# Patient Record
Sex: Female | Born: 1945 | Race: Black or African American | Hispanic: No | Marital: Married | Smoking: Former smoker
Health system: Southern US, Community
[De-identification: ages and names within clinical notes are randomized; demographics above are authoritative.]

## PROBLEM LIST (undated history)

## (undated) DIAGNOSIS — F209 Schizophrenia, unspecified: Secondary | ICD-10-CM

## (undated) DIAGNOSIS — C50919 Malignant neoplasm of unspecified site of unspecified female breast: Secondary | ICD-10-CM

## (undated) DIAGNOSIS — R499 Unspecified voice and resonance disorder: Secondary | ICD-10-CM

## (undated) DIAGNOSIS — F419 Anxiety disorder, unspecified: Secondary | ICD-10-CM

## (undated) DIAGNOSIS — I639 Cerebral infarction, unspecified: Secondary | ICD-10-CM

## (undated) DIAGNOSIS — F0390 Unspecified dementia without behavioral disturbance: Secondary | ICD-10-CM

## (undated) DIAGNOSIS — R251 Tremor, unspecified: Secondary | ICD-10-CM

## (undated) DIAGNOSIS — R51 Headache: Secondary | ICD-10-CM

## (undated) DIAGNOSIS — I2510 Atherosclerotic heart disease of native coronary artery without angina pectoris: Secondary | ICD-10-CM

## (undated) DIAGNOSIS — S2249XA Multiple fractures of ribs, unspecified side, initial encounter for closed fracture: Secondary | ICD-10-CM

## (undated) DIAGNOSIS — M2550 Pain in unspecified joint: Secondary | ICD-10-CM

## (undated) DIAGNOSIS — I10 Essential (primary) hypertension: Secondary | ICD-10-CM

## (undated) DIAGNOSIS — R1310 Dysphagia, unspecified: Secondary | ICD-10-CM

## (undated) DIAGNOSIS — J449 Chronic obstructive pulmonary disease, unspecified: Secondary | ICD-10-CM

## (undated) DIAGNOSIS — R0602 Shortness of breath: Secondary | ICD-10-CM

## (undated) DIAGNOSIS — F32A Depression, unspecified: Secondary | ICD-10-CM

## (undated) DIAGNOSIS — F1910 Other psychoactive substance abuse, uncomplicated: Secondary | ICD-10-CM

## (undated) DIAGNOSIS — G4733 Obstructive sleep apnea (adult) (pediatric): Secondary | ICD-10-CM

## (undated) DIAGNOSIS — S2239XA Fracture of one rib, unspecified side, initial encounter for closed fracture: Secondary | ICD-10-CM

## (undated) DIAGNOSIS — I509 Heart failure, unspecified: Secondary | ICD-10-CM

## (undated) DIAGNOSIS — R634 Abnormal weight loss: Secondary | ICD-10-CM

## (undated) DIAGNOSIS — R569 Unspecified convulsions: Secondary | ICD-10-CM

## (undated) DIAGNOSIS — I219 Acute myocardial infarction, unspecified: Secondary | ICD-10-CM

## (undated) DIAGNOSIS — F329 Major depressive disorder, single episode, unspecified: Secondary | ICD-10-CM

## (undated) HISTORY — DX: Tremor, unspecified: R25.1

## (undated) HISTORY — DX: Schizophrenia, unspecified: F20.9

## (undated) HISTORY — DX: Fracture of one rib, unspecified side, initial encounter for closed fracture: S22.39XA

## (undated) HISTORY — DX: Other psychoactive substance abuse, uncomplicated: F19.10

## (undated) HISTORY — DX: Malignant neoplasm of unspecified site of unspecified female breast: C50.919

## (undated) HISTORY — DX: Cerebral infarction, unspecified: I63.9

## (undated) HISTORY — DX: Atherosclerotic heart disease of native coronary artery without angina pectoris: I25.10

## (undated) HISTORY — DX: Abnormal weight loss: R63.4

## (undated) HISTORY — DX: Anxiety disorder, unspecified: F41.9

## (undated) HISTORY — PX: TOTAL ABDOMINAL HYSTERECTOMY: SHX209

## (undated) HISTORY — DX: Obstructive sleep apnea (adult) (pediatric): G47.33

## (undated) HISTORY — DX: Pain in unspecified joint: M25.50

## (undated) HISTORY — DX: Chronic obstructive pulmonary disease, unspecified: J44.9

## (undated) HISTORY — DX: Multiple fractures of ribs, unspecified side, initial encounter for closed fracture: S22.49XA

## (undated) HISTORY — DX: Dysphagia, unspecified: R13.10

## (undated) HISTORY — DX: Unspecified voice and resonance disorder: R49.9

## (undated) HISTORY — DX: Unspecified dementia, unspecified severity, without behavioral disturbance, psychotic disturbance, mood disturbance, and anxiety: F03.90

## (undated) HISTORY — DX: Acute myocardial infarction, unspecified: I21.9

## (undated) HISTORY — DX: Essential (primary) hypertension: I10

---

## 1997-08-28 ENCOUNTER — Emergency Department (HOSPITAL_COMMUNITY): Admission: EM | Admit: 1997-08-28 | Discharge: 1997-08-28 | Payer: Self-pay | Admitting: Emergency Medicine

## 1997-09-05 ENCOUNTER — Emergency Department (HOSPITAL_COMMUNITY): Admission: EM | Admit: 1997-09-05 | Discharge: 1997-09-05 | Payer: Self-pay | Admitting: Emergency Medicine

## 2008-10-05 ENCOUNTER — Emergency Department (HOSPITAL_COMMUNITY): Admission: EM | Admit: 2008-10-05 | Discharge: 2008-10-05 | Payer: Self-pay | Admitting: Emergency Medicine

## 2008-11-11 ENCOUNTER — Emergency Department (HOSPITAL_COMMUNITY): Admission: EM | Admit: 2008-11-11 | Discharge: 2008-11-11 | Payer: Self-pay | Admitting: Emergency Medicine

## 2008-11-12 ENCOUNTER — Inpatient Hospital Stay (HOSPITAL_COMMUNITY): Admission: EM | Admit: 2008-11-12 | Discharge: 2008-11-19 | Payer: Self-pay | Admitting: Emergency Medicine

## 2008-11-13 ENCOUNTER — Encounter (INDEPENDENT_AMBULATORY_CARE_PROVIDER_SITE_OTHER): Payer: Self-pay | Admitting: Internal Medicine

## 2008-11-18 ENCOUNTER — Encounter: Payer: Self-pay | Admitting: Cardiovascular Disease

## 2009-01-16 ENCOUNTER — Inpatient Hospital Stay (HOSPITAL_COMMUNITY): Admission: AD | Admit: 2009-01-16 | Discharge: 2009-01-21 | Payer: Self-pay | Admitting: Cardiology

## 2009-01-16 ENCOUNTER — Encounter: Payer: Self-pay | Admitting: Emergency Medicine

## 2009-01-21 ENCOUNTER — Ambulatory Visit: Payer: Self-pay | Admitting: Psychiatry

## 2009-01-21 ENCOUNTER — Inpatient Hospital Stay (HOSPITAL_COMMUNITY): Admission: AD | Admit: 2009-01-21 | Discharge: 2009-01-23 | Payer: Self-pay | Admitting: Psychiatry

## 2009-02-03 ENCOUNTER — Encounter (INDEPENDENT_AMBULATORY_CARE_PROVIDER_SITE_OTHER): Payer: Self-pay | Admitting: Nurse Practitioner

## 2009-02-03 ENCOUNTER — Encounter (INDEPENDENT_AMBULATORY_CARE_PROVIDER_SITE_OTHER): Payer: Self-pay | Admitting: Cardiology

## 2009-02-03 ENCOUNTER — Inpatient Hospital Stay (HOSPITAL_COMMUNITY): Admission: EM | Admit: 2009-02-03 | Discharge: 2009-02-07 | Payer: Self-pay | Admitting: Emergency Medicine

## 2009-03-21 ENCOUNTER — Inpatient Hospital Stay (HOSPITAL_COMMUNITY): Admission: EM | Admit: 2009-03-21 | Discharge: 2009-03-23 | Payer: Self-pay | Admitting: Emergency Medicine

## 2009-03-31 DIAGNOSIS — I2510 Atherosclerotic heart disease of native coronary artery without angina pectoris: Secondary | ICD-10-CM | POA: Insufficient documentation

## 2009-04-01 ENCOUNTER — Inpatient Hospital Stay (HOSPITAL_COMMUNITY): Admission: EM | Admit: 2009-04-01 | Discharge: 2009-04-03 | Payer: Self-pay | Admitting: Emergency Medicine

## 2009-04-01 LAB — CONVERTED CEMR LAB
BUN: 22 mg/dL
CO2: 25 meq/L
Calcium: 8.5 mg/dL
Chloride: 108 meq/L
Glucose, Bld: 111 mg/dL
HCT: 36.7 %
Hemoglobin: 12.5 g/dL
Potassium: 3.6 meq/L
WBC: 4.7 10*3/uL

## 2009-04-06 ENCOUNTER — Encounter (INDEPENDENT_AMBULATORY_CARE_PROVIDER_SITE_OTHER): Payer: Self-pay | Admitting: Nurse Practitioner

## 2009-04-06 ENCOUNTER — Ambulatory Visit: Payer: Self-pay | Admitting: Internal Medicine

## 2009-04-06 DIAGNOSIS — E785 Hyperlipidemia, unspecified: Secondary | ICD-10-CM

## 2009-04-06 DIAGNOSIS — F411 Generalized anxiety disorder: Secondary | ICD-10-CM | POA: Insufficient documentation

## 2009-04-06 DIAGNOSIS — I219 Acute myocardial infarction, unspecified: Secondary | ICD-10-CM | POA: Insufficient documentation

## 2009-04-06 DIAGNOSIS — K8020 Calculus of gallbladder without cholecystitis without obstruction: Secondary | ICD-10-CM | POA: Insufficient documentation

## 2009-04-06 DIAGNOSIS — J45909 Unspecified asthma, uncomplicated: Secondary | ICD-10-CM | POA: Insufficient documentation

## 2009-04-06 DIAGNOSIS — J4489 Other specified chronic obstructive pulmonary disease: Secondary | ICD-10-CM | POA: Insufficient documentation

## 2009-04-06 DIAGNOSIS — N179 Acute kidney failure, unspecified: Secondary | ICD-10-CM

## 2009-04-06 DIAGNOSIS — F1410 Cocaine abuse, uncomplicated: Secondary | ICD-10-CM | POA: Insufficient documentation

## 2009-04-06 DIAGNOSIS — I10 Essential (primary) hypertension: Secondary | ICD-10-CM

## 2009-04-06 DIAGNOSIS — F209 Schizophrenia, unspecified: Secondary | ICD-10-CM | POA: Insufficient documentation

## 2009-04-06 DIAGNOSIS — J449 Chronic obstructive pulmonary disease, unspecified: Secondary | ICD-10-CM

## 2009-04-13 DIAGNOSIS — K5900 Constipation, unspecified: Secondary | ICD-10-CM | POA: Insufficient documentation

## 2009-04-14 LAB — CONVERTED CEMR LAB
Albumin: 4.4 g/dL (ref 3.5–5.2)
Alkaline Phosphatase: 66 units/L (ref 39–117)
BUN: 15 mg/dL (ref 6–23)
CO2: 26 meq/L (ref 19–32)
Glucose, Bld: 73 mg/dL (ref 70–99)
Hemoglobin: 12.7 g/dL (ref 12.0–15.0)
MCHC: 31.6 g/dL (ref 30.0–36.0)
MCV: 97.6 fL (ref 78.0–100.0)
Microalb, Ur: 5.3 mg/dL — ABNORMAL HIGH (ref 0.00–1.89)
Potassium: 4.2 meq/L (ref 3.5–5.3)
RBC: 4.12 M/uL (ref 3.87–5.11)
Total Bilirubin: 0.4 mg/dL (ref 0.3–1.2)

## 2009-05-13 ENCOUNTER — Ambulatory Visit: Payer: Self-pay | Admitting: Nurse Practitioner

## 2009-05-14 ENCOUNTER — Encounter (INDEPENDENT_AMBULATORY_CARE_PROVIDER_SITE_OTHER): Payer: Self-pay | Admitting: Nurse Practitioner

## 2009-05-14 DIAGNOSIS — Z862 Personal history of diseases of the blood and blood-forming organs and certain disorders involving the immune mechanism: Secondary | ICD-10-CM

## 2009-05-14 DIAGNOSIS — Z8639 Personal history of other endocrine, nutritional and metabolic disease: Secondary | ICD-10-CM

## 2009-05-14 LAB — CONVERTED CEMR LAB
ALT: 20 units/L (ref 0–35)
AST: 41 units/L — ABNORMAL HIGH (ref 0–37)
Albumin: 4.1 g/dL (ref 3.5–5.2)
CO2: 22 meq/L (ref 19–32)
Calcium: 9.3 mg/dL (ref 8.4–10.5)
Chloride: 105 meq/L (ref 96–112)
Cholesterol: 198 mg/dL (ref 0–200)
Eosinophils Absolute: 0.1 10*3/uL (ref 0.0–0.7)
Lymphocytes Relative: 18 % (ref 12–46)
Lymphs Abs: 0.8 10*3/uL (ref 0.7–4.0)
MCV: 96.1 fL (ref 78.0–100.0)
Monocytes Relative: 10 % (ref 3–12)
Neutrophils Relative %: 70 % (ref 43–77)
Potassium: 4.6 meq/L (ref 3.5–5.3)
RBC: 3.89 M/uL (ref 3.87–5.11)
Sodium: 142 meq/L (ref 135–145)
Total Protein: 6.9 g/dL (ref 6.0–8.3)
WBC: 4.7 10*3/uL (ref 4.0–10.5)

## 2009-05-18 ENCOUNTER — Encounter (INDEPENDENT_AMBULATORY_CARE_PROVIDER_SITE_OTHER): Payer: Self-pay | Admitting: Nurse Practitioner

## 2009-05-18 ENCOUNTER — Telehealth (INDEPENDENT_AMBULATORY_CARE_PROVIDER_SITE_OTHER): Payer: Self-pay | Admitting: Nurse Practitioner

## 2009-05-24 ENCOUNTER — Emergency Department (HOSPITAL_COMMUNITY): Admission: EM | Admit: 2009-05-24 | Discharge: 2009-05-24 | Payer: Self-pay | Admitting: Emergency Medicine

## 2009-05-26 ENCOUNTER — Inpatient Hospital Stay (HOSPITAL_COMMUNITY): Admission: EM | Admit: 2009-05-26 | Discharge: 2009-05-29 | Payer: Self-pay | Admitting: Emergency Medicine

## 2009-05-26 DIAGNOSIS — I728 Aneurysm of other specified arteries: Secondary | ICD-10-CM | POA: Insufficient documentation

## 2009-05-26 DIAGNOSIS — I6340 Cerebral infarction due to embolism of unspecified cerebral artery: Secondary | ICD-10-CM | POA: Insufficient documentation

## 2009-05-27 ENCOUNTER — Ambulatory Visit: Payer: Self-pay | Admitting: Physical Medicine & Rehabilitation

## 2009-05-27 ENCOUNTER — Encounter (INDEPENDENT_AMBULATORY_CARE_PROVIDER_SITE_OTHER): Payer: Self-pay | Admitting: Neurology

## 2009-05-29 ENCOUNTER — Encounter (INDEPENDENT_AMBULATORY_CARE_PROVIDER_SITE_OTHER): Payer: Self-pay | Admitting: Nurse Practitioner

## 2009-06-01 ENCOUNTER — Encounter (INDEPENDENT_AMBULATORY_CARE_PROVIDER_SITE_OTHER): Payer: Self-pay | Admitting: Nurse Practitioner

## 2009-06-09 ENCOUNTER — Telehealth (INDEPENDENT_AMBULATORY_CARE_PROVIDER_SITE_OTHER): Payer: Self-pay | Admitting: Nurse Practitioner

## 2009-06-11 ENCOUNTER — Encounter (INDEPENDENT_AMBULATORY_CARE_PROVIDER_SITE_OTHER): Payer: Self-pay | Admitting: *Deleted

## 2009-06-19 ENCOUNTER — Telehealth (INDEPENDENT_AMBULATORY_CARE_PROVIDER_SITE_OTHER): Payer: Self-pay | Admitting: *Deleted

## 2009-06-22 ENCOUNTER — Encounter (INDEPENDENT_AMBULATORY_CARE_PROVIDER_SITE_OTHER): Payer: Self-pay | Admitting: Nurse Practitioner

## 2009-06-24 ENCOUNTER — Telehealth (INDEPENDENT_AMBULATORY_CARE_PROVIDER_SITE_OTHER): Payer: Self-pay | Admitting: Nurse Practitioner

## 2009-06-24 ENCOUNTER — Encounter (INDEPENDENT_AMBULATORY_CARE_PROVIDER_SITE_OTHER): Payer: Self-pay | Admitting: Nurse Practitioner

## 2009-06-26 ENCOUNTER — Telehealth (INDEPENDENT_AMBULATORY_CARE_PROVIDER_SITE_OTHER): Payer: Self-pay | Admitting: *Deleted

## 2009-07-04 ENCOUNTER — Emergency Department (HOSPITAL_COMMUNITY): Admission: EM | Admit: 2009-07-04 | Discharge: 2009-07-04 | Payer: Self-pay | Admitting: Emergency Medicine

## 2009-07-15 ENCOUNTER — Encounter (INDEPENDENT_AMBULATORY_CARE_PROVIDER_SITE_OTHER): Payer: Self-pay | Admitting: Nurse Practitioner

## 2009-07-23 ENCOUNTER — Encounter (INDEPENDENT_AMBULATORY_CARE_PROVIDER_SITE_OTHER): Payer: Self-pay | Admitting: Nurse Practitioner

## 2009-07-26 ENCOUNTER — Emergency Department (HOSPITAL_COMMUNITY): Admission: EM | Admit: 2009-07-26 | Discharge: 2009-07-26 | Payer: Self-pay | Admitting: Emergency Medicine

## 2009-08-03 ENCOUNTER — Telehealth (INDEPENDENT_AMBULATORY_CARE_PROVIDER_SITE_OTHER): Payer: Self-pay | Admitting: Nurse Practitioner

## 2009-08-03 ENCOUNTER — Encounter (INDEPENDENT_AMBULATORY_CARE_PROVIDER_SITE_OTHER): Payer: Self-pay | Admitting: Nurse Practitioner

## 2009-08-03 ENCOUNTER — Encounter (INDEPENDENT_AMBULATORY_CARE_PROVIDER_SITE_OTHER): Payer: Self-pay | Admitting: *Deleted

## 2009-08-04 ENCOUNTER — Encounter (INDEPENDENT_AMBULATORY_CARE_PROVIDER_SITE_OTHER): Payer: Self-pay | Admitting: Nurse Practitioner

## 2009-08-06 ENCOUNTER — Encounter (INDEPENDENT_AMBULATORY_CARE_PROVIDER_SITE_OTHER): Payer: Self-pay | Admitting: Nurse Practitioner

## 2009-08-06 ENCOUNTER — Inpatient Hospital Stay (HOSPITAL_COMMUNITY): Admission: EM | Admit: 2009-08-06 | Discharge: 2009-08-08 | Payer: Self-pay | Admitting: Emergency Medicine

## 2009-08-26 ENCOUNTER — Encounter (INDEPENDENT_AMBULATORY_CARE_PROVIDER_SITE_OTHER): Payer: Self-pay | Admitting: Nurse Practitioner

## 2009-09-21 ENCOUNTER — Emergency Department (HOSPITAL_COMMUNITY): Admission: EM | Admit: 2009-09-21 | Discharge: 2009-09-22 | Payer: Self-pay | Admitting: Emergency Medicine

## 2009-09-30 ENCOUNTER — Emergency Department (HOSPITAL_COMMUNITY): Admission: EM | Admit: 2009-09-30 | Discharge: 2009-09-30 | Payer: Self-pay | Admitting: Emergency Medicine

## 2009-10-06 ENCOUNTER — Emergency Department (HOSPITAL_COMMUNITY): Admission: EM | Admit: 2009-10-06 | Discharge: 2009-10-07 | Payer: Self-pay | Admitting: Emergency Medicine

## 2009-10-25 ENCOUNTER — Emergency Department (HOSPITAL_COMMUNITY): Admission: EM | Admit: 2009-10-25 | Discharge: 2009-10-26 | Payer: Self-pay | Admitting: Emergency Medicine

## 2009-11-06 ENCOUNTER — Emergency Department (HOSPITAL_COMMUNITY): Admission: EM | Admit: 2009-11-06 | Discharge: 2009-11-07 | Payer: Self-pay | Admitting: Emergency Medicine

## 2009-11-20 ENCOUNTER — Encounter (INDEPENDENT_AMBULATORY_CARE_PROVIDER_SITE_OTHER): Payer: Self-pay | Admitting: Nurse Practitioner

## 2009-11-21 ENCOUNTER — Inpatient Hospital Stay (HOSPITAL_COMMUNITY): Admission: EM | Admit: 2009-11-21 | Discharge: 2009-11-26 | Payer: Self-pay | Admitting: Emergency Medicine

## 2009-11-22 ENCOUNTER — Encounter (INDEPENDENT_AMBULATORY_CARE_PROVIDER_SITE_OTHER): Payer: Self-pay | Admitting: Cardiovascular Disease

## 2009-11-23 LAB — CONVERTED CEMR LAB
Cholesterol: 236 mg/dL
HDL: 136 mg/dL
LDL Cholesterol: 81 mg/dL
Total CHOL/HDL Ratio: 1.7
Triglycerides: 96 mg/dL
VLDL: 19 mg/dL

## 2009-11-24 LAB — CONVERTED CEMR LAB
HCT: 43.5 %
MCV: 98.4 fL
Platelets: 194 10*3/uL
RBC: 4.42 M/uL
WBC: 15.6 10*3/uL

## 2009-11-26 LAB — CONVERTED CEMR LAB
Calcium: 9.2 mg/dL
Glucose, Bld: 89 mg/dL
Potassium: 5.4 meq/L
Sodium: 135 meq/L

## 2009-12-25 ENCOUNTER — Ambulatory Visit: Payer: Self-pay | Admitting: Nurse Practitioner

## 2009-12-25 DIAGNOSIS — I509 Heart failure, unspecified: Secondary | ICD-10-CM | POA: Insufficient documentation

## 2010-01-09 ENCOUNTER — Ambulatory Visit: Payer: Self-pay | Admitting: Psychiatry

## 2010-01-13 ENCOUNTER — Inpatient Hospital Stay (HOSPITAL_COMMUNITY): Admission: EM | Admit: 2010-01-13 | Discharge: 2010-01-14 | Payer: Self-pay | Admitting: Emergency Medicine

## 2010-01-28 ENCOUNTER — Observation Stay (HOSPITAL_COMMUNITY): Admission: EM | Admit: 2010-01-28 | Discharge: 2010-01-09 | Payer: Self-pay | Admitting: Emergency Medicine

## 2010-02-02 ENCOUNTER — Emergency Department (HOSPITAL_COMMUNITY)
Admission: EM | Admit: 2010-02-02 | Discharge: 2010-02-02 | Payer: Self-pay | Source: Home / Self Care | Admitting: Emergency Medicine

## 2010-03-04 ENCOUNTER — Encounter (INDEPENDENT_AMBULATORY_CARE_PROVIDER_SITE_OTHER): Payer: Self-pay | Admitting: Nurse Practitioner

## 2010-03-08 ENCOUNTER — Emergency Department (HOSPITAL_COMMUNITY)
Admission: EM | Admit: 2010-03-08 | Discharge: 2010-03-08 | Payer: Self-pay | Source: Home / Self Care | Admitting: Emergency Medicine

## 2010-03-10 LAB — URINALYSIS, ROUTINE W REFLEX MICROSCOPIC
Bilirubin Urine: NEGATIVE
Hgb urine dipstick: NEGATIVE
Ketones, ur: NEGATIVE mg/dL
Nitrite: NEGATIVE
Protein, ur: NEGATIVE mg/dL
Specific Gravity, Urine: 1.018 (ref 1.005–1.030)
Urine Glucose, Fasting: NEGATIVE mg/dL
Urobilinogen, UA: 0.2 mg/dL (ref 0.0–1.0)
pH: 6 (ref 5.0–8.0)

## 2010-03-10 LAB — URINE MICROSCOPIC-ADD ON

## 2010-03-17 ENCOUNTER — Emergency Department (HOSPITAL_COMMUNITY)
Admission: EM | Admit: 2010-03-17 | Discharge: 2010-03-17 | Payer: Self-pay | Source: Home / Self Care | Admitting: Emergency Medicine

## 2010-03-17 LAB — URINE MICROSCOPIC-ADD ON

## 2010-03-17 LAB — URINALYSIS, ROUTINE W REFLEX MICROSCOPIC
Ketones, ur: NEGATIVE mg/dL
Protein, ur: NEGATIVE mg/dL
Specific Gravity, Urine: 1.017 (ref 1.005–1.030)
pH: 5.5 (ref 5.0–8.0)

## 2010-03-17 LAB — BASIC METABOLIC PANEL
BUN: 20 mg/dL (ref 6–23)
CO2: 26 mEq/L (ref 19–32)
Calcium: 9.8 mg/dL (ref 8.4–10.5)
Creatinine, Ser: 1.12 mg/dL (ref 0.4–1.2)
GFR calc non Af Amer: 49 mL/min — ABNORMAL LOW (ref >60)
Glucose, Bld: 85 mg/dL (ref 70–99)
Potassium: 4.1 mEq/L (ref 3.5–5.1)

## 2010-03-17 LAB — DIFFERENTIAL
Basophils Absolute: 0 10*3/uL (ref 0.0–0.1)
Lymphs Abs: 1 10*3/uL (ref 0.7–4.0)
Monocytes Absolute: 0.2 10*3/uL (ref 0.1–1.0)
Monocytes Relative: 6 % (ref 3–12)
Neutrophils Relative %: 65 % (ref 43–77)

## 2010-03-17 LAB — CBC
Hemoglobin: 14.6 g/dL (ref 12.0–15.0)
MCH: 29.9 pg (ref 26.0–34.0)
MCV: 97.5 fL (ref 78.0–100.0)
RBC: 4.89 MIL/uL (ref 3.87–5.11)
RDW: 13.8 % (ref 11.5–15.5)

## 2010-03-17 LAB — RAPID URINE DRUG SCREEN, HOSP PERFORMED: Cocaine: NOT DETECTED

## 2010-03-17 LAB — BRAIN NATRIURETIC PEPTIDE: Pro B Natriuretic peptide (BNP): 368 pg/mL — ABNORMAL HIGH (ref 0.0–100.0)

## 2010-03-19 LAB — URINE CULTURE
Colony Count: >=100000
Culture  Setup Time: 201201251852

## 2010-03-23 NOTE — Letter (Signed)
Summary: [facility]ORDERS  [facility]ORDERS   Imported By: Arta Bruce 07/15/2009 10:26:13  _____________________________________________________________________  External Attachment:    Type:   Image     Comment:   External Document

## 2010-03-23 NOTE — Assessment & Plan Note (Signed)
Summary: HTN/COPD   Vital Signs:  Patient profile:   65 year old female Menstrual status:  hysterectomy Weight:      95.9 pounds O2 Sat:      97 % on Room air Temp:     97.3 degrees F oral Pulse rate:   76 / minute Pulse rhythm:   regular Resp:     20 per minute BP sitting:   161 / 112  (left arm) Cuff size:   regular  Vitals Entered By: Levon Hedger (May 13, 2009 10:50 AM)  O2 Flow:  Room air  Serial Vital Signs/Assessments:  Comments: 11:59 AM P/F  150,  150,  150 By: Levon Hedger   CC: follow-up visit COPD/ HTN, Hypertension Management, Lipid Management Is Patient Diabetic? No Pain Assessment Patient in pain? no       Does patient need assistance? Functional Status Self care Ambulation Normal   CC:  follow-up visit COPD/ HTN, Hypertension Management, and Lipid Management.  History of Present Illness:  Pt into the office for follow up. Pt was seen in office on last month for initial visit  Medication review - pt still has the same pill bottles from january present with her today.  She clearly has not been taking her medications.  This provider sent refills on all medications to her pharmacy during her last visit.  Pt originally stated she went to the pharmacy and they did not have the prescription. This office called the pharmacy and all her refills were received pt pt never came to pick them up.  Pt then stated that she did not have money for all the medications so she purchased her inhaler and pain meds.  Mental health - pt has not established with the [facility] so she is not on any psych meds.    Social - lives in [place] with her sister  Hypertension History:      She denies headache, chest pain, and palpitations.  Pt is noncompliant with medications.        Positive major cardiovascular risk factors include female age 46 years old or older, hyperlipidemia, and hypertension.  Negative major cardiovascular risk factors include  non-tobacco-user status.        Positive history for target organ damage include ASHD (either angina/prior MI/prior CABG) and renal insufficiency.  Further assessment for target organ damage reveals no history of cardiac end-organ damage (CHF/LVH), stroke/TIA, or peripheral vascular disease.    Lipid Management History:      Positive NCEP/ATP III risk factors include female age 22 years old or older, hypertension, and ASHD (either angina/prior MI/prior CABG).  Negative NCEP/ATP III risk factors include non-tobacco-user status, no prior stroke/TIA, no peripheral vascular disease, and no history of aortic aneurysm.        The patient states that she knows about the "Therapeutic Lifestyle Change" diet.  Her compliance with the TLC diet is poor.       Medications Prior to Update: 1)  Spironolactone 25 Mg Tabs (Spironolactone) .... One Tablet By Mouth Daily At Noon 2)  Colace 100 Mg Caps (Docusate Sodium) .... One Tablet By Mouth Two Times A Day For Stools 3)  Carvedilol 3.125 Mg Tabs (Carvedilol) .... One Tablet By Mouth Two Times A Day With Breakfast and Dinner For Heart 4)  Aspir-Low 81 Mg Tbec (Aspirin) .... One Tablet By Mouth Daily After Breakfast 5)  Isosorbide Mononitrate Cr 30 Mg Xr24h-Tab (Isosorbide Mononitrate) .... One Tablet By Mouth Daily At [facility] 6)  Simvastatin  40 Mg Tabs (Simvastatin) .... One Tablet By Mouth Nightly 7)  Lisinopril 5 Mg Tabs (Lisinopril) .... One Tablet By Mouth Daily For Blood Pressure 8)  Ondansetron Hcl 4 Mg Tabs (Ondansetron Hcl) .... Take One Tablet Every 6 Hours As Needed *srikar Reddy 9)  Plavix 75 Mg Tabs (Clopidogrel Bisulfate) .... One Tablet By Mouth Daily For Circulation 10)  Tramadol Hcl 50 Mg Tabs (Tramadol Hcl) .... 2 Tablet By Mouth Two Times A Day As Needed For Pain 11)  Proventil Hfa 108 (90 Base) Mcg/act Aers (Albuterol Sulfate) .... Two Sprays Ever 6 Hours As Needed For Shortness of Breath 12)  Qvar 40 Mcg/act Aers (Beclomethasone Dipropionate)  .... 2 Puffs Twice Daily For Breathing 13)  Potassium Chloride Crys Cr 20 Meq Cr-Tabs (Potassium Chloride Crys Cr) .... One Tablet By Mouth Daily 14)  Spiriva Handihaler 18 Mcg Caps (Tiotropium Bromide Monohydrate)  Allergies (verified): 1)  ! Penicillin 2)  ! Codeine  Review of Systems General:  Denies fever. CV:  Denies chest pain or discomfort. Resp:  Complains of shortness of breath; denies cough; only using MDI for breathing.  Not using the spirva as ordered. GI:  Denies abdominal pain, nausea, and vomiting.  Physical Exam  General:  alert.   Head:  normocephalic.   Lungs:  decreased air movement Heart:  normal rate and regular rhythm.   Abdomen:  soft and non-tender.   Msk:  active ROM Neurologic:  alert & oriented X3.   Skin:  color normal.   Psych:  Oriented X3.     Impression & Recommendations:  Problem # 1:  HYPERTENSION, BENIGN ESSENTIAL (ICD-401.1) BP elevated but pt is not taking meds Her updated medication list for this problem includes:    Spironolactone 25 Mg Tabs (Spironolactone) ..... One tablet by mouth daily at noon    Carvedilol 3.125 Mg Tabs (Carvedilol) ..... One tablet by mouth two times a day with breakfast and dinner for heart    Lisinopril 5 Mg Tabs (Lisinopril) ..... One tablet by mouth daily for blood pressure  Orders: T-Basic Metabolic Panel [id number]) T-CBC w/Diff [id number])  Problem # 2:  COPD (ICD-496)  Her updated medication list for this problem includes:    Proventil Hfa 108 (90 Base) Mcg/act Aers (Albuterol sulfate) .Marland Kitchen..Marland Kitchen Two sprays ever 6 hours as needed for shortness of breath    Qvar 40 Mcg/act Aers (Beclomethasone dipropionate) .Marland Kitchen... 2 puffs twice daily for breathing    Spiriva Handihaler 18 Mcg Caps (Tiotropium bromide monohydrate)  Orders: Peak Flow Rate (94150) Pulse Oximetry (single measurment) (25366)  Problem # 3:  CAD (ICD-414.00)  Her updated medication list for this problem includes:    Spironolactone 25 Mg  Tabs (Spironolactone) ..... One tablet by mouth daily at noon    Carvedilol 3.125 Mg Tabs (Carvedilol) ..... One tablet by mouth two times a day with breakfast and dinner for heart    Aspir-low 81 Mg Tbec (Aspirin) ..... One tablet by mouth daily after breakfast    Isosorbide Mononitrate Cr 30 Mg Xr24h-tab (Isosorbide mononitrate) ..... One tablet by mouth daily at noon    Lisinopril 5 Mg Tabs (Lisinopril) ..... One tablet by mouth daily for blood pressure    Plavix 75 Mg Tabs (Clopidogrel bisulfate) ..... One tablet by mouth daily for circulation  Problem # 4:  SCHIZOPHRENIA (ICD-295.90) advised pt that she needs to establish with the [facility]  Complete Medication List: 1)  Spironolactone 25 Mg Tabs (Spironolactone) .... One tablet by mouth daily at  noon 2)  Colace 100 Mg Caps (Docusate sodium) .... One tablet by mouth two times a day for stools 3)  Carvedilol 3.125 Mg Tabs (Carvedilol) .... One tablet by mouth two times a day with breakfast and dinner for heart 4)  Aspir-low 81 Mg Tbec (Aspirin) .... One tablet by mouth daily after breakfast 5)  Isosorbide Mononitrate Cr 30 Mg Xr24h-tab (Isosorbide mononitrate) .... One tablet by mouth daily at noon 6)  Simvastatin 40 Mg Tabs (Simvastatin) .... One tablet by mouth nightly 7)  Lisinopril 5 Mg Tabs (Lisinopril) .... One tablet by mouth daily for blood pressure 8)  Ondansetron Hcl 4 Mg Tabs (Ondansetron hcl) .... Take one tablet every 6 hours as needed *srikar reddy 9)  Plavix 75 Mg Tabs (Clopidogrel bisulfate) .... One tablet by mouth daily for circulation 10)  Tramadol Hcl 50 Mg Tabs (Tramadol hcl) .... 2 tablet by mouth two times a day as needed for pain 11)  Proventil Hfa 108 (90 Base) Mcg/act Aers (Albuterol sulfate) .... Two sprays ever 6 hours as needed for shortness of breath 12)  Qvar 40 Mcg/act Aers (Beclomethasone dipropionate) .... 2 puffs twice daily for breathing 13)  Potassium Chloride Crys Cr 20 Meq Cr-tabs (Potassium  chloride crys cr) .... One tablet by mouth daily 14)  Spiriva Handihaler 18 Mcg Caps (Tiotropium bromide monohydrate)  Other Orders: T-Lipid Profile ([id number])  Hypertension Assessment/Plan:      [name] hypertensive risk group is category C: Target organ damage and/or diabetes.  Today's blood pressure is 161/112.  Her blood pressure goal is < 140/90.  Lipid Assessment/Plan:      Based on NCEP/ATP III, the patient's risk factor category is "history of coronary disease, peripheral vascular disease, cerebrovascular disease, or aortic aneurysm".  [name] lipid goals are as follows: Total cholesterol goal is 200; LDL cholesterol goal is 100; HDL cholesterol goal is 40; Triglyceride goal is 150.    Patient Instructions: 1)  Labs will be checked today and you will be notified of the results. 2)  Schedule an appointment with mental health. 3)  All your medications should be available at the pharmacy.  Go there to pick up the prescription.  It is very important that you get your medicatios 4)  Follow up in 6 weeks for breathing and heart.

## 2010-03-23 NOTE — Letter (Signed)
Summary: [facility]   Imported By: Arta Bruce 06/22/2009 15:16:01  _____________________________________________________________________  External Attachment:    Type:   Image     Comment:   External Document

## 2010-03-23 NOTE — Letter (Signed)
Summary: Discharge Summary  Discharge Summary   Imported By: Arta Bruce 08/21/2009 15:54:16  _____________________________________________________________________  External Attachment:    Type:   Image     Comment:   External Document

## 2010-03-23 NOTE — Letter (Signed)
Summary: *HSN Results Follow up  [facility]  [address] [place], [place] [address]   Phone: [phone]  Fax: [phone]      06/11/2009   EDOM SCHMUHL [address] [place], [place]  [address]   Dear  Ms. Keirstan Macnair,                            ____S.Drinkard,FNP   ____D. Gore,FNP       ____B. McPherson,MD   ____V. Rankins,MD    ____E. Mulberry,MD    ____N. Daphine Deutscher, FNP  ____D. Reche Dixon, MD    ____K. Philipp Deputy, MD    ____Other     This letter is to inform you that your recent test(s):  _______Pap Smear    _______Lab Test     _______X-ray    _______ is within acceptable limits  _______ requires a medication change  _______ requires a follow-up lab visit  _______ requires a follow-up visit with your provider   Comments:  We have tried to reach you at [phone].  Please contact the office at your earliest convenience.       _________________________________________________________ If you have any questions, please contact our office                     Sincerely,  Levon Hedger [facility]

## 2010-03-23 NOTE — Letter (Signed)
Summary: [facility]  [facility]   Imported By: Arta Bruce 06/17/2009 10:47:42  _____________________________________________________________________  External Attachment:    Type:   Image     Comment:   External Document

## 2010-03-23 NOTE — Progress Notes (Signed)
Summary: Needs f/u appt  Phone Note Outgoing Call   Summary of Call: pt need a f/u appt in this office I am aware that she is s/p a CVA and has been having home health,  PT and OT however I keep getting forms to complete on her behalf and have not seen the pt since her CVA schedule a f/u appt Initial call taken by: Lehman Prom FNP,  August 03, 2009 12:30 PM  Follow-up for Phone Call        called [phone] this line is disconnected or no longer in service will mail letter. Follow-up by: Levon Hedger,  August 03, 2009 5:00 PM

## 2010-03-23 NOTE — Letter (Signed)
Summary: [facility]//FAXED  [facility]//FAXED   Imported By: Arta Bruce 08/04/2009 15:30:42  _____________________________________________________________________  External Attachment:    Type:   Image     Comment:   External Document

## 2010-03-23 NOTE — Letter (Signed)
Summary: PLAN OF CARE  05/30/09 TO 07/28/09  PLAN OF CARE  05/30/09 TO 07/28/09   Imported By: Silvio Pate Stanislawscyk 08/26/2009 15:21:15  _____________________________________________________________________  External Attachment:    Type:   Image     Comment:   External Document

## 2010-03-23 NOTE — Progress Notes (Signed)
Summary: Need more blood  Phone Note From Other Clinic   Summary of Call: Tonya from [facility] called not enough blood to run chronic hep panel. Initial call taken by: Vesta Mixer CMA,  May 18, 2009 11:44 AM  Follow-up for Phone Call        ok. pt is a hard stick. will try to get next time Follow-up by: Lehman Prom FNP,  May 18, 2009 12:24 PM

## 2010-03-23 NOTE — Assessment & Plan Note (Signed)
Summary: NEW - Establish Care    Vital Signs:  Patient profile:   65 year old female Menstrual status:  hysterectomy Height:      61 inches Weight:      97.7 pounds BMI:     18.53 O2 Sat:      90 % on Room air Temp:     97.3 degrees F Pulse rate:   90 / minute Pulse rhythm:   regular Resp:     36 per minute BP sitting:   148 / 111  (left arm) Cuff size:   regular  Vitals Entered By: Esperanza Richters (April 06, 2009 10:46 AM)  O2 Flow:  Room air CC: Follow-up visit, asthma, Hypertension Management, Lipid Management Is Patient Diabetic? No Pain Assessment Patient in pain? no       Does patient need assistance? Functional Status Self care Ambulation Normal Comments Patient used nebulizer this morning at approximately 9:30 am. peak flow 1.100 2.110 3.110     Menstrual Status hysterectomy   CC:  Follow-up visit, asthma, Hypertension Management, and Lipid Management.  History of Present Illness:  Pt into the office to establish care. Hospital admission from 03/31/2009 to 04/04/2009 (hospital D/C reviewed)  Acute renal failure - crt 1.75 at discharge. renal u/s and abdominal and pelve CT done. ACE resumed at discharge  Right flank pain - thought to be musculoskeletal but pt does have cholelithiasis.    Cocaine abuse - positive screen on admission  Hx heart failure and MI - meds resumed at d/c  Social - Born in [place] but relocated to [place] at an early age.  She just returned here about 4 months ago and has been in and out of the hospital since her time in [place]. No established PCP since returning.  Pt has her medications in the office with her today however she has quit taking them because she thinks one of them is causing her to have a headache.  she was only given a 30 day supply on all her medications and needs refills Poor historian  Asthma History    Initial Asthma Severity Rating:    Age range: 12+ years    Symptoms: throughout the  day    Nighttime Awakenings: often 7/week    Interferes w/ normal activity: some limitations    Asthma Severity Assessment: Severe Persistent  Hypertension History:      She denies headache, chest pain, and palpitations.  She notes no problems with any antihypertensive medication side effects.  Pt has medications but is not taking them because she thinks it is causing a headache.        Positive major cardiovascular risk factors include female age 67 years old or older, hyperlipidemia, and hypertension.  Negative major cardiovascular risk factors include non-tobacco-user status.        Positive history for target organ damage include ASHD (either angina/prior MI/prior CABG) and renal insufficiency.  Further assessment for target organ damage reveals no history of cardiac end-organ damage (CHF/LVH), stroke/TIA, or peripheral vascular disease.    Lipid Management History:      Positive NCEP/ATP III risk factors include female age 48 years old or older, hypertension, and ASHD (either angina/prior MI/prior CABG).  Negative NCEP/ATP III risk factors include non-tobacco-user status, no prior stroke/TIA, and no peripheral vascular disease.       Habits & Providers  Alcohol-Tobacco-Diet     Alcohol drinks/day: 1     Alcohol Counseling: to STOP drinking     Alcohol  type: spirits     Tobacco Status: quit     Tobacco Counseling: to remain off tobacco products     Year Started: age 13     Year Quit: 2010  Exercise-Depression-Behavior     Does Patient Exercise: no     Drug Use: past     Drug Use Counseling: to remain off cocaine  Allergies (verified): 1)  ! Penicillin 2)  ! Codeine  Past History:  Past Surgical History: Hysterectomy at age 65  Family History: mother - breast cancer father - diabetes brother - MI  Social History: 1 children tobacco - quit in 2010 after smoking since age 73 ETOH - occasional drinking Drug use - cocaine use (history)Smoking Status:  quit Drug Use:   past Does Patient Exercise:  no  Review of Systems General:  Complains of loss of appetite. CV:  Denies chest pain or discomfort. Resp:  Complains of cough and shortness of breath; Used nebulizer this AM prior to presenting to the office.  Did NOT get spiriva as ordred. Neuro:  Complains of headaches; "one of my medications is causing her head to hurt". Psych:  Complains of mental problems; Pt has tried to establish at the [facility] but to no avail.  she was told there were no providers available when she attempted to establish..  Physical Exam  General:  alert.   Head:  normocephalic.   Lungs:  decreased air movement Heart:  normal rate and regular rhythm.   Abdomen:  normal bowel sounds.   Neurologic:  alert & oriented X3.     Impression & Recommendations:  Problem # 1:  RENAL FAILURE, ACUTE (ICD-584.9) will trend. pt will eventually need nephrology referral  Problem # 2:  CHOLELITHIASIS (ICD-574.20) if flank pain continues can refer to surgery  Problem # 3:  COPD (ICD-496) advised pt to get inhalers as ordered Her updated medication list for this problem includes:    Proventil Hfa 108 (90 Base) Mcg/act Aers (Albuterol sulfate) .Marland Kitchen..Marland Kitchen Two sprays ever 6 hours as needed for shortness of breath    Qvar 40 Mcg/act Aers (Beclomethasone dipropionate) .Marland Kitchen... 2 puffs twice daily for breathing    Spiriva Handihaler 18 Mcg Caps (Tiotropium bromide monohydrate)  Orders: Peak Flow Meter (Z6109)  Problem # 4:  SCHIZOPHRENIA (ICD-295.90) pt needs to establish at the [facility]. will have pt schedule and intake appt with Marchelle Folks Orders: Misc. Referral (Misc. Ref)  Complete Medication List: 1)  Spironolactone 25 Mg Tabs (Spironolactone) .... One tablet by mouth daily at noon 2)  Colace 100 Mg Caps (Docusate sodium) .... One tablet by mouth two times a day for stools 3)  Carvedilol 3.125 Mg Tabs (Carvedilol) .... One tablet by mouth two times a day with breakfast and dinner  for heart 4)  Aspir-low 81 Mg Tbec (Aspirin) .... One tablet by mouth daily after breakfast 5)  Isosorbide Mononitrate Cr 30 Mg Xr24h-tab (Isosorbide mononitrate) .... One tablet by mouth daily at noon 6)  Simvastatin 40 Mg Tabs (Simvastatin) .... One tablet by mouth nightly 7)  Lisinopril 5 Mg Tabs (Lisinopril) .... One tablet by mouth daily for blood pressure 8)  Ondansetron Hcl 4 Mg Tabs (Ondansetron hcl) .... Take one tablet every 6 hours as needed *srikar reddy 9)  Plavix 75 Mg Tabs (Clopidogrel bisulfate) .... One tablet by mouth daily for circulation 10)  Tramadol Hcl 50 Mg Tabs (Tramadol hcl) .... 2 tablet by mouth two times a day as needed for pain 11)  Proventil  Hfa 108 (90 Base) Mcg/act Aers (Albuterol sulfate) .... Two sprays ever 6 hours as needed for shortness of breath 12)  Qvar 40 Mcg/act Aers (Beclomethasone dipropionate) .... 2 puffs twice daily for breathing 13)  Potassium Chloride Crys Cr 20 Meq Cr-tabs (Potassium chloride crys cr) .... One tablet by mouth daily 14)  Spiriva Handihaler 18 Mcg Caps (Tiotropium bromide monohydrate)  Other Orders: Pulse Oximetry (single measurment) (86578) T-Comprehensive Metabolic Panel ([id number]) T-CBC w/Diff ([id number]) T-Urine Microalbumin w/creat. ratio [id number]) UA Dipstick w/o Micro (manual) (95638)  Asthma Management Plan    Asthma Severity: Severe Persistent    Personal best PEF: 110 liters/minute    Predicted PEF: 423 liters/minute    Working PEF: 423 liters/minute    Plan based on PEF formula: [employer] Zone: (Range: 340 to 420) SPIRIVA HANDIHALER 18 MCG CAPS QVAR 40 MCG/ACT AERS  Yellow Zone: PROVENTIL HFA 108 (90 BASE) MCG/ACT AERS  Red Zone:  Hypertension Assessment/Plan:      [name] hypertensive risk group is category C: Target organ damage and/or diabetes.  Today's blood pressure is 148/111.  Her blood pressure goal is < 140/90.  Lipid Assessment/Plan:      Based on NCEP/ATP  III, the patient's risk factor category is "history of coronary disease, peripheral vascular disease, cerebrovascular disease, or aortic aneurysm".  [name] lipid goals are as follows: Total cholesterol goal is 200; LDL cholesterol goal is 100; HDL cholesterol goal is 40; Triglyceride goal is 150.     Patient Instructions: 1)  Your labs will be checked today. 2)  Continue to take the medictions as written on the bottle 3)  Schedule an appointment at the [facility] - mental health 4)  Follow up in this office in 2 weeks with n.martin for copd and high blood pressure.  Do not eat before this visit so you can get fasting labs - lipids, cmp, cbc Prescriptions: QVAR 40 MCG/ACT AERS (BECLOMETHASONE DIPROPIONATE) 2 puffs twice daily for breathing  #1 x 3   Entered and Authorized by:   Lehman Prom FNP   Signed by:   Lehman Prom FNP on 04/06/2009   Method used:   [facility]        [facility] [id number] ([facility]       [address]       [place], [place]  [address]       Ph: [phone] or [phone]       Fax: [phone]   RxID:   [id number] PROVENTIL HFA 108 (90 BASE) MCG/ACT AERS (ALBUTEROL SULFATE) Two sprays ever 6 hours as needed for shortness of breath  #1 x 3   Entered and Authorized by:   Lehman Prom FNP   Signed by:   Lehman Prom FNP on 04/06/2009   Method used:   [facility]        [facility] [id number] ([facility]ail[facility]       [address]       [place], [place]  [address]       Ph: [phone] or [phone]       Fax: [phone]   RxID:   [id number] TRAMADOL HCL 50 MG TABS (TRAMADOL HCL) 2 tablet by mouth two times a day as needed for pain  #50 x 0   Entered and Authorized by:   Lehman Prom FNP   Signed by:   Lehman Prom FNP on 04/06/2009   Method used:   [facility]  [facility] [id number] ([facility]ail[facility]       [address]       [place], [place]  [address]       Ph: [phone] or  [phone]       Fax: [phone]   RxID:   [id number] ISOSORBIDE MONONITRATE CR 30 MG XR24H-TAB (ISOSORBIDE MONONITRATE) One tablet by mouth daily at noon  #30 x 5   Entered and Authorized by:   Lehman Prom FNP   Signed by:   Lehman Prom FNP on 04/06/2009   Method used:   [facility]        [facility] [id number] ([facility]ail[facility]       [address]       [place], [place]  [address]       Ph: [phone] or [phone]       Fax: [phone]   RxID:   [id number] COLACE 100 MG CAPS (DOCUSATE SODIUM) One tablet by mouth two times a day for stools  #60 x 5   Entered and Authorized by:   Lehman Prom FNP   Signed by:   Lehman Prom FNP on 04/06/2009   Method used:   [facility]        [facility] [id number]       [address]       [place], [place]  [address]       Ph: [phone] or [phone]       Fax: [phone]   RxID:   [id number] LISINOPRIL 5 MG TABS (LISINOPRIL) One tablet by mouth daily for blood pressure  #30 x 5   Entered and Authorized by:   Lehman Prom FNP   Signed by:   Lehman Prom FNP on 04/06/2009   Method used:   [facility]        [facility] (retail)       [address]       [place], [place]  [address]       Ph: [phone] or [phone]       Fax: [phone]   RxID:   [id number] SIMVASTATIN 40 MG TABS (SIMVASTATIN) One tablet by mouth nightly  #30 x 5   Entered and Authorized by:   Lehman Prom FNP   Signed by:   Lehman Prom FNP on 04/06/2009   Method used:   Elect[facility]        [facility] [id number]ail[facility]       [address]       [place], [place]  [address]       Ph: [phone] or [phone]       Fax: [phone]   RxID:   [id number] CARVEDILOL 3.125 MG TABS (CARVEDILOL) One tablet by mouth two times a day with breakfast and dinner for heart  #60 x 5   Entered and Authorized by:   Lehman Prom FNP   Signed by:    Lehman Prom FNP on 04/06/2009   Method used:   Elect[facility] to        [facility] (retail)       [address]       [place], [place]  [address]       Ph: [phone] or [phone]       Fax: [phone]   RxID:   [id number] SPIRONOLACTONE 25 MG TABS (SPIRONOLACTONE) One tablet by mouth daily at noon  #30 x 5   Entered and Authorized by:   Lehman Prom  FNP   Signed by:   Lehman Prom FNP on 04/06/2009   Method used:   [facility] to        [facility] (retail)       [address]       [place], [place]  [address]       Ph: [phone] or [phone]       Fax: [phone]   RxID:   [id number] PLAVIX 75 MG TABS (CLOPIDOGREL BISULFATE) One tablet by mouth daily for circulation  #30 x 5   Entered and Authorized by:   Lehman Prom FNP   Signed by:   Lehman Prom FNP on 04/06/2009   Method used:   Electron[facility] to        [facility] (retail)       [address]       [place], [place]  [address]       Ph: [phone] or [phone]       Fax: [phone]   RxID:   [id number] ASPIR-LOW 81 MG TBEC (ASPIRIN) One tablet by mouth daily after breakfast  #30 x 5   Entered and Authorized by:   Lehman Prom FNP   Signed by:   Lehman Prom FNP on 04/06/2009   Method used:   [facility]        [facility] [id number]ail)       [address]       [place], [place]  [address]       Ph: [phone] or [phone]       Fax: [phone]   RxID:   [id number]       Vital Signs:  Patient profile:   65 year old female Menstrual status:  hysterectomy Height:      61 inches Weight:      97.7 pounds BMI:     18.53 O2 Sat:      90 % Temp:     97.3 degrees F Pulse rate:   90 / minute Pulse rhythm:   regular Resp:     36 per minute BP sitting:   148 / 111  (left arm) Cuff size:   regular  Vitals Entered By: Esperanza Richters (April 06, 2009 10:46 AM)  O2 Flow:   Room air    CXR  Procedure date:  03/31/2009  Findings:      COPD/emphysema with cardiomegly  X-ray  Procedure date:  03/31/2009  Findings:      lumbar - cholelithiasis, atherosclerotic changes in the adominal aorta and common iliac arteries.  Diffuse osteopenia.  no acute osseous findings  Renal US  Procedure date:  03/31/2009  Findings:      no evidence of obstructive uropathy.  Bilateral echogenic kidneyus suggestive of chronic medical renal disease  CT Abdomen/Pelvis  Procedure date:  04/02/2009  Findings:      no acute abnormalities identified within the abdomen and pelvis.  Scattered diverticulosis along the distal transvers colon noted.  Prominent subcutaneous collateral vessels seen along chest and abdomen.  Mild bibasilar atelectasis noted.  cholelithiasis noted.  scattered coronary artery calcification and diffuse calcification noted along the abdominal aorta and its branches   CXR  Procedure date:  03/31/2009  Findings:      COPD/emphysema with cardiomegly  X-ray  Procedure date:  03/31/2009  Findings:      lumbar - cholelithiasis, atherosclerotic changes in the adominal aorta and common iliac arteries.  Diffuse osteopenia.  no acute osseous findings  Renal  US  Procedure date:  03/31/2009  Findings:      no evidence of obstructive uropathy.  Bilateral echogenic kidneyus suggestive of chronic medical renal disease  CT Abdomen/Pelvis  Procedure date:  04/02/2009  Findings:      no acute abnormalities identified within the abdomen and pelvis.  Scattered diverticulosis along the distal transvers colon noted.  Prominent subcutaneous collateral vessels seen along chest and abdomen.  Mild bibasilar atelectasis noted.  cholelithiasis noted.  scattered coronary artery calcification and diffuse calcification noted along the abdominal aorta and its branches

## 2010-03-23 NOTE — Progress Notes (Signed)
Summary: [facility]  Phone Note From Other Clinic   Summary of Call: Windell Moulding from [facility], called in today  because to renew physical therapy because now the pt has activity in her left leg.[phone] [facility] Initial call taken by: Manon Hilding,  Jun 24, 2009 9:11 AM  Follow-up for Phone Call        forward to N. Daphine Deutscher, FNP Follow-up by: Levon Hedger,  Jun 29, 2009 4:42 PM  Additional Follow-up for Phone Call Additional follow up Details #1::        called and left message for ruth i will speak with her directly when she returns the call which will likely be on Tuesday Additional Follow-up by: Lehman Prom FNP,  Jun 29, 2009 4:56 PM    Additional Follow-up for Phone Call Additional follow up Details #2::    spoke with Windell Moulding on last week and gave a verbal order for physical therapy Windell Moulding indicated that pt is progressing well in regards to occupational therapy which is being d/c'd.  since she is able to now spontaneously move her right extremity will renew PT order Follow-up by: Lehman Prom FNP,  Jul 08, 2009 7:41 PM

## 2010-03-23 NOTE — Letter (Signed)
Summary: CARE MANAGEMENT DEPART.  CARE MANAGEMENT DEPART.   Imported By: Arta Bruce 04/08/2009 14:19:42  _____________________________________________________________________  External Attachment:    Type:   Image     Comment:   External Document

## 2010-03-23 NOTE — Progress Notes (Signed)
Summary: 4th No show  Phone Note Call from Patient   Summary of Call: [name] this patient has reached their 4th no show..please review chart and you can call or have Steward Drone to call and see why patient havent made their appts.  Initial call taken by: Mikey College CMA,  Jun 26, 2009 10:50 AM  Follow-up for Phone Call        This pt has been in the hospital. I know that because i have signed orders from [facility] to [facility] services. You may look to see if the date of her hospitalizations coincide with dates she missed in this office Follow-up by: Lehman Prom FNP,  Jun 26, 2009 10:52 AM  Additional Follow-up for Phone Call Additional follow up Details #1::        [name] this particular patient has been in the hospital will you please correct the noshows. Thanks Rene Kocher Additional Follow-up by: Mikey College CMA,  Jul 02, 2009 11:10 AM    Additional Follow-up for Phone Call Additional follow up Details #2::    I TOOK THE 4 TH NO-SHOW OFF,IS THAT ALL THAT NEEDS TO BE REMOVED? Follow-up by: Arta Bruce,  Jul 03, 2009 9:11 AM  Additional Follow-up for Phone Call Additional follow up Details #3:: Details for Additional Follow-up Action Taken: Pt should be only at 3rd noshow.... Thanks.Marland KitchenMarland Kitchen.(please sign chart off when completed)......Marland KitchenMikey College CMA  Jul 06, 2009 2:54 PM

## 2010-03-23 NOTE — Letter (Signed)
Summary: PERSONAL CARE SERVICE  PERSONAL CARE SERVICE   Imported By: Arta Bruce 06/24/2009 09:55:20  _____________________________________________________________________  External Attachment:    Type:   Image     Comment:   External Document

## 2010-03-23 NOTE — Letter (Signed)
Summary: PT INFORMATION SHEET  PT INFORMATION SHEET   Imported By: Arta Bruce 05/28/2009 15:46:59  _____________________________________________________________________  External Attachment:    Type:   Image     Comment:   External Document

## 2010-03-23 NOTE — Progress Notes (Signed)
Summary: [facility] FA[facility]ED [facility] FORM  Phone Note Other Incoming Call back at Home Phone [phone]   Caller: [facility] Reason for Call: Get patient information Summary of Call: MARTIN PT. FARAH FROM [facility] CALLED TO CHECK ON  A FORM THAT SHE FAXED OVER FOR [place] TO GET A WHEEL CHAIR CUSHION AND TO SEE HOW MUCH LONGER, BECAUSE SHE NEEDS IT BACK AS SOON AS POSSIBLE.   THE FORM IS IN YOUR REFILL SLOTS. Initial call taken by: Leodis Rains,  June 19, 2009 2:48 PM  Follow-up for Phone Call        forward to N. Daphine Deutscher, FNP Follow-up by: Levon Hedger,  Jun 22, 2009 8:44 AM  Additional Follow-up for Phone Call Additional follow up Details #1::        order signed fax back to [facility]  scan in EMR Additional Follow-up by: Lehman Prom FNP,  Jun 22, 2009 1:17 PM    Additional Follow-up for Phone Call Additional follow up Details #2::    FAXED AND SCANNED FORM Follow-up by: Arta Bruce,  Jun 22, 2009 3:24 PM

## 2010-03-23 NOTE — Letter (Signed)
Summary: NOTICE OF DECISION ON INITIAL REQUEST FOR MEDICAID SERVICES  NOTICE OF DECISION ON INITIAL REQUEST FOR MEDICAID SERVICES   Imported By: Arta Bruce 07/30/2009 12:49:03  _____________________________________________________________________  External Attachment:    Type:   Image     Comment:   External Document

## 2010-03-23 NOTE — Assessment & Plan Note (Signed)
Summary: F/U Hospital Visit   Vital Signs:  Patient profile:   65 year old female Menstrual status:  hysterectomy Weight:      93.25 pounds O2 Sat:      95 % Temp:     96.4 degrees F oral Pulse rate:   76 / minute Pulse rhythm:   regular Resp:     18 per minute BP sitting:   122 / 78  (right arm)  Vitals Entered By: Hale Drone CMA (December 25, 2009 3:33 PM) CC: Pt. is here for a f/u from the hosp. for SOB in September. Pt. has some Ashtma and Bronchitis concerns. Also is having some sleeping difficulites. , Hypertension Management, Lipid Management, CHF Management Is Patient Diabetic? No Pain Assessment Patient in pain? no       Does patient need assistance? Functional Status Social activities Ambulation Impaired:Risk for fall Comments PK Flow: 80, 110, 85   CC:  Pt. is here for a f/u from the hosp. for SOB in September. Pt. has some Ashtma and Bronchitis concerns. Also is having some sleeping difficulites. , Hypertension Management, Lipid Management, and CHF Management.  History of Present Illness:  Pt into the office for routine f/u Last visit 11/21/2009 to 11/26/2009 (full hospital D/C reviewed)  1. Non-ST elevation myocardial infarction - Seen in consult with cardiology.  She did rule non-ST elevation MI. This is thought to be secondary to vasospasm to cocaine.   Pt was started beta-blocker therapy, ACE inhibitor continued on lasix as per cardiology.   Pt was advised to resume ASA and plavix.  Cardiology has recommended management only Pt seen by Dr. Sharyn Lull  2.  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  Lasix decreased to once per day.  she is on beta blocker therapy as well as ACE inhibitor therapy and AS Admission BNP 968  3.  Mild chronic obstructive pulmonary disease exacerbation, resolving. - took prednisone as outpt continue on spiriva and albuterol limitation with ambulation inside the  house pt has completed her supply on inhalers  4.  Hyperkalemia - resolved.  Pt has adactone is on hold until the followup in the outpt setting.  Will recheck it today  5.  Acute renal failure superimposed on chronic kidney disease stage III - stable on d/c  6.  Casual expressions, suicidal thoughts, without serious suicidal ideation  7.  Psychotic disorder - seen in consult with psychiatry.  not a danger to herself. Pt was started on zyrprexa.  8.  Cocaine abuse - pt counseled during the hospital to d/c substances  Pt presents today with daughter  Hypertension History:      Positive major cardiovascular risk factors include female age 49 years old or older, hyperlipidemia, and hypertension.  Negative major cardiovascular risk factors include no history of diabetes and non-tobacco-user status.        Positive history for target organ damage include ASHD (either angina/prior MI/prior CABG), cardiac end organ damage (either CHF or LVH), prior stroke (or TIA), peripheral vascular disease, and renal insufficiency.    Lipid Management History:      Positive NCEP/ATP III risk factors include female age 33 years old or older, hypertension, ASHD (either angina/prior MI/prior CABG), prior stroke (or TIA), and peripheral vascular disease.  Negative NCEP/ATP III risk factors include non-diabetic, HDL cholesterol greater than 60, non-tobacco-user status, and no history of aortic aneurysm.        The patient states that  she knows about the "Therapeutic Lifestyle Change" diet.  Her compliance with the TLC diet is poor.  The patient does not know about adjunctive measures for cholesterol lowering.  Adjunctive measures started by the patient include ASA.       Habits & Providers  Alcohol-Tobacco-Diet     Alcohol drinks/day: 1     Alcohol Counseling: to STOP drinking     Alcohol type: spirits     Tobacco Status: quit     Tobacco Counseling: to remain off tobacco products     Year Started: age 24      Year Quit: 2010  Exercise-Depression-Behavior     Does Patient Exercise: no     Drug Use: cocaine  Allergies (verified): 1)  ! Penicillin 2)  ! Codeine  Social History: Drug Use:  cocaine  Review of Systems General:  Complains of sleep disorder; denies fever. CV:  Denies chest pain or discomfort. Resp:  Denies cough. GI:  Denies abdominal pain, nausea, and vomiting. MS:  Complains of low back pain.  Physical Exam  General:  alert.   Head:  normocephalic.   Lungs:  decreased air movement Heart:  normal rate and regular rhythm.   Msk:  left lower extremity Neurologic:  walker use left upper extremity +4 right upper extremity +5   Impression & Recommendations:  Problem # 1:  HYPERTENSION, BENIGN ESSENTIAL (ICD-401.1) Bp is stable reviewed DASH diet continue current meds Her updated medication list for this problem includes:    Spironolactone 25 Mg Tabs (Spironolactone) ..... Hold    Carvedilol 3.125 Mg Tabs (Carvedilol) ..... One tablet by mouth two times a day with breakfast and dinner for heart    Lisinopril 10 Mg Tabs (Lisinopril) ..... One tablet by mouth daily for blood pressure    Furosemide 40 Mg Tabs (Furosemide) ..... One tablet by mouth daily for fluid  Problem # 2:  COPD (ICD-496)  The following medications were removed from the medication list:    Qvar 40 Mcg/act Aers (Beclomethasone dipropionate) .Marland Kitchen... 2 puffs twice daily for breathing Her updated medication list for this problem includes:    Proventil Hfa 108 (90 Base) Mcg/act Aers (Albuterol sulfate) .Marland Kitchen..Marland Kitchen Two sprays ever 6 hours as needed for shortness of breath    Spiriva Handihaler 18 Mcg Caps (Tiotropium bromide monohydrate) ..... One capsule by inhalation daily  Problem # 3:  HYPERLIPIDEMIA (ICD-272.4)  Her updated medication list for this problem includes:    Simvastatin 10 Mg Tabs (Simvastatin) ..... One tablet by mouth nightly for cholesterol  Problem # 4:  SCHIZOPHRENIA  (ICD-295.90)  Problem # 5:  RENAL FAILURE, ACUTE (ICD-584.9) will check labs today  Orders: T-Basic Metabolic Panel [phone])  Complete Medication List: 1)  Spironolactone 25 Mg Tabs (Spironolactone) .... Hold 2)  Colace 100 Mg Caps (Docusate sodium) .... One tablet by mouth two times a day for stools 3)  Carvedilol 3.125 Mg Tabs (Carvedilol) .... One tablet by mouth two times a day with breakfast and dinner for heart 4)  Aspir-low 81 Mg Tbec (Aspirin) .... One tablet by mouth daily after breakfast 5)  Isosorbide Mononitrate Cr 30 Mg Xr24h-tab (Isosorbide mononitrate) .... One tablet by mouth daily at noon 6)  Simvastatin 10 Mg Tabs (Simvastatin) .... One tablet by mouth nightly for cholesterol 7)  Lisinopril 10 Mg Tabs (Lisinopril) .... One tablet by mouth daily for blood pressure 8)  Plavix 75 Mg Tabs (Clopidogrel bisulfate) .... One tablet by mouth daily for circulation 9)  Proventil Hfa  108 (90 Base) Mcg/act Aers (Albuterol sulfate) .... Two sprays ever 6 hours as needed for shortness of breath 10)  Potassium Chloride Crys Cr 20 Meq Cr-tabs (Potassium chloride crys cr) .... One tablet by mouth daily 11)  Spiriva Handihaler 18 Mcg Caps (Tiotropium bromide monohydrate) .... One capsule by inhalation daily 12)  Nitro-dur 0.4 Mg/hr Pt24 (Nitroglycerin) .... Take on tablet at onset of chest pain, may repeat every 5 minutes up to 3 tablets 13)  Zyprexa 2.5 Mg Tabs (Olanzapine) .... One tablet by mouth daily  ** rx by mental health** 14)  Trazodone Hcl 50 Mg Tabs (Trazodone hcl) .... One tablet by mouth nightly as needed for sleep 15)  Furosemide 40 Mg Tabs (Furosemide) .... One tablet by mouth daily for fluid 16)  Omeprazole 20 Mg Tbec (Omeprazole) .... One tablet by mouth daily in the morning before breakfast  Other Orders: Pulse Oximetry (single measurment) (16606)  CHF Assessment/Plan:      [name] current weight is 93.25 pounds.  Her previous weight was 95.9 pounds.     Prior Cardiac Test Results:  Echocardiogram Report:    Date:     11/22/2009    Results:   LVF 15-20% Systolic function is severely reduced.  Regional wall motion abnormalities with severe hypokinesis of the entire myocardium.  Grade 1 diastolic dysfunction   Hypertension Assessment/Plan:      [name] hypertensive risk group is category C: Target organ damage and/or diabetes.  Today's blood pressure is 122/78.  Her blood pressure goal is < 140/90.  Lipid Assessment/Plan:      Based on NCEP/ATP III, the patient's risk factor category is "history of coronary disease, peripheral vascular disease, cerebrovascular disease, or aortic aneurysm".  [name] lipid goals are as follows: Total cholesterol goal is 200; LDL cholesterol goal is 100; HDL cholesterol goal is 40; Triglyceride goal is 150.    Patient Instructions: 1)  Make an appointment at the [facility] - they can give you medication for mood and sleep 2)  continue all your heart and breathing medications as ordered. 3)  Labs will be done today to check your kidneys and you will be notified of the results 4)  Follow up in 6 weeks for breathing and high blood pressure. 5)  will need 02 sat, peak flow Prescriptions: PLAVIX 75 MG TABS (CLOPIDOGREL BISULFATE) One tablet by mouth daily for circulation  #30 x 3   Entered and Authorized by:   Lehman Prom FNP   Signed by:   Lehman Prom FNP on 12/25/2009   Method used:   Print then Give to Patient   RxID:   [id number] SPIRIVA HANDIHALER 18 MCG CAPS (TIOTROPIUM BROMIDE MONOHYDRATE) One capsule by inhalation daily  #1 month qs x 3   Entered and Authorized by:   Lehman Prom FNP   Signed by:   Lehman Prom FNP on 12/25/2009   Method used:   Print then Give to Patient   RxID:   [id number] OMEPRAZOLE 20 MG TBEC (OMEPRAZOLE) One tablet by mouth daily in the morning before breakfast  #30 x 3   Entered and Authorized by:   Lehman Prom FNP   Signed by:    Lehman Prom FNP on 12/25/2009   Method used:   Print then Give to Patient   RxID:   [id number] LISINOPRIL 10 MG TABS (LISINOPRIL) One tablet by mouth daily for blood pressure  #30 x 3   Entered and Authorized by:   Lehman Prom FNP  Signed by:   Lehman Prom FNP on 12/25/2009   Method used:   Print then Give to Patient   RxID:   [id number] ISOSORBIDE MONONITRATE CR 30 MG XR24H-TAB (ISOSORBIDE MONONITRATE) One tablet by mouth daily at noon  #30 x 3   Entered and Authorized by:   Lehman Prom FNP   Signed by:   Lehman Prom FNP on 12/25/2009   Method used:   Print then Give to Patient   RxID:   [id number] FUROSEMIDE 40 MG TABS (FUROSEMIDE) One tablet by mouth daily for fluid  #30 x 3   Entered and Authorized by:   Lehman Prom FNP   Signed by:   Lehman Prom FNP on 12/25/2009   Method used:   Print then Give to Patient   RxID:   [id number] SIMVASTATIN 10 MG TABS (SIMVASTATIN) One tablet by mouth nightly for cholesterol  #30 x 3   Entered and Authorized by:   Lehman Prom FNP   Signed by:   Lehman Prom FNP on 12/25/2009   Method used:   Print then Give to Patient   RxID:   [id number] TRAZODONE HCL 50 MG TABS (TRAZODONE HCL) One tablet by mouth nightly as needed for sleep  #30 x 0   Entered and Authorized by:   Lehman Prom FNP   Signed by:   Lehman Prom FNP on 12/25/2009   Method used:   Print then Give to Patient   RxID:   [id number] CARVEDILOL 3.125 MG TABS (CARVEDILOL) One tablet by mouth two times a day with breakfast and dinner for heart  #60 x 3   Entered and Authorized by:   Lehman Prom FNP   Signed by:   Lehman Prom FNP on 12/25/2009   Method used:   Print then Give to Patient   RxID:   [id number] PROVENTIL HFA 108 (90 BASE) MCG/ACT AERS (ALBUTEROL SULFATE) Two sprays ever 6 hours as needed for shortness of breath  #1 x 3   Entered and Authorized by:   Lehman Prom FNP   Signed by:    Lehman Prom FNP on 12/25/2009   Method used:   Print then Give to Patient   RxID:   [id number]    Orders Added: 1)  Est. Patient Level IV [12878] 2)  T-Basic Metabolic Panel [67672-09470] 3)  Pulse Oximetry (single measurment) [96283]

## 2010-03-23 NOTE — Letter (Signed)
Summary: [facility]//MAILED  [facility]//MAILED   Imported By: Arta Bruce 08/03/2009 16:54:22  _____________________________________________________________________  External Attachment:    Type:   Image     Comment:   External Document

## 2010-03-23 NOTE — Letter (Signed)
Summary: [facility]  [facility] /DISCHARGE   Imported By: Arta Bruce 08/06/2009 09:38:57  _____________________________________________________________________  External Attachment:    Type:   Image     Comment:   External Document

## 2010-03-23 NOTE — Letter (Signed)
Summary: [facility] /DENIED  [facility] /DENIED   Imported By: Arta Bruce 11/20/2009 11:11:34  _____________________________________________________________________  External Attachment:    Type:   Image     Comment:   External Document

## 2010-03-23 NOTE — Progress Notes (Signed)
Summary: tramadol refill  Phone Note From Other Clinic   Summary of Call: [facility] from [facility] called stating pt needs new rx for tramadol to [facility] [address].  Please call pt's sister Lilly at  [phone].  Also, some confusion about new meds, since pt's d/c from the hospital on 4/8.   Initial call taken by: Vesta Mixer CMA,  June 09, 2009 2:39 PM  Follow-up for Phone Call        [facility] [address] but what about the rest of her meds.  she had NOT picked those up the last time she was in this office. she needs her breathing medications and blood pressure medications.  All refills [facility].  If she needs these can be transferred from one [facility] to another they would just need to know to do so from her Follow-up by: Lehman Prom FNP,  June 09, 2009 2:49 PM  Additional Follow-up for Phone Call Additional follow up Details #1::        Called [phone] the number you have dialed is incorrect.  Will mail letter. Levon Hedger  June 09, 2009 5:36 PM      Prescriptions: TRAMADOL HCL 50 MG TABS (TRAMADOL HCL) 2 tablet by mouth two times a day as needed for pain  #50 x 0   Entered and Authorized by:   Lehman Prom FNP   Signed by:   Lehman Prom FNP on 06/09/2009   Method used:   [facility]ronically to        [facility]  [address] [phone][facility] (retail)       [address]       [place], [place]  [id number]       Ph: [phone] or [phone]       Fax: [phone]   RxID:   [id number]

## 2010-03-23 NOTE — Letter (Signed)
Summary: *HSN Results Follow up  [facility]  [address] [place], [place] [address]   Phone: [phone]  Fax: [phone]      05/18/2009   TASHENA IBACH [address] [place], [place]  [address]   Dear  Ms. Corin Silva,                            ____S.Drinkard,FNP   ____D. Gore,FNP       ____B. McPherson,MD   ____V. Rankins,MD    ____E. Mulberry,MD    __X__N. Daphine Deutscher, FNP  ____D. Reche Dixon, MD    ____K. Philipp Deputy, MD    ____Other     This letter is to inform you that your recent test(s):  _______Pap Smear    ___X____Lab Test     _______X-ray    Comments: Labs done during your recent office visit show that your liver labs are high.  Avoid any alcohol use. Your kidney labs are slowly improving as compared with labs done while in the hospital.       _________________________________________________________ If you have any questions, please contact our office [phone].                    Sincerely,    Lehman Prom FNP [facility]

## 2010-03-23 NOTE — Letter (Signed)
Summary: *HSN Results Follow up  [facility]  [address] [place], [place] [address]   Phone: [phone]  Fax: [phone]      08/03/2009   AMZIE SILLAS [address] [place], [place]  [address]   Dear  Ms. Marrian Creek,                            ____S.Drinkard,FNP   ____D. Gore,FNP       ____B. McPherson,MD   ____V. Rankins,MD    ____E. Mulberry,MD    ____N. Daphine Deutscher, FNP  ____D. Reche Dixon, MD    ____K. Philipp Deputy, MD    ____Other     This letter is to inform you that your recent test(s):  _______Pap Smear    _______Lab Test     _______X-ray    _______ is within acceptable limits  _______ requires a medication change  _______ requires a follow-up lab visit  ___X____ requires a follow-up visit with your provider   Comments:  Please contact the office for a follow-up appointment at your earliest convenience.       _________________________________________________________ If you have any questions, please contact our office                     Sincerely,  Levon Hedger [facility]

## 2010-03-24 ENCOUNTER — Encounter (INDEPENDENT_AMBULATORY_CARE_PROVIDER_SITE_OTHER): Payer: Self-pay | Admitting: Nurse Practitioner

## 2010-03-25 NOTE — Letter (Signed)
Summary: [facility]  [facility]   Imported By: Arta Bruce 03/05/2010 15:01:24  _____________________________________________________________________  External Attachment:    Type:   Image     Comment:   External Document

## 2010-03-31 ENCOUNTER — Emergency Department (HOSPITAL_COMMUNITY)
Admission: EM | Admit: 2010-03-31 | Discharge: 2010-03-31 | Disposition: A | Discharge status: Home / Self Care | Payer: Medicaid Other | Attending: Emergency Medicine | Admitting: Emergency Medicine

## 2010-03-31 ENCOUNTER — Emergency Department (HOSPITAL_COMMUNITY): Payer: Medicaid Other

## 2010-03-31 DIAGNOSIS — Z8673 Personal history of transient ischemic attack (TIA), and cerebral infarction without residual deficits: Secondary | ICD-10-CM | POA: Insufficient documentation

## 2010-03-31 DIAGNOSIS — R5381 Other malaise: Secondary | ICD-10-CM | POA: Insufficient documentation

## 2010-03-31 DIAGNOSIS — I2510 Atherosclerotic heart disease of native coronary artery without angina pectoris: Secondary | ICD-10-CM | POA: Insufficient documentation

## 2010-03-31 DIAGNOSIS — I252 Old myocardial infarction: Secondary | ICD-10-CM | POA: Insufficient documentation

## 2010-03-31 DIAGNOSIS — M5030 Other cervical disc degeneration, unspecified cervical region: Secondary | ICD-10-CM | POA: Insufficient documentation

## 2010-03-31 DIAGNOSIS — M549 Dorsalgia, unspecified: Secondary | ICD-10-CM | POA: Insufficient documentation

## 2010-03-31 LAB — BASIC METABOLIC PANEL
BUN: 19 mg/dL (ref 6–23)
Calcium: 9.1 mg/dL (ref 8.4–10.5)
Creatinine, Ser: 1.58 mg/dL — ABNORMAL HIGH (ref 0.4–1.2)
GFR calc Af Amer: 40 mL/min — ABNORMAL LOW (ref >60)
GFR calc non Af Amer: 33 mL/min — ABNORMAL LOW (ref >60)

## 2010-03-31 LAB — URINALYSIS, ROUTINE W REFLEX MICROSCOPIC
Hgb urine dipstick: NEGATIVE
Ketones, ur: NEGATIVE mg/dL
Protein, ur: NEGATIVE mg/dL
Urine Glucose, Fasting: NEGATIVE mg/dL
Urobilinogen, UA: 0.2 mg/dL (ref 0.0–1.0)

## 2010-03-31 LAB — CBC
MCH: 31.6 pg (ref 26.0–34.0)
MCHC: 33.1 g/dL (ref 30.0–36.0)
MCV: 95.5 fL (ref 78.0–100.0)
Platelets: 193 10*3/uL (ref 150–400)
RDW: 13.2 % (ref 11.5–15.5)

## 2010-03-31 LAB — DIFFERENTIAL
Basophils Relative: 1 % (ref 0–1)
Eosinophils Absolute: 0.2 10*3/uL (ref 0.0–0.7)
Eosinophils Relative: 5 % (ref 0–5)
Lymphs Abs: 0.9 10*3/uL (ref 0.7–4.0)
Monocytes Relative: 9 % (ref 3–12)

## 2010-04-08 NOTE — Letter (Signed)
Summary: [facility]//NO SHOW  [facility]//NO SHOW   Imported By: Arta Bruce 03/30/2010 10:41:01  _____________________________________________________________________  External Attachment:    Type:   Image     Comment:   External Document

## 2010-04-13 NOTE — H&P (Signed)
NAME:  [name], [name]              ACCOUNT NO.:  [id number]  MEDICAL RECORD NO.:  [id number]          PATIENT TYPE:  OBV  LOCATION:  6735                         FACILITY:  [facility]  PHYSICIAN:  Lucile Crater, MD         DATE OF BIRTH:  Jun 15, 1945  DATE OF ADMISSION:  01/07/2010 DATE OF DISCHARGE:                             HISTORY & PHYSICAL   CHIEF COMPLAINT:  Shortness of breath.  HISTORY OF PRESENT ILLNESS:  The patient is a very pleasant 65 year old African American female who was recently discharged from the hospital on November 26, 2009 when she presented with acute shortness of breath and at that time she was found to have non-ST elevation myocardial infarction and acute on chronic combined systolic and diastolic congestive heart failure.  This was felt to be secondary to vasospasm secondary to cocaine and she was recommended to resume her aspirin and Plavix.  Her acute on chronic combined systolic and diastolic heart failure was well compensated by the time of discharge.  She reportedly was doing well until today when she noticed sudden onset of shortness of breath.  She states that she has had increased wheezing.  Reportedly she ran out of her albuterol and Spiriva inhalers at home and so she called [facility] and they gave her a breathing treatment.  She felt better and so they left. Then the patient attempted to go to bed and then the shortness of breath recurred and so she called [facility] again and was brought to the ER for further evaluation and management.  She states that she had multiple prior episodes like this in the past.  She denies having any chest pain at this time.  She has a chronic nonproductive cough with no change. She denies having any fever or chills.  She currently feels that she is back to her baseline.  She denies having had any new pets or fumes around her lately.  She does report orthopnea and paroxysmal nocturnal dyspnea.  She denies having any leg swelling or  increased weight.  She does report tightness all across the chest.  ALLERGIES:  PENICILLIN AND CODEINE.  REVIEW OF SYSTEMS:  A complete review of systems was done which included general, head, eyes, ears, nose, throat, cardiovascular, respiratory, GI, GU, endocrine, musculoskeletal, skin, neurologic and psychiatric all within normal limits other than what is mentioned in the history of present illness.  PAST MEDICAL HISTORY: 1. COPD. 2. Hypertension. 3. Cocaine abuse. 4. 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 5. History of breast cancer. 6. Non-ST elevation MI presumed to be secondary to cocaine use. 7. Renal insufficiency. 8. Status post hysterectomy. 9. Hyperlipidemia. 10.Childhood history of grand mal seizures. 11.Coronary artery disease. 12.Left bundle branch block. 13.Dilated cardiomyopathy. 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  CURRENT MEDICATIONS AT HOME: 1. Ventolin HFA four times a day as needed. 2. Simvastatin 10 mg once a day. 3. Lorazepam 2 mg q.h.s. p.r.n. 4. Imdur 30 mg once a day. 5. Lisinopril 10 mg once a day. 6. Lasix 40 mg once a day. 7. Coreg 3.125 mg b.i.d. 8. Plavix 75 mg once a  day. 9. Nitroglycerin 0.4 mg once a day.  FAMILY HISTORY:  Her brother passed away from hypertensive heart disease.  She reports that there is no history of coronary artery disease in the family.  Her mom had a history of breast cancer and father also had a history of hypertension.  SOCIAL HISTORY:  There is history of tobacco abuse.  She reports to have quit 3 to 4 months ago.  There is history of cocaine abuse which is ongoing.  She denies the use of alcohol.  She currently lives in [place] with her daughter.  PHYSICAL EXAMINATION:  VITALS: T-max 97.6, pulse rate 69, respiratory rate 22, blood pressure 126/86.  O2 sats 95% on room air. GENERAL APPEARANCE: Not in acute distress, alert, awake and oriented x3. Afebrile. HEENT:  Normocephalic, atraumatic.  Pupils are equal and reactive to light and accommodation.  Extraocular muscles are intact.  Mucous membranes are moist. NECK: Supple.  No JVD, lymphadenopathy or carotid bruit. CVS: Regular rhythm, rate is normal.  No murmurs, rubs or gallops. LUNGS: Clear to auscultation bilaterally. ABDOMEN: Benign. EXTREMITIES: No clubbing, cyanosis or edema. NEUROLOGIC: Exam is grossly nonfocal.  LABORATORY DATA AND STUDIES: 1. Urine drug screen negative.  BNP 917. 2. CK-MB 2.9, troponin less than 0.05. 3. Chest x-ray:  Hyperinflation and COPD.  No focal findings. 4. Sodium 141, potassium 4, chloride 109, BUN 27, creatinine 1.4,     blood glucose 68, hemoglobin 15, hematocrit 44.  ASSESSMENT AND PLAN: 1. Shortness of breath, most likely this was secondary to her chronic     obstructive pulmonary disease.  She ran out of her inhalers and did     not have anything for symptomatic relief.  She is not in acute     exacerbation at this time.  She reportedly was wheezing when the     EMTs examined her but now she is clear.  We will continue the     nebulizations and will give her prescriptions at the time of     discharge for her Ventolin and Spiriva inhalers.  No need to start     her on steroids at this point. 2. Chronic combined systolic and diastolic heart failure.  Very well     compensated.  We will continue home medications. 3. Coronary artery disease.  Will continue risk factor modification.     She was strongly urged to stay away from cocaine.  She verbalized     understanding this. 4. Hypertension, at goal.  Continue home medications. 5. Deep vein thrombosis prophylaxis with unfractionated heparin.  CODE STATUS:  Full.     Lucile Crater, MD     TA/MEDQ  D:  01/08/2010  T:  01/08/2010  Job:  [id number]  Electronically Signed by Lucile Crater MD on 04/13/2010 04:11:20 PM

## 2010-04-14 NOTE — Letter (Signed)
Summary: CASE MANAGEMENT PROGRESS SUMMARY  CASE MANAGEMENT PROGRESS SUMMARY   Imported By: Arta Bruce 04/06/2010 09:52:09  _____________________________________________________________________  External Attachment:    Type:   Image     Comment:   External Document

## 2010-04-15 ENCOUNTER — Emergency Department (HOSPITAL_COMMUNITY): Payer: Medicare Other

## 2010-04-15 ENCOUNTER — Inpatient Hospital Stay (HOSPITAL_COMMUNITY)
Admission: EM | Admit: 2010-04-15 | Discharge: 2010-04-23 | DRG: 313 | Disposition: A | Discharge status: Home / Self Care | Payer: Medicare Other | Attending: Cardiology | Admitting: Cardiology

## 2010-04-15 DIAGNOSIS — F17200 Nicotine dependence, unspecified, uncomplicated: Secondary | ICD-10-CM | POA: Diagnosis present

## 2010-04-15 DIAGNOSIS — F1410 Cocaine abuse, uncomplicated: Secondary | ICD-10-CM | POA: Diagnosis present

## 2010-04-15 DIAGNOSIS — F209 Schizophrenia, unspecified: Secondary | ICD-10-CM | POA: Diagnosis present

## 2010-04-15 DIAGNOSIS — N189 Chronic kidney disease, unspecified: Secondary | ICD-10-CM | POA: Diagnosis present

## 2010-04-15 DIAGNOSIS — E785 Hyperlipidemia, unspecified: Secondary | ICD-10-CM | POA: Diagnosis present

## 2010-04-15 DIAGNOSIS — Z79899 Other long term (current) drug therapy: Secondary | ICD-10-CM

## 2010-04-15 DIAGNOSIS — Z7902 Long term (current) use of antithrombotics/antiplatelets: Secondary | ICD-10-CM

## 2010-04-15 DIAGNOSIS — I509 Heart failure, unspecified: Secondary | ICD-10-CM | POA: Diagnosis present

## 2010-04-15 DIAGNOSIS — I428 Other cardiomyopathies: Secondary | ICD-10-CM | POA: Diagnosis present

## 2010-04-15 DIAGNOSIS — R0789 Other chest pain: Principal | ICD-10-CM | POA: Diagnosis present

## 2010-04-15 DIAGNOSIS — J441 Chronic obstructive pulmonary disease with (acute) exacerbation: Secondary | ICD-10-CM | POA: Diagnosis not present

## 2010-04-15 DIAGNOSIS — I129 Hypertensive chronic kidney disease with stage 1 through stage 4 chronic kidney disease, or unspecified chronic kidney disease: Secondary | ICD-10-CM | POA: Diagnosis present

## 2010-04-15 DIAGNOSIS — I2510 Atherosclerotic heart disease of native coronary artery without angina pectoris: Secondary | ICD-10-CM | POA: Diagnosis present

## 2010-04-15 DIAGNOSIS — I5022 Chronic systolic (congestive) heart failure: Secondary | ICD-10-CM | POA: Diagnosis present

## 2010-04-15 DIAGNOSIS — I252 Old myocardial infarction: Secondary | ICD-10-CM

## 2010-04-15 DIAGNOSIS — Z853 Personal history of malignant neoplasm of breast: Secondary | ICD-10-CM

## 2010-04-15 LAB — URINALYSIS, ROUTINE W REFLEX MICROSCOPIC
Ketones, ur: NEGATIVE mg/dL
Protein, ur: NEGATIVE mg/dL
Urine Glucose, Fasting: NEGATIVE mg/dL
Urobilinogen, UA: 0.2 mg/dL (ref 0.0–1.0)

## 2010-04-15 LAB — DIFFERENTIAL
Eosinophils Relative: 4 % (ref 0–5)
Lymphocytes Relative: 33 % (ref 12–46)
Lymphs Abs: 1.3 10*3/uL (ref 0.7–4.0)
Monocytes Relative: 11 % (ref 3–12)

## 2010-04-15 LAB — POCT I-STAT, CHEM 8
BUN: 20 mg/dL (ref 6–23)
Calcium, Ion: 1.08 mmol/L — ABNORMAL LOW (ref 1.12–1.32)
Chloride: 108 mEq/L (ref 96–112)
Glucose, Bld: 82 mg/dL (ref 70–99)

## 2010-04-15 LAB — CBC
HCT: 42 % (ref 36.0–46.0)
MCH: 31.3 pg (ref 26.0–34.0)
MCV: 93.3 fL (ref 78.0–100.0)
RBC: 4.5 MIL/uL (ref 3.87–5.11)
RDW: 12.8 % (ref 11.5–15.5)
WBC: 4.1 10*3/uL (ref 4.0–10.5)

## 2010-04-15 LAB — POCT CARDIAC MARKERS: Troponin i, poc: 0.18 ng/mL — ABNORMAL HIGH (ref 0.00–0.09)

## 2010-04-15 LAB — URINE MICROSCOPIC-ADD ON

## 2010-04-15 LAB — PROTIME-INR: Prothrombin Time: 13.1 seconds (ref 11.6–15.2)

## 2010-04-16 ENCOUNTER — Inpatient Hospital Stay (HOSPITAL_COMMUNITY): Payer: Medicare Other

## 2010-04-16 LAB — URINE CULTURE

## 2010-04-16 LAB — CBC
HCT: 39.3 % (ref 36.0–46.0)
MCH: 31.1 pg (ref 26.0–34.0)
MCHC: 33.8 g/dL (ref 30.0–36.0)
MCV: 91.8 fL (ref 78.0–100.0)
RDW: 13 % (ref 11.5–15.5)

## 2010-04-16 LAB — RAPID URINE DRUG SCREEN, HOSP PERFORMED
Amphetamines: NOT DETECTED
Tetrahydrocannabinol: NOT DETECTED

## 2010-04-16 LAB — CK TOTAL AND CKMB (NOT AT ARMC)
CK, MB: 4.1 ng/mL — ABNORMAL HIGH (ref 0.3–4.0)
CK, MB: 3.8 ng/mL (ref 0.3–4.0)
Total CK: 104 U/L (ref 7–177)
Total CK: 99 U/L (ref 7–177)

## 2010-04-17 ENCOUNTER — Inpatient Hospital Stay (HOSPITAL_COMMUNITY): Payer: Medicare Other

## 2010-04-17 LAB — BASIC METABOLIC PANEL
CO2: 26 mEq/L (ref 19–32)
Calcium: 9.1 mg/dL (ref 8.4–10.5)
Glucose, Bld: 81 mg/dL (ref 70–99)
Sodium: 143 mEq/L (ref 135–145)

## 2010-04-17 LAB — CBC
HCT: 38.4 % (ref 36.0–46.0)
Hemoglobin: 12.6 g/dL (ref 12.0–15.0)
MCH: 31 pg (ref 26.0–34.0)
MCHC: 32.8 g/dL (ref 30.0–36.0)

## 2010-04-17 LAB — LIPID PANEL
Total CHOL/HDL Ratio: 1.7 RATIO
Triglycerides: 35 mg/dL (ref <150)
VLDL: 7 mg/dL (ref 0–40)

## 2010-04-17 MED ORDER — TECHNETIUM TC 99M TETROFOSMIN IV KIT
10.0000 | PACK | Freq: Once | INTRAVENOUS | Status: AC | PRN
  Administered 2010-04-17: 11:00:00 10 via INTRAVENOUS

## 2010-04-17 MED ORDER — TECHNETIUM TC 99M TETROFOSMIN IV KIT
30.0000 | PACK | Freq: Once | INTRAVENOUS | Status: AC | PRN
  Administered 2010-04-17: 11:00:00 30 via INTRAVENOUS

## 2010-04-18 ENCOUNTER — Other Ambulatory Visit (HOSPITAL_COMMUNITY): Payer: Medicaid Other

## 2010-04-18 ENCOUNTER — Inpatient Hospital Stay (HOSPITAL_COMMUNITY): Payer: Medicare Other

## 2010-04-18 DIAGNOSIS — J441 Chronic obstructive pulmonary disease with (acute) exacerbation: Secondary | ICD-10-CM

## 2010-04-18 LAB — BLOOD GAS, ARTERIAL
Bicarbonate: 26.2 mEq/L — ABNORMAL HIGH (ref 20.0–24.0)
O2 Content: 2 L/min
TCO2: 27.6 mmol/L (ref 0–100)
pH, Arterial: 7.379 (ref 7.350–7.400)
pO2, Arterial: 109 mmHg — ABNORMAL HIGH (ref 80.0–100.0)

## 2010-04-19 DIAGNOSIS — R062 Wheezing: Secondary | ICD-10-CM

## 2010-04-19 DIAGNOSIS — J441 Chronic obstructive pulmonary disease with (acute) exacerbation: Secondary | ICD-10-CM

## 2010-04-20 LAB — CBC
HCT: 42.7 % (ref 36.0–46.0)
Hemoglobin: 14 g/dL (ref 12.0–15.0)
RBC: 4.6 MIL/uL (ref 3.87–5.11)

## 2010-04-20 LAB — BASIC METABOLIC PANEL
CO2: 26 mEq/L (ref 19–32)
Calcium: 9.5 mg/dL (ref 8.4–10.5)
Chloride: 103 mEq/L (ref 96–112)
GFR calc Af Amer: 45 mL/min — ABNORMAL LOW (ref >60)
Potassium: 3.9 mEq/L (ref 3.5–5.1)
Sodium: 140 mEq/L (ref 135–145)

## 2010-04-21 ENCOUNTER — Inpatient Hospital Stay (HOSPITAL_COMMUNITY): Payer: Medicare Other

## 2010-04-21 LAB — PULMONARY FUNCTION TEST

## 2010-04-22 ENCOUNTER — Encounter (INDEPENDENT_AMBULATORY_CARE_PROVIDER_SITE_OTHER): Payer: Self-pay | Admitting: Nurse Practitioner

## 2010-04-22 DIAGNOSIS — J441 Chronic obstructive pulmonary disease with (acute) exacerbation: Secondary | ICD-10-CM

## 2010-04-22 DIAGNOSIS — R062 Wheezing: Secondary | ICD-10-CM

## 2010-04-22 LAB — CONVERTED CEMR LAB
Chloride: 103 meq/L
Hemoglobin: 13.1 g/dL
MCHC: 32.8 g/dL
MCV: 95.2 fL
Potassium: 3.6 meq/L
RBC: 4.19 M/uL
Sodium: 143 meq/L

## 2010-04-22 LAB — CBC
HCT: 39.9 % (ref 36.0–46.0)
Hemoglobin: 13.1 g/dL (ref 12.0–15.0)
MCV: 95.2 fL (ref 78.0–100.0)
Platelets: 204 10*3/uL (ref 150–400)
RBC: 4.19 MIL/uL (ref 3.87–5.11)
WBC: 7.4 10*3/uL (ref 4.0–10.5)

## 2010-04-22 LAB — BASIC METABOLIC PANEL
BUN: 38 mg/dL — ABNORMAL HIGH (ref 6–23)
CO2: 28 mEq/L (ref 19–32)
Chloride: 103 mEq/L (ref 96–112)
Potassium: 3.6 mEq/L (ref 3.5–5.1)

## 2010-04-29 ENCOUNTER — Encounter: Payer: Self-pay | Admitting: Nurse Practitioner

## 2010-04-29 ENCOUNTER — Encounter (INDEPENDENT_AMBULATORY_CARE_PROVIDER_SITE_OTHER): Payer: Self-pay | Admitting: Nurse Practitioner

## 2010-05-03 NOTE — Consult Note (Signed)
NAME:  [name], [name]              ACCOUNT NO.:  [id number]  MEDICAL RECORD NO.:  [id number]           PATIENT TYPE:  I  LOCATION:  3742                         FACILITY:  [facility]  PHYSICIAN:  Cidney Hulett          DATE OF BIRTH:  April 24, 1945  DATE OF CONSULTATION: DATE OF DISCHARGE:                                CONSULTATION   REASON FOR CONSULTATION:  I was asked to see the patient by Dr. Shana Chute for the evaluation of shortness of breath.  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  She presented this hospitalization on April 15, 2010, with chest pain, was found to have a small bump in her troponin. She was admitted and on April 17, 2010, had a Persantine stress test with myocardial perfusion imaging that showed no reversible defects being measured her ejection fraction of 17%.  Per her nurse, she appeared that she had increased work of breathing tonight and was noted to have significant wheezing bilaterally on her examination.  When I spoke to the patient, said that she had been short for breath for a period of years and this had not changed recently.  She said she is markedly symptomatic with any exertion.  She denied chest pain, palpitations, and presyncopal/syncopal symptoms.  She is very difficult to obtain a history from that I could not elicit any other symptoms from her besides the presence or absence of aforementioned ones.  REVIEW OF SYSTEMS:  10/14 systems were unremarkable other than stated, otherwise.  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  PAST SURGICAL  HISTORY:  Hysterectomy, pacemaker implantation status post removal.  SOCIAL HISTORY:  Former smoker, occasional alcohol use, continued cocaine abuse.  Lives with her daughter.  FAMILY HISTORY:  Father passed away of "natural causes" and had a history of hypertension.  Mother passed away from breast cancer. Brother passed away from heart disease.  ALLERGIES:  CODEINE, pruritus; PENICILLIN, rash; HYDRALAZINE, headache.  MEDICATIONS:  Albuterol, Atrovent, aspirin, Coreg, Plavix, diltiazem, Lovenox, Lasix, isosorbide nitrate, lisinopril, nitroglycerin, Zyprexa, Protonix, Seroquel, Zocor, Spiriva, trazodone, and Ambien.  PHYSICAL EXAMINATION:  VITAL SIGNS:  Temperature 97.5 degrees Fahrenheit, heart rate in the 70s, respirations in the low-to-mid 10s, SPO2 100% on 2 liters nasal cannula, blood pressure 134/90. GENERAL:  No apparent distress, thin habitus. ENT:  Moist mucous membranes. NECK:  No masses, JVD 12-15 cm. LYMPH:  No cervical or supraclavicular lymphadenopathy. CARDIOVASCULAR:  Regular rate and rhythm with no murmur. PULMONARY:  Expiratory wheezing, diffusely and bilaterally.  No accessory muscle use. ABDOMEN:  Soft and nontender. EXTREMITIES:  No lower extremity clubbing. SKIN:  No rashes.  LABS:  ABG; 7.38, PCO2 46, PO2 109, on 2 liters nasal cannula.  IMAGING:  An AP chest x-ray is largely unremarkable and  seems unchanged from the AP film on April 15, 2010.  IMPRESSION AND PLAN:  This is a 65 year old African American female who has a history of dilated cardiomyopathy and continued cocaine use, who was admitted with chest pain and had a negative noninvasive evaluation for ischemia although she does not list a cellulitis history.  She was observed to have increased work of breathing tonight and has prominent bilateral wheezing on my exam.  She was given 40 mg of p.o. and IV Lasix and has had resulted in urine output about 700 mL, and she was also given multiple  nebulized bronchodilator treatments.  This all seems most consistent with chronic obstructive pulmonary disease exacerbation to me, so I would suggest to continue her baseline regimen with the addition of 125 mg Solu-Medrol now, then 40 grams of prednisone daily for 5 days plus a fluoroquinolone antibiotic for 5 days.   Dictation ended at this point.     Cidney Hulett     CH/MEDQ  D:  04/18/2010  T:  04/19/2010  Job:  [id number]  Electronically Signed by Billy Fischer MD on 05/03/2010 11:59:20 AM

## 2010-05-04 LAB — COMPREHENSIVE METABOLIC PANEL
ALT: 11 U/L (ref 0–35)
ALT: 11 U/L (ref 0–35)
Albumin: 3.5 g/dL (ref 3.5–5.2)
Alkaline Phosphatase: 52 U/L (ref 39–117)
BUN: 24 mg/dL — ABNORMAL HIGH (ref 6–23)
BUN: 30 mg/dL — ABNORMAL HIGH (ref 6–23)
CO2: 26 mEq/L (ref 19–32)
Calcium: 9.2 mg/dL (ref 8.4–10.5)
Chloride: 109 mEq/L (ref 96–112)
Creatinine, Ser: 1.54 mg/dL — ABNORMAL HIGH (ref 0.4–1.2)
GFR calc non Af Amer: 34 mL/min — ABNORMAL LOW (ref >60)
Glucose, Bld: 192 mg/dL — ABNORMAL HIGH (ref 70–99)
Glucose, Bld: 97 mg/dL (ref 70–99)
Potassium: 4.3 mEq/L (ref 3.5–5.1)
Sodium: 142 mEq/L (ref 135–145)
Total Bilirubin: 0.5 mg/dL (ref 0.3–1.2)

## 2010-05-04 LAB — DIFFERENTIAL
Basophils Absolute: 0 10*3/uL (ref 0.0–0.1)
Eosinophils Absolute: 0.1 10*3/uL (ref 0.0–0.7)
Eosinophils Absolute: 0.2 10*3/uL (ref 0.0–0.7)
Eosinophils Relative: 6 % — ABNORMAL HIGH (ref 0–5)
Eosinophils Relative: 2 % (ref 0–5)
Lymphocytes Relative: 30 % (ref 12–46)
Lymphocytes Relative: 15 % (ref 12–46)
Lymphs Abs: 1 10*3/uL (ref 0.7–4.0)
Lymphs Abs: 0.6 10*3/uL — ABNORMAL LOW (ref 0.7–4.0)
Lymphs Abs: 1 10*3/uL (ref 0.7–4.0)
Monocytes Absolute: 0.4 10*3/uL (ref 0.1–1.0)
Monocytes Absolute: 0.6 10*3/uL (ref 0.1–1.0)
Neutro Abs: 3.2 10*3/uL (ref 1.7–7.7)
Neutrophils Relative %: 80 % — ABNORMAL HIGH (ref 43–77)

## 2010-05-04 LAB — URINALYSIS, ROUTINE W REFLEX MICROSCOPIC
Leukocytes, UA: NEGATIVE
Nitrite: NEGATIVE
Specific Gravity, Urine: 1.011 (ref 1.005–1.030)
pH: 6 (ref 5.0–8.0)

## 2010-05-04 LAB — POCT CARDIAC MARKERS
CKMB, poc: 1.5 ng/mL (ref 1.0–8.0)
CKMB, poc: 2.1 ng/mL (ref 1.0–8.0)
CKMB, poc: 2.9 ng/mL (ref 1.0–8.0)
Myoglobin, poc: 87.5 ng/mL (ref 12–200)
Myoglobin, poc: 155 ng/mL (ref 12–200)
Troponin i, poc: 0.07 ng/mL (ref 0.00–0.09)
Troponin i, poc: <0.05 ng/mL (ref 0.00–0.09)

## 2010-05-04 LAB — POCT I-STAT, CHEM 8
Calcium, Ion: 1.23 mmol/L (ref 1.12–1.32)
Chloride: 109 mEq/L (ref 96–112)
Creatinine, Ser: 1.4 mg/dL — ABNORMAL HIGH (ref 0.4–1.2)
Glucose, Bld: 68 mg/dL — ABNORMAL LOW (ref 70–99)
Potassium: 4 mEq/L (ref 3.5–5.1)

## 2010-05-04 LAB — URINE MICROSCOPIC-ADD ON

## 2010-05-04 LAB — CARDIAC PANEL(CRET KIN+CKTOT+MB+TROPI)
Relative Index: 4 — ABNORMAL HIGH (ref 0.0–2.5)
Relative Index: 4.5 — ABNORMAL HIGH (ref 0.0–2.5)
Relative Index: 7.8 — ABNORMAL HIGH (ref 0.0–2.5)
Troponin I: 0.16 ng/mL — ABNORMAL HIGH (ref 0.00–0.06)

## 2010-05-04 LAB — CBC
HCT: 40 % (ref 36.0–46.0)
HCT: 35.2 % — ABNORMAL LOW (ref 36.0–46.0)
HCT: 41.9 % (ref 36.0–46.0)
MCH: 31.3 pg (ref 26.0–34.0)
MCH: 32.3 pg (ref 26.0–34.0)
MCHC: 32.8 g/dL (ref 30.0–36.0)
MCV: 96.6 fL (ref 78.0–100.0)
MCV: 94.4 fL (ref 78.0–100.0)
MCV: 95.2 fL (ref 78.0–100.0)
Platelets: 202 10*3/uL (ref 150–400)
Platelets: 229 10*3/uL (ref 150–400)
RBC: 3.73 MIL/uL — ABNORMAL LOW (ref 3.87–5.11)
RBC: 4.4 MIL/uL (ref 3.87–5.11)
RDW: 13.5 % (ref 11.5–15.5)
RDW: 12.8 % (ref 11.5–15.5)
RDW: 14.1 % (ref 11.5–15.5)
WBC: 3.2 10*3/uL — ABNORMAL LOW (ref 4.0–10.5)
WBC: 7.1 10*3/uL (ref 4.0–10.5)

## 2010-05-04 LAB — BASIC METABOLIC PANEL
BUN: 29 mg/dL — ABNORMAL HIGH (ref 6–23)
CO2: 28 mEq/L (ref 19–32)
Calcium: 9.3 mg/dL (ref 8.4–10.5)
Chloride: 105 mEq/L (ref 96–112)
Creatinine, Ser: 1.79 mg/dL — ABNORMAL HIGH (ref 0.4–1.2)
GFR calc Af Amer: 49 mL/min — ABNORMAL LOW (ref >60)
GFR calc non Af Amer: 40 mL/min — ABNORMAL LOW (ref >60)
Sodium: 141 mEq/L (ref 135–145)

## 2010-05-04 LAB — PHOSPHORUS: Phosphorus: 3.7 mg/dL (ref 2.3–4.6)

## 2010-05-04 LAB — RAPID URINE DRUG SCREEN, HOSP PERFORMED
Barbiturates: NOT DETECTED
Barbiturates: NOT DETECTED
Benzodiazepines: NOT DETECTED
Benzodiazepines: NOT DETECTED
Cocaine: POSITIVE — AB
Opiates: NOT DETECTED

## 2010-05-04 LAB — APTT: aPTT: 35 seconds (ref 24–37)

## 2010-05-04 LAB — LIPID PANEL
Cholesterol: 183 mg/dL (ref 0–200)
HDL: 91 mg/dL (ref >39)
LDL Cholesterol: 81 mg/dL (ref 0–99)
Triglycerides: 110 mg/dL (ref <150)
Triglycerides: 56 mg/dL (ref <150)
VLDL: 22 mg/dL (ref 0–40)

## 2010-05-04 LAB — URINE CULTURE

## 2010-05-04 LAB — CK TOTAL AND CKMB (NOT AT ARMC): Total CK: 117 U/L (ref 7–177)

## 2010-05-04 LAB — MAGNESIUM: Magnesium: 1.7 mg/dL (ref 1.5–2.5)

## 2010-05-04 NOTE — Assessment & Plan Note (Signed)
Summary: Hospital F/u   Vital Signs:  Patient profile:   65 year old female Menstrual status:  hysterectomy Weight:      100.4 pounds BMI:     19.04 O2 Sat:      94 % on Room air Temp:     97.4 degrees F oral Pulse rate:   70 / minute Pulse rhythm:   regular Resp:     20 per minute BP sitting:   135 / 91  (left arm) Cuff size:   regular  Vitals Entered By: Levon Hedger (April 29, 2010 2:31 PM)  O2 Flow:  Room air CC: trouble breathing, Hypertension Management, CHF Management Is Patient Diabetic? No Pain Assessment Patient in pain? no       Does patient need assistance? Functional Status Self care Ambulation Normal   CC:  trouble breathing, Hypertension Management, and CHF Management.  History of Present Illness:  Pt into the office for f/u on hospitalization. Daughter present with pt today She presents with MULTIPLE (over 40 bottle) of medications from prior and Rx filled from most recent hospitalization - RN into the room to sort pt's medication.  reviewed discharge meds with pts meds. combined bottles.  reviewed which meds pt should be taking now and which she should stop.  Advised daughter to put pt's medications in a pill box at home  Pt was admitted on 04/15/2010 to 04/23/2010 with atypical chest pain, r/o myocardial infaraction, severe nonischemic cardiomyopathy, history of non Q-wave mycardial infarction in the past secondary to vasopsasm secondary to cocaine abuse, htn, COPD, hx of polysubstance abuse and tobacco abuse, mild renal insuffiency, hx of schizophrenia, hypercholesterolmia and history of breast cancer  Full Hospital D/C reviewed  CHF History:      Daily weights are not being checked.  She does not understand fluid management and sodium restriction and is not following the regimen.  The patient expresses lack of understanding of the treatment plan and her medications.  ADL's are being done without symptoms or restrictions.  The patient has not been  enrolled in the CHF Eduction Program.  She denies any side effects from her medications.  Other comments include: pt was advised to monitor weight and restrict fluid to 1 liters per day. Pt has a f/u appt at [facility] on 05/10/2010 at 3:30.    Hypertension History:      She denies headache, chest pain, and palpitations.  She notes no problems with any antihypertensive medication side effects.  Further comments include: meds reviewed and sorted during this visit.        Positive major cardiovascular risk factors include female age 6 years old or older, hyperlipidemia, and hypertension.  Negative major cardiovascular risk factors include no history of diabetes and non-tobacco-user status.        Positive history for target organ damage include ASHD (either angina/prior MI/prior CABG), cardiac end organ damage (either CHF or LVH), prior stroke (or TIA), peripheral vascular disease, and renal insufficiency.      Habits & Providers  Alcohol-Tobacco-Diet     Alcohol drinks/day: 1     Alcohol Counseling: to STOP drinking     Alcohol type: spirits     Tobacco Status: quit     Tobacco Counseling: to remain off tobacco products     Year Started: age 69     Year Quit: 2010  Exercise-Depression-Behavior     Does Patient Exercise: no     Drug Use: cocaine  Comments: recent UDS negative for  cocaine  Allergies (verified): 1)  ! Penicillin 2)  ! Codeine  Past History:  Past Medical History: breast cancer  Review of Systems General:  Denies fever. CV:  Denies fatigue. Resp:  Denies cough. GI:  Denies abdominal pain, nausea, and vomiting.  Physical Exam  General:  alert.   Head:  normocephalic.   Lungs:  normal breath sounds.   Heart:  normal rate and regular rhythm.   Msk:  normal ROM.   Neurologic:  alert & oriented X3.   Skin:  color normal.   Psych:  Oriented X3.     Impression & Recommendations:  Problem # 1:  CHF (ICD-428.0) reviewed recent recommendations of daily  weights The following medications were removed from the medication list:    Spironolactone 25 Mg Tabs (Spironolactone) ..... Hold Her updated medication list for this problem includes:    Carvedilol 3.125 Mg Tabs (Carvedilol) ..... One tablet by mouth two times a day with breakfast and dinner for heart    Aspir-low 81 Mg Tbec (Aspirin) ..... One tablet by mouth daily after breakfast    Lisinopril 10 Mg Tabs (Lisinopril) ..... One tablet by mouth daily for blood pressure    Plavix 75 Mg Tabs (Clopidogrel bisulfate) ..... One tablet by mouth daily for circulation    Furosemide 40 Mg Tabs (Furosemide) ..... One tablet by mouth daily for fluid  Problem # 2:  RENAL FAILURE, ACUTE (ICD-584.9) BUN 38 and crt 1.43 - will continue to monitor  Problem # 3:  ASTHMA (ICD-493.90)  Her updated medication list for this problem includes:    Proventil Hfa 108 (90 Base) Mcg/act Aers (Albuterol sulfate) .Marland Kitchen..Marland Kitchen Two sprays ever 6 hours as needed for shortness of breath    Spiriva Handihaler 18 Mcg Caps (Tiotropium bromide monohydrate) ..... One capsule by inhalation daily    Albuterol Sulfate (2.5 Mg/33ml) 0.083% Nebu (Albuterol sulfate) ..... Use every 6 hours as needed for breathing    Symbicort 80-4.5 Mcg/act Aero (Budesonide-formoterol fumarate) .Marland Kitchen... 2 puffs twice daily  Problem # 4:  HYPERLIPIDEMIA (ICD-272.4)  Her updated medication list for this problem includes:    Simvastatin 10 Mg Tabs (Simvastatin) ..... One tablet by mouth nightly for cholesterol  Problem # 5:  HYPERTENSION, BENIGN ESSENTIAL (ICD-401.1)  The following medications were removed from the medication list:    Spironolactone 25 Mg Tabs (Spironolactone) ..... Hold Her updated medication list for this problem includes:    Carvedilol 3.125 Mg Tabs (Carvedilol) ..... One tablet by mouth two times a day with breakfast and dinner for heart    Lisinopril 10 Mg Tabs (Lisinopril) ..... One tablet by mouth daily for blood pressure     Furosemide 40 Mg Tabs (Furosemide) ..... One tablet by mouth daily for fluid  Problem # 6:  COCAINE ABUSE (ICD-305.60) UDS negative during recent office visit large part of visit today was spent sorting meds  Complete Medication List: 1)  Colace 100 Mg Caps (Docusate sodium) .... One tablet by mouth two times a day for stools 2)  Carvedilol 3.125 Mg Tabs (Carvedilol) .... One tablet by mouth two times a day with breakfast and dinner for heart 3)  Aspir-low 81 Mg Tbec (Aspirin) .... One tablet by mouth daily after breakfast 4)  Isosorbide Mononitrate Cr 30 Mg Xr24h-tab (Isosorbide mononitrate) .... One tablet by mouth daily at noon 5)  Simvastatin 10 Mg Tabs (Simvastatin) .... One tablet by mouth nightly for cholesterol 6)  Lisinopril 10 Mg Tabs (Lisinopril) .... One tablet by mouth  daily for blood pressure 7)  Plavix 75 Mg Tabs (Clopidogrel bisulfate) .... One tablet by mouth daily for circulation 8)  Proventil Hfa 108 (90 Base) Mcg/act Aers (Albuterol sulfate) .... Two sprays ever 6 hours as needed for shortness of breath 9)  Spiriva Handihaler 18 Mcg Caps (Tiotropium bromide monohydrate) .... One capsule by inhalation daily 10)  Nitro-dur 0.4 Mg/hr Pt24 (Nitroglycerin) .... Take on tablet at onset of chest pain, may repeat every 5 minutes up to 3 tablets 11)  Zyprexa 2.5 Mg Tabs (Olanzapine) .... One tablet by mouth daily  ** rx by mental health** 12)  Trazodone Hcl 50 Mg Tabs (Trazodone hcl) .... One tablet by mouth nightly as needed for sleep 13)  Furosemide 40 Mg Tabs (Furosemide) .... One tablet by mouth daily for fluid 14)  Omeprazole 20 Mg Tbec (Omeprazole) .... One tablet by mouth daily in the morning before breakfast 15)  Albuterol Sulfate (2.5 Mg/13ml) 0.083% Nebu (Albuterol sulfate) .... Use every 6 hours as needed for breathing 16)  Symbicort 80-4.5 Mcg/act Aero (Budesonide-formoterol fumarate) .... 2 puffs twice daily 17)  Seroquel 50 Mg Tabs (Quetiapine fumarate) .... One tablet  by mouth nightly  CHF Assessment/Plan:      [name] current weight is 100.4 pounds.  Her previous weight was 93.25 pounds.    Hypertension Assessment/Plan:      [name] hypertensive risk group is category C: Target organ damage and/or diabetes.  Today's blood pressure is 135/91.  Her blood pressure goal is < 140/90.  Patient Instructions: 1)  Please take your medications as ordered 2)  Follow up in 6 weeks for medication review 3)  will need BMP Prescriptions: ALBUTEROL SULFATE (2.5 MG/3ML) 0.083% NEBU (ALBUTEROL SULFATE) Use every 6 hours as needed for breathing  #100 x 1   Entered and Authorized by:   Lehman Prom FNP   Signed by:   Lehman Prom FNP on 04/29/2010   Method used:   Print then Give to Patient   RxID:   [id number]    Orders Added: 1)  Est. Patient Level IV [14782] 2)  Consultation Level II [95621]

## 2010-05-05 ENCOUNTER — Emergency Department (HOSPITAL_COMMUNITY)
Admission: EM | Admit: 2010-05-05 | Discharge: 2010-05-06 | Disposition: A | Discharge status: Home / Self Care | Payer: Medicare Other | Attending: Emergency Medicine | Admitting: Emergency Medicine

## 2010-05-05 DIAGNOSIS — R5383 Other fatigue: Secondary | ICD-10-CM | POA: Insufficient documentation

## 2010-05-05 DIAGNOSIS — R5381 Other malaise: Secondary | ICD-10-CM | POA: Insufficient documentation

## 2010-05-05 DIAGNOSIS — J4489 Other specified chronic obstructive pulmonary disease: Secondary | ICD-10-CM | POA: Insufficient documentation

## 2010-05-05 DIAGNOSIS — I2510 Atherosclerotic heart disease of native coronary artery without angina pectoris: Secondary | ICD-10-CM | POA: Insufficient documentation

## 2010-05-05 DIAGNOSIS — R42 Dizziness and giddiness: Secondary | ICD-10-CM | POA: Insufficient documentation

## 2010-05-05 DIAGNOSIS — R040 Epistaxis: Secondary | ICD-10-CM | POA: Insufficient documentation

## 2010-05-05 DIAGNOSIS — E785 Hyperlipidemia, unspecified: Secondary | ICD-10-CM | POA: Insufficient documentation

## 2010-05-05 DIAGNOSIS — R0602 Shortness of breath: Secondary | ICD-10-CM | POA: Insufficient documentation

## 2010-05-05 DIAGNOSIS — Z8673 Personal history of transient ischemic attack (TIA), and cerebral infarction without residual deficits: Secondary | ICD-10-CM | POA: Insufficient documentation

## 2010-05-05 DIAGNOSIS — J449 Chronic obstructive pulmonary disease, unspecified: Secondary | ICD-10-CM | POA: Insufficient documentation

## 2010-05-05 DIAGNOSIS — I252 Old myocardial infarction: Secondary | ICD-10-CM | POA: Insufficient documentation

## 2010-05-05 DIAGNOSIS — I509 Heart failure, unspecified: Secondary | ICD-10-CM | POA: Insufficient documentation

## 2010-05-05 DIAGNOSIS — Z79899 Other long term (current) drug therapy: Secondary | ICD-10-CM | POA: Insufficient documentation

## 2010-05-06 ENCOUNTER — Emergency Department (HOSPITAL_COMMUNITY): Payer: Medicare Other

## 2010-05-06 LAB — URINALYSIS, ROUTINE W REFLEX MICROSCOPIC
Bilirubin Urine: NEGATIVE
Bilirubin Urine: NEGATIVE
Ketones, ur: NEGATIVE mg/dL
Ketones, ur: NEGATIVE mg/dL
Nitrite: NEGATIVE
Nitrite: NEGATIVE
Protein, ur: NEGATIVE mg/dL
Protein, ur: NEGATIVE mg/dL
pH: 6.5 (ref 5.0–8.0)

## 2010-05-06 LAB — BASIC METABOLIC PANEL
BUN: 30 mg/dL — ABNORMAL HIGH (ref 6–23)
BUN: 62 mg/dL — ABNORMAL HIGH (ref 6–23)
BUN: 25 mg/dL — ABNORMAL HIGH (ref 6–23)
CO2: 30 mEq/L (ref 19–32)
CO2: 24 mEq/L (ref 19–32)
Calcium: 9.2 mg/dL (ref 8.4–10.5)
Calcium: 9.3 mg/dL (ref 8.4–10.5)
Calcium: 9.6 mg/dL (ref 8.4–10.5)
Chloride: 105 mEq/L (ref 96–112)
Chloride: 97 mEq/L (ref 96–112)
Chloride: 104 mEq/L (ref 96–112)
Creatinine, Ser: 1.04 mg/dL (ref 0.4–1.2)
Creatinine, Ser: 1.3 mg/dL — ABNORMAL HIGH (ref 0.4–1.2)
Creatinine, Ser: 1.35 mg/dL — ABNORMAL HIGH (ref 0.4–1.2)
Creatinine, Ser: 1.4 mg/dL — ABNORMAL HIGH (ref 0.4–1.2)
Creatinine, Ser: 1.43 mg/dL — ABNORMAL HIGH (ref 0.4–1.2)
Creatinine, Ser: 1.35 mg/dL — ABNORMAL HIGH (ref 0.4–1.2)
GFR calc Af Amer: 45 mL/min — ABNORMAL LOW (ref >60)
GFR calc Af Amer: 46 mL/min — ABNORMAL LOW (ref >60)
GFR calc Af Amer: 48 mL/min — ABNORMAL LOW (ref >60)
GFR calc Af Amer: 50 mL/min — ABNORMAL LOW (ref >60)
GFR calc non Af Amer: 37 mL/min — ABNORMAL LOW (ref >60)
GFR calc non Af Amer: 39 mL/min — ABNORMAL LOW (ref >60)
GFR calc non Af Amer: 41 mL/min — ABNORMAL LOW (ref >60)
Glucose, Bld: 130 mg/dL — ABNORMAL HIGH (ref 70–99)
Glucose, Bld: 93 mg/dL (ref 70–99)
Sodium: 135 mEq/L (ref 135–145)
Sodium: 139 mEq/L (ref 135–145)
Sodium: 139 mEq/L (ref 135–145)

## 2010-05-06 LAB — DIFFERENTIAL
Basophils Absolute: 0 10*3/uL (ref 0.0–0.1)
Basophils Absolute: 0 10*3/uL (ref 0.0–0.1)
Basophils Absolute: 0 10*3/uL (ref 0.0–0.1)
Basophils Relative: 0 % (ref 0–1)
Basophils Relative: 0 % (ref 0–1)
Basophils Relative: 1 % (ref 0–1)
Eosinophils Absolute: 0 10*3/uL (ref 0.0–0.7)
Eosinophils Absolute: 0 10*3/uL (ref 0.0–0.7)
Eosinophils Absolute: 0.2 10*3/uL (ref 0.0–0.7)
Eosinophils Absolute: 0.2 10*3/uL (ref 0.0–0.7)
Eosinophils Relative: 0 % (ref 0–5)
Eosinophils Relative: 4 % (ref 0–5)
Eosinophils Relative: 6 % — ABNORMAL HIGH (ref 0–5)
Lymphocytes Relative: 11 % — ABNORMAL LOW (ref 12–46)
Lymphocytes Relative: 4 % — ABNORMAL LOW (ref 12–46)
Lymphocytes Relative: 9 % — ABNORMAL LOW (ref 12–46)
Lymphocytes Relative: 21 % (ref 12–46)
Lymphs Abs: 0.6 10*3/uL — ABNORMAL LOW (ref 0.7–4.0)
Lymphs Abs: 1.2 10*3/uL (ref 0.7–4.0)
Monocytes Absolute: 0.6 10*3/uL (ref 0.1–1.0)
Monocytes Relative: 8 % (ref 3–12)
Neutro Abs: 14 10*3/uL — ABNORMAL HIGH (ref 1.7–7.7)
Neutro Abs: 14.1 10*3/uL — ABNORMAL HIGH (ref 1.7–7.7)
Neutro Abs: 14.2 10*3/uL — ABNORMAL HIGH (ref 1.7–7.7)
Neutro Abs: 3.9 10*3/uL (ref 1.7–7.7)
Neutrophils Relative %: 86 % — ABNORMAL HIGH (ref 43–77)
Neutrophils Relative %: 90 % — ABNORMAL HIGH (ref 43–77)
Neutrophils Relative %: 91 % — ABNORMAL HIGH (ref 43–77)
Neutrophils Relative %: 68 % (ref 43–77)

## 2010-05-06 LAB — BRAIN NATRIURETIC PEPTIDE
Pro B Natriuretic peptide (BNP): 1080 pg/mL — ABNORMAL HIGH (ref 0.0–100.0)
Pro B Natriuretic peptide (BNP): 303 pg/mL — ABNORMAL HIGH (ref 0.0–100.0)
Pro B Natriuretic peptide (BNP): 440 pg/mL — ABNORMAL HIGH (ref 0.0–100.0)
Pro B Natriuretic peptide (BNP): 483 pg/mL — ABNORMAL HIGH (ref 0.0–100.0)
Pro B Natriuretic peptide (BNP): 968 pg/mL — ABNORMAL HIGH (ref 0.0–100.0)
Pro B Natriuretic peptide (BNP): 106 pg/mL — ABNORMAL HIGH (ref 0.0–100.0)
Pro B Natriuretic peptide (BNP): 301 pg/mL — ABNORMAL HIGH (ref 0.0–100.0)

## 2010-05-06 LAB — CBC
HCT: 38.8 % (ref 36.0–46.0)
Hemoglobin: 13.3 g/dL (ref 12.0–15.0)
Hemoglobin: 13.9 g/dL (ref 12.0–15.0)
Hemoglobin: 13.7 g/dL (ref 12.0–15.0)
MCH: 30.9 pg (ref 26.0–34.0)
MCH: 31.1 pg (ref 26.0–34.0)
MCH: 31.4 pg (ref 26.0–34.0)
MCH: 32 pg (ref 26.0–34.0)
MCH: 31.1 pg (ref 26.0–34.0)
MCH: 32.6 pg (ref 26.0–34.0)
MCHC: 31.4 g/dL (ref 30.0–36.0)
MCHC: 31.7 g/dL (ref 30.0–36.0)
MCHC: 32 g/dL (ref 30.0–36.0)
MCV: 97.8 fL (ref 78.0–100.0)
MCV: 99 fL (ref 78.0–100.0)
MCV: 95.4 fL (ref 78.0–100.0)
Platelets: 124 10*3/uL — ABNORMAL LOW (ref 150–400)
Platelets: 181 10*3/uL (ref 150–400)
Platelets: 194 10*3/uL (ref 150–400)
Platelets: 197 10*3/uL (ref 150–400)
Platelets: 220 10*3/uL (ref 150–400)
Platelets: 214 10*3/uL (ref 150–400)
RBC: 4.06 MIL/uL (ref 3.87–5.11)
RBC: 4.3 MIL/uL (ref 3.87–5.11)
RBC: 4.42 MIL/uL (ref 3.87–5.11)
RBC: 4.45 MIL/uL (ref 3.87–5.11)
RBC: 4.4 MIL/uL (ref 3.87–5.11)
RDW: 13.9 % (ref 11.5–15.5)
RDW: 13.9 % (ref 11.5–15.5)
RDW: 13.4 % (ref 11.5–15.5)
WBC: 15.7 10*3/uL — ABNORMAL HIGH (ref 4.0–10.5)
WBC: 16.3 10*3/uL — ABNORMAL HIGH (ref 4.0–10.5)
WBC: 4.9 10*3/uL (ref 4.0–10.5)

## 2010-05-06 LAB — URINE CULTURE
Colony Count: NO GROWTH
Culture  Setup Time: 201110031857
Culture: NO GROWTH

## 2010-05-06 LAB — POCT CARDIAC MARKERS
CKMB, poc: 3.4 ng/mL (ref 1.0–8.0)
CKMB, poc: 2.4 ng/mL (ref 1.0–8.0)
Myoglobin, poc: 109 ng/mL (ref 12–200)
Myoglobin, poc: 70.6 ng/mL (ref 12–200)
Myoglobin, poc: 71.2 ng/mL (ref 12–200)
Troponin i, poc: 0.06 ng/mL (ref 0.00–0.09)

## 2010-05-06 LAB — POCT I-STAT, CHEM 8
BUN: 32 mg/dL — ABNORMAL HIGH (ref 6–23)
Creatinine, Ser: 1.8 mg/dL — ABNORMAL HIGH (ref 0.4–1.2)
Potassium: 4 mEq/L (ref 3.5–5.1)
Sodium: 138 mEq/L (ref 135–145)

## 2010-05-06 LAB — CARDIAC PANEL(CRET KIN+CKTOT+MB+TROPI)
CK, MB: 16 ng/mL (ref 0.3–4.0)
CK, MB: 9.2 ng/mL (ref 0.3–4.0)
Relative Index: 7.3 — ABNORMAL HIGH (ref 0.0–2.5)
Relative Index: INVALID (ref 0.0–2.5)
Total CK: 219 U/L — ABNORMAL HIGH (ref 7–177)
Total CK: 89 U/L (ref 7–177)

## 2010-05-06 LAB — COMPREHENSIVE METABOLIC PANEL
ALT: 25 U/L (ref 0–35)
AST: 40 U/L — ABNORMAL HIGH (ref 0–37)
Albumin: 4.1 g/dL (ref 3.5–5.2)
Calcium: 9.5 mg/dL (ref 8.4–10.5)
Chloride: 100 mEq/L (ref 96–112)
Creatinine, Ser: 1.25 mg/dL — ABNORMAL HIGH (ref 0.4–1.2)
GFR calc Af Amer: 52 mL/min — ABNORMAL LOW (ref >60)
Sodium: 140 mEq/L (ref 135–145)

## 2010-05-06 LAB — RAPID URINE DRUG SCREEN, HOSP PERFORMED
Amphetamines: NOT DETECTED
Benzodiazepines: NOT DETECTED
Cocaine: POSITIVE — AB
Opiates: NOT DETECTED
Tetrahydrocannabinol: NOT DETECTED

## 2010-05-06 LAB — URINE MICROSCOPIC-ADD ON

## 2010-05-06 LAB — LIPID PANEL
Cholesterol: 236 mg/dL — ABNORMAL HIGH (ref 0–200)
HDL: 136 mg/dL (ref >39)
LDL Cholesterol: 81 mg/dL (ref 0–99)

## 2010-05-06 LAB — CK TOTAL AND CKMB (NOT AT ARMC)
Relative Index: 7.1 — ABNORMAL HIGH (ref 0.0–2.5)
Total CK: 106 U/L (ref 7–177)

## 2010-05-06 LAB — GLUCOSE, CAPILLARY

## 2010-05-07 LAB — CBC
MCV: 94.8 fL (ref 78.0–100.0)
Platelets: 170 10*3/uL (ref 150–400)
Platelets: 213 10*3/uL (ref 150–400)
RDW: 13.7 % (ref 11.5–15.5)
RDW: 14 % (ref 11.5–15.5)
WBC: 3.5 10*3/uL — ABNORMAL LOW (ref 4.0–10.5)
WBC: 3.8 10*3/uL — ABNORMAL LOW (ref 4.0–10.5)

## 2010-05-07 LAB — DIFFERENTIAL
Basophils Absolute: 0 10*3/uL (ref 0.0–0.1)
Basophils Absolute: 0 10*3/uL (ref 0.0–0.1)
Basophils Relative: 1 % (ref 0–1)
Eosinophils Relative: 4 % (ref 0–5)
Lymphocytes Relative: 27 % (ref 12–46)
Lymphocytes Relative: 34 % (ref 12–46)
Neutro Abs: 1.9 10*3/uL (ref 1.7–7.7)

## 2010-05-07 LAB — BASIC METABOLIC PANEL
BUN: 15 mg/dL (ref 6–23)
BUN: 21 mg/dL (ref 6–23)
Creatinine, Ser: 1.21 mg/dL — ABNORMAL HIGH (ref 0.4–1.2)
Creatinine, Ser: 1.34 mg/dL — ABNORMAL HIGH (ref 0.4–1.2)
GFR calc non Af Amer: 40 mL/min — ABNORMAL LOW (ref >60)
GFR calc non Af Amer: 45 mL/min — ABNORMAL LOW (ref >60)
Potassium: 4.4 mEq/L (ref 3.5–5.1)

## 2010-05-07 LAB — POCT CARDIAC MARKERS
CKMB, poc: <1 ng/mL — ABNORMAL LOW (ref 1.0–8.0)
Troponin i, poc: 0.05 ng/mL (ref 0.00–0.09)
Troponin i, poc: <0.05 ng/mL (ref 0.00–0.09)

## 2010-05-07 LAB — BRAIN NATRIURETIC PEPTIDE: Pro B Natriuretic peptide (BNP): 296 pg/mL — ABNORMAL HIGH (ref 0.0–100.0)

## 2010-05-09 LAB — CBC
HCT: 42.3 % (ref 36.0–46.0)
Hemoglobin: 14 g/dL (ref 12.0–15.0)
MCHC: 33.5 g/dL (ref 30.0–36.0)
MCHC: 33.8 g/dL (ref 30.0–36.0)
MCHC: 33.4 g/dL (ref 30.0–36.0)
MCV: 96.9 fL (ref 78.0–100.0)
MCV: 95.8 fL (ref 78.0–100.0)
Platelets: 181 10*3/uL (ref 150–400)
Platelets: 189 10*3/uL (ref 150–400)
Platelets: 151 10*3/uL (ref 150–400)
RBC: 4.4 MIL/uL (ref 3.87–5.11)
RDW: 14.1 % (ref 11.5–15.5)
RDW: 14.2 % (ref 11.5–15.5)
RDW: 14.7 % (ref 11.5–15.5)
RDW: 15 % (ref 11.5–15.5)
WBC: 4.3 10*3/uL (ref 4.0–10.5)
WBC: 2.7 10*3/uL — ABNORMAL LOW (ref 4.0–10.5)

## 2010-05-09 LAB — CARDIAC PANEL(CRET KIN+CKTOT+MB+TROPI)
CK, MB: 4.6 ng/mL — ABNORMAL HIGH (ref 0.3–4.0)
CK, MB: 5.4 ng/mL — ABNORMAL HIGH (ref 0.3–4.0)
CK, MB: 5.8 ng/mL — ABNORMAL HIGH (ref 0.3–4.0)
Relative Index: 2.8 — ABNORMAL HIGH (ref 0.0–2.5)
Relative Index: 2.9 — ABNORMAL HIGH (ref 0.0–2.5)
Total CK: 143 U/L (ref 7–177)
Troponin I: 0.15 ng/mL — ABNORMAL HIGH (ref 0.00–0.06)

## 2010-05-09 LAB — COMPREHENSIVE METABOLIC PANEL
ALT: 12 U/L (ref 0–35)
Albumin: 3.4 g/dL — ABNORMAL LOW (ref 3.5–5.2)
Alkaline Phosphatase: 62 U/L (ref 39–117)
BUN: 25 mg/dL — ABNORMAL HIGH (ref 6–23)
Calcium: 8.8 mg/dL (ref 8.4–10.5)
Potassium: 4 mEq/L (ref 3.5–5.1)
Sodium: 141 mEq/L (ref 135–145)
Total Protein: 6.5 g/dL (ref 6.0–8.3)

## 2010-05-09 LAB — POCT I-STAT, CHEM 8
BUN: 19 mg/dL (ref 6–23)
Calcium, Ion: 1.15 mmol/L (ref 1.12–1.32)
Chloride: 108 mEq/L (ref 96–112)
Creatinine, Ser: 1.3 mg/dL — ABNORMAL HIGH (ref 0.4–1.2)
Glucose, Bld: 92 mg/dL (ref 70–99)
HCT: 47 % — ABNORMAL HIGH (ref 36.0–46.0)
Potassium: 3.7 mEq/L (ref 3.5–5.1)

## 2010-05-09 LAB — DRUGS OF ABUSE SCREEN W/O ALC, ROUTINE URINE
Cocaine Metabolites: POSITIVE — AB
Creatinine,U: 138.6 mg/dL
Marijuana Metabolite: NEGATIVE
Opiate Screen, Urine: NEGATIVE
Propoxyphene: NEGATIVE

## 2010-05-09 LAB — BRAIN NATRIURETIC PEPTIDE: Pro B Natriuretic peptide (BNP): 467 pg/mL — ABNORMAL HIGH (ref 0.0–100.0)

## 2010-05-09 LAB — COCAINE, URINE, CONFIRMATION: Benzoylecgonine GC/MS Conf: 7600 ng/mL

## 2010-05-09 LAB — POCT CARDIAC MARKERS

## 2010-05-09 LAB — TSH
TSH: 0.157 u[IU]/mL — ABNORMAL LOW (ref 0.350–4.500)
TSH: 0.657 u[IU]/mL (ref 0.350–4.500)

## 2010-05-09 LAB — RAPID URINE DRUG SCREEN, HOSP PERFORMED
Amphetamines: NOT DETECTED
Cocaine: NOT DETECTED
Opiates: NOT DETECTED
Tetrahydrocannabinol: NOT DETECTED

## 2010-05-09 LAB — BASIC METABOLIC PANEL
BUN: 26 mg/dL — ABNORMAL HIGH (ref 6–23)
BUN: 15 mg/dL (ref 6–23)
CO2: 27 mEq/L (ref 19–32)
Chloride: 108 mEq/L (ref 96–112)
Chloride: 102 mEq/L (ref 96–112)
Creatinine, Ser: 1.49 mg/dL — ABNORMAL HIGH (ref 0.4–1.2)
Creatinine, Ser: 0.98 mg/dL (ref 0.4–1.2)
Glucose, Bld: 74 mg/dL (ref 70–99)

## 2010-05-09 LAB — TROPONIN I: Troponin I: 0.26 ng/mL — ABNORMAL HIGH (ref 0.00–0.06)

## 2010-05-09 LAB — CK TOTAL AND CKMB (NOT AT ARMC): Total CK: 195 U/L — ABNORMAL HIGH (ref 7–177)

## 2010-05-10 ENCOUNTER — Inpatient Hospital Stay: Payer: Medicaid Other | Admitting: Internal Medicine

## 2010-05-10 LAB — URINE CULTURE: Culture: NO GROWTH

## 2010-05-10 LAB — CULTURE, BLOOD (ROUTINE X 2): Culture: NO GROWTH

## 2010-05-10 LAB — URINALYSIS, ROUTINE W REFLEX MICROSCOPIC
Ketones, ur: NEGATIVE mg/dL
Nitrite: NEGATIVE
Protein, ur: 30 mg/dL — AB
Urobilinogen, UA: 0.2 mg/dL (ref 0.0–1.0)
pH: 5.5 (ref 5.0–8.0)

## 2010-05-10 LAB — RAPID URINE DRUG SCREEN, HOSP PERFORMED
Barbiturates: NOT DETECTED
Cocaine: POSITIVE — AB
Opiates: NOT DETECTED

## 2010-05-12 LAB — DIFFERENTIAL
Basophils Absolute: 0 10*3/uL (ref 0.0–0.1)
Basophils Absolute: 0 10*3/uL (ref 0.0–0.1)
Basophils Relative: 0 % (ref 0–1)
Basophils Relative: 0 % (ref 0–1)
Eosinophils Relative: 2 % (ref 0–5)
Eosinophils Relative: 5 % (ref 0–5)
Eosinophils Relative: 5 % (ref 0–5)
Lymphocytes Relative: 20 % (ref 12–46)
Lymphocytes Relative: 13 % (ref 12–46)
Lymphs Abs: 0.8 10*3/uL (ref 0.7–4.0)
Monocytes Absolute: 0.6 10*3/uL (ref 0.1–1.0)
Monocytes Absolute: 0.5 10*3/uL (ref 0.1–1.0)
Monocytes Relative: 11 % (ref 3–12)
Neutro Abs: 2.7 10*3/uL (ref 1.7–7.7)
Neutro Abs: 3.1 10*3/uL (ref 1.7–7.7)
Neutro Abs: 3.8 10*3/uL (ref 1.7–7.7)

## 2010-05-12 LAB — URINE MICROSCOPIC-ADD ON

## 2010-05-12 LAB — CBC
HCT: 37 % (ref 36.0–46.0)
HCT: 39 % (ref 36.0–46.0)
HCT: 45.1 % (ref 36.0–46.0)
HCT: 33.2 % — ABNORMAL LOW (ref 36.0–46.0)
HCT: 36.7 % (ref 36.0–46.0)
Hemoglobin: 12 g/dL (ref 12.0–15.0)
Hemoglobin: 13.1 g/dL (ref 12.0–15.0)
Hemoglobin: 11.3 g/dL — ABNORMAL LOW (ref 12.0–15.0)
MCHC: 32.3 g/dL (ref 30.0–36.0)
MCHC: 33.7 g/dL (ref 30.0–36.0)
MCHC: 34 g/dL (ref 30.0–36.0)
MCV: 94.3 fL (ref 78.0–100.0)
Platelets: 207 10*3/uL (ref 150–400)
Platelets: 153 10*3/uL (ref 150–400)
Platelets: 179 10*3/uL (ref 150–400)
RBC: 3.49 MIL/uL — ABNORMAL LOW (ref 3.87–5.11)
RDW: 14.9 % (ref 11.5–15.5)
RDW: 15.5 % (ref 11.5–15.5)
RDW: 14.3 % (ref 11.5–15.5)
RDW: 14.6 % (ref 11.5–15.5)
WBC: 4.2 10*3/uL (ref 4.0–10.5)
WBC: 4.7 10*3/uL (ref 4.0–10.5)

## 2010-05-12 LAB — COMPREHENSIVE METABOLIC PANEL
ALT: 14 U/L (ref 0–35)
AST: 25 U/L (ref 0–37)
AST: 27 U/L (ref 0–37)
Albumin: 3.3 g/dL — ABNORMAL LOW (ref 3.5–5.2)
Albumin: 3.8 g/dL (ref 3.5–5.2)
Alkaline Phosphatase: 60 U/L (ref 39–117)
Alkaline Phosphatase: 58 U/L (ref 39–117)
BUN: 16 mg/dL (ref 6–23)
BUN: 22 mg/dL (ref 6–23)
Calcium: 8.5 mg/dL (ref 8.4–10.5)
Chloride: 102 mEq/L (ref 96–112)
Chloride: 108 mEq/L (ref 96–112)
Creatinine, Ser: 2.26 mg/dL — ABNORMAL HIGH (ref 0.4–1.2)
GFR calc Af Amer: 26 mL/min — ABNORMAL LOW (ref >60)
GFR calc Af Amer: 29 mL/min — ABNORMAL LOW (ref >60)
Glucose, Bld: 75 mg/dL (ref 70–99)
Potassium: 4.2 mEq/L (ref 3.5–5.1)
Potassium: 3.6 mEq/L (ref 3.5–5.1)
Potassium: 4.7 mEq/L (ref 3.5–5.1)
Sodium: 139 mEq/L (ref 135–145)
Total Bilirubin: 0.6 mg/dL (ref 0.3–1.2)
Total Bilirubin: 0.6 mg/dL (ref 0.3–1.2)
Total Protein: 5.7 g/dL — ABNORMAL LOW (ref 6.0–8.3)
Total Protein: 6.3 g/dL (ref 6.0–8.3)
Total Protein: 7.6 g/dL (ref 6.0–8.3)

## 2010-05-12 LAB — RAPID URINE DRUG SCREEN, HOSP PERFORMED
Amphetamines: NOT DETECTED
Benzodiazepines: NOT DETECTED
Cocaine: POSITIVE — AB
Opiates: NOT DETECTED
Tetrahydrocannabinol: NOT DETECTED

## 2010-05-12 LAB — BASIC METABOLIC PANEL
BUN: 11 mg/dL (ref 6–23)
BUN: 13 mg/dL (ref 6–23)
CO2: 24 mEq/L (ref 19–32)
CO2: 26 mEq/L (ref 19–32)
Calcium: 9 mg/dL (ref 8.4–10.5)
Calcium: 8.8 mg/dL (ref 8.4–10.5)
Chloride: 100 mEq/L (ref 96–112)
Chloride: 106 mEq/L (ref 96–112)
Creatinine, Ser: 1.22 mg/dL — ABNORMAL HIGH (ref 0.4–1.2)
Creatinine, Ser: 1.22 mg/dL — ABNORMAL HIGH (ref 0.4–1.2)
GFR calc Af Amer: 33 mL/min — ABNORMAL LOW (ref >60)
GFR calc non Af Amer: 44 mL/min — ABNORMAL LOW (ref >60)
GFR calc non Af Amer: 28 mL/min — ABNORMAL LOW (ref >60)
Glucose, Bld: 104 mg/dL — ABNORMAL HIGH (ref 70–99)
Glucose, Bld: 97 mg/dL (ref 70–99)
Glucose, Bld: 87 mg/dL (ref 70–99)
Potassium: 3.9 mEq/L (ref 3.5–5.1)
Potassium: 4.3 mEq/L (ref 3.5–5.1)
Sodium: 138 mEq/L (ref 135–145)
Sodium: 142 mEq/L (ref 135–145)

## 2010-05-12 LAB — POCT I-STAT, CHEM 8
BUN: 24 mg/dL — ABNORMAL HIGH (ref 6–23)
Calcium, Ion: 1.16 mmol/L (ref 1.12–1.32)
Chloride: 109 mEq/L (ref 96–112)
Chloride: 109 mEq/L (ref 96–112)
Creatinine, Ser: 1.1 mg/dL (ref 0.4–1.2)
Creatinine, Ser: 1.4 mg/dL — ABNORMAL HIGH (ref 0.4–1.2)
Glucose, Bld: 111 mg/dL — ABNORMAL HIGH (ref 70–99)
Potassium: 3.9 mEq/L (ref 3.5–5.1)
Sodium: 140 mEq/L (ref 135–145)
TCO2: 25 mmol/L (ref 0–100)

## 2010-05-12 LAB — HEMOGLOBIN A1C: Hgb A1c MFr Bld: 5.9 % (ref 4.6–6.1)

## 2010-05-12 LAB — LIPID PANEL
Cholesterol: 157 mg/dL (ref 0–200)
LDL Cholesterol: 57 mg/dL (ref 0–99)
Total CHOL/HDL Ratio: 1.7 RATIO

## 2010-05-12 LAB — POCT CARDIAC MARKERS
Myoglobin, poc: 74.7 ng/mL (ref 12–200)
Troponin i, poc: 0.09 ng/mL (ref 0.00–0.09)

## 2010-05-12 LAB — BRAIN NATRIURETIC PEPTIDE
Pro B Natriuretic peptide (BNP): 2191 pg/mL — ABNORMAL HIGH (ref 0.0–100.0)
Pro B Natriuretic peptide (BNP): 2441 pg/mL — ABNORMAL HIGH (ref 0.0–100.0)
Pro B Natriuretic peptide (BNP): 2971 pg/mL — ABNORMAL HIGH (ref 0.0–100.0)

## 2010-05-12 LAB — URINE CULTURE

## 2010-05-12 LAB — URINE DRUGS OF ABUSE SCREEN W ALC, ROUTINE (REF LAB)
Amphetamine Screen, Ur: NEGATIVE
Creatinine,U: 78.4 mg/dL
Ethyl Alcohol: <10 mg/dL (ref <10)
Marijuana Metabolite: NEGATIVE
Methadone: NEGATIVE
Opiate Screen, Urine: NEGATIVE

## 2010-05-12 LAB — URINALYSIS, ROUTINE W REFLEX MICROSCOPIC
Glucose, UA: NEGATIVE mg/dL
Glucose, UA: NEGATIVE mg/dL
Hgb urine dipstick: NEGATIVE
Ketones, ur: NEGATIVE mg/dL
Nitrite: NEGATIVE
Protein, ur: 100 mg/dL — AB
Protein, ur: 30 mg/dL — AB
pH: 6.5 (ref 5.0–8.0)

## 2010-05-12 LAB — GLUCOSE, CAPILLARY
Glucose-Capillary: 101 mg/dL — ABNORMAL HIGH (ref 70–99)
Glucose-Capillary: 110 mg/dL — ABNORMAL HIGH (ref 70–99)
Glucose-Capillary: 71 mg/dL (ref 70–99)
Glucose-Capillary: 75 mg/dL (ref 70–99)
Glucose-Capillary: 87 mg/dL (ref 70–99)
Glucose-Capillary: 99 mg/dL (ref 70–99)

## 2010-05-12 LAB — LEGIONELLA ANTIGEN, URINE: Legionella Antigen, Urine: NEGATIVE

## 2010-05-12 LAB — VITAMIN B12: Vitamin B-12: 495 pg/mL (ref 211–911)

## 2010-05-12 LAB — APTT: aPTT: 40 seconds — ABNORMAL HIGH (ref 24–37)

## 2010-05-12 LAB — TSH: TSH: 1.575 u[IU]/mL (ref 0.350–4.500)

## 2010-05-19 ENCOUNTER — Encounter: Payer: Self-pay | Admitting: Internal Medicine

## 2010-05-21 ENCOUNTER — Inpatient Hospital Stay (HOSPITAL_COMMUNITY): Payer: Medicare Other

## 2010-05-21 ENCOUNTER — Inpatient Hospital Stay (HOSPITAL_COMMUNITY)
Admission: EM | Admit: 2010-05-21 | Discharge: 2010-05-23 | DRG: 190 | Disposition: A | Discharge status: Home / Self Care | Payer: Medicare Other | Attending: Internal Medicine | Admitting: Internal Medicine

## 2010-05-21 ENCOUNTER — Emergency Department (HOSPITAL_COMMUNITY): Payer: Medicare Other

## 2010-05-21 DIAGNOSIS — N183 Chronic kidney disease, stage 3 unspecified: Secondary | ICD-10-CM | POA: Diagnosis present

## 2010-05-21 DIAGNOSIS — I219 Acute myocardial infarction, unspecified: Secondary | ICD-10-CM

## 2010-05-21 DIAGNOSIS — I214 Non-ST elevation (NSTEMI) myocardial infarction: Secondary | ICD-10-CM | POA: Diagnosis present

## 2010-05-21 DIAGNOSIS — I2510 Atherosclerotic heart disease of native coronary artery without angina pectoris: Secondary | ICD-10-CM | POA: Diagnosis present

## 2010-05-21 DIAGNOSIS — I428 Other cardiomyopathies: Secondary | ICD-10-CM | POA: Diagnosis present

## 2010-05-21 DIAGNOSIS — T43601A Poisoning by unspecified psychostimulants, accidental (unintentional), initial encounter: Secondary | ICD-10-CM | POA: Diagnosis present

## 2010-05-21 DIAGNOSIS — Z7902 Long term (current) use of antithrombotics/antiplatelets: Secondary | ICD-10-CM

## 2010-05-21 DIAGNOSIS — I509 Heart failure, unspecified: Secondary | ICD-10-CM | POA: Diagnosis present

## 2010-05-21 DIAGNOSIS — Z8673 Personal history of transient ischemic attack (TIA), and cerebral infarction without residual deficits: Secondary | ICD-10-CM

## 2010-05-21 DIAGNOSIS — E785 Hyperlipidemia, unspecified: Secondary | ICD-10-CM | POA: Diagnosis present

## 2010-05-21 DIAGNOSIS — T405X1A Poisoning by cocaine, accidental (unintentional), initial encounter: Secondary | ICD-10-CM | POA: Diagnosis present

## 2010-05-21 DIAGNOSIS — F1410 Cocaine abuse, uncomplicated: Secondary | ICD-10-CM | POA: Diagnosis present

## 2010-05-21 DIAGNOSIS — J441 Chronic obstructive pulmonary disease with (acute) exacerbation: Principal | ICD-10-CM | POA: Diagnosis present

## 2010-05-21 DIAGNOSIS — I129 Hypertensive chronic kidney disease with stage 1 through stage 4 chronic kidney disease, or unspecified chronic kidney disease: Secondary | ICD-10-CM | POA: Diagnosis present

## 2010-05-21 DIAGNOSIS — Z853 Personal history of malignant neoplasm of breast: Secondary | ICD-10-CM

## 2010-05-21 DIAGNOSIS — Z7982 Long term (current) use of aspirin: Secondary | ICD-10-CM

## 2010-05-21 DIAGNOSIS — I447 Left bundle-branch block, unspecified: Secondary | ICD-10-CM | POA: Diagnosis present

## 2010-05-21 DIAGNOSIS — Z87891 Personal history of nicotine dependence: Secondary | ICD-10-CM

## 2010-05-21 HISTORY — DX: Acute myocardial infarction, unspecified: I21.9

## 2010-05-21 LAB — GLUCOSE, CAPILLARY
Glucose-Capillary: 142 mg/dL — ABNORMAL HIGH (ref 70–99)
Glucose-Capillary: 259 mg/dL — ABNORMAL HIGH (ref 70–99)

## 2010-05-21 LAB — RAPID URINE DRUG SCREEN, HOSP PERFORMED
Amphetamines: NOT DETECTED
Barbiturates: NOT DETECTED
Benzodiazepines: NOT DETECTED
Opiates: POSITIVE — AB

## 2010-05-21 LAB — CBC
Hemoglobin: 14 g/dL (ref 12.0–15.0)
MCHC: 32.7 g/dL (ref 30.0–36.0)
RBC: 4.5 MIL/uL (ref 3.87–5.11)
WBC: 6 10*3/uL (ref 4.0–10.5)

## 2010-05-21 LAB — POCT CARDIAC MARKERS: Myoglobin, poc: 111 ng/mL (ref 12–200)

## 2010-05-21 LAB — TSH: TSH: 0.39 u[IU]/mL (ref 0.350–4.500)

## 2010-05-21 LAB — CK TOTAL AND CKMB (NOT AT ARMC)
CK, MB: 9.8 ng/mL (ref 0.3–4.0)
Total CK: 200 U/L — ABNORMAL HIGH (ref 7–177)
Total CK: 212 U/L — ABNORMAL HIGH (ref 7–177)

## 2010-05-21 LAB — DIFFERENTIAL
Basophils Absolute: 0 10*3/uL (ref 0.0–0.1)
Basophils Relative: 0 % (ref 0–1)
Monocytes Absolute: 0.6 10*3/uL (ref 0.1–1.0)
Neutro Abs: 2 10*3/uL (ref 1.7–7.7)
Neutrophils Relative %: 33 % — ABNORMAL LOW (ref 43–77)

## 2010-05-21 LAB — COMPREHENSIVE METABOLIC PANEL
BUN: 14 mg/dL (ref 6–23)
CO2: 22 mEq/L (ref 19–32)
Chloride: 106 mEq/L (ref 96–112)
Creatinine, Ser: 1.37 mg/dL — ABNORMAL HIGH (ref 0.4–1.2)
GFR calc non Af Amer: 39 mL/min — ABNORMAL LOW (ref >60)
Total Bilirubin: 0.6 mg/dL (ref 0.3–1.2)

## 2010-05-21 LAB — BASIC METABOLIC PANEL
CO2: 20 mEq/L (ref 19–32)
Chloride: 108 mEq/L (ref 96–112)
GFR calc Af Amer: 42 mL/min — ABNORMAL LOW (ref >60)
Sodium: 142 mEq/L (ref 135–145)

## 2010-05-21 MED ORDER — XENON XE 133 GAS
10.0000 | GAS_FOR_INHALATION | Freq: Once | RESPIRATORY_TRACT | Status: AC | PRN
  Administered 2010-05-21: 10 via RESPIRATORY_TRACT

## 2010-05-21 MED ORDER — TECHNETIUM TO 99M ALBUMIN AGGREGATED
6.0000 | Freq: Once | INTRAVENOUS | Status: AC | PRN
  Administered 2010-05-21: 6 via INTRAVENOUS

## 2010-05-22 LAB — BASIC METABOLIC PANEL
GFR calc Af Amer: 49 mL/min — ABNORMAL LOW (ref >60)
GFR calc non Af Amer: 41 mL/min — ABNORMAL LOW (ref >60)
Glucose, Bld: 106 mg/dL — ABNORMAL HIGH (ref 70–99)
Potassium: 3.9 mEq/L (ref 3.5–5.1)
Sodium: 141 mEq/L (ref 135–145)

## 2010-05-22 LAB — DIFFERENTIAL
Basophils Absolute: 0 10*3/uL (ref 0.0–0.1)
Basophils Relative: 0 % (ref 0–1)
Eosinophils Relative: 0 % (ref 0–5)
Lymphocytes Relative: 7 % — ABNORMAL LOW (ref 12–46)

## 2010-05-22 LAB — CBC
HCT: 37 % (ref 36.0–46.0)
Platelets: 183 10*3/uL (ref 150–400)
RDW: 13.4 % (ref 11.5–15.5)
WBC: 9.1 10*3/uL (ref 4.0–10.5)

## 2010-05-22 LAB — GLUCOSE, CAPILLARY
Glucose-Capillary: 149 mg/dL — ABNORMAL HIGH (ref 70–99)
Glucose-Capillary: 127 mg/dL — ABNORMAL HIGH (ref 70–99)

## 2010-05-23 LAB — BASIC METABOLIC PANEL
CO2: 24 mEq/L (ref 19–32)
Calcium: 8.7 mg/dL (ref 8.4–10.5)
GFR calc Af Amer: 43 mL/min — ABNORMAL LOW (ref >60)
GFR calc non Af Amer: 36 mL/min — ABNORMAL LOW (ref >60)
Sodium: 141 mEq/L (ref 135–145)

## 2010-05-23 LAB — CBC
Hemoglobin: 11.8 g/dL — ABNORMAL LOW (ref 12.0–15.0)
MCHC: 33.1 g/dL (ref 30.0–36.0)

## 2010-05-23 LAB — DIFFERENTIAL
Basophils Absolute: 0 10*3/uL (ref 0.0–0.1)
Basophils Relative: 0 % (ref 0–1)
Monocytes Absolute: 0.8 10*3/uL (ref 0.1–1.0)
Neutro Abs: 8.9 10*3/uL — ABNORMAL HIGH (ref 1.7–7.7)
Neutrophils Relative %: 89 % — ABNORMAL HIGH (ref 43–77)

## 2010-05-23 LAB — GLUCOSE, CAPILLARY: Glucose-Capillary: 157 mg/dL — ABNORMAL HIGH (ref 70–99)

## 2010-05-24 LAB — CBC
HCT: 36.7 % (ref 36.0–46.0)
HCT: 36.7 % (ref 36.0–46.0)
Hemoglobin: 12.1 g/dL (ref 12.0–15.0)
MCHC: 32.8 g/dL (ref 30.0–36.0)
MCV: 94.2 fL (ref 78.0–100.0)
MCV: 94.6 fL (ref 78.0–100.0)
Platelets: 167 10*3/uL (ref 150–400)
Platelets: 193 10*3/uL (ref 150–400)
RBC: 3.89 MIL/uL (ref 3.87–5.11)
RDW: 14.5 % (ref 11.5–15.5)
WBC: 3.2 10*3/uL — ABNORMAL LOW (ref 4.0–10.5)
WBC: 3.3 10*3/uL — ABNORMAL LOW (ref 4.0–10.5)

## 2010-05-24 LAB — COMPREHENSIVE METABOLIC PANEL
ALT: 13 U/L (ref 0–35)
ALT: 16 U/L (ref 0–35)
AST: 30 U/L (ref 0–37)
AST: 34 U/L (ref 0–37)
Albumin: 3.6 g/dL (ref 3.5–5.2)
Alkaline Phosphatase: 65 U/L (ref 39–117)
Alkaline Phosphatase: 71 U/L (ref 39–117)
BUN: 17 mg/dL (ref 6–23)
CO2: 27 mEq/L (ref 19–32)
Calcium: 9.1 mg/dL (ref 8.4–10.5)
Chloride: 103 mEq/L (ref 96–112)
Chloride: 104 mEq/L (ref 96–112)
Creatinine, Ser: 1.29 mg/dL — ABNORMAL HIGH (ref 0.4–1.2)
Creatinine, Ser: 1.4 mg/dL — ABNORMAL HIGH (ref 0.4–1.2)
GFR calc Af Amer: 46 mL/min — ABNORMAL LOW (ref >60)
GFR calc Af Amer: 46 mL/min — ABNORMAL LOW (ref >60)
GFR calc non Af Amer: 42 mL/min — ABNORMAL LOW (ref >60)
Glucose, Bld: 107 mg/dL — ABNORMAL HIGH (ref 70–99)
Glucose, Bld: 75 mg/dL (ref 70–99)
Potassium: 4.3 mEq/L (ref 3.5–5.1)
Potassium: 5.3 mEq/L — ABNORMAL HIGH (ref 3.5–5.1)
Sodium: 135 mEq/L (ref 135–145)
Sodium: 138 mEq/L (ref 135–145)
Total Bilirubin: 0.8 mg/dL (ref 0.3–1.2)
Total Bilirubin: 0.8 mg/dL (ref 0.3–1.2)
Total Protein: 6.5 g/dL (ref 6.0–8.3)

## 2010-05-24 LAB — DIFFERENTIAL
Basophils Absolute: 0 10*3/uL (ref 0.0–0.1)
Basophils Absolute: 0 10*3/uL (ref 0.0–0.1)
Basophils Relative: 1 % (ref 0–1)
Basophils Relative: 1 % (ref 0–1)
Eosinophils Absolute: 0.3 10*3/uL (ref 0.0–0.7)
Eosinophils Relative: 10 % — ABNORMAL HIGH (ref 0–5)
Lymphocytes Relative: 24 % (ref 12–46)
Lymphocytes Relative: 26 % (ref 12–46)
Lymphs Abs: 0.8 10*3/uL (ref 0.7–4.0)
Monocytes Absolute: 0.4 10*3/uL (ref 0.1–1.0)
Monocytes Absolute: 0.4 10*3/uL (ref 0.1–1.0)
Monocytes Relative: 12 % (ref 3–12)
Neutro Abs: 1.8 10*3/uL (ref 1.7–7.7)
Neutrophils Relative %: 53 % (ref 43–77)
Neutrophils Relative %: 55 % (ref 43–77)

## 2010-05-24 LAB — MAGNESIUM
Magnesium: 1.8 mg/dL (ref 1.5–2.5)
Magnesium: 2.1 mg/dL (ref 1.5–2.5)

## 2010-05-25 LAB — URINE DRUGS OF ABUSE SCREEN W ALC, ROUTINE (REF LAB)
Amphetamine Screen, Ur: NEGATIVE
Barbiturate Quant, Ur: NEGATIVE
Cocaine Metabolites: POSITIVE — AB
Creatinine,U: 103.3 mg/dL
Marijuana Metabolite: NEGATIVE

## 2010-05-25 LAB — COMPREHENSIVE METABOLIC PANEL
ALT: 12 U/L (ref 0–35)
AST: 23 U/L (ref 0–37)
CO2: 25 mEq/L (ref 19–32)
Chloride: 110 mEq/L (ref 96–112)
Creatinine, Ser: 1.66 mg/dL — ABNORMAL HIGH (ref 0.4–1.2)
GFR calc Af Amer: 38 mL/min — ABNORMAL LOW (ref >60)
GFR calc non Af Amer: 31 mL/min — ABNORMAL LOW (ref >60)
Glucose, Bld: 82 mg/dL (ref 70–99)
Sodium: 140 mEq/L (ref 135–145)
Total Bilirubin: 0.7 mg/dL (ref 0.3–1.2)

## 2010-05-25 LAB — DIFFERENTIAL
Basophils Absolute: 0 10*3/uL (ref 0.0–0.1)
Basophils Relative: 1 % (ref 0–1)
Eosinophils Absolute: 0.4 10*3/uL (ref 0.0–0.7)
Eosinophils Relative: 12 % — ABNORMAL HIGH (ref 0–5)
Neutrophils Relative %: 54 % (ref 43–77)

## 2010-05-25 LAB — CBC
Hemoglobin: 11.7 g/dL — ABNORMAL LOW (ref 12.0–15.0)
MCHC: 32.7 g/dL (ref 30.0–36.0)
MCHC: 32.9 g/dL (ref 30.0–36.0)
MCV: 94.2 fL (ref 78.0–100.0)
MCV: 94.8 fL (ref 78.0–100.0)
Platelets: 161 10*3/uL (ref 150–400)
RBC: 3.79 MIL/uL — ABNORMAL LOW (ref 3.87–5.11)
WBC: 3.1 10*3/uL — ABNORMAL LOW (ref 4.0–10.5)
WBC: 3.3 10*3/uL — ABNORMAL LOW (ref 4.0–10.5)

## 2010-05-25 LAB — BENZODIAZEPINE, QUANTITATIVE, URINE: Nordiazepam GC/MS Conf: NEGATIVE

## 2010-05-25 LAB — HEPARIN LEVEL (UNFRACTIONATED)
Heparin Unfractionated: 0.21 IU/mL — ABNORMAL LOW (ref 0.30–0.70)
Heparin Unfractionated: 0.79 IU/mL — ABNORMAL HIGH (ref 0.30–0.70)

## 2010-05-25 LAB — TSH: TSH: 1.12 u[IU]/mL (ref 0.350–4.500)

## 2010-05-25 LAB — CK TOTAL AND CKMB (NOT AT ARMC)
CK, MB: 4.3 ng/mL — ABNORMAL HIGH (ref 0.3–4.0)
Relative Index: 2.6 — ABNORMAL HIGH (ref 0.0–2.5)

## 2010-05-25 LAB — CARDIAC PANEL(CRET KIN+CKTOT+MB+TROPI)
Total CK: 142 U/L (ref 7–177)
Troponin I: 0.57 ng/mL (ref 0.00–0.06)

## 2010-05-26 LAB — CBC
HCT: 37.1 % (ref 36.0–46.0)
Hemoglobin: 12.6 g/dL (ref 12.0–15.0)
MCHC: 33.4 g/dL (ref 30.0–36.0)
MCHC: 33.6 g/dL (ref 30.0–36.0)
MCHC: 34.1 g/dL (ref 30.0–36.0)
MCV: 95.1 fL (ref 78.0–100.0)
MCV: 95.4 fL (ref 78.0–100.0)
Platelets: 205 10*3/uL (ref 150–400)
Platelets: 232 10*3/uL (ref 150–400)
Platelets: 239 10*3/uL (ref 150–400)
RBC: 3.89 MIL/uL (ref 3.87–5.11)
RBC: 4.14 MIL/uL (ref 3.87–5.11)
RBC: 4.25 MIL/uL (ref 3.87–5.11)
RBC: 4.55 MIL/uL (ref 3.87–5.11)
WBC: 4 10*3/uL (ref 4.0–10.5)
WBC: 4.3 10*3/uL (ref 4.0–10.5)
WBC: 6 10*3/uL (ref 4.0–10.5)
WBC: 6.6 10*3/uL (ref 4.0–10.5)

## 2010-05-26 LAB — LIPID PANEL
Cholesterol: 215 mg/dL — ABNORMAL HIGH (ref 0–200)
HDL: 125 mg/dL (ref >39)
Total CHOL/HDL Ratio: 1.7 RATIO
Triglycerides: 59 mg/dL (ref <150)

## 2010-05-26 LAB — DIFFERENTIAL
Lymphocytes Relative: 23 % (ref 12–46)
Lymphs Abs: 0.9 10*3/uL (ref 0.7–4.0)
Monocytes Relative: 9 % (ref 3–12)
Neutrophils Relative %: 62 % (ref 43–77)

## 2010-05-26 LAB — OPIATE, QUANTITATIVE, URINE: Oxycodone, ur: NEGATIVE ng/mL

## 2010-05-26 LAB — TROPONIN I: Troponin I: 4.52 ng/mL (ref 0.00–0.06)

## 2010-05-26 LAB — URINE MICROSCOPIC-ADD ON

## 2010-05-26 LAB — POCT I-STAT, CHEM 8
BUN: 27 mg/dL — ABNORMAL HIGH (ref 6–23)
Chloride: 110 mEq/L (ref 96–112)
Creatinine, Ser: 1.1 mg/dL (ref 0.4–1.2)
Glucose, Bld: 92 mg/dL (ref 70–99)
Hemoglobin: 13.9 g/dL (ref 12.0–15.0)
Potassium: 4.1 mEq/L (ref 3.5–5.1)
Sodium: 140 mEq/L (ref 135–145)

## 2010-05-26 LAB — COMPREHENSIVE METABOLIC PANEL
BUN: 37 mg/dL — ABNORMAL HIGH (ref 6–23)
CO2: 26 mEq/L (ref 19–32)
Calcium: 8.8 mg/dL (ref 8.4–10.5)
Chloride: 107 mEq/L (ref 96–112)
Creatinine, Ser: 2 mg/dL — ABNORMAL HIGH (ref 0.4–1.2)
GFR calc non Af Amer: 25 mL/min — ABNORMAL LOW (ref >60)
Glucose, Bld: UNDETERMINED mg/dL (ref 70–99)
Total Bilirubin: UNDETERMINED mg/dL (ref 0.3–1.2)

## 2010-05-26 LAB — BENZODIAZEPINE, QUANTITATIVE, URINE
Alprazolam (GC/LC/MS), ur confirm: NEGATIVE
Flurazepam GC/MS Conf: NEGATIVE
Nordiazepam GC/MS Conf: NEGATIVE
Oxazepam GC/MS Conf: NEGATIVE

## 2010-05-26 LAB — URINALYSIS, ROUTINE W REFLEX MICROSCOPIC
Bilirubin Urine: NEGATIVE
Glucose, UA: NEGATIVE mg/dL
Ketones, ur: NEGATIVE mg/dL
Protein, ur: NEGATIVE mg/dL
Urobilinogen, UA: 0.2 mg/dL (ref 0.0–1.0)

## 2010-05-26 LAB — BASIC METABOLIC PANEL
BUN: 32 mg/dL — ABNORMAL HIGH (ref 6–23)
CO2: 26 mEq/L (ref 19–32)
CO2: 31 mEq/L (ref 19–32)
Calcium: 8.6 mg/dL (ref 8.4–10.5)
Calcium: 9.9 mg/dL (ref 8.4–10.5)
Chloride: 100 mEq/L (ref 96–112)
Chloride: 104 mEq/L (ref 96–112)
Chloride: 99 mEq/L (ref 96–112)
Creatinine, Ser: 1.56 mg/dL — ABNORMAL HIGH (ref 0.4–1.2)
GFR calc Af Amer: 39 mL/min — ABNORMAL LOW (ref >60)
GFR calc Af Amer: 41 mL/min — ABNORMAL LOW (ref >60)
GFR calc Af Amer: 47 mL/min — ABNORMAL LOW (ref >60)
GFR calc non Af Amer: 32 mL/min — ABNORMAL LOW (ref >60)
Potassium: 4.7 mEq/L (ref 3.5–5.1)
Sodium: 134 mEq/L — ABNORMAL LOW (ref 135–145)
Sodium: 139 mEq/L (ref 135–145)

## 2010-05-26 LAB — CARDIAC PANEL(CRET KIN+CKTOT+MB+TROPI)
CK, MB: 3.6 ng/mL (ref 0.3–4.0)
CK, MB: 34 ng/mL — ABNORMAL HIGH (ref 0.3–4.0)
Relative Index: INVALID (ref 0.0–2.5)
Total CK: 117 U/L (ref 7–177)
Total CK: 327 U/L — ABNORMAL HIGH (ref 7–177)
Total CK: 466 U/L — ABNORMAL HIGH (ref 7–177)
Troponin I: 12.79 ng/mL (ref 0.00–0.06)
Troponin I: 17.62 ng/mL (ref 0.00–0.06)
Troponin I: 19.3 ng/mL (ref 0.00–0.06)
Troponin I: 20.4 ng/mL (ref 0.00–0.06)

## 2010-05-26 LAB — DRUGS OF ABUSE SCREEN W/O ALC, ROUTINE URINE
Benzodiazepines.: POSITIVE — AB
Creatinine,U: 62.5 mg/dL
Opiate Screen, Urine: POSITIVE — AB

## 2010-05-26 LAB — POCT CARDIAC MARKERS
CKMB, poc: 4.9 ng/mL (ref 1.0–8.0)
Troponin i, poc: 0.14 ng/mL — ABNORMAL HIGH (ref 0.00–0.09)

## 2010-05-26 LAB — CK TOTAL AND CKMB (NOT AT ARMC)
CK, MB: 56.3 ng/mL — ABNORMAL HIGH (ref 0.3–4.0)
Relative Index: 9.2 — ABNORMAL HIGH (ref 0.0–2.5)

## 2010-05-27 NOTE — Discharge Summary (Signed)
NAME:  [name], [name]              ACCOUNT NO.:  [id number]  MEDICAL RECORD NO.:  [id number]           PATIENT TYPE:  I  LOCATION:  3742                         FACILITY:  [facility]  PHYSICIAN:  Melchor Kirchgessner N. Sharyn Lull, M.D. DATE OF BIRTH:  1946-01-05  DATE OF ADMISSION:  04/15/2010 DATE OF DISCHARGE:  04/23/2010                              DISCHARGE SUMMARY   ADMITTING DIAGNOSES:  Atypical chest pain, rule out myocardial infarction; severe nonischemic cardiomyopathy, history of non-Q-wave myocardial infarction in the past secondary to vasospasm secondary to cocaine abuse, hypertension, chronic obstructive pulmonary disease, history of polysubstance abuse, history of tobacco abuse, mild renal insufficiency, history of schizophrenia, hypercholesterolemia, and history of cancer of breast.  DISCHARGE DIAGNOSES:  Status post atypical chest pain, myocardial infarction ruled out, negative Persantine Myoview, severe nonischemic dilated cardiomyopathy, compensated systolic heart failure, history of non-Q-wave myocardial infarction in the past secondary to vasospasm secondary to cocaine abuse, resolving exacerbation of chronic obstructive pulmonary disease, hypertension, tobacco abuse, polysubstance abuse, mild renal insufficiency, and history of schizophrenia.  DISCHARGE/HOME MEDICATIONS: 1. Carvedilol 3.125 mg 1 tablet twice daily. 2. Lisinopril 10 mg 1 tablet daily. 3. Lasix 40 mg 1 tablet daily. 4. Isosorbide mononitrate extended release 1 tablet daily. 5. Plavix 75 mg 1 tablet daily. 6. Zyprexa 2.5 mg 1 tablet daily. 7. Symbicort 80/4.5 mcg 2 puffs twice daily. 8. Doxycycline 100 mg one tablet twice daily for 5 days. 9. Prednisone 20 mg 1 tablet daily for 3 days, prednisone 10 mg 1     tablet daily for 3 days, and then 5 mg 1 tablet daily. 10.Albuterol inhaler by nebulizer 2.5 mg/3 mL one vial every 6 hours     as needed. 11.Ambien 5 mg 1 tablet daily at night as-needed. 12.Nitrostat  0.4 mg every 5 minutes x3 as needed. 13.Seroquel 50 mg 1 tablet daily at night. 14.Simvastatin 10 mg 1 tablet daily at night. 15.Spiriva 18 mcg 1 inhalation daily. 16.Trazodone 50 mg 1 tablet daily at night.  DIET:  Low-salt, low-cholesterol.  DISCHARGE INSTRUCTIONS:  The patient has been advised to monitor weight and restrict fluid to 1 liter per 24 hours.  Heart failure instructions have been given.  The patient has been advised to follow up with me in 1 week and Dr. Marchelle Gearing at [place] on March 19 at 3:30 p.m. The patient also has been advised to stop smoking and abusing any drugs, to which she agrees for now.  Follow up with me in 1 week.  CONDITION AT DISCHARGE:  Stable.  BRIEF HISTORY AND HOSPITAL COURSE:  [name] is a 65 year old black female with past medical history significant for coronary artery disease, history of non-Q-wave MI in the past secondary to vasospasm secondary to cocaine abuse, hypertension, CHF secondary to systolic dysfunction, COPD, hypercholesterolemia, polysubstance abuse, and mildrenal insufficiency.  She came to the ER via EMS, complaining of left- sided chest pain, localized; without associated nausea, vomiting, or diaphoresis.  Denies any PND, orthopnea, or leg swelling.  Denies palpitation, lightheadedness, or syncope.  Also complains of mild shortness of breath.  Denies any cough or fever or chills.  Denies any  urinary complaints.  The patient denies any chest pain at present.  The patient was noted to have minimally elevated troponin-I and is admitted for further evaluation.  The patient had left cath in the past in November 2010, which showed severe nonischemic dilated cardiomyopathy. States, stopped cocaine recently; last abuse was few months ago.  PAST MEDICAL HISTORY:  As above.  Also, history of schizophrenia.  PAST SURGICAL HISTORY:  She had hysterectomy in the past.  ALLERGIES:  She is allergic to PENICILLIN and  CODEINE.  MEDICATION AT HOME: 1. Lisinopril 10 mg p.o. daily. 2. Lasix 40 mg p.o. b.i.d. 3. Enteric-coated aspirin 81 mg p.o. daily. 4. Plavix 75 mg p.o. daily. 5. Cardizem CD 120 mg p.o. daily. 6. Imdur 30 mg p.o. daily. 7. Prilosec 20 mg p.o. daily. 8. Simvastatin 10 mg p.o. daily. 9. Zyprexa 2.5 mg p.o. daily. 10.Trazodone 50 mg p.o. at nighttime. 11.Albuterol inhaler.  SOCIAL HISTORY:  She is retired, on disability.  Smoked half pack per day for 20+ years, quit recently.  Per patient, smokes cocaine off and on, last use was a few months ago.  Drinks occasionally, socially.  FAMILY HISTORY:  Positive for diabetes mellitus and cancer.  PHYSICAL EXAMINATION:  GENERAL:  She is alert, awake, and oriented x3; in no acute distress. BLOOD PRESSURE:  Blood pressure was 104/64, pulse was 76. HEENT:  Conjunctivae was pink. NECK:  Supple, no JVD. LUNGS:  She has decreased breath sounds at bases. CARDIOVASCULAR:  S1 and S2 were soft.  There was a soft systolic murmur. ABDOMEN:  Soft.  Bowel sounds present.  Nontender. EXTREMITIES:  There was no clubbing, cyanosis, or edema.  LABORATORY DATA:  Urine drug screen was negative for cocaine.  Her hemoglobin was 14.1, hematocrit 42.0, and white count of 4.1.  Point-of- care CK-MB was 2.6.  Troponin-I was minimally elevated at 0.18.  Repeat troponin-I was 0.14, although her CK remained normal.  CK of 104, MB 4.1.  Next set CK was 99, MB 3.8, troponin-I was 0.16.  BNP was slightly elevated to 150.  Her cholesterol was 190, HDL of 110, LDL 73, and triglycerides were 35.  EKG showed left bundle-branch block.  HOSPITAL COURSE:  The patient was admitted to telemetry unit.  MI was ruled out by serial enzymes and EKG.  The patient subsequently underwent Persantine Myoview on 25th, which showed no evidence of myocardial ischemia, with global hypokinesia with EF of 17%.  During hospital stay, patient had acute episode of shortness of breath.   Critical care team consultation was obtained by Dr. Marchelle Gearing.  The patient was started on IV steroids with improvement in her breathing and was also started on p.o. doxycycline.  Her steroid dose has been weaned off.  The patient presently is tolerating nonselective beta blockers, Coreg, and ACE inhibitors.  Due to her severely depressed LV function, we will continue with her beta blockers and ACE inhibitors for now.  If the patient has issues with significant wheezing or breathing issues due to lung problem, we will consider switching to selective beta blockers.  The patient will be discharged home on the above medications and will be followed up in my office in 1 week.  We will discuss with patient, if she stops abusing cocaine, regarding ICD which she has refused in the past.     Eduardo Osier. Sharyn Lull, M.D.     [facility]/MEDQ  D:  04/23/2010  T:  04/24/2010  Job:  [id number]  Electronically Signed by Rinaldo Cloud M.D.  on 05/27/2010 10:25:12 PM

## 2010-05-28 LAB — POCT CARDIAC MARKERS
CKMB, poc: 4.2 ng/mL (ref 1.0–8.0)
CKMB, poc: 4.9 ng/mL (ref 1.0–8.0)
Myoglobin, poc: 291 ng/mL (ref 12–200)

## 2010-05-28 LAB — DIFFERENTIAL
Basophils Absolute: 0 10*3/uL (ref 0.0–0.1)
Basophils Absolute: 0 10*3/uL (ref 0.0–0.1)
Basophils Relative: 1 % (ref 0–1)
Eosinophils Absolute: 0.1 10*3/uL (ref 0.0–0.7)
Lymphocytes Relative: 22 % (ref 12–46)
Lymphocytes Relative: 3 % — ABNORMAL LOW (ref 12–46)
Monocytes Absolute: 0.4 10*3/uL (ref 0.1–1.0)
Neutro Abs: 2.5 10*3/uL (ref 1.7–7.7)
Neutrophils Relative %: 59 % (ref 43–77)
Neutrophils Relative %: 93 % — ABNORMAL HIGH (ref 43–77)

## 2010-05-28 LAB — BASIC METABOLIC PANEL
BUN: 36 mg/dL — ABNORMAL HIGH (ref 6–23)
BUN: 27 mg/dL — ABNORMAL HIGH (ref 6–23)
BUN: 34 mg/dL — ABNORMAL HIGH (ref 6–23)
CO2: 25 mEq/L (ref 19–32)
CO2: 27 mEq/L (ref 19–32)
CO2: 29 mEq/L (ref 19–32)
Calcium: 8.7 mg/dL (ref 8.4–10.5)
Calcium: 8.9 mg/dL (ref 8.4–10.5)
Calcium: 9 mg/dL (ref 8.4–10.5)
Calcium: 9.2 mg/dL (ref 8.4–10.5)
Chloride: 101 mEq/L (ref 96–112)
Chloride: 104 mEq/L (ref 96–112)
Chloride: 98 mEq/L (ref 96–112)
Creatinine, Ser: 1.32 mg/dL — ABNORMAL HIGH (ref 0.4–1.2)
Creatinine, Ser: 1.32 mg/dL — ABNORMAL HIGH (ref 0.4–1.2)
Creatinine, Ser: 1.34 mg/dL — ABNORMAL HIGH (ref 0.4–1.2)
Creatinine, Ser: 1.49 mg/dL — ABNORMAL HIGH (ref 0.4–1.2)
GFR calc Af Amer: 29 mL/min — ABNORMAL LOW (ref >60)
GFR calc Af Amer: 43 mL/min — ABNORMAL LOW (ref >60)
GFR calc Af Amer: 48 mL/min — ABNORMAL LOW (ref >60)
GFR calc Af Amer: 48 mL/min — ABNORMAL LOW (ref >60)
GFR calc non Af Amer: 35 mL/min — ABNORMAL LOW (ref >60)
GFR calc non Af Amer: 40 mL/min — ABNORMAL LOW (ref >60)
GFR calc non Af Amer: 41 mL/min — ABNORMAL LOW (ref >60)
Glucose, Bld: 114 mg/dL — ABNORMAL HIGH (ref 70–99)
Glucose, Bld: 131 mg/dL — ABNORMAL HIGH (ref 70–99)
Glucose, Bld: 162 mg/dL — ABNORMAL HIGH (ref 70–99)
Potassium: 3.5 mEq/L (ref 3.5–5.1)
Sodium: 137 mEq/L (ref 135–145)
Sodium: 137 mEq/L (ref 135–145)
Sodium: 138 mEq/L (ref 135–145)

## 2010-05-28 LAB — BLOOD GAS, ARTERIAL
Bicarbonate: 23.5 mEq/L (ref 20.0–24.0)
O2 Saturation: 89.8 %
Patient temperature: 98.6

## 2010-05-28 LAB — BRAIN NATRIURETIC PEPTIDE
Pro B Natriuretic peptide (BNP): 67 pg/mL (ref 0.0–100.0)
Pro B Natriuretic peptide (BNP): 187 pg/mL — ABNORMAL HIGH (ref 0.0–100.0)

## 2010-05-28 LAB — URINALYSIS, ROUTINE W REFLEX MICROSCOPIC
Hgb urine dipstick: NEGATIVE
Specific Gravity, Urine: 1.021 (ref 1.005–1.030)
Urobilinogen, UA: 0.2 mg/dL (ref 0.0–1.0)
pH: 5.5 (ref 5.0–8.0)

## 2010-05-28 LAB — GLUCOSE, CAPILLARY
Glucose-Capillary: 104 mg/dL — ABNORMAL HIGH (ref 70–99)
Glucose-Capillary: 106 mg/dL — ABNORMAL HIGH (ref 70–99)
Glucose-Capillary: 109 mg/dL — ABNORMAL HIGH (ref 70–99)
Glucose-Capillary: 115 mg/dL — ABNORMAL HIGH (ref 70–99)
Glucose-Capillary: 118 mg/dL — ABNORMAL HIGH (ref 70–99)
Glucose-Capillary: 120 mg/dL — ABNORMAL HIGH (ref 70–99)
Glucose-Capillary: 145 mg/dL — ABNORMAL HIGH (ref 70–99)
Glucose-Capillary: 152 mg/dL — ABNORMAL HIGH (ref 70–99)
Glucose-Capillary: 158 mg/dL — ABNORMAL HIGH (ref 70–99)
Glucose-Capillary: 175 mg/dL — ABNORMAL HIGH (ref 70–99)
Glucose-Capillary: 241 mg/dL — ABNORMAL HIGH (ref 70–99)
Glucose-Capillary: 73 mg/dL (ref 70–99)
Glucose-Capillary: 81 mg/dL (ref 70–99)
Glucose-Capillary: 91 mg/dL (ref 70–99)

## 2010-05-28 LAB — COMPREHENSIVE METABOLIC PANEL
Albumin: 3.7 g/dL (ref 3.5–5.2)
Alkaline Phosphatase: 71 U/L (ref 39–117)
BUN: 24 mg/dL — ABNORMAL HIGH (ref 6–23)
Chloride: 105 mEq/L (ref 96–112)
Glucose, Bld: 202 mg/dL — ABNORMAL HIGH (ref 70–99)
Potassium: 3.5 mEq/L (ref 3.5–5.1)
Total Bilirubin: 0.4 mg/dL (ref 0.3–1.2)

## 2010-05-28 LAB — D-DIMER, QUANTITATIVE: D-Dimer, Quant: 1.17 ug/mL-FEU — ABNORMAL HIGH (ref 0.00–0.48)

## 2010-05-28 LAB — CARDIAC PANEL(CRET KIN+CKTOT+MB+TROPI)
CK, MB: 8.7 ng/mL — ABNORMAL HIGH (ref 0.3–4.0)
Relative Index: 1.1 (ref 0.0–2.5)
Relative Index: 1.2 (ref 0.0–2.5)
Total CK: 787 U/L — ABNORMAL HIGH (ref 7–177)
Troponin I: 0.31 ng/mL — ABNORMAL HIGH (ref 0.00–0.06)

## 2010-05-28 LAB — CBC
HCT: 38.1 % (ref 36.0–46.0)
Hemoglobin: 12.6 g/dL (ref 12.0–15.0)
MCHC: 33.4 g/dL (ref 30.0–36.0)
MCHC: 33.1 g/dL (ref 30.0–36.0)
Platelets: 168 10*3/uL (ref 150–400)
Platelets: 161 10*3/uL (ref 150–400)
RBC: 3.94 MIL/uL (ref 3.87–5.11)
RDW: 14 % (ref 11.5–15.5)
RDW: 13.9 % (ref 11.5–15.5)
WBC: 5.1 10*3/uL (ref 4.0–10.5)

## 2010-05-28 LAB — URINE MICROSCOPIC-ADD ON

## 2010-05-30 LAB — BRAIN NATRIURETIC PEPTIDE: Pro B Natriuretic peptide (BNP): 149 pg/mL — ABNORMAL HIGH (ref 0.0–100.0)

## 2010-05-30 LAB — URINALYSIS, ROUTINE W REFLEX MICROSCOPIC
Bilirubin Urine: NEGATIVE
Glucose, UA: NEGATIVE mg/dL
Hgb urine dipstick: NEGATIVE
Protein, ur: 30 mg/dL — AB
Urobilinogen, UA: 1 mg/dL (ref 0.0–1.0)

## 2010-05-30 LAB — DIFFERENTIAL
Eosinophils Relative: 5 % (ref 0–5)
Lymphocytes Relative: 19 % (ref 12–46)
Lymphs Abs: 0.8 10*3/uL (ref 0.7–4.0)
Monocytes Absolute: 0.5 10*3/uL (ref 0.1–1.0)

## 2010-05-30 LAB — RAPID URINE DRUG SCREEN, HOSP PERFORMED
Amphetamines: NOT DETECTED
Benzodiazepines: NOT DETECTED
Cocaine: POSITIVE — AB

## 2010-05-30 LAB — CBC
HCT: 42.1 % (ref 36.0–46.0)
Hemoglobin: 14.1 g/dL (ref 12.0–15.0)
RDW: 14.4 % (ref 11.5–15.5)
WBC: 4.2 10*3/uL (ref 4.0–10.5)

## 2010-05-30 LAB — URINE MICROSCOPIC-ADD ON

## 2010-05-30 LAB — POCT I-STAT, CHEM 8
BUN: 23 mg/dL (ref 6–23)
Calcium, Ion: 1.14 mmol/L (ref 1.12–1.32)
Creatinine, Ser: 1.6 mg/dL — ABNORMAL HIGH (ref 0.4–1.2)
TCO2: 26 mmol/L (ref 0–100)

## 2010-05-31 NOTE — Discharge Summary (Signed)
NAME:  [name], [name] NO.:  [id number]  MEDICAL RECORD NO.:  [id number]          PATIENT TYPE:  INP  LOCATION:                               FACILITY:  [facility]  PHYSICIAN:  Jeoffrey Massed, MD    DATE OF BIRTH:  March 07, 1945  DATE OF ADMISSION: DATE OF DISCHARGE:                        DISCHARGE SUMMARY - REFERRING   PRIMARY CARE PRACTITIONER/PRIMARY CARDIOLOGIST:  Eduardo Osier. Harwani, MD  PRIMARY DISCHARGE DIAGNOSES: 1. Non-ST-segment elevation myocardial infarction. 2. Acute chronic obstructive pulmonary disease exacerbation,     significantly better.  SECONDARY DISCHARGE DIAGNOSES: 1. History of dilated cardiomyopathy with an ejection fraction of     around 15-20%. 2. History of left bundle-branch block. 3. History of prior coronary artery disease and prior non-ST-segment     elevation myocardial infarction. 4. History of prior posterior cerebellar artery infarction. 5. History of cocaine abuse. 6. Hypertension. 7. History of chronic obstructive pulmonary disease. 8. Dyslipidemia. 9. History of chronic kidney disease, stage III. 10.History of hysterectomy. 11.History of breast cancer.  DISCHARGE MEDICATIONS: 1. Avelox 400 mg 1 tablet p.o. daily. 2. Prednisone 40 mg p.o. daily from May 24, 2010, to May 27, 2010. 3. Prednisone 30 mg p.o. daily from May 28, 2010, to May 30, 2010. 4. Prednisone 20 mg p.o. daily from May 31, 2010, to June 02, 2010. 5. Prednisone 10 mg p.o. daily from June 03, 2010, to June 05, 2010,     and then discontinue. 6. Albuterol nebulizer 2.5 mg 1 vial inhaled every 6 hours p.r.n. 7. Ambien 10 mg 1 tablet p.o. nightly. 8. Aspirin 81 mg 1 tablet p.o. daily. 9. Coreg 3.125 mg 1 tablet p.o. twice daily. 10.Colace 100 mg 1 tablet p.o. twice daily. 11.Lasix 40 mg 1 tablet p.o. daily. 12.Imdur 30 mg 1 tablet p.o. daily. 13.Lisinopril 10 mg 1 tablet p.o. daily. 14.Nitroglycerin 0.4 mg 1 tablet under the tongue every 5 minutes  up     to 3 doses p.r.n. 15.Omeprazole 20 mg 1 capsule p.o. every morning. 16.Plavix 75 mg 1 tablet p.o. daily. 17.Proventil 2 puffs inhaled q.6 h. p.r.n. 18.Seroquel 50 mg 1 tablet p.o. daily at bedtime. 19.Simvastatin 10 mg 1 tablet p.o. nightly. 20.Spiriva 18 mcg 1 capsule inhaled daily. 21.Symbicort 80/4.5 two puffs inhaled q.12 h. 22.Trazodone 50 mg 1 tablet p.o. nightly p.r.n. 23.Zyprexa 2.5 mg 1 tablet p.o. daily.  CONSULTATIONS:  Dr. Algie Coffer and Dr. Sharyn Lull from Cardiology.  BRIEF HISTORY OF PRESENT ILLNESS:  The patient is a 65 year old female with a history of COPD, history of cardiomyopathy with an EF around 20- 30%, history of left bundle-branch block with a prior history of cocaine abuse who presented to the ED on May 21, 2010, with shortness of breath.  For further details, please see the history and physical that was dictated by Dr. Pearson Grippe.  PERTINENT LABORATORY DATA: 1. D-dimer on admission was 1.02. 2. Peak troponin was 0.25. 3. Discharge chemistry shows a creatinine of 1.47. 4. Discharge CBC shows a WBC of 10.0, hemoglobin of 11.8, and a     platelet count of 177.  PERTINENT RADIOLOGICAL STUDIES: 1. V/Q scan done on May 21, 2010, shows low probability for PE. 2. A chest x-ray portable shows enlargement of the cardiac silhouette,     COPD, with no acute abnormalities.  BRIEF HOSPITAL COURSE: 1. Shortness of breath:  This was felt secondary to be due to acute     exacerbation of COPD.  The patient was admitted and given steroids,     nebulizers, and antibiotics.  With this, she has made significant     clinical improvements and today on the day of discharge she claims     she is at baseline.  Her lungs are clear on exam.  She will be     discharged on tapering steroids as noted above, along with the     usual other nebulizers and inhalers. 2. Non-STEMI:  This is probably secondary to vasospasm secondary to     cocaine use.  She was consulted by  Cardiology and they did     recommend continued medical management as she was evaluated by Dr.     Algie Coffer today and he has cleared the patient for discharge. 3. Dilated cardiomyopathy:  She is stable and compensated in terms of     her CHF.  She is maintained on her Lasix and lisinopril.  I did     discuss with Dr. Algie Coffer today and he suggested that labetalol can     be discontinued and she could continue her usual Coreg as before. 4. History of coronary artery disease:  She is to continue her     antiplatelet regimen. 5. History of prior polysubstance abuse:  Her urinary drug screen done     this admission was again positive for cocaine and opiates.  She has     been counseled extensively by me about the importance of stopping     these illicit substances.  She claims understanding.  Again, per my     discussion with Dr. Algie Coffer, he said it is okay to use Coreg and we     will continue her usual dosing on discharge. 6. Chronic kidney disease, stage III:  Her creatinine is very close to     baseline and she will follow up with her regular physicians for     continued monitoring.  DISPOSITION:  It is felt that the patient is stable for discharge and she has been cleared by Cardiology as well.  FOLLOWUP INSTRUCTIONS: 1. Refrain from cocaine use. 2. Follow up with Dr. Sharyn Lull within 1 week.  She is to call and make     an appointment.  She claims understanding.  Total time spent coordinating discharge is 45 minutes.     Jeoffrey Massed, MD     SG/MEDQ  D:  05/23/2010  T:  05/23/2010  Job:  [id number]  cc:   Eduardo Osier. Sharyn Lull, M.D.  Electronically Signed by Jeoffrey Massed  on 05/31/2010 08:25:19 PM

## 2010-06-03 NOTE — H&P (Signed)
NAME:  [name], [name] NO.:  [id number]  MEDICAL RECORD NO.:  [id number]           PATIENT TYPE:  E  LOCATION:  [facility]                         FACILITY:  [facility]  PHYSICIAN:  Massie Maroon, MD        DATE OF BIRTH:  06/09/1945  DATE OF ADMISSION:  05/21/2010 DATE OF DISCHARGE:                             HISTORY & PHYSICAL   CHIEF COMPLAINT:  Shortness of breath.  HISTORY OF PRESENT ILLNESS:  This is a 65 year old female with a history of COPD, CHF, cardiomyopathy, CAD, left bundle branch block, apparently presents with complaints of shortness of breath starting today.  She notes some dry cough, diaphoresis, and some nausea but denies any chest pain, palpitations, diarrhea, bright red blood per rectum, or black stool.  The patient came to the ER for evaluation.  Chest x-ray was negative for any acute process.  The patient was diffusely wheezy in bilateral lung fields and tight.  She was treated with apparently Solu- Medrol and nebulizer treatments in the ED with some relief.  She was initiated on BiPAP and this was later withdrawn as her breathing improved.  The patient will be admitted for COPD exacerbation and non-ST- elevation MI.  [name] EKG showed initially wide complex tachycardia at 145.  After breathing improved repeat EKG showed sinus tach at 95, left axis deviation, ST elevation in V1, V2, V3, V4, V5, and V6.  Cardiac markers were slightly positive.  The patient will be admitted for COPD exacerbation.  PAST MEDICAL HISTORY: 1. COPD. 2. Hypertension. 3. Cocaine abuse. 4. Left posterior cerebellar artery infarction, likely embolic. 5. Non-ST-elevation MI secondary to cocaine use. 6. CAD. 7. Left bundle branch block. 8. Dilated cardiomyopathy (EF 15-20%). 9. Permanent pacemaker in the past status post removal. 10.Hyperlipidemia. 11.History of breast cancer. 12.History of chronic kidney disease. 13.History of grand mal seizures as a  child. 14.Hysterectomy.  SOCIAL HISTORY:  The patient is a former smoker.  She continues to abuse cocaine.  She occasionally drink.  She lives with her daughter.  FAMILY HISTORY:  Her brother passed away from hypertensive heart disease.  She reported that there is no history of coronary disease in the family.  Her mother had a history of breast cancer and her father had a history of hypertension.  ALLERGIES: 1. CODEINE. 2. PENICILLIN.  MEDICATIONS: 1. Plavix 75 mg p.o. daily. 2. Enteric-coated aspirin 81 mg p.o. daily. 3. Simvastatin 10 mg p.o. at bedtime. 4. Sublingual nitroglycerin 0.4 mg p.o. q.5 minutes p.r.n. chest pain. 5. Imdur 30 mg p.o. daily. 6. Furosemide 40 mg p.o. daily. 7. Carvedilol 3.125 mg p.o. b.i.d. 8. Lisinopril 10 mg p.o. daily. 9. Colace 100 mg p.o. b.i.d. p.r.n. constipation. 10.Ambien 5 mg p.o. at bedtime p.r.n. insomnia. 11.Trazodone 50 mg p.o. at bedtime p.r.n. insomnia. 12.Zyprexa 2.5 mg p.o. daily. 13.Seroquel 50 mg p.o. 1 at bedtime. 14.ProAir HFA 2 puffs q.6 h. p.r.n.  REVIEW OF SYSTEMS:  Negative for all 10-organ systems except for pertinent positives stated above.  PHYSICAL EXAMINATION:  VITAL SIGNS:  Temperature afebrile, pulse 148, blood pressure 199/123, and pulse ox 100% room air.  HEENT: Anicteric. NECK:  No JVD, no bruit. HEART:  Tachycardic.  S1-S2. LUNGS:  Tight prolonged expiratory phase.  No crackles, occasional wheeze. ABDOMEN:  Soft, nontender, and nondistended.  Positive bowel sounds. EXTREMITIES:  No cyanosis, clubbing, or edema. SKIN:  No rashes. LYMPH NODES:  No adenopathy. NEUROLOGIC:  Nonfocal.  LABORATORY DATA:  WBC 6.0, hemoglobin 14.0, and platelet count 193. Troponin I 0.21, CK 200, CK-MB 5.7, and relative index 2.9.  Sodium 142, potassium 3.9, BUN 14, creatinine 1.50, and glucose 254.  Chest x-ray:  COPD, no acute abnormalities.  EKG:  Normal sinus rhythm at 95, left axis deviation, left  bundle-branch block.  ASSESSMENT AND PLAN: 1. Shortness of breath likely secondary to chronic obstructive     pulmonary disease exacerbation:  The patient will be treated with     Solu-Medrol IV, Avelox IV, and Spiriva 1 puff daily. 2. Tachycardia:  We will check CK, CK-MB, troponin I q.6 h. x3 sets.     Check cardiac 2-D echo and continue on home blood pressure     medications. 3. Coronary artery disease status post myocardial infarction secondary     to cocaine abuse:  Continue present blood pressure medications. 4. Deep venous thrombosis prophylaxis:  Sequential compression     devices.     Massie Maroon, MD     JYK/MEDQ  D:  05/21/2010  T:  05/21/2010  Job:  [id number]  cc:   Eduardo Osier. Sharyn Lull, M.D. [facility]  Electronically Signed by Pearson Grippe MD on 06/03/2010 02:04:02 AM

## 2010-06-04 ENCOUNTER — Encounter: Payer: Self-pay | Admitting: Internal Medicine

## 2010-06-06 ENCOUNTER — Emergency Department (HOSPITAL_COMMUNITY)
Admission: EM | Admit: 2010-06-06 | Discharge: 2010-06-06 | Disposition: A | Discharge status: Home / Self Care | Payer: Medicare Other | Attending: Emergency Medicine | Admitting: Emergency Medicine

## 2010-06-06 ENCOUNTER — Emergency Department (HOSPITAL_COMMUNITY): Payer: Medicare Other

## 2010-06-06 DIAGNOSIS — I10 Essential (primary) hypertension: Secondary | ICD-10-CM | POA: Insufficient documentation

## 2010-06-06 DIAGNOSIS — Z853 Personal history of malignant neoplasm of breast: Secondary | ICD-10-CM | POA: Insufficient documentation

## 2010-06-06 DIAGNOSIS — Z8673 Personal history of transient ischemic attack (TIA), and cerebral infarction without residual deficits: Secondary | ICD-10-CM | POA: Insufficient documentation

## 2010-06-06 DIAGNOSIS — R51 Headache: Secondary | ICD-10-CM | POA: Insufficient documentation

## 2010-06-06 DIAGNOSIS — I6789 Other cerebrovascular disease: Secondary | ICD-10-CM | POA: Insufficient documentation

## 2010-06-06 DIAGNOSIS — N289 Disorder of kidney and ureter, unspecified: Secondary | ICD-10-CM | POA: Insufficient documentation

## 2010-06-06 LAB — PROTIME-INR: Prothrombin Time: 13.2 seconds (ref 11.6–15.2)

## 2010-06-06 LAB — URINALYSIS, ROUTINE W REFLEX MICROSCOPIC
Bilirubin Urine: NEGATIVE
Nitrite: NEGATIVE
Specific Gravity, Urine: 1.018 (ref 1.005–1.030)
Urobilinogen, UA: 0.2 mg/dL (ref 0.0–1.0)

## 2010-06-06 LAB — BASIC METABOLIC PANEL
CO2: 30 mEq/L (ref 19–32)
Calcium: 9.7 mg/dL (ref 8.4–10.5)
Chloride: 104 mEq/L (ref 96–112)
Glucose, Bld: 97 mg/dL (ref 70–99)
Potassium: 4 mEq/L (ref 3.5–5.1)
Sodium: 141 mEq/L (ref 135–145)

## 2010-06-06 LAB — DIFFERENTIAL
Basophils Relative: 0 % (ref 0–1)
Monocytes Absolute: 0.5 10*3/uL (ref 0.1–1.0)
Monocytes Relative: 8 % (ref 3–12)
Neutro Abs: 4.7 10*3/uL (ref 1.7–7.7)

## 2010-06-06 LAB — URINE MICROSCOPIC-ADD ON

## 2010-06-06 LAB — CBC
Hemoglobin: 15.1 g/dL — ABNORMAL HIGH (ref 12.0–15.0)
MCH: 30.9 pg (ref 26.0–34.0)
MCHC: 33.6 g/dL (ref 30.0–36.0)

## 2010-06-06 LAB — RAPID URINE DRUG SCREEN, HOSP PERFORMED
Amphetamines: NOT DETECTED
Tetrahydrocannabinol: NOT DETECTED

## 2010-06-08 ENCOUNTER — Inpatient Hospital Stay: Payer: Medicare Other | Admitting: Internal Medicine

## 2010-06-18 ENCOUNTER — Emergency Department (HOSPITAL_COMMUNITY): Payer: Medicare Other

## 2010-06-18 ENCOUNTER — Inpatient Hospital Stay (HOSPITAL_COMMUNITY)
Admission: EM | Admit: 2010-06-18 | Discharge: 2010-06-22 | DRG: 062 | Disposition: A | Discharge status: Rehabilitation Facility | Payer: Medicare Other | Attending: Neurology | Admitting: Neurology

## 2010-06-18 DIAGNOSIS — I429 Cardiomyopathy, unspecified: Secondary | ICD-10-CM | POA: Diagnosis present

## 2010-06-18 DIAGNOSIS — R29898 Other symptoms and signs involving the musculoskeletal system: Secondary | ICD-10-CM | POA: Diagnosis present

## 2010-06-18 DIAGNOSIS — I5022 Chronic systolic (congestive) heart failure: Secondary | ICD-10-CM | POA: Diagnosis present

## 2010-06-18 DIAGNOSIS — I69998 Other sequelae following unspecified cerebrovascular disease: Secondary | ICD-10-CM

## 2010-06-18 DIAGNOSIS — Z7982 Long term (current) use of aspirin: Secondary | ICD-10-CM

## 2010-06-18 DIAGNOSIS — I252 Old myocardial infarction: Secondary | ICD-10-CM

## 2010-06-18 DIAGNOSIS — Z79899 Other long term (current) drug therapy: Secondary | ICD-10-CM

## 2010-06-18 DIAGNOSIS — Z853 Personal history of malignant neoplasm of breast: Secondary | ICD-10-CM

## 2010-06-18 DIAGNOSIS — F17200 Nicotine dependence, unspecified, uncomplicated: Secondary | ICD-10-CM | POA: Diagnosis present

## 2010-06-18 DIAGNOSIS — F29 Unspecified psychosis not due to a substance or known physiological condition: Secondary | ICD-10-CM | POA: Diagnosis present

## 2010-06-18 DIAGNOSIS — I672 Cerebral atherosclerosis: Secondary | ICD-10-CM | POA: Diagnosis present

## 2010-06-18 DIAGNOSIS — J4489 Other specified chronic obstructive pulmonary disease: Secondary | ICD-10-CM | POA: Diagnosis present

## 2010-06-18 DIAGNOSIS — F1410 Cocaine abuse, uncomplicated: Secondary | ICD-10-CM | POA: Diagnosis present

## 2010-06-18 DIAGNOSIS — I6350 Cerebral infarction due to unspecified occlusion or stenosis of unspecified cerebral artery: Principal | ICD-10-CM | POA: Diagnosis present

## 2010-06-18 DIAGNOSIS — R4701 Aphasia: Secondary | ICD-10-CM | POA: Diagnosis present

## 2010-06-18 DIAGNOSIS — N183 Chronic kidney disease, stage 3 unspecified: Secondary | ICD-10-CM | POA: Diagnosis present

## 2010-06-18 DIAGNOSIS — I129 Hypertensive chronic kidney disease with stage 1 through stage 4 chronic kidney disease, or unspecified chronic kidney disease: Secondary | ICD-10-CM | POA: Diagnosis present

## 2010-06-18 DIAGNOSIS — J449 Chronic obstructive pulmonary disease, unspecified: Secondary | ICD-10-CM | POA: Diagnosis present

## 2010-06-18 DIAGNOSIS — I509 Heart failure, unspecified: Secondary | ICD-10-CM | POA: Diagnosis present

## 2010-06-18 LAB — POCT I-STAT, CHEM 8
BUN: 16 mg/dL (ref 6–23)
Chloride: 109 mEq/L (ref 96–112)
Creatinine, Ser: 1.4 mg/dL — ABNORMAL HIGH (ref 0.4–1.2)
Glucose, Bld: 104 mg/dL — ABNORMAL HIGH (ref 70–99)
Potassium: 3.5 mEq/L (ref 3.5–5.1)

## 2010-06-18 LAB — COMPREHENSIVE METABOLIC PANEL
ALT: 10 U/L (ref 0–35)
AST: 35 U/L (ref 0–37)
Albumin: 4 g/dL (ref 3.5–5.2)
BUN: 14 mg/dL (ref 6–23)
BUN: 12 mg/dL (ref 6–23)
CO2: 22 mEq/L (ref 19–32)
Calcium: 9.4 mg/dL (ref 8.4–10.5)
Creatinine, Ser: 1.24 mg/dL — ABNORMAL HIGH (ref 0.4–1.2)
Creatinine, Ser: 1.06 mg/dL (ref 0.4–1.2)
GFR calc Af Amer: >60 mL/min (ref >60)
GFR calc non Af Amer: 43 mL/min — ABNORMAL LOW (ref >60)
Glucose, Bld: 104 mg/dL — ABNORMAL HIGH (ref 70–99)
Total Protein: 6.9 g/dL (ref 6.0–8.3)
Total Protein: 7.1 g/dL (ref 6.0–8.3)

## 2010-06-18 LAB — CBC
HCT: 42.1 % (ref 36.0–46.0)
HCT: 42.9 % (ref 36.0–46.0)
Hemoglobin: 14.1 g/dL (ref 12.0–15.0)
MCHC: 33.5 g/dL (ref 30.0–36.0)
MCV: 92.3 fL (ref 78.0–100.0)
Platelets: 213 10*3/uL (ref 150–400)
RBC: 4.65 MIL/uL (ref 3.87–5.11)
RDW: 14 % (ref 11.5–15.5)
RDW: 14 % (ref 11.5–15.5)
WBC: 4.6 10*3/uL (ref 4.0–10.5)

## 2010-06-18 LAB — URINALYSIS, ROUTINE W REFLEX MICROSCOPIC
Bilirubin Urine: NEGATIVE
Ketones, ur: NEGATIVE mg/dL
Nitrite: NEGATIVE
Protein, ur: NEGATIVE mg/dL
pH: 5.5 (ref 5.0–8.0)

## 2010-06-18 LAB — PROTIME-INR
INR: 1.09 (ref 0.00–1.49)
INR: 1.51 — ABNORMAL HIGH (ref 0.00–1.49)
Prothrombin Time: 14.3 seconds (ref 11.6–15.2)

## 2010-06-18 LAB — MRSA PCR SCREENING: MRSA by PCR: NEGATIVE

## 2010-06-18 LAB — GLUCOSE, CAPILLARY: Glucose-Capillary: 97 mg/dL (ref 70–99)

## 2010-06-18 LAB — APTT
aPTT: 31 seconds (ref 24–37)
aPTT: 47 seconds — ABNORMAL HIGH (ref 24–37)

## 2010-06-18 LAB — CK TOTAL AND CKMB (NOT AT ARMC)
CK, MB: 5.7 ng/mL — ABNORMAL HIGH (ref 0.3–4.0)
Relative Index: 5.5 — ABNORMAL HIGH (ref 0.0–2.5)
Total CK: 104 U/L (ref 7–177)

## 2010-06-18 LAB — DIFFERENTIAL
Eosinophils Relative: 4 % (ref 0–5)
Lymphocytes Relative: 44 % (ref 12–46)
Lymphs Abs: 1.3 10*3/uL (ref 0.7–4.0)
Monocytes Absolute: 0.4 10*3/uL (ref 0.1–1.0)
Monocytes Relative: 15 % — ABNORMAL HIGH (ref 3–12)

## 2010-06-18 LAB — CARDIAC PANEL(CRET KIN+CKTOT+MB+TROPI)
CK, MB: 6.9 ng/mL (ref 0.3–4.0)
Relative Index: 4.9 — ABNORMAL HIGH (ref 0.0–2.5)
Total CK: 140 U/L (ref 7–177)
Troponin I: 1.41 ng/mL (ref 0.00–0.06)

## 2010-06-18 LAB — TROPONIN I: Troponin I: 1.34 ng/mL (ref 0.00–0.06)

## 2010-06-18 LAB — RAPID URINE DRUG SCREEN, HOSP PERFORMED
Cocaine: NOT DETECTED
Tetrahydrocannabinol: NOT DETECTED

## 2010-06-19 ENCOUNTER — Other Ambulatory Visit (HOSPITAL_COMMUNITY): Payer: Medicare Other

## 2010-06-19 ENCOUNTER — Inpatient Hospital Stay (HOSPITAL_COMMUNITY): Payer: Medicare Other

## 2010-06-19 LAB — URINE CULTURE
Colony Count: NO GROWTH
Culture  Setup Time: 201204271253
Culture: NO GROWTH

## 2010-06-19 LAB — LIPID PANEL
Cholesterol: 225 mg/dL — ABNORMAL HIGH (ref 0–200)
HDL: 99 mg/dL (ref >39)
LDL Cholesterol: 113 mg/dL — ABNORMAL HIGH (ref 0–99)
Total CHOL/HDL Ratio: 2.3 RATIO
Triglycerides: 67 mg/dL (ref <150)

## 2010-06-19 LAB — HOMOCYSTEINE: Homocysteine: 10 umol/L (ref 4.0–15.4)

## 2010-06-19 LAB — CARDIAC PANEL(CRET KIN+CKTOT+MB+TROPI)
CK, MB: 9.1 ng/mL (ref 0.3–4.0)
Total CK: 635 U/L — ABNORMAL HIGH (ref 7–177)

## 2010-06-19 NOTE — H&P (Signed)
NAME:  [name], [name] NO.:  [id number]  MEDICAL RECORD NO.:  [id number]           PATIENT TYPE:  I  LOCATION:  3101                         FACILITY:  [facility]  PHYSICIAN:  Thana Farr, MD    DATE OF BIRTH:  1945/03/28  DATE OF ADMISSION:  06/18/2010 DATE OF DISCHARGE:                             HISTORY & PHYSICAL   ADMISSION DIAGNOSIS:  Acute left hemispheric infarct.  HISTORY:  [name] is a 65 year old female with multiple medical problems who awakened this morning normal.  She lives with [name]. [name] noted at 10:30 that she was having difficulty using Bailey right upper extremity.  The patient then started to drool from Bailey mouth and was unable to speak appropriately.  EMS was called at that time. The patient was brought in for evaluation.  Code Stroke was called.  It should be noted that the patient does have a chronic limb with weakness in the left lower extremity that is per family was secondary to an old stroke.  PAST MEDICAL HISTORY: 1. CVAs in the past. 2. Status post hysterectomy. 3. Breast CA. 4. Dilated cardiomyopathy with a documented EF of 15-20%. 5. Hypertension. 6. COPD. 7. Psychiatric disease. 8. Stage III chronic kidney disease.  MEDICATIONS:  Symbicort, trazodone, Zyprexa, albuterol nebs, Ambien, aspirin, Coreg, Colace, Lasix, Imdur, lisinopril, nitroglycerin, omeprazole, Plavix, Proventil, Seroquel, simvastatin, and Spiriva.  ALLERGIES:  PENICILLIN, CODEINE, HYDRALAZINE.  FAMILY HISTORY:  Significant for a brother that has hypertension and heart disease.  Bailey mother had breast cancer.  Father with hypertension.  SOCIAL HISTORY:  The patient lives with [name].  She is a retired Ambulance person.  She does not smoke.  She drinks and has drank for many years.  Refuses to discontinue alcohol use.  The patient had a history of cocaine abuse as well.  Last documented positive screen was on June 03, 2010.  The  family cannot say for sure whether she has used any recently.  PHYSICAL EXAMINATION:  VITAL SIGNS:  Blood pressure 149/103, heart rate 72, respiratory rate 19, O2 sat 97% on room air. ON MENTAL STATUS TESTING:  The patient is awake and alert.  She has an expressive aphasia.  At times she is able to get understandable words out.  The patient is dysarthric as well.  She is able to follow commands. ON CRANIAL NERVE TESTING:  II, disk flat bilaterally.  There is decrease in the right visual field.  III, IV, and VI, extraocular movements intact.  Pupils reactive bilaterally.  V and VII, right facial droop. VIII, grossly intact.  XI and X, decreased gag.  XI, bilateral shoulder shrug.  XII, midline tongue extension. ON MOTOR EXAM:  The patient is 4 to -5/5 strength in the right upper extremity with 2/5 hand grip on the right.  The left upper extremity, the patient is 5/5.  The right lower extremity, the patient to 5-/5, but seems to have decrease spontaneous movement of the right lower extremity.  She is 4/5 in the left lower extremity. ON SENSORY TESTING:  The patient was intact to pinprick and light touch bilaterally.  Deep  tendon reflexes are 3+ bilaterally.  Plantars are upgoing bilaterally. ON CEREBELLAR TESTING:  Finger-to-nose is intact using the left upper extremity. The patient had difficulty secondary to weakness using the right upper extremity.  Heel-to-shin is intact using the right lower extremity.  It is not intact using the left lower extremity.  LABORATORY DATA:  Shows sodium of 141, potassium 3.5, chloride 108, bicarb of 22, BUN and creatinine 14 and 1.24 respectively, glucose 104, hemoglobin/hematocrit 14.1 and 42.1 respectively, white blood cell count 2.9, and platelet count 199,000.  PT, INR, and PTT 14.3, 1.09 and 31 respectively.  EKG shows a left bundle-branch block.  CT shows old ischemic changes in the right frontal lobe and left occipital lobe and left temporal  lobe.  There is an area of hyperdensity of unclear significance per Radiology.  Film was read by [name] as well.  Feels that the area was old and noted on previous imaging.  Did not feel there is any evidence of hemorrhage.  ASSESSMENT:  [name] is a 65 year old female with recurrent ischemic event.  [name] initial NIH stroke scale was 7.  It was decided that the patient would receive TPA.  The patient is not considered interventional candidate due to Bailey previous functional situation.  The patient does have a history of psychiatric illness with psychosis and hallucinations.  Also, with a significant history of alcohol and illicit drug abuse.  The patient was not able to handle all of Bailey affairs previous to admission.  PLAN: 1. Full dose t-PA.  Blood pressure to be controlled prior to t-PA     administration use an labetalol.  Cardizem may need to be used     after admission for blood pressure control 2. Admit at 3100 on post t-PA protocol. 3. Urine drug screen.  The patient is considered critically ill.  She is a high probability of further neurologic decompensation.  Bailey decision-making was required in the management of this patient and 120 minutes was spent in care in management of this patient.  [name] family (Clark and sister were made in conversation).  Treatment and prognosis were discussed. They were in agreement with the treatment plan.          ______________________________ Thana Farr, MD     LR/MEDQ  D:  06/18/2010  T:  06/19/2010  Job:  [id number]  Electronically Signed by Thana Farr MD on 06/19/2010 03:12:27 PM

## 2010-06-20 ENCOUNTER — Inpatient Hospital Stay (HOSPITAL_COMMUNITY): Payer: Medicare Other

## 2010-06-20 LAB — GLUCOSE, CAPILLARY
Glucose-Capillary: 75 mg/dL (ref 70–99)
Glucose-Capillary: 78 mg/dL (ref 70–99)

## 2010-06-21 ENCOUNTER — Inpatient Hospital Stay (HOSPITAL_COMMUNITY): Payer: Medicare Other

## 2010-06-21 DIAGNOSIS — I6330 Cerebral infarction due to thrombosis of unspecified cerebral artery: Secondary | ICD-10-CM

## 2010-06-21 LAB — GLUCOSE, CAPILLARY
Glucose-Capillary: 71 mg/dL (ref 70–99)
Glucose-Capillary: 64 mg/dL — ABNORMAL LOW (ref 70–99)
Glucose-Capillary: 60 mg/dL — ABNORMAL LOW (ref 70–99)
Glucose-Capillary: 88 mg/dL (ref 70–99)

## 2010-06-22 ENCOUNTER — Inpatient Hospital Stay (HOSPITAL_COMMUNITY)
Admission: RE | Admit: 2010-06-22 | Discharge: 2010-07-07 | DRG: 945 | Disposition: A | Discharge status: Home Health | Payer: Medicare Other | Source: Other Acute Inpatient Hospital | Attending: Physical Medicine & Rehabilitation | Admitting: Physical Medicine & Rehabilitation

## 2010-06-22 DIAGNOSIS — E876 Hypokalemia: Secondary | ICD-10-CM

## 2010-06-22 DIAGNOSIS — R29898 Other symptoms and signs involving the musculoskeletal system: Secondary | ICD-10-CM

## 2010-06-22 DIAGNOSIS — J449 Chronic obstructive pulmonary disease, unspecified: Secondary | ICD-10-CM

## 2010-06-22 DIAGNOSIS — I6350 Cerebral infarction due to unspecified occlusion or stenosis of unspecified cerebral artery: Secondary | ICD-10-CM

## 2010-06-22 DIAGNOSIS — J4489 Other specified chronic obstructive pulmonary disease: Secondary | ICD-10-CM

## 2010-06-22 DIAGNOSIS — I10 Essential (primary) hypertension: Secondary | ICD-10-CM

## 2010-06-22 DIAGNOSIS — Z87820 Personal history of traumatic brain injury: Secondary | ICD-10-CM

## 2010-06-22 DIAGNOSIS — E441 Mild protein-calorie malnutrition: Secondary | ICD-10-CM

## 2010-06-22 DIAGNOSIS — R3989 Other symptoms and signs involving the genitourinary system: Secondary | ICD-10-CM

## 2010-06-22 DIAGNOSIS — Z8673 Personal history of transient ischemic attack (TIA), and cerebral infarction without residual deficits: Secondary | ICD-10-CM

## 2010-06-22 DIAGNOSIS — E785 Hyperlipidemia, unspecified: Secondary | ICD-10-CM

## 2010-06-22 DIAGNOSIS — G4733 Obstructive sleep apnea (adult) (pediatric): Secondary | ICD-10-CM

## 2010-06-22 DIAGNOSIS — F4310 Post-traumatic stress disorder, unspecified: Secondary | ICD-10-CM

## 2010-06-22 DIAGNOSIS — Z87891 Personal history of nicotine dependence: Secondary | ICD-10-CM

## 2010-06-22 DIAGNOSIS — I6330 Cerebral infarction due to thrombosis of unspecified cerebral artery: Secondary | ICD-10-CM

## 2010-06-22 DIAGNOSIS — K5900 Constipation, unspecified: Secondary | ICD-10-CM

## 2010-06-22 DIAGNOSIS — Z7982 Long term (current) use of aspirin: Secondary | ICD-10-CM

## 2010-06-22 DIAGNOSIS — F209 Schizophrenia, unspecified: Secondary | ICD-10-CM

## 2010-06-22 DIAGNOSIS — Z853 Personal history of malignant neoplasm of breast: Secondary | ICD-10-CM

## 2010-06-22 DIAGNOSIS — B370 Candidal stomatitis: Secondary | ICD-10-CM

## 2010-06-22 DIAGNOSIS — F1410 Cocaine abuse, uncomplicated: Secondary | ICD-10-CM

## 2010-06-22 DIAGNOSIS — Z681 Body mass index (BMI) 19 or less, adult: Secondary | ICD-10-CM

## 2010-06-22 DIAGNOSIS — T502X5A Adverse effect of carbonic-anhydrase inhibitors, benzothiadiazides and other diuretics, initial encounter: Secondary | ICD-10-CM

## 2010-06-22 DIAGNOSIS — I214 Non-ST elevation (NSTEMI) myocardial infarction: Secondary | ICD-10-CM

## 2010-06-22 DIAGNOSIS — D709 Neutropenia, unspecified: Secondary | ICD-10-CM

## 2010-06-22 DIAGNOSIS — I2510 Atherosclerotic heart disease of native coronary artery without angina pectoris: Secondary | ICD-10-CM

## 2010-06-22 DIAGNOSIS — R4701 Aphasia: Secondary | ICD-10-CM

## 2010-06-22 DIAGNOSIS — I428 Other cardiomyopathies: Secondary | ICD-10-CM

## 2010-06-22 DIAGNOSIS — I509 Heart failure, unspecified: Secondary | ICD-10-CM

## 2010-06-22 DIAGNOSIS — Z7902 Long term (current) use of antithrombotics/antiplatelets: Secondary | ICD-10-CM

## 2010-06-22 DIAGNOSIS — Z833 Family history of diabetes mellitus: Secondary | ICD-10-CM

## 2010-06-22 DIAGNOSIS — Z5189 Encounter for other specified aftercare: Secondary | ICD-10-CM

## 2010-06-22 DIAGNOSIS — R1313 Dysphagia, pharyngeal phase: Secondary | ICD-10-CM

## 2010-06-22 DIAGNOSIS — Z880 Allergy status to penicillin: Secondary | ICD-10-CM

## 2010-06-22 LAB — GLUCOSE, CAPILLARY: Glucose-Capillary: 174 mg/dL — ABNORMAL HIGH (ref 70–99)

## 2010-06-23 DIAGNOSIS — Z5189 Encounter for other specified aftercare: Secondary | ICD-10-CM

## 2010-06-23 DIAGNOSIS — I6330 Cerebral infarction due to thrombosis of unspecified cerebral artery: Secondary | ICD-10-CM

## 2010-06-23 LAB — COMPREHENSIVE METABOLIC PANEL
AST: 35 U/L (ref 0–37)
Albumin: 3.3 g/dL — ABNORMAL LOW (ref 3.5–5.2)
Calcium: 9.7 mg/dL (ref 8.4–10.5)
Creatinine, Ser: 1.1 mg/dL (ref 0.4–1.2)
GFR calc Af Amer: >60 mL/min (ref >60)
Total Protein: 6.3 g/dL (ref 6.0–8.3)

## 2010-06-23 LAB — DIFFERENTIAL
Basophils Absolute: 0 10*3/uL (ref 0.0–0.1)
Basophils Relative: 0 % (ref 0–1)
Eosinophils Absolute: 0.1 10*3/uL (ref 0.0–0.7)
Eosinophils Relative: 3 % (ref 0–5)
Lymphocytes Relative: 32 % (ref 12–46)

## 2010-06-23 LAB — GLUCOSE, CAPILLARY
Glucose-Capillary: 95 mg/dL (ref 70–99)
Glucose-Capillary: 98 mg/dL (ref 70–99)

## 2010-06-23 LAB — CBC
HCT: 39.6 % (ref 36.0–46.0)
Platelets: 192 10*3/uL (ref 150–400)
RDW: 13.8 % (ref 11.5–15.5)
WBC: 3.1 10*3/uL — ABNORMAL LOW (ref 4.0–10.5)

## 2010-06-23 LAB — BASIC METABOLIC PANEL
BUN: 14 mg/dL (ref 6–23)
CO2: 28 mEq/L (ref 19–32)
Chloride: 106 mEq/L (ref 96–112)
Glucose, Bld: 86 mg/dL (ref 70–99)
Potassium: 4 mEq/L (ref 3.5–5.1)
Sodium: 142 mEq/L (ref 135–145)

## 2010-06-24 LAB — GLUCOSE, CAPILLARY
Glucose-Capillary: 80 mg/dL (ref 70–99)
Glucose-Capillary: 83 mg/dL (ref 70–99)

## 2010-06-24 LAB — BASIC METABOLIC PANEL
BUN: 12 mg/dL (ref 6–23)
GFR calc Af Amer: >60 mL/min (ref >60)
GFR calc non Af Amer: 56 mL/min — ABNORMAL LOW (ref >60)
Potassium: 4.4 mEq/L (ref 3.5–5.1)
Sodium: 143 mEq/L (ref 135–145)

## 2010-06-26 NOTE — Discharge Summary (Signed)
NAME:  [name], [name]              ACCOUNT NO.:  [id number]  MEDICAL RECORD NO.:  [id number]           PATIENT TYPE:  I  LOCATION:  3022                         FACILITY:  [facility]  PHYSICIAN:  Hezekiah Veltre P. Pearlean Brownie, MD    DATE OF BIRTH:  Aug 21, 1945  DATE OF ADMISSION:  06/18/2010 DATE OF DISCHARGE:  06/22/2010                              DISCHARGE SUMMARY   DIAGNOSES AT TIME OF DISCHARGE: 1. Left brain infarct not seen on imaging, status post full-dose IV t-     PA. 2. 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 3. Non-ST elevation myocardial infarction, cocaine-induced, November     2011. 4. Multiple admissions for chest pain. 5. Compensated systolic heart failure. 6. Chronic obstructive pulmonary disease. 7. Hypertension. 8. Tobacco abuse. 9. Polysubstance abuse including cocaine. 10.Mild renal insufficiency. 11.Breast cancer. 12.Left bundle-branch block. 13.Status post permanent pacemaker placement, status post removal in     October 2011. 14.History of childhood grand mal seizures. 15.Psychotic disorder, not otherwise specified per Psychiatric     evaluation in October 2011 with chronic medical and psych issues. 16.Left posterior cerebral artery infarct May 2011 secondary to     cocaine use. 17.History of head trauma with loss of consciousness greater than 20     years ago.  MEDICINES AT TIME OF DISCHARGE: 1. Lovenox 30 mg subcu daily. 2. Albuterol one vial q.6 h. p.r.n. difficulty breathing. 3. Aspirin 81 mg a day. 4. Coreg 3.125 mg b.i.d. with meals. 5. Docusate sodium 100 mg b.i.d. 6. Lasix 40 mg a day. 7. Isosorbide 30 mg one tablet a day. 8. Lisinopril 10 mg a day. 9. Nitroglycerin 0.4 mg sublingual q. 5 minutes as needed up to three     doses for chest pain. 10.Omeprazole one capsule every morning. 11.Plavix 75 mg a day. 12.Proventil two puffs q.6 h. p.r.n. 13.Seroquel 50 mg nightly. 14.Simvastatin 10 mg one tablet a day. 15.Spiriva 18 mcg one  capsule a day. 16.Symbicort 80/4.5 mcg two puffs q.12 h. 17.Trazodone 50 mg one tablet nightly for sleep. 18.Zyprexa 2.5 mg a day.  STUDIES PERFORMED: 1. CT of the brain on admission shows old ischemic changes in the     right frontal lobe and left occipital lobe.  Indeterminate     hyperdensity, left occipital lobe.  Radiologist read as the area of     petechial hemorrhage cannot be excluded, although it may be     artifactual. 2. CT of the head 24 hours post t-PA shows stable remote infarcts     without acute hemispheric infarct and intracranial hemorrhage. 3. MRI of the brain ordered, but not tolerated by the patient. 4. A 2-D echocardiogram shows EF of 20% with akinesis of the entire     anteroseptal myocardium.  There is akinesis of the apical     myocardium.  Increased relative contribution of atrial contraction     of ventricular filling.  There is a possible medium-sized flat     mural thrombus.  Compared to echo in October 2011, EF was 15-20%     also with severe hypokinesis of the entire myocardium.  Akinesis of  middistal anteroseptal myocardium.  No mention of thrombus. 5. TEE performed on May 27, 2009 shows EF of 25-30% with hypokinesis     of the lateral myocardium.  Hypokinesis of the inferior myocardium.     Akinesis of the anteroseptal myocardium.  Akinesis of the inferior     septal myocardium.  No mentions of LV clot.  There was spontaneous     echo smoke in the left atrium and no other source of embolus     mentioned.  The aorta was mildly calcified. 6. Carotid Doppler shows no significant ICA stenosis.  Vertebrals are     patent with antegrade flow. 7. EKG shows sinus rhythm with possible PACs with aberrant conduction.     Left bundle-branch block.  LABORATORY STUDIES:  Homocystine 10.  Cardiac enzymes initially CK 104, CK-MB 5.7, and troponin-I 1.64.  Followup cardiac enzymes with CK 140, CK-MB 6.9, and troponin-I 1.6.  Third set cardiac enzymes with  troponin- I 1.35, CK 627 and CK-MB 9.1.  Urine culture no growth.  Cholesterol 325, triglycerides 67, HDL 99, LDL 113, hemoglobin A1c 6.0.  MRSA screening negative.  Chemistry with potassium 3.4, BUN 12, creatinine 1.86, GFR greater than 60.  Coagulation studies with PT 14.3, INR 1.09. Repeated coags show INR of 1.51 (not on Coumadin).  Urine drug screen negative.  HISTORY OF PRESENT ILLNESS:  [name] is a 65 year old African American female with history of multiple medical problems who awakened the morning of admission normal.  She lives with her daughter.  Her daughter noted that 10:30 a.m. that she was having difficulty using her right upper extremity.  The patient then started to drool from her mouth and was unable to speak appropriately.  EMS was notified.  The patient was brought in for evaluation.  Code Stroke was called.  It should be noted that the patient does have a chronic limb with weakness in the left lower extremity per family secondary to old stroke.  CT of the brain showed old stroke, but no acute abnormality.  She was felt to be a candidate for t-PA and full-dose IV t-PA was administered.  Of note, her blood pressure on arrival was 149/103.  The blood pressure was treated with IV labetalol.  After multiple attempt at obtaining IV access, full- dose IV t-PA was given and the patient was admitted to the Neuro ICU.  HOSPITAL COURSE:  The patient tolerated t-PA without difficulty. Followup imaging showed no hemorrhage.  The patient was started back on her aspirin and Plavix as prior to admission.  MRI was attempted to confirm stroke diagnosis, though the patient was unable to tolerate. Repeat CT showed no acute stroke, though it is likely too small to be seen on CT.  The patient has multiple vascular risk factors. It is felt the stroke is subcortical secondary to small- vessel disease.  Aspirin and Plavix will be continued for secondary stroke prevention.  Therapy  evaluated the patient and felt to benefit from inpatient rehab. She had difficulty with swallowing and was placed on the dysphasia 1 nectar-thick liquid diet.  [name] daughters plan to provide care for her at the time of discharge, so plan will be for continuation of therapies on rehab prior to discharge home with them.  A 2-D echocardiogram reported out possible flat mural thrombus.  The patient has had multiple echos in the past all showing ongoing hypokinesis and low ejection fraction.  TEE performed 1 year ago shows no evidence of thrombus.  Given the patient's high risk for Coumadin given her risk for fall and ongoing cocaine use, it was felt she was not a Coumadin candidate and further workup should not be pursued.  We will continue on aspirin and Plavix at the time of discharge.  CONDITION AT TIME OF DISCHARGE:  The patient is alert and oriented x3, mild dysarthria, does follow commands, has occasional word-finding difficulty.  Her eye movements are full.  She has no drift in her upper extremities though she has a decreased right grip.  She has decreased fine motor movement on the right.  She has good lower extremity movements, left greater than right.  Her gait is unsteady.  DISCHARGE PLAN: 1. Discharge to rehab for ongoing PT, OT, and speech therapy. 2. Dysphagia 1 nectar-thick liquid diet. 3. Aspirin and Plavix for secondary stroke prevention. 4. Follow up primary care physician after discharge from rehab. 5. Follow up Dr. Pearlean Brownie in his Stroke Clinic in 1-2 months.     Annie Main, N.P.   ______________________________ Sunny Schlein. Pearlean Brownie, MD    SB/MEDQ  D:  06/22/2010  T:  06/22/2010  Job:  [id number]  cc:   Osvaldo Shipper. Spruill, M.D. Eduardo Osier. Sharyn Lull, M.D.  Electronically Signed by Annie Main N.P. on 06/24/2010 09:13:15 AM Electronically Signed by Delia Heady MD on 06/26/2010 01:51:20 PM

## 2010-06-28 ENCOUNTER — Ambulatory Visit: Payer: Medicare Other | Admitting: Internal Medicine

## 2010-06-29 DIAGNOSIS — I6330 Cerebral infarction due to thrombosis of unspecified cerebral artery: Secondary | ICD-10-CM

## 2010-06-29 DIAGNOSIS — I69993 Ataxia following unspecified cerebrovascular disease: Secondary | ICD-10-CM

## 2010-06-29 DIAGNOSIS — Z5189 Encounter for other specified aftercare: Secondary | ICD-10-CM

## 2010-06-29 DIAGNOSIS — I69919 Unspecified symptoms and signs involving cognitive functions following unspecified cerebrovascular disease: Secondary | ICD-10-CM

## 2010-06-30 LAB — CBC
HCT: 39.6 % (ref 36.0–46.0)
MCHC: 32.3 g/dL (ref 30.0–36.0)
Platelets: 213 10*3/uL (ref 150–400)
RDW: 14.1 % (ref 11.5–15.5)

## 2010-07-02 ENCOUNTER — Inpatient Hospital Stay (HOSPITAL_COMMUNITY): Payer: Medicare Other

## 2010-07-02 DIAGNOSIS — I69993 Ataxia following unspecified cerebrovascular disease: Secondary | ICD-10-CM

## 2010-07-02 DIAGNOSIS — F54 Psychological and behavioral factors associated with disorders or diseases classified elsewhere: Secondary | ICD-10-CM

## 2010-07-02 DIAGNOSIS — G8110 Spastic hemiplegia affecting unspecified side: Secondary | ICD-10-CM

## 2010-07-02 DIAGNOSIS — Z5189 Encounter for other specified aftercare: Secondary | ICD-10-CM

## 2010-07-02 DIAGNOSIS — I6350 Cerebral infarction due to unspecified occlusion or stenosis of unspecified cerebral artery: Secondary | ICD-10-CM

## 2010-07-02 DIAGNOSIS — I6330 Cerebral infarction due to thrombosis of unspecified cerebral artery: Secondary | ICD-10-CM

## 2010-07-04 LAB — BASIC METABOLIC PANEL
Calcium: 9.9 mg/dL (ref 8.4–10.5)
GFR calc non Af Amer: 37 mL/min — ABNORMAL LOW (ref >60)
Glucose, Bld: 76 mg/dL (ref 70–99)
Sodium: 140 mEq/L (ref 135–145)

## 2010-07-05 ENCOUNTER — Inpatient Hospital Stay (HOSPITAL_COMMUNITY): Payer: Medicare Other

## 2010-07-05 DIAGNOSIS — I6330 Cerebral infarction due to thrombosis of unspecified cerebral artery: Secondary | ICD-10-CM

## 2010-07-05 DIAGNOSIS — I69993 Ataxia following unspecified cerebrovascular disease: Secondary | ICD-10-CM

## 2010-07-05 DIAGNOSIS — Z5189 Encounter for other specified aftercare: Secondary | ICD-10-CM

## 2010-07-05 DIAGNOSIS — G8110 Spastic hemiplegia affecting unspecified side: Secondary | ICD-10-CM

## 2010-07-06 LAB — BASIC METABOLIC PANEL
BUN: 35 mg/dL — ABNORMAL HIGH (ref 6–23)
Creatinine, Ser: 1.24 mg/dL — ABNORMAL HIGH (ref 0.4–1.2)
GFR calc non Af Amer: 43 mL/min — ABNORMAL LOW (ref >60)
Potassium: 4.4 mEq/L (ref 3.5–5.1)

## 2010-07-11 ENCOUNTER — Emergency Department (HOSPITAL_COMMUNITY): Payer: Medicare Other

## 2010-07-11 ENCOUNTER — Inpatient Hospital Stay (HOSPITAL_COMMUNITY)
Admission: EM | Admit: 2010-07-11 | Discharge: 2010-07-22 | DRG: 184 | Disposition: A | Discharge status: Skilled Nursing Facility | Payer: Medicare Other | Source: Ambulatory Visit | Attending: General Surgery | Admitting: General Surgery

## 2010-07-11 ENCOUNTER — Emergency Department (HOSPITAL_COMMUNITY)
Admission: EM | Admit: 2010-07-11 | Discharge: 2010-07-11 | Disposition: A | Discharge status: Home / Self Care | Payer: Medicare Other | Source: Home / Self Care | Attending: Emergency Medicine | Admitting: Emergency Medicine

## 2010-07-11 DIAGNOSIS — F1910 Other psychoactive substance abuse, uncomplicated: Secondary | ICD-10-CM | POA: Diagnosis present

## 2010-07-11 DIAGNOSIS — I2510 Atherosclerotic heart disease of native coronary artery without angina pectoris: Secondary | ICD-10-CM | POA: Diagnosis present

## 2010-07-11 DIAGNOSIS — F17200 Nicotine dependence, unspecified, uncomplicated: Secondary | ICD-10-CM | POA: Diagnosis present

## 2010-07-11 DIAGNOSIS — D62 Acute posthemorrhagic anemia: Secondary | ICD-10-CM | POA: Diagnosis present

## 2010-07-11 DIAGNOSIS — I639 Cerebral infarction, unspecified: Secondary | ICD-10-CM

## 2010-07-11 DIAGNOSIS — Z7982 Long term (current) use of aspirin: Secondary | ICD-10-CM

## 2010-07-11 DIAGNOSIS — F209 Schizophrenia, unspecified: Secondary | ICD-10-CM | POA: Diagnosis present

## 2010-07-11 DIAGNOSIS — W19XXXA Unspecified fall, initial encounter: Secondary | ICD-10-CM | POA: Diagnosis present

## 2010-07-11 DIAGNOSIS — I69998 Other sequelae following unspecified cerebrovascular disease: Secondary | ICD-10-CM

## 2010-07-11 DIAGNOSIS — G4733 Obstructive sleep apnea (adult) (pediatric): Secondary | ICD-10-CM | POA: Diagnosis present

## 2010-07-11 DIAGNOSIS — I252 Old myocardial infarction: Secondary | ICD-10-CM

## 2010-07-11 DIAGNOSIS — J4489 Other specified chronic obstructive pulmonary disease: Secondary | ICD-10-CM | POA: Diagnosis present

## 2010-07-11 DIAGNOSIS — Z7902 Long term (current) use of antithrombotics/antiplatelets: Secondary | ICD-10-CM

## 2010-07-11 DIAGNOSIS — S270XXA Traumatic pneumothorax, initial encounter: Secondary | ICD-10-CM | POA: Diagnosis present

## 2010-07-11 DIAGNOSIS — I428 Other cardiomyopathies: Secondary | ICD-10-CM | POA: Diagnosis present

## 2010-07-11 DIAGNOSIS — E785 Hyperlipidemia, unspecified: Secondary | ICD-10-CM | POA: Diagnosis present

## 2010-07-11 DIAGNOSIS — J449 Chronic obstructive pulmonary disease, unspecified: Secondary | ICD-10-CM | POA: Diagnosis present

## 2010-07-11 DIAGNOSIS — R29898 Other symptoms and signs involving the musculoskeletal system: Secondary | ICD-10-CM | POA: Diagnosis present

## 2010-07-11 DIAGNOSIS — I10 Essential (primary) hypertension: Secondary | ICD-10-CM | POA: Diagnosis present

## 2010-07-11 DIAGNOSIS — S2249XA Multiple fractures of ribs, unspecified side, initial encounter for closed fracture: Principal | ICD-10-CM | POA: Diagnosis present

## 2010-07-11 HISTORY — DX: Cerebral infarction, unspecified: I63.9

## 2010-07-11 LAB — CBC
HCT: 36.5 % (ref 36.0–46.0)
Hemoglobin: 11.8 g/dL — ABNORMAL LOW (ref 12.0–15.0)
MCH: 29.9 pg (ref 26.0–34.0)
MCHC: 32.3 g/dL (ref 30.0–36.0)
MCV: 92.6 fL (ref 78.0–100.0)
Platelets: 210 10*3/uL (ref 150–400)
RBC: 3.94 MIL/uL (ref 3.87–5.11)
RDW: 13.1 % (ref 11.5–15.5)
WBC: 11.5 10*3/uL — ABNORMAL HIGH (ref 4.0–10.5)

## 2010-07-11 LAB — URINALYSIS, ROUTINE W REFLEX MICROSCOPIC
Bilirubin Urine: NEGATIVE
Glucose, UA: NEGATIVE mg/dL
Ketones, ur: NEGATIVE mg/dL
Leukocytes, UA: NEGATIVE
Nitrite: NEGATIVE
Protein, ur: NEGATIVE mg/dL
Specific Gravity, Urine: 1.023 (ref 1.005–1.030)
Urobilinogen, UA: 0.2 mg/dL (ref 0.0–1.0)
pH: 5.5 (ref 5.0–8.0)

## 2010-07-11 LAB — COMPREHENSIVE METABOLIC PANEL
Albumin: 4 g/dL (ref 3.5–5.2)
Alkaline Phosphatase: 68 U/L (ref 39–117)
BUN: 17 mg/dL (ref 6–23)
Calcium: 10 mg/dL (ref 8.4–10.5)
Glucose, Bld: 108 mg/dL — ABNORMAL HIGH (ref 70–99)
Potassium: 3.8 mEq/L (ref 3.5–5.1)
Sodium: 139 mEq/L (ref 135–145)
Total Protein: 7.1 g/dL (ref 6.0–8.3)

## 2010-07-11 LAB — DIFFERENTIAL
Basophils Absolute: 0 K/uL (ref 0.0–0.1)
Basophils Relative: 0 % (ref 0–1)
Eosinophils Absolute: 0 10*3/uL (ref 0.0–0.7)
Eosinophils Relative: 0 % (ref 0–5)
Lymphocytes Relative: 5 % — ABNORMAL LOW (ref 12–46)
Lymphs Abs: 0.6 K/uL — ABNORMAL LOW (ref 0.7–4.0)
Monocytes Absolute: 0.8 K/uL (ref 0.1–1.0)
Monocytes Relative: 7 % (ref 3–12)
Neutro Abs: 10.1 K/uL — ABNORMAL HIGH (ref 1.7–7.7)
Neutrophils Relative %: 87 % — ABNORMAL HIGH (ref 43–77)

## 2010-07-11 LAB — URINE MICROSCOPIC-ADD ON

## 2010-07-11 LAB — POCT CARDIAC MARKERS
CKMB, poc: 5.6 ng/mL (ref 1.0–8.0)
Myoglobin, poc: 263 ng/mL (ref 12–200)
Troponin i, poc: 0.07 ng/mL (ref 0.00–0.09)

## 2010-07-11 LAB — COMPREHENSIVE METABOLIC PANEL WITH GFR
ALT: 11 U/L (ref 0–35)
AST: 29 U/L (ref 0–37)
CO2: 23 meq/L (ref 19–32)
Chloride: 106 meq/L (ref 96–112)
Creatinine, Ser: 0.96 mg/dL (ref 0.4–1.2)
GFR calc Af Amer: >60 mL/min (ref >60)
GFR calc non Af Amer: 58 mL/min — ABNORMAL LOW (ref >60)
Total Bilirubin: 0.3 mg/dL (ref 0.3–1.2)

## 2010-07-11 LAB — MRSA PCR SCREENING: MRSA by PCR: NEGATIVE

## 2010-07-11 MED ORDER — IOHEXOL 300 MG/ML  SOLN
100.0000 mL | Freq: Once | INTRAMUSCULAR | Status: AC | PRN
  Administered 2010-07-11: 100 mL via INTRAVENOUS

## 2010-07-12 ENCOUNTER — Inpatient Hospital Stay (HOSPITAL_COMMUNITY): Payer: Medicare Other

## 2010-07-12 LAB — CBC
Hemoglobin: 11.2 g/dL — ABNORMAL LOW (ref 12.0–15.0)
MCH: 30.3 pg (ref 26.0–34.0)
MCHC: 32.4 g/dL (ref 30.0–36.0)
Platelets: 178 10*3/uL (ref 150–400)
RDW: 13.4 % (ref 11.5–15.5)

## 2010-07-12 LAB — BASIC METABOLIC PANEL
CO2: 23 mEq/L (ref 19–32)
Calcium: 8.7 mg/dL (ref 8.4–10.5)
Creatinine, Ser: 0.91 mg/dL (ref 0.4–1.2)
GFR calc Af Amer: >60 mL/min (ref >60)
GFR calc non Af Amer: >60 mL/min (ref >60)
Sodium: 141 mEq/L (ref 135–145)

## 2010-07-13 ENCOUNTER — Inpatient Hospital Stay (HOSPITAL_COMMUNITY): Payer: Medicare Other

## 2010-07-14 ENCOUNTER — Inpatient Hospital Stay (HOSPITAL_COMMUNITY): Payer: Medicare Other

## 2010-07-14 LAB — COMPREHENSIVE METABOLIC PANEL
AST: 48 U/L — ABNORMAL HIGH (ref 0–37)
Albumin: 2.5 g/dL — ABNORMAL LOW (ref 3.5–5.2)
Alkaline Phosphatase: 46 U/L (ref 39–117)
BUN: 8 mg/dL (ref 6–23)
Creatinine, Ser: 0.99 mg/dL (ref 0.4–1.2)
GFR calc Af Amer: >60 mL/min (ref >60)
Potassium: 3.5 mEq/L (ref 3.5–5.1)
Total Protein: 5.7 g/dL — ABNORMAL LOW (ref 6.0–8.3)

## 2010-07-14 LAB — CBC
Hemoglobin: 9.4 g/dL — ABNORMAL LOW (ref 12.0–15.0)
MCH: 30.2 pg (ref 26.0–34.0)
Platelets: 154 10*3/uL (ref 150–400)
RBC: 3.11 MIL/uL — ABNORMAL LOW (ref 3.87–5.11)

## 2010-07-14 LAB — CARDIAC PANEL(CRET KIN+CKTOT+MB+TROPI)
CK, MB: 4.2 ng/mL — ABNORMAL HIGH (ref 0.3–4.0)
Total CK: 1240 U/L — ABNORMAL HIGH (ref 7–177)

## 2010-07-14 LAB — AMMONIA: Ammonia: 56 umol/L (ref 11–60)

## 2010-07-15 ENCOUNTER — Inpatient Hospital Stay (HOSPITAL_COMMUNITY): Payer: Medicare Other

## 2010-07-15 LAB — CBC
Hemoglobin: 9.5 g/dL — ABNORMAL LOW (ref 12.0–15.0)
Platelets: 75 10*3/uL — ABNORMAL LOW (ref 150–400)
RBC: 3.08 MIL/uL — ABNORMAL LOW (ref 3.87–5.11)
WBC: 4 10*3/uL (ref 4.0–10.5)

## 2010-07-15 LAB — BASIC METABOLIC PANEL
Chloride: 105 mEq/L (ref 96–112)
GFR calc Af Amer: >60 mL/min (ref >60)
Potassium: 3.7 mEq/L (ref 3.5–5.1)

## 2010-07-15 LAB — PRO B NATRIURETIC PEPTIDE: Pro B Natriuretic peptide (BNP): 534.3 pg/mL — ABNORMAL HIGH (ref 0–125)

## 2010-07-16 ENCOUNTER — Inpatient Hospital Stay (HOSPITAL_COMMUNITY): Payer: Medicare Other

## 2010-07-16 LAB — BASIC METABOLIC PANEL
CO2: 29 mEq/L (ref 19–32)
Calcium: 8.8 mg/dL (ref 8.4–10.5)
Chloride: 105 mEq/L (ref 96–112)
GFR calc Af Amer: >60 mL/min (ref >60)
Sodium: 139 mEq/L (ref 135–145)

## 2010-07-16 LAB — CBC
Hemoglobin: 9.1 g/dL — ABNORMAL LOW (ref 12.0–15.0)
MCHC: 34.5 g/dL (ref 30.0–36.0)
RBC: 2.91 MIL/uL — ABNORMAL LOW (ref 3.87–5.11)

## 2010-07-20 NOTE — Op Note (Signed)
  NAME:  [name], LINDFORS NO.:  [id number]  MEDICAL RECORD NO.:  [id number]           PATIENT TYPE:  I  LOCATION:  3103                         FACILITY:  [facility]  PHYSICIAN:  Adolph Pollack, M.D.DATE OF BIRTH:  08/09/1945  DATE OF PROCEDURE:  07/11/2010 DATE OF DISCHARGE:                              OPERATIVE REPORT   PREOPERATIVE DIAGNOSIS:  Left pneumothorax.  POSTOPERATIVE DIAGNOSIS:  Left pneumothorax.  PROCEDURE:  Insertion of a size 32-French chest tube into left pleural space.  SURGEON:  Adolph Pollack, MD  ANESTHESIA:  Lidocaine local.  INDICATIONS:  This is a 65 year old female with a fall, multiple rib fractures, and left pneumothorax.  She is somewhat tachypneic and a chest tube is indicated.  This is an emergency procedure.  The left chest wall was sterilely prepped and draped.  Local anesthetic was infiltrated approximately over the eighth rib.  An incision was made over the eighth rib and tunnel made using a hemostat up to the sixth intercostal space.  I then entered the sixth intercostal space of the anterior axillary line and a rush of air was noted.  A size 32-French chest tube was then placed with air being evacuated through the tube.  It was anchored to the skin with a 0 silk suture.  It was then hooked up to the Pleur-Evac and air leak was noted.  The Vaseline gauze was placed around the insertion site followed by a bulky dressing.  She tolerated the procedure well and she stated her breathing was much easier and she was less short of breath after chest tube placement.  She is going to do a CT of the chest and that will also confirm position.     Adolph Pollack, M.D.     Kari Baars  D:  07/11/2010  T:  07/11/2010  Job:  [id number]  Electronically Signed by Avel Peace M.D. on 07/20/2010 10:52:19 AM

## 2010-07-20 NOTE — H&P (Signed)
NAME:  [name], [name] NO.:  [id number]  MEDICAL RECORD NO.:  [id number]           PATIENT TYPE:  E  LOCATION:  WLED                         FACILITY:  [facility]  PHYSICIAN:  Adolph Pollack, M.D.DATE OF BIRTH:  August 29, 1945  DATE OF ADMISSION:  07/11/2010 DATE OF DISCHARGE:  07/11/2010                             HISTORY & PHYSICAL   HISTORY:  This is a 65 year old female who was recently discharged from the hospital 4 days ago following an acute stroke.  Apparently, she fell on her left side and was brought by the EMS Service to [facility] where she was evaluated and found to have multiple left rib fractures and pneumothorax.  At that time, I was called to see her.  She is amnestic to the event.  She was complaining of chest wall pain and shortness of breath on arrival.  PAST MEDICAL HISTORY: 1. Coronary artery disease, status post myocardial infarction, May 21, 2010. 2. Left cerebrovascular accident with recent discharge, Jul 11, 2010. 3. Right cerebrovascular accident in the past. 4. Schizophrenia. 5. Coronary artery disease. 6. Hypertension. 7. COPD. 8. Obstructive sleep apnea.  PREVIOUS OPERATIONS:  Hysterectomy.  ALLERGIES:  CODEINE, PENICILLIN, and HYDRALAZINE.  MEDICATIONS:  Plavix, aspirin, Zyprexa, Prilosec, Seroquel, Zocor, Spiriva, vitamin B12, folate, and Isordil.  SOCIAL HISTORY:  Lives with her daughter.  She has a history of cocaine abuse, and was using cocaine fairly recently around the time of her myocardial infarction.  She is a former smoker.  Occasionally, has an alcoholic beverage.  REVIEW OF SYSTEMS:  Limited by her tachypnea and intermittent confusion.  PHYSICAL EXAM:  GENERAL:  An elderly female with mild tachypnea. VITAL SIGNS:  Temperature is 97.7, pulse 90, respiratory rate 20, blood pressure is 154/103, O2 sats 94%. HEENT:  Normocephalic, atraumatic.  EOMI.  No bleeding from the nares  or mouth. NECK:  C-collar is on.  No C-spine tenderness. PULMONARY:  There is left lateral chest wall pain and significant subcutaneous emphysema with decreased breath sounds on the left. CARDIOVASCULAR:  Increased rate. ABDOMEN:  Soft, nontender.  No abrasions. PELVIS:  No tenderness or instability. MUSCULOSKELETAL:  No obvious bony lesions or deformities. BACK:  No spinal tenderness or deformity. NEUROLOGIC:  She is alert and oriented x1.  She has decreased left extremity strength versus the right side.  Glasgow coma scale is 15.  LABORATORY DATA:  Electrolytes within normal limits except for glucose of 108.  Hemoglobin 11.8, platelet count 210,000, white cell count 11,500, myoglobin slightly elevated at 263.  Chest x-ray demonstrates left fifth to seven rib fractures and left pneumothorax and subcutaneous air.  IMPRESSION: 1. Fall with the left rib fracture and left pneumothorax. 2. Incomplete workup. 3. Recent cerebrovascular accident, myocardial infarction. 4. History of cocaine abuse.  PLAN:  We will transfer her [facility].  We will obtaina CT of the head, neck, chest, abdomen, and pelvis.  We will insert a left chest tube for pneumothorax.  No family is present at this time.     Adolph Pollack, M.D.     Kari Baars  D:  07/11/2010  T:  07/11/2010  Job:  [id number]  Electronically Signed by Avel Peace M.D. on 07/20/2010 10:52:01 AM

## 2010-07-21 NOTE — Discharge Summary (Signed)
NAME:  [name] NO.:  [id number]  MEDICAL RECORD NO.:  [id number]           PATIENT TYPE:  I  LOCATION:  4147                         FACILITY:  [facility]  PHYSICIAN:  Erick Colace, M.D.DATE OF BIRTH:  10/08/1945  DATE OF ADMISSION:  06/22/2010 DATE OF DISCHARGE:  07/07/2010                              DISCHARGE SUMMARY   DISCHARGE DIAGNOSES: 1. Left acute subcortical infarct and remote right anterior     communicating artery infarct. 2. History of cocaine abuse with recent ST-elevation myocardial     infarction. 3. Hypertension on downward trend. 4. Chronic obstructive pulmonary disease. 5. History of psychosis. 6. Dyslipidemia.  HISTORY OF PRESENT ILLNESS:  [name] is a 66 year old female with history of recent MI in March 2012, due to cocaine use, remote right ACA infarct admitted June 18, 2010, with difficulty using the right upper extremity, drooling and difficulty with speech.  MRI of brain not done due to the patient's inability to tolerate it.  CT of head done on April 15, 27, and 28, 2012, all of which were negative for acute changes and stable remote infarcts in the right ACA territory. Carotid Dopplers done showed no ICA stenosis.  Two-D echo done showed mild LVH with EF of 20% and possible medium-sized mural thrombus, akinesis of entire anteroseptal and apical myocardium.  Neuro evaluated the patient and recommended continuing aspirin, Plavix for CVA prophylaxis as the patient is too high risk for Coumadin treatment. Swallow eval was done showing the patient with aspiration of thin and difficulty with mechanical soft diet and she was downgraded to D1 diet nectar liquids.  Currently, the patient continues with moderate dysarthria, mild expressive language deficits, mild-to-moderate deficits in memory and problem solving.  She continues with decreased fine motor movement of her right upper extremity with weakness.  The  patient was evaluated by rehab and we felt that she would benefit from a CIR program.  PAST MEDICAL HISTORY:  Significant for: 1. Hypertension. 2. COPD. 3. Schizophrenia. 4. Breast biopsy. 5. PTSD. 6. History of seizures as a child. 7. Right CVA with left lower extremity weakness. 8. History of suicide attempt. 9. TAH and BSO. 10.CHF. 11.Obstructive sleep apnea. 12.Recent NSTEMI secondary to cocaine use and dilated cardiomyopathy. 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  ALLERGIES:  CODEINE, PENICILLIN, and APRESOLINE.  FAMILY HISTORY:  Positive for cancer and diabetes.  SOCIAL HISTORY:  The patient is disabled, lives with daughter and was independent with a walker prior to admission.  Quit tobacco year ago. Uses alcohol once a week.  Last cocaine use a month ago.  FUNCTIONAL HISTORY:  The patient was independent with a walker prior to admission.  FUNCTIONAL STATUS:  The patient is mod assist bed mobility, mod assist transfers with posterior lean, min assist upper body care, mod assist lower body care.  PHYSICAL EXAMINATION:  VITAL SIGNS:  Blood pressure 131/74, pulse 54, respiratory rate 16, temperature 97.2. GENERAL:  The patient is thin female, quiet, flat affect, no acute distress. HEENT:  Pupils equal, round, reactive to light.  Oral mucosa is moist with thrush noted on tongue, full set dentures in place.  NECK:  Supple without JVD or lymphadenopathy. LUNGS:  Clear to auscultation bilaterally. HEART:  Regular rate and rhythm with systolic murmur. EXTREMITIES:  No evidence of clubbing, cyanosis or edema. ABDOMEN:  Soft, nontender with positive bowel sounds. SKIN:  Notable for chronic changes, no breakdown. NEUROLOGIC:  Cranial nerves II-XII notable for right central VII with tongue deviation.  Speech is slurred with decreased phonation. Sensation is grossly intact to pain.  Strength is notable for mild right upper extremity weakness with essentially 3+-4/5 proximal to distal. She  is 4-4+/5 on left upper extremity, left lower extremity  she is 3- to 3+/5 proximally, 2- to 2+/5 distally right, lower extremity she is 3+/5 proximally, 3+ to 4-/5 distally.  The patient inconsistent with exam.  Judgment, orientation, memory are all poor.  The patient with word-finding deficits.  Reflexes 1+, no resting tone noted.  HOSPITAL COURSE:  [name] was admitted to rehab on Jun 23, 2010, for inpatient therapies to consist of PT, OT and speech therapy at least 3 hours 5 days a week.  Past admission physiatrist, rehab RN, and therapy team have worked together to provide customized collaborative interdisciplinary care.  Initially, the patient was on pureed diet with nectar liquids.  Rehab RN has worked with the patient on pushing nectar liquids to help the patient maintain a hydration status.  The patient was noted to have some issues with sedation initially.  Zyprexa was discontinued.  However, as the patient with increased mentation and agitation, the Zyprexa was resumed.  Routine check of labs past admission revealed H and H at 13.1 and 39.6, white count 3.1, platelets 192.  Check of lytes revealed hypokalemia with potassium at 2.8 and this was supplemented.  Follow up check of lytes shows hypokalemia to have resolved.  [name] blood pressures were checked on b.i.d. basis and these were noted to be on a downward trend.  [name] BP meds have slowly being discontinued.  Additionally, check of lytes on Jul 04, 2010, revealed the patient to be dehydrated with BUN at 35, creatinine at 1.24.  IV fluids were ordered to help with hydration, however, the patient refused these.  A followup swallow study was done on Jul 05, 2010, and the patient has been advanced to a D2 diet thin liquids. Anticipate with initiation of thin liquids, the patient's renal insufficiency should improve.  Family has been advised about pushing fluids past discharge and advised that the  patient needs to drink at least five glasses of fluid per day.  At the time of discharge, blood pressures are ranging from 90s to 110 systolic, 60s to 29F diastolic. Heart rate stable in 70s to 80s range.  During the patient's stay in rehab, weekly team conferences were held to monitor the patient's progress, set goals as well as discuss barriers to discharge.  At the time of admission, the patient was noted to have a moderate-to-severe pharyngeal dysphagia with expressive language deficits as well as disfluency with paraphrasias.  Ms. Sconyers has made gains during her stay.  She has been advanced to D2 diet with thin liquids.  She continues to require supervision due to right pocketing and requires cuing for small sips of liquids.  She is currently able to express basic wants and needs, however, when frustrated she tends to speak at a fast rate requiring mod assist with cues to slow down to help improve her intelligibility.  She continues to have decreased awareness and decreased insight, requiring min assist for problem  solving for simple tasks as well as poor recall of right upper extremity difficulty with use and a 24-hour supervision is recommended past discharge.  OT has been working with the patient on self-care tasks.  Neuromuscular reeducation has been ongoing to help the patient utilize right upper extremity and self-care tasks.  Currently, the patient continues to require verbal cues visually to attend to right upper extremity but she is showing increase functional use of right upper extremity.  She is currently at supervision level for all bathing and dressing tasks. Further followup home health OT to continue to followup to help improve functional use of right upper extremity as well as independence in safety with ADLs at home environment.  Physical Therapy has been working with the patient on mobility.  At time of admission, the patient was limited by left side extensor  tone, right hemiplegia with absent balance and writing reaction.  She was also noted to have poor safety and decreased attention to tasks with poor problem solving.  She requires supervision for navigation of stairs.  Family education has been done regarding gait with a rolling walker, stay navigation, car transfers as well as need for safety overall.  Further follow-up home health PT, OT, speech therapy has been set up by [facility].  On Jul 07, 2010, the patient is discharged to home.  DISCHARGE MEDICATIONS: 1. Folic acid 1 mg p.o. per day. 2. Vitamin B12 100 mg p.o. per day. 3. Symbicort 2 puffs b.i.d. 4. Spiriva 18 mcg 1 inhalation per day. 5. Zocor 10 mg p.o. at bedtime. 6. Seroquel 50 mg p.o. at bedtime. 7. Plavix 75 mg a day. 8. Multivitamin one per day. 9. Isordil 10 mg p.o. q.8 hours. 10.Zyprexa 2.5 mg p.o. per day. 11.Aspirin 81 mg p.o. per day. 12.Prilosec 20 mg p.o. per day. 13.Ensure supplements if p.o. intake poor.  DIET:  Low salt.  FLUID:  Thin liquids.  ACTIVITY LEVEL:  24-hour supervision.  No strenuous activity.  SPECIAL INSTRUCTIONS:  No driving.  No alcohol.  Needs to drink at least five glasses of fluids per day.  [facility] to provide PT, OT, Speech therapy and RN.  FOLLOWUP:  The patient to follow up with Dr. Wynn Banker on August 09, 2010, at 11 a.m. for 11:30 appointment.  Follow up with Dr. Pearlean Brownie in 6 weeks. Follow up with Dr. Sharyn Lull for routine check in 2 weeks.  Follow up with Psychiatry at mental health in the next couple of weeks.     Delle Reining, P.A.   ______________________________ Erick Colace, M.D.    PL/MEDQ  D:  07/07/2010  T:  07/08/2010  Job:  [id number]  cc:   Eduardo Osier. Sharyn Lull, M.D. Pramod P. Pearlean Brownie, MD  Electronically Signed by Osvaldo Shipper. on 07/16/2010 02:38:03 PM Electronically Signed by Claudette Laws M.D. on 07/21/2010 09:29:42 AM

## 2010-07-27 NOTE — H&P (Signed)
NAME:  [name], [name] NO.:  [id number]  MEDICAL RECORD NO.:  [id number]           PATIENT TYPE:  I  LOCATION:  4147                         FACILITY:  [facility]  PHYSICIAN:  Ranelle Oyster, M.D.DATE OF BIRTH:  04-25-1945  DATE OF ADMISSION:  06/22/2010 DATE OF DISCHARGE:                             HISTORY & PHYSICAL   PRIMARY CARE PHYSICIAN:  Mohan N. Sharyn Lull, MD  CHIEF COMPLAINTS:  Right-sided weakness, confusion.  HISTORY OF PRESENT ILLNESS:  This is a 65 year old female with cocaine abuse in her past who was admitted on April 27 with difficulty using right upper extremity, drooling, problems with speech, etc.  Head CT shows stable right ACA infarct, which was remote.  She was treated with t-PA for suspected small left subcortical stroke.  UDS was negative. Carotid Dopplers were negative for ICA stenosis.  A 2-D echo done yesterday showed mild LVH with ejection fraction 20% and possible medium- size mural thrombus, akinesis of the entire anteroseptal and apical myocardium.  Neurology recommended continue aspirin and Plavix for stroke prophylaxis as she is at high risk for Coumadin therapy.  The patient had a swallowing evaluation done and aspirated thin mechanical soft textures.  She was placed on D1 nectar liquid diet.  Speech has been following for cognitive and speech deficits as well.  I have asked to see the patient.  I saw her yesterday, potential inpatient rehab stay.  I felt after seeing the patient and examined her case that she would benefit from inpatient rehab stay.  REVIEW OF SYSTEMS:  Notable for anxiety, weakness, insomnia.  Full 12- point review is in the written H and P.  PAST MEDICAL HISTORY: 1. Positive for recent non-ST-elevation MI on April 12, which was felt     to be secondary to cocaine.  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 2. She has hypertensive. 3. COPD. 4. Schizophrenia. 5. Breast cancer. 6. History of  hyperglycemia. 7. PTSD. 8. History of seizures as a child. 9. 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 10.CHF. 11.TAH and BSO. 12.History of suicide attempts. 13.Obstructive sleep apnea. 14.Left breast biopsy which was benign. 15.She has a history of TBI and right PCA aneurysm.  FAMILY HISTORY:  Positive for cancer and diabetes.  SOCIAL HISTORY:  The patient is disabled, lives with her daughter, is independent prior to arrival.  She quit smoking a year ago, but still was using cocaine up to last month.  She occasionally drinks.  ALLERGIES:  CODEINE, PENICILLIN, and APRESOLINE.  HOME MEDICATIONS:  Please see written H and P.  LABORATORY DATA:  Hemoglobin 14.6, white count 4.6, platelets 213. Sodium 140, potassium 3.4, BUN 12, creatinine 1.06.  PHYSICAL EXAM:  VITAL SIGNS:  Blood pressure is 131/74, pulse is 54, respiratory rate 16, temperature 97.2.  GENERAL:  The patient is lying in bed, rather quiet, flat, very slight of build. HEENT:  Pupils equally round and reactive to light.  Nose and throat exam is notable for thrush over the tongue. NECK:  Supple without JVD or lymphadenopathy. CHEST:  Notable for decreased breath sounds  overall, but effort was poor. HEART:  Regular rate with systolic murmur. EXTREMITIES:  No clubbing, cyanosis, or edema. ABDOMEN:  Soft, nontender.  Bowel sounds are positive. SKIN:  Notable for some chronic changes, otherwise negative. NEUROLOGICAL:  Cranial nerves II through XII notable for right central VII and tongue deviation.  Speech is slurred and phonation overall was diminished.  Sensation is grossly intact to pain throughout, but the patient was so flat and inconsistent with sore to discern any focal deficits.  Strength is notable for mild right upper extremity weakness essentially 3+ to 4-/five, proximal distal.  She is approximately 4 to 4+ on the left upper extremity.  Left lower extremity, she is 3-3+/5 proximally when  she was 2-2+/5 at best distally.  Right lower extremity, she is 3+/5 proximally to 3+-4/5 distally.  Again, the patient is inconsistent with examination today.  Judgment, orientation, and memory are all poor.  She had difficulty with word finding and language as well.  The patient had no baseline tone.  The reflexes were all 1+ throughout.  POST ADMISSION PHYSICIAN EVALUATION: 1. Functional deficit secondary to left CVA.  I questioned whether     this might be cortical as the patient with right-sided weakness,     dysphagia, aphagia and mild-to-moderate cognitive deficits.     Question here is much of these cognitive and language deficits are     baseline, perhaps related to her prior brain injury and the stroke. 2. The patient is admitted to receive collaborative interdisciplinary     care between the physiatrist, rehab nursing staff, and therapy     team. 3. [name] level of medical complexity and substantial therapy     needs in context of that medical necessity cannot be provided at a     lesser intensity of care. 4. The patient experienced substantial functional loss from her     baseline.  Upon functional assessment currently, the patient is mod     assist bed mobility, mod assist transfers with posterior lean.     Gait has not been tested with min assist upper body, mod assist     lower body ADLs.  Premorbidly, she was independent with a walker.     Judging by the patient's diagnosis, physical exam and functional     history, the patient has potential for functional progress which     will result in measurable gains while inpatient rehab.  Gains will     be of substantial and practical use upon discharge to home in     facilitating mobility and self-care.  Interim changes in medical     status since our initial rehab consult are detailed above. 5. The physiatrist will provide 24-hour management of medical needs as     well as oversight of the therapy plan/treatment to  provide guidance     as appropriate regarding interaction of the two.  Medical problem     list and plan are below. 6. A 24-hour rehab nursing team will assist in manage of the patient's     skin care needs as well as bowel and bladder function, safety     awareness, nutrition, pain, integration of therapy concepts and     techniques. 7. PT will assess and treat for lower extremity strength, functional     mobility, gait, cognitive perceptual and visual perceptual     therapies, adaptive equipment, family and patient education, safety     with goals overall at a supervision to  modified independent to min     assist for basic mobility and balance tasks. 8. OT will assess and treat for upper extremity strength, ADLs,     adaptive equipment, functional mobility, upper extremity strength,     cognitive perceptual training, neuromuscular education with goals,     modified independent to min assist. 9. Speech and language pathology will assess and treat for swallowing     as well as speech and language deficits with goals ranging from     modified independent to min assist with cognition and supervision     to min assist with speech. 10.Case management and social worker will assess and treat for     psychosocial issues and discharge planning. 11.Team conference will be held weekly to assess progress towards     goals and to determine barriers at discharge. 12.The patient has demonstrated sufficient medical stability and     exercise capacity to tolerate at least 3 hours of therapy per day     at least 5 days per week. 13.Estimated length of stay is approximately 2-3 weeks.  Prognosis     good.  MEDICAL PROBLEM LIST AND PLAN: 1. Deep venous thrombosis prophylaxis, subcu Lovenox.  We will     increase 40 mg daily.  Observe for any signs and symptoms of     bleeding complications. 2. History of coronary artery disease and myocardial infarction as     well as further stroke prophylaxis:   Aspirin and Plavix on board.     Check platelets and no active signs of bleeding or blood loss at     this point.  Follow up routine CBCs clinically for now. 3. Coronary artery disease:  Continue above as well as Zocor and beta     blocker as well as Imdur for blood pressure/resistance control. 4. History of congestive heart failure:  Continue medications as     above.  Follow weights as well as Is and Os. 5. Chronic obstructive pulmonary disease:  The patient denies any     symptoms at present.  We will stay with Symbicort and Spiriva as     well as p.r.n. albuterol for wheezing.  The patient is at risk for     aspiration and would not tolerate this well given her CHF and COPD     history. 6. Hypertension:  Currently labile at present.  Check blood pressures     b.i.d. to t.i.d. and make medication changes as warranted going     forward.  Continue lisinopril as well as medications above as well     as Lasix. 7. History of psychosis:  Zyprexa q.a.m. with Seroquel at bedtime.     Trazodone will be given as needed also for sleep.  Follow up for     participation with therapy, interaction with staff, and behavioral     appropriateness. 8. Steroid-induced hyperglycemia:  Hemoglobin A1c 6.0.  We will     educate family on appropriate diet secondary risk factors related     to stroke. 9. Hypokalemia:  Likely secondary to diuretics.  Add low-dose     potassium supplement.  Follow BMET regularly.  The patient has a     history of renal failure as well and we will need to watch BUN and     creatinine especially while the patient is on nectar liquids.  Push     p.o. fluids and avoid IV fluid supplementation due to her heart     failure  history.     Ranelle Oyster, M.D.     ZTS/MEDQ  D:  06/22/2010  T:  06/23/2010  Job:  [id number]  cc:   Eduardo Osier. Sharyn Lull, M.D.  Electronically Signed by Faith Rogue M.D. on 07/27/2010 09:47:14 AM

## 2010-08-03 ENCOUNTER — Emergency Department (HOSPITAL_COMMUNITY)
Admission: EM | Admit: 2010-08-03 | Discharge: 2010-08-03 | Disposition: A | Discharge status: Home / Self Care | Payer: Medicare Other | Attending: Emergency Medicine | Admitting: Emergency Medicine

## 2010-08-03 ENCOUNTER — Emergency Department (HOSPITAL_COMMUNITY): Payer: Medicare Other

## 2010-08-03 DIAGNOSIS — J4489 Other specified chronic obstructive pulmonary disease: Secondary | ICD-10-CM | POA: Insufficient documentation

## 2010-08-03 DIAGNOSIS — I10 Essential (primary) hypertension: Secondary | ICD-10-CM | POA: Insufficient documentation

## 2010-08-03 DIAGNOSIS — Z79899 Other long term (current) drug therapy: Secondary | ICD-10-CM | POA: Insufficient documentation

## 2010-08-03 DIAGNOSIS — J449 Chronic obstructive pulmonary disease, unspecified: Secondary | ICD-10-CM | POA: Insufficient documentation

## 2010-08-03 DIAGNOSIS — Z87828 Personal history of other (healed) physical injury and trauma: Secondary | ICD-10-CM | POA: Insufficient documentation

## 2010-08-03 DIAGNOSIS — I252 Old myocardial infarction: Secondary | ICD-10-CM | POA: Insufficient documentation

## 2010-08-03 DIAGNOSIS — I2510 Atherosclerotic heart disease of native coronary artery without angina pectoris: Secondary | ICD-10-CM | POA: Insufficient documentation

## 2010-08-03 DIAGNOSIS — R6889 Other general symptoms and signs: Secondary | ICD-10-CM | POA: Insufficient documentation

## 2010-08-03 DIAGNOSIS — I509 Heart failure, unspecified: Secondary | ICD-10-CM | POA: Insufficient documentation

## 2010-08-03 DIAGNOSIS — N289 Disorder of kidney and ureter, unspecified: Secondary | ICD-10-CM | POA: Insufficient documentation

## 2010-08-03 DIAGNOSIS — Z7982 Long term (current) use of aspirin: Secondary | ICD-10-CM | POA: Insufficient documentation

## 2010-08-03 DIAGNOSIS — Z853 Personal history of malignant neoplasm of breast: Secondary | ICD-10-CM | POA: Insufficient documentation

## 2010-08-03 DIAGNOSIS — R079 Chest pain, unspecified: Secondary | ICD-10-CM | POA: Insufficient documentation

## 2010-08-03 LAB — DIFFERENTIAL
Basophils Absolute: 0 10*3/uL (ref 0.0–0.1)
Lymphocytes Relative: 22 % (ref 12–46)
Monocytes Absolute: 0.5 10*3/uL (ref 0.1–1.0)
Neutro Abs: 3.6 10*3/uL (ref 1.7–7.7)
Neutrophils Relative %: 61 % (ref 43–77)

## 2010-08-03 LAB — COMPREHENSIVE METABOLIC PANEL
ALT: 8 U/L (ref 0–35)
AST: 18 U/L (ref 0–37)
Albumin: 3.4 g/dL — ABNORMAL LOW (ref 3.5–5.2)
Calcium: 10.3 mg/dL (ref 8.4–10.5)
Creatinine, Ser: 1.37 mg/dL — ABNORMAL HIGH (ref 0.4–1.2)
Sodium: 140 mEq/L (ref 135–145)
Total Protein: 7.3 g/dL (ref 6.0–8.3)

## 2010-08-03 LAB — URINE MICROSCOPIC-ADD ON

## 2010-08-03 LAB — URINALYSIS, ROUTINE W REFLEX MICROSCOPIC
Glucose, UA: NEGATIVE mg/dL
Hgb urine dipstick: NEGATIVE
Protein, ur: NEGATIVE mg/dL
Specific Gravity, Urine: 1.01 (ref 1.005–1.030)
pH: 6.5 (ref 5.0–8.0)

## 2010-08-03 LAB — CBC
HCT: 34.6 % — ABNORMAL LOW (ref 36.0–46.0)
Hemoglobin: 11.3 g/dL — ABNORMAL LOW (ref 12.0–15.0)
MCHC: 32.7 g/dL (ref 30.0–36.0)
RBC: 3.67 MIL/uL — ABNORMAL LOW (ref 3.87–5.11)

## 2010-08-04 LAB — URINE CULTURE: Culture  Setup Time: 201206121838

## 2010-08-09 ENCOUNTER — Encounter: Payer: Medicare Other | Attending: Physical Medicine & Rehabilitation

## 2010-08-09 ENCOUNTER — Inpatient Hospital Stay: Payer: Medicare Other | Admitting: Physical Medicine & Rehabilitation

## 2010-08-10 DIAGNOSIS — Z0271 Encounter for disability determination: Secondary | ICD-10-CM

## 2010-08-16 NOTE — Discharge Summary (Signed)
NAME:  [name], [name]              ACCOUNT NO.:  [id number]  MEDICAL RECORD NO.:  [id number]           PATIENT TYPE:  I  LOCATION:  4733                         FACILITY:  [facility]  PHYSICIAN:  Gabrielle Dare. Janee Morn, M.D.DATE OF BIRTH:  05-Sep-1945  DATE OF ADMISSION:  07/11/2010 DATE OF DISCHARGE:  07/22/2010                        DISCHARGE SUMMARY - REFERRING   The patient is being discharged to skilled nursing.  ADMITTING TRAUMA SURGEON:  Adolph Pollack, MD  CONSULTANTS:  None.  DISCHARGE DIAGNOSES: 1. Fall from level ground. 2. Multiple left rib fractures 5 through 7. 3. Left pneumothorax requiring chest tube placement with subsequent     removal. 4. Polysubstance abuse with ongoing crack cocaine abuse. 5. Recent left cerebrovascular accident with admission in April and     subsequent inpatient rehabilitation.  The patient was just     discharged apparently a few days prior to her fall. 6. Recent myocardial infarction in March 2012. 7. Obstructive sleep apnea. 8. Previous right cerebrovascular accident. 9. Schizophrenia or schizoaffective disorder. 10.Dilated cardiomyopathy with estimated left ventricular ejection     fraction of 15% to 20%. 11.Ongoing tobacco abuse.  PROCEDURES:  Left chest tube for left pneumothorax by Dr. Abbey Chatters on admission on Jul 11, 2010.  HISTORY:  This is an unfortunate 65 year old African American female with multiple medical problems including a dilated cardiomyopathy, recent left-sided stroke, and recent MI, who had just been discharged from inpatient rehabilitation following rehab following her stroke when she fell at home.  She suffered multiple left rib fractures 5 through 7 and had a significant left pneumothorax.  We were asked to see the patient.  Dr. Abbey Chatters saw the patient on consultation and felt that she needed a left chest tube and this was placed.  Following her admission, she had significant pain issues initially  with her left rib fractures and pneumothorax, but this gradually improved over several days.  She was able to have her left chest tube removed on Jul 15, 2010, without difficulty.  Therapies were initiated and the patient was continuing to require a lot of assistance with her ambulation and self-care.  The family was unable to provide this level of assistance to the patient and the patient is being discharged to skilled nursing today.  Her dilated cardiomyopathy with history of diastolic heart failure appears to be well compensated currently on her usual medications.  She was restarted on her Plavix and aspirin quickly after her admission and has had no other untoward events during this hospitalization.  She was assessed by speech therapy and started back on a dysphagia II diet with thin liquids.  Again at this time, the patient is prepared for discharged to skilled nursing.  DIET:  Dysphagia II with thin liquids.  MEDICATIONS AT DISCHARGE: 1. Xanax 0.25 mg p.o. t.i.d. 2. Benadryl 25 mg q.6 h. p.r.n. itching. 3. Lasix 20 mg p.o. daily. 4. Hydrocortisone cream or lotion 1% applied back b.i.d. p.r.n. 5. Milk of magnesia p.r.n. 6. K-Dur 20 mEq p.o. b.i.d. 7. Ultram 100 mg p.o. q.6 h. p.r.n. pain. 8. Albuterol p.r.n. shortness of breath. 9. Baby aspirin 81 mg p.o. daily. 10.Tylenol  650 mg p.o. q.6 h. p.r.n. pain. 11.Carvedilol 3.125 mg 1 tablet b.i.d. with meals. 12.Thiamine 1 tablet p.o. daily. 13.Colace 100 mg p.o. b.i.d. 14.Ensure supplements t.i.d. p.r.n. for poor intake. 15.Folic acid 1 mg p.o. daily. 16.Isosorbide mononitrate 10 mg tablets q.6 h. 17.Lisinopril 10 mg p.o. daily 18.Multivitamin 1 p.o. daily. 19.Nitroglycerin sublingual 0.4 mg p.r.n. chest pain. 20.Omeprazole 20 mg p.o. q.a.m. 21.Plavix 75 mg p.o. daily. 22.Seroquel 50 mg p.o. at bedtime. 23.Simvastatin 10 mg p.o. at bedtime. 24.Spiriva 18 mcg 1 capsule inhaled daily. 25.Symbicort 80/4.5 mcg 2 puffs  b.i.d. 26.Ambien 5 mg p.o. at bedtime p.r.n. sleep. 27.Zyprexa 2.5 mg p.o. daily.  Please note the only change to her medication was the addition of Xanax for her anxiety and a decrease in her Lasix dose.  Followup should be with Dr. Wynn Banker.  This was already set up for August 09, 2010 at 11 a.m. Follow up with Dr. Pearlean Brownie in 6 weeks.  This appointment needs to be arranged and she needs follow up with Dr. Sharyn Lull for followup of her congestive failure within the next couple of weeks.  This appointment needs to be made for the patient.  If she is discharged from the skilled nursing, she needs ongoing followup with Psychiatry at apparently [facility] following her discharge.     Shawn Rayburn, P.A.   ______________________________ Gabrielle Dare Janee Morn, M.D.    SR/MEDQ  D:  07/22/2010  T:  07/22/2010  Job:  [id number]  cc:   Erick Colace, M.D. Eduardo Osier. Sharyn Lull, M.D. [facility] Pearlean Brownie, MD  Electronically Signed by Lazaro Arms P.A. on 08/10/2010 06:59:40 PM Electronically Signed by Violeta Gelinas M.D. on 08/16/2010 12:24:31 PM

## 2010-09-14 ENCOUNTER — Encounter: Payer: Medicare Other | Attending: Physical Medicine & Rehabilitation

## 2010-09-14 ENCOUNTER — Inpatient Hospital Stay: Payer: Medicare Other | Admitting: Physical Medicine & Rehabilitation

## 2010-09-20 IMAGING — CR DG CHEST 1V PORT
1 series · 1 of 1 positions shown · non-contrast
Comparison: Chest 10/07/2009.

CLINICAL DATA: Shortness of breath.

PORTABLE CHEST - 1 VIEW

[view not recorded]
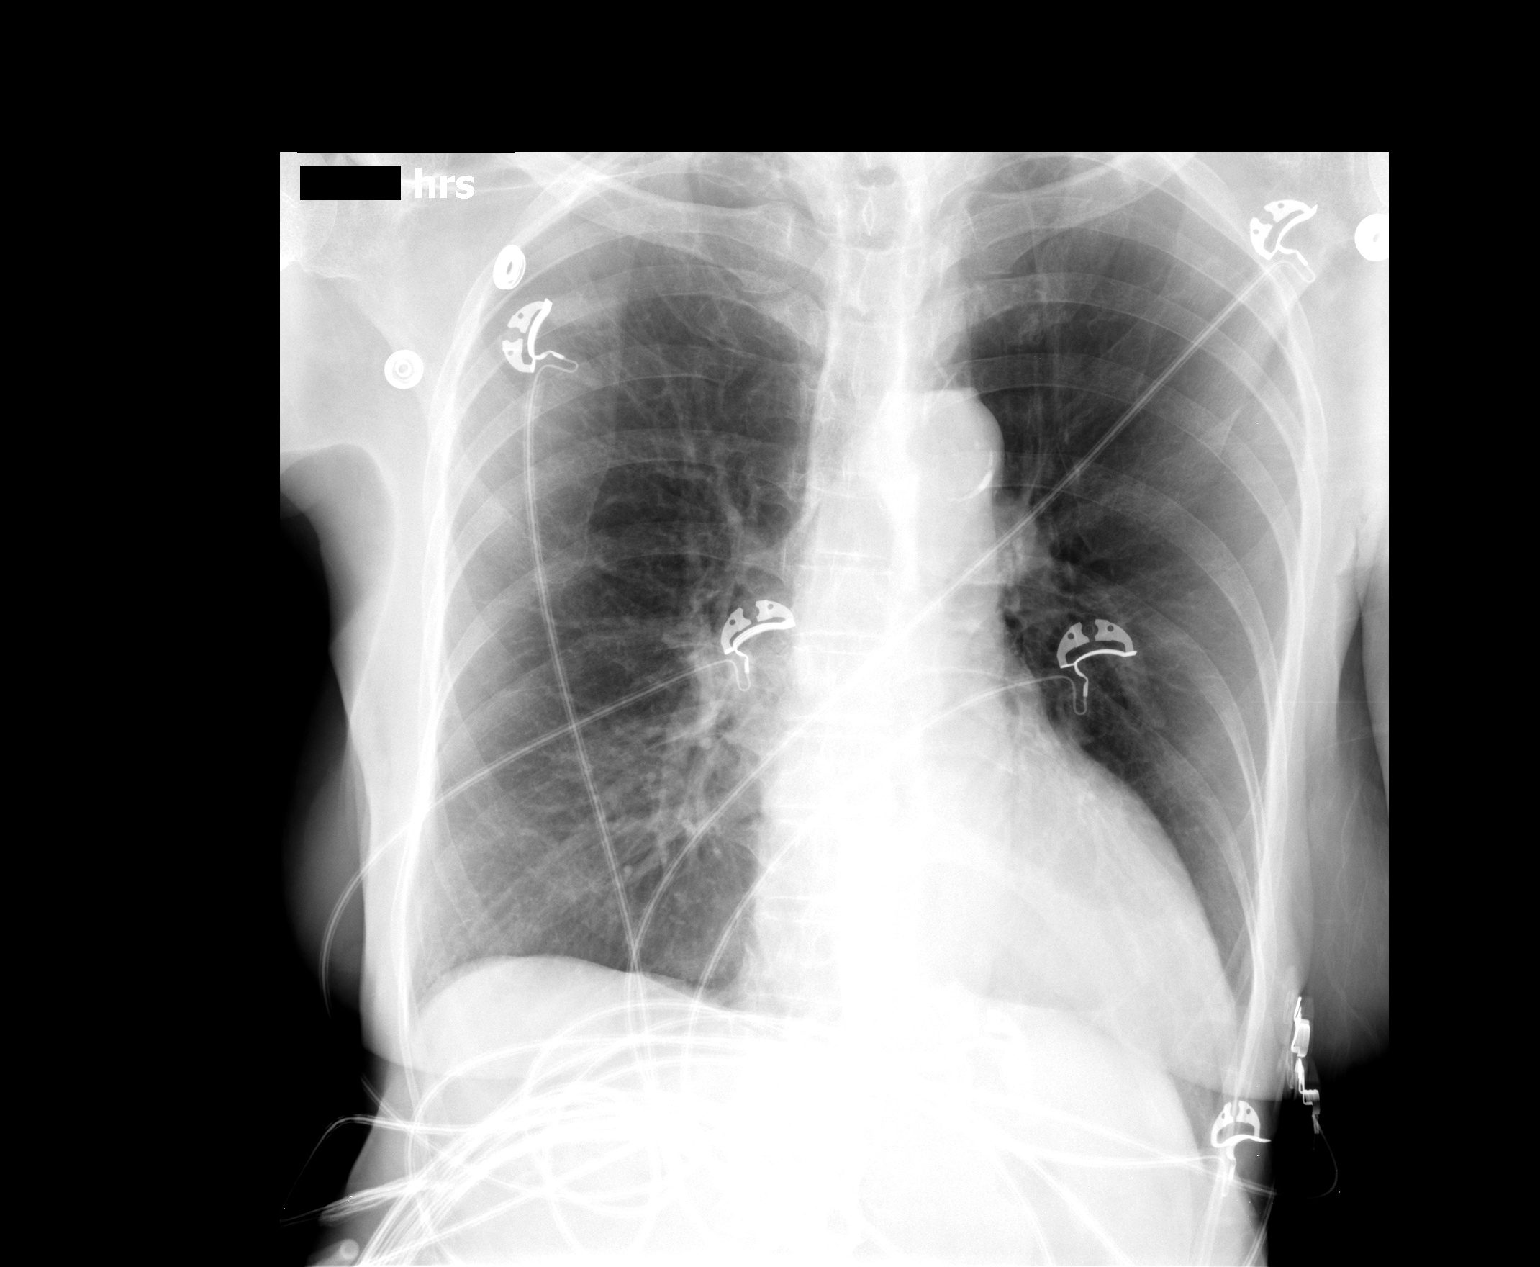

[1 of 1 positions shown; findings below may reference images not displayed]

FINDINGS: Chest is hyperexpanded but the lungs are clear.  Heart
size upper normal.  No effusion.
IMPRESSION: Emphysema without acute disease.

## 2010-11-15 ENCOUNTER — Other Ambulatory Visit: Payer: Self-pay | Admitting: Family Medicine

## 2010-11-15 ENCOUNTER — Other Ambulatory Visit (HOSPITAL_COMMUNITY)
Admission: RE | Admit: 2010-11-15 | Discharge: 2010-11-15 | Disposition: A | Discharge status: Home / Self Care | Payer: Medicare Other | Source: Ambulatory Visit | Attending: Family Medicine | Admitting: Family Medicine

## 2010-11-15 ENCOUNTER — Other Ambulatory Visit (HOSPITAL_COMMUNITY): Payer: Self-pay | Admitting: Family Medicine

## 2010-11-15 DIAGNOSIS — Z124 Encounter for screening for malignant neoplasm of cervix: Secondary | ICD-10-CM | POA: Insufficient documentation

## 2010-11-15 DIAGNOSIS — Z1231 Encounter for screening mammogram for malignant neoplasm of breast: Secondary | ICD-10-CM

## 2010-11-25 ENCOUNTER — Ambulatory Visit (HOSPITAL_COMMUNITY)
Admission: RE | Admit: 2010-11-25 | Discharge: 2010-11-25 | Disposition: A | Discharge status: Home / Self Care | Payer: Medicare Other | Source: Ambulatory Visit | Attending: Family Medicine | Admitting: Family Medicine

## 2010-11-25 ENCOUNTER — Ambulatory Visit (HOSPITAL_COMMUNITY): Payer: Medicare Other

## 2010-11-25 DIAGNOSIS — Z1231 Encounter for screening mammogram for malignant neoplasm of breast: Secondary | ICD-10-CM | POA: Insufficient documentation

## 2010-12-01 ENCOUNTER — Other Ambulatory Visit: Payer: Self-pay | Admitting: Family Medicine

## 2010-12-01 DIAGNOSIS — R928 Other abnormal and inconclusive findings on diagnostic imaging of breast: Secondary | ICD-10-CM

## 2010-12-03 IMAGING — CR DG CHEST 2V
2 series · 2 of 2 positions shown · non-contrast
Comparison: 11/23/2009

CLINICAL DATA: Shortness of breath

CHEST - 2 VIEW

[w chest lat]
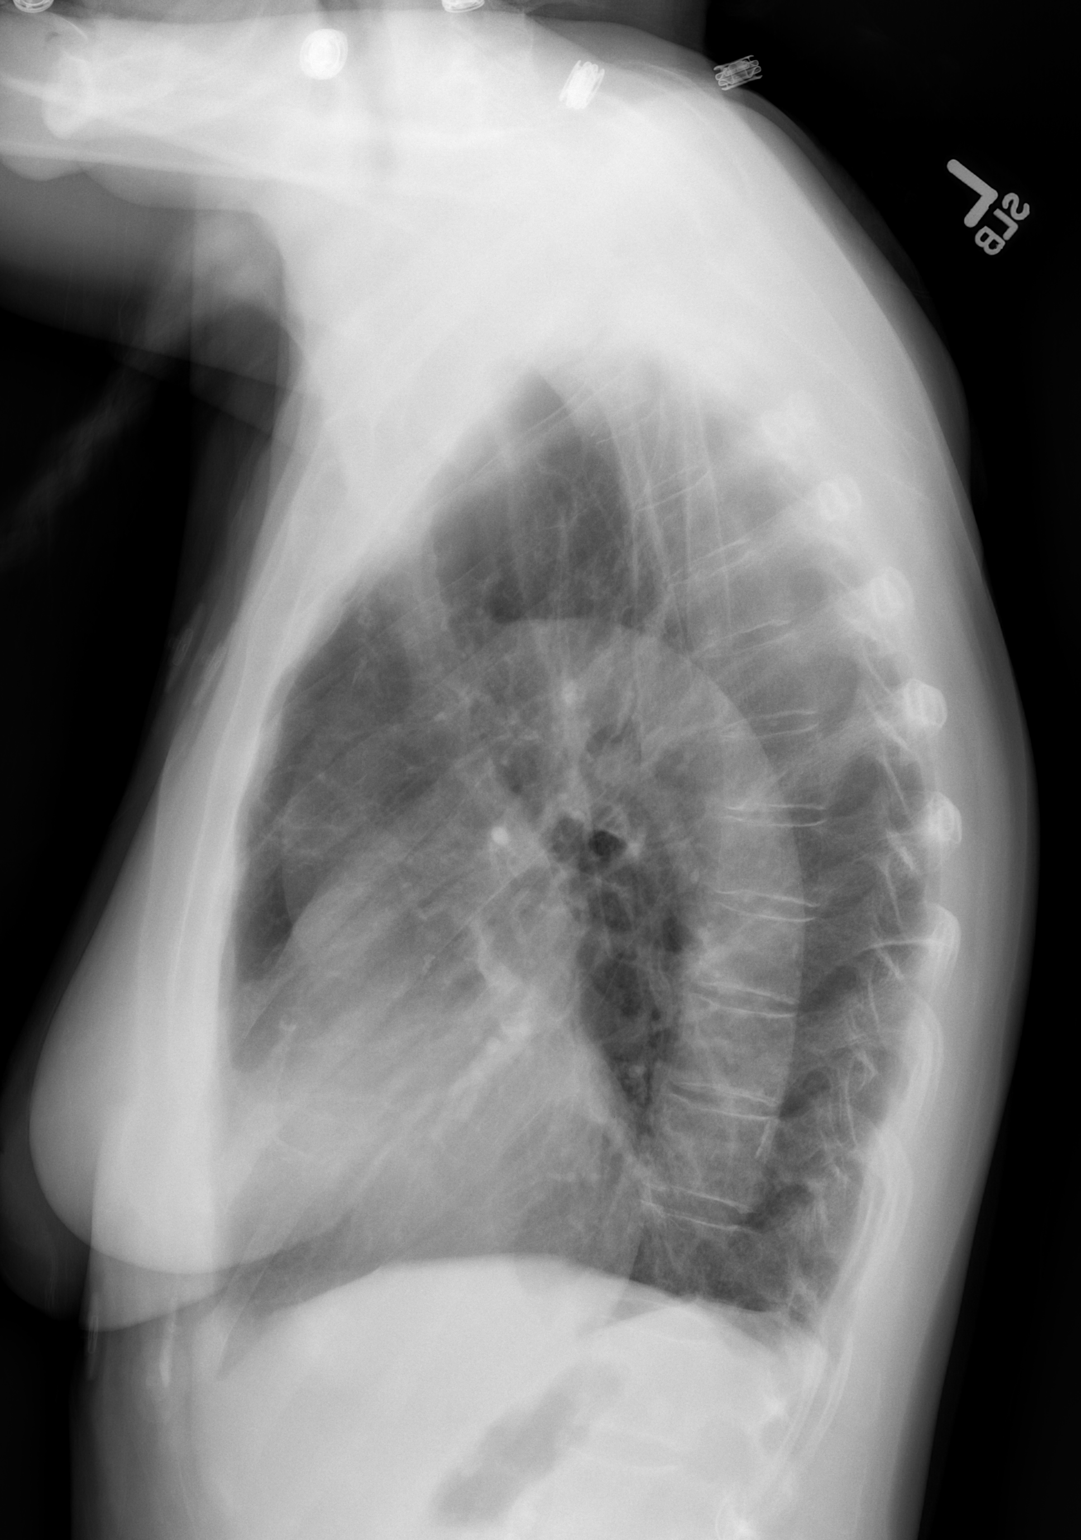

[w chest pa]
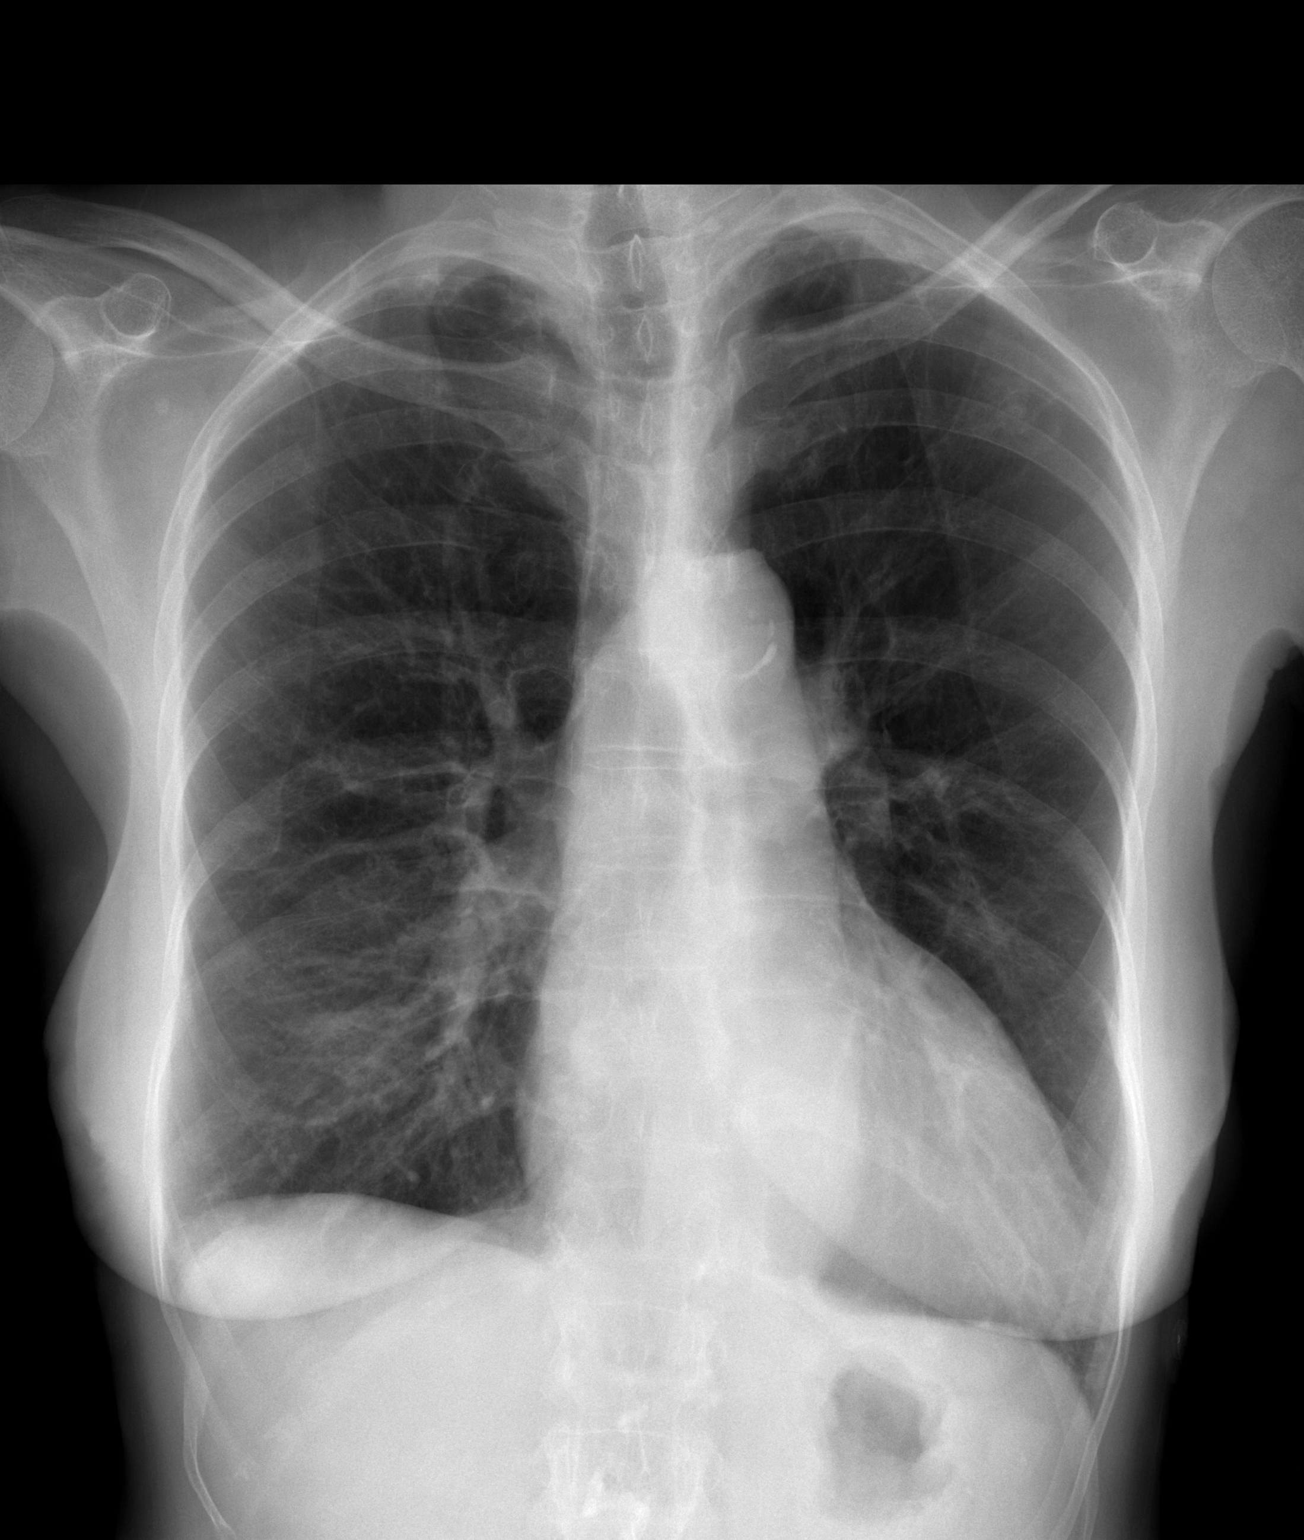

[2 of 2 positions shown; findings below may reference images not displayed]

FINDINGS: Mild hyperinflation suggests COPD/emphysema.  Chronically
prominent interstitial lung markings are again noted.  An EKG
electrode is noted over the left upper hemithorax, mimicking a
nodule.  Heart size is at the upper limits of normal.
Atherosclerotic aortic calcification noted.  Biapical pleural
thickening is present.
IMPRESSION: Hyperinflation suggesting COPD, no focal acute finding.

## 2010-12-28 ENCOUNTER — Inpatient Hospital Stay: Admission: RE | Admit: 2010-12-28 | Payer: Medicare Other | Source: Ambulatory Visit

## 2011-01-28 ENCOUNTER — Ambulatory Visit
Admission: RE | Admit: 2011-01-28 | Discharge: 2011-01-28 | Disposition: A | Discharge status: Home / Self Care | Payer: Medicare Other | Source: Ambulatory Visit | Attending: Family Medicine | Admitting: Family Medicine

## 2011-01-28 ENCOUNTER — Other Ambulatory Visit: Payer: Self-pay | Admitting: Family Medicine

## 2011-01-28 DIAGNOSIS — R928 Other abnormal and inconclusive findings on diagnostic imaging of breast: Secondary | ICD-10-CM

## 2011-01-28 DIAGNOSIS — C50919 Malignant neoplasm of unspecified site of unspecified female breast: Secondary | ICD-10-CM | POA: Insufficient documentation

## 2011-01-28 HISTORY — DX: Malignant neoplasm of unspecified site of unspecified female breast: C50.919

## 2011-02-10 ENCOUNTER — Encounter (INDEPENDENT_AMBULATORY_CARE_PROVIDER_SITE_OTHER): Payer: Self-pay | Admitting: Surgery

## 2011-02-10 ENCOUNTER — Ambulatory Visit (INDEPENDENT_AMBULATORY_CARE_PROVIDER_SITE_OTHER): Payer: Medicare Other | Admitting: Surgery

## 2011-02-10 VITALS — BP 130/76 | HR 68 | Temp 96.4°F | Resp 20 | Ht 61.0 in | Wt 99.6 lb

## 2011-02-10 DIAGNOSIS — C50919 Malignant neoplasm of unspecified site of unspecified female breast: Secondary | ICD-10-CM

## 2011-02-10 DIAGNOSIS — R1310 Dysphagia, unspecified: Secondary | ICD-10-CM | POA: Insufficient documentation

## 2011-02-10 NOTE — Patient Instructions (Signed)
Breast Cancer, Early Stage, Surgery Choices Breast cancer is the most common cancer, except for skin cancer. It is the second most common cause of death, except for lung cancer, in women. The risk of a woman developing breast cancer is 12.5%. Breast cancer in women younger than 65 years old is rare. Most of these cancer cases have spread to the breast from another part of the body (metastatic). Finding out that you have breast cancer is an emotional shock and is difficult to understand, because it is an overwhelming, serious, and life-threatening disease. You will need to make important decisions about how you want it treated.   As a woman with early-stage breast cancer (DCIS or Stage I, IIA, IIB, IIIA, or IV) you may be able to choose which type of breast surgery to have. Your caregiver will help you understand what stage you are in. Often, your choices are:  Removal of the cancerous part(s) of the breast (breast-sparing surgery).     Lumpectomy.    Partial/Segmental mastectomy.     Removal of the whole breast (simple mastectomy). Research shows that women with early-stage breast cancer who have breast-sparing surgery along with radiation therapy live as long as those who have a mastectomy. Most women with breast cancer will lead long, healthy lives after treatment.     Reconstructive surgery. This type of surgery is to make the breast and nipple area as normal looking as it was before the mastectomy. It is usually done by a plastic surgeon at the same time as the mastectomy. There are several types of reconstructive surgery that should be discussed with your caregiver and plastic surgeon.     Lymph node surgery.     Sentinel (removing those lymph nodes in the armpit that breast cancer spreads to first).     Axillary lymph node dissection (removing all the lymph nodes in the armpit).  TREATMENT Treatment for breast cancer usually begins a few weeks after diagnosis. In these weeks, you should  meet with a surgeon, learn the facts about your surgery choices, treatment options, get a second opinion, and think about what is important to you.   Treatment choices include:  Surgery.     Radiation.    Chemotherapy.    Medicines, hormones.     Reconstructive breast surgery.     Any combination of the above treatments.  Choose which kind of surgery to have. If radiation, chemotherapy, or hormone therapy is considered, this also should be discussed before the surgery. Radical breast surgery is rarely performed now. Usually, lumpectomy, partial/segmental mastectomy, simple mastectomy in combination with radiation, chemotherapy, and/or hormone treatment is recommended. Clinical trial protocols (specific treatment plan with surgery and radiation, chemotherapy, and hormones) for breast cancer have been put in place in hospitals, universities, medical schools, and cancer centers all over the country. These patients are followed for several years to find out what treatment gives the best results for the protocol given, for the different stages of breast cancer.   Most women want to make this choice with help from their caregiver. After all, the kind of surgery you have will affect how you look and feel. But it is often hard to decide what to do. The more information you have, the better the decision you can make.  Talk to a surgeon about your breast cancer surgery choices. Find out what happens during surgery, types of problems that sometimes occur, and other kinds of treatment (if any) you will need after surgery. Be sure   to ask a lot of questions and learn as much as you can. You may also wish to talk with family members, friends, or others who have had breast cancer surgery, radiation therapy, chemotherapy, or hormone therapy.     After talking with a surgeon, you may want a second opinion. This means talking with another doctor or surgeon who might tell you about other treatment options. They may  simply give you information that can help you feel better about the choice you are making. Do not worry about hurting your surgeon's feelings. It is common practice to get a second opinion, and some insurance companies require it. Plus, it is better to get a second opinion than to worry that you have made the wrong choice.  STAGES OF BREAST CANCER Staging of breast cancer depends on the size of the tumor, if and how far it has spread, lymph node involvement in the armpits, and whether it has spread to other parts of the body. If you are unsure of the stage of your cancer, ask your caregiver. The following are the stages of breast cancer:  Stage 0: This means you either have DCIS or LCIS.     DCIS (Ductal Carcinoma in Situ) is very early breast cancer. It is often too small to form a lump. Your caregiver may refer to DCIS as noninvasive cancer.     LCIS (Lobular Carcinoma in Situ) is not cancer, but it may increase the chance that you will get breast cancer. Talk with your caregiver about treatment options, if you are diagnosed with LCIS.     The 5 year survival rate in this stage is almost 100%.     Stage I:     Your cancer is less than 1 inch across (2 centimeters) or about the size of a quarter. The cancer is only in the breast. It has not spread to lymph nodes or other parts of your body.     Stage IIA:     No cancer is found in your breast. However, cancer is found in the lymph nodes under your arm, or     Your cancer is 1 inch (2 centimeters) or smaller. It has spread to 3 lymph nodes or less (the lymph nodes under your arm), or     Your cancer is about 1-2 inches (2-5 centimeters). It has not spread to the lymph nodes under your arm.     Stage IIB:   Your cancer is about 1-2 inches (2-5 centimeters). It has spread to the lymph nodes under your arm, or     Your cancer is larger than 2 inches (5 centimeters). It has not spread to the lymph nodes under your arm.     The 5 year  survival rate is 85 to 90%.     Stage IIIA:   No cancer is found in the breast. It is found in lymph nodes under your arm. The lymph nodes are attached to each other, or     Your cancer is 2 inches (5 centimeters) or smaller. It has spread to lymph nodes under your arm. The lymph nodes are attached to each other, or     Your cancer is larger than 2 inches (5 centimeters) and has spread to lymph nodes under your arm, and they are not attached to each other.     Your cancer is smaller than 2 inches (5 centimeters) and has spread to lymph nodes above your collarbone.     Inflammatory cancer   of the breast (red rash, tender and swollen breasts) is in the stage III category.     The 5 year survival rate is 45 to 67%.     Stage IV:     Cancer cells have spread to other parts of the body.     The 5 year survival rate is about 20%.  ABOUT LYMPH NODES  Lymph nodes are part of your body's immune system, which helps fight infection and disease. Lymph nodes are small, round, and clustered (like a bunch of grapes) throughout your body.     Axillary lymph nodes are in the area under your arm. Breast cancer may spread to these lymph nodes first, even when the tumor in the breast is small. This is why most surgeons take out some of these lymph nodes at the time of breast surgery.     Lymphedema is swelling caused by a buildup of lymph fluid. You may have this type of swelling in your arm, if your lymph nodes are taken out with surgery or damaged by radiation therapy.     Lymphedema can show up soon after surgery. The symptoms are often mild and last for a short time.     Lymphedema can show up months or even years after cancer treatment is over. Often, lymphedema develops after an insect bite, minor injury, or burn on the arm where your lymph nodes were removed. Sometimes, this can be painful. One way to reduce the swelling is to work with a caregiver who specializes in rehabilitation, or with a  physical therapist.     Sentinel lymph node biopsy is surgery to remove as few lymph nodes as possible from under the arm. The surgeon first injects a dye in the breast to see which lymph nodes the breast tumor drains into. Then, he or she removes these nodes to see if they have any cancer. If there is no cancer, the surgeon may leave the other lymph nodes in place. This surgery is fairly new and is being studied experimentally. Talk with your surgeon if you want to learn more.  COMPARE YOUR CHOICES   BREAST-SPARING SURGERY  Is this surgery right for me?   Breast-sparing surgery with radiation is a safe choice for most women who have early-stage breast cancer. This means that your cancer is DCIS or at Stage I, IIA, IIB, or IIIA.  What are the names of the different kinds of surgery?  Lumpectomy.     Partial mastectomy.     Breast-sparing surgery.     Segmental mastectomy.     Simple mastectomy.     Sentinel lymph node removal.     Axillary lymph node excision.  What doctors am I likely to see?  Oncologist.     Surgeon.    Radiation Oncologist.     Chemotherapy Oncologist.  What will my breast look like after surgery?  Your breast should look a lot like it did before surgery. But if your tumor is large, your breast may look different or smaller after breast-sparing surgery.  Will I have feeling in the area around my breast?  Yes. You should still have feeling in your breast, nipple, and areola (dark area around your nipple).  Will I have pain after the surgery?  You may have pain after surgery. Talk with your caregiver about ways to control this pain.  What other problems can I expect?  You may feel very tired after radiation therapy. You may get swelling in your arm (  lymphedema).  Will I need more surgery?  Maybe. You may need more surgery to remove lymph nodes from under your arm. Also, if the surgeon does not remove all your cancer the first time, you may need more  surgery.  What other types of treatment will I need?  You may need radiation therapy, given almost every day for 5 to 8 weeks. You may also need chemotherapy, hormone therapy, or both.  Will insurance pay for my surgery?  Check with your insurance company to find out how much it pays for breast cancer surgery and other needed treatments.  Will the type of surgery I have affect how long I live?  Women with early-stage breast cancer who have breast-sparing surgery followed by radiation live just as long as women who have a mastectomy. Most women with breast cancer will lead long, healthy lives after treatment.  What are the chances that my cancer will come back after surgery?  About 10% of women who have breast-sparing surgery along with radiation therapy get cancer in the same breast within 12 years. If this happens, you will need a mastectomy, but it will not affect how long you live.  MASTECTOMY SURGERY Is this surgery right for me?   Mastectomy is a safe choice for women who have early-stage breast cancer (DCIS, Stage I, IIA, IIB, or IIIA). You may need a mastectomy if:     You have small breasts and a large tumor.     You have cancer in more than one part of your breast.     The tumor is under the nipple.     You do not have access to radiation therapy.  What are the names of the different kinds of surgery?  Total mastectomy.     Modified radical mastectomy (not usually done now).     Double mastectomy.     Simple mastectomy.  What doctors am I likely to see?  Oncologist.     Surgeon.    Radiation Oncologist.     Chemotherapy Oncologist.  What will my breast look like after surgery?  Your breast and nipple will be removed. You will have a flat chest on the side of your body where the breast was removed.  Will I have feeling in the area around my breast?  Maybe. After surgery, you will have no feeling (numb) in your chest wall and maybe also under your arm. This numb  feeling should go away in 1-2 years, but it will never feel like it did before. Also, the skin where your breast was may feel tight.  Will I have pain after the surgery?  You may have pain after surgery. Talk with your caregiver about ways to control this pain.  What other problems can I expect?  You may have pain in your neck or back. You may feel out of balance, if you had large breasts and do not have reconstruction surgery. You may get swelling in your arm (lymphedema).  Will I need more surgery?  Maybe. You may need surgery to remove lymph nodes from under your arm. Also, if you have problems after your mastectomy, you may need to see your surgeon for treatment.  What other types of treatment will I need?  You may also need chemotherapy, hormone therapy, or radiation therapy. Some women get all 3 types of therapy or any combination of the 3.  Will insurance pay for my surgery?  Check with your insurance company to find out how much   it pays for breast cancer surgery and other needed treatments.  Will the type of surgery I have affect how long I live?  Women with early-stage breast cancer who have a mastectomy live as long as women who have breast-sparing surgery followed by radiation therapy. Most women with breast cancer will lead long, healthy lives after treatment.  What are the chances that my cancer will come back after surgery?  About 5% of women who have a mastectomy will get cancer on the same side of their chest within 12 years.  MASTECTOMY AND BREAST RECONSTRUCTION SURGERY  Is this surgery right for me?  If you have a mastectomy, you might also want breast reconstruction surgery.     You can choose to have reconstruction surgery at the same time as your mastectomy.     You can wait and have it at a later date.  What are the names of the different kinds of surgery?  Breast implant.     Tissue flap surgery.     Reconstructive plastic surgery.  What doctors am I likely  to see?  Oncologist.     Surgeon.    Radiation Oncologist.     Reconstructive Plastic Surgeon.     Chemotherapy Oncologist.  What will my breast look like after surgery?  Although you will have a breast-like shape, your breast will not look the same as it did before surgery.  Will I have feeling in the area around my breast?  No. The area around your breast will always be numb (have no feeling).  Will I have pain after the surgery?  You are likely to have pain after major surgery, such as mastectomy and reconstruction surgery. There are many ways to deal with pain. Let your caregiver know if you need relief from pain.  What other problems can I expect?  It may take you many weeks or even months to recover from breast reconstruction surgery. If you have an implant, you may get infections, pain, or hardness. Also, you may not like how your breast-like shape looks. You may need more surgery if your implant breaks or leaks. If you have tissue flap surgery, you may lose strength in the part of your body where the flap came from. You may get swelling in your arm (lymphedema).  Will I need more surgery?  Yes. You will need surgery at least 2 more times to build a new breast-like shape. With implants, you may need more surgery months or years later. You may also need surgery to remove lymph nodes from under your arm.  What other types of treatment will I need?  You may need chemotherapy, hormone therapy, or radiation therapy. Some women get all 3 types of therapy or any combination of the 3.  Will insurance pay for my surgery?  Check with your insurance company to find out if it pays for breast reconstruction surgery. You should also ask if your insurance will pay for problems that may result from breast reconstruction surgery.  Will the type of surgery I have affect how long I live?  Women with early-stage breast cancer who have a mastectomy live the same amount of time as women who have  breast-sparing surgery followed by radiation therapy. Most women with breast cancer will lead long, healthy lives after treatment.  What are the chances that my cancer will come back after surgery?  About 5% of women who have a mastectomy will get cancer on the same side of their chest within   12 years. Breast reconstruction surgery does not affect the chances of your cancer coming back.  Where can I learn more about coping with life after cancer? To learn more about life after cancer, you might want to read "Facing Forward: Life After Cancer Treatment." You can get this booklet at www.cancer.gov/publications or by calling 1-800-4-CANCER. THINK ABOUT WHAT IS IMPORTANT TO YOU    After you have talked with your surgeon and learned the facts, you may also want to talk with your spouse or partner, family, friends, or other women who have had breast cancer surgery. The more you know about breast cancer, the treatment, and what happens afterward, the more informed you will be and the easier it will be for you to make good decisions that are important to you.     Think about what is important to you. Here are some questions to think about:     Do I want to get a second opinion?     How important is it to me how my breast looks after cancer surgery?     How important is it to me how my breast feels after cancer surgery?     If I have breast-sparing surgery or more extensive surgery, am I willing to get radiation, hormone and/or chemotherapy?     If I have a mastectomy, do I also want breast reconstruction surgery?     If I have breast reconstruction surgery, do I want it at the same time as my mastectomy?     What treatment does my insurance cover, and what do I need to pay for?     Who would I like to talk with about my surgery, radiation, hormone, and chemotherapy choices?     What else do I want to know, do, or learn before I make my choice about breast cancer surgery?     After I have  learned all I could and have talked with my surgeon, I will make a choice that feels right for me.     A patient who takes the time to be well-informed will feel more comfortable about her decisions regarding surgery, and will feel better about the procedure following the surgery.     Coping and Support:     Be open and willing to talk to your family and friends about your cancer. Family and friends can be your best support group, to help you work through your concerns and worries.     Discussing your thoughts, concerns, and intimacy issues with your spouse or partner is very important and helpful.     Join support groups to share and learn how others with breast cancer cope and deal with their cancer. The [employer] can help you find support groups in your area.     Breast cancer may affect your confidence and feelings about being a total woman. It may interfere with your intimate relationship with your partner. Counseling may be necessary to help you overcome these feelings.     Talk to a counselor, your clergyperson, psychologist, or psychiatrist.     Talk to a medical social worker, if you have financial questions or problems.     Do not be afraid to ask for help, especially during your treatment.     Learn to be as independent as possible, as soon as possible.  Document Released: 04/30/2003 Document Revised: 10/20/2010 Document Reviewed: 01/05/2009 [employer] Patient Information 2012 [employer] 

## 2011-02-10 NOTE — Progress Notes (Signed)
Patient ID: [name], female   DOB: April 15, 1945, 65 y.o.   MRN: [id number]  Chief Complaint  Patient presents with  . Other    Eval left breast cancer    HPI [name] is a 65 y.o. female.  HPI The patient presents at the request of her breast center of [facility] due to left breast cancer. This was detected on routine screening mammography. The patient is a poor historian do to multiple medical and psychiatric problems. It is unclear how long the mass has been there. The patient denies any breast symptoms except pain in both breasts. She is not specific when questioned about pelvic wall which, duration or exacerbating factors.  Past Medical History  Diagnosis Date  . Breast cancer   . Weight loss, unintentional   . Trouble swallowing   . Change in voice   . Hypertension   . Substance abuse   . Heart attack   . Dementia     Past Surgical History  Procedure Date  . Total abdominal hysterectomy age 67    Family History  Problem Relation Age of Onset  . Breast cancer Mother   . Cancer Mother   . Birth defects Mother     breast  . Heart disease Father     heart attack  . Heart attack Brother     Social History History  Substance Use Topics  . Smoking status: Former Smoker    Types: Cigarettes    Quit date: 02/22/2008  . Smokeless tobacco: Never Used   Comment: started smoking at age 52  . Alcohol Use: Yes     social use    Allergies  Allergen Reactions  . Codeine Itching    All over the body  . Penicillins Hives and Other (See Comments)    "Whelps" per patient.    Current Outpatient Prescriptions  Medication Sig Dispense Refill  . albuterol (PROVENTIL) (2.5 MG/3ML) 0.083% nebulizer solution Take 2.5 mg by nebulization every 6 (six) hours as needed.        Marland Kitchen albuterol (PROVENTIL) 90 MCG/ACT inhaler Inhale 2 puffs into the lungs every 6 (six) hours as needed.        Marland Kitchen aspirin 81 MG tablet Take 81 mg by mouth daily.        . budesonide-formoterol  (SYMBICORT) 80-4.5 MCG/ACT inhaler Inhale 2 puffs into the lungs 2 (two) times daily.        . carvedilol (COREG) 3.125 MG tablet Take 3.125 mg by mouth 2 (two) times daily with a meal.        . clopidogrel (PLAVIX) 75 MG tablet Take 75 mg by mouth daily.        Marland Kitchen docusate sodium (COLACE) 100 MG capsule Take 100 mg by mouth 2 (two) times daily.        . furosemide (LASIX) 40 MG tablet Take 40 mg by mouth daily.        . isosorbide mononitrate (IMDUR) 30 MG 24 hr tablet Take 30 mg by mouth daily.        Marland Kitchen lisinopril (PRINIVIL,ZESTRIL) 10 MG tablet Take 10 mg by mouth daily.        . nitroGLYCERIN (NITROSTAT) 0.4 MG SL tablet Place 0.4 mg under the tongue every 5 (five) minutes as needed.        Marland Kitchen OLANZapine (ZYPREXA) 2.5 MG tablet Take 2.5 mg by mouth at bedtime.        Marland Kitchen omeprazole (PRILOSEC) 20 MG capsule Take  20 mg by mouth daily.        . QUEtiapine (SEROQUEL) 50 MG tablet Take 50 mg by mouth at bedtime.        . simvastatin (ZOCOR) 10 MG tablet Take 10 mg by mouth at bedtime.        Marland Kitchen tiotropium (SPIRIVA) 18 MCG inhalation capsule Place 18 mcg into inhaler and inhale daily.        . traZODone (DESYREL) 50 MG tablet Take 50 mg by mouth at bedtime.          Review of Systems Review of Systems  Constitutional: Positive for fatigue. Negative for activity change.  HENT: Negative.   Eyes: Negative.   Respiratory: Positive for cough and wheezing.   Cardiovascular: Negative.   Gastrointestinal: Negative.   Genitourinary: Negative.   Musculoskeletal: Positive for arthralgias and gait problem.  Skin: Negative.   Neurological: Positive for tremors, speech difficulty and weakness.  Hematological: Negative.   Psychiatric/Behavioral: Positive for confusion and dysphoric mood. The patient is nervous/anxious.     Blood pressure 130/76, pulse 68, temperature 96.4 F (35.8 C), temperature source Temporal, resp. rate 20, height 5\' 1"  (1.549 m), weight 99 lb 9.6 oz (45.178 kg).  Physical  Exam Physical Exam  Constitutional: She appears well-developed and well-nourished.  HENT:  Head: Normocephalic and atraumatic.  Eyes: EOM are normal. Pupils are equal, round, and reactive to light.  Neck: Normal range of motion. Neck supple.  Cardiovascular: Normal rate and regular rhythm.   Pulmonary/Chest: Effort normal and breath sounds normal.       Mass left breast upper outer quadrant 2 cm in maximal diameter freely mobile. No left axillary adenopathy. Right breast normal. Right axilla normal  Lymphadenopathy:    She has no cervical adenopathy.  Neurological: She is alert.  Psychiatric: Her mood appears anxious. Her affect is blunt. Her speech is delayed. She is slowed and withdrawn. She exhibits a depressed mood. She is noncommunicative. She is inattentive.    Data Reviewed Breast mammography and ultrasound of left breast shows a mass 2.2 cm in maximal diameter upper outer quadrant core biopsy proven to be invasive mammary carcinoma ER positive PR positive HER-2/neu negative with a proliferation rate of 15%  Assessment    Stage II left breast cancer    Plan    I discussed the patient's care with the patient and 2 sisters with her today. It's unclear to me if she can comprehend her treatment options. I will report to medical and radiation oncology to discuss there role in the treatment of her breast cancer. She would be a good breast conserving patient.       Pius Byrom A. 02/10/2011, 3:08 PM

## 2011-02-11 ENCOUNTER — Telehealth: Payer: Self-pay | Admitting: *Deleted

## 2011-02-11 NOTE — Telephone Encounter (Signed)
Confirmed 02/14/11 appt w/ pt's sister Hale Bogus.  Unable to mail letter & packet - gave verbal.

## 2011-02-14 ENCOUNTER — Telehealth: Payer: Self-pay | Admitting: *Deleted

## 2011-02-14 ENCOUNTER — Other Ambulatory Visit: Payer: Self-pay | Admitting: *Deleted

## 2011-02-14 ENCOUNTER — Other Ambulatory Visit: Payer: Medicare Other

## 2011-02-14 ENCOUNTER — Ambulatory Visit: Payer: Medicare Other

## 2011-02-14 ENCOUNTER — Ambulatory Visit: Payer: Medicare Other | Admitting: Oncology

## 2011-02-14 DIAGNOSIS — C50919 Malignant neoplasm of unspecified site of unspecified female breast: Secondary | ICD-10-CM

## 2011-02-14 NOTE — Telephone Encounter (Signed)
Pt's sister Poter called stating that Alayshia forgot about her appt and she needed to reschedule.  I confirmed 03/01/11 appt w/ Hale Bogus.  Mailed before appt letter to [employer].

## 2011-02-16 ENCOUNTER — Inpatient Hospital Stay (HOSPITAL_COMMUNITY)
Admission: EM | Admit: 2011-02-16 | Discharge: 2011-02-19 | DRG: 191 | Disposition: A | Discharge status: Home / Self Care | Payer: Medicare Other | Attending: Internal Medicine | Admitting: Internal Medicine

## 2011-02-16 ENCOUNTER — Emergency Department (HOSPITAL_COMMUNITY): Payer: Medicare Other

## 2011-02-16 ENCOUNTER — Encounter (HOSPITAL_COMMUNITY): Payer: Self-pay | Admitting: *Deleted

## 2011-02-16 DIAGNOSIS — J45909 Unspecified asthma, uncomplicated: Secondary | ICD-10-CM

## 2011-02-16 DIAGNOSIS — Z853 Personal history of malignant neoplasm of breast: Secondary | ICD-10-CM

## 2011-02-16 DIAGNOSIS — E785 Hyperlipidemia, unspecified: Secondary | ICD-10-CM | POA: Diagnosis present

## 2011-02-16 DIAGNOSIS — Z7902 Long term (current) use of antithrombotics/antiplatelets: Secondary | ICD-10-CM

## 2011-02-16 DIAGNOSIS — K5900 Constipation, unspecified: Secondary | ICD-10-CM

## 2011-02-16 DIAGNOSIS — C50919 Malignant neoplasm of unspecified site of unspecified female breast: Secondary | ICD-10-CM

## 2011-02-16 DIAGNOSIS — J9610 Chronic respiratory failure, unspecified whether with hypoxia or hypercapnia: Secondary | ICD-10-CM | POA: Diagnosis present

## 2011-02-16 DIAGNOSIS — I219 Acute myocardial infarction, unspecified: Secondary | ICD-10-CM

## 2011-02-16 DIAGNOSIS — I728 Aneurysm of other specified arteries: Secondary | ICD-10-CM

## 2011-02-16 DIAGNOSIS — J449 Chronic obstructive pulmonary disease, unspecified: Secondary | ICD-10-CM | POA: Diagnosis present

## 2011-02-16 DIAGNOSIS — I10 Essential (primary) hypertension: Secondary | ICD-10-CM

## 2011-02-16 DIAGNOSIS — Z8673 Personal history of transient ischemic attack (TIA), and cerebral infarction without residual deficits: Secondary | ICD-10-CM

## 2011-02-16 DIAGNOSIS — Z87891 Personal history of nicotine dependence: Secondary | ICD-10-CM

## 2011-02-16 DIAGNOSIS — R0602 Shortness of breath: Secondary | ICD-10-CM

## 2011-02-16 DIAGNOSIS — K8020 Calculus of gallbladder without cholecystitis without obstruction: Secondary | ICD-10-CM

## 2011-02-16 DIAGNOSIS — J441 Chronic obstructive pulmonary disease with (acute) exacerbation: Principal | ICD-10-CM | POA: Diagnosis present

## 2011-02-16 DIAGNOSIS — Z8639 Personal history of other endocrine, nutritional and metabolic disease: Secondary | ICD-10-CM

## 2011-02-16 DIAGNOSIS — N179 Acute kidney failure, unspecified: Secondary | ICD-10-CM

## 2011-02-16 DIAGNOSIS — Z7982 Long term (current) use of aspirin: Secondary | ICD-10-CM

## 2011-02-16 DIAGNOSIS — Z888 Allergy status to other drugs, medicaments and biological substances status: Secondary | ICD-10-CM

## 2011-02-16 DIAGNOSIS — I5022 Chronic systolic (congestive) heart failure: Secondary | ICD-10-CM | POA: Diagnosis present

## 2011-02-16 DIAGNOSIS — I6340 Cerebral infarction due to embolism of unspecified cerebral artery: Secondary | ICD-10-CM

## 2011-02-16 DIAGNOSIS — R1310 Dysphagia, unspecified: Secondary | ICD-10-CM

## 2011-02-16 DIAGNOSIS — F0390 Unspecified dementia without behavioral disturbance: Secondary | ICD-10-CM | POA: Diagnosis present

## 2011-02-16 DIAGNOSIS — F209 Schizophrenia, unspecified: Secondary | ICD-10-CM | POA: Diagnosis present

## 2011-02-16 DIAGNOSIS — I252 Old myocardial infarction: Secondary | ICD-10-CM

## 2011-02-16 DIAGNOSIS — J4489 Other specified chronic obstructive pulmonary disease: Secondary | ICD-10-CM | POA: Diagnosis present

## 2011-02-16 DIAGNOSIS — I509 Heart failure, unspecified: Secondary | ICD-10-CM | POA: Diagnosis present

## 2011-02-16 DIAGNOSIS — N182 Chronic kidney disease, stage 2 (mild): Secondary | ICD-10-CM | POA: Diagnosis present

## 2011-02-16 DIAGNOSIS — Z8249 Family history of ischemic heart disease and other diseases of the circulatory system: Secondary | ICD-10-CM

## 2011-02-16 DIAGNOSIS — I129 Hypertensive chronic kidney disease with stage 1 through stage 4 chronic kidney disease, or unspecified chronic kidney disease: Secondary | ICD-10-CM | POA: Diagnosis present

## 2011-02-16 DIAGNOSIS — Z9861 Coronary angioplasty status: Secondary | ICD-10-CM

## 2011-02-16 DIAGNOSIS — I2510 Atherosclerotic heart disease of native coronary artery without angina pectoris: Secondary | ICD-10-CM | POA: Diagnosis present

## 2011-02-16 DIAGNOSIS — Z880 Allergy status to penicillin: Secondary | ICD-10-CM

## 2011-02-16 DIAGNOSIS — F1410 Cocaine abuse, uncomplicated: Secondary | ICD-10-CM | POA: Diagnosis present

## 2011-02-16 DIAGNOSIS — F411 Generalized anxiety disorder: Secondary | ICD-10-CM

## 2011-02-16 DIAGNOSIS — Z803 Family history of malignant neoplasm of breast: Secondary | ICD-10-CM

## 2011-02-16 DIAGNOSIS — M2550 Pain in unspecified joint: Secondary | ICD-10-CM

## 2011-02-16 DIAGNOSIS — Z79899 Other long term (current) drug therapy: Secondary | ICD-10-CM

## 2011-02-16 HISTORY — DX: Shortness of breath: R06.02

## 2011-02-16 HISTORY — DX: Heart failure, unspecified: I50.9

## 2011-02-16 HISTORY — DX: Unspecified convulsions: R56.9

## 2011-02-16 HISTORY — DX: Headache: R51

## 2011-02-16 MED ORDER — IPRATROPIUM BROMIDE 0.02 % IN SOLN
0.5000 mg | Freq: Once | RESPIRATORY_TRACT | Status: AC
  Administered 2011-02-16: 0.5 mg via RESPIRATORY_TRACT
  Filled 2011-02-16: qty 2.5

## 2011-02-16 MED ORDER — ALBUTEROL (5 MG/ML) CONTINUOUS INHALATION SOLN
10.0000 mg/h | INHALATION_SOLUTION | Freq: Once | RESPIRATORY_TRACT | Status: AC
  Administered 2011-02-17: 10 mg/h via RESPIRATORY_TRACT
  Filled 2011-02-16: qty 20

## 2011-02-16 MED ORDER — ALBUTEROL SULFATE (5 MG/ML) 0.5% IN NEBU
INHALATION_SOLUTION | RESPIRATORY_TRACT | Status: AC
  Administered 2011-02-16: 2.5 mg via RESPIRATORY_TRACT
  Filled 2011-02-16: qty 0.5

## 2011-02-16 MED ORDER — MAGNESIUM SULFATE 40 MG/ML IJ SOLN
2.0000 g | INTRAMUSCULAR | Status: AC
  Administered 2011-02-16: 2 g via INTRAVENOUS
  Filled 2011-02-16: qty 50

## 2011-02-16 MED ORDER — METHYLPREDNISOLONE SODIUM SUCC 125 MG IJ SOLR
125.0000 mg | Freq: Once | INTRAMUSCULAR | Status: AC
  Administered 2011-02-16: 125 mg via INTRAVENOUS
  Filled 2011-02-16: qty 2

## 2011-02-16 NOTE — ED Provider Notes (Signed)
History     CSN: [id number]  Arrival date & time 02/16/11  2331   First MD Initiated Contact with Patient 02/16/11 2328      Chief Complaint  Patient presents with  . Shortness of Breath    (Consider location/radiation/quality/duration/timing/severity/associated sxs/prior treatment) HPI  65yoF history of COPD, diastolic heart failure, coronary artery disease status post stent on Plavix, cocaine abuse recent diagnosis of breast cancer presents with shortness of breath. Patient is here with her niece. Pertinent history is taken from her niece and patient is short of breath. Patient with sudden onset shortness of breath which began approximately 45 minutes prior to arrival. She had wheezing noted as well. She had one nebulizer at home had no relief so her niece transported her to the emergency department. She denies recent fevers, chills, cough. She denies recent increased orthopnea, paroxysmal nocturnal dyspnea, leg swelling. She states that she was recently diagnosed with breast cancer approximately one week ago and has not been treatment for the same. Denies recent cocaine use. Patient is stooling but denies tongue swelling, throat swelling.    ED Notes, ED Provider Notes from 02/16/11 0000 to 02/16/11 23:25:56       Christa West Carbo, RN 02/16/2011 23:24      Pt c/o SOB for the past 45 min. No CP, denies fevers/chills/cough    Past Medical History  Diagnosis Date  . Breast cancer   . Weight loss, unintentional   . Trouble swallowing   . Change in voice   . Hypertension   . Substance abuse   . Heart attack   . Dementia   . Asthma     Past Surgical History  Procedure Date  . Total abdominal hysterectomy age 25    Family History  Problem Relation Age of Onset  . Breast cancer Mother   . Cancer Mother   . Birth defects Mother     breast  . Heart disease Father     heart attack  . Heart attack Brother     History  Substance Use Topics  . Smoking status:  Former Smoker    Types: Cigarettes    Quit date: 02/22/2008  . Smokeless tobacco: Never Used   Comment: started smoking at age 64  . Alcohol Use: Yes     social use    OB History    Grav Para Term Preterm Abortions TAB SAB Ect Mult Living                  Review of Systems  All other systems reviewed and are negative.  except as noted HPI   Allergies  Codeine and Penicillins  Home Medications   Current Outpatient Rx  Name Route Sig Dispense Refill  . ALBUTEROL SULFATE (2.5 MG/3ML) 0.083% IN NEBU Nebulization Take 2.5 mg by nebulization every 6 (six) hours as needed. For wheezing    . ALBUTEROL 90 MCG/ACT IN AERS Inhalation Inhale 2 puffs into the lungs every 6 (six) hours as needed. For wheezing     . ASPIRIN 81 MG PO TABS Oral Take 81 mg by mouth daily.      Marland Kitchen CARVEDILOL 3.125 MG PO TABS Oral Take 3.125 mg by mouth 2 (two) times daily with a meal.      . CLOPIDOGREL BISULFATE 75 MG PO TABS Oral Take 75 mg by mouth daily.      Marland Kitchen DOCUSATE SODIUM 100 MG PO CAPS Oral Take 100 mg by mouth 2 (two) times daily.      Marland Kitchen  FUROSEMIDE 40 MG PO TABS Oral Take 40 mg by mouth daily.      . ISOSORBIDE MONONITRATE ER 30 MG PO TB24 Oral Take 30 mg by mouth daily.      Marland Kitchen OLANZAPINE 2.5 MG PO TABS Oral Take 2.5 mg by mouth at bedtime.      . OMEPRAZOLE 20 MG PO CPDR Oral Take 20 mg by mouth daily.      . QUETIAPINE FUMARATE 50 MG PO TABS Oral Take 50 mg by mouth at bedtime.      Marland Kitchen SIMVASTATIN 10 MG PO TABS Oral Take 10 mg by mouth at bedtime.      . TRAZODONE HCL 50 MG PO TABS Oral Take 50 mg by mouth at bedtime.      Marland Kitchen NITROGLYCERIN 0.4 MG SL SUBL Sublingual Place 0.4 mg under the tongue every 5 (five) minutes as needed.      Marland Kitchen TIOTROPIUM BROMIDE MONOHYDRATE 18 MCG IN CAPS Inhalation Place 18 mcg into inhaler and inhale daily.        BP 118/70  Pulse 80  Temp 98.1 F (36.7 C)  Resp 18  SpO2 100%  Physical Exam  Nursing note and vitals reviewed. Constitutional: She is oriented to  person, place, and time. She appears well-developed.  HENT:  Head: Atraumatic.  Mouth/Throat: Oropharynx is clear and moist.  Eyes: Conjunctivae and EOM are normal. Pupils are equal, round, and reactive to light.  Neck: Normal range of motion. Neck supple.  Cardiovascular: Normal rate, regular rhythm, normal heart sounds and intact distal pulses.   Pulmonary/Chest: No respiratory distress. She has wheezes. She has no rales. She exhibits no tenderness.       SHe is able to speak in 2-3 word sentences. Decreased breath sounds bilaterally. Diffuse expiratory wheeze. Prolonged expiratory phase.  Abdominal: Soft. She exhibits no distension. There is no tenderness. There is no rebound and no guarding.  Musculoskeletal: Normal range of motion. She exhibits no edema and no tenderness.  Neurological: She is alert and oriented to person, place, and time.  Skin: Skin is warm and dry. No rash noted.       Extremities cool to the touch  Psychiatric: She has a normal mood and affect.     Date: 02/16/2011  Rate: 87  Rhythm: NSR  QRS Axis: normal  Intervals: normal  ST/T Wave abnormalities: nonspecific STE  Conduction Disutrbances:none  Narrative Interpretation:   Old EKG Reviewed: unchanged   ED Course  Procedures (including critical care time) CRITICAL CARE Performed by: Forbes Cellar   Total critical care time: 60  Critical care time was exclusive of separately billable procedures and treating other patients.  Critical care was necessary to treat or prevent imminent or life-threatening deterioration.  Critical care was time spent personally by me on the following activities: development of treatment plan with patient and/or surrogate as well as nursing, discussions with consultants, evaluation of patient's response to treatment, examination of patient, obtaining history from patient or surrogate, ordering and performing treatments and interventions, ordering and review of laboratory  studies, ordering and review of radiographic studies, pulse oximetry and re-evaluation of patient's condition.  Labs Reviewed  COMPREHENSIVE METABOLIC PANEL - Abnormal; Notable for the following:    Glucose, Bld 139 (*)    Creatinine, Ser 1.23 (*)    GFR calc non Af Amer 45 (*)    GFR calc Af Amer 52 (*)    All other components within normal limits  CARDIAC PANEL(CRET KIN+CKTOT+MB+TROPI) - Abnormal;  Notable for the following:    Total CK 239 (*)    CK, MB 5.3 (*)    All other components within normal limits  PRO B NATRIURETIC PEPTIDE - Abnormal; Notable for the following:    Pro B Natriuretic peptide (BNP) 1996.0 (*)    All other components within normal limits  POCT I-STAT 3, BLOOD GAS (G3+) - Abnormal; Notable for the following:    pH, Arterial 7.321 (*)    pCO2 arterial 60.2 (*)    pO2, Arterial 46.0 (*)    Bicarbonate 31.1 (*)    Acid-Base Excess 3.0 (*)    All other components within normal limits  CBC  DIFFERENTIAL  URINE RAPID DRUG SCREEN (HOSP PERFORMED)  CULTURE, BLOOD (ROUTINE X 2)  CULTURE, BLOOD (ROUTINE X 2)   Ct Angio Chest W/cm &/or Wo Cm  02/17/2011  *RADIOLOGY REPORT*  Clinical Data:  Shortness of breath; cocaine abuse.  Recent diagnosis of breast cancer.  CT ANGIOGRAPHY CHEST WITH CONTRAST  Technique:  Multidetector CT imaging of the chest was performed using the standard protocol during bolus administration of intravenous contrast.  Multiplanar CT image reconstructions including MIPs were obtained to evaluate the vascular anatomy.  Contrast: 80mL OMNIPAQUE IOHEXOL 350 MG/ML IV SOLN  Comparison:  Chest radiograph performed earlier today at 12:23 a.m., and CT of the chest performed 07/11/2010  Findings:  There is no evidence of pulmonary embolus.  Prominent blebs are noted at the lung apices.  Diffuse emphysema is noted bilaterally, particularly at the upper lung lobes.  Scarring or atelectasis is noted at the lung bases.  There is no evidence of significant focal  consolidation, pleural effusion or pneumothorax. No masses are identified; no abnormal focal contrast enhancement is seen.  Diffuse coronary artery calcifications are noted.  The mediastinum is otherwise unremarkable in appearance.  No mediastinal lymphadenopathy is seen.  Scattered calcification is noted along the origins of the great vessels; the great vessels are otherwise grossly unremarkable in appearance.  There is filling of prominent collateral vessels along the right chest wall.  No axillary lymphadenopathy is seen.  The thyroid gland is unremarkable in appearance.  [name] known left-sided breast cancer is again characterized; it has increased in size, now measuring 1.8 cm, with minimal calcification.  The visualized portions of the liver and spleen are unremarkable. The visualized portions of the pancreas, stomach, adrenal glands and kidneys are within normal limits.  Diffuse calcification is noted along the proximal abdominal aorta.  No acute osseous abnormalities are seen.  Review of the MIP images confirms the above findings.  IMPRESSION:  1.  No evidence of pulmonary embolus. 2.  Diffuse bilateral emphysema, particularly at the upper lung lobes, with prominent blebs at the apices. 3.  Scarring or atelectasis at the lung bases. 4.  Diffuse coronary artery calcifications seen.  5.  Diffuse calcification along the proximal abdominal aorta. 6.  [name] known left-sided breast cancer has increased in size to 1.8 cm, on comparison with the prior CT from May 2012.  Original Report Authenticated By: Tonia Ghent, M.D.   Dg Chest Portable 1 View  02/17/2011  *RADIOLOGY REPORT*  Clinical Data: Acute onset of shortness of breath.  PORTABLE CHEST - 1 VIEW  Comparison: Chest radiograph performed 08/03/2010  Findings: The lungs are well-aerated.  Mild bibasilar opacities likely reflect atelectasis.  There is no evidence of pleural effusion or pneumothorax.  The cardiomediastinal silhouette is  mildly enlarged.  No acute osseous abnormalities are seen.  Multiple healed left-sided rib fractures are noted.  IMPRESSION: Mild bibasilar airspace opacities likely reflect atelectasis; mild cardiomegaly noted.  Original Report Authenticated By: Tonia Ghent, M.D.     1. Shortness of breath   2. COPD (chronic obstructive pulmonary disease)     MDM  Lesions with acute shortness of breath, wheezing be secondary to COPD exacerbation but may be multifactorial given her extensive history of congestive heart failure coronary artery disease. She's also recently diagnosed with breast cancer and has not yet begun treatment. BiPAP ordered on arrival. Medium, steroids, albuterol with ipratropium. Portable chest x-ray. Reassess. When patient stable she'll likely need CTA versus heparin at treatment dose for potential PE.  She is doing well off BiPAP. She continues to complain of mild shortness of breath and has a mild expiratory wheeze. Her chest x-ray and CT are unremarkable for pneumonia. She does not have a pulmonary embolism although a CT her left-sided breast cancer has increased in size from previous. Neb prn. Admit for obs. D/W Dr. Sharyn Lull who declined admit at this time. DW Hospitalist Dr. Lum Keas. Admit to tele        Forbes Cellar, MD 02/17/11 [phone]

## 2011-02-16 NOTE — ED Notes (Signed)
Ekg shown to Dr. Hyman Hopes, copies in the chart

## 2011-02-16 NOTE — ED Notes (Signed)
Pt c/o SOB for the past 45 min.  No CP, denies fevers/chills/cough

## 2011-02-17 ENCOUNTER — Emergency Department (HOSPITAL_COMMUNITY): Payer: Medicare Other

## 2011-02-17 ENCOUNTER — Encounter (HOSPITAL_COMMUNITY): Payer: Self-pay | Admitting: Radiology

## 2011-02-17 LAB — RAPID URINE DRUG SCREEN, HOSP PERFORMED
Amphetamines: NOT DETECTED
Benzodiazepines: NOT DETECTED
Cocaine: NOT DETECTED
Opiates: NOT DETECTED
Tetrahydrocannabinol: NOT DETECTED

## 2011-02-17 LAB — CBC
Hemoglobin: 13.9 g/dL (ref 12.0–15.0)
MCH: 30.3 pg (ref 26.0–34.0)
Platelets: 195 10*3/uL (ref 150–400)
RBC: 4.59 MIL/uL (ref 3.87–5.11)
WBC: 6.9 10*3/uL (ref 4.0–10.5)

## 2011-02-17 LAB — DIFFERENTIAL
Basophils Relative: 0 % (ref 0–1)
Eosinophils Absolute: 0.1 10*3/uL (ref 0.0–0.7)
Lymphs Abs: 1.4 10*3/uL (ref 0.7–4.0)
Monocytes Relative: 7 % (ref 3–12)
Neutro Abs: 5 10*3/uL (ref 1.7–7.7)
Neutrophils Relative %: 72 % (ref 43–77)

## 2011-02-17 LAB — CARDIAC PANEL(CRET KIN+CKTOT+MB+TROPI): Relative Index: 2.2 (ref 0.0–2.5)

## 2011-02-17 LAB — COMPREHENSIVE METABOLIC PANEL
ALT: 10 U/L (ref 0–35)
Albumin: 4 g/dL (ref 3.5–5.2)
Alkaline Phosphatase: 92 U/L (ref 39–117)
BUN: 21 mg/dL (ref 6–23)
Chloride: 100 mEq/L (ref 96–112)
Glucose, Bld: 139 mg/dL — ABNORMAL HIGH (ref 70–99)
Potassium: 4.4 mEq/L (ref 3.5–5.1)
Sodium: 138 mEq/L (ref 135–145)
Total Bilirubin: 0.5 mg/dL (ref 0.3–1.2)
Total Protein: 8.2 g/dL (ref 6.0–8.3)

## 2011-02-17 LAB — POCT I-STAT 3, ART BLOOD GAS (G3+)
Acid-Base Excess: 3 mmol/L — ABNORMAL HIGH (ref 0.0–2.0)
O2 Saturation: 77 %
Patient temperature: 98.6
TCO2: 33 mmol/L (ref 0–100)

## 2011-02-17 LAB — PRO B NATRIURETIC PEPTIDE: Pro B Natriuretic peptide (BNP): 1996 pg/mL — ABNORMAL HIGH (ref 0–125)

## 2011-02-17 MED ORDER — ACETAMINOPHEN 325 MG PO TABS: 650.0000 mg | ORAL_TABLET | Freq: Four times a day (QID) | ORAL | Status: DC | PRN

## 2011-02-17 MED ORDER — MOXIFLOXACIN HCL IN NACL 400 MG/250ML IV SOLN
400.0000 mg | INTRAVENOUS | Status: DC
  Administered 2011-02-17 – 2011-02-18 (×2): 400 mg via INTRAVENOUS
  Filled 2011-02-17 (×2): qty 250

## 2011-02-17 MED ORDER — ALBUTEROL SULFATE (5 MG/ML) 0.5% IN NEBU: 2.5000 mg | INHALATION_SOLUTION | RESPIRATORY_TRACT | Status: DC | PRN

## 2011-02-17 MED ORDER — TRAZODONE HCL 50 MG PO TABS
50.0000 mg | ORAL_TABLET | Freq: Every day | ORAL | Status: DC
  Administered 2011-02-17 – 2011-02-18 (×2): 50 mg via ORAL
  Filled 2011-02-17 (×3): qty 1

## 2011-02-17 MED ORDER — IPRATROPIUM BROMIDE 0.02 % IN SOLN
0.5000 mg | Freq: Four times a day (QID) | RESPIRATORY_TRACT | Status: DC
  Administered 2011-02-17 – 2011-02-18 (×4): 0.5 mg via RESPIRATORY_TRACT
  Filled 2011-02-17 (×4): qty 2.5

## 2011-02-17 MED ORDER — CLOPIDOGREL BISULFATE 75 MG PO TABS
75.0000 mg | ORAL_TABLET | Freq: Every day | ORAL | Status: DC
  Administered 2011-02-17 – 2011-02-19 (×3): 75 mg via ORAL
  Filled 2011-02-17 (×3): qty 1

## 2011-02-17 MED ORDER — ALBUTEROL SULFATE (5 MG/ML) 0.5% IN NEBU
2.5000 mg | INHALATION_SOLUTION | RESPIRATORY_TRACT | Status: DC
  Administered 2011-02-16 – 2011-02-17 (×2): 2.5 mg via RESPIRATORY_TRACT
  Filled 2011-02-17: qty 0.5

## 2011-02-17 MED ORDER — ONDANSETRON HCL 4 MG PO TABS: 4.0000 mg | ORAL_TABLET | Freq: Four times a day (QID) | ORAL | Status: DC | PRN

## 2011-02-17 MED ORDER — IPRATROPIUM BROMIDE 0.02 % IN SOLN
0.5000 mg | RESPIRATORY_TRACT | Status: DC
  Administered 2011-02-17: 0.5 mg via RESPIRATORY_TRACT
  Filled 2011-02-17: qty 2.5

## 2011-02-17 MED ORDER — BUDESONIDE 0.5 MG/2ML IN SUSP: 0.5000 mg | Freq: Two times a day (BID) | RESPIRATORY_TRACT | Status: DC

## 2011-02-17 MED ORDER — DIPHENHYDRAMINE HCL 25 MG PO CAPS
25.0000 mg | ORAL_CAPSULE | Freq: Once | ORAL | Status: AC
  Administered 2011-02-18: 25 mg via ORAL
  Filled 2011-02-17: qty 1

## 2011-02-17 MED ORDER — QUETIAPINE FUMARATE 50 MG PO TABS
50.0000 mg | ORAL_TABLET | Freq: Every day | ORAL | Status: DC
  Administered 2011-02-17 – 2011-02-18 (×2): 50 mg via ORAL
  Filled 2011-02-17 (×3): qty 1

## 2011-02-17 MED ORDER — DOCUSATE SODIUM 100 MG PO CAPS
100.0000 mg | ORAL_CAPSULE | Freq: Two times a day (BID) | ORAL | Status: DC
  Administered 2011-02-17 – 2011-02-19 (×5): 100 mg via ORAL
  Filled 2011-02-17 (×7): qty 1

## 2011-02-17 MED ORDER — BUDESONIDE 0.5 MG/2ML IN SUSP
0.5000 mg | Freq: Two times a day (BID) | RESPIRATORY_TRACT | Status: DC
  Administered 2011-02-17 – 2011-02-19 (×4): 0.5 mg via RESPIRATORY_TRACT
  Filled 2011-02-17 (×8): qty 2

## 2011-02-17 MED ORDER — ONDANSETRON HCL 4 MG/2ML IJ SOLN: 4.0000 mg | Freq: Four times a day (QID) | INTRAMUSCULAR | Status: DC | PRN

## 2011-02-17 MED ORDER — ACETAMINOPHEN 650 MG RE SUPP: 650.0000 mg | Freq: Four times a day (QID) | RECTAL | Status: DC | PRN

## 2011-02-17 MED ORDER — PANTOPRAZOLE SODIUM 40 MG PO TBEC
40.0000 mg | DELAYED_RELEASE_TABLET | Freq: Every day | ORAL | Status: DC
  Administered 2011-02-17 – 2011-02-19 (×3): 40 mg via ORAL
  Filled 2011-02-17 (×2): qty 1

## 2011-02-17 MED ORDER — SIMVASTATIN 10 MG PO TABS
10.0000 mg | ORAL_TABLET | Freq: Every day | ORAL | Status: DC
  Administered 2011-02-17 – 2011-02-18 (×2): 10 mg via ORAL
  Filled 2011-02-17 (×3): qty 1

## 2011-02-17 MED ORDER — METHYLPREDNISOLONE SODIUM SUCC 40 MG IJ SOLR
40.0000 mg | Freq: Two times a day (BID) | INTRAMUSCULAR | Status: DC
  Administered 2011-02-17 – 2011-02-19 (×5): 40 mg via INTRAVENOUS
  Filled 2011-02-17 (×7): qty 1

## 2011-02-17 MED ORDER — FUROSEMIDE 40 MG PO TABS
40.0000 mg | ORAL_TABLET | Freq: Every day | ORAL | Status: DC
  Administered 2011-02-17 – 2011-02-19 (×3): 40 mg via ORAL
  Filled 2011-02-17 (×4): qty 1

## 2011-02-17 MED ORDER — SODIUM CHLORIDE 0.9 % IJ SOLN
3.0000 mL | Freq: Two times a day (BID) | INTRAMUSCULAR | Status: DC
  Administered 2011-02-17 – 2011-02-19 (×5): 3 mL via INTRAVENOUS

## 2011-02-17 MED ORDER — IOHEXOL 350 MG/ML SOLN
80.0000 mL | Freq: Once | INTRAVENOUS | Status: AC | PRN
  Administered 2011-02-17: 80 mL via INTRAVENOUS

## 2011-02-17 MED ORDER — OLANZAPINE 2.5 MG PO TABS
2.5000 mg | ORAL_TABLET | Freq: Every day | ORAL | Status: DC
  Administered 2011-02-17 – 2011-02-18 (×2): 2.5 mg via ORAL
  Filled 2011-02-17 (×3): qty 1

## 2011-02-17 MED ORDER — ASPIRIN 81 MG PO CHEW
81.0000 mg | CHEWABLE_TABLET | Freq: Every day | ORAL | Status: DC
  Administered 2011-02-17 – 2011-02-19 (×3): 81 mg via ORAL
  Filled 2011-02-17 (×2): qty 1

## 2011-02-17 MED ORDER — ISOSORBIDE MONONITRATE ER 30 MG PO TB24
30.0000 mg | ORAL_TABLET | Freq: Every day | ORAL | Status: DC
  Administered 2011-02-17 – 2011-02-19 (×3): 30 mg via ORAL
  Filled 2011-02-17 (×4): qty 1

## 2011-02-17 MED ORDER — CARVEDILOL 3.125 MG PO TABS
3.1250 mg | ORAL_TABLET | Freq: Two times a day (BID) | ORAL | Status: DC
  Administered 2011-02-17 – 2011-02-19 (×6): 3.125 mg via ORAL
  Filled 2011-02-17 (×8): qty 1

## 2011-02-17 MED ORDER — ALBUTEROL SULFATE (5 MG/ML) 0.5% IN NEBU
2.5000 mg | INHALATION_SOLUTION | Freq: Four times a day (QID) | RESPIRATORY_TRACT | Status: DC
  Administered 2011-02-17 – 2011-02-18 (×4): 2.5 mg via RESPIRATORY_TRACT
  Filled 2011-02-17 (×4): qty 0.5

## 2011-02-17 MED ORDER — NITROGLYCERIN 0.4 MG SL SUBL
0.4000 mg | SUBLINGUAL_TABLET | SUBLINGUAL | Status: DC | PRN
  Administered 2011-02-18: 0.4 mg via SUBLINGUAL
  Filled 2011-02-17: qty 25

## 2011-02-17 NOTE — Progress Notes (Signed)
Patient ID: [name], female   DOB: 1946/01/30, 65 y.o.   MRN: [id number] Subjective: Patient seen.Complain about sob but denies any chest pain  Objective: Weight change:   Intake/Output Summary (Last 24 hours) at 02/17/11 1536 Last data filed at 02/17/11 1508  Gross per 24 hour  Intake    240 ml  Output    300 ml  Net    -60 ml   BP 134/78  Pulse 76  Temp(Src) 98.8 F (37.1 C) (Oral)  Resp 18  Ht 5\' 1"  (1.549 m)  Wt 46.1 kg (101 lb 10.1 oz)  BMI 19.20 kg/m2  SpO2 97% Physical Exam: General appearance: alert, cooperative and no distress Head: Normocephalic, without obvious abnormality, atraumatic Neck: no adenopathy, no carotid bruit, no JVD, supple, symmetrical, trachea midline and thyroid not enlarged, symmetric, no tenderness/mass/nodules Lungs: minimally scattered rhonchi all over the lung field Heart: regular rate and rhythm, S1, S2 normal, no murmur, click, rub or gallop Abdomen: soft, non-tender; bowel sounds normal; no masses,  no organomegaly Extremities: extremities normal, atraumatic, no cyanosis or edema Skin: Skin color, texture, turgor normal. No rashes or lesions  Lab Results: Results for orders placed during the hospital encounter of 02/16/11 (from the past 48 hour(s))  POCT I-STAT 3, BLOOD GAS (G3+)     Status: Abnormal   Collection Time   02/17/11 12:02 AM      Component Value Range Comment   pH, Arterial 7.321 (*) 7.350 - 7.400     pCO2 arterial 60.2 (*) 35.0 - 45.0 (mmHg)    pO2, Arterial 46.0 (*) 80.0 - 100.0 (mmHg)    Bicarbonate 31.1 (*) 20.0 - 24.0 (mEq/L)    TCO2 33  0 - 100 (mmol/L)    O2 Saturation 77.0      Acid-Base Excess 3.0 (*) 0.0 - 2.0 (mmol/L)    Patient temperature 98.6 F      Collection site RADIAL, ALLEN'S TEST ACCEPTABLE      Drawn by Operator      Sample type ARTERIAL      Comment NOTIFIED PHYSICIAN     CBC     Status: Normal   Collection Time   02/17/11 12:16 AM      Component Value Range Comment   WBC 6.9  4.0 -  10.5 (K/uL)    RBC 4.59  3.87 - 5.11 (MIL/uL)    Hemoglobin 13.9  12.0 - 15.0 (g/dL)    HCT 40.9  81.1 - 91.4 (%)    MCV 95.6  78.0 - 100.0 (fL)    MCH 30.3  26.0 - 34.0 (pg)    MCHC 31.7  30.0 - 36.0 (g/dL)    RDW 78.2  95.6 - 21.3 (%)    Platelets 195  150 - 400 (K/uL)   DIFFERENTIAL     Status: Normal   Collection Time   02/17/11 12:16 AM      Component Value Range Comment   Neutrophils Relative 72  43 - 77 (%)    Neutro Abs 5.0  1.7 - 7.7 (K/uL)    Lymphocytes Relative 20  12 - 46 (%)    Lymphs Abs 1.4  0.7 - 4.0 (K/uL)    Monocytes Relative 7  3 - 12 (%)    Monocytes Absolute 0.5  0.1 - 1.0 (K/uL)    Eosinophils Relative 2  0 - 5 (%)    Eosinophils Absolute 0.1  0.0 - 0.7 (K/uL)    Basophils Relative 0  0 -  1 (%)    Basophils Absolute 0.0  0.0 - 0.1 (K/uL)   COMPREHENSIVE METABOLIC PANEL     Status: Abnormal   Collection Time   02/17/11 12:16 AM      Component Value Range Comment   Sodium 138  135 - 145 (mEq/L)    Potassium 4.4  3.5 - 5.1 (mEq/L)    Chloride 100  96 - 112 (mEq/L)    CO2 25  19 - 32 (mEq/L)    Glucose, Bld 139 (*) 70 - 99 (mg/dL)    BUN 21  6 - 23 (mg/dL)    Creatinine, Ser 1.61 (*) 0.50 - 1.10 (mg/dL)    Calcium 09.6  8.4 - 10.5 (mg/dL)    Total Protein 8.2  6.0 - 8.3 (g/dL)    Albumin 4.0  3.5 - 5.2 (g/dL)    AST 25  0 - 37 (U/L)    ALT 10  0 - 35 (U/L)    Alkaline Phosphatase 92  39 - 117 (U/L)    Total Bilirubin 0.5  0.3 - 1.2 (mg/dL)    GFR calc non Af Amer 45 (*) >90 (mL/min)    GFR calc Af Amer 52 (*) >90 (mL/min)   CARDIAC PANEL(CRET KIN+CKTOT+MB+TROPI)     Status: Abnormal   Collection Time   02/17/11 12:17 AM      Component Value Range Comment   Total CK 239 (*) 7 - 177 (U/L)    CK, MB 5.3 (*) 0.3 - 4.0 (ng/mL)    Troponin I <0.30  <0.30 (ng/mL)    Relative Index 2.2  0.0 - 2.5    PRO B NATRIURETIC PEPTIDE     Status: Abnormal   Collection Time   02/17/11 12:17 AM      Component Value Range Comment   Pro B Natriuretic peptide (BNP)  1996.0 (*) 0 - 125 (pg/mL)   URINE RAPID DRUG SCREEN (HOSP PERFORMED)     Status: Normal   Collection Time   02/17/11  4:20 AM      Component Value Range Comment   Opiates NONE DETECTED  NONE DETECTED     Cocaine NONE DETECTED  NONE DETECTED     Benzodiazepines NONE DETECTED  NONE DETECTED     Amphetamines NONE DETECTED  NONE DETECTED     Tetrahydrocannabinol NONE DETECTED  NONE DETECTED     Barbiturates NONE DETECTED  NONE DETECTED      Micro Results: No results found for this or any previous visit (from the past 240 hour(s)).  Studies/Results: Ct Angio Chest W/cm &/or Wo Cm  02/17/2011  *RADIOLOGY REPORT*  Clinical Data:  Shortness of breath; cocaine abuse.  Recent diagnosis of breast cancer.  CT ANGIOGRAPHY CHEST WITH CONTRAST  Technique:  Multidetector CT imaging of the chest was performed using the standard protocol during bolus administration of intravenous contrast.  Multiplanar CT image reconstructions including MIPs were obtained to evaluate the vascular anatomy.  Contrast: 80mL OMNIPAQUE IOHEXOL 350 MG/ML IV SOLN  Comparison:  Chest radiograph performed earlier today at 12:23 a.m., and CT of the chest performed 07/11/2010  Findings:  There is no evidence of pulmonary embolus.  Prominent blebs are noted at the lung apices.  Diffuse emphysema is noted bilaterally, particularly at the upper lung lobes.  Scarring or atelectasis is noted at the lung bases.  There is no evidence of significant focal consolidation, pleural effusion or pneumothorax. No masses are identified; no abnormal focal contrast enhancement is seen.  Diffuse coronary  artery calcifications are noted.  The mediastinum is otherwise unremarkable in appearance.  No mediastinal lymphadenopathy is seen.  Scattered calcification is noted along the origins of the great vessels; the great vessels are otherwise grossly unremarkable in appearance.  There is filling of prominent collateral vessels along the right chest wall.  No  axillary lymphadenopathy is seen.  The thyroid gland is unremarkable in appearance.  [name] known left-sided breast cancer is again characterized; it has increased in size, now measuring 1.8 cm, with minimal calcification.  The visualized portions of the liver and spleen are unremarkable. The visualized portions of the pancreas, stomach, adrenal glands and kidneys are within normal limits.  Diffuse calcification is noted along the proximal abdominal aorta.  No acute osseous abnormalities are seen.  Review of the MIP images confirms the above findings.  IMPRESSION:  1.  No evidence of pulmonary embolus. 2.  Diffuse bilateral emphysema, particularly at the upper lung lobes, with prominent blebs at the apices. 3.  Scarring or atelectasis at the lung bases. 4.  Diffuse coronary artery calcifications seen.  5.  Diffuse calcification along the proximal abdominal aorta. 6.  [name] known left-sided breast cancer has increased in size to 1.8 cm, on comparison with the prior CT from May 2012.  Original Report Authenticated By: Tonia Ghent, M.D.   US Breast Left  01/28/2011  *RADIOLOGY REPORT*  Clinical Data:  Patient presents for additional views of the left breast as follow-up to a screening exam suggesting a possible mass.  DIGITAL DIAGNOSTIC LEFT MAMMOGRAM  AND LEFT BREAST ULTRASOUND:  Comparison:  Screening mammogram 11/25/2010  Findings:  Spot compression images demonstrate an ill-defined 2 cm mass with associated microcalcifications over the upper outer left breast.  On physical exam, there is a firm tender 2 cm mass over the 2 o'clock position of the left breast 10 cm from the nipple.  Ultrasound is performed, showing a hypoechoic somewhat heterogeneous lobulated mass at the 2 o'clock position of the left breast 10 cm from the nipple measuring 1.4 x 1.9 x 2.2 cm. Ultrasound examination over the left axillary region demonstrates no evidence of abnormal appearing lymph nodes.  IMPRESSION: Hypoechoic  heterogeneous lobulated mass at the 2 o'clock position of the left breast 10 cm from the nipple with associated microcalcifications measuring 1.4 x 1.9 x 2.2 cm. This is suspicious for a neoplastic process.  Recommendations: Recommend ultrasound-guided core needle biopsy of this suspicious abnormality.  Biopsy will be performed today.  BI-RADS CATEGORY 4:  Suspicious abnormality - biopsy should be considered.  Original Report Authenticated By: [id number]   [place] Core Biopsy  01/31/2011  **ADDENDUM** CREATED: 01/31/2011 12:12:00  Final pathology demonstrates INVASIVE DUCTAL CARCINOMA. Histology correlates with imaging findings.  [name] sister, [name], was contacted by phone on 01/31/2011 and these results given to her which she understood. Her questions were answered. The patient had no complaints with her biopsy site, per her sister.  Recommend surgical evaluation of the left breast.  An appointment with Dr. Luisa Hart [facility]) has been scheduled for 02/10/2011 and the patient's sister informed.  Consider bilateral breast MRI depending on patient ability to tolerate the study.  Addended by:  Elpidio Eric. Si Gaul, M.D. on 01/31/2011 12:12:00.  **END ADDENDUM** SIGNED BY: Tinnie Gens T. Si Gaul, M.D.   01/31/2011  *RADIOLOGY REPORT*  Clinical Data:  New heterogeneous lobulated hypoechoic solid mass with associated microcalcifications at the 2 o'clock position of the left breast 10 cm from the nipple measuring 2.2 cm  in greatest diameter.  ULTRASOUND GUIDED VACUUM ASSISTED CORE BIOPSY OF THE LEFT BREAST  I met with the patient, and we discussed the procedure of ultrasound-guided biopsy, including benefits and alternatives.  We discussed the high likelihood of a successful procedure. We discussed the risks of the procedure, including infection, bleeding, tissue injury, clip migration, and inadequate sampling.  Informed, written consent was given. Using sterile technique, 2% lidocaine, ultrasound guidance,  and a 12 gauge vacuum assisted needle, biopsy was performed of the targeted mass at the 2 o'clock position of the left breast.  At the conclusion of the procedure, a tissue marker clip was deployed into the biopsy cavity. A follow-up sonographic image demonstrates the clip within the biopsied mass.  Post clip mammographic images were not obtained due to patient's debilitated physical and mental status and extreme soreness at the biopsy site.  IMPRESSION: Ultrasound-guided biopsy of the targeted suspicious mass at the 2 o'clock position of the left breast.  No apparent complications.  Original Report Authenticated By: Rosendo Gros, M.D.   Dg Chest Portable 1 View  02/17/2011  *RADIOLOGY REPORT*  Clinical Data: Acute onset of shortness of breath.  PORTABLE CHEST - 1 VIEW  Comparison: Chest radiograph performed 08/03/2010  Findings: The lungs are well-aerated.  Mild bibasilar opacities likely reflect atelectasis.  There is no evidence of pleural effusion or pneumothorax.  The cardiomediastinal silhouette is mildly enlarged.  No acute osseous abnormalities are seen.  Multiple healed left-sided rib fractures are noted.  IMPRESSION: Mild bibasilar airspace opacities likely reflect atelectasis; mild cardiomegaly noted.  Original Report Authenticated By: Tonia Ghent, M.D.   Mm Digital Diag Ltd L  01/28/2011  *RADIOLOGY REPORT*  Clinical Data:  Patient presents for additional views of the left breast as follow-up to a screening exam suggesting a possible mass.  DIGITAL DIAGNOSTIC LEFT MAMMOGRAM  AND LEFT BREAST ULTRASOUND:  Comparison:  Screening mammogram 11/25/2010  Findings:  Spot compression images demonstrate an ill-defined 2 cm mass with associated microcalcifications over the upper outer left breast.  On physical exam, there is a firm tender 2 cm mass over the 2 o'clock position of the left breast 10 cm from the nipple.  Ultrasound is performed, showing a hypoechoic somewhat heterogeneous lobulated mass at  the 2 o'clock position of the left breast 10 cm from the nipple measuring 1.4 x 1.9 x 2.2 cm. Ultrasound examination over the left axillary region demonstrates no evidence of abnormal appearing lymph nodes.  IMPRESSION: Hypoechoic heterogeneous lobulated mass at the 2 o'clock position of the left breast 10 cm from the nipple with associated microcalcifications measuring 1.4 x 1.9 x 2.2 cm. This is suspicious for a neoplastic process.  Recommendations: Recommend ultrasound-guided core needle biopsy of this suspicious abnormality.  Biopsy will be performed today.  BI-RADS CATEGORY 4:  Suspicious abnormality - biopsy should be considered.  Original Report Authenticated By: [id number]   Medications: Scheduled Meds:   . albuterol  2.5 mg Nebulization Q6H  . albuterol  10 mg/hr Nebulization Once  . aspirin  81 mg Oral Daily  . budesonide  0.5 mg Nebulization BID  . carvedilol  3.125 mg Oral BID WC  . clopidogrel  75 mg Oral Daily  . docusate sodium  100 mg Oral BID  . furosemide  40 mg Oral Daily  . ipratropium  0.5 mg Nebulization Once  . ipratropium  0.5 mg Nebulization Q6H  . isosorbide mononitrate  30 mg Oral Daily  . magnesium sulfate 1 - 4 g bolus  IVPB  2 g Intravenous To Major  . methylPREDNISolone (SOLU-MEDROL) injection  125 mg Intravenous Once  . methylPREDNISolone (SOLU-MEDROL) injection  40 mg Intravenous Q12H  . moxifloxacin  400 mg Intravenous Q24H  . OLANZapine  2.5 mg Oral QHS  . pantoprazole  40 mg Oral Q1200  . QUEtiapine  50 mg Oral QHS  . simvastatin  10 mg Oral QHS  . sodium chloride  3 mL Intravenous Q12H  . traZODone  50 mg Oral QHS  . DISCONTD: albuterol  2.5 mg Nebulization Q4H  . DISCONTD: budesonide  0.5 mg Nebulization BID  . DISCONTD: ipratropium  0.5 mg Nebulization Q4H   Continuous Infusions:  PRN Meds:.acetaminophen, acetaminophen, albuterol, iohexol, nitroGLYCERIN, ondansetron (ZOFRAN) IV, ondansetron  Assessment/Plan: #1 Patient Active Hospital Problem  List: #2 COPD EXACERBATION-will continue breathing treatment. #3 CHRONIC RESPIRATORY FAILURE-will continue 02 via nasal canulla #4 SCHIZOPHRENIA-will continue meds  #5 HX OF SUBSTANCE ABUSE #6  HYPERTENSION, BENIGN ESSENTIAL-Bp fairly stable #7  CHF-not acutely decompensating #8  CAD-will continue plavix   LOS: 1 day   Zaria Taha 02/17/2011, 3:36 PM

## 2011-02-17 NOTE — Progress Notes (Signed)
02/17/2011 Marsella Suman [facility] Case Management Note [phone]  Utilization review completed.  

## 2011-02-17 NOTE — ED Notes (Signed)
Notified CT scan that pt has 20g IV now

## 2011-02-17 NOTE — ED Notes (Signed)
2 RNs attempted X2 to get IV for CT scan; IV team paged

## 2011-02-17 NOTE — ED Notes (Signed)
IV team at bedside 

## 2011-02-17 NOTE — ED Notes (Signed)
Attempted to call report. RN unavailable 

## 2011-02-17 NOTE — ED Notes (Signed)
Patient placed on bedpan, and stretcher sheet changed. [facility] hospitalist in room.

## 2011-02-17 NOTE — ED Notes (Signed)
Received call from CT scan that IV that IV team started, they are unable to flush; pt brought back from CT scan

## 2011-02-17 NOTE — H&P (Signed)
[name] is an 65 y.o. female.   PCP - Dr Harwani(He has requested hopsitalist admission Chief Complaint: Shortness of breath. 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 Patient is a poor historian and does not provide any history. As per the ER physician patient was very short of breath initially and had to be placed on BiPAP. After continuous nebulization and steroids patient's status improved and is off BiPAP. Patient still having mild shortness of breath but is able to complete sentences. Patient had a CAT scan angiogram of the chest which was negative for PE. Patient admitted for further observation and management.  Past Medical History  Diagnosis Date  . Breast cancer   . Weight loss, unintentional   . Trouble swallowing   . Change in voice   . Hypertension   . Substance abuse   . Heart attack   . Dementia   . Asthma     Past Surgical History  Procedure Date  . Total abdominal hysterectomy age 45    Family History  Problem Relation Age of Onset  . Breast cancer Mother   . Cancer Mother   . Birth defects Mother     breast  . Heart disease Father     heart attack  . Heart attack Brother    Social History:  reports that she quit smoking about 2 years ago. Her smoking use included Cigarettes. She has never used smokeless tobacco. She reports that she drinks alcohol. She reports that she does not use illicit drugs.  Allergies:  Allergies  Allergen Reactions  . Codeine Itching    All over the body  . Penicillins Hives and Other (See Comments)    "Whelps" per patient.    Medications Prior to Admission  Medication Dose Route Frequency Provider Last Rate Last Dose  . ipratropium (ATROVENT) nebulizer solution 0.5 mg  0.5 mg Nebulization Q4H Forbes Cellar, MD   0.5 mg at 02/17/11 0521   And  . albuterol  (PROVENTIL) (5 MG/ML) 0.5% nebulizer solution 2.5 mg  2.5 mg Nebulization Q4H Forbes Cellar, MD   2.5 mg at 02/17/11 0521  . albuterol (PROVENTIL) (5 MG/ML) 0.5% nebulizer solution        2.5 mg at 02/16/11 2350  . albuterol (PROVENTIL,VENTOLIN) solution continuous neb  10 mg/hr Nebulization Once Forbes Cellar, MD   10 mg/hr at 02/17/11 0001  . iohexol (OMNIPAQUE) 350 MG/ML injection 80 mL  80 mL Intravenous Once PRN Medication Radiologist   80 mL at 02/17/11 0411  . ipratropium (ATROVENT) nebulizer solution 0.5 mg  0.5 mg Nebulization Once Forbes Cellar, MD   0.5 mg at 02/16/11 2349  . magnesium sulfate IVPB 2 g 50 mL  2 g Intravenous To Major Forbes Cellar, MD   2 g at 02/16/11 2358  . methylPREDNISolone sodium succinate (SOLU-MEDROL) 125 MG injection 125 mg  125 mg Intravenous Once Forbes Cellar, MD   125 mg at 02/16/11 2347   Medications Prior to Admission  Medication Sig Dispense Refill  . albuterol (PROVENTIL) (2.5 MG/3ML) 0.083% nebulizer solution Take 2.5 mg by nebulization every 6 (six) hours as needed. For wheezing      . albuterol (PROVENTIL) 90 MCG/ACT inhaler Inhale 2 puffs into the lungs every 6 (six) hours as needed. For wheezing       . aspirin 81 MG  tablet Take 81 mg by mouth daily.        . carvedilol (COREG) 3.125 MG tablet Take 3.125 mg by mouth 2 (two) times daily with a meal.        . clopidogrel (PLAVIX) 75 MG tablet Take 75 mg by mouth daily.        Marland Kitchen docusate sodium (COLACE) 100 MG capsule Take 100 mg by mouth 2 (two) times daily.        . furosemide (LASIX) 40 MG tablet Take 40 mg by mouth daily.        . isosorbide mononitrate (IMDUR) 30 MG 24 hr tablet Take 30 mg by mouth daily.        Marland Kitchen OLANZapine (ZYPREXA) 2.5 MG tablet Take 2.5 mg by mouth at bedtime.        Marland Kitchen omeprazole (PRILOSEC) 20 MG capsule Take 20 mg by mouth daily.        . QUEtiapine (SEROQUEL) 50 MG tablet Take 50 mg by mouth at bedtime.        . simvastatin (ZOCOR) 10 MG tablet Take 10 mg by mouth  at bedtime.        . traZODone (DESYREL) 50 MG tablet Take 50 mg by mouth at bedtime.        . nitroGLYCERIN (NITROSTAT) 0.4 MG SL tablet Place 0.4 mg under the tongue every 5 (five) minutes as needed.        . tiotropium (SPIRIVA) 18 MCG inhalation capsule Place 18 mcg into inhaler and inhale daily.          Results for orders placed during the hospital encounter of 02/16/11 (from the past 48 hour(s))  POCT I-STAT 3, BLOOD GAS (G3+)     Status: Abnormal   Collection Time   02/17/11 12:02 AM      Component Value Range Comment   pH, Arterial 7.321 (*) 7.350 - 7.400     pCO2 arterial 60.2 (*) 35.0 - 45.0 (mmHg)    pO2, Arterial 46.0 (*) 80.0 - 100.0 (mmHg)    Bicarbonate 31.1 (*) 20.0 - 24.0 (mEq/L)    TCO2 33  0 - 100 (mmol/L)    O2 Saturation 77.0      Acid-Base Excess 3.0 (*) 0.0 - 2.0 (mmol/L)    Patient temperature 98.6 F      Collection site RADIAL, ALLEN'S TEST ACCEPTABLE      Drawn by Operator      Sample type ARTERIAL      Comment NOTIFIED PHYSICIAN     CBC     Status: Normal   Collection Time   02/17/11 12:16 AM      Component Value Range Comment   WBC 6.9  4.0 - 10.5 (K/uL)    RBC 4.59  3.87 - 5.11 (MIL/uL)    Hemoglobin 13.9  12.0 - 15.0 (g/dL)    HCT 16.1  09.6 - 04.5 (%)    MCV 95.6  78.0 - 100.0 (fL)    MCH 30.3  26.0 - 34.0 (pg)    MCHC 31.7  30.0 - 36.0 (g/dL)    RDW 40.9  81.1 - 91.4 (%)    Platelets 195  150 - 400 (K/uL)   DIFFERENTIAL     Status: Normal   Collection Time   02/17/11 12:16 AM      Component Value Range Comment   Neutrophils Relative 72  43 - 77 (%)    Neutro Abs 5.0  1.7 - 7.7 (K/uL)  Lymphocytes Relative 20  12 - 46 (%)    Lymphs Abs 1.4  0.7 - 4.0 (K/uL)    Monocytes Relative 7  3 - 12 (%)    Monocytes Absolute 0.5  0.1 - 1.0 (K/uL)    Eosinophils Relative 2  0 - 5 (%)    Eosinophils Absolute 0.1  0.0 - 0.7 (K/uL)    Basophils Relative 0  0 - 1 (%)    Basophils Absolute 0.0  0.0 - 0.1 (K/uL)   COMPREHENSIVE METABOLIC PANEL      Status: Abnormal   Collection Time   02/17/11 12:16 AM      Component Value Range Comment   Sodium 138  135 - 145 (mEq/L)    Potassium 4.4  3.5 - 5.1 (mEq/L)    Chloride 100  96 - 112 (mEq/L)    CO2 25  19 - 32 (mEq/L)    Glucose, Bld 139 (*) 70 - 99 (mg/dL)    BUN 21  6 - 23 (mg/dL)    Creatinine, Ser 1.61 (*) 0.50 - 1.10 (mg/dL)    Calcium 09.6  8.4 - 10.5 (mg/dL)    Total Protein 8.2  6.0 - 8.3 (g/dL)    Albumin 4.0  3.5 - 5.2 (g/dL)    AST 25  0 - 37 (U/L)    ALT 10  0 - 35 (U/L)    Alkaline Phosphatase 92  39 - 117 (U/L)    Total Bilirubin 0.5  0.3 - 1.2 (mg/dL)    GFR calc non Af Amer 45 (*) >90 (mL/min)    GFR calc Af Amer 52 (*) >90 (mL/min)   CARDIAC PANEL(CRET KIN+CKTOT+MB+TROPI)     Status: Abnormal   Collection Time   02/17/11 12:17 AM      Component Value Range Comment   Total CK 239 (*) 7 - 177 (U/L)    CK, MB 5.3 (*) 0.3 - 4.0 (ng/mL)    Troponin I <0.30  <0.30 (ng/mL)    Relative Index 2.2  0.0 - 2.5    PRO B NATRIURETIC PEPTIDE     Status: Abnormal   Collection Time   02/17/11 12:17 AM      Component Value Range Comment   Pro B Natriuretic peptide (BNP) 1996.0 (*) 0 - 125 (pg/mL)   URINE RAPID DRUG SCREEN (HOSP PERFORMED)     Status: Normal   Collection Time   02/17/11  4:20 AM      Component Value Range Comment   Opiates NONE DETECTED  NONE DETECTED     Cocaine NONE DETECTED  NONE DETECTED     Benzodiazepines NONE DETECTED  NONE DETECTED     Amphetamines NONE DETECTED  NONE DETECTED     Tetrahydrocannabinol NONE DETECTED  NONE DETECTED     Barbiturates NONE DETECTED  NONE DETECTED     Ct Angio Chest W/cm &/or Wo Cm  02/17/2011  *RADIOLOGY REPORT*  Clinical Data:  Shortness of breath; cocaine abuse.  Recent diagnosis of breast cancer.  CT ANGIOGRAPHY CHEST WITH CONTRAST  Technique:  Multidetector CT imaging of the chest was performed using the standard protocol during bolus administration of intravenous contrast.  Multiplanar CT image reconstructions  including MIPs were obtained to evaluate the vascular anatomy.  Contrast: 80mL OMNIPAQUE IOHEXOL 350 MG/ML IV SOLN  Comparison:  Chest radiograph performed earlier today at 12:23 a.m., and CT of the chest performed 07/11/2010  Findings:  There is no evidence of pulmonary embolus.  Prominent blebs are  noted at the lung apices.  Diffuse emphysema is noted bilaterally, particularly at the upper lung lobes.  Scarring or atelectasis is noted at the lung bases.  There is no evidence of significant focal consolidation, pleural effusion or pneumothorax. No masses are identified; no abnormal focal contrast enhancement is seen.  Diffuse coronary artery calcifications are noted.  The mediastinum is otherwise unremarkable in appearance.  No mediastinal lymphadenopathy is seen.  Scattered calcification is noted along the origins of the great vessels; the great vessels are otherwise grossly unremarkable in appearance.  There is filling of prominent collateral vessels along the right chest wall.  No axillary lymphadenopathy is seen.  The thyroid gland is unremarkable in appearance.  [name] known left-sided breast cancer is again characterized; it has increased in size, now measuring 1.8 cm, with minimal calcification.  The visualized portions of the liver and spleen are unremarkable. The visualized portions of the pancreas, stomach, adrenal glands and kidneys are within normal limits.  Diffuse calcification is noted along the proximal abdominal aorta.  No acute osseous abnormalities are seen.  Review of the MIP images confirms the above findings.  IMPRESSION:  1.  No evidence of pulmonary embolus. 2.  Diffuse bilateral emphysema, particularly at the upper lung lobes, with prominent blebs at the apices. 3.  Scarring or atelectasis at the lung bases. 4.  Diffuse coronary artery calcifications seen.  5.  Diffuse calcification along the proximal abdominal aorta. 6.  [name] known left-sided breast cancer has increased in  size to 1.8 cm, on comparison with the prior CT from May 2012.  Original Report Authenticated By: Tonia Ghent, M.D.   Dg Chest Portable 1 View  02/17/2011  *RADIOLOGY REPORT*  Clinical Data: Acute onset of shortness of breath.  PORTABLE CHEST - 1 VIEW  Comparison: Chest radiograph performed 08/03/2010  Findings: The lungs are well-aerated.  Mild bibasilar opacities likely reflect atelectasis.  There is no evidence of pleural effusion or pneumothorax.  The cardiomediastinal silhouette is mildly enlarged.  No acute osseous abnormalities are seen.  Multiple healed left-sided rib fractures are noted.  IMPRESSION: Mild bibasilar airspace opacities likely reflect atelectasis; mild cardiomegaly noted.  Original Report Authenticated By: Tonia Ghent, M.D.    Review of Systems  Constitutional: Negative.   HENT: Negative.   Eyes: Negative.   Respiratory: Positive for cough and shortness of breath.   Cardiovascular: Negative.   Gastrointestinal: Negative.   Genitourinary: Negative.   Musculoskeletal: Negative.   Skin: Negative.   Endo/Heme/Allergies: Negative.   Psychiatric/Behavioral: Negative.     Blood pressure 118/70, pulse 80, temperature 98.1 F (36.7 C), resp. rate 18, SpO2 100.00%. Physical Exam  Constitutional: She appears well-developed and well-nourished. No distress.  HENT:  Head: Normocephalic and atraumatic.  Right Ear: External ear normal.  Left Ear: External ear normal.  Nose: Nose normal.  Mouth/Throat: Oropharynx is clear and moist. No oropharyngeal exudate.  Eyes: Conjunctivae and EOM are normal. Pupils are equal, round, and reactive to light. Right eye exhibits no discharge. Left eye exhibits no discharge. No scleral icterus.  Neck: Normal range of motion. Neck supple.  Cardiovascular: Normal rate, regular rhythm and normal heart sounds.   Respiratory: Effort normal. She has wheezes. She has no rales.  GI: Soft. Bowel sounds are normal. She exhibits no distension. There  is no tenderness.  Musculoskeletal: Normal range of motion. She exhibits no edema.  Neurological: She is alert.       Oriented to her name only appears confused but  follows commands. Moves upper and lower extremities.  Skin: Skin is warm and dry. She is not diaphoretic.  Psychiatric: Her behavior is normal.     Assessment/Plan #1. COPD exacerbation - we will continue steroids place patient on antibiotics and nebulizers. #2. History of non-ST elevation MI second cocaine abuse and also history of cardiomyopathy last EF was around 10-20% - we will continue her present medications including Lasix but her present condition is due COPD exacerbation. #3. Breast cancer stage II - patient is following with oncology and surgery to keep up those appointments after discharge. Patient's sister with whom I spoke states she is scheduled for surgery next month. #4. History of tobacco abuse and cocaine abuse - patient states she has stopped using them. We'll check drug screen.  Patient in this time is confused and does not give proper history. I tried reaching patient's sister through the phone. And she also did not get a proper history. Reviewing patient's chart patient had this confusion going on for some time. Once patient's family comes to the hospital we need to address her mental status issues.  CODE STATUS - full code.  Eduard Clos 02/17/2011, 6:44 AM

## 2011-02-18 ENCOUNTER — Encounter: Payer: Self-pay | Admitting: *Deleted

## 2011-02-18 ENCOUNTER — Other Ambulatory Visit: Payer: Self-pay

## 2011-02-18 DIAGNOSIS — I10 Essential (primary) hypertension: Secondary | ICD-10-CM | POA: Insufficient documentation

## 2011-02-18 DIAGNOSIS — M2550 Pain in unspecified joint: Secondary | ICD-10-CM | POA: Insufficient documentation

## 2011-02-18 LAB — CARDIAC PANEL(CRET KIN+CKTOT+MB+TROPI)
CK, MB: 4.3 ng/mL — ABNORMAL HIGH (ref 0.3–4.0)
CK, MB: 3.9 ng/mL (ref 0.3–4.0)
Relative Index: 2.3 (ref 0.0–2.5)
Total CK: 180 U/L — ABNORMAL HIGH (ref 7–177)
Total CK: 168 U/L (ref 7–177)

## 2011-02-18 LAB — COMPREHENSIVE METABOLIC PANEL
AST: 20 U/L (ref 0–37)
Alkaline Phosphatase: 77 U/L (ref 39–117)
BUN: 30 mg/dL — ABNORMAL HIGH (ref 6–23)
CO2: 28 mEq/L (ref 19–32)
Chloride: 103 mEq/L (ref 96–112)
Creatinine, Ser: 1.23 mg/dL — ABNORMAL HIGH (ref 0.50–1.10)
GFR calc non Af Amer: 45 mL/min — ABNORMAL LOW (ref >90)
Potassium: 3.9 mEq/L (ref 3.5–5.1)
Total Bilirubin: 0.4 mg/dL (ref 0.3–1.2)

## 2011-02-18 LAB — CBC
HCT: 33.5 % — ABNORMAL LOW (ref 36.0–46.0)
MCV: 93.3 fL (ref 78.0–100.0)
RBC: 3.59 MIL/uL — ABNORMAL LOW (ref 3.87–5.11)
WBC: 11.8 10*3/uL — ABNORMAL HIGH (ref 4.0–10.5)

## 2011-02-18 MED ORDER — LEVOFLOXACIN 500 MG PO TABS: 500.0000 mg | ORAL_TABLET | Freq: Every day | ORAL | Status: DC

## 2011-02-18 MED ORDER — IPRATROPIUM BROMIDE 0.02 % IN SOLN
0.5000 mg | Freq: Two times a day (BID) | RESPIRATORY_TRACT | Status: DC
  Administered 2011-02-18 – 2011-02-19 (×2): 0.5 mg via RESPIRATORY_TRACT
  Filled 2011-02-18 (×2): qty 2.5

## 2011-02-18 MED ORDER — LEVOFLOXACIN 250 MG PO TABS: 250.0000 mg | ORAL_TABLET | Freq: Every day | ORAL | Status: DC

## 2011-02-18 MED ORDER — LEVOFLOXACIN 500 MG PO TABS
500.0000 mg | ORAL_TABLET | Freq: Once | ORAL | Status: AC
  Administered 2011-02-19: 500 mg via ORAL
  Filled 2011-02-18: qty 1

## 2011-02-18 MED ORDER — ALBUTEROL SULFATE (5 MG/ML) 0.5% IN NEBU
2.5000 mg | INHALATION_SOLUTION | Freq: Two times a day (BID) | RESPIRATORY_TRACT | Status: DC
  Administered 2011-02-18 – 2011-02-19 (×2): 2.5 mg via RESPIRATORY_TRACT
  Filled 2011-02-18 (×2): qty 0.5

## 2011-02-18 NOTE — Progress Notes (Signed)
Pt complaining of chest pain. BP 145/64 HR 69 O2 93% on RA. 1 SL nitro given. EKG done showing NSR with BBB. After nitro BP 130/77 HR 63 O2 100% on 2L. Chest pain resolved. MD notified. Order for cardiac panel. Will continue to monitor.

## 2011-02-18 NOTE — Clinical Documentation Improvement (Signed)
RESPIRATORY STATUS DOCUMENTATION CLARIFICATION QUERY   THIS DOCUMENT IS NOT A PERMANENT PART OF THE MEDICAL RECORD  Please update your documentation within the medical record to reflect your response to this query.                                                                                     02/18/11  Dr. Toniann Fail,  In a better effort to capture your patient's severity of illness, reflect appropriate length of stay and utilization of resources, a review of the patient medical record has revealed the following indicators.    Based on your clinical judgment, please document in an addendum to your H&P if a condition below provides greater specificity regarding the patient's respiratory status as treated in the ED:  - Acute Respiratory Failure  - Acute on Chronic Respiratory Failure  - Other Condition (please specify)  - Unable to Clinically Determine   Clinical Information:   "As per the ER physician patient was very short of breath initially and had to be placed on BiPAP. After continuous nebulization and steroids patient's status improved and is off BiPAP." H&P signed by Eduard Clos at 02/17/11 0659    ABG in ED ph - 7.32 pC02 - 60.2 p02 - 46.0 HC03 - 31.1 02 Sat - 77.0   In responding to this query please exercise your independent judgment.  The fact that a query is asked, does not imply that any particular answer is desired or expected.  Reviewed:  Octavio Graves never responded to query. Sent to [facility] for possible retroquery for coder 03/07/11.   Mathis Dad RN  Thank You,  Jerral Ralph  RN BSN Certified Clinical Documentation Specialist: Cell   [phone]  Health Information Management [place]  TO RESPOND TO THE THIS QUERY, FOLLOW THE INSTRUCTIONS BELOW:  1. If needed, update documentation for the patient's encounter via the notes activity.  2. Access this query again and click edit on the In [employer].  3. After updating, or not,  click F2 to complete all highlighted (required) fields concerning your review. Select "additional documentation in the medical record" OR "no additional documentation provided".  4. Click Sign note button.  5. The deficiency will fall out of your In Basket *Please let us know if you are not able to complete this workflow by phone or e-mail (listed below).

## 2011-02-18 NOTE — Progress Notes (Signed)
Lives w/daughter

## 2011-02-18 NOTE — Clinical Documentation Improvement (Signed)
ACUITY AND TYPE OF CHF DOCUMENTATION CLARIFICATION QUERY  THIS DOCUMENT IS NOT A PERMANENT PART OF THE MEDICAL RECORD  Please update your documentation within the medical record to reflect your response to this query.                                                                                     02/18/11  Dr. Beverly Gust,  In a better effort to capture your patient's severity of illness, reflect appropriate length of stay and utilization of resources, a review of the patient medical record has revealed the following indicators regarding the diagnosis of Heart Failure.    Based on your clinical judgment, please document the ACUITY and TYPE OF CHF treated this admission in the progress notes and discharge summary:  ACUITY: - Acute - Chronic  - Acute on Chronic  AND   TYPE: - Systolic - Diastolic - Combined  Clinical Information:   "#7 CHF-not acutely decompensating" Ender Rorke 02/17/2011, 3:36 PM    Study Date: 11/22/2009 Study Conclusions Left ventricle: The cavity size was normal. There was mild concentric hypertrophy. Systolic function was severely reduced. The estimated ejection fraction was in the range of 15% to 20%. There is severe hypokinesis of the entire myocardium. There is akinesis of the mid-distal anteroseptal myocardium. Doppler parameters are consistent with abnormal left ventricular relaxation (grade 1 diastolic dysfunction).   In responding to this query please exercise your independent judgment.  The fact that a query is asked, does not imply that any particular answer is desired or expected.   Reviewed: Chronic Congestive Heart Failure(systolic Dysfunction)  Thank You,  Jerral Ralph  RN BSN Certified Clinical Documentation Specialist: Cell   [phone]  Health Information Management [place]  TO RESPOND TO THE THIS QUERY, FOLLOW THE INSTRUCTIONS BELOW:  1. If needed, update documentation for the patient's encounter via the  notes activity.  2. Access this query again and click edit on the In [employer].  3. After updating, or not, click F2 to complete all highlighted (required) fields concerning your review. Select "additional documentation in the medical record" OR "no additional documentation provided".  4. Click Sign note button.  5. The deficiency will fall out of your In Basket *Please let us know if you are not able to complete this workflow by phone or e-mail (listed below).  Chronic Congestive Heart Failure(systolic dysfunction)

## 2011-02-18 NOTE — Progress Notes (Signed)
Patient ID: [name], female   DOB: [birth year], 65 y.o.   MRN: [id number] Patient ID: [name], female   DOB: [birth year], 65 y.o.   MRN: [id number] Subjective: Patient seen.Back to baseline sob  Objective: Weight change:   Intake/Output Summary (Last 24 hours) at 02/18/11 1822 Last data filed at 02/18/11 1804  Gross per 24 hour  Intake   2303 ml  Output   1200 ml  Net   1103 ml   BP 134/76  Pulse 72  Temp(Src) 98.8 F (37.1 C) (Oral)  Resp 16  Ht 5\' 1"  (1.549 m)  Wt 45.949 kg (101 lb 4.8 oz)  BMI 19.14 kg/m2  SpO2 100% Physical Exam: General appearance: alert, cooperative and no distress Head: Normocephalic, without obvious abnormality, atraumatic Neck: no adenopathy, no carotid bruit, no JVD, supple, symmetrical, trachea midline and thyroid not enlarged, symmetric, no tenderness/mass/nodules Lungs: Clear.No adventitial sounds Heart: regular rate and rhythm, S1, S2 normal, no murmur, click, rub or gallop Abdomen: soft, non-tender; bowel sounds normal; no masses,  no organomegaly Extremities: extremities normal, atraumatic, no cyanosis or edema Skin: Skin color, texture, turgor normal. No rashes or lesions  Lab Results: Results for orders placed during the hospital encounter of 02/16/11 (from the past 48 hour(s))  POCT I-STAT 3, BLOOD GAS (G3+)     Status: Abnormal   Collection Time   02/17/11 12:02 AM      Component Value Range Comment   pH, Arterial 7.321 (*) 7.350 - 7.400     pCO2 arterial 60.2 (*) 35.0 - 45.0 (mmHg)    pO2, Arterial 46.0 (*) 80.0 - 100.0 (mmHg)    Bicarbonate 31.1 (*) 20.0 - 24.0 (mEq/L)    TCO2 33  0 - 100 (mmol/L)    O2 Saturation 77.0      Acid-Base Excess 3.0 (*) 0.0 - 2.0 (mmol/L)    Patient temperature 98.6 F      Collection site RADIAL, ALLEN'S TEST ACCEPTABLE      Drawn by Operator      Sample type ARTERIAL      Comment NOTIFIED PHYSICIAN     CBC     Status: Normal   Collection Time   02/17/11 12:16 AM      Component Value  Range Comment   WBC 6.9  4.0 - 10.5 (K/uL)    RBC 4.59  3.87 - 5.11 (MIL/uL)    Hemoglobin 13.9  12.0 - 15.0 (g/dL)    HCT 96.2  95.2 - 84.1 (%)    MCV 95.6  78.0 - 100.0 (fL)    MCH 30.3  26.0 - 34.0 (pg)    MCHC 31.7  30.0 - 36.0 (g/dL)    RDW 32.4  40.1 - 02.7 (%)    Platelets 195  150 - 400 (K/uL)   DIFFERENTIAL     Status: Normal   Collection Time   02/17/11 12:16 AM      Component Value Range Comment   Neutrophils Relative 72  43 - 77 (%)    Neutro Abs 5.0  1.7 - 7.7 (K/uL)    Lymphocytes Relative 20  12 - 46 (%)    Lymphs Abs 1.4  0.7 - 4.0 (K/uL)    Monocytes Relative 7  3 - 12 (%)    Monocytes Absolute 0.5  0.1 - 1.0 (K/uL)    Eosinophils Relative 2  0 - 5 (%)    Eosinophils Absolute 0.1  0.0 - 0.7 (K/uL)    Basophils Relative  0  0 - 1 (%)    Basophils Absolute 0.0  0.0 - 0.1 (K/uL)   COMPREHENSIVE METABOLIC PANEL     Status: Abnormal   Collection Time   02/17/11 12:16 AM      Component Value Range Comment   Sodium 138  135 - 145 (mEq/L)    Potassium 4.4  3.5 - 5.1 (mEq/L)    Chloride 100  96 - 112 (mEq/L)    CO2 25  19 - 32 (mEq/L)    Glucose, Bld 139 (*) 70 - 99 (mg/dL)    BUN 21  6 - 23 (mg/dL)    Creatinine, Ser 1.61 (*) 0.50 - 1.10 (mg/dL)    Calcium 09.6  8.4 - 10.5 (mg/dL)    Total Protein 8.2  6.0 - 8.3 (g/dL)    Albumin 4.0  3.5 - 5.2 (g/dL)    AST 25  0 - 37 (U/L)    ALT 10  0 - 35 (U/L)    Alkaline Phosphatase 92  39 - 117 (U/L)    Total Bilirubin 0.5  0.3 - 1.2 (mg/dL)    GFR calc non Af Amer 45 (*) >90 (mL/min)    GFR calc Af Amer 52 (*) >90 (mL/min)   CARDIAC PANEL(CRET KIN+CKTOT+MB+TROPI)     Status: Abnormal   Collection Time   02/17/11 12:17 AM      Component Value Range Comment   Total CK 239 (*) 7 - 177 (U/L)    CK, MB 5.3 (*) 0.3 - 4.0 (ng/mL)    Troponin I <0.30  <0.30 (ng/mL)    Relative Index 2.2  0.0 - 2.5    PRO B NATRIURETIC PEPTIDE     Status: Abnormal   Collection Time   02/17/11 12:17 AM      Component Value Range Comment    Pro B Natriuretic peptide (BNP) 1996.0 (*) 0 - 125 (pg/mL)   URINE RAPID DRUG SCREEN (HOSP PERFORMED)     Status: Normal   Collection Time   02/17/11  4:20 AM      Component Value Range Comment   Opiates NONE DETECTED  NONE DETECTED     Cocaine NONE DETECTED  NONE DETECTED     Benzodiazepines NONE DETECTED  NONE DETECTED     Amphetamines NONE DETECTED  NONE DETECTED     Tetrahydrocannabinol NONE DETECTED  NONE DETECTED     Barbiturates NONE DETECTED  NONE DETECTED      Micro Results: Recent Results (from the past 240 hour(s))  CULTURE, BLOOD (ROUTINE X 2)     Status: Normal (Preliminary result)   Collection Time   02/16/11 11:50 PM      Component Value Range Status Comment   Specimen Description BLOOD RIGHT ARM   Final    Special Requests BOTTLES DRAWN AEROBIC ONLY 1CC   Final    Setup Time [id number]   Final    Culture     Final    Value:        BLOOD CULTURE RECEIVED NO GROWTH TO DATE CULTURE WILL BE HELD FOR 5 DAYS BEFORE ISSUING A FINAL NEGATIVE REPORT   Report Status PENDING   Incomplete   CULTURE, BLOOD (ROUTINE X 2)     Status: Normal (Preliminary result)   Collection Time   02/17/11 12:10 AM      Component Value Range Status Comment   Specimen Description BLOOD LEFT HAND   Final    Special Requests     Final  Value: BOTTLES DRAWN AEROBIC AND ANAEROBIC 3CC BLUE, 1CC RED   Setup Time [id number]   Final    Culture     Final    Value:        BLOOD CULTURE RECEIVED NO GROWTH TO DATE CULTURE WILL BE HELD FOR 5 DAYS BEFORE ISSUING A FINAL NEGATIVE REPORT   Report Status PENDING   Incomplete     Studies/Results: Ct Angio Chest W/cm &/or Wo Cm  02/17/2011  *RADIOLOGY REPORT*  Clinical Data:  Shortness of breath; cocaine abuse.  Recent diagnosis of breast cancer.  CT ANGIOGRAPHY CHEST WITH CONTRAST  Technique:  Multidetector CT imaging of the chest was performed using the standard protocol during bolus administration of intravenous contrast.  Multiplanar CT image  reconstructions including MIPs were obtained to evaluate the vascular anatomy.  Contrast: 80mL OMNIPAQUE IOHEXOL 350 MG/ML IV SOLN  Comparison:  Chest radiograph performed earlier today at 12:23 a.m., and CT of the chest performed 07/11/2010  Findings:  There is no evidence of pulmonary embolus.  Prominent blebs are noted at the lung apices.  Diffuse emphysema is noted bilaterally, particularly at the upper lung lobes.  Scarring or atelectasis is noted at the lung bases.  There is no evidence of significant focal consolidation, pleural effusion or pneumothorax. No masses are identified; no abnormal focal contrast enhancement is seen.  Diffuse coronary artery calcifications are noted.  The mediastinum is otherwise unremarkable in appearance.  No mediastinal lymphadenopathy is seen.  Scattered calcification is noted along the origins of the great vessels; the great vessels are otherwise grossly unremarkable in appearance.  There is filling of prominent collateral vessels along the right chest wall.  No axillary lymphadenopathy is seen.  The thyroid gland is unremarkable in appearance.  [name] known left-sided breast cancer is again characterized; it has increased in size, now measuring 1.8 cm, with minimal calcification.  The visualized portions of the liver and spleen are unremarkable. The visualized portions of the pancreas, stomach, adrenal glands and kidneys are within normal limits.  Diffuse calcification is noted along the proximal abdominal aorta.  No acute osseous abnormalities are seen.  Review of the MIP images confirms the above findings.  IMPRESSION:  1.  No evidence of pulmonary embolus. 2.  Diffuse bilateral emphysema, particularly at the upper lung lobes, with prominent blebs at the apices. 3.  Scarring or atelectasis at the lung bases. 4.  Diffuse coronary artery calcifications seen.  5.  Diffuse calcification along the proximal abdominal aorta. 6.  [name] known left-sided breast cancer  has increased in size to 1.8 cm, on comparison with the prior CT from May 2012.  Original Report Authenticated By: Tonia Ghent, M.D.   US Breast Left  01/28/2011  *RADIOLOGY REPORT*  Clinical Data:  Patient presents for additional views of the left breast as follow-up to a screening exam suggesting a possible mass.  DIGITAL DIAGNOSTIC LEFT MAMMOGRAM  AND LEFT BREAST ULTRASOUND:  Comparison:  Screening mammogram 11/25/2010  Findings:  Spot compression images demonstrate an ill-defined 2 cm mass with associated microcalcifications over the upper outer left breast.  On physical exam, there is a firm tender 2 cm mass over the 2 o'clock position of the left breast 10 cm from the nipple.  Ultrasound is performed, showing a hypoechoic somewhat heterogeneous lobulated mass at the 2 o'clock position of the left breast 10 cm from the nipple measuring 1.4 x 1.9 x 2.2 cm. Ultrasound examination over the left axillary region demonstrates no evidence  of abnormal appearing lymph nodes.  IMPRESSION: Hypoechoic heterogeneous lobulated mass at the 2 o'clock position of the left breast 10 cm from the nipple with associated microcalcifications measuring 1.4 x 1.9 x 2.2 cm. This is suspicious for a neoplastic process.  Recommendations: Recommend ultrasound-guided core needle biopsy of this suspicious abnormality.  Biopsy will be performed today.  BI-RADS CATEGORY 4:  Suspicious abnormality - biopsy should be considered.  Original Report Authenticated By: [id number]   [place] Core Biopsy  01/31/2011  **ADDENDUM** CREATED: 01/31/2011 12:12:00  Final pathology demonstrates INVASIVE DUCTAL CARCINOMA. Histology correlates with imaging findings.  [name] sister, [name], was contacted by phone on 01/31/2011 and these results given to her which she understood. Her questions were answered. The patient had no complaints with her biopsy site, per her sister.  Recommend surgical evaluation of the left breast.  An appointment with Dr.  Luisa Hart [facility]) has been scheduled for 02/10/2011 and the patient's sister informed.  Consider bilateral breast MRI depending on patient ability to tolerate the study.  Addended by:  Elpidio Eric. Si Gaul, M.D. on 01/31/2011 12:12:00.  **END ADDENDUM** SIGNED BY: Tinnie Gens T. Si Gaul, M.D.   01/31/2011  *RADIOLOGY REPORT*  Clinical Data:  New heterogeneous lobulated hypoechoic solid mass with associated microcalcifications at the 2 o'clock position of the left breast 10 cm from the nipple measuring 2.2 cm in greatest diameter.  ULTRASOUND GUIDED VACUUM ASSISTED CORE BIOPSY OF THE LEFT BREAST  I met with the patient, and we discussed the procedure of ultrasound-guided biopsy, including benefits and alternatives.  We discussed the high likelihood of a successful procedure. We discussed the risks of the procedure, including infection, bleeding, tissue injury, clip migration, and inadequate sampling.  Informed, written consent was given. Using sterile technique, 2% lidocaine, ultrasound guidance, and a 12 gauge vacuum assisted needle, biopsy was performed of the targeted mass at the 2 o'clock position of the left breast.  At the conclusion of the procedure, a tissue marker clip was deployed into the biopsy cavity. A follow-up sonographic image demonstrates the clip within the biopsied mass.  Post clip mammographic images were not obtained due to patient's debilitated physical and mental status and extreme soreness at the biopsy site.  IMPRESSION: Ultrasound-guided biopsy of the targeted suspicious mass at the 2 o'clock position of the left breast.  No apparent complications.  Original Report Authenticated By: Rosendo Gros, M.D.   Dg Chest Portable 1 View  02/17/2011  *RADIOLOGY REPORT*  Clinical Data: Acute onset of shortness of breath.  PORTABLE CHEST - 1 VIEW  Comparison: Chest radiograph performed 08/03/2010  Findings: The lungs are well-aerated.  Mild bibasilar opacities likely reflect atelectasis.  There  is no evidence of pleural effusion or pneumothorax.  The cardiomediastinal silhouette is mildly enlarged.  No acute osseous abnormalities are seen.  Multiple healed left-sided rib fractures are noted.  IMPRESSION: Mild bibasilar airspace opacities likely reflect atelectasis; mild cardiomegaly noted.  Original Report Authenticated By: Tonia Ghent, M.D.   Mm Digital Diag Ltd L  01/28/2011  *RADIOLOGY REPORT*  Clinical Data:  Patient presents for additional views of the left breast as follow-up to a screening exam suggesting a possible mass.  DIGITAL DIAGNOSTIC LEFT MAMMOGRAM  AND LEFT BREAST ULTRASOUND:  Comparison:  Screening mammogram 11/25/2010  Findings:  Spot compression images demonstrate an ill-defined 2 cm mass with associated microcalcifications over the upper outer left breast.  On physical exam, there is a firm tender 2 cm mass over the 2 o'clock position  of the left breast 10 cm from the nipple.  Ultrasound is performed, showing a hypoechoic somewhat heterogeneous lobulated mass at the 2 o'clock position of the left breast 10 cm from the nipple measuring 1.4 x 1.9 x 2.2 cm. Ultrasound examination over the left axillary region demonstrates no evidence of abnormal appearing lymph nodes.  IMPRESSION: Hypoechoic heterogeneous lobulated mass at the 2 o'clock position of the left breast 10 cm from the nipple with associated microcalcifications measuring 1.4 x 1.9 x 2.2 cm. This is suspicious for a neoplastic process.  Recommendations: Recommend ultrasound-guided core needle biopsy of this suspicious abnormality.  Biopsy will be performed today.  BI-RADS CATEGORY 4:  Suspicious abnormality - biopsy should be considered.  Original Report Authenticated By: [id number]   Medications: Scheduled Meds:   . albuterol  2.5 mg Nebulization Q6H  . albuterol  10 mg/hr Nebulization Once  . aspirin  81 mg Oral Daily  . budesonide  0.5 mg Nebulization BID  . carvedilol  3.125 mg Oral BID WC  . clopidogrel  75 mg Oral  Daily  . docusate sodium  100 mg Oral BID  . furosemide  40 mg Oral Daily  . ipratropium  0.5 mg Nebulization Once  . ipratropium  0.5 mg Nebulization Q6H  . isosorbide mononitrate  30 mg Oral Daily  . magnesium sulfate 1 - 4 g bolus IVPB  2 g Intravenous To Major  . methylPREDNISolone (SOLU-MEDROL) injection  125 mg Intravenous Once  . methylPREDNISolone (SOLU-MEDROL) injection  40 mg Intravenous Q12H  . moxifloxacin  400 mg Intravenous Q24H  . OLANZapine  2.5 mg Oral QHS  . pantoprazole  40 mg Oral Q1200  . QUEtiapine  50 mg Oral QHS  . simvastatin  10 mg Oral QHS  . sodium chloride  3 mL Intravenous Q12H  . traZODone  50 mg Oral QHS  . DISCONTD: albuterol  2.5 mg Nebulization Q4H  . DISCONTD: budesonide  0.5 mg Nebulization BID  . DISCONTD: ipratropium  0.5 mg Nebulization Q4H   Continuous Infusions:  PRN Meds:.acetaminophen, acetaminophen, albuterol, nitroGLYCERIN, ondansetron (ZOFRAN) IV, ondansetron  Assessment/Plan: #1 COPD EXACERBATION-will continue breathing treatment. #2 CHRONIC RESPIRATORY FAILURE-will continue 02 via nasal canulla #3 SCHIZOPHRENIA-will continue meds #4 HX OF SUBSTANCE ABUSE #5  HYPERTENSION, BENIGN ESSENTIAL-Bp fairly stable #6  CHF-not acutely decompensating #7  CAD-will continue plavix For d/c home on 02/19/2011  LOS: 2 days   Yosgart Pavey 02/18/2011, 6:22 PM

## 2011-02-19 LAB — COMPREHENSIVE METABOLIC PANEL
Albumin: 3.3 g/dL — ABNORMAL LOW (ref 3.5–5.2)
Alkaline Phosphatase: 78 U/L (ref 39–117)
BUN: 28 mg/dL — ABNORMAL HIGH (ref 6–23)
Chloride: 103 mEq/L (ref 96–112)
Creatinine, Ser: 1.18 mg/dL — ABNORMAL HIGH (ref 0.50–1.10)
GFR calc Af Amer: 55 mL/min — ABNORMAL LOW (ref >90)
GFR calc non Af Amer: 47 mL/min — ABNORMAL LOW (ref >90)
Glucose, Bld: 125 mg/dL — ABNORMAL HIGH (ref 70–99)
Total Bilirubin: 0.3 mg/dL (ref 0.3–1.2)

## 2011-02-19 LAB — DIFFERENTIAL
Basophils Relative: 0 % (ref 0–1)
Lymphs Abs: 0.8 10*3/uL (ref 0.7–4.0)
Monocytes Absolute: 0.7 10*3/uL (ref 0.1–1.0)
Monocytes Relative: 9 % (ref 3–12)
Neutro Abs: 6.5 10*3/uL (ref 1.7–7.7)

## 2011-02-19 LAB — CBC
HCT: 34.4 % — ABNORMAL LOW (ref 36.0–46.0)
Hemoglobin: 11.2 g/dL — ABNORMAL LOW (ref 12.0–15.0)
MCH: 30.8 pg (ref 26.0–34.0)
MCHC: 32.6 g/dL (ref 30.0–36.0)
RBC: 3.64 MIL/uL — ABNORMAL LOW (ref 3.87–5.11)

## 2011-02-19 LAB — CARDIAC PANEL(CRET KIN+CKTOT+MB+TROPI)
CK, MB: 2.9 ng/mL (ref 0.3–4.0)
Total CK: 130 U/L (ref 7–177)

## 2011-02-19 MED ORDER — BUDESONIDE-FORMOTEROL FUMARATE 80-4.5 MCG/ACT IN AERO: 2.0000 | INHALATION_SPRAY | Freq: Two times a day (BID) | RESPIRATORY_TRACT | Status: DC

## 2011-02-19 MED ORDER — OLMESARTAN MEDOXOMIL 5 MG PO TABS: 5.0000 mg | ORAL_TABLET | Freq: Every day | ORAL | Status: DC

## 2011-02-19 MED ORDER — LEVOFLOXACIN 250 MG PO TABS: 250.0000 mg | ORAL_TABLET | Freq: Every day | ORAL | Status: AC

## 2011-02-19 MED ORDER — DIPHENHYDRAMINE HCL 25 MG PO CAPS
25.0000 mg | ORAL_CAPSULE | Freq: Four times a day (QID) | ORAL | Status: DC | PRN
  Administered 2011-02-19: 25 mg via ORAL
  Filled 2011-02-19: qty 1

## 2011-02-19 MED ORDER — DIPHENHYDRAMINE HCL 25 MG PO CAPS: 25.0000 mg | ORAL_CAPSULE | Freq: Four times a day (QID) | ORAL | Status: AC | PRN

## 2011-02-19 NOTE — Progress Notes (Signed)
Spoke with Daphane Shepherd  of [facility] hospitalist  And informed of pt vital signs bp 123/75 hr 51 SB inform that pt had dropped  down to 49 non sustained. Pt scheduled to received 3.125 coreg po was ordered to administer will continue to monitor. Ilean Skill LPN

## 2011-02-19 NOTE — Discharge Summary (Signed)
DISCHARGE SUMMARY  [name]  MR#: [id number]  DOB:10-03-1945  Date of Admission: 02/16/2011 Date of Discharge: 02/19/2011  Attending Physician:Yared Barefoot  Patient's WUJ:WJXBJYN,WGNFA N, MD, MD  Consults:   Discharge Diagnoses: #1 COPD exacerbation #2 chronic respiratory failure on home O2 #3 history of congestive heart failure( chronic systole dysfunction) #4 history of breast CA. #5 hypertension #6 history of substance abuse i.e. cocaine #7 schizophrenia #8 history of asthma #9 dementia #10 history of coronary artery disease. #11 mild renal insufficiency(stage II) #12 hyperlipidemia  Present on Admission:  .COPD .SCHIZOPHRENIA .HYPERTENSION, BENIGN ESSENTIAL .COCAINE ABUSE .CHF    Current Discharge Medication List    START taking these medications   Details  diphenhydrAMINE (BENADRYL) 25 mg capsule Take 1 capsule (25 mg total) by mouth every 6 (six) hours as needed for itching. Qty: 30 capsule, Refills: 1    levofloxacin (LEVAQUIN) 250 MG tablet Take 1 tablet (250 mg total) by mouth daily. Qty: 3 tablet, Refills: 0    olmesartan (BENICAR) 5 MG tablet Take 1 tablet (5 mg total) by mouth daily. Qty: 30 tablet, Refills: 1      CONTINUE these medications which have CHANGED   Details  budesonide-formoterol (SYMBICORT) 80-4.5 MCG/ACT inhaler Inhale 2 puffs into the lungs 2 (two) times daily. Qty: 1 Inhaler, Refills: 1      CONTINUE these medications which have NOT CHANGED   Details  albuterol (PROVENTIL) (2.5 MG/3ML) 0.083% nebulizer solution Take 2.5 mg by nebulization every 6 (six) hours as needed. For wheezing    albuterol (PROVENTIL) 90 MCG/ACT inhaler Inhale 2 puffs into the lungs every 6 (six) hours as needed. For wheezing     aspirin 81 MG tablet Take 81 mg by mouth daily.      carvedilol (COREG) 3.125 MG tablet Take 3.125 mg by mouth 2 (two) times daily with a meal.      clopidogrel (PLAVIX) 75 MG tablet Take 75 mg by mouth  daily.      docusate sodium (COLACE) 100 MG capsule Take 100 mg by mouth 2 (two) times daily.      furosemide (LASIX) 40 MG tablet Take 40 mg by mouth daily.      isosorbide mononitrate (IMDUR) 30 MG 24 hr tablet Take 30 mg by mouth daily.      OLANZapine (ZYPREXA) 2.5 MG tablet Take 2.5 mg by mouth at bedtime.      omeprazole (PRILOSEC) 20 MG capsule Take 20 mg by mouth daily.      QUEtiapine (SEROQUEL) 50 MG tablet Take 50 mg by mouth at bedtime.      simvastatin (ZOCOR) 10 MG tablet Take 10 mg by mouth at bedtime.      traZODone (DESYREL) 50 MG tablet Take 50 mg by mouth at bedtime.      nitroGLYCERIN (NITROSTAT) 0.4 MG SL tablet Place 0.4 mg under the tongue every 5 (five) minutes as needed.      tiotropium (SPIRIVA) 18 MCG inhalation capsule Place 18 mcg into inhaler and inhale daily.            Hospital Course: Patient is a 65 year old African American female with history of congestive heart failure, chronic systolic dysfunction, COPD on home O2 was admitted to the hospital on 02/16/2011 with complains of increasing shortness of breath. No history of chest pain. No history of fever, chills or Rigors. Examination of patient lung showed widespread rhonchi and subsequently admitted for stabilization. Patient was admitted to telemetry. She was given O2 via nasal cannula.  She was started on nebulizer treatment. She was also given IV Solu-Medrol as well as  Levaquin. Patient was also restarted on Lasix. She was also given Imdur as well as Plavix and Coreg for her history of coronary artery disease. Also added to patient's regimen was aspirin. Patient was also restarted on her antipsychotic medication in this include Zyprexa, Seroquel as well as trazodone. GI prophylaxis was done with Protonix and DVT prophylaxis with TED hose. Patient was followed and evaluated by me on daily basis, and she made remarkable progress. She ever complain about itchy and and Benadryl was added to regimen, and  this resolved slowly. Patient is back to her baseline functioning. Denies any specific complaints. Examination of the lungs was unremarkable. Vital signs are stable. Plan is for patient to be discharge home today.   Day of Discharge BP 123/75  Pulse 56  Temp(Src) 97.6 F (36.4 C) (Oral)  Resp 16  Ht 5\' 1"  (1.549 m)  Wt 45.6 kg (100 lb 8.5 oz)  BMI 18.99 kg/m2  SpO2 96%  Physical Exam:vitals as above heent-pallor Neck-no jvd Chest-clear cvs-s1 and s2 Abd-soft,scaphoid and non tender,no organs palpable and bowel sounds are present Ext-no pedal edema Neuro-non focal Skin-scratch marks Neuro-psch-appropriate affect    Results for orders placed during the hospital encounter of 02/16/11 (from the past 24 hour(s))  CARDIAC PANEL(CRET KIN+CKTOT+MB+TROPI)     Status: Normal   Collection Time   02/18/11  4:25 PM      Component Value Range   Total CK 168  7 - 177 (U/L)   CK, MB 3.9  0.3 - 4.0 (ng/mL)   Troponin I <0.30  <0.30 (ng/mL)   Relative Index 2.3  0.0 - 2.5   CARDIAC PANEL(CRET KIN+CKTOT+MB+TROPI)     Status: Normal   Collection Time   02/18/11 11:52 PM      Component Value Range   Total CK 130  7 - 177 (U/L)   CK, MB 2.9  0.3 - 4.0 (ng/mL)   Troponin I <0.30  <0.30 (ng/mL)   Relative Index 2.2  0.0 - 2.5   CBC     Status: Abnormal   Collection Time   02/19/11  6:00 AM      Component Value Range   WBC 8.2  4.0 - 10.5 (K/uL)   RBC 3.64 (*) 3.87 - 5.11 (MIL/uL)   Hemoglobin 11.2 (*) 12.0 - 15.0 (g/dL)   HCT 45.4 (*) 09.8 - 46.0 (%)   MCV 94.5  78.0 - 100.0 (fL)   MCH 30.8  26.0 - 34.0 (pg)   MCHC 32.6  30.0 - 36.0 (g/dL)   RDW 11.9  14.7 - 82.9 (%)   Platelets 181  150 - 400 (K/uL)  DIFFERENTIAL     Status: Abnormal   Collection Time   02/19/11  6:00 AM      Component Value Range   Neutrophils Relative 80 (*) 43 - 77 (%)   Neutro Abs 6.5  1.7 - 7.7 (K/uL)   Lymphocytes Relative 9 (*) 12 - 46 (%)   Lymphs Abs 0.8  0.7 - 4.0 (K/uL)   Monocytes Relative 9  3  - 12 (%)   Monocytes Absolute 0.7  0.1 - 1.0 (K/uL)   Eosinophils Relative 2  0 - 5 (%)   Eosinophils Absolute 0.2  0.0 - 0.7 (K/uL)   Basophils Relative 0  0 - 1 (%)   Basophils Absolute 0.0  0.0 - 0.1 (K/uL)  COMPREHENSIVE METABOLIC PANEL  Status: Abnormal   Collection Time   02/19/11  6:00 AM      Component Value Range   Sodium 141  135 - 145 (mEq/L)   Potassium 3.5  3.5 - 5.1 (mEq/L)   Chloride 103  96 - 112 (mEq/L)   CO2 31  19 - 32 (mEq/L)   Glucose, Bld 125 (*) 70 - 99 (mg/dL)   BUN 28 (*) 6 - 23 (mg/dL)   Creatinine, Ser 1.61 (*) 0.50 - 1.10 (mg/dL)   Calcium 9.2  8.4 - 09.6 (mg/dL)   Total Protein 7.0  6.0 - 8.3 (g/dL)   Albumin 3.3 (*) 3.5 - 5.2 (g/dL)   AST 18  0 - 37 (U/L)   ALT 10  0 - 35 (U/L)   Alkaline Phosphatase 78  39 - 117 (U/L)   Total Bilirubin 0.3  0.3 - 1.2 (mg/dL)   GFR calc non Af Amer 47 (*) >90 (mL/min)   GFR calc Af Amer 55 (*) >90 (mL/min)    Disposition: stable   Follow-up Appts: Discharge Orders    Future Appointments: Provider: Department: Dept Phone: Center:   02/23/2011 10:30 AM [facility] [phone] None   02/23/2011 11:00 AM Jonna Coup, MD [facility] [phone] None   03/01/2011 4:00 PM Gwenith Spitz [facility] [phone] None   03/01/2011 4:00 PM [facility] Financial Counselor [facility] [phone] None   03/01/2011 4:30 PM Pierce Crane, MD [facility] [phone] None   03/11/2011 10:50 AM Maisie Fus A. Cornett, MD [facility] [phone] None       Signed: Talmage Nap 02/19/2011, 10:26 AM

## 2011-02-23 ENCOUNTER — Ambulatory Visit: Payer: Medicare Other | Admitting: Radiation Oncology

## 2011-02-23 ENCOUNTER — Ambulatory Visit: Admission: RE | Admit: 2011-02-23 | Payer: Medicare Other | Source: Ambulatory Visit

## 2011-02-23 LAB — CULTURE, BLOOD (ROUTINE X 2)
Culture  Setup Time: 201212270817
Culture: NO GROWTH

## 2011-02-25 ENCOUNTER — Encounter: Payer: Self-pay | Admitting: *Deleted

## 2011-02-28 ENCOUNTER — Ambulatory Visit
Admission: RE | Admit: 2011-02-28 | Discharge: 2011-02-28 | Disposition: A | Discharge status: Home / Self Care | Payer: Medicare Other | Source: Ambulatory Visit | Attending: Radiation Oncology | Admitting: Radiation Oncology

## 2011-02-28 ENCOUNTER — Encounter: Payer: Self-pay | Admitting: Radiation Oncology

## 2011-02-28 VITALS — BP 114/79 | HR 61 | Temp 97.0°F | Resp 20 | Wt 104.7 lb

## 2011-02-28 DIAGNOSIS — C50419 Malignant neoplasm of upper-outer quadrant of unspecified female breast: Secondary | ICD-10-CM | POA: Insufficient documentation

## 2011-02-28 DIAGNOSIS — Z79899 Other long term (current) drug therapy: Secondary | ICD-10-CM | POA: Insufficient documentation

## 2011-02-28 DIAGNOSIS — I509 Heart failure, unspecified: Secondary | ICD-10-CM | POA: Insufficient documentation

## 2011-02-28 DIAGNOSIS — G4733 Obstructive sleep apnea (adult) (pediatric): Secondary | ICD-10-CM | POA: Insufficient documentation

## 2011-02-28 DIAGNOSIS — J449 Chronic obstructive pulmonary disease, unspecified: Secondary | ICD-10-CM | POA: Insufficient documentation

## 2011-02-28 DIAGNOSIS — Z87891 Personal history of nicotine dependence: Secondary | ICD-10-CM | POA: Insufficient documentation

## 2011-02-28 DIAGNOSIS — Z8673 Personal history of transient ischemic attack (TIA), and cerebral infarction without residual deficits: Secondary | ICD-10-CM | POA: Insufficient documentation

## 2011-02-28 DIAGNOSIS — J4489 Other specified chronic obstructive pulmonary disease: Secondary | ICD-10-CM | POA: Insufficient documentation

## 2011-02-28 DIAGNOSIS — I10 Essential (primary) hypertension: Secondary | ICD-10-CM | POA: Insufficient documentation

## 2011-02-28 DIAGNOSIS — F0390 Unspecified dementia without behavioral disturbance: Secondary | ICD-10-CM | POA: Insufficient documentation

## 2011-02-28 DIAGNOSIS — Z809 Family history of malignant neoplasm, unspecified: Secondary | ICD-10-CM | POA: Insufficient documentation

## 2011-02-28 DIAGNOSIS — C50919 Malignant neoplasm of unspecified site of unspecified female breast: Secondary | ICD-10-CM

## 2011-02-28 DIAGNOSIS — I2510 Atherosclerotic heart disease of native coronary artery without angina pectoris: Secondary | ICD-10-CM | POA: Insufficient documentation

## 2011-02-28 NOTE — Progress Notes (Signed)
Please see the Nurse Progress Note in the MD Initial Consult Encounter for this patient. 

## 2011-02-28 NOTE — Progress Notes (Signed)
Pt and 2 sisters state pt had L breast surgery as teen, "came home from hospital w/balloon in breast". They are unsure of the diagnosis.

## 2011-03-01 ENCOUNTER — Ambulatory Visit: Payer: Medicare Other

## 2011-03-01 ENCOUNTER — Ambulatory Visit (HOSPITAL_BASED_OUTPATIENT_CLINIC_OR_DEPARTMENT_OTHER): Payer: Medicare Other | Admitting: Oncology

## 2011-03-01 ENCOUNTER — Other Ambulatory Visit: Payer: Medicare Other

## 2011-03-01 VITALS — BP 126/74 | HR 57 | Temp 98.2°F | Ht 61.0 in | Wt 101.9 lb

## 2011-03-01 DIAGNOSIS — C50919 Malignant neoplasm of unspecified site of unspecified female breast: Secondary | ICD-10-CM

## 2011-03-01 DIAGNOSIS — E559 Vitamin D deficiency, unspecified: Secondary | ICD-10-CM

## 2011-03-01 LAB — CBC WITH DIFFERENTIAL/PLATELET
BASO%: 0.3 % (ref 0.0–2.0)
Eosinophils Absolute: 0.1 10*3/uL (ref 0.0–0.5)
MCHC: 33 g/dL (ref 31.5–36.0)
MONO#: 0.3 10*3/uL (ref 0.1–0.9)
NEUT#: 4 10*3/uL (ref 1.5–6.5)
Platelets: 194 10*3/uL (ref 145–400)
RBC: 4.28 10*6/uL (ref 3.70–5.45)
RDW: 14.5 % (ref 11.2–14.5)
WBC: 5.3 10*3/uL (ref 3.9–10.3)
lymph#: 0.9 10*3/uL (ref 0.9–3.3)

## 2011-03-01 LAB — CANCER ANTIGEN 27.29: CA 27.29: 39 U/mL (ref 0–39)

## 2011-03-01 MED ORDER — LETROZOLE 2.5 MG PO TABS: 2.5000 mg | ORAL_TABLET | Freq: Every day | ORAL | Status: AC

## 2011-03-01 NOTE — Progress Notes (Signed)
Patient History and Physical   AKAILA RAMBO [id number] 10-12-1945 66 y.o. 03/01/2011  CC: Dr tom Cornett; [facility]; dr Sharyn Lull; dr moody  Chief Complaint: Lt  Breast Mass  HPI:  This a 66 year old Afro-American woman who has not had a recent mammogram. She underwent a left mammogram left breast ultrasound on 01/28/2011 which showed a hypoechoic heterogeneous lobulated mass 2:00 position measuring 1.4 x 1.9 x 2.2 cm. Biopsy performed on 01/28/2011 showed this to be a grade 2 over ductal cancer, HER-2 negative I finished with a ratio 1.67, this was ER positive at 100%, PR +100% with a proliferative index of 15% she was referred to Dr. Luisa Hart who in turn has referred her to the medical oncology clinic as well. He has also been seen by radiation oncology. Really the issue at hand is her  large number of comorbid problems and whether she would be a candidate for surgery.  PMH: Past Medical History  Diagnosis Date  . Weight loss, unintentional   . Trouble swallowing   . Change in voice   . Substance abuse   . Dementia   . Asthma   . Shortness of breath   . Seizures     "    It has been along time "  . CHF (congestive heart failure)   . Headache   . Hypertension   . Arthralgia   . Tremors of nervous system   . Anxiety   . Rib fractures     hx of May 2012  . Heart attack 05/21/10  . Schizophrenia   . Coronary artery disease   . Stroke 07/11/10    hx of R CVA   . COPD (chronic obstructive pulmonary disease)   . Obstructive sleep apnea   . Breast cancer 01/28/11    L breast, inv ductal/in situ, ER/PR -, Her2 -    Past Surgical History  Procedure Date  . Total abdominal hysterectomy age 16    Allergies: Allergies  Allergen Reactions  . Codeine Itching    All over the body  . Penicillins Hives and Other (See Comments)    "Whelps" per patient.  . Hydralazine     02/28/11 Family unsure if this is allergy for pt    Medications: Medications Prior to Admission    Medication Sig Dispense Refill  . albuterol (PROVENTIL) (2.5 MG/3ML) 0.083% nebulizer solution Take 2.5 mg by nebulization every 6 (six) hours as needed. For wheezing      . albuterol (PROVENTIL) 90 MCG/ACT inhaler Inhale 2 puffs into the lungs every 6 (six) hours as needed. For wheezing       . aspirin 81 MG tablet Take 81 mg by mouth daily.        . budesonide-formoterol (SYMBICORT) 80-4.5 MCG/ACT inhaler Inhale 2 puffs into the lungs 2 (two) times daily.  1 Inhaler  1  . carvedilol (COREG) 3.125 MG tablet Take 3.125 mg by mouth 2 (two) times daily with a meal.        . clopidogrel (PLAVIX) 75 MG tablet Take 75 mg by mouth daily.        . diphenhydrAMINE (BENADRYL) 25 mg capsule Take 1 capsule (25 mg total) by mouth every 6 (six) hours as needed for itching.  30 capsule  1  . docusate sodium (COLACE) 100 MG capsule Take 100 mg by mouth 2 (two) times daily.        . furosemide (LASIX) 40 MG tablet Take 40 mg by mouth daily.        Marland Kitchen  isosorbide mononitrate (IMDUR) 30 MG 24 hr tablet Take 30 mg by mouth daily.        Marland Kitchen levofloxacin (LEVAQUIN) 250 MG tablet Take 1 tablet (250 mg total) by mouth daily.  3 tablet  0  . nitroGLYCERIN (NITROSTAT) 0.4 MG SL tablet Place 0.4 mg under the tongue every 5 (five) minutes as needed.        Marland Kitchen OLANZapine (ZYPREXA) 2.5 MG tablet Take 2.5 mg by mouth at bedtime.        Marland Kitchen olmesartan (BENICAR) 5 MG tablet Take 1 tablet (5 mg total) by mouth daily.  30 tablet  1  . omeprazole (PRILOSEC) 20 MG capsule Take 20 mg by mouth daily.        . QUEtiapine (SEROQUEL) 50 MG tablet Take 50 mg by mouth at bedtime.        . simvastatin (ZOCOR) 10 MG tablet Take 10 mg by mouth at bedtime.        Marland Kitchen tiotropium (SPIRIVA) 18 MCG inhalation capsule Place 18 mcg into inhaler and inhale daily.        . traZODone (DESYREL) 50 MG tablet Take 50 mg by mouth at bedtime.         No current facility-administered medications on file as of 03/01/2011.    Social History:   reports that she  quit smoking about 3 years ago. Her smoking use included Cigarettes. She has a 50 pack-year smoking history. She has never used smokeless tobacco. She reports that she drinks alcohol. She reports that she uses illicit drugs. She has previously used crack cocaine which has resulted in a number of strokes as well as myocardial infarctions.  Family History: Family History  Problem Relation Age of Onset  . Breast cancer Mother   . Birth defects Mother     breast  . Cancer Mother     breast  . Heart disease Father     heart attack  . Heart attack Father   . Heart attack Brother   . Cancer Brother     throat, lung  . Cancer Paternal Aunt   . Cancer Maternal Grandmother     breast     Reproductive History G2P1, menarche 16, s/p total hysterectomy; unknown hx of HRT.  Review of Systems: Constitutional ROS: Fever none, Chills, Night Sweats, Anorexia, Pain decreased weight unknown weight loss Cardiovascular ROS: no chest pain or dyspnea on exertion negative Respiratory ROS: no cough, shortness of breath, or wheezing Neurological ROS: negative Dermatological ROS: negative ENT ROS: negative Gastrointestinal ROS: negative Genito-Urinary ROS: negative Hematological and Lymphatic ROS: negative Breast ROS: positive for - new or changing breast lumps Musculoskeletal ROS: negative Remaining ROS negative.  Physical Exam: Blood pressure 126/74, pulse 57, temperature 98.2 F (36.8 C), height 5\' 1"  (1.549 m), weight 101 lb 14.4 oz (46.222 kg). General appearance: cachectic, distracted and no distress Head: Normocephalic, without obvious abnormality, atraumatic Neck: no adenopathy, no carotid bruit, no JVD, supple, symmetrical, trachea midline and thyroid not enlarged, symmetric, no tenderness/mass/nodules Lymph nodes: Cervical, supraclavicular, and axillary nodes normal. chest clear, no wheezing, rales, normal symmetric air entry, Heart exam - S1, S2 normal, no murmur, no gallop, rate  regular regular rate and rhythm, S1, S2 normal, no murmur, click, rub or gallop and prominent apical impulse abdomen is soft without significant tenderness, masses, organomegaly or guarding normal appearance, no masses or tenderness extremities normal, atraumatic, no cyanosis or edema Grossly normal Lt breast tender 2 cm breast mass , n adenopathy.  Lab Results: Lab Results  Component Value Date   WBC 5.3 03/01/2011   HGB 13.3 03/01/2011   HCT 40.1 03/01/2011   MCV 93.8 03/01/2011   PLT 194 03/01/2011     Chemistry      Component Value Date/Time   NA 141 02/19/2011 0600   K 3.5 02/19/2011 0600   CL 103 02/19/2011 0600   CO2 31 02/19/2011 0600   BUN 28* 02/19/2011 0600   CREATININE 1.18* 02/19/2011 0600      Component Value Date/Time   CALCIUM 9.2 02/19/2011 0600   ALKPHOS 78 02/19/2011 0600   AST 18 02/19/2011 0600   ALT 10 02/19/2011 0600   BILITOT 0.3 02/19/2011 0600       Radiological Studies: Per above  Impression and Plan:  Pleasant postmenopausal woman who presents with a T2 NX ER positive PR positive HER-2 negative locally advanced breast cancer. Here today with a number of siblings. She has a difficult historian and is quite vague. She has had number of strokes in the past and I suspect a lot of her cognitive difficulties related to that. There is apparent history of schizophrenia the chart as well. I did try to review the options with the patient and her family. One issue is that there are 3 generations of the women in this family who have had breast cancer and Ms. Mccardle is now a fourth. I have therefore recommended genetic testing. She has a severe Cardiomyopathy with the last estimated ejection fraction at approximately 20%, I suspect she is not   the best surgical candidate and so I have placed her on letrozole as well. I've discussed side effects with her. I've also asked her to go ahead and get a bone density test. I will plan to review these results with her family in 6  weeks' time. Suspect that she would be a good candidate for letrozole therapy will likely tolerate this and will be maintained on this for a long time.     Pierce Crane, MD 03/01/2011, 5:58 PM

## 2011-03-02 LAB — COMPREHENSIVE METABOLIC PANEL
ALT: 17 U/L (ref 0–35)
AST: 22 U/L (ref 0–37)
CO2: 31 mEq/L (ref 19–32)
Calcium: 10 mg/dL (ref 8.4–10.5)
Chloride: 102 mEq/L (ref 96–112)
Creatinine, Ser: 1.27 mg/dL — ABNORMAL HIGH (ref 0.50–1.10)
Potassium: 3.6 mEq/L (ref 3.5–5.3)
Sodium: 144 mEq/L (ref 135–145)
Total Protein: 8.2 g/dL (ref 6.0–8.3)

## 2011-03-03 NOTE — Progress Notes (Signed)
Encounter addended by: Glennie Hawk, RN on: 03/03/2011  8:18 AM<BR>     Documentation filed: Charges VN

## 2011-03-06 ENCOUNTER — Encounter: Payer: Self-pay | Admitting: Radiation Oncology

## 2011-03-06 NOTE — Progress Notes (Signed)
Encounter addended by: Jonna Coup, MD on: 03/06/2011  9:44 AM<BR>     Documentation filed: Visit Diagnoses

## 2011-03-07 NOTE — Progress Notes (Signed)
CC:   Thomas A. Cornett, M.D. Pierce Crane, M.D., F.R.C.P.C.  REFERRING PHYSICIAN:  Maisie Fus A. Cornett, M.D.  HISTORY OF PRESENT ILLNESS:  The patient is a 66 year old, African American female with a number of comorbidities.  She did undergo a left mammogram and left ultrasound on 01/28/2011, which did reveal a suspicious mass within the upper outer quadrant.  A biopsy of a 2.2 cm mass did reveal a grade 2 ductal carcinoma.  The receptor studies indicated that the tumor is ER positive, PR positive, and HER-2/neu negative.  The Ki-67 staining was 15%.  The patient has been referred to Dr. Luisa Hart, who has reviewed possible surgical options with her, and I have been asked by him to see Ms. Valvo today for review of a possible course of adjuvant radiation after surgery.  She is also scheduled to see Dr. Donnie Coffin later this week to review systemic treatment options.  The patient is accompanied today by her 2 sisters.  The patient is a poor historian and it is certainly helpful to have her sisters here with her today to help relay some information and to help in the patient's understanding.  PAST MEDICAL HISTORY: 1. History of substance abuse. 2. History of dementia. 3. Asthma. 4. History of seizures, remotely 5. Congestive heart failure. 6. Hypertension. 7. Arthralgia. 8. History of anxiety. 9. History of rib fractures in May of 2012. 10.Schizophrenia. 11.Coronary artery disease, status post stroke with a right CVA in May     2012. 12.COPD. 13.Obstructive sleep apnea. 14.Status post total abdominal hysterectomy.  CURRENT MEDICATIONS: 1. Proventil. 2. Symbicort. 3. Coreg. 4. Plavix. 5. Benadryl. 6. Colace. 7. Lasix. 8. Imdur. 9. Levaquin. 10.Nitroglycerin. 11.Zyprexa. 12.Benicar. 13.Prilosec. 14.Seroquel. 15.Zocor. 16.Spiriva. 17.Desyrel.  ALLERGIES: 1. CODEINE. 2. PENICILLIN. 3. HYDRALAZINE.  FAMILY HISTORY:  The patient indicates that her mother, grandmother  and great-grandmother suffered from breast cancer.  Her father, they state, had "throat and lung cancer."  SOCIAL HISTORY:  The patient indicates that she quit smoking approximately 3 years ago.  She has a 50 pack year history.  She does drink alcohol on an occasional basis.  She has a prior history of crack cocaine use.  REVIEW OF SYSTEMS:  Fully reviewed and documented in the medical chart.  PHYSICAL EXAMINATION:  Vital Signs:  Weight 104 pounds.  Blood pressure 114/79.  Pulse 61.  Respiratory rate 20.  Temperature 97.7.  General Appearance:  A well-developed female in no acute distress.  Alert and oriented x3.  HEENT:  Normocephalic atraumatic.  Pupils are equal, round, and reactive to light.  Extraocular movements are intact.  Oral cavity clear.  Neck:  No adenopathy present.  Cardiovascular:  Regular rate and rhythm.  Respiratory:  Clear to auscultation bilaterally. Breast Exam:  The patient was quite tender in the region of a palpable mass in the upper outer quadrant.  The patient stated she was quite tender and this hindered the exam.  No suspicious adenopathy present. No suspicious right-sided breast masses or right-sided axillary adenopathy.  GI:  Abdomen is soft, nontender.  Normal bowel sounds. Extremities:  No edema present.  IMPRESSION AND PLAN:  Ms. Rhude is a pleasant, 66 year old female with a recent diagnosis of invasive ductal carcinoma of the left breast, which clinically corresponds to a T2 N0 tumor.  The patient has discussed possible surgery with Dr. Luisa Hart and I discussed with the patient and her sisters a possible role for radiation in such a setting postoperatively.  We discussed the implications of the type  of surgery on typical radiation use with radiation after a lumpectomy being a standard typical recommendation.  If this represents a T2 tumor, then this would typically be my recommendation.  We discussed some of details of treatment including the fact  that this is an every day treatment for 6-1/2 weeks.  I did want to make sure that the patient was aware of this and her family was aware of this and felt that they could proceed with this if they were considering a lumpectomy, which I believe that they are.  Otherwise, this may impact on possible surgical decisions, but they indicated that they believe that this was potentially feasible. The patient certainly has a number of comorbidities and she is seeing Dr. Donnie Coffin later this week to review her case and to see how the systemic treatment may play into her overall treatment plan as well.  All of the patient's questions were answered and she did not have many, but her sisters certainly did have some questions.  We reviewed all of the relevant information with regards to a possible course of radiation.  I will follow along with her case and see what recommendations Dr. Donnie Coffin makes later this week.  If she proceeds with a lumpectomy, then we will schedule her to see me back postoperatively.    ______________________________ Radene Gunning, M.D., Ph.D. JSM/MEDQ  D:  03/06/2011  T:  03/07/2011  Job:  [id number]

## 2011-03-08 ENCOUNTER — Encounter: Payer: Self-pay | Admitting: *Deleted

## 2011-03-08 NOTE — Progress Notes (Signed)
Mailed after appt letter to pt. 

## 2011-03-11 ENCOUNTER — Encounter (INDEPENDENT_AMBULATORY_CARE_PROVIDER_SITE_OTHER): Payer: Medicare Other | Admitting: Surgery

## 2011-03-30 ENCOUNTER — Other Ambulatory Visit: Payer: Medicare Other

## 2011-03-31 ENCOUNTER — Other Ambulatory Visit: Payer: Medicare Other | Admitting: Lab

## 2011-03-31 ENCOUNTER — Telehealth: Payer: Self-pay | Admitting: *Deleted

## 2011-03-31 NOTE — Telephone Encounter (Signed)
Confirmed 04/18/11 appt w/ pt's sister Hale Bogus.

## 2011-04-05 ENCOUNTER — Ambulatory Visit
Admission: RE | Admit: 2011-04-05 | Discharge: 2011-04-05 | Disposition: A | Discharge status: Home / Self Care | Payer: Medicare Other | Source: Ambulatory Visit | Attending: Oncology | Admitting: Oncology

## 2011-04-05 DIAGNOSIS — E559 Vitamin D deficiency, unspecified: Secondary | ICD-10-CM

## 2011-04-05 DIAGNOSIS — C50919 Malignant neoplasm of unspecified site of unspecified female breast: Secondary | ICD-10-CM

## 2011-04-06 ENCOUNTER — Ambulatory Visit (HOSPITAL_BASED_OUTPATIENT_CLINIC_OR_DEPARTMENT_OTHER): Payer: Medicare Other | Admitting: Oncology

## 2011-04-06 ENCOUNTER — Encounter: Payer: Self-pay | Admitting: Oncology

## 2011-04-06 VITALS — BP 115/76 | HR 64 | Temp 97.6°F | Ht 61.0 in | Wt 102.0 lb

## 2011-04-06 DIAGNOSIS — C50919 Malignant neoplasm of unspecified site of unspecified female breast: Secondary | ICD-10-CM

## 2011-04-06 DIAGNOSIS — Z79811 Long term (current) use of aromatase inhibitors: Secondary | ICD-10-CM

## 2011-04-06 DIAGNOSIS — E559 Vitamin D deficiency, unspecified: Secondary | ICD-10-CM

## 2011-04-06 DIAGNOSIS — Z170 Estrogen receptor positive status [ER+]: Secondary | ICD-10-CM

## 2011-04-06 NOTE — Progress Notes (Signed)
Prolia will be covered with Medicare and Medicaid.

## 2011-04-06 NOTE — Progress Notes (Signed)
Patient History and Physical   [name] [id number] 1945-04-29 66 y.o. 04/06/2011  CC: Dr tom Cornett; [facility]; dr Sharyn Lull; dr moody  Chief Complaint: Lt  Breast Mass  HPI:  This a 66 year old Afro-American woman who has not had a recent mammogram. She underwent a left mammogram left breast ultrasound on 01/28/2011 which showed a hypoechoic heterogeneous lobulated mass 2:00 position measuring 1.4 x 1.9 x 2.2 cm. Biopsy performed on 01/28/2011 showed this to be a grade 2 over ductal cancer, HER-2 negative I finished with a ratio 1.67, this was ER positive at 100%, PR +100% with a proliferative index of 15% she was referred to Dr. Luisa Hart who in turn has referred her to the medical oncology clinic as well. He has also been seen by radiation oncology. Really the issue at hand is her  large number of comorbid problems and whether she would be a candidate for surgery. She has now been on letrozole for 6 weeks and is tolerating it well, she had a bone density test yesterday which shows significant osteperosis.  PMH: Past Medical History  Diagnosis Date  . Weight loss, unintentional   . Trouble swallowing   . Change in voice   . Substance abuse   . Dementia   . Asthma   . Shortness of breath   . Seizures     "    It has been along time "  . CHF (congestive heart failure)   . Headache   . Hypertension   . Arthralgia   . Tremors of nervous system   . Anxiety   . Rib fractures     hx of May 2012  . Heart attack 05/21/10  . Schizophrenia   . Coronary artery disease   . Stroke 07/11/10    hx of R CVA   . COPD (chronic obstructive pulmonary disease)   . Obstructive sleep apnea   . Breast cancer 01/28/11    L breast, inv ductal/in situ, ER/PR -, Her2 -    Past Surgical History  Procedure Date  . Total abdominal hysterectomy age 77    Allergies: Allergies  Allergen Reactions  . Codeine Itching    All over the body  . Penicillins Hives and Other (See Comments)   "Whelps" per patient.  . Hydralazine     02/28/11 Family unsure if this is allergy for pt    Medications: Medications Prior to Admission  Medication Sig Dispense Refill  . albuterol (PROVENTIL) (2.5 MG/3ML) 0.083% nebulizer solution Take 2.5 mg by nebulization every 6 (six) hours as needed. For wheezing      . albuterol (PROVENTIL) 90 MCG/ACT inhaler Inhale 2 puffs into the lungs every 6 (six) hours as needed. For wheezing       . aspirin 81 MG tablet Take 81 mg by mouth daily.        . budesonide-formoterol (SYMBICORT) 80-4.5 MCG/ACT inhaler Inhale 2 puffs into the lungs 2 (two) times daily.  1 Inhaler  1  . carvedilol (COREG) 3.125 MG tablet Take 3.125 mg by mouth 2 (two) times daily with a meal.        . clopidogrel (PLAVIX) 75 MG tablet Take 75 mg by mouth daily.        . furosemide (LASIX) 40 MG tablet Take 40 mg by mouth daily.        . isosorbide mononitrate (IMDUR) 30 MG 24 hr tablet Take 30 mg by mouth daily.        . nitroGLYCERIN (  NITROSTAT) 0.4 MG SL tablet Place 0.4 mg under the tongue every 5 (five) minutes as needed.        Marland Kitchen OLANZapine (ZYPREXA) 2.5 MG tablet Take 2.5 mg by mouth at bedtime.        Marland Kitchen omeprazole (PRILOSEC) 20 MG capsule Take 20 mg by mouth daily.        . QUEtiapine (SEROQUEL) 50 MG tablet Take 50 mg by mouth at bedtime.        . simvastatin (ZOCOR) 10 MG tablet Take 20 mg by mouth at bedtime.       Marland Kitchen tiotropium (SPIRIVA) 18 MCG inhalation capsule Place 18 mcg into inhaler and inhale daily.        Marland Kitchen docusate sodium (COLACE) 100 MG capsule Take 100 mg by mouth 2 (two) times daily.        Marland Kitchen olmesartan (BENICAR) 5 MG tablet Take 1 tablet (5 mg total) by mouth daily.  30 tablet  1  . traZODone (DESYREL) 50 MG tablet Take 50 mg by mouth at bedtime.         No current facility-administered medications on file as of 04/06/2011.    Social History:   reports that she quit smoking about 3 years ago. Her smoking use included Cigarettes. She has a 50 pack-year smoking  history. She has never used smokeless tobacco. She reports that she drinks alcohol. She reports that she uses illicit drugs. She has previously used crack cocaine which has resulted in a number of strokes as well as myocardial infarctions.  Family History: Family History  Problem Relation Age of Onset  . Breast cancer Mother   . Birth defects Mother     breast  . Cancer Mother     breast  . Heart disease Father     heart attack  . Heart attack Father   . Heart attack Brother   . Cancer Brother     throat, lung  . Cancer Paternal Aunt   . Cancer Maternal Grandmother     breast     Reproductive History G2P1, menarche 88, s/p total hysterectomy; unknown hx of HRT.  Review of Systems: Constitutional ROS: Fever none, Chills, Night Sweats, Anorexia, Pain decreased weight unknown weight loss Cardiovascular ROS: no chest pain or dyspnea on exertion negative Respiratory ROS: no cough, shortness of breath, or wheezing Neurological ROS: negative Dermatological ROS: negative ENT ROS: negative Gastrointestinal ROS: negative Genito-Urinary ROS: negative Hematological and Lymphatic ROS: negative Breast ROS: positive for - new or changing breast lumps Musculoskeletal ROS: negative Remaining ROS negative.  Physical Exam: Blood pressure 115/76, pulse 64, temperature 97.6 F (36.4 C), temperature source Oral, height 5\' 1"  (1.549 m), weight 102 lb (46.267 kg). General appearance: cachectic, distracted and no distress, audible wheezes. Head: Normocephalic, without obvious abnormality, atraumatic Neck: no adenopathy, no carotid bruit, no JVD, supple, symmetrical, trachea midline and thyroid not enlarged, symmetric, no tenderness/mass/nodules Lymph nodes: Cervical, supraclavicular, and axillary nodes normal. chest clear, no wheezing, rales, normal symmetric air entry, Heart exam - S1, S2 normal, no murmur, no gallop, rate regular regular rate and rhythm, S1, S2 normal, no murmur, click, rub  or gallop and prominent apical impulse abdomen is soft without significant tenderness, masses, organomegaly or guarding normal appearance, no masses or tenderness extremities normal, atraumatic, no cyanosis or edema Grossly normal Lt breast tender 2 cm breast mass, now no longer that appreciable.. Still tender. , n adenopathy. Lab Results: Lab Results  Component Value Date   WBC 5.3  03/01/2011   HGB 13.3 03/01/2011   HCT 40.1 03/01/2011   MCV 93.8 03/01/2011   PLT 194 03/01/2011     Chemistry      Component Value Date/Time   NA 144 03/01/2011 1601   K 3.6 03/01/2011 1601   CL 102 03/01/2011 1601   CO2 31 03/01/2011 1601   BUN 22 03/01/2011 1601   CREATININE 1.27* 03/01/2011 1601      Component Value Date/Time   CALCIUM 10.0 03/01/2011 1601   ALKPHOS 99 03/01/2011 1601   AST 22 03/01/2011 1601   ALT 17 03/01/2011 1601   BILITOT 0.4 03/01/2011 1601       Radiological Studies: Per above  Impression and Plan:  Ms [name] seems to be doing well, we discussed additional vitamin D supplementation , f/u u/s of her breast in 6 weeks and initiation of prolia for her osteperosis. I will see her after her u/s.    Pierce Crane, MD 04/06/2011, 2:58 PM

## 2011-04-06 NOTE — Patient Instructions (Signed)
  Pls take 2000 un- its of vitamin D3 as well as Calcium with vitaminD ( caltrate D)- 2/day  We will make arrangements to give you an injection to make your bones stronger.. This will be given every 6 months. We have to get insurance approval first.  I will get an ultrasound of your breast before you come back.

## 2011-04-08 ENCOUNTER — Encounter (HOSPITAL_COMMUNITY): Payer: Self-pay | Admitting: Emergency Medicine

## 2011-04-08 ENCOUNTER — Inpatient Hospital Stay (HOSPITAL_COMMUNITY)
Admission: EM | Admit: 2011-04-08 | Discharge: 2011-04-13 | DRG: 313 | Disposition: A | Discharge status: Home / Self Care | Payer: Medicare Other | Source: Ambulatory Visit | Attending: Cardiovascular Disease | Admitting: Cardiovascular Disease

## 2011-04-08 ENCOUNTER — Emergency Department (HOSPITAL_COMMUNITY): Payer: Medicare Other

## 2011-04-08 DIAGNOSIS — F17200 Nicotine dependence, unspecified, uncomplicated: Secondary | ICD-10-CM | POA: Diagnosis present

## 2011-04-08 DIAGNOSIS — I214 Non-ST elevation (NSTEMI) myocardial infarction: Secondary | ICD-10-CM

## 2011-04-08 DIAGNOSIS — F411 Generalized anxiety disorder: Secondary | ICD-10-CM | POA: Diagnosis present

## 2011-04-08 DIAGNOSIS — Z7982 Long term (current) use of aspirin: Secondary | ICD-10-CM

## 2011-04-08 DIAGNOSIS — Z8673 Personal history of transient ischemic attack (TIA), and cerebral infarction without residual deficits: Secondary | ICD-10-CM

## 2011-04-08 DIAGNOSIS — I5022 Chronic systolic (congestive) heart failure: Secondary | ICD-10-CM | POA: Diagnosis present

## 2011-04-08 DIAGNOSIS — F209 Schizophrenia, unspecified: Secondary | ICD-10-CM | POA: Diagnosis present

## 2011-04-08 DIAGNOSIS — Z853 Personal history of malignant neoplasm of breast: Secondary | ICD-10-CM

## 2011-04-08 DIAGNOSIS — I2510 Atherosclerotic heart disease of native coronary artery without angina pectoris: Secondary | ICD-10-CM | POA: Diagnosis present

## 2011-04-08 DIAGNOSIS — R079 Chest pain, unspecified: Secondary | ICD-10-CM | POA: Diagnosis present

## 2011-04-08 DIAGNOSIS — E7800 Pure hypercholesterolemia, unspecified: Secondary | ICD-10-CM | POA: Diagnosis present

## 2011-04-08 DIAGNOSIS — R64 Cachexia: Secondary | ICD-10-CM | POA: Diagnosis present

## 2011-04-08 DIAGNOSIS — I509 Heart failure, unspecified: Secondary | ICD-10-CM | POA: Diagnosis present

## 2011-04-08 DIAGNOSIS — J441 Chronic obstructive pulmonary disease with (acute) exacerbation: Secondary | ICD-10-CM | POA: Diagnosis present

## 2011-04-08 DIAGNOSIS — C001 Malignant neoplasm of external lower lip: Secondary | ICD-10-CM | POA: Diagnosis present

## 2011-04-08 DIAGNOSIS — I428 Other cardiomyopathies: Secondary | ICD-10-CM | POA: Diagnosis present

## 2011-04-08 DIAGNOSIS — I498 Other specified cardiac arrhythmias: Secondary | ICD-10-CM | POA: Diagnosis present

## 2011-04-08 DIAGNOSIS — N289 Disorder of kidney and ureter, unspecified: Secondary | ICD-10-CM | POA: Diagnosis present

## 2011-04-08 DIAGNOSIS — E785 Hyperlipidemia, unspecified: Secondary | ICD-10-CM | POA: Diagnosis present

## 2011-04-08 DIAGNOSIS — R0789 Other chest pain: Principal | ICD-10-CM | POA: Diagnosis present

## 2011-04-08 DIAGNOSIS — F0390 Unspecified dementia without behavioral disturbance: Secondary | ICD-10-CM | POA: Diagnosis present

## 2011-04-08 DIAGNOSIS — I252 Old myocardial infarction: Secondary | ICD-10-CM

## 2011-04-08 DIAGNOSIS — C50919 Malignant neoplasm of unspecified site of unspecified female breast: Secondary | ICD-10-CM

## 2011-04-08 DIAGNOSIS — J45909 Unspecified asthma, uncomplicated: Secondary | ICD-10-CM | POA: Diagnosis present

## 2011-04-08 DIAGNOSIS — E559 Vitamin D deficiency, unspecified: Secondary | ICD-10-CM

## 2011-04-08 DIAGNOSIS — I10 Essential (primary) hypertension: Secondary | ICD-10-CM | POA: Diagnosis present

## 2011-04-08 LAB — DIFFERENTIAL
Basophils Absolute: 0 10*3/uL (ref 0.0–0.1)
Eosinophils Relative: 2 % (ref 0–5)
Lymphocytes Relative: 25 % (ref 12–46)
Lymphs Abs: 1.1 10*3/uL (ref 0.7–4.0)
Monocytes Absolute: 0.6 10*3/uL (ref 0.1–1.0)
Monocytes Relative: 13 % — ABNORMAL HIGH (ref 3–12)

## 2011-04-08 LAB — POCT I-STAT TROPONIN I: Troponin i, poc: 0.15 ng/mL (ref 0.00–0.08)

## 2011-04-08 LAB — CBC
HCT: 42.7 % (ref 36.0–46.0)
MCV: 93.2 fL (ref 78.0–100.0)
RDW: 13.1 % (ref 11.5–15.5)
WBC: 4.3 10*3/uL (ref 4.0–10.5)

## 2011-04-08 LAB — CARDIAC PANEL(CRET KIN+CKTOT+MB+TROPI)
CK, MB: 4.1 ng/mL — ABNORMAL HIGH (ref 0.3–4.0)
Relative Index: 3 — ABNORMAL HIGH (ref 0.0–2.5)
Total CK: 136 U/L (ref 7–177)
Troponin I: <0.3 ng/mL (ref <0.30)

## 2011-04-08 LAB — BASIC METABOLIC PANEL
BUN: 26 mg/dL — ABNORMAL HIGH (ref 6–23)
CO2: 28 mEq/L (ref 19–32)
Calcium: 10.6 mg/dL — ABNORMAL HIGH (ref 8.4–10.5)
Creatinine, Ser: 1.1 mg/dL (ref 0.50–1.10)
Glucose, Bld: 95 mg/dL (ref 70–99)

## 2011-04-08 MED ORDER — TRAZODONE HCL 50 MG PO TABS
50.0000 mg | ORAL_TABLET | Freq: Every day | ORAL | Status: DC
  Administered 2011-04-08 – 2011-04-12 (×5): 50 mg via ORAL
  Filled 2011-04-08 (×6): qty 1

## 2011-04-08 MED ORDER — NITROGLYCERIN 0.4 MG SL SUBL: 0.4000 mg | SUBLINGUAL_TABLET | SUBLINGUAL | Status: DC | PRN

## 2011-04-08 MED ORDER — ONDANSETRON HCL 4 MG/2ML IJ SOLN: 4.0000 mg | Freq: Four times a day (QID) | INTRAMUSCULAR | Status: DC | PRN

## 2011-04-08 MED ORDER — ALBUTEROL SULFATE (5 MG/ML) 0.5% IN NEBU
5.0000 mg | INHALATION_SOLUTION | Freq: Once | RESPIRATORY_TRACT | Status: AC
  Administered 2011-04-08: 5 mg via RESPIRATORY_TRACT
  Filled 2011-04-08: qty 1

## 2011-04-08 MED ORDER — FUROSEMIDE 40 MG PO TABS
40.0000 mg | ORAL_TABLET | Freq: Every day | ORAL | Status: DC
  Administered 2011-04-09 – 2011-04-12 (×4): 40 mg via ORAL
  Filled 2011-04-08 (×5): qty 1

## 2011-04-08 MED ORDER — HEPARIN BOLUS VIA INFUSION
3000.0000 [IU] | Freq: Once | INTRAVENOUS | Status: DC
  Administered 2011-04-08: 3000 [IU] via INTRAVENOUS

## 2011-04-08 MED ORDER — ASPIRIN 81 MG PO TABS: 81.0000 mg | ORAL_TABLET | Freq: Every day | ORAL | Status: DC

## 2011-04-08 MED ORDER — ASPIRIN 81 MG PO CHEW
324.0000 mg | CHEWABLE_TABLET | Freq: Once | ORAL | Status: AC
  Administered 2011-04-08: 324 mg via ORAL
  Filled 2011-04-08: qty 4

## 2011-04-08 MED ORDER — LETROZOLE 2.5 MG PO TABS
2.5000 mg | ORAL_TABLET | Freq: Every day | ORAL | Status: DC
  Administered 2011-04-09 – 2011-04-12 (×4): 2.5 mg via ORAL
  Filled 2011-04-08 (×5): qty 1

## 2011-04-08 MED ORDER — ALBUTEROL SULFATE (5 MG/ML) 0.5% IN NEBU
2.5000 mg | INHALATION_SOLUTION | Freq: Four times a day (QID) | RESPIRATORY_TRACT | Status: DC | PRN
  Administered 2011-04-09: 2.5 mg via RESPIRATORY_TRACT
  Filled 2011-04-08 (×2): qty 0.5

## 2011-04-08 MED ORDER — ASPIRIN EC 81 MG PO TBEC
81.0000 mg | DELAYED_RELEASE_TABLET | Freq: Every day | ORAL | Status: DC
  Administered 2011-04-09 – 2011-04-12 (×4): 81 mg via ORAL
  Filled 2011-04-08 (×5): qty 1

## 2011-04-08 MED ORDER — SIMVASTATIN 20 MG PO TABS
20.0000 mg | ORAL_TABLET | Freq: Every day | ORAL | Status: DC
  Administered 2011-04-08 – 2011-04-12 (×5): 20 mg via ORAL
  Filled 2011-04-08 (×8): qty 1

## 2011-04-08 MED ORDER — ALPRAZOLAM 0.25 MG PO TABS
0.2500 mg | ORAL_TABLET | Freq: Two times a day (BID) | ORAL | Status: DC | PRN
  Administered 2011-04-09: 0.25 mg via ORAL
  Filled 2011-04-08: qty 1

## 2011-04-08 MED ORDER — SODIUM CHLORIDE 0.9 % IJ SOLN
3.0000 mL | Freq: Two times a day (BID) | INTRAMUSCULAR | Status: DC
  Administered 2011-04-08 – 2011-04-12 (×8): 3 mL via INTRAVENOUS

## 2011-04-08 MED ORDER — METOPROLOL TARTRATE 25 MG PO TABS
25.0000 mg | ORAL_TABLET | Freq: Two times a day (BID) | ORAL | Status: DC
  Administered 2011-04-08 – 2011-04-09 (×3): 25 mg via ORAL
  Filled 2011-04-08 (×5): qty 1

## 2011-04-08 MED ORDER — OLANZAPINE 2.5 MG PO TABS
2.5000 mg | ORAL_TABLET | Freq: Every day | ORAL | Status: DC
  Administered 2011-04-08 – 2011-04-12 (×5): 2.5 mg via ORAL
  Filled 2011-04-08 (×6): qty 1

## 2011-04-08 MED ORDER — SODIUM CHLORIDE 0.9 % IJ SOLN: 3.0000 mL | INTRAMUSCULAR | Status: DC | PRN

## 2011-04-08 MED ORDER — ALBUTEROL SULFATE (5 MG/ML) 0.5% IN NEBU
INHALATION_SOLUTION | RESPIRATORY_TRACT | Status: AC
  Filled 2011-04-08: qty 1

## 2011-04-08 MED ORDER — ASPIRIN EC 81 MG PO TBEC
81.0000 mg | DELAYED_RELEASE_TABLET | Freq: Every day | ORAL | Status: DC
  Filled 2011-04-08: qty 1

## 2011-04-08 MED ORDER — QUETIAPINE FUMARATE 50 MG PO TABS
50.0000 mg | ORAL_TABLET | Freq: Every day | ORAL | Status: DC
  Administered 2011-04-08 – 2011-04-12 (×5): 50 mg via ORAL
  Filled 2011-04-08 (×6): qty 1

## 2011-04-08 MED ORDER — OLMESARTAN MEDOXOMIL 5 MG PO TABS
5.0000 mg | ORAL_TABLET | Freq: Every day | ORAL | Status: DC
  Administered 2011-04-09: 5 mg via ORAL
  Filled 2011-04-08 (×2): qty 1

## 2011-04-08 MED ORDER — ACETAMINOPHEN 325 MG PO TABS: 650.0000 mg | ORAL_TABLET | ORAL | Status: DC | PRN

## 2011-04-08 MED ORDER — HEPARIN BOLUS VIA INFUSION
2500.0000 [IU] | Freq: Once | INTRAVENOUS | Status: AC
  Administered 2011-04-08: 2500 [IU] via INTRAVENOUS

## 2011-04-08 MED ORDER — CARVEDILOL 3.125 MG PO TABS
3.1250 mg | ORAL_TABLET | Freq: Two times a day (BID) | ORAL | Status: DC
  Administered 2011-04-09 (×2): 3.125 mg via ORAL
  Filled 2011-04-08 (×5): qty 1

## 2011-04-08 MED ORDER — SIMVASTATIN 20 MG PO TABS: 20.0000 mg | ORAL_TABLET | Freq: Every day | ORAL | Status: DC

## 2011-04-08 MED ORDER — ISOSORBIDE MONONITRATE ER 30 MG PO TB24
30.0000 mg | ORAL_TABLET | Freq: Every day | ORAL | Status: DC
  Administered 2011-04-09: 30 mg via ORAL
  Filled 2011-04-08 (×2): qty 1

## 2011-04-08 MED ORDER — ZOLPIDEM TARTRATE 5 MG PO TABS: 5.0000 mg | ORAL_TABLET | Freq: Every evening | ORAL | Status: DC | PRN

## 2011-04-08 MED ORDER — PREDNISONE 20 MG PO TABS
60.0000 mg | ORAL_TABLET | Freq: Once | ORAL | Status: AC
  Administered 2011-04-08: 60 mg via ORAL
  Filled 2011-04-08: qty 3

## 2011-04-08 MED ORDER — ASPIRIN 81 MG PO CHEW: 324.0000 mg | CHEWABLE_TABLET | Freq: Once | ORAL | Status: DC

## 2011-04-08 MED ORDER — CLOPIDOGREL BISULFATE 75 MG PO TABS
75.0000 mg | ORAL_TABLET | Freq: Every day | ORAL | Status: DC
  Administered 2011-04-09 – 2011-04-13 (×5): 75 mg via ORAL
  Filled 2011-04-08 (×6): qty 1

## 2011-04-08 MED ORDER — DOCUSATE SODIUM 100 MG PO CAPS
100.0000 mg | ORAL_CAPSULE | Freq: Two times a day (BID) | ORAL | Status: DC
  Administered 2011-04-08 – 2011-04-12 (×9): 100 mg via ORAL
  Filled 2011-04-08 (×11): qty 1

## 2011-04-08 MED ORDER — HEPARIN (PORCINE) IN NACL 100-0.45 UNIT/ML-% IJ SOLN
12.0000 [IU]/kg/h | INTRAMUSCULAR | Status: DC
  Administered 2011-04-08 (×2): 12 [IU]/kg/h via INTRAVENOUS
  Filled 2011-04-08 (×2): qty 250

## 2011-04-08 MED ORDER — TIOTROPIUM BROMIDE MONOHYDRATE 18 MCG IN CAPS
18.0000 ug | ORAL_CAPSULE | Freq: Every day | RESPIRATORY_TRACT | Status: DC
  Administered 2011-04-09 – 2011-04-12 (×4): 18 ug via RESPIRATORY_TRACT
  Filled 2011-04-08: qty 5

## 2011-04-08 MED ORDER — PANTOPRAZOLE SODIUM 40 MG PO TBEC
40.0000 mg | DELAYED_RELEASE_TABLET | Freq: Every day | ORAL | Status: DC
  Administered 2011-04-09 – 2011-04-13 (×5): 40 mg via ORAL
  Filled 2011-04-08 (×5): qty 1

## 2011-04-08 MED ORDER — SODIUM CHLORIDE 0.9 % IV SOLN: 250.0000 mL | INTRAVENOUS | Status: DC | PRN

## 2011-04-08 NOTE — Consult Note (Signed)
ANTICOAGULATION CONSULT NOTE - Initial Consult  Pharmacy Consult for Heparin Indication: chest pain/ACS  Allergies  Allergen Reactions  . Codeine Itching    All over the body  . Penicillins Hives and Other (See Comments)    "Whelps" per patient.  . Hydralazine     02/28/11 Family unsure if this is allergy for pt    Patient Measurements: Height: 5\' 1"  (154.9 cm) Weight: 90 lb (40.824 kg) IBW/kg (Calculated) : 47.8  Heparin Dosing Weight: 40.8kg  Vital Signs: Temp: 97.9 F (36.6 C) (02/15 1632) Temp src: Oral (02/15 1632) BP: 140/104 mmHg (02/15 1800) Pulse Rate: 57  (02/15 1800)  Labs:  Basename 04/08/11 1726  HGB 14.3  HCT 42.7  PLT 185  APTT --  LABPROT --  INR --  HEPARINUNFRC --  CREATININE 1.10  CKTOTAL --  CKMB --  TROPONINI --   Estimated Creatinine Clearance: 32.4 ml/min (by C-G formula based on Cr of 1.1).  Medical History: Past Medical History  Diagnosis Date  . Weight loss, unintentional   . Trouble swallowing   . Change in voice   . Substance abuse   . Dementia   . Asthma   . Shortness of breath   . Seizures     "    It has been along time "  . CHF (congestive heart failure)   . Headache   . Hypertension   . Arthralgia   . Tremors of nervous system   . Anxiety   . Rib fractures     hx of May 2012  . Heart attack 05/21/10  . Schizophrenia   . Coronary artery disease   . Stroke 07/11/10    hx of R CVA   . COPD (chronic obstructive pulmonary disease)   . Obstructive sleep apnea   . Breast cancer 01/28/11    L breast, inv ductal/in situ, ER/PR -, Her2 -   Assessment: 66yof to begin heparin for elevated troponin. Weight 40.8kg and CrCl 32.13ml/min. No anticoagulants PTA.  Goal of Therapy:  Heparin level 0.3-0.7 units/ml   Plan:  1) Heparin bolus 2500 units x 1 2) Heparin drip at 500 units/hr 3) 6 hour heparin level 4) Daily heparin level and CBC  Fredrik Rigger 04/08/2011,7:09 PM

## 2011-04-08 NOTE — ED Notes (Signed)
Pharmacy called for heparin gtt

## 2011-04-08 NOTE — ED Notes (Signed)
Cardiology here wants to hold heparin until pharmacy doses.

## 2011-04-08 NOTE — ED Notes (Signed)
Pt was wheezing upon ems arrival.  ems gave albuterol neb and pt states is feeling "pretty good" now.  Scattered wheezes noted to auscultation. Denies sob.

## 2011-04-08 NOTE — ED Provider Notes (Signed)
History     CSN: [id number]  Arrival date & time 04/08/11  1618   First MD Initiated Contact with Patient 04/08/11 1629      Chief Complaint  Patient presents with  . Shortness of Breath    pt was wheezing for ems, given neb enroute and states is feeling better. scattered wheezes noted but denies sob.    (Consider location/radiation/quality/duration/timing/severity/associated sxs/prior treatment) Patient is a 66 y.o. female presenting with shortness of breath. The history is provided by the patient, the EMS personnel and medical records.  Shortness of Breath  Associated symptoms include cough and shortness of breath. Pertinent negatives include no fever.   the patient is a 66 year old female, with a history of COPD, congestive heart failure, and coronary artery disease, who presents to the emergency department complaining of shortness of breath and nonproductive cough, and intermittent, chest tightness since yesterday.  EMS brought her to the emergency department for evaluation.  They said that she had been wheezing, and they treated her.  Wheezing is resolved now.  She says that she feels better but still shortness of breath.  She denies chest pain.  At this time.  She has not had a fevers, chills, sweats, pain or swelling.  She denies recent illness.  She does not smoke cigarettes.  Past Medical History  Diagnosis Date  . Weight loss, unintentional   . Trouble swallowing   . Change in voice   . Substance abuse   . Dementia   . Asthma   . Shortness of breath   . Seizures     "    It has been along time "  . CHF (congestive heart failure)   . Headache   . Hypertension   . Arthralgia   . Tremors of nervous system   . Anxiety   . Rib fractures     hx of May 2012  . Heart attack 05/21/10  . Schizophrenia   . Coronary artery disease   . Stroke 07/11/10    hx of R CVA   . COPD (chronic obstructive pulmonary disease)   . Obstructive sleep apnea   . Breast cancer 01/28/11    L  breast, inv ductal/in situ, ER/PR -, Her2 -    Past Surgical History  Procedure Date  . Total abdominal hysterectomy age 84    Family History  Problem Relation Age of Onset  . Breast cancer Mother   . Birth defects Mother     breast  . Cancer Mother     breast  . Heart disease Father     heart attack  . Heart attack Father   . Heart attack Brother   . Cancer Brother     throat, lung  . Cancer Paternal Aunt   . Cancer Maternal Grandmother     breast     History  Substance Use Topics  . Smoking status: Former Smoker -- 1.0 packs/day for 50 years    Types: Cigarettes    Quit date: 02/22/2008  . Smokeless tobacco: Never Used   Comment: started smoking at age 36  . Alcohol Use: Yes     social use    OB History    Grav Para Term Preterm Abortions TAB SAB Ect Mult Living                  Review of Systems  Constitutional: Negative for fever and chills.  Respiratory: Positive for cough, chest tightness and shortness of breath.  Cardiovascular: Negative for palpitations and leg swelling.  Gastrointestinal: Negative for nausea and vomiting.  Neurological: Negative for headaches.  Psychiatric/Behavioral: Positive for confusion.       The patient has a reported history of dementia.  She answers yes and no questions, but states that she does not know who her cardiologist is an intracerebral, questions saying.  I do not know  All other systems reviewed and are negative.    Allergies  Codeine; Penicillins; and Hydralazine  Home Medications   Current Outpatient Rx  Name Route Sig Dispense Refill  . ALBUTEROL SULFATE (2.5 MG/3ML) 0.083% IN NEBU Nebulization Take 2.5 mg by nebulization every 6 (six) hours as needed. For wheezing    . ALBUTEROL 90 MCG/ACT IN AERS Inhalation Inhale 2 puffs into the lungs every 6 (six) hours as needed. For wheezing     . ASPIRIN 81 MG PO TABS Oral Take 81 mg by mouth daily.      . BUDESONIDE-FORMOTEROL FUMARATE 80-4.5 MCG/ACT IN AERO  Inhalation Inhale 2 puffs into the lungs 2 (two) times daily. 1 Inhaler 1  . CARVEDILOL 3.125 MG PO TABS Oral Take 3.125 mg by mouth 2 (two) times daily with a meal.      . CLOPIDOGREL BISULFATE 75 MG PO TABS Oral Take 75 mg by mouth daily.      Marland Kitchen DOCUSATE SODIUM 100 MG PO CAPS Oral Take 100 mg by mouth 2 (two) times daily.      . FUROSEMIDE 40 MG PO TABS Oral Take 40 mg by mouth daily.      . ISOSORBIDE MONONITRATE ER 30 MG PO TB24 Oral Take 30 mg by mouth daily.      Marland Kitchen LETROZOLE 2.5 MG PO TABS Oral Take 2.5 mg by mouth daily.    Marland Kitchen METOPROLOL TARTRATE 50 MG PO TABS Oral Take 50 mg by mouth daily.    Marland Kitchen NITROGLYCERIN 0.4 MG SL SUBL Sublingual Place 0.4 mg under the tongue every 5 (five) minutes as needed.      Marland Kitchen OLANZAPINE 2.5 MG PO TABS Oral Take 2.5 mg by mouth at bedtime.      Marland Kitchen OLMESARTAN MEDOXOMIL 5 MG PO TABS Oral Take 1 tablet (5 mg total) by mouth daily. 30 tablet 1  . OMEPRAZOLE 20 MG PO CPDR Oral Take 20 mg by mouth daily.      . QUETIAPINE FUMARATE 50 MG PO TABS Oral Take 50 mg by mouth at bedtime.      Marland Kitchen SIMVASTATIN 10 MG PO TABS Oral Take 20 mg by mouth at bedtime.     Marland Kitchen TIOTROPIUM BROMIDE MONOHYDRATE 18 MCG IN CAPS Inhalation Place 18 mcg into inhaler and inhale daily.      . TRAZODONE HCL 50 MG PO TABS Oral Take 50 mg by mouth at bedtime.      Marland Kitchen ZOLPIDEM TARTRATE 5 MG PO TABS Oral Take 5 mg by mouth at bedtime as needed.      BP 145/88  Pulse 72  Temp(Src) 97.9 F (36.6 C) (Oral)  Resp 18  Ht 5\' 1"  (1.549 m)  Wt 90 lb (40.824 kg)  BMI 17.01 kg/m2  SpO2 100%  Physical Exam  Vitals reviewed. Constitutional: No distress.       Cachectic elderly female, in no distress  HENT:  Head: Normocephalic and atraumatic.  Eyes: Conjunctivae are normal. Pupils are equal, round, and reactive to light.  Neck: Normal range of motion. Neck supple.  Cardiovascular: Normal rate.   No murmur heard.  Pulmonary/Chest: Effort normal. No respiratory distress. She has no wheezes. She has no  rales.  Abdominal: Soft. She exhibits no distension. There is no tenderness.  Musculoskeletal: Normal range of motion. She exhibits no edema and no tenderness.  Neurological: She is alert. No cranial nerve deficit.  Skin: Skin is warm and dry.    ED Course  Procedures (including critical care time) 66 year old female, with a history of COPD, congestive heart failure, and coronary artery disease, presents with intermittent chest tightness and shortness of breath since yesterday.  She had wheezing.  According to EMS, which was treated with nebulizer treatments and is resolved now.  She is in no distress.  She denies chest pain.  At this time.  We'll perform a chest x-ray, and laboratory testing, and EKG, to see if she has any evidence of pneumonia, congestive heart failure or cardiac ischemia.   Labs Reviewed  CBC  DIFFERENTIAL  BASIC METABOLIC PANEL   Dg Chest Port 1 View  04/08/2011  *RADIOLOGY REPORT*  Clinical Data: Shortness of breath, former smoking history  PORTABLE CHEST - 1 VIEW  Comparison: Chest x-ray of 02/17/2011 and CT chest of the same date  Findings: The lungs are hyperaerated consistent with COPD.  No active infiltrate or effusion is seen.  Mild cardiomegaly is stable.  No bony abnormality is noted.  IMPRESSION: COPD.  Stable mild cardiomegaly.  Original Report Authenticated By: Juline Patch, M.D.     No diagnosis found.  Chart review reveals that she had a catheterization in November of 2010 performed by Dr. Edwyna Shell 18.  Findings were left ventricular with global hypokinesis an ejection fracture of 20-25%.  The left main was patent.  The LAD had a 20-25%, mild stenosis.  The diagonal one and diagonal 4 were very small and patent.  Ramus was large and patent.  Left circumflex was small with a 20-30% mid stenosis.  OM1 was small and patent.  RCA was 10-20 proximal and 30-40% mid stenosis with TIMI grade 3 distal flow.  PDA and PLB branches were small and patent  6:27  PM Explained findings and need for admission.  She understands.   Pt repeats that she does NOT have cp now.    7:47 PM Spoke with dr. Algie Coffer. He will come admit.  MDM  nonstemi in 58 y female with known cad.  Manifested as intermittent cp and sob.  Pain free now.          Nicholes Stairs, MD 04/08/11 [phone]

## 2011-04-08 NOTE — ED Notes (Signed)
Called and gave report to Agatha. 

## 2011-04-08 NOTE — ED Notes (Signed)
2013-01 Ready 

## 2011-04-08 NOTE — ED Notes (Signed)
Patient's family phone numbers:  [name] (sister) [phone] and Jamey Reas [phone].  Hale Bogus was advised of patient's status.

## 2011-04-08 NOTE — H&P (Signed)
[name] is an 66 y.o. female.   Chief Complaint: Shortness of breath and chest tightness. HPI: 66 years  Old black female with stage 2, primary breast cancer, inv ductal, in situ, ER/PR-, Her2 -, had shortness of breath and chest tightness with abnormal T.I. She also has history of asthma with improvement in wheezing post albuterol nebulizer treatment. Cirrently feeling better.  Past Medical History  Diagnosis Date  . Weight loss, unintentional   . Trouble swallowing   . Change in voice   . Substance abuse   . Dementia   . Asthma   . Shortness of breath   . Seizures     "    It has been along time "  . CHF (congestive heart failure)   . Headache   . Hypertension   . Arthralgia   . Tremors of nervous system   . Anxiety   . Rib fractures     hx of May 2012  . Heart attack 05/21/10  . Schizophrenia   . Coronary artery disease   . Stroke 07/11/10    hx of R CVA   . COPD (chronic obstructive pulmonary disease)   . Obstructive sleep apnea   . Breast cancer 01/28/11    L breast, inv ductal/in situ, ER/PR -, Her2 -      Past Surgical History  Procedure Date  . Total abdominal hysterectomy age 89    Family History  Problem Relation Age of Onset  . Breast cancer Mother   . Birth defects Mother     breast  . Cancer Mother     breast  . Heart disease Father     heart attack  . Heart attack Father   . Heart attack Brother   . Cancer Brother     throat, lung  . Cancer Paternal Aunt   . Cancer Maternal Grandmother     breast    Social History:  reports that she quit smoking about 3 years ago. Her smoking use included Cigarettes. She has a 50 pack-year smoking history. She has never used smokeless tobacco. She reports that she drinks alcohol. She reports that she uses illicit drugs.  Allergies:  Allergies  Allergen Reactions  . Codeine Itching    All over the body  . Penicillins Hives and Other (See Comments)    "Whelps" per patient.  . Hydralazine     02/28/11  Family unsure if this is allergy for pt    Medications Prior to Admission  Medication Dose Route Frequency Provider Last Rate Last Dose  . albuterol (PROVENTIL) (5 MG/ML) 0.5% nebulizer solution 5 mg  5 mg Nebulization Once Nicholes Stairs, MD   5 mg at 04/08/11 1751  . albuterol (PROVENTIL) (5 MG/ML) 0.5% nebulizer solution           . aspirin chewable tablet 324 mg  324 mg Oral Once Nicholes Stairs, MD   324 mg at 04/08/11 1738  . aspirin chewable tablet 324 mg  324 mg Oral Once Nicholes Stairs, MD      . heparin ADULT infusion 100 units/mL (25000 units/250 mL)  12 Units/kg/hr Intravenous Continuous Nicholes Stairs, MD 5 mL/hr at 04/08/11 1934 12 Units/kg/hr at 04/08/11 1934  . heparin bolus via infusion 2,500 Units  2,500 Units Intravenous Once Nicholes Stairs, MD      . predniSONE (DELTASONE) tablet 60 mg  60 mg Oral Once Nicholes Stairs, MD   60 mg at 04/08/11  1738  . DISCONTD: heparin bolus via infusion 3,000 Units  3,000 Units Intravenous Once Nicholes Stairs, MD       Medications Prior to Admission  Medication Sig Dispense Refill  . albuterol (PROVENTIL) (2.5 MG/3ML) 0.083% nebulizer solution Take 2.5 mg by nebulization every 6 (six) hours as needed. For wheezing      . albuterol (PROVENTIL) 90 MCG/ACT inhaler Inhale 2 puffs into the lungs every 6 (six) hours as needed. For wheezing       . aspirin 81 MG tablet Take 81 mg by mouth daily.        . budesonide-formoterol (SYMBICORT) 80-4.5 MCG/ACT inhaler Inhale 2 puffs into the lungs 2 (two) times daily.  1 Inhaler  1  . carvedilol (COREG) 3.125 MG tablet Take 3.125 mg by mouth 2 (two) times daily with a meal.        . clopidogrel (PLAVIX) 75 MG tablet Take 75 mg by mouth daily.        Marland Kitchen docusate sodium (COLACE) 100 MG capsule Take 100 mg by mouth 2 (two) times daily.        . furosemide (LASIX) 40 MG tablet Take 40 mg by mouth daily.        . isosorbide mononitrate (IMDUR) 30 MG 24 hr tablet Take 30 mg by  mouth daily.        Marland Kitchen letrozole (FEMARA) 2.5 MG tablet Take 2.5 mg by mouth daily.      . metoprolol (LOPRESSOR) 50 MG tablet Take 50 mg by mouth daily.      . nitroGLYCERIN (NITROSTAT) 0.4 MG SL tablet Place 0.4 mg under the tongue every 5 (five) minutes as needed.        Marland Kitchen OLANZapine (ZYPREXA) 2.5 MG tablet Take 2.5 mg by mouth at bedtime.        Marland Kitchen olmesartan (BENICAR) 5 MG tablet Take 1 tablet (5 mg total) by mouth daily.  30 tablet  1  . omeprazole (PRILOSEC) 20 MG capsule Take 20 mg by mouth daily.        . QUEtiapine (SEROQUEL) 50 MG tablet Take 50 mg by mouth at bedtime.        . simvastatin (ZOCOR) 10 MG tablet Take 20 mg by mouth at bedtime.       Marland Kitchen tiotropium (SPIRIVA) 18 MCG inhalation capsule Place 18 mcg into inhaler and inhale daily.        . traZODone (DESYREL) 50 MG tablet Take 50 mg by mouth at bedtime.        Marland Kitchen zolpidem (AMBIEN) 5 MG tablet Take 5 mg by mouth at bedtime as needed.        Results for orders placed during the hospital encounter of 04/08/11 (from the past 48 hour(s))  CBC     Status: Normal   Collection Time   04/08/11  5:26 PM      Component Value Range Comment   WBC 4.3  4.0 - 10.5 (K/uL)    RBC 4.58  3.87 - 5.11 (MIL/uL)    Hemoglobin 14.3  12.0 - 15.0 (g/dL)    HCT 16.1  09.6 - 04.5 (%)    MCV 93.2  78.0 - 100.0 (fL)    MCH 31.2  26.0 - 34.0 (pg)    MCHC 33.5  30.0 - 36.0 (g/dL)    RDW 40.9  81.1 - 91.4 (%)    Platelets 185  150 - 400 (K/uL)   DIFFERENTIAL     Status: Abnormal  Collection Time   04/08/11  5:26 PM      Component Value Range Comment   Neutrophils Relative 60  43 - 77 (%)    Neutro Abs 2.6  1.7 - 7.7 (K/uL)    Lymphocytes Relative 25  12 - 46 (%)    Lymphs Abs 1.1  0.7 - 4.0 (K/uL)    Monocytes Relative 13 (*) 3 - 12 (%)    Monocytes Absolute 0.6  0.1 - 1.0 (K/uL)    Eosinophils Relative 2  0 - 5 (%)    Eosinophils Absolute 0.1  0.0 - 0.7 (K/uL)    Basophils Relative 1  0 - 1 (%)    Basophils Absolute 0.0  0.0 - 0.1 (K/uL)     BASIC METABOLIC PANEL     Status: Abnormal   Collection Time   04/08/11  5:26 PM      Component Value Range Comment   Sodium 140  135 - 145 (mEq/L)    Potassium 4.7  3.5 - 5.1 (mEq/L)    Chloride 101  96 - 112 (mEq/L)    CO2 28  19 - 32 (mEq/L)    Glucose, Bld 95  70 - 99 (mg/dL)    BUN 26 (*) 6 - 23 (mg/dL)    Creatinine, Ser 0.98  0.50 - 1.10 (mg/dL)    Calcium 11.9 (*) 8.4 - 10.5 (mg/dL)    GFR calc non Af Amer 51 (*) >90 (mL/min)    GFR calc Af Amer 59 (*) >90 (mL/min)   POCT I-STAT TROPONIN I     Status: Abnormal   Collection Time   04/08/11  5:53 PM      Component Value Range Comment   Troponin i, poc 0.14 (*) 0.00 - 0.08 (ng/mL)    Comment NOTIFIED PHYSICIAN      Comment 3            CARDIAC PANEL(CRET KIN+CKTOT+MB+TROPI)     Status: Abnormal   Collection Time   04/08/11  7:47 PM      Component Value Range Comment   Total CK 136  7 - 177 (U/L)    CK, MB 4.1 (*) 0.3 - 4.0 (ng/mL)    Troponin I <0.30  <0.30 (ng/mL)    Relative Index 3.0 (*) 0.0 - 2.5    POCT I-STAT TROPONIN I     Status: Abnormal   Collection Time   04/08/11  8:12 PM      Component Value Range Comment   Troponin i, poc 0.15 (*) 0.00 - 0.08 (ng/mL)    Comment 3             Dg Chest Port 1 View  04/08/2011  *RADIOLOGY REPORT*  Clinical Data: Shortness of breath, former smoking history  PORTABLE CHEST - 1 VIEW  Comparison: Chest x-ray of 02/17/2011 and CT chest of the same date  Findings: The lungs are hyperaerated consistent with COPD.  No active infiltrate or effusion is seen.  Mild cardiomegaly is stable.  No bony abnormality is noted.  IMPRESSION: COPD.  Stable mild cardiomegaly.  Original Report Authenticated By: Juline Patch, M.D.    @ROS @ Constitutional ROS: No Fever, Chills, Night Sweats, Anorexia, Pain, decreased weight or weight gain Cardiovascular ROS: + chest pain, + dyspnea on exertion   Respiratory ROS: + cough, + shortness of breath, + wheezing + Asthma, + COPD Neurological ROS: + CVA   Dermatological ROS: negative  ENT ROS: negative  Gastrointestinal ROS: negative  Genito-Urinary  ROS: negative  Hematological and Lymphatic ROS: negative  Breast ROS: positive for inv ductal cancer of left breast, - new or changing breast lumps  Musculoskeletal ROS: negative   Blood pressure 155/84, pulse 87, temperature 97.6 F (36.4 C), temperature source Oral, resp. rate 22, height 5\' 1"  (1.549 m), weight 40.824 kg (90 lb), SpO2 100.00%. General appearance: cachectic, distracted and no distress, audible end expiratory wheezes.  Head: Normocephalic, without obvious abnormality, atraumatic  Neck: no adenopathy, no carotid bruit, no JVD, supple, symmetrical, trachea midline and thyroid not enlarged.  Lymph nodes: Cervical, supraclavicular, and axillary nodes normal.  Chest clear, + wheezing, no rales, normal symmetric air entry,  Heart exam - S1, S2 normal, no murmur, no gallop, rate regular  Abdomen is soft without significant tenderness, masses, or organomegaly. Extremities normal, atraumatic, no cyanosis or edema  CNS: Moves all ext. A x O x 1.   Assessment/Plan Chest pain Asthma COPD HTN Dementia CAD Cardiomyopathy Left breast cancer  Plan r/o MI, Monitor, Breathing treatments, home medications.  Isael Stille S 04/08/2011, 9:13 PM

## 2011-04-08 NOTE — ED Notes (Signed)
Called to give report to floor RN.  She is in a contact isolation room.  Secretary asked me to call back "in a few minutes."

## 2011-04-09 LAB — DIFFERENTIAL
Eosinophils Absolute: 0 10*3/uL (ref 0.0–0.7)
Eosinophils Relative: 0 % (ref 0–5)
Lymphs Abs: 0.3 10*3/uL — ABNORMAL LOW (ref 0.7–4.0)
Monocytes Relative: 1 % — ABNORMAL LOW (ref 3–12)
Neutrophils Relative %: 92 % — ABNORMAL HIGH (ref 43–77)

## 2011-04-09 LAB — CARDIAC PANEL(CRET KIN+CKTOT+MB+TROPI)
CK, MB: 4.1 ng/mL — ABNORMAL HIGH (ref 0.3–4.0)
CK, MB: 4.2 ng/mL — ABNORMAL HIGH (ref 0.3–4.0)
CK, MB: 4.1 ng/mL — ABNORMAL HIGH (ref 0.3–4.0)
Relative Index: 3.1 — ABNORMAL HIGH (ref 0.0–2.5)
Total CK: 123 U/L (ref 7–177)
Troponin I: <0.3 ng/mL (ref <0.30)
Troponin I: <0.3 ng/mL (ref <0.30)

## 2011-04-09 LAB — CBC
Hemoglobin: 13.2 g/dL (ref 12.0–15.0)
MCH: 30.2 pg (ref 26.0–34.0)
MCH: 30.6 pg (ref 26.0–34.0)
MCHC: 31.8 g/dL (ref 30.0–36.0)
MCV: 92.8 fL (ref 78.0–100.0)
MCV: 95 fL (ref 78.0–100.0)
Platelets: 174 10*3/uL (ref 150–400)
RBC: 4.31 MIL/uL (ref 3.87–5.11)
RDW: 13.1 % (ref 11.5–15.5)
WBC: 5 10*3/uL (ref 4.0–10.5)

## 2011-04-09 LAB — PROTIME-INR
INR: 1.07 (ref 0.00–1.49)
Prothrombin Time: 14.1 seconds (ref 11.6–15.2)

## 2011-04-09 LAB — LIPID PANEL
HDL: 99 mg/dL (ref >39)
LDL Cholesterol: 114 mg/dL — ABNORMAL HIGH (ref 0–99)
Total CHOL/HDL Ratio: 2.2 RATIO
Triglycerides: 32 mg/dL (ref <150)
VLDL: 6 mg/dL (ref 0–40)

## 2011-04-09 LAB — BASIC METABOLIC PANEL
CO2: 26 mEq/L (ref 19–32)
Calcium: 10.2 mg/dL (ref 8.4–10.5)
Glucose, Bld: 152 mg/dL — ABNORMAL HIGH (ref 70–99)
Sodium: 140 mEq/L (ref 135–145)

## 2011-04-09 LAB — APTT: aPTT: 86 seconds — ABNORMAL HIGH (ref 24–37)

## 2011-04-09 MED ORDER — HEPARIN (PORCINE) IN NACL 100-0.45 UNIT/ML-% IJ SOLN
600.0000 [IU]/h | INTRAMUSCULAR | Status: DC
  Administered 2011-04-09: 600 [IU]/h via INTRAVENOUS
  Filled 2011-04-09: qty 250

## 2011-04-09 NOTE — Progress Notes (Signed)
ANTICOAGULATION CONSULT NOTE - Follow Up Consult  Pharmacy Consult for heparin Indication: chest pain/ACS  Labs:  Basename 04/09/11 0945 04/09/11 0940 04/09/11 0700 04/09/11 0645 04/08/11 2342 04/08/11 2339 04/08/11 1726  HGB -- 12.2 -- -- 13.2 -- --  HCT -- 38.4 -- -- 40.0 -- 42.7  PLT -- 174 -- -- 205 -- 185  APTT -- -- -- -- 86* -- --  LABPROT -- 14.6 -- -- 14.1 -- --  INR -- 1.12 -- -- 1.07 -- --  HEPARINUNFRC -- 0.63 -- -- 0.30 -- --  CREATININE -- -- 1.08 -- -- -- 1.10  CKTOTAL 117 -- -- 123 -- 132 --  CKMB 4.1* -- -- 4.2* -- 4.1* --  TROPONINI <0.30 -- -- <0.30 -- <0.30 --   Assessment: 66yo female therapeutic on heparin for CP.  Heparin level = 0.63 this AM.   Goal of Therapy:  Heparin level 0.3-0.7 units/ml   Plan:  Decrease heparin drip to 550 units/hr F/u with AM heparin level  Clide Cliff PharmD BCPS 04/09/2011,1:25 PM

## 2011-04-09 NOTE — Progress Notes (Signed)
ANTICOAGULATION CONSULT NOTE - Follow Up Consult  Pharmacy Consult for heparin Indication: chest pain/ACS  Labs:  Basename 04/08/11 2342 04/08/11 2339 04/08/11 1947 04/08/11 1726  HGB 13.2 -- -- 14.3  HCT 40.0 -- -- 42.7  PLT 205 -- -- 185  APTT 86* -- -- --  LABPROT 14.1 -- -- --  INR 1.07 -- -- --  HEPARINUNFRC 0.30 -- -- --  CREATININE -- -- -- 1.10  CKTOTAL -- 132 136 --  CKMB -- 4.1* 4.1* --  TROPONINI -- <0.30 <0.30 --   Assessment: 66yo female therapeutic on heparin with initial dosing for CP though at very low end of goal range and level drawn ~2h early so would expect bolus to have some effect on level.  Goal of Therapy:  Heparin level 0.3-0.7 units/ml   Plan:  Will increase gtt by 2 units/kg/hr to 600 units/hr and check level in 8hr.  Colleen Can PharmD BCPS 04/09/2011,1:49 AM

## 2011-04-10 LAB — CBC
MCH: 30.5 pg (ref 26.0–34.0)
MCHC: 32.2 g/dL (ref 30.0–36.0)
MCV: 94.7 fL (ref 78.0–100.0)
Platelets: 170 10*3/uL (ref 150–400)
RDW: 13.3 % (ref 11.5–15.5)

## 2011-04-10 MED ORDER — HEPARIN SOD (PORCINE) IN D5W 100 UNIT/ML IV SOLN
450.0000 [IU]/h | INTRAVENOUS | Status: DC
  Filled 2011-04-10: qty 250

## 2011-04-10 MED ORDER — METOPROLOL TARTRATE 12.5 MG HALF TABLET
12.5000 mg | ORAL_TABLET | Freq: Two times a day (BID) | ORAL | Status: DC
  Filled 2011-04-10 (×2): qty 1

## 2011-04-10 NOTE — Progress Notes (Signed)
ANTICOAGULATION CONSULT NOTE - Follow Up Consult  Pharmacy Consult for heparin Indication: NSTEMI  Labs:  Basename 04/10/11 0535 04/09/11 0945 04/09/11 0940 04/09/11 0700 04/09/11 0645 04/08/11 2342 04/08/11 2339 04/08/11 1726  HGB 11.5* -- 12.2 -- -- -- -- --  HCT 35.7* -- 38.4 -- -- 40.0 -- --  PLT 170 -- 174 -- -- 205 -- --  APTT -- -- -- -- -- 86* -- --  LABPROT -- -- 14.6 -- -- 14.1 -- --  INR -- -- 1.12 -- -- 1.07 -- --  HEPARINUNFRC 1.12* -- 0.63 -- -- 0.30 -- --  CREATININE -- -- -- 1.08 -- -- -- 1.10  CKTOTAL -- 117 -- -- 123 -- 132 --  CKMB -- 4.1* -- -- 4.2* -- 4.1* --  TROPONINI -- <0.30 -- -- <0.30 -- <0.30 --   Assessment: 66yo female now supratherapeutic on heparin, accumulating, running at 600 units/hr.  Goal of Therapy:  Heparin level 0.3-0.7 units/ml   Plan:  Will hold gtt x43min then decrease gtt to 450 units/hr and check level 8hr after gtt resumed.  Colleen Can PharmD BCPS 04/10/2011,7:07 AM

## 2011-04-10 NOTE — Progress Notes (Signed)
Subjective:  Breathing better with albuterol treatment. Low blood pressure and heart rate.  Objective:  Vital Signs in the last 24 hours: Temp:  [97.5 F (36.4 C)-98.7 F (37.1 C)] 98.7 F (37.1 C) (02/17 0430) Pulse Rate:  [45-69] 45  (02/17 0718) Cardiac Rhythm:  [-] Bundle branch block;Sinus bradycardia (02/17 0802) Resp:  [18] 18  (02/17 0430) BP: (87-116)/(53-68) 96/62 mmHg (02/17 0600) SpO2:  [95 %-100 %] 98 % (02/17 0719)  Physical Exam: BP Readings from Last 1 Encounters:  04/10/11 96/62    Wt Readings from Last 1 Encounters:  04/08/11 45.677 kg (100 lb 11.2 oz)    Weight change:   HEENT: [place]/AT, Eyes-Brown, PERL, EOMI, Conjunctiva-Pink, Sclera-Non-icteric Neck: No JVD, No bruit, Trachea midline. Lungs:  Clear, Bilateral. Cardiac:  Regular rhythm, normal S1 and S2, no S3.  Abdomen:  Soft, non-tender. Extremities:  No edema present. No cyanosis. No clubbing. CNS: AxOx3, Cranial nerves grossly intact, moves all 4 extremities. Right handed. Skin: Warm and dry.   Intake/Output from previous day: 02/16 0701 - 02/17 0700 In: 360 [P.O.:360] Out: 350 [Urine:350]    Lab Results: BMET    Component Value Date/Time   NA 140 04/09/2011 0700   K 4.3 04/09/2011 0700   CL 105 04/09/2011 0700   CO2 26 04/09/2011 0700   GLUCOSE 152* 04/09/2011 0700   BUN 27* 04/09/2011 0700   CREATININE 1.08 04/09/2011 0700   CALCIUM 10.2 04/09/2011 0700   GFRNONAA 52* 04/09/2011 0700   GFRAA 61* 04/09/2011 0700   CBC    Component Value Date/Time   WBC 5.5 04/10/2011 0535   WBC 5.3 03/01/2011 1601   RBC 3.77* 04/10/2011 0535   RBC 4.28 03/01/2011 1601   HGB 11.5* 04/10/2011 0535   HGB 13.3 03/01/2011 1601   HCT 35.7* 04/10/2011 0535   HCT 40.1 03/01/2011 1601   PLT 170 04/10/2011 0535   PLT 194 03/01/2011 1601   MCV 94.7 04/10/2011 0535   MCV 93.8 03/01/2011 1601   MCH 30.5 04/10/2011 0535   MCH 31.0 03/01/2011 1601   MCHC 32.2 04/10/2011 0535   MCHC 33.0 03/01/2011 1601   RDW 13.3 04/10/2011 0535   RDW  14.5 03/01/2011 1601   LYMPHSABS 0.3* 04/08/2011 2342   LYMPHSABS 0.9 03/01/2011 1601   MONOABS 0.0* 04/08/2011 2342   MONOABS 0.3 03/01/2011 1601   EOSABS 0.0 04/08/2011 2342   EOSABS 0.1 03/01/2011 1601   BASOSABS 0.0 04/08/2011 2342   BASOSABS 0.0 03/01/2011 1601   CARDIAC ENZYMES Lab Results  Component Value Date   CKTOTAL 117 04/09/2011   CKMB 4.1* 04/09/2011   TROPONINI <0.30 04/09/2011    Assessment/Plan:  Patient Active Hospital Problem List: Chest pain at rest (04/08/2011) ASTHMA (04/06/2009) HYPERTENSION, BENIGN ESSENTIAL (04/06/2009) HYPERLIPIDEMIA (04/06/2009) SCHIZOPHRENIA (04/06/2009) ANXIETY (04/06/2009) CAD (03/31/2009) BRADYCARDIA  Hold B-blocker.  Stop Heparin   LOS: 2 days    Orpah Cobb  MD  04/10/2011, 8:52 AM

## 2011-04-11 NOTE — Progress Notes (Signed)
Subjective:  Complaints of left-sided localized chest pain increased with movement Denies any anginal chest pain states breathing has improved  Objective:  Vital Signs in the last 24 hours: Temp:  [97.4 F (36.3 C)-97.8 F (36.6 C)] 97.8 F (36.6 C) (02/18 0416) Pulse Rate:  [54-60] 54  (02/18 0416) Resp:  [20] 20  (02/18 0416) BP: (107-121)/(74-76) 107/74 mmHg (02/18 0416) SpO2:  [92 %-98 %] 98 % (02/18 0837)  Intake/Output from previous day: 02/17 0701 - 02/18 0700 In: 480 [P.O.:480] Out: 925 [Urine:925] Intake/Output from this shift: Total I/O In: 480 [P.O.:480] Out: 600 [Urine:600]  Physical Exam: Neck: no adenopathy, no carotid bruit, no JVD and supple, symmetrical, trachea midline Lungs: Decreased breath sounds at bases Heart: regular rate and rhythm, S1, S2 normal and Soft systolic murmur noted Abdomen: soft, non-tender; bowel sounds normal; no masses,  no organomegaly Extremities: extremities normal, atraumatic, no cyanosis or edema  Lab Results:  Basename 04/10/11 0535 04/09/11 0940  WBC 5.5 5.0  HGB 11.5* 12.2  PLT 170 174    Basename 04/09/11 0700  NA 140  K 4.3  CL 105  CO2 26  GLUCOSE 152*  BUN 27*  CREATININE 1.08    Basename 04/09/11 0945 04/09/11 0645  TROPONINI <0.30 <0.30   Hepatic Function Panel No results found for this basename: PROT,ALBUMIN,AST,ALT,ALKPHOS,BILITOT,BILIDIR,IBILI in the last 72 hours  Basename 04/09/11 0700  CHOL 219*   No results found for this basename: PROTIME in the last 72 hours  Imaging: Imaging results have been reviewed and No results found.  Cardiac Studies:  Assessment/Plan:  Atypical chest pain MI ruled out Nonischemic cardiomyopathy compensated systolic heart failure Resolving exacerbation of COPD Hypertension Tobacco abuse Polysubstance abuse History of schizophrenia History of CVA of breast Plan Continue present management possible discharge tomorrow if stable  LOS: 3 days     [name] N 04/11/2011, 6:19 PM

## 2011-04-11 NOTE — Progress Notes (Signed)
UR Completed. Simmons, Perris Tripathi F [phone]  

## 2011-04-12 NOTE — Progress Notes (Signed)
Patient sleeping and had short run v-tach, then back S,R, R.N.aware . Pt. Has hx. Of C.M. E.F. 20-25%. Cont. To monitor patient and rhythm.

## 2011-04-12 NOTE — Progress Notes (Signed)
Dr. Sharyn Lull called and made aware patient could not get a ride home tonight and that family will be here in a.m. To pick her up. Dr Sharyn Lull state, " O.K."

## 2011-04-12 NOTE — Discharge Instructions (Signed)
Acute Coronary Syndrome °Acute coronary syndrome (ACS) is an urgent problem in which the blood and oxygen supply to the heart is critically deficient. ACS requires hospitalization because one or more coronary arteries may be blocked. °ACS represents a range of conditions including: °· Previous angina that is now unstable, lasts longer, happens at rest, or is more intense.  °· A heart attack, with heart muscle cell injury and death.  °There are three vital coronary arteries that supply the heart muscle with blood and oxygen so that it can pump blood effectively. If blockages to these arteries develop, blood flow to the heart muscle is reduced. If the heart does not get enough blood, angina may occur as the first warning sign. °SYMPTOMS  °· The most common signs of angina include:  °· Tightness or squeezing in the chest.  °· Feeling of heaviness on the chest.  °· Discomfort in the arms, neck, or jaw.  °· Shortness of breath and nausea.  °· Cold, wet skin.  °· Angina is usually brought on by physical effort or excitement which increase the oxygen needs of the heart. These states increase the blood flow needs of the heart beyond what can be delivered.  °TREATMENT  °· Medicines to help discomfort may include nitroglycerin (nitro) in the form of tablets or a spray for rapid relief, or longer-acting forms such as cream, patches, or capsules. (Be aware that there are many side effects and possible interactions with other drugs).  °· Other medicines may be used to help the heart pump better.  °· Procedures to open blocked arteries including angioplasty or stent placement to keep the arteries open.  °· Open heart surgery may be needed when there are many blockages or they are in critical locations that are best treated with surgery.  °HOME CARE INSTRUCTIONS  °· Avoid smoking.  °· Take one baby or adult aspirin daily, if your caregiver advises. This helps reduce the risk of a heart attack.  °· It is very important that you  follow the angina treatment prescribed by your caregiver. Make arrangements for proper follow-up care.  °· Eat a heart healthy diet with salt and fat restrictions as advised.  °· Regular exercise is good for you as long as it does not cause discomfort. Do not begin any new type of exercise until you check with your caregiver.  °· If you are overweight, you should lose weight.  °· Try to maintain normal blood lipid levels.  °· Keep your blood pressure under control as recommended by your caregiver.  °· You should tell your caregiver right away about any increase in the severity or frequency of your chest discomfort or angina attacks. When you have angina, you should stop what you are doing and sit down. This may bring relief in 3 to 5 minutes. If your caregiver has prescribed nitro, take it as directed.  °· If your caregiver has given you a follow-up appointment, it is very important to keep that appointment. Not keeping the appointment could result in a chronic or permanent injury, pain, and disability. If there is any problem keeping the appointment, you must call back to this facility for assistance.  °SEEK IMMEDIATE MEDICAL CARE IF:  °· You develop nausea, vomiting, or shortness of breath.  °· You feel faint, lightheaded, or pass out.  °· Your chest discomfort gets worse.  °· You are sweating or experience sudden profound fatigue.  °· You do not get relief of your chest pain after 3 doses   of nitro.   Your discomfort lasts longer than 15 minutes.  MAKE SURE YOU:   Understand these instructions.   Will watch your condition.   Will get help right away if you are not doing well or get worse.  Document Released: 02/07/2005 Document Revised: 10/20/2010 Document Reviewed: 09/11/2007 [facility] Patient Information 2012 [place], [place].Acute Coronary Syndrome Acute coronary syndrome (ACS) is an urgent problem in which the blood and oxygen supply to the heart is critically deficient. ACS requires hospitalization  because one or more coronary arteries may be blocked. ACS represents a range of conditions including:  Previous angina that is now unstable, lasts longer, happens at rest, or is more intense.   A heart attack, with heart muscle cell injury and death.  There are three vital coronary arteries that supply the heart muscle with blood and oxygen so that it can pump blood effectively. If blockages to these arteries develop, blood flow to the heart muscle is reduced. If the heart does not get enough blood, angina may occur as the first warning sign. SYMPTOMS   The most common signs of angina include:   Tightness or squeezing in the chest.   Feeling of heaviness on the chest.   Discomfort in the arms, neck, or jaw.   Shortness of breath and nausea.   Cold, wet skin.   Angina is usually brought on by physical effort or excitement which increase the oxygen needs of the heart. These states increase the blood flow needs of the heart beyond what can be delivered.  TREATMENT   Medicines to help discomfort may include nitroglycerin (nitro) in the form of tablets or a spray for rapid relief, or longer-acting forms such as cream, patches, or capsules. (Be aware that there are many side effects and possible interactions with other drugs).   Other medicines may be used to help the heart pump better.   Procedures to open blocked arteries including angioplasty or stent placement to keep the arteries open.   Open heart surgery may be needed when there are many blockages or they are in critical locations that are best treated with surgery.  HOME CARE INSTRUCTIONS   Avoid smoking.   Take one baby or adult aspirin daily, if your caregiver advises. This helps reduce the risk of a heart attack.   It is very important that you follow the angina treatment prescribed by your caregiver. Make arrangements for proper follow-up care.   Eat a heart healthy diet with salt and fat restrictions as advised.    Regular exercise is good for you as long as it does not cause discomfort. Do not begin any new type of exercise until you check with your caregiver.   If you are overweight, you should lose weight.   Try to maintain normal blood lipid levels.   Keep your blood pressure under control as recommended by your caregiver.   You should tell your caregiver right away about any increase in the severity or frequency of your chest discomfort or angina attacks. When you have angina, you should stop what you are doing and sit down. This may bring relief in 3 to 5 minutes. If your caregiver has prescribed nitro, take it as directed.   If your caregiver has given you a follow-up appointment, it is very important to keep that appointment. Not keeping the appointment could result in a chronic or permanent injury, pain, and disability. If there is any problem keeping the appointment, you must call back to this facility  for assistance.  SEEK IMMEDIATE MEDICAL CARE IF:   You develop nausea, vomiting, or shortness of breath.   You feel faint, lightheaded, or pass out.   Your chest discomfort gets worse.   You are sweating or experience sudden profound fatigue.   You do not get relief of your chest pain after 3 doses of nitro.   Your discomfort lasts longer than 15 minutes.  MAKE SURE YOU:   Understand these instructions.   Will watch your condition.   Will get help right away if you are not doing well or get worse.  Document Released: 02/07/2005 Document Revised: 10/20/2010 Document Reviewed: 09/11/2007 [facility] Patient Information 2012 [place], [place].

## 2011-04-12 NOTE — Discharge Summary (Signed)
  Discharge summary dictated on 04/12/2011 dictation number is [phone]

## 2011-04-13 MED ORDER — ALUM & MAG HYDROXIDE-SIMETH 200-200-20 MG/5ML PO SUSP
30.0000 mL | ORAL | Status: DC | PRN
  Administered 2011-04-13: 30 mL via ORAL
  Filled 2011-04-13: qty 30

## 2011-04-13 NOTE — Discharge Summary (Signed)
[name], [name] NO.:  [id number]  MEDICAL RECORD NO.:  [id number]  LOCATION:  2013                         FACILITY:  [facility]  PHYSICIAN:  Eduardo Osier. Sharyn Lull, M.D. DATE OF BIRTH:  Feb 11, 1946  DATE OF ADMISSION:  04/08/2011 DATE OF DISCHARGE:  04/12/2011                              DISCHARGE SUMMARY   ADMITTING DIAGNOSES:  Chest pain, rule out myocardial infarction, chronic obstructive pulmonary disease, asthma, hypertension, dementia, coronary artery disease, cardiomyopathy, history of left breast cancer.  FINAL DIAGNOSES: 1. Status post atypical chest pain, myocardial infarction ruled out,     severe nonischemic dilated cardiomyopathy, compensated systolic     heart failure. 2. 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 3. Hypertension. 4. Tobacco abuse. 5. Polysubstance abuse. 6. History of schizophrenia. 7. Mild renal insufficiency. 8. History of left breast cancer. 9. History of dementia.  DISCHARGE HOME MEDICATIONS: 1. Enteric-coated aspirin 81 mg 1 tablet daily. 2. Clopidogrel 75 mg 1 tablet daily. 3. Carvedilol 3.125 mg 1 tablet twice daily. 4. Lasix 40 mg daily. 5. Imdur 30 mg 1 tablet daily. 6. Albuterol inhaler 1 puff every 6 hours as needed as before. 7. Symbicort 2 puffs twice daily as before. 8. Colace 100 mg twice daily as needed for constipation. 9. Femara 2.5 mg daily as before. 10.Nitrostat 0.4 mg q.5 minutes as needed for chest pain as before. 11.Zyprexa 2.5 mg 1 tablet daily at bedtime. 12.Benicar 5 mg daily by mouth as before. 13.Omeprazole 20 mg 1 capsule daily. 14.Proventil 90 mcg 2 puffs every 6 hours. 15.Seroquel 50 mg at bedtime. 16.Simvastatin 10 mg 1 tablet daily, 20 mg daily at bedtime. 17.Spiriva 18 mcg 1 puff daily. 18.Trazodone 50 mg 1 tablet daily. 19.Ambien 5 mg by mouth  daily as needed for insomnia.  DIET:  Low salt, low cholesterol.  ACTIVITY:  As tolerated.  The patient has been advised to refrain from smoking and drug abuse.  Follow up with me in 1 week.  CONDITION AT DISCHARGE:  Stable.  BRIEF HISTORY AND HOSPITAL COURSE:  [name] is a 66 year old female with past medical history significant for multiple medical problems, i.e., coronary artery disease, history of non-Q-wave MI in the past secondary to vasospasms, secondary to cocaine abuse, hypertension, CHF secondary to systolic dysfunction, COPD, hypercholesteremia, polysubstance abuse, mild renal insufficiency, also history of breast cancer was admitted by Dr. Algie Coffer on February 15 because of progressive increasing shortness of breath and chest tightness with abnormal T-wave inversions on EKG.  The patient also has history of asthma, received breathing treatment for wheezing in the ER with improvement in her breathing.  The patient currently denies any chest pain.  PAST MEDICAL HISTORY:  As above.  PHYSICAL EXAMINATION:  VITAL SIGNS:  Her blood pressure was 155/84, pulse was 87.  She was afebrile.  NECK:  Supple.  No JVD.  No bruit. LUNGS:  She had bilateral wheezing.  No rales.  S1, S2 was normal.  No murmur.  No gallop. ABDOMEN:  Soft without significant tenderness, masses, or organomegaly. EXTREMITIES:  There is no clubbing, cyanosis, or edema.  LABORATORY DATA:  Sodium was 140, potassium 4.7, BUN 26, creatinine 1.10.  Four sets of troponin I were negative, less than 0.30 x4, CK was 136, MB 4.1.  Second set CK 132, MB 4.1.  Third set CK 123, MB 4.2. Fourth set CK 117, MB 4.1.  Her cholesterol was 219, LDL 114, HDL was 99, triglycerides were 32, hemoglobin was 14.3, hematocrit 42.7, white count of 4.3.  BRIEF HOSPITAL COURSE:  The patient was admitted to telemetry unit.  MI was ruled out by serial enzymes and EKG.  The patient was restarted on her home medications and breathing  treatments with improvement in her symptoms.  The patient had not had any further episodes of chest pain during the hospital stay.  The patient had cardiac cath in the past, which showed nonischemic dilated cardiomyopathy.  The patient has been ambulating in hallway without any problems and will be discharged home on above medications and will be followed up in my office in 1 week.     Eduardo Osier. Sharyn Lull, M.D.     [facility]  D:  04/12/2011  T:  04/13/2011  Job:  [id number]

## 2011-04-18 ENCOUNTER — Ambulatory Visit: Payer: Medicare Other

## 2011-04-18 NOTE — Progress Notes (Signed)
Patient seen for genetic counseling. Recommended BRCA1/2 testing and offered to coordinate an appointment at [facility] for BRCA testing since the patient is Medicaid. Patient declined today but will consider it.

## 2011-05-25 ENCOUNTER — Other Ambulatory Visit: Payer: Self-pay | Admitting: Cardiology

## 2011-05-28 IMAGING — CR DG RIBS 2V*L*
3 series · 3 of 3 positions shown · non-contrast
Comparison: 05/21/2010

CLINICAL DATA: Left lower rib pain.

LEFT RIBS - 2 VIEW

[w ribs ap/pa upper left]
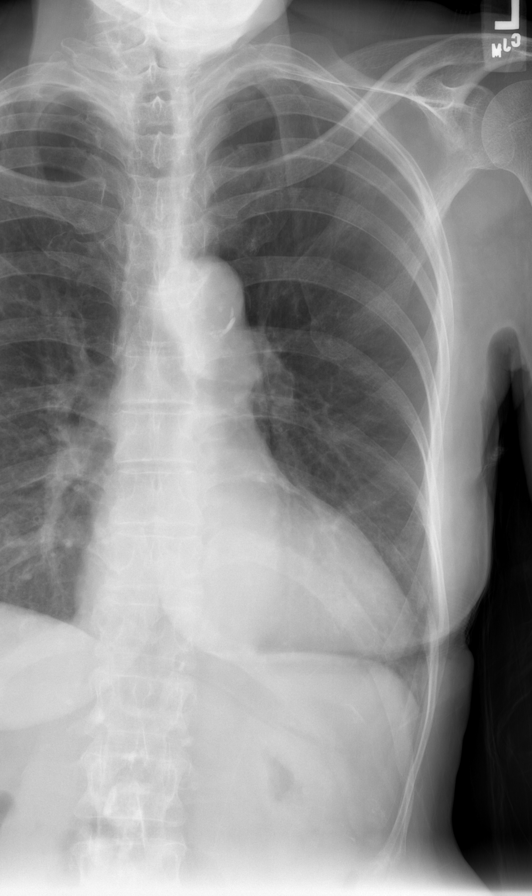

[w ribs oblique left]
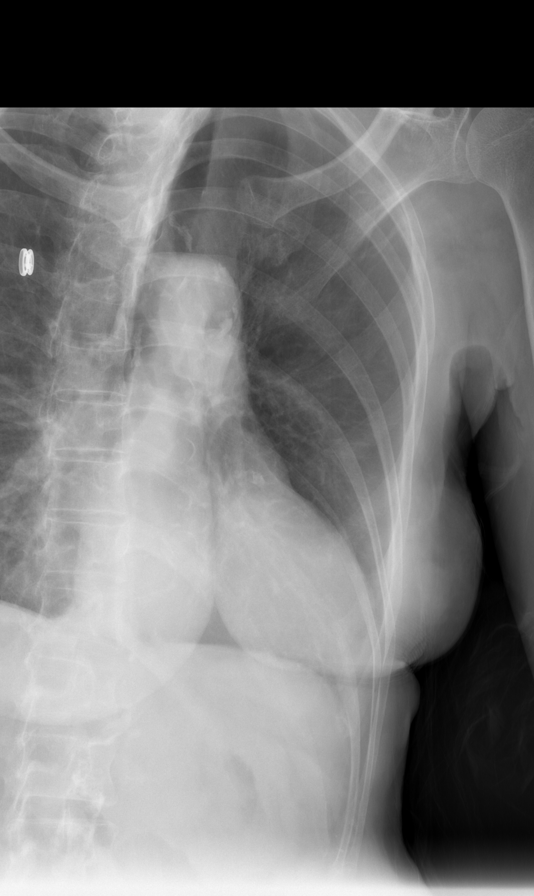

[w ribs ap/pa lower left]
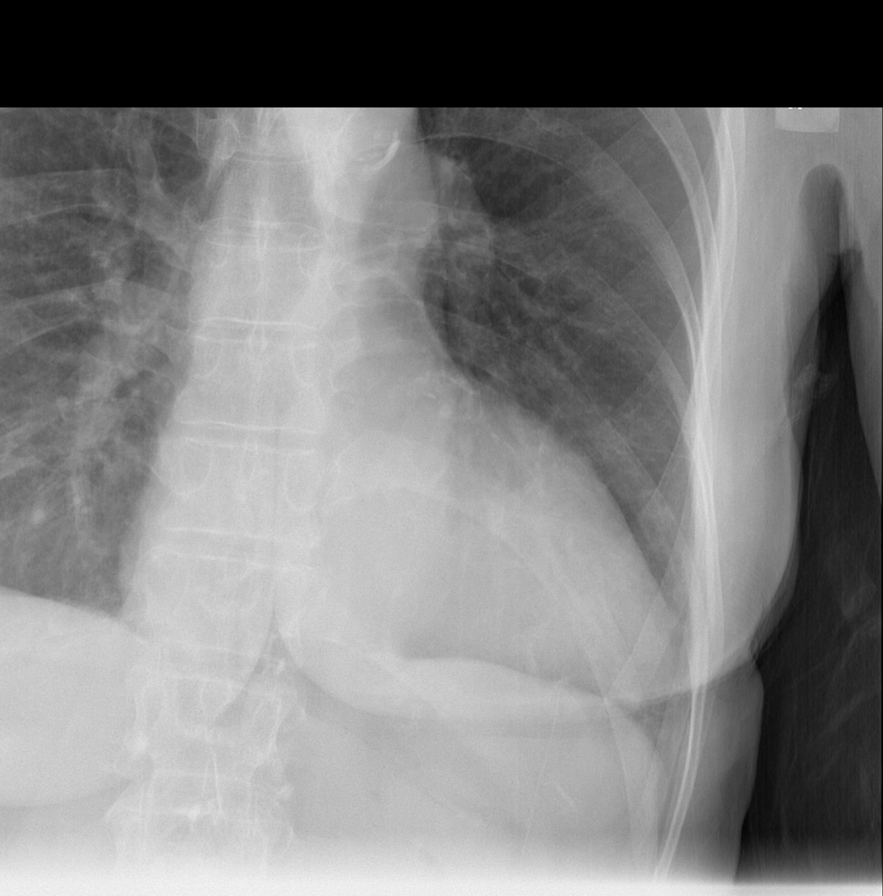

[3 of 3 positions shown; findings below may reference images not displayed]

FINDINGS: No acute bony abnormality.  No evidence of left rib
fracture.  No pneumothorax.  Visualized left lung is clear.
IMPRESSION: No acute findings.

## 2011-05-31 ENCOUNTER — Other Ambulatory Visit: Payer: Medicare Other | Admitting: Lab

## 2011-05-31 ENCOUNTER — Ambulatory Visit: Payer: Medicare Other | Admitting: Oncology

## 2011-06-03 ENCOUNTER — Ambulatory Visit: Payer: Medicare Other | Admitting: Oncology

## 2011-06-03 ENCOUNTER — Other Ambulatory Visit: Payer: Medicare Other | Admitting: Lab

## 2011-06-15 ENCOUNTER — Emergency Department (HOSPITAL_COMMUNITY)
Admission: EM | Admit: 2011-06-15 | Discharge: 2011-06-15 | Disposition: A | Discharge status: Home / Self Care | Payer: Medicare Other | Attending: Emergency Medicine | Admitting: Emergency Medicine

## 2011-06-15 ENCOUNTER — Emergency Department (HOSPITAL_COMMUNITY): Payer: Medicare Other

## 2011-06-15 DIAGNOSIS — G4733 Obstructive sleep apnea (adult) (pediatric): Secondary | ICD-10-CM | POA: Insufficient documentation

## 2011-06-15 DIAGNOSIS — I10 Essential (primary) hypertension: Secondary | ICD-10-CM | POA: Insufficient documentation

## 2011-06-15 DIAGNOSIS — J449 Chronic obstructive pulmonary disease, unspecified: Secondary | ICD-10-CM | POA: Insufficient documentation

## 2011-06-15 DIAGNOSIS — J4489 Other specified chronic obstructive pulmonary disease: Secondary | ICD-10-CM | POA: Insufficient documentation

## 2011-06-15 DIAGNOSIS — F068 Other specified mental disorders due to known physiological condition: Secondary | ICD-10-CM | POA: Insufficient documentation

## 2011-06-15 DIAGNOSIS — Z853 Personal history of malignant neoplasm of breast: Secondary | ICD-10-CM | POA: Insufficient documentation

## 2011-06-15 DIAGNOSIS — Z8673 Personal history of transient ischemic attack (TIA), and cerebral infarction without residual deficits: Secondary | ICD-10-CM | POA: Insufficient documentation

## 2011-06-15 DIAGNOSIS — G40909 Epilepsy, unspecified, not intractable, without status epilepticus: Secondary | ICD-10-CM | POA: Insufficient documentation

## 2011-06-15 DIAGNOSIS — F209 Schizophrenia, unspecified: Secondary | ICD-10-CM | POA: Insufficient documentation

## 2011-06-15 DIAGNOSIS — Z7982 Long term (current) use of aspirin: Secondary | ICD-10-CM | POA: Insufficient documentation

## 2011-06-15 DIAGNOSIS — I509 Heart failure, unspecified: Secondary | ICD-10-CM | POA: Insufficient documentation

## 2011-06-15 DIAGNOSIS — I2510 Atherosclerotic heart disease of native coronary artery without angina pectoris: Secondary | ICD-10-CM | POA: Insufficient documentation

## 2011-06-15 DIAGNOSIS — Z79899 Other long term (current) drug therapy: Secondary | ICD-10-CM | POA: Insufficient documentation

## 2011-06-15 DIAGNOSIS — F411 Generalized anxiety disorder: Secondary | ICD-10-CM

## 2011-06-15 DIAGNOSIS — R0602 Shortness of breath: Secondary | ICD-10-CM | POA: Insufficient documentation

## 2011-06-15 LAB — CBC
MCH: 29.6 pg (ref 26.0–34.0)
MCHC: 32 g/dL (ref 30.0–36.0)
Platelets: 213 10*3/uL (ref 150–400)

## 2011-06-15 LAB — RAPID URINE DRUG SCREEN, HOSP PERFORMED
Amphetamines: NOT DETECTED
Barbiturates: NOT DETECTED
Benzodiazepines: NOT DETECTED
Cocaine: NOT DETECTED
Opiates: NOT DETECTED
Tetrahydrocannabinol: NOT DETECTED

## 2011-06-15 LAB — COMPREHENSIVE METABOLIC PANEL
ALT: 5 U/L (ref 0–35)
Albumin: 3.6 g/dL (ref 3.5–5.2)
Alkaline Phosphatase: 113 U/L (ref 39–117)
BUN: 23 mg/dL (ref 6–23)
Calcium: 9.6 mg/dL (ref 8.4–10.5)
Potassium: 3.9 mEq/L (ref 3.5–5.1)
Sodium: 144 mEq/L (ref 135–145)
Total Protein: 7.6 g/dL (ref 6.0–8.3)

## 2011-06-15 LAB — DIFFERENTIAL
Basophils Relative: 1 % (ref 0–1)
Eosinophils Absolute: 0.1 10*3/uL (ref 0.0–0.7)
Eosinophils Relative: 3 % (ref 0–5)
Neutrophils Relative %: 73 % (ref 43–77)

## 2011-06-15 MED ORDER — ALBUTEROL SULFATE HFA 108 (90 BASE) MCG/ACT IN AERS: 2.0000 | INHALATION_SPRAY | RESPIRATORY_TRACT | Status: DC | PRN

## 2011-06-15 MED ORDER — ALBUTEROL SULFATE (5 MG/ML) 0.5% IN NEBU
5.0000 mg | INHALATION_SOLUTION | Freq: Once | RESPIRATORY_TRACT | Status: AC
  Administered 2011-06-15: 5 mg via RESPIRATORY_TRACT
  Filled 2011-06-15: qty 1

## 2011-06-15 MED ORDER — IPRATROPIUM BROMIDE 0.02 % IN SOLN
0.5000 mg | Freq: Once | RESPIRATORY_TRACT | Status: AC
  Administered 2011-06-15: 0.5 mg via RESPIRATORY_TRACT
  Filled 2011-06-15: qty 2.5

## 2011-06-15 NOTE — Discharge Instructions (Signed)

## 2011-06-15 NOTE — ED Notes (Signed)
[facility] made aware of need for transport back to residence

## 2011-06-15 NOTE — ED Notes (Signed)
Per Diplomatic Services operational officer, pt's niece called back and expressed concern over not being called to pick pt up at time of d/c; pt was verbally aggressive during conversation per Diplomatic Services operational officer;  Attempted to call pt's niece back and discuss pt's disposition with her; however, got voice mail; at time of d/c, pt was asked how she would be getting home; she replied that "those people" take her home; when asked who, she was uncertain, asked if she went home by bus or cab, she stated no; when asked if she went home with transport service, she stated yes; further asked pt who she lived with; she replied, "My niece"; when asked if her niece would be able to pick her up, she replied, "She can't, she doesn't drive"; [facility] subsequently notified for transport home - pt in agreement

## 2011-06-15 NOTE — ED Provider Notes (Signed)
History     CSN: [id number]  Arrival date & time 06/15/11  1330   First MD Initiated Contact with Patient 06/15/11 1338      Chief Complaint  Patient presents with  . Shortness of Breath     HPI Per EMS Pt stated she had a sudden on set of shortness of breath. She took her prescribed albuterol inhaler with relief. BS clear upon EMS assessment, no signs of respiratory distress.  Patient states that she ran out of her albuterol inhaler.  Patient does not smoke.  Denies any recent cocaine abuse.  I spoke with her doctor who stated she had to clean coronary catheterizations .  Past Medical History  Diagnosis Date  . Weight loss, unintentional   . Trouble swallowing   . Change in voice   . Substance abuse   . Dementia   . Asthma   . Shortness of breath   . Seizures     "    It has been along time "  . CHF (congestive heart failure)   . Headache   . Hypertension   . Arthralgia   . Tremors of nervous system   . Anxiety   . Rib fractures     hx of May 2012  . Heart attack 05/21/10  . Schizophrenia   . Coronary artery disease   . Stroke 07/11/10    hx of R CVA   . COPD (chronic obstructive pulmonary disease)   . Obstructive sleep apnea   . Breast cancer 01/28/11    L breast, inv ductal/in situ, ER/PR -, Her2 -    Past Surgical History  Procedure Date  . Total abdominal hysterectomy age 52    Family History  Problem Relation Age of Onset  . Breast cancer Mother   . Birth defects Mother     breast  . Cancer Mother     breast  . Heart disease Father     heart attack  . Heart attack Father   . Heart attack Brother   . Cancer Brother     throat, lung  . Cancer Paternal Aunt   . Cancer Maternal Grandmother     breast     History  Substance Use Topics  . Smoking status: Former Smoker -- 1.0 packs/day for 50 years    Types: Cigarettes    Quit date: 02/22/2008  . Smokeless tobacco: Never Used   Comment: started smoking at age 12  . Alcohol Use: Yes     social  use    OB History    Grav Para Term Preterm Abortions TAB SAB Ect Mult Living                  Review of Systems All remaining systems negative Allergies  Codeine; Penicillins; and Hydralazine  Home Medications   Current Outpatient Rx  Name Route Sig Dispense Refill  . ALBUTEROL SULFATE (2.5 MG/3ML) 0.083% IN NEBU Nebulization Take 2.5 mg by nebulization every 6 (six) hours as needed. For wheezing    . ALBUTEROL 90 MCG/ACT IN AERS Inhalation Inhale 2 puffs into the lungs every 6 (six) hours as needed. For wheezing     . ASPIRIN 81 MG PO TABS Oral Take 81 mg by mouth daily.      . BUDESONIDE-FORMOTEROL FUMARATE 80-4.5 MCG/ACT IN AERO Inhalation Inhale 2 puffs into the lungs 2 (two) times daily. 1 Inhaler 1  . CARVEDILOL 3.125 MG PO TABS Oral Take 3.125 mg by mouth 2 (  two) times daily with a meal.      . CLOPIDOGREL BISULFATE 75 MG PO TABS Oral Take 75 mg by mouth daily.      Marland Kitchen DOCUSATE SODIUM 100 MG PO CAPS Oral Take 100 mg by mouth 2 (two) times daily.      . FUROSEMIDE 40 MG PO TABS Oral Take 40 mg by mouth daily.      . ISOSORBIDE MONONITRATE ER 30 MG PO TB24 Oral Take 30 mg by mouth daily.      Marland Kitchen LETROZOLE 2.5 MG PO TABS Oral Take 2.5 mg by mouth daily.    Marland Kitchen NITROGLYCERIN 0.4 MG SL SUBL Sublingual Place 0.4 mg under the tongue every 5 (five) minutes as needed.      Marland Kitchen OLANZAPINE 2.5 MG PO TABS Oral Take 2.5 mg by mouth at bedtime.      Marland Kitchen OLMESARTAN MEDOXOMIL 5 MG PO TABS Oral Take 1 tablet (5 mg total) by mouth daily. 30 tablet 1  . OMEPRAZOLE 20 MG PO CPDR Oral Take 20 mg by mouth daily.      . QUETIAPINE FUMARATE 50 MG PO TABS Oral Take 50 mg by mouth at bedtime.      Marland Kitchen SIMVASTATIN 10 MG PO TABS Oral Take 20 mg by mouth at bedtime.     Marland Kitchen TIOTROPIUM BROMIDE MONOHYDRATE 18 MCG IN CAPS Inhalation Place 18 mcg into inhaler and inhale daily.      . TRAZODONE HCL 50 MG PO TABS Oral Take 50 mg by mouth at bedtime.      Marland Kitchen ZOLPIDEM TARTRATE 5 MG PO TABS Oral Take 5 mg by mouth at  bedtime as needed.      BP 109/91  Pulse 63  Temp(Src) 97.8 F (36.6 C) (Oral)  Resp 20  SpO2 100%  Physical Exam  Nursing note and vitals reviewed. Constitutional: She appears well-developed and well-nourished. No distress.  HENT:  Head: Normocephalic and atraumatic.  Eyes: Pupils are equal, round, and reactive to light.  Neck: Normal range of motion.  Cardiovascular: Normal rate, regular rhythm and intact distal pulses.         SINUS RHYTHM ~ normal P axis, V-rate 69 LEFT BUNDLE BRANCH BLOCK ~  No significant change when compared with previous tracing  Pulmonary/Chest: No respiratory distress. She has wheezes. She has no rales.  Abdominal: Normal appearance. She exhibits no distension. There is no tenderness. There is no rebound.  Musculoskeletal: Normal range of motion.  Neurological: She is alert. No cranial nerve deficit.  Skin: Skin is warm and dry. No rash noted.  Psychiatric: She has a normal mood and affect. Her behavior is normal.    ED Course  Procedures (including critical care time)  Scheduled Meds:    . albuterol  5 mg Nebulization Once  . ipratropium  0.5 mg Nebulization Once   Continuous Infusions:  PRN Meds:.   Labs Reviewed  DIFFERENTIAL - Abnormal; Notable for the following:    Lymphs Abs 0.6 (*)    All other components within normal limits  COMPREHENSIVE METABOLIC PANEL - Abnormal; Notable for the following:    Creatinine, Ser 1.14 (*)    Total Bilirubin 0.2 (*)    GFR calc non Af Amer 49 (*)    GFR calc Af Amer 57 (*)    All other components within normal limits  PRO B NATRIURETIC PEPTIDE - Abnormal; Notable for the following:    Pro B Natriuretic peptide (BNP) 1378.0 (*)    All other  components within normal limits  POCT I-STAT TROPONIN I - Abnormal; Notable for the following:    Troponin i, poc 0.14 (*)    All other components within normal limits  CBC  PROTIME-INR  URINE RAPID DRUG SCREEN (HOSP PERFORMED)   Dg Chest Portable 1  View  06/15/2011  *RADIOLOGY REPORT*  Clinical Data: Difficulty breathing.  Shortness of breath.  PORTABLE CHEST - 1 VIEW  Comparison: Portable chest 04/08/2011.  Findings: Cardiac enlargement is stable.  Emphysematous changes are again noted.  No focal airspace disease is evident.  There is no edema or effusion.  Multiple remote left-sided rib fractures are again noted.  The visualized soft tissues and bony thorax are unremarkable.  IMPRESSION:  1.  Stable cardiomegaly without failure. 2.  Emphysema. 3.  No acute cardiopulmonary disease.  Original Report Authenticated By: Jamesetta Orleans. MATTERN, M.D.     1. COPD (chronic obstructive pulmonary disease)   2. SCHIZOPHRENIA   3. ANXIETY       MDM  I spoke on the phone with her doctor who stated that if she doesn't desat with ambulation and returns to baseline she can go home and followup with him in the office his week        Nelia Shi, MD 06/15/11 1540

## 2011-06-15 NOTE — ED Notes (Signed)
Ambulated pt with no assistance.  Pt O2 ranged from 93 to 98 while ambulating

## 2011-06-15 NOTE — ED Notes (Signed)
Per EMS Pt stated she had a sudden on set of shortness of breath. She took her prescribed albuterol inhaler with relief. BS clear upon EMS assessment, no signs of respiratory distress.

## 2011-08-22 ENCOUNTER — Other Ambulatory Visit: Payer: Self-pay | Admitting: Cardiology

## 2011-08-29 ENCOUNTER — Other Ambulatory Visit: Payer: Self-pay | Admitting: Cardiology

## 2011-10-06 ENCOUNTER — Emergency Department (HOSPITAL_COMMUNITY): Payer: Medicare Other

## 2011-10-06 ENCOUNTER — Inpatient Hospital Stay (HOSPITAL_COMMUNITY)
Admission: EM | Admit: 2011-10-06 | Discharge: 2011-10-11 | DRG: 064 | Disposition: A | Discharge status: Rehabilitation Facility | Payer: Medicare Other | Attending: Cardiology | Admitting: Cardiology

## 2011-10-06 ENCOUNTER — Encounter (HOSPITAL_COMMUNITY): Payer: Self-pay | Admitting: *Deleted

## 2011-10-06 DIAGNOSIS — J449 Chronic obstructive pulmonary disease, unspecified: Secondary | ICD-10-CM | POA: Diagnosis present

## 2011-10-06 DIAGNOSIS — Z880 Allergy status to penicillin: Secondary | ICD-10-CM

## 2011-10-06 DIAGNOSIS — Z23 Encounter for immunization: Secondary | ICD-10-CM

## 2011-10-06 DIAGNOSIS — I10 Essential (primary) hypertension: Secondary | ICD-10-CM | POA: Diagnosis present

## 2011-10-06 DIAGNOSIS — I639 Cerebral infarction, unspecified: Secondary | ICD-10-CM

## 2011-10-06 DIAGNOSIS — E7800 Pure hypercholesterolemia, unspecified: Secondary | ICD-10-CM | POA: Diagnosis present

## 2011-10-06 DIAGNOSIS — Z888 Allergy status to other drugs, medicaments and biological substances status: Secondary | ICD-10-CM

## 2011-10-06 DIAGNOSIS — Z853 Personal history of malignant neoplasm of breast: Secondary | ICD-10-CM

## 2011-10-06 DIAGNOSIS — I428 Other cardiomyopathies: Secondary | ICD-10-CM | POA: Diagnosis present

## 2011-10-06 DIAGNOSIS — F17200 Nicotine dependence, unspecified, uncomplicated: Secondary | ICD-10-CM | POA: Diagnosis present

## 2011-10-06 DIAGNOSIS — I69959 Hemiplegia and hemiparesis following unspecified cerebrovascular disease affecting unspecified side: Secondary | ICD-10-CM

## 2011-10-06 DIAGNOSIS — I509 Heart failure, unspecified: Secondary | ICD-10-CM | POA: Diagnosis present

## 2011-10-06 DIAGNOSIS — F0390 Unspecified dementia without behavioral disturbance: Secondary | ICD-10-CM | POA: Diagnosis present

## 2011-10-06 DIAGNOSIS — F1410 Cocaine abuse, uncomplicated: Secondary | ICD-10-CM | POA: Diagnosis present

## 2011-10-06 DIAGNOSIS — K5900 Constipation, unspecified: Secondary | ICD-10-CM | POA: Diagnosis present

## 2011-10-06 DIAGNOSIS — R64 Cachexia: Secondary | ICD-10-CM | POA: Diagnosis present

## 2011-10-06 DIAGNOSIS — Z79899 Other long term (current) drug therapy: Secondary | ICD-10-CM

## 2011-10-06 DIAGNOSIS — Z8249 Family history of ischemic heart disease and other diseases of the circulatory system: Secondary | ICD-10-CM

## 2011-10-06 DIAGNOSIS — G4733 Obstructive sleep apnea (adult) (pediatric): Secondary | ICD-10-CM | POA: Diagnosis present

## 2011-10-06 DIAGNOSIS — Z7982 Long term (current) use of aspirin: Secondary | ICD-10-CM

## 2011-10-06 DIAGNOSIS — Z803 Family history of malignant neoplasm of breast: Secondary | ICD-10-CM

## 2011-10-06 DIAGNOSIS — I5022 Chronic systolic (congestive) heart failure: Secondary | ICD-10-CM | POA: Diagnosis present

## 2011-10-06 DIAGNOSIS — F209 Schizophrenia, unspecified: Secondary | ICD-10-CM | POA: Diagnosis present

## 2011-10-06 DIAGNOSIS — I6350 Cerebral infarction due to unspecified occlusion or stenosis of unspecified cerebral artery: Principal | ICD-10-CM | POA: Diagnosis present

## 2011-10-06 DIAGNOSIS — Z681 Body mass index (BMI) 19 or less, adult: Secondary | ICD-10-CM

## 2011-10-06 DIAGNOSIS — Z800 Family history of malignant neoplasm of digestive organs: Secondary | ICD-10-CM

## 2011-10-06 DIAGNOSIS — I252 Old myocardial infarction: Secondary | ICD-10-CM

## 2011-10-06 DIAGNOSIS — J4489 Other specified chronic obstructive pulmonary disease: Secondary | ICD-10-CM | POA: Diagnosis present

## 2011-10-06 DIAGNOSIS — Z801 Family history of malignant neoplasm of trachea, bronchus and lung: Secondary | ICD-10-CM

## 2011-10-06 DIAGNOSIS — I447 Left bundle-branch block, unspecified: Secondary | ICD-10-CM | POA: Diagnosis present

## 2011-10-06 DIAGNOSIS — I619 Nontraumatic intracerebral hemorrhage, unspecified: Secondary | ICD-10-CM | POA: Diagnosis present

## 2011-10-06 LAB — CBC WITH DIFFERENTIAL/PLATELET
Basophils Absolute: 0 10*3/uL (ref 0.0–0.1)
Basophils Relative: 1 % (ref 0–1)
Eosinophils Absolute: 0 10*3/uL (ref 0.0–0.7)
Eosinophils Relative: 1 % (ref 0–5)
HCT: 45.9 % (ref 36.0–46.0)
Hemoglobin: 15.3 g/dL — ABNORMAL HIGH (ref 12.0–15.0)
MCH: 30.1 pg (ref 26.0–34.0)
MCHC: 33.3 g/dL (ref 30.0–36.0)
MCV: 90.4 fL (ref 78.0–100.0)
Monocytes Absolute: 0.5 10*3/uL (ref 0.1–1.0)
Monocytes Relative: 11 % (ref 3–12)
Neutro Abs: 2.9 10*3/uL (ref 1.7–7.7)
RDW: 13.8 % (ref 11.5–15.5)

## 2011-10-06 LAB — URINALYSIS, ROUTINE W REFLEX MICROSCOPIC
Ketones, ur: NEGATIVE mg/dL
Leukocytes, UA: NEGATIVE
Nitrite: NEGATIVE
Protein, ur: NEGATIVE mg/dL
pH: 7 (ref 5.0–8.0)

## 2011-10-06 LAB — GLUCOSE, CAPILLARY: Glucose-Capillary: 93 mg/dL (ref 70–99)

## 2011-10-06 LAB — COMPREHENSIVE METABOLIC PANEL
Albumin: 3.9 g/dL (ref 3.5–5.2)
BUN: 10 mg/dL (ref 6–23)
Calcium: 9.9 mg/dL (ref 8.4–10.5)
Chloride: 99 mEq/L (ref 96–112)
Creatinine, Ser: 1.01 mg/dL (ref 0.50–1.10)
Total Bilirubin: 0.6 mg/dL (ref 0.3–1.2)

## 2011-10-06 LAB — LIPASE, BLOOD: Lipase: 31 U/L (ref 11–59)

## 2011-10-06 LAB — PROTIME-INR: INR: 0.96 (ref 0.00–1.49)

## 2011-10-06 MED ORDER — FAMOTIDINE 20 MG PO TABS
20.0000 mg | ORAL_TABLET | Freq: Two times a day (BID) | ORAL | Status: DC
  Administered 2011-10-06 – 2011-10-11 (×10): 20 mg via ORAL
  Filled 2011-10-06 (×12): qty 1

## 2011-10-06 MED ORDER — SENNOSIDES-DOCUSATE SODIUM 8.6-50 MG PO TABS
1.0000 | ORAL_TABLET | Freq: Every evening | ORAL | Status: DC | PRN
  Administered 2011-10-07 – 2011-10-09 (×3): 1 via ORAL
  Filled 2011-10-06 (×3): qty 1

## 2011-10-06 MED ORDER — FUROSEMIDE 40 MG PO TABS
40.0000 mg | ORAL_TABLET | Freq: Every day | ORAL | Status: DC
  Administered 2011-10-08 – 2011-10-11 (×4): 40 mg via ORAL
  Filled 2011-10-06 (×6): qty 1

## 2011-10-06 MED ORDER — OLANZAPINE 2.5 MG PO TABS
2.5000 mg | ORAL_TABLET | Freq: Every day | ORAL | Status: DC
  Administered 2011-10-06 – 2011-10-10 (×5): 2.5 mg via ORAL
  Filled 2011-10-06 (×7): qty 1

## 2011-10-06 MED ORDER — ASPIRIN 325 MG PO TABS
325.0000 mg | ORAL_TABLET | Freq: Every day | ORAL | Status: DC
  Administered 2011-10-07: 325 mg via ORAL
  Filled 2011-10-06 (×2): qty 1

## 2011-10-06 MED ORDER — CLOPIDOGREL BISULFATE 75 MG PO TABS
75.0000 mg | ORAL_TABLET | Freq: Every day | ORAL | Status: DC
  Administered 2011-10-07 – 2011-10-10 (×4): 75 mg via ORAL
  Filled 2011-10-06 (×6): qty 1

## 2011-10-06 MED ORDER — BUDESONIDE-FORMOTEROL FUMARATE 80-4.5 MCG/ACT IN AERO
2.0000 | INHALATION_SPRAY | Freq: Two times a day (BID) | RESPIRATORY_TRACT | Status: DC
  Administered 2011-10-07 – 2011-10-11 (×9): 2 via RESPIRATORY_TRACT
  Filled 2011-10-06: qty 6.9

## 2011-10-06 MED ORDER — ISOSORBIDE MONONITRATE ER 30 MG PO TB24
30.0000 mg | ORAL_TABLET | Freq: Every day | ORAL | Status: DC
  Administered 2011-10-08: 30 mg via ORAL
  Filled 2011-10-06 (×4): qty 1

## 2011-10-06 MED ORDER — TIOTROPIUM BROMIDE MONOHYDRATE 18 MCG IN CAPS
18.0000 ug | ORAL_CAPSULE | Freq: Every day | RESPIRATORY_TRACT | Status: DC
  Administered 2011-10-07 – 2011-10-11 (×5): 18 ug via RESPIRATORY_TRACT
  Filled 2011-10-06: qty 5

## 2011-10-06 MED ORDER — HEPARIN SODIUM (PORCINE) 5000 UNIT/ML IJ SOLN
5000.0000 [IU] | Freq: Three times a day (TID) | INTRAMUSCULAR | Status: DC
  Administered 2011-10-06 – 2011-10-11 (×15): 5000 [IU] via SUBCUTANEOUS
  Filled 2011-10-06 (×17): qty 1

## 2011-10-06 MED ORDER — NITROGLYCERIN 0.4 MG SL SUBL: 0.4000 mg | SUBLINGUAL_TABLET | SUBLINGUAL | Status: DC | PRN

## 2011-10-06 MED ORDER — ASPIRIN 300 MG RE SUPP
300.0000 mg | Freq: Every day | RECTAL | Status: DC
  Filled 2011-10-06: qty 1

## 2011-10-06 MED ORDER — TRAZODONE HCL 50 MG PO TABS
50.0000 mg | ORAL_TABLET | Freq: Every day | ORAL | Status: DC
  Administered 2011-10-06 – 2011-10-10 (×5): 50 mg via ORAL
  Filled 2011-10-06 (×7): qty 1

## 2011-10-06 MED ORDER — LEVALBUTEROL HCL 1.25 MG/0.5ML IN NEBU
1.2500 mg | INHALATION_SOLUTION | Freq: Three times a day (TID) | RESPIRATORY_TRACT | Status: DC
  Administered 2011-10-07: 1.25 mg via RESPIRATORY_TRACT
  Filled 2011-10-06 (×5): qty 0.5

## 2011-10-06 MED ORDER — ASPIRIN EC 81 MG PO TBEC: 81.0000 mg | DELAYED_RELEASE_TABLET | Freq: Every day | ORAL | Status: DC

## 2011-10-06 MED ORDER — DOCUSATE SODIUM 100 MG PO CAPS
100.0000 mg | ORAL_CAPSULE | Freq: Two times a day (BID) | ORAL | Status: DC
  Administered 2011-10-06 – 2011-10-10 (×8): 100 mg via ORAL
  Filled 2011-10-06 (×7): qty 1

## 2011-10-06 MED ORDER — CARVEDILOL 3.125 MG PO TABS
3.1250 mg | ORAL_TABLET | Freq: Two times a day (BID) | ORAL | Status: DC
  Administered 2011-10-08 – 2011-10-10 (×4): 3.125 mg via ORAL
  Filled 2011-10-06 (×12): qty 1

## 2011-10-06 MED ORDER — SODIUM CHLORIDE 0.9 % IV SOLN
INTRAVENOUS | Status: DC
  Administered 2011-10-06: 20 mL/h via INTRAVENOUS

## 2011-10-06 MED ORDER — SIMVASTATIN 20 MG PO TABS
20.0000 mg | ORAL_TABLET | Freq: Every day | ORAL | Status: DC
  Administered 2011-10-06 – 2011-10-10 (×5): 20 mg via ORAL
  Filled 2011-10-06 (×8): qty 1

## 2011-10-06 MED ORDER — QUETIAPINE FUMARATE 50 MG PO TABS
50.0000 mg | ORAL_TABLET | Freq: Every day | ORAL | Status: DC
  Administered 2011-10-06 – 2011-10-10 (×5): 50 mg via ORAL
  Filled 2011-10-06 (×7): qty 1

## 2011-10-06 NOTE — H&P (Signed)
[name] is an 66 y.o. female.   Chief Complaint: Altered mental status HPI: Patient is 66 year old female with past medical history significant for multiple medical problems i.e. 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 Patient had CT of the brain in the ER which showed new acute right parietal cortical and subcortical infarct. Patient was out of window for thrombolytic therapy. Patient denies any chest pain shortness of breath nausea or vomiting. Denies any cough fever or chills. Denies any urinary complaints. Patient more awake at present. Only complaints of inability to left her left leg.  Past Medical History  Diagnosis Date  . Weight loss, unintentional   . Trouble swallowing   . Change in voice   . Substance abuse   . Dementia   . Asthma   . Shortness of breath   . Seizures     "    It has been along time "  . CHF (congestive heart failure)   . Headache   . Hypertension   . Arthralgia   . Tremors of nervous system   . Anxiety   . Rib fractures     hx of May 2012  . Heart attack 05/21/10  . Schizophrenia   . Coronary artery disease   . Stroke 07/11/10    hx of R CVA   . COPD (chronic obstructive pulmonary disease)   . Obstructive sleep apnea   . Breast cancer 01/28/11    L breast, inv ductal/in situ, ER/PR -, Her2 -    Past Surgical History  Procedure Date  . Total abdominal hysterectomy age 32    Family History  Problem Relation Age of Onset  . Breast cancer Mother   . Birth defects Mother      breast  . Cancer Mother     breast  . Heart disease Father     heart attack  . Heart attack Father   . Heart attack Brother   . Cancer Brother     throat, lung  . Cancer Paternal Aunt   . Cancer Maternal Grandmother     breast    Social History:  reports that she quit smoking about 3 years ago. Her smoking use included Cigarettes. She has a 50 pack-year smoking history. She has never used smokeless tobacco. She reports that she drinks alcohol. She reports that she uses illicit drugs.  Allergies:  Allergies  Allergen Reactions  . Codeine Itching    All over the body  . Penicillins Hives and Other (See Comments)    "Whelps" per patient.  . Hydralazine     02/28/11 Family unsure if this is allergy for pt     (Not in a hospital admission)  Results for orders placed during the hospital encounter of 10/06/11 (from the past 48 hour(s))  GLUCOSE, CAPILLARY     Status: Normal   Collection Time   10/06/11  5:23 PM      Component Value Range Comment   Glucose-Capillary 93  70 - 99 mg/dL   URINALYSIS, ROUTINE W REFLEX MICROSCOPIC     Status: Normal   Collection Time  10/06/11  5:25 PM      Component Value Range Comment   Color, Urine YELLOW  YELLOW    APPearance CLEAR  CLEAR    Specific Gravity, Urine 1.008  1.005 - 1.030    pH 7.0  5.0 - 8.0    Glucose, UA NEGATIVE  NEGATIVE mg/dL    Hgb urine dipstick NEGATIVE  NEGATIVE    Bilirubin Urine NEGATIVE  NEGATIVE    Ketones, ur NEGATIVE  NEGATIVE mg/dL    Protein, ur NEGATIVE  NEGATIVE mg/dL    Urobilinogen, UA 0.2  0.0 - 1.0 mg/dL    Nitrite NEGATIVE  NEGATIVE    Leukocytes, UA NEGATIVE  NEGATIVE MICROSCOPIC NOT DONE ON URINES WITH NEGATIVE PROTEIN, BLOOD, LEUKOCYTES, NITRITE, OR GLUCOSE <1000 mg/dL.  CBC WITH DIFFERENTIAL     Status: Abnormal   Collection Time   10/06/11  5:26 PM      Component Value Range Comment   WBC 4.4  4.0 - 10.5 K/uL    RBC 5.08  3.87 - 5.11 MIL/uL    Hemoglobin 15.3 (*) 12.0 - 15.0 g/dL    HCT  45.4  09.8 - 11.9 %    MCV 90.4  78.0 - 100.0 fL    MCH 30.1  26.0 - 34.0 pg    MCHC 33.3  30.0 - 36.0 g/dL    RDW 14.7  82.9 - 56.2 %    Platelets 206  150 - 400 K/uL    Neutrophils Relative 67  43 - 77 %    Neutro Abs 2.9  1.7 - 7.7 K/uL    Lymphocytes Relative 20  12 - 46 %    Lymphs Abs 0.9  0.7 - 4.0 K/uL    Monocytes Relative 11  3 - 12 %    Monocytes Absolute 0.5  0.1 - 1.0 K/uL    Eosinophils Relative 1  0 - 5 %    Eosinophils Absolute 0.0  0.0 - 0.7 K/uL    Basophils Relative 1  0 - 1 %    Basophils Absolute 0.0  0.0 - 0.1 K/uL   COMPREHENSIVE METABOLIC PANEL     Status: Abnormal   Collection Time   10/06/11  5:26 PM      Component Value Range Comment   Sodium 141  135 - 145 mEq/L    Potassium 3.2 (*) 3.5 - 5.1 mEq/L    Chloride 99  96 - 112 mEq/L    CO2 29  19 - 32 mEq/L    Glucose, Bld 95  70 - 99 mg/dL    BUN 10  6 - 23 mg/dL    Creatinine, Ser 1.30  0.50 - 1.10 mg/dL    Calcium 9.9  8.4 - 86.5 mg/dL    Total Protein 7.9  6.0 - 8.3 g/dL    Albumin 3.9  3.5 - 5.2 g/dL    AST 20  0 - 37 U/L    ALT 6  0 - 35 U/L    Alkaline Phosphatase 95  39 - 117 U/L    Total Bilirubin 0.6  0.3 - 1.2 mg/dL    GFR calc non Af Amer 57 (*) >90 mL/min    GFR calc Af Amer 66 (*) >90 mL/min   LIPASE, BLOOD     Status: Normal   Collection Time   10/06/11  5:26 PM      Component Value Range Comment   Lipase 31  11 - 59 U/L   PROTIME-INR  Status: Normal   Collection Time   10/06/11  6:25 PM      Component Value Range Comment   Prothrombin Time 13.0  11.6 - 15.2 seconds    INR 0.96  0.00 - 1.49    Dg Chest 2 View  10/06/2011  *RADIOLOGY REPORT*  Clinical Data: Stroke symptoms with shortness of breath.  CHEST - 2 VIEW  Comparison: Most recent 06/15/2011.  Findings: Cardiomegaly.  COPD with hyperinflation.  No infiltrates or failure.  No effusion or pneumothorax.  Calcified tortuous aorta. Old left-sided rib fractures.  Similar appearance to priors.  IMPRESSION: Cardiomegaly with COPD.   No definite active infiltrates.  Original Report Authenticated By: Elsie Stain, M.D.   Ct Head Wo Contrast  10/06/2011  *RADIOLOGY REPORT*  Clinical Data: Weakness and slurred speech; unable to walk without assistance.  Possible left-sided weakness.  CT HEAD WITHOUT CONTRAST  Technique:  Contiguous axial images were obtained from the base of the skull through the vertex without contrast.  Comparison: Previous study from 07/11/2010.  The  Findings: There are extensive  changes of chronic brain substance loss with  old right frontal and left temporo-occipital infarctions noted on previous study. There has been interval development of a moderately large left frontal suprasylvian cortical and subcortical infarct since the prior exam, although this too appears chronic.  There is an area of hypoattenuation in the right parietal cortex and subcortical white matter (images 14 - 17, series 2) concerning for acute infarction.  No visible acute hemorrhage.  No midline shift or hydrocephalus.  No extra-axial fluid.  Intact calvarium. Clear sinuses and mastoids.  IMPRESSION: Findings concerning for acute right parietal cortical and subcortical infarction without hemorrhage.  Areas of chronic infarction as described.  Original Report Authenticated By: Elsie Stain, M.D.    Review of Systems  Constitutional: Negative for fever and chills.  HENT: Negative for ear pain, tinnitus and ear discharge.   Eyes: Negative for blurred vision and double vision.  Cardiovascular: Negative for chest pain, palpitations, orthopnea and claudication.  Gastrointestinal: Negative for heartburn, nausea, vomiting and abdominal pain.  Genitourinary: Negative for dysuria.  Neurological: Positive for headaches. Negative for dizziness.    Blood pressure 125/98, pulse 57, temperature 97.4 F (36.3 C), temperature source Oral, resp. rate 18, SpO2 96.00%. Physical Exam  Constitutional: She is oriented to person, place, and time.    HENT:  Head: Normocephalic and atraumatic.  Nose: Nose normal.  Mouth/Throat: No oropharyngeal exudate.  Eyes: Conjunctivae are normal. Pupils are equal, round, and reactive to light. Left eye exhibits no discharge. No scleral icterus.  Neck: Normal range of motion. Neck supple. No JVD present. No tracheal deviation present. No thyromegaly present.  Cardiovascular: Normal rate and regular rhythm.   Murmur (Soft systolic murmur noted no S3 gallop) heard. Respiratory:       Decreased breath sound at bases no wheezing  GI: Soft. Bowel sounds are normal. She exhibits no distension. There is no tenderness. There is no rebound.  Musculoskeletal: She exhibits no edema and no tenderness.  Neurological: She is alert and oriented to person, place, and time. No cranial nerve deficit.       Motor 4/5 in left lower extremity and 4+ over 5 in left upper extremity     Assessment/Plan Acute right parietal cortical and subcortical infarct rule out cardioembolic CVA History of CVA in past Nonischemic dilated cardiomyopathy History of non-Q-wave MI secondary to vasospasm secondary to cocaine abuse in past Hypertension COPD Tobacco  abuse Hypercholesteremia History of dementia History of schizophrenia History of mild renal insufficiency in the past History of CVA of breast sign History of polysubstance abuse History of cocaine abuse in the past Plan As per orders Neuro consult OT PT consult Swallowing evaluation Continue home meds Check 2-D echo/duplex ultrasound May need TEE to rule out cardiac source of emboli  Thoren Hosang N 10/06/2011, 7:58 PM

## 2011-10-06 NOTE — ED Notes (Signed)
MD at bedside. Dr. Sharyn Lull at beside.

## 2011-10-06 NOTE — Consult Note (Addendum)
Referring Physician: Deretha Emory    Chief Complaint: Right sided weakness  HPI: [name] is an 66 y.o. female who reports going to bed last evening and being at baseline.  It seems that her baseline is left sided weakness,  Patient is a poor historian.  This morning on awakening was unable to dress herself and had slurred speech.  Was also noted to be unable to ambulate without assistance.  Patient was brought in for evaluation by EMS.  In work up head CT was performed and shows areas of old infarctions and a evidence of possible acute right parietal cortical and subcortical infarctions.    LSN: 10/05/2011 tPA Given: No: Outside time window  Past Medical History  Diagnosis Date  . Weight loss, unintentional   . Trouble swallowing   . Change in voice   . Substance abuse   . Dementia   . Asthma   . Shortness of breath   . Seizures     "    It has been along time "  . CHF (congestive heart failure)   . Headache   . Hypertension   . Arthralgia   . Tremors of nervous system   . Anxiety   . Rib fractures     hx of May 2012  . Heart attack 05/21/10  . Schizophrenia   . Coronary artery disease   . Stroke 07/11/10    hx of R CVA   . COPD (chronic obstructive pulmonary disease)   . Obstructive sleep apnea   . Breast cancer 01/28/11    L breast, inv ductal/in situ, ER/PR -, Her2 -    Past Surgical History  Procedure Date  . Total abdominal hysterectomy age 66    Family History  Problem Relation Age of Onset  . Breast cancer Mother   . Birth defects Mother     breast  . Cancer Mother     breast  . Heart disease Father     heart attack  . Heart attack Father   . Heart attack Brother   . Cancer Brother     throat, lung  . Cancer Paternal Aunt   . Cancer Maternal Grandmother     breast    Social History:  reports that she quit smoking about 3 years ago. Her smoking use included Cigarettes. She has a 50 pack-year smoking history. She has never used smokeless tobacco.  She reports that she drinks alcohol. She reports that she uses illicit drugs.  Allergies:  Allergies  Allergen Reactions  . Codeine Itching    All over the body  . Penicillins Hives and Other (See Comments)    "Whelps" per patient.  . Hydralazine     02/28/11 Family unsure if this is allergy for pt    Medications: Prior to Admission:  Current outpatient prescriptions:albuterol (PROVENTIL) (2.5 MG/3ML) 0.083% nebulizer solution, Take 2.5 mg by nebulization every 6 (six) hours as needed. For wheezing, Disp: , Rfl: ;  albuterol (PROVENTIL) 90 MCG/ACT inhaler, Inhale 2 puffs into the lungs every 6 (six) hours as needed. For wheezing , Disp: , Rfl: ;  aspirin 81 MG tablet, Take 81 mg by mouth daily.  , Disp: , Rfl:  budesonide-formoterol (SYMBICORT) 80-4.5 MCG/ACT inhaler, Inhale 2 puffs into the lungs 2 (two) times daily., Disp: 1 Inhaler, Rfl: 1;  carvedilol (COREG) 3.125 MG tablet, Take 3.125 mg by mouth 2 (two) times daily with a meal.  , Disp: , Rfl: ;  clopidogrel (PLAVIX) 75 MG  tablet, Take 75 mg by mouth daily.  , Disp: , Rfl: ;  docusate sodium (COLACE) 100 MG capsule, Take 100 mg by mouth 2 (two) times daily.  , Disp: , Rfl:  furosemide (LASIX) 40 MG tablet, Take 40 mg by mouth daily.  , Disp: , Rfl: ;  isosorbide mononitrate (IMDUR) 30 MG 24 hr tablet, Take 30 mg by mouth daily.  , Disp: , Rfl: ;  letrozole (FEMARA) 2.5 MG tablet, Take 2.5 mg by mouth daily., Disp: , Rfl: ;  nitroGLYCERIN (NITROSTAT) 0.4 MG SL tablet, Place 0.4 mg under the tongue every 5 (five) minutes as needed.  , Disp: , Rfl:  OLANZapine (ZYPREXA) 2.5 MG tablet, Take 2.5 mg by mouth at bedtime.  , Disp: , Rfl: ;  olmesartan (BENICAR) 5 MG tablet, Take 1 tablet (5 mg total) by mouth daily., Disp: 30 tablet, Rfl: 1;  omeprazole (PRILOSEC) 20 MG capsule, Take 20 mg by mouth daily.  , Disp: , Rfl: ;  QUEtiapine (SEROQUEL) 50 MG tablet, Take 50 mg by mouth at bedtime.  , Disp: , Rfl: ;  simvastatin (ZOCOR) 10 MG tablet, Take 20  mg by mouth at bedtime. , Disp: , Rfl:  tiotropium (SPIRIVA) 18 MCG inhalation capsule, Place 18 mcg into inhaler and inhale daily.  , Disp: , Rfl: ;  traZODone (DESYREL) 50 MG tablet, Take 50 mg by mouth at bedtime.  , Disp: , Rfl: ;  zolpidem (AMBIEN) 5 MG tablet, Take 5 mg by mouth at bedtime as needed., Disp: , Rfl:   ROS: History obtained from the patient  General ROS: negative for - chills, fatigue, fever, night sweats, weight gain or weight loss Psychological ROS: negative for - behavioral disorder, hallucinations, memory difficulties, mood swings or suicidal ideation Ophthalmic ROS: negative for - blurry vision, double vision, eye pain or loss of vision ENT ROS: negative for - epistaxis, nasal discharge, oral lesions, sore throat, tinnitus or vertigo Allergy and Immunology ROS: negative for - hives or itchy/watery eyes Hematological and Lymphatic ROS: negative for - bleeding problems, bruising or swollen lymph nodes Endocrine ROS: negative for - galactorrhea, hair pattern changes, polydipsia/polyuria or temperature intolerance Respiratory ROS: negative for - cough, hemoptysis, shortness of breath or wheezing Cardiovascular ROS: negative for - chest pain, dyspnea on exertion, edema or irregular heartbeat Gastrointestinal ROS: negative for - abdominal pain, diarrhea, hematemesis, nausea/vomiting or stool incontinence Genito-Urinary ROS: negative for - dysuria, hematuria, incontinence or urinary frequency/urgency Musculoskeletal ROS: left arm pain Neurological ROS: as noted in HPI Dermatological ROS: negative for rash and skin lesion changes  Physical Examination: Blood pressure 125/98, pulse 57, temperature 97.4 F (36.3 C), temperature source Oral, resp. rate 18, SpO2 96.00%.  Neurologic Examination: Mental Status: Alert.  Confused.  Speech not fluent with multiple repetitions and word finding difficulties.  Able to follow commands without difficulty. Cranial Nerves: II: Discs  flat bilaterally; pupils equal, round, reactive to light and accommodation; Visual fields difficult to evaluate.  Although able to recognize at least some portion of my hand in each visual field there was no distribution to suggest a field cut.  Was then unable to appreciate that she had any objects on the bedside table in front of her.   III,IV, VI: ptosis not present, extra-ocular motions intact bilaterally V,VII: smile symmetric, facial light touch sensation normal bilaterally VIII: hearing normal bilaterally IX,X: gag reflex present XI: trapezius strength/neck flexion strength normal bilaterally XII: tongue deviation to the right  Motor: Right : Upper extremity  5/5    Left:     Upper extremity   4/5  Lower extremity   4/5     Lower extremity   0/5 Increased tone on the left.  Evidence of hand intrinsic atrophy bialterally Sensory: Pinprick and light touch intact throughout, bilaterally Deep Tendon Reflexes: 3+ and symmetric throughout Plantars: Right: upgoing   Left: upgoing Cerebellar: normal finger-to-nose and normal heel-to-shin test on the right  Laboratory Studies:  Basic Metabolic Panel:  Lab 10/06/11 2440  NA 141  K 3.2*  CL 99  CO2 29  GLUCOSE 95  BUN 10  CREATININE 1.01  CALCIUM 9.9  MG --  PHOS --    Liver Function Tests:  Lab 10/06/11 1726  AST 20  ALT 6  ALKPHOS 95  BILITOT 0.6  PROT 7.9  ALBUMIN 3.9    Lab 10/06/11 1726  LIPASE 31  AMYLASE --   No results found for this basename: AMMONIA:3 in the last 168 hours  CBC:  Lab 10/06/11 1726  WBC 4.4  NEUTROABS 2.9  HGB 15.3*  HCT 45.9  MCV 90.4  PLT 206    Cardiac Enzymes: No results found for this basename: CKTOTAL:5,CKMB:5,CKMBINDEX:5,TROPONINI:5 in the last 168 hours  BNP: No components found with this basename: POCBNP:5  CBG:  Lab 10/06/11 1723  GLUCAP 93    Microbiology: Results for orders placed during the hospital encounter of 02/16/11  CULTURE, BLOOD (ROUTINE X 2)      Status: Normal   Collection Time   02/16/11 11:50 PM      Component Value Range Status Comment   Specimen Description BLOOD RIGHT ARM   Final    Special Requests BOTTLES DRAWN AEROBIC ONLY 1CC   Final    Culture  Setup Time [id number]   Final    Culture NO GROWTH 5 DAYS   Final    Report Status 02/23/2011 FINAL   Final   CULTURE, BLOOD (ROUTINE X 2)     Status: Normal   Collection Time   02/17/11 12:10 AM      Component Value Range Status Comment   Specimen Description BLOOD LEFT HAND   Final    Special Requests     Final    Value: BOTTLES DRAWN AEROBIC AND ANAEROBIC 3CC BLUE, 1CC RED   Culture  Setup Time [id number]   Final    Culture NO GROWTH 5 DAYS   Final    Report Status 02/23/2011 FINAL   Final     Coagulation Studies:  Basename 10/06/11 1825  LABPROT 13.0  INR 0.96    Urinalysis:  Lab 10/06/11 1725  COLORURINE YELLOW  LABSPEC 1.008  PHURINE 7.0  GLUCOSEU NEGATIVE  HGBUR NEGATIVE  BILIRUBINUR NEGATIVE  KETONESUR NEGATIVE  PROTEINUR NEGATIVE  UROBILINOGEN 0.2  NITRITE NEGATIVE  LEUKOCYTESUR NEGATIVE    Lipid Panel:    Component Value Date/Time   CHOL 219* 04/09/2011 0700   TRIG 32 04/09/2011 0700   HDL 99 04/09/2011 0700   CHOLHDL 2.2 04/09/2011 0700   VLDL 6 04/09/2011 0700   LDLCALC 114* 04/09/2011 0700    HgbA1C:  Lab Results  Component Value Date   HGBA1C  Value: 6.0 (NOTE)  According to the ADA Clinical Practice Recommendations for 2011, when HbA1c is used as a screening test:   >=6.5%   Diagnostic of Diabetes Mellitus           (if abnormal result  is confirmed)  5.7-6.4%   Increased risk of developing Diabetes Mellitus  References:Diagnosis and Classification of Diabetes Mellitus,Diabetes Care,2011,34(Suppl 1):S62-S69 and Standards of Medical Care in         Diabetes - 2011,Diabetes Care,2011,34  (Suppl 1):S11-S61.* 06/18/2010    Urine Drug Screen:     Component Value Date/Time     LABOPIA NONE DETECTED 06/15/2011 1400   LABOPIA NEGATIVE 04/01/2009 1007   COCAINSCRNUR NONE DETECTED 06/15/2011 1400   COCAINSCRNUR  Value: POSITIVE (NOTE) Result repeated and verified. Sent for confirmatory testing* 04/01/2009 1007   LABBENZ NONE DETECTED 06/15/2011 1400   LABBENZ NEGATIVE 04/01/2009 1007   AMPHETMU NONE DETECTED 06/15/2011 1400   AMPHETMU NEGATIVE 04/01/2009 1007   THCU NONE DETECTED 06/15/2011 1400   LABBARB NONE DETECTED 06/15/2011 1400    Alcohol Level: No results found for this basename: ETH:2 in the last 168 hours  Imaging: Dg Chest 2 View  10/06/2011  *RADIOLOGY REPORT*  Clinical Data: Stroke symptoms with shortness of breath.  CHEST - 2 VIEW  Comparison: Most recent 06/15/2011.  Findings: Cardiomegaly.  COPD with hyperinflation.  No infiltrates or failure.  No effusion or pneumothorax.  Calcified tortuous aorta. Old left-sided rib fractures.  Similar appearance to priors.  IMPRESSION: Cardiomegaly with COPD.  No definite active infiltrates.  Original Report Authenticated By: Elsie Stain, M.D.   Ct Head Wo Contrast  10/06/2011  *RADIOLOGY REPORT*  Clinical Data: Weakness and slurred speech; unable to walk without assistance.  Possible left-sided weakness.  CT HEAD WITHOUT CONTRAST  Technique:  Contiguous axial images were obtained from the base of the skull through the vertex without contrast.  Comparison: Previous study from 07/11/2010.  The  Findings: There are extensive  changes of chronic brain substance loss with  old right frontal and left temporo-occipital infarctions noted on previous study. There has been interval development of a moderately large left frontal suprasylvian cortical and subcortical infarct since the prior exam, although this too appears chronic.  There is an area of hypoattenuation in the right parietal cortex and subcortical white matter (images 14 - 17, series 2) concerning for acute infarction.  No visible acute hemorrhage.  No midline shift or  hydrocephalus.  No extra-axial fluid.  Intact calvarium. Clear sinuses and mastoids.  IMPRESSION: Findings concerning for acute right parietal cortical and subcortical infarction without hemorrhage.  Areas of chronic infarction as described.  Original Report Authenticated By: Elsie Stain, M.D.    Assessment: 66 y.o. female presenting with new onset right sided weakness.  Speech is slurred as well.  CT shows possible right parietal acute infarct. History of infarcts in the past.  On Plavix at home. No history of atrial fibrillation.  CT findings not consistent with what is felt to be acute on exam.  Further work up indicated.    Stroke Risk Factors - hypertension and CAD  Plan: 1. HgbA1c, fasting lipid panel 2. MRI, MRA  of the brain without contrast 3. PT consult, OT consult, Speech consult 4. Echocardiogram 5. Carotid dopplers 6. Prophylactic therapy-Continue Plavix.  May add ASA at 81mg  daily as well.   7. Risk factor modification.  Agree with liberal blood pressure control at this time but would maintain on home antihypertensives.   8. Telemetry monitoring  9. Frequent neuro checks    Thana Farr, MD [facility] [phone] 10/06/2011, 8:19 PM

## 2011-10-06 NOTE — ED Notes (Addendum)
Patient was normal last pm- woke up this am unable to dress self, walk without assistance.  Patient normally takes care of self and walks with walker.  Left -sided weakness noted by EMS- speech slurred.  Hx of 2 previous stroke with unknown deficits.

## 2011-10-06 NOTE — ED Notes (Signed)
Pt placed on bedpan.  Voiding without difficulty.

## 2011-10-06 NOTE — ED Provider Notes (Signed)
History     CSN: [id number]  Arrival date & time 10/06/11  1642   First MD Initiated Contact with Patient 10/06/11 1653      Chief Complaint  Patient presents with  . Stroke Symptoms    (Consider location/radiation/quality/duration/timing/severity/associated sxs/prior treatment) Patient is a 66 y.o. female presenting with altered mental status. The history is provided by the patient and the EMS personnel.  Altered Mental Status This is a new problem. The current episode started today. The problem occurs intermittently. The problem has been unchanged. Associated symptoms include abdominal pain, nausea, urinary symptoms (frequency and urgency) and vomiting (Yesterday). Pertinent negatives include no change in bowel habit, chest pain, chills, coughing, fever, headaches, numbness, sore throat or visual change. Nothing aggravates the symptoms. She has tried nothing for the symptoms.  Pt last seen normal yesterday.  This morning she was more confused and tried to put her clothes on backwards.  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  Past Medical History  Diagnosis Date  . Weight loss, unintentional   . Trouble swallowing   . Change in voice   . Substance abuse   . Dementia   . Asthma   . Shortness of breath   . Seizures     "    It has been along time "  . CHF (congestive heart failure)   . Headache   . Hypertension   . Arthralgia   . Tremors of nervous system   . Anxiety   . Rib fractures     hx of May 2012  . Heart attack 05/21/10  . Schizophrenia   . Coronary artery disease   . Stroke 07/11/10    hx of R CVA   . COPD (chronic obstructive pulmonary disease)   . Obstructive sleep apnea   . Breast cancer 01/28/11    L breast, inv ductal/in situ, ER/PR -, Her2 -    Past Surgical History  Procedure Date  . Total abdominal hysterectomy age 12    Family History  Problem Relation Age of Onset  . Breast cancer Mother   . Birth defects Mother     breast  .  Cancer Mother     breast  . Heart disease Father     heart attack  . Heart attack Father   . Heart attack Brother   . Cancer Brother     throat, lung  . Cancer Paternal Aunt   . Cancer Maternal Grandmother     breast     History  Substance Use Topics  . Smoking status: Former Smoker -- 1.0 packs/day for 50 years    Types: Cigarettes    Quit date: 02/22/2008  . Smokeless tobacco: Never Used   Comment: started smoking at age 66  . Alcohol Use: Yes     social use    OB History    Grav Para Term Preterm Abortions TAB SAB Ect Mult Living                  Review of Systems  Constitutional: Negative for fever and chills.  HENT: Negative.  Negative for sore throat.   Eyes: Negative.   Respiratory: Negative for cough and shortness of breath.   Cardiovascular: Negative for chest pain, palpitations and leg swelling.  Gastrointestinal: Positive for nausea, vomiting (Yesterday) and abdominal pain. Negative for diarrhea, constipation and change in bowel habit.  Genitourinary: Positive for urgency and frequency. Negative for dysuria.  Musculoskeletal: Negative.   Skin:  Negative.   Neurological: Negative for dizziness, seizures, facial asymmetry, numbness and headaches.  Psychiatric/Behavioral: Positive for confusion and altered mental status.  All other systems reviewed and are negative.    Allergies  Codeine; Penicillins; and Hydralazine  Home Medications   Current Outpatient Rx  Name Route Sig Dispense Refill  . ALBUTEROL SULFATE (2.5 MG/3ML) 0.083% IN NEBU Nebulization Take 2.5 mg by nebulization every 6 (six) hours as needed. For wheezing    . ALBUTEROL 90 MCG/ACT IN AERS Inhalation Inhale 2 puffs into the lungs every 6 (six) hours as needed. For wheezing     . ASPIRIN 81 MG PO TABS Oral Take 81 mg by mouth daily.      . BUDESONIDE-FORMOTEROL FUMARATE 80-4.5 MCG/ACT IN AERO Inhalation Inhale 2 puffs into the lungs 2 (two) times daily. 1 Inhaler 1  . CARVEDILOL 3.125 MG  PO TABS Oral Take 3.125 mg by mouth 2 (two) times daily with a meal.      . CLOPIDOGREL BISULFATE 75 MG PO TABS Oral Take 75 mg by mouth daily.      Marland Kitchen DOCUSATE SODIUM 100 MG PO CAPS Oral Take 100 mg by mouth 2 (two) times daily.      . FUROSEMIDE 40 MG PO TABS Oral Take 40 mg by mouth daily.      . ISOSORBIDE MONONITRATE ER 30 MG PO TB24 Oral Take 30 mg by mouth daily.      Marland Kitchen LETROZOLE 2.5 MG PO TABS Oral Take 2.5 mg by mouth daily.    Marland Kitchen NITROGLYCERIN 0.4 MG SL SUBL Sublingual Place 0.4 mg under the tongue every 5 (five) minutes as needed.      Marland Kitchen OLANZAPINE 2.5 MG PO TABS Oral Take 2.5 mg by mouth at bedtime.      Marland Kitchen OLMESARTAN MEDOXOMIL 5 MG PO TABS Oral Take 1 tablet (5 mg total) by mouth daily. 30 tablet 1  . OMEPRAZOLE 20 MG PO CPDR Oral Take 20 mg by mouth daily.      . QUETIAPINE FUMARATE 50 MG PO TABS Oral Take 50 mg by mouth at bedtime.      Marland Kitchen SIMVASTATIN 10 MG PO TABS Oral Take 20 mg by mouth at bedtime.     Marland Kitchen TIOTROPIUM BROMIDE MONOHYDRATE 18 MCG IN CAPS Inhalation Place 18 mcg into inhaler and inhale daily.      . TRAZODONE HCL 50 MG PO TABS Oral Take 50 mg by mouth at bedtime.      Marland Kitchen ZOLPIDEM TARTRATE 5 MG PO TABS Oral Take 5 mg by mouth at bedtime as needed.      BP 125/98  Pulse 57  Temp 97.4 F (36.3 C) (Oral)  Resp 18  SpO2 96%  Physical Exam  Nursing note and vitals reviewed. Constitutional: She appears well-developed and well-nourished. No distress.  HENT:  Head: Normocephalic and atraumatic.  Eyes: Conjunctivae are normal.  Neck: Neck supple.  Cardiovascular: Normal rate, regular rhythm, normal heart sounds and intact distal pulses.   Pulmonary/Chest: Effort normal and breath sounds normal. She has no wheezes. She has no rales.  Abdominal: Soft. She exhibits no distension. There is generalized tenderness. There is no CVA tenderness.  Musculoskeletal: Normal range of motion.  Neurological: She is alert. No cranial nerve deficit or sensory deficit.       Strength  4/5 throughout left upper and lower extremities.    Skin: Skin is warm and dry.    ED Course  Procedures (including critical care time)  Labs  Reviewed  CBC WITH DIFFERENTIAL - Abnormal; Notable for the following:    Hemoglobin 15.3 (*)     All other components within normal limits  COMPREHENSIVE METABOLIC PANEL - Abnormal; Notable for the following:    Potassium 3.2 (*)     GFR calc non Af Amer 57 (*)     GFR calc Af Amer 66 (*)     All other components within normal limits  LIPASE, BLOOD  URINALYSIS, ROUTINE W REFLEX MICROSCOPIC  GLUCOSE, CAPILLARY  PROTIME-INR   Dg Chest 2 View  10/06/2011  *RADIOLOGY REPORT*  Clinical Data: Stroke symptoms with shortness of breath.  CHEST - 2 VIEW  Comparison: Most recent 06/15/2011.  Findings: Cardiomegaly.  COPD with hyperinflation.  No infiltrates or failure.  No effusion or pneumothorax.  Calcified tortuous aorta. Old left-sided rib fractures.  Similar appearance to priors.  IMPRESSION: Cardiomegaly with COPD.  No definite active infiltrates.  Original Report Authenticated By: Elsie Stain, M.D.   Ct Head Wo Contrast  10/06/2011  *RADIOLOGY REPORT*  Clinical Data: Weakness and slurred speech; unable to walk without assistance.  Possible left-sided weakness.  CT HEAD WITHOUT CONTRAST  Technique:  Contiguous axial images were obtained from the base of the skull through the vertex without contrast.  Comparison: Previous study from 07/11/2010.  The  Findings: There are extensive  changes of chronic brain substance loss with  old right frontal and left temporo-occipital infarctions noted on previous study. There has been interval development of a moderately large left frontal suprasylvian cortical and subcortical infarct since the prior exam, although this too appears chronic.  There is an area of hypoattenuation in the right parietal cortex and subcortical white matter (images 14 - 17, series 2) concerning for acute infarction.  No visible acute  hemorrhage.  No midline shift or hydrocephalus.  No extra-axial fluid.  Intact calvarium. Clear sinuses and mastoids.  IMPRESSION: Findings concerning for acute right parietal cortical and subcortical infarction without hemorrhage.  Areas of chronic infarction as described.  Original Report Authenticated By: Elsie Stain, M.D.     1. CVA (cerebral vascular accident)       MDM  66 yo female with extensive PMHx including prior CVA with residual left sided weakness, CHF, CAD, seizures who presents for increased confusion.  Pt last seen normal yesterday.  Per family she had increasing confusion today and was unable to dress herself.  Pt reports abdominal pain and vomiting yesterday.  Also has had some urinary urgency and frequency w/o dysuria.  No fever, chest pain, shortness of breath, diarrhea, or constipation.  AF, VSS, NAD.  Pt alert at time of exam.  Strength 4/5 throughout left upper and lower extremities consistent with old deficits.  Otherwise normal neurologic exam.  Pt with generalized abdominal tenderness on exam.  Concern for infectious cause for pt's altered mental status.  Will get labs, CXR, and CT head.  EKG with LBBB unchanged from EKG 06/15/11.  CBC, CMP, lipase wnl.  UA negative for UTI.  CT concerning for acute right parietal cortical and subcortal infarctions.  Will consult neurology for further evaluation.  Pt discussed with Neurology who will see pt.  Pt will need admission to Hospitalist service as she is not a tPA candidate.  Pt admitted under the care of Dr. Sharyn Lull.       Cherre Robins, MD 10/06/11 2041

## 2011-10-07 ENCOUNTER — Inpatient Hospital Stay (HOSPITAL_COMMUNITY): Payer: Medicare Other

## 2011-10-07 LAB — LIPID PANEL
Cholesterol: 213 mg/dL — ABNORMAL HIGH (ref 0–200)
LDL Cholesterol: 98 mg/dL (ref 0–99)
Triglycerides: 73 mg/dL (ref <150)

## 2011-10-07 LAB — HEMOGLOBIN A1C: Hgb A1c MFr Bld: 5.9 % — ABNORMAL HIGH (ref <5.7)

## 2011-10-07 MED ORDER — ASPIRIN 81 MG PO CHEW
81.0000 mg | CHEWABLE_TABLET | Freq: Every day | ORAL | Status: DC
  Administered 2011-10-08 – 2011-10-11 (×4): 81 mg via ORAL
  Filled 2011-10-07 (×4): qty 1

## 2011-10-07 MED ORDER — LEVALBUTEROL HCL 1.25 MG/0.5ML IN NEBU
1.2500 mg | INHALATION_SOLUTION | Freq: Four times a day (QID) | RESPIRATORY_TRACT | Status: DC | PRN
  Filled 2011-10-07: qty 0.5

## 2011-10-07 NOTE — Progress Notes (Signed)
  Echocardiogram 2D Echocardiogram has been performed.  Georgian Co 10/07/2011, 9:17 AM

## 2011-10-07 NOTE — Evaluation (Signed)
Speech Language Pathology Evaluation Patient Details Name: [name] MRN: [id number] DOB: 1945/08/21 Today's Date: 10/07/2011 Time: 4098-1191 SLP Time Calculation (min): 24 min  Problem List:  Patient Active Problem List  Diagnosis  . HYPERLIPIDEMIA  . SCHIZOPHRENIA  . ANXIETY  . COCAINE ABUSE  . HYPERTENSION, BENIGN ESSENTIAL  . MYOCARDIAL INFARCTION  . CAD  . CHF  . Cerebral embolism with cerebral infarction  . Aneurysm of other specified artery  . ASTHMA  . COPD  . CONSTIPATION  . CHOLELITHIASIS  . RENAL FAILURE, ACUTE  . LIVER FUNCTION TESTS, ABNORMAL, HX OF  . Trouble swallowing  . Breast cancer, stage 2  . Hypertension  . Breast cancer  . Arthralgia  . Chest pain at rest   Past Medical History:  Past Medical History  Diagnosis Date  . Weight loss, unintentional   . Trouble swallowing   . Change in voice   . Substance abuse   . Dementia   . Asthma   . Shortness of breath   . Seizures     "    It has been along time "  . CHF (congestive heart failure)   . Headache   . Hypertension   . Arthralgia   . Tremors of nervous system   . Anxiety   . Rib fractures     hx of May 2012  . Heart attack 05/21/10  . Schizophrenia   . Coronary artery disease   . Stroke 07/11/10    hx of R CVA   . COPD (chronic obstructive pulmonary disease)   . Obstructive sleep apnea   . Breast cancer 01/28/11    L breast, inv ductal/in situ, ER/PR -, Her2 -   Past Surgical History:  Past Surgical History  Procedure Date  . Total abdominal hysterectomy age 7   HPI:  [name] is an 65 y.o. female who reports going to bed last evening and being at baseline.  It seems that her baseline is left sided weakness,  Patient is a poor historian.  This morning on awakening was unable to dress herself and had slurred speech.  Was also noted to be unable to ambulate without assistance.  Patient was brought in for evaluation by EMS.  In work up head CT was performed and shows  areas of old infarctions and a evidence of possible acute right parietal cortical and subcortical infarctions.     Assessment / Plan / Recommendation Clinical Impression  Patient presents with cognitive and visual spacial deficits s/p acute CVA effecting attention, awareness, memory, problem solving, and. Additionally, ? left sided neglect vs visual field cut vs general visual spacial deficits given right sided parietal CVA. Unclear what baseline is at this time. Patient will benefit from acute SLP f/u to address these areas, increasing safety and efficiency with ADLs.    SLP Assessment  Patient needs continued Speech Lanaguage Pathology Services    Follow Up Recommendations  Inpatient Rehab    Frequency and Duration min 2x/week  2 weeks   Pertinent Vitals/Pain n/a   SLP Goals  SLP Goals Potential to Achieve Goals: Good Potential Considerations: Other (comment) (? prior level of function) Progress/Goals/Alternative treatment plan discussed with pt/caregiver and they: Agree SLP Goal #1: Patient will answer orientation questions x 3 with min questioning cues. SLP Goal #1 - Progress: Not met SLP Goal #2: Patient will sustain attention to basic functional ADL with min clincian verbal redirection SLP Goal #2 - Progress: Not met SLP Goal #3:  Patient will attend to left visual field during a functional ADL with moderate verbal and visual cues.  SLP Goal #3 - Progress: Not met  SLP Evaluation Prior Functioning  Cognitive/Linguistic Baseline: Information not available Lives With: Other (Comment) (? neice vs cousin, patient provided inconsistent history)   Cognition  Overall Cognitive Status: Impaired Arousal/Alertness: Awake/alert Orientation Level: Oriented to person;Disoriented to place;Disoriented to time;Disoriented to situation Attention: Focused;Sustained Focused Attention: Appears intact Sustained Attention: Impaired Sustained Attention Impairment: Verbal basic;Functional  basic Memory: Impaired Memory Impairment: Storage deficit;Retrieval deficit;Decreased recall of new information;Decreased short term memory;Decreased long term memory Decreased Short Term Memory: Verbal basic;Functional basic Awareness: Impaired Awareness Impairment: Intellectual impairment (minimally improved by end of evaluation) Problem Solving: Impaired Problem Solving Impairment: Verbal basic;Functional basic Safety/Judgment: Impaired    Comprehension  Auditory Comprehension Overall Auditory Comprehension: Impaired Yes/No Questions: Impaired Basic Biographical Questions: 76-100% accurate Basic Immediate Environment Questions: 75-100% accurate Complex Questions: 50-74% accurate Commands: Impaired Two Step Basic Commands: 75-100% accurate Multistep Basic Commands: 75-100% accurate Conversation: Simple Interfering Components: Attention EffectiveTechniques: Repetition Visual Recognition/Discrimination Discrimination:  (impaired, ? secondary to visual impairements) Reading Comprehension Reading Status: Not tested    Expression Expression Primary Mode of Expression: Verbal Verbal Expression Overall Verbal Expression: Appears within functional limits for tasks assessed Other Verbal Expression Comments: Intermittent word finding difficulty, primarily during confrontation naming tasks. Suspect related to visual deficits rather than an aphasia as responsive naming WFL.  Written Expression Written Expression: Not tested   Oral / Motor Oral Motor/Sensory Function Overall Oral Motor/Sensory Function: Appears within functional limits for tasks assessed Motor Speech Overall Motor Speech: Impaired (? impaired at baseline, mild dysarthria)   GO   Bailey Lango MA, CCC-SLP [phone]   [name] 10/07/2011, 2:16 PM

## 2011-10-07 NOTE — Progress Notes (Signed)
VASCULAR LAB PRELIMINARY  PRELIMINARY  PRELIMINARY  PRELIMINARY  Carotid duplex completed.    Preliminary report:  Technically difficult due to constant movement and respiratory interference. No obvious evidence of significant ICA stenosis. Vertebral artery flow is antegrade.  Reilley Latorre, RVS 10/07/2011, 11:59 AM

## 2011-10-07 NOTE — Progress Notes (Signed)
PT Cancellation Note  Evaluation cancelled this a.m. due to medical issues with patient which prohibited therapy. Spoke with RN and in addition to low BP, pt has been very lethargic and not following commands. Will attempt to see in p.m. If appropriate.  [name] 10/07/2011, 11:50 AM Pager [phone]

## 2011-10-07 NOTE — Progress Notes (Signed)
Informed the attending physician about hypotension (BP- 95/60 mm hg, HR- 70). Pt is asymptomatic, holding all antihypertensive medication and Lasix, now. Will keep close monitoring.

## 2011-10-07 NOTE — Evaluation (Signed)
Physical Therapy Evaluation Patient Details Name: [name] MRN: [id number] DOB: [birth year] Today's Date: 10/07/2011 Time: 1308-6578 PT Time Calculation (min): 31 min  PT Assessment / Plan / Recommendation Clinical Impression  Pt adm with incr Lt sided weakness (prior CVA) with acute Rt parietal CVA. Pt with h/o dementia, so unclear if prior status and home environment information she provided is correct. Per pt, her niece can be with her 24/7--if so, she should be able to d/c home with her niece.Will follow    PT Assessment  Patient needs continued PT services    Follow Up Recommendations  Home health PT;Supervision/Assistance - 24 hour    Barriers to Discharge Other (comment) (unknown degree of caregiver support)      Equipment Recommendations  None recommended by PT;Other (comment) (if pt's information re: DME is correct)    Recommendations for Other Services OT consult   Frequency Min 4X/week    Precautions / Restrictions Precautions Precautions: Fall Restrictions Other Position/Activity Restrictions: Lt inattention vs Lt visual field cut   Pertinent Vitals/Pain BP supine 100/69 HR 68 BP sitting 112/77  HR 71 BP stand 115/75  HR 70      Mobility  Bed Mobility Bed Mobility: Left Sidelying to Sit;Sitting - Scoot to Edge of Bed;Sit to Supine Left Sidelying to Sit: 4: Min assist;HOB flat;With rails Sitting - Scoot to Edge of Bed: 5: Supervision Sit to Supine: 5: Supervision;HOB flat Details for Bed Mobility Assistance: pt required tactile cues to initiate moving towards sitting EOB and min assist to move bil LEs over EOB Transfers Transfers: Sit to Stand;Stand to Sit Sit to Stand: 4: Min assist;With upper extremity assist;From bed Stand to Sit: 4: Min guard;With upper extremity assist;To bed Details for Transfer Assistance: Pt slightly impulsive; min assist to maintain balance with sit to stand Ambulation/Gait Ambulation/Gait Assistance: 4: Min  assist Ambulation Distance (Feet): 15 Feet Assistive device: Rolling walker Ambulation/Gait Assistance Details: Pt walking on toes of Lt foot (able to put foot flat with cues, yet complained of soreness); gait appears antalgic Gait Pattern: Step-to pattern;Decreased step length - left;Left foot flat;Right foot flat Modified Rankin (Stroke Patients Only) Pre-Morbid Rankin Score: Slight disability Modified Rankin: Moderately severe disability    Exercises     PT Diagnosis: Hemiplegia non-dominant side  PT Problem List: Decreased strength;Decreased balance;Decreased mobility;Decreased cognition;Decreased knowledge of use of DME;Decreased safety awareness;Impaired sensation PT Treatment Interventions: DME instruction;Gait training;Stair training;Functional mobility training;Therapeutic activities;Balance training;Neuromuscular re-education;Cognitive remediation;Patient/family education   PT Goals Acute Rehab PT Goals PT Goal Formulation: With patient Time For Goal Achievement: 10/14/11 Potential to Achieve Goals: Good Pt will go Supine/Side to Sit: with modified independence;with HOB 0 degrees PT Goal: Supine/Side to Sit - Progress: Goal set today Pt will go Sit to Supine/Side: with modified independence;with HOB 0 degrees PT Goal: Sit to Supine/Side - Progress: Goal set today Pt will go Sit to Stand: with supervision;with upper extremity assist PT Goal: Sit to Stand - Progress: Goal set today Pt will Ambulate: 51 - 150 feet;with supervision;with least restrictive assistive device PT Goal: Ambulate - Progress: Goal set today Pt will Go Up / Down Stairs: 1-2 stairs;with min assist;with least restrictive assistive device PT Goal: Up/Down Stairs - Progress: Goal set today Additional Goals Additional Goal #1: Pt will attend to Lt side and avoid obstacles while walking to reduce risk of trip/fall. PT Goal: Additional Goal #1 - Progress: Goal set today  Visit Information  Last PT Received  On: 10/07/11 Assistance Needed: +1  Subjective Data  Subjective: "I'm so hungry. do you have any pop?" Patient Stated Goal: agrees wants to be able to walk without falling   Prior Functioning  Home Living Lives With: Other (Comment) (niece) Available Help at Discharge: Family;Available 24 hours/day Type of Home: Apartment Home Access: Stairs to enter Entrance Stairs-Number of Steps: 1 Home Layout: One level Bathroom Shower/Tub: Tub/shower unit Home Adaptive Equipment: Walker - rolling;Straight cane;Shower chair with back;Bedside commode/3-in-1 (BSC over toilet) Additional Comments: history per pt ?reliability Prior Function Level of Independence: Independent (uses cane or walker outside at times) Able to Take Stairs?: Yes (only with help) Driving: No Communication Communication: No difficulties Dominant Hand: Right    Cognition  Overall Cognitive Status: Impaired Area of Impairment: Memory;Awareness of deficits Arousal/Alertness: Awake/alert Orientation Level: Disoriented to;Place;Time;Situation (when given frist syllable of answer, pt can recall) Behavior During Session: [facility] for tasks performed Memory Deficits: answers fairly consistent when providing history    Extremity/Trunk Assessment Right Lower Extremity Assessment RLE ROM/Strength/Tone: WFL for tasks assessed RLE Sensation: WFL - Light Touch (grossly WFL-difficult to assess due to decr cognition) RLE Coordination: WFL - gross/fine motor Left Lower Extremity Assessment LLE ROM/Strength/Tone: Deficits LLE ROM/Strength/Tone Deficits: AAROM WFL;toes with incr extensor tone; pt able to activate hip flexors, knee flexors, knee exensors against gravity (all grossly 2+-3-/5) LLE Sensation: Deficits LLE Sensation Deficits: able to detect light touch, yet reports it does not feel like the Rt leg   Balance Balance Balance Assessed: Yes Static Sitting Balance Static Sitting - Balance Support: No upper extremity  supported;Feet supported Static Sitting - Level of Assistance: 5: Stand by assistance (for safety due to decr cognition) Static Standing Balance Static Standing - Balance Support: Bilateral upper extremity supported Static Standing - Level of Assistance: 5: Stand by assistance  End of Session PT - End of Session Equipment Utilized During Treatment: Gait belt Activity Tolerance: Patient tolerated treatment well Patient left: in bed;with bed alarm set;with call bell/phone within reach Nurse Communication: Mobility status (discussed with nurse tech)  GP     Elna Radovich 10/07/2011, 3:41 PM  Pager [phone]

## 2011-10-07 NOTE — Progress Notes (Signed)
Stroke Team Progress Note  HISTORY [name] is an 66 y.o. female who reports going to bed last evening and being at baseline. It seems that her baseline is left sided weakness, Patient is a poor historian. This morning on awakening was unable to dress herself and had slurred speech. Was also noted to be unable to ambulate without assistance. Patient was brought in for evaluation by EMS. In work up head CT was performed and shows areas of old infarctions and a evidence of possible acute right parietal cortical and subcortical infarctions. Patient was not a TPA candidate secondary to delay in arrival. She was admitted for further evaluation and treatment.  SUBJECTIVE No family is at the bedside.  Overall she feels her condition is stable. She reports her left hand is weak.  OBJECTIVE Most recent Vital Signs: Filed Vitals:   10/07/11 0300 10/07/11 0500 10/07/11 0645 10/07/11 0958  BP: 100/65  93/73 94/59  Pulse: 55  61 63  Temp: 98.5 F (36.9 C)   98.4 F (36.9 C)  TempSrc: Oral Oral    Resp: 20  18 16   Height:      Weight:      SpO2: 100%  96% 94%   CBG (last 3)   Basename 10/06/11 1723  GLUCAP 93   Intake/Output from previous day: 08/15 0701 - 08/16 0700 In: 155 [I.V.:155] Out: -   IV Fluid Intake:     . sodium chloride 20 mL/hr (10/06/11 2245)    MEDICATIONS    . aspirin  300 mg Rectal Daily   Or  . aspirin  325 mg Oral Daily  . budesonide-formoterol  2 puff Inhalation BID  . carvedilol  3.125 mg Oral BID WC  . clopidogrel  75 mg Oral Daily  . docusate sodium  100 mg Oral BID  . famotidine  20 mg Oral BID  . furosemide  40 mg Oral Daily  . heparin  5,000 Units Subcutaneous Q8H  . isosorbide mononitrate  30 mg Oral Daily  . OLANZapine  2.5 mg Oral QHS  . QUEtiapine  50 mg Oral QHS  . simvastatin  20 mg Oral QHS  . tiotropium  18 mcg Inhalation Daily  . traZODone  50 mg Oral QHS  . DISCONTD: aspirin EC  81 mg Oral Daily  . DISCONTD: levalbuterol  1.25 mg  Nebulization TID   PRN:  levalbuterol, nitroGLYCERIN, senna-docusate  Diet:  General thin liquids Activity:  Out of Bed DVT Prophylaxis:  Heparin 5000 units sq tid  CLINICALLY SIGNIFICANT STUDIES Basic Metabolic Panel:  Lab 10/06/11 4098  NA 141  K 3.2*  CL 99  CO2 29  GLUCOSE 95  BUN 10  CREATININE 1.01  CALCIUM 9.9  MG --  PHOS --   Liver Function Tests:  Lab 10/06/11 1726  AST 20  ALT 6  ALKPHOS 95  BILITOT 0.6  PROT 7.9  ALBUMIN 3.9   CBC:  Lab 10/06/11 1726  WBC 4.4  NEUTROABS 2.9  HGB 15.3*  HCT 45.9  MCV 90.4  PLT 206   Coagulation:  Lab 10/06/11 1825  LABPROT 13.0  INR 0.96   Cardiac Enzymes: No results found for this basename: CKTOTAL:3,CKMB:3,CKMBINDEX:3,TROPONINI:3 in the last 168 hours Urinalysis:  Lab 10/06/11 1725  COLORURINE YELLOW  LABSPEC 1.008  PHURINE 7.0  GLUCOSEU NEGATIVE  HGBUR NEGATIVE  BILIRUBINUR NEGATIVE  KETONESUR NEGATIVE  PROTEINUR NEGATIVE  UROBILINOGEN 0.2  NITRITE NEGATIVE  LEUKOCYTESUR NEGATIVE   Lipid Panel    Component  Value Date/Time   CHOL 213* 10/07/2011 0650   TRIG 73 10/07/2011 0650   HDL 100 10/07/2011 0650   CHOLHDL 2.1 10/07/2011 0650   VLDL 15 10/07/2011 0650   LDLCALC 98 10/07/2011 0650   HgbA1C  Lab Results  Component Value Date   HGBA1C  Value: 6.0 (NOTE)                                                                       According to the ADA Clinical Practice Recommendations for 2011, when HbA1c is used as a screening test:   >=6.5%   Diagnostic of Diabetes Mellitus           (if abnormal result  is confirmed)  5.7-6.4%   Increased risk of developing Diabetes Mellitus  References:Diagnosis and Classification of Diabetes Mellitus,Diabetes Care,2011,34(Suppl 1):S62-S69 and Standards of Medical Care in         Diabetes - 2011,Diabetes Care,2011,34  (Suppl 1):S11-S61.* 06/18/2010    Urine Drug Screen:     Component Value Date/Time   LABOPIA NONE DETECTED 06/15/2011 1400   LABOPIA NEGATIVE 04/01/2009 1007    COCAINSCRNUR NONE DETECTED 06/15/2011 1400   COCAINSCRNUR  Value: POSITIVE (NOTE) Result repeated and verified. Sent for confirmatory testing* 04/01/2009 1007   LABBENZ NONE DETECTED 06/15/2011 1400   LABBENZ NEGATIVE 04/01/2009 1007   AMPHETMU NONE DETECTED 06/15/2011 1400   AMPHETMU NEGATIVE 04/01/2009 1007   THCU NONE DETECTED 06/15/2011 1400   LABBARB NONE DETECTED 06/15/2011 1400    Alcohol Level: No results found for this basename: ETH:2 in the last 168 hours  CT of the brain  10/06/2011 Findings concerning for acute right parietal cortical and subcortical infarction without hemorrhage.  Areas of chronic infarction as described.    MRI of the brain    MRA of the brain    2D Echocardiogram    Carotid Doppler    CXR  10/06/2011 Cardiomegaly with COPD.  No definite active infiltrates.    EKG  normal sinus rhythm.   Therapy Recommendations PT - ; OT - ; ST -   Physical Exam    ASSESSMENT [name] is a 65 y.o. female presenting with new onset right hemiparesis and slurred speech per historyin lady with baseline left hemiparesis from old stroke. UDS negative for cocaine (has been positive in the past). Given resolution of right hemiparesis, possible worsening of old deficits without new stroke.  Imaging pending. On aspirin 81 mg orally every day and clopidogrel 75 mg orally every day prior to admission. Now on aspirin 325 mg orally every day and clopidogrel 75 mg orally every day for secondary stroke prevention. Patient with resultant slurred speech (?baseline), right hemiparesis has resolved.  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   Hospital day # 1  TREATMENT/PLAN -Decrease aspirin to  aspirin 81 mg orally every day along with plavix for secondary stroke prevention. -imaging to confirm new stroke  Annie Main, MSN, RN, ANVP-BC, ANP-BC,  GNP-BC [facility] Pager: [phone] 10/07/2011 10:02 AM  Scribe for Dr. Delia Heady, Stroke Center Medical Director, who has personally reviewed  chart, pertinent data, examined the patient and developed the plan of care. Pager:  [phone]

## 2011-10-07 NOTE — Progress Notes (Signed)
Subjective:  Patient denies any chest pain or shortness of breath left leg weakness slightly improved speech also improved  Objective:  Vital Signs in the last 24 hours: Temp:  [97.4 F (36.3 C)-98.8 F (37.1 C)] 98.1 F (36.7 C) (08/16 1722) Pulse Rate:  [48-95] 71  (08/16 1722) Resp:  [16-24] 20  (08/16 1722) BP: (90-148)/(54-110) 112/66 mmHg (08/16 1722) SpO2:  [94 %-100 %] 95 % (08/16 1722) Weight:  [40.8 kg (89 lb 15.2 oz)] 40.8 kg (89 lb 15.2 oz) (08/15 2220)  Intake/Output from previous day: 08/15 0701 - 08/16 0700 In: 155 [I.V.:155] Out: -  Intake/Output from this shift: Total I/O In: 960 [P.O.:960] Out: -   Physical Exam: Neck: no adenopathy, no carotid bruit, no JVD and supple, symmetrical, trachea midline Lungs: Decreased breath sound at bases Heart: regular rate and rhythm, S1, S2 normal and Soft systolic murmur noted Abdomen: soft, non-tender; bowel sounds normal; no masses,  no organomegaly Extremities: extremities normal, atraumatic, no cyanosis or edema  Lab Results:  Basename 10/06/11 1726  WBC 4.4  HGB 15.3*  PLT 206    Basename 10/06/11 1726  NA 141  K 3.2*  CL 99  CO2 29  GLUCOSE 95  BUN 10  CREATININE 1.01   No results found for this basename: TROPONINI:2,CK,MB:2 in the last 72 hours Hepatic Function Panel  Basename 10/06/11 1726  PROT 7.9  ALBUMIN 3.9  AST 20  ALT 6  ALKPHOS 95  BILITOT 0.6  BILIDIR --  IBILI --    Basename 10/07/11 0650  CHOL 213*   No results found for this basename: PROTIME in the last 72 hours  Imaging: Imaging results have been reviewed and Dg Chest 2 View  10/06/2011  *RADIOLOGY REPORT*  Clinical Data: Stroke symptoms with shortness of breath.  CHEST - 2 VIEW  Comparison: Most recent 06/15/2011.  Findings: Cardiomegaly.  COPD with hyperinflation.  No infiltrates or failure.  No effusion or pneumothorax.  Calcified tortuous aorta. Old left-sided rib fractures.  Similar appearance to priors.  IMPRESSION:  Cardiomegaly with COPD.  No definite active infiltrates.  Original Report Authenticated By: Elsie Stain, M.D.   Ct Head Wo Contrast  10/06/2011  *RADIOLOGY REPORT*  Clinical Data: Weakness and slurred speech; unable to walk without assistance.  Possible left-sided weakness.  CT HEAD WITHOUT CONTRAST  Technique:  Contiguous axial images were obtained from the base of the skull through the vertex without contrast.  Comparison: Previous study from 07/11/2010.  The  Findings: There are extensive  changes of chronic brain substance loss with  old right frontal and left temporo-occipital infarctions noted on previous study. There has been interval development of a moderately large left frontal suprasylvian cortical and subcortical infarct since the prior exam, although this too appears chronic.  There is an area of hypoattenuation in the right parietal cortex and subcortical white matter (images 14 - 17, series 2) concerning for acute infarction.  No visible acute hemorrhage.  No midline shift or hydrocephalus.  No extra-axial fluid.  Intact calvarium. Clear sinuses and mastoids.  IMPRESSION: Findings concerning for acute right parietal cortical and subcortical infarction without hemorrhage.  Areas of chronic infarction as described.  Original Report Authenticated By: Elsie Stain, M.D.    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

## 2011-10-08 MED ORDER — POTASSIUM CHLORIDE CRYS ER 20 MEQ PO TBCR
20.0000 meq | EXTENDED_RELEASE_TABLET | Freq: Three times a day (TID) | ORAL | Status: DC
  Administered 2011-10-08 (×3): 20 meq via ORAL
  Filled 2011-10-08 (×6): qty 1

## 2011-10-08 NOTE — Progress Notes (Signed)
Subjective:  No chest pain. No fever. No additional weakness.  Objective:  Vital Signs in the last 24 hours: Temp:  [97.9 F (36.6 C)-98.5 F (36.9 C)] 98.3 F (36.8 C) (08/17 1007) Pulse Rate:  [51-108] 58  (08/17 1007) Cardiac Rhythm:  [-] Normal sinus rhythm (08/17 0000) Resp:  [16-20] 18  (08/17 1007) BP: (90-118)/(52-75) 91/52 mmHg (08/17 1007) SpO2:  [89 %-100 %] 100 % (08/17 1007)  Physical Exam: BP Readings from Last 1 Encounters:  10/08/11 91/52    Wt Readings from Last 1 Encounters:  10/06/11 40.8 kg (89 lb 15.2 oz)    Weight change:   HEENT: [place]/AT, Eyes-Brown, PERL, EOMI, Conjunctiva-Pink, Sclera-Non-icteric Neck: No JVD, No bruit, Trachea midline. Lungs:  Clear, Bilateral. Cardiac:  Regular rhythm, normal S1 and S2, no S3.  Abdomen:  Soft, non-tender. Extremities:  No edema present. No cyanosis. No clubbing. CNS: AxOx3, Cranial nerves grossly intact, moves all 4 extremities. Right handed. Skin: Warm and dry.   Intake/Output from previous day: 08/16 0701 - 08/17 0700 In: 960 [P.O.:960] Out: -   MRI brain: 1. Confluent acute right MCA (posterior division) infarct with petechial hemorrhage and mild mass effect.  2. Chronic left MCA, right ACA, and left PCA infarcts, with progression of this ischemic disease since 2011.   MRA brain: 1. No major circle of Willis branch occlusion. Major right MCA branches are stable since 2011.  2. Interval attenuated flow in the right ACA branches.  3. Increased atherosclerosis of the distal vertebral arteries, greater on the right and resulting in stenosis of the distal      nondominant right vertebral.    Lab Results: BMET    Component Value Date/Time   NA 141 10/06/2011 1726   K 3.2* 10/06/2011 1726   CL 99 10/06/2011 1726   CO2 29 10/06/2011 1726   GLUCOSE 95 10/06/2011 1726   BUN 10 10/06/2011 1726   CREATININE 1.01 10/06/2011 1726   CALCIUM 9.9 10/06/2011 1726   GFRNONAA 57* 10/06/2011 1726   GFRAA 66* 10/06/2011 1726    CBC    Component Value Date/Time   WBC 4.4 10/06/2011 1726   WBC 5.3 03/01/2011 1601   RBC 5.08 10/06/2011 1726   RBC 4.28 03/01/2011 1601   HGB 15.3* 10/06/2011 1726   HGB 13.3 03/01/2011 1601   HCT 45.9 10/06/2011 1726   HCT 40.1 03/01/2011 1601   PLT 206 10/06/2011 1726   PLT 194 03/01/2011 1601   MCV 90.4 10/06/2011 1726   MCV 93.8 03/01/2011 1601   MCH 30.1 10/06/2011 1726   MCH 31.0 03/01/2011 1601   MCHC 33.3 10/06/2011 1726   MCHC 33.0 03/01/2011 1601   RDW 13.8 10/06/2011 1726   RDW 14.5 03/01/2011 1601   LYMPHSABS 0.9 10/06/2011 1726   LYMPHSABS 0.9 03/01/2011 1601   MONOABS 0.5 10/06/2011 1726   MONOABS 0.3 03/01/2011 1601   EOSABS 0.0 10/06/2011 1726   EOSABS 0.1 03/01/2011 1601   BASOSABS 0.0 10/06/2011 1726   BASOSABS 0.0 03/01/2011 1601   CARDIAC ENZYMES Lab Results  Component Value Date   CKTOTAL 117 04/09/2011   CKMB 4.1* 04/09/2011   TROPONINI <0.30 04/09/2011    Assessment/Plan:  Patient Active Hospital Problem List: 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 Continue medical treatment   LOS: 2 days    Orpah Cobb  MD  10/08/2011, 10:10 AM

## 2011-10-08 NOTE — Progress Notes (Signed)
Occupational Therapy Evaluation Patient Details Name: REGINA GANCI MRN: [id number] DOB: [birth year] Today's Date: 10/08/2011 Time: 4098-1191 OT Time Calculation (min): 20 min  OT Assessment / Plan / Recommendation Clinical Impression  Pt is a 66 yo s/p multiple CVAs with recent R parietal post CVA. Pt presents with abnormal posture and movemnt patterns, some premorbid. Pt previously participated in CIR during previous hospitalization. Pt unsafe to D/C home alone due to apparent visual and cognitive deficits. If pt is agreeable, would benefit from [facility]. If not, pt will need SNF for rehab.    OT Assessment  Patient needs continued OT Services    Follow Up Recommendations  Inpatient Rehab    Barriers to Discharge Decreased caregiver support    Equipment Recommendations  None recommended by OT    Recommendations for Other Services Rehab consult  Frequency  Min 3X/week    Precautions / Restrictions Precautions Precautions: Fall   Pertinent Vitals/Pain C/o dizziness and SOB. O2 sats 96 RA. HR 58    ADL  Eating/Feeding: Simulated;Set up Where Assessed - Eating/Feeding: Edge of bed Grooming: Performed;Supervision/safety Where Assessed - Grooming: Supported standing Upper Body Bathing: Simulated;Supervision/safety Where Assessed - Upper Body Bathing: Unsupported sitting Lower Body Bathing: Simulated;Minimal assistance Where Assessed - Lower Body Bathing: Supported sit to stand Upper Body Dressing: Simulated;Minimal assistance Where Assessed - Upper Body Dressing: Unsupported sitting Lower Body Dressing: Simulated;Minimal assistance Where Assessed - Lower Body Dressing: Supported sit to Pharmacist, hospital: Performed;Minimal Dentist Method: Sit to Barista: Comfort height toilet Toileting - Clothing Manipulation and Hygiene: Performed;Minimal assistance Where Assessed - Engineer, mining and Hygiene: Other  (comment);Standing Transfers/Ambulation Related to ADLs: Min A. Abmormal gait pattern from previous CVA. Pt states LLE is worse. ADL Comments: A for safety. Difficulty with spatial relations.    OT Diagnosis: Generalized weakness;Cognitive deficits;Disturbance of vision;Ataxia  OT Problem List: Decreased strength;Decreased range of motion;Decreased activity tolerance;Impaired balance (sitting and/or standing);Impaired vision/perception;Decreased coordination;Decreased cognition;Decreased safety awareness;Decreased knowledge of use of DME or AE;Decreased knowledge of precautions;Impaired sensation;Impaired tone;Impaired UE functional use OT Treatment Interventions: Self-care/ADL training;Therapeutic exercise;Neuromuscular education;Energy conservation;DME and/or AE instruction;Therapeutic activities;Cognitive remediation/compensation;Visual/perceptual remediation/compensation;Patient/family education;Balance training   OT Goals Acute Rehab OT Goals OT Goal Formulation: With patient Time For Goal Achievement: 10/15/11 Potential to Achieve Goals: Good ADL Goals Pt Will Perform Upper Body Bathing: with modified independence;Unsupported;Sitting, chair ADL Goal: Upper Body Bathing - Progress: Goal set today Pt Will Perform Lower Body Bathing: with supervision;with caregiver independent in assisting;Unsupported ADL Goal: Lower Body Bathing - Progress: Goal set today Pt Will Perform Upper Body Dressing: with modified independence;Sitting, chair;Unsupported ADL Goal: Upper Body Dressing - Progress: Goal set today Pt Will Perform Lower Body Dressing: with supervision;with caregiver independent in assisting;Unsupported ADL Goal: Lower Body Dressing - Progress: Goal set today Pt Will Transfer to Toilet: with supervision;with caregiver independent in assisting;Ambulation ADL Goal: Toilet Transfer - Progress: Goal set today Pt Will Perform Toileting - Clothing Manipulation: with modified  independence;Standing ADL Goal: Toileting - Clothing Manipulation - Progress: Goal set today Pt Will Perform Toileting - Hygiene: with modified independence;Standing at 3-in-1/toilet;Sit to stand from 3-in-1/toilet ADL Goal: Toileting - Hygiene - Progress: Goal set today  Visit Information  Last OT Received On: 10/08/11    Subjective Data   l live by myself   Prior Functioning  Vision/Perception  Home Living Lives With: Alone Available Help at Discharge: Family Type of Home: Apartment Home Access: Stairs to enter [employer] of Steps: 1 Home Layout:  One level Bathroom Shower/Tub: Engineer, manufacturing systems: Standard Home Adaptive Equipment: Walker - rolling;Straight cane;Shower chair with back;Bedside commode/3-in-1 Additional Comments: attempted to call both sisters. No contact. Prior Function Level of Independence: Needs assistance Needs Assistance: Meal Prep;Light Housekeeping Meal Prep: Maximal Light Housekeeping: Moderate Able to Take Stairs?: Yes Driving: No Communication Communication: Expressive difficulties Dominant Hand: Right   Vision - Assessment Eye Alignment: Within Functional Limits Vision Assessment: Vision tested Alignment/Gaze Preference: Gaze right Tracking/Visual Pursuits: Decreased smoothness of horizontal tracking;Unable to hold eye position out of midline;Impaired - to be further tested in functional context (difficulty with R field) Saccades: Additional head turns occurred during testing;Decreased speed of saccadic movement;Impaired - to be further tested in functional context Visual Fields: Right visual field deficit;Impaired - to be further tested in functional context Additional Comments: Unable to maitain visual fixation in R visual field  Perception Perception: Impaired Praxis Praxis: Intact  Cognition  Overall Cognitive Status: Impaired Area of Impairment: Attention;Memory;Safety/judgement;Awareness of deficits;Problem  solving;Executive functioning Arousal/Alertness: Lethargic Orientation Level: Disoriented to;Time Behavior During Session: Anxious Current Attention Level: Sustained Memory: Decreased recall of precautions Memory Deficits: decrased STM Safety/Judgement: Decreased safety judgement for tasks assessed Problem Solving: extended time for problem solving Executive Functioning: poor self regulatory behavior    Extremity/Trunk Assessment Right Upper Extremity Assessment RUE ROM/Strength/Tone: WFL for tasks assessed RUE Sensation: WFL - Light Touch;WFL - Proprioception RUE Coordination: Deficits Left Upper Extremity Assessment LUE ROM/Strength/Tone: Deficits;Unable to fully assess LUE ROM/Strength/Tone Deficits: pt c/op fatigue and asking to sit and stated she would move her arm "later" LUE Sensation: Deficits LUE Coordination: Deficits (ataxic type movements. ?premorbid) Left Lower Extremity Assessment LLE ROM/Strength/Tone: Deficits   Mobility Bed Mobility Bed Mobility: Supine to Sit Supine to Sit: 4: Min assist Transfers Transfers: Sit to Stand;Stand to Sit Sit to Stand: 4: Min assist Stand to Sit: 4: Min assist;With upper extremity assist Details for Transfer Assistance: impulsive   Exercise    Balance  poor  End of Session OT - End of Session Equipment Utilized During Treatment: Gait belt Activity Tolerance: Patient limited by fatigue Patient left: in bed;with call bell/phone within reach;with bed alarm set Nurse Communication: Other (comment) (D/C concerns/planning)  GO     Denim Kalmbach,HILLARY 10/08/2011, 1:21 PM [facility], OTR/L  [phone] 10/08/2011

## 2011-10-08 NOTE — Progress Notes (Signed)
Stroke Team Progress Note  HISTORY [name] is an 66 y.o. female who reports going to bed last evening and being at baseline. It seems that her baseline is left sided weakness, Patient is a poor historian. On 09/1511 upon awakening, was unable to dress herself and had slurred speech. Was also unable to ambulate without assistance. Patient was brought in for evaluation by EMS. In work up head CT was performed and shows areas of old infarctions and a evidence of possible acute right parietal cortical and subcortical infarctions. Patient was not a TPA candidate secondary to delay in arrival. She was admitted for further evaluation and treatment.  SUBJECTIVE Patient denies any weakness, trouble speaking or swallowing this am.  OBJECTIVE Most recent Vital Signs: Filed Vitals:   10/08/11 0143 10/08/11 0636 10/08/11 0752 10/08/11 1007  BP: 118/75 101/69  91/52  Pulse: 108 51  58  Temp:  97.9 F (36.6 C)  98.3 F (36.8 C)  TempSrc:  Oral  Oral  Resp: 20 18  18   Height:      Weight:      SpO2: 100% 96% 89% 100%   CBG (last 3)   Basename 10/06/11 1723  GLUCAP 93   Intake/Output from previous day: 08/16 0701 - 08/17 0700 In: 960 [P.O.:960] Out: -   IV Fluid Intake:      . sodium chloride 20 mL/hr (10/06/11 2245)    MEDICATIONS     . aspirin  81 mg Oral Daily  . budesonide-formoterol  2 puff Inhalation BID  . carvedilol  3.125 mg Oral BID WC  . clopidogrel  75 mg Oral Daily  . docusate sodium  100 mg Oral BID  . famotidine  20 mg Oral BID  . furosemide  40 mg Oral Daily  . heparin  5,000 Units Subcutaneous Q8H  . isosorbide mononitrate  30 mg Oral Daily  . OLANZapine  2.5 mg Oral QHS  . potassium chloride  20 mEq Oral TID  . QUEtiapine  50 mg Oral QHS  . simvastatin  20 mg Oral QHS  . tiotropium  18 mcg Inhalation Daily  . traZODone  50 mg Oral QHS  . DISCONTD: aspirin  300 mg Rectal Daily  . DISCONTD: aspirin  325 mg Oral Daily   PRN:  levalbuterol,  nitroGLYCERIN, senna-docusate  Diet:  General thin liquids Activity:  Out of Bed DVT Prophylaxis:  Heparin 5000 units sq tid  CLINICALLY SIGNIFICANT STUDIES Basic Metabolic Panel:  Lab 10/06/11 4540  NA 141  K 3.2*  CL 99  CO2 29  GLUCOSE 95  BUN 10  CREATININE 1.01  CALCIUM 9.9  MG --  PHOS --   Liver Function Tests:  Lab 10/06/11 1726  AST 20  ALT 6  ALKPHOS 95  BILITOT 0.6  PROT 7.9  ALBUMIN 3.9   CBC:  Lab 10/06/11 1726  WBC 4.4  NEUTROABS 2.9  HGB 15.3*  HCT 45.9  MCV 90.4  PLT 206   CBG:  Lab 10/06/11 1723  GLUCAP 93   Hemoglobin A1C:  Lab 10/07/11 0650  HGBA1C 5.9*   Fasting Lipid Panel:  Lab 10/07/11 0650  CHOL 213*  HDL 100  LDLCALC 98  TRIG 73  CHOLHDL 2.1  LDLDIRECT --   Coagulation:  Lab 10/06/11 1825  LABPROT 13.0  INR 0.96    Urinalysis:  Lab 10/06/11 1725  COLORURINE YELLOW  LABSPEC 1.008  PHURINE 7.0  GLUCOSEU NEGATIVE  HGBUR NEGATIVE  BILIRUBINUR NEGATIVE  KETONESUR NEGATIVE  PROTEINUR NEGATIVE  UROBILINOGEN 0.2  NITRITE NEGATIVE  LEUKOCYTESUR NEGATIVE     10/06/2011  CHEST - 2 VIEW    Findings: Cardiomegaly.  COPD with hyperinflation.  No infiltrates or failure.  No effusion or pneumothorax.  Calcified tortuous aorta. Old left-sided rib fractures.  Similar appearance to priors.  IMPRESSION: Cardiomegaly with COPD.  No definite active infiltrates.   Elsie Stain, M.D.   10/06/2011  CT HEAD WITHOUT CONTRAST    Findings: There are extensive  changes of chronic brain substance loss with  old right frontal and left temporo-occipital infarctions noted on previous study. There has been interval development of a moderately large left frontal suprasylvian cortical and subcortical infarct since the prior exam, although this too appears chronic.  There is an area of hypoattenuation in the right parietal cortex and subcortical white matter (images 14 - 17, series 2) concerning for acute infarction.  No visible acute  hemorrhage.  No midline shift or hydrocephalus.  No extra-axial fluid.  Intact calvarium. Clear sinuses and mastoids.  IMPRESSION: Findings concerning for acute right parietal cortical and subcortical infarction without hemorrhage.  Areas of chronic infarction as described.  Elsie Stain, M.D.   10/07/2011   MRI HEAD WITHOUT CONTRAST  Findings: Confluent right MCA posterior division territory infarct, mostly involving the posterior right temporal lobe and portions of the right occipital lobe (series 3 image 11).  Associated confluent gyral edema.  Mild to moderate mass effect on the right temporal horn.  Associated petechial hemorrhage (series 7 image 12).  Additional punctate focus of mildly restricted diffusion in the left MCA posterior division territory, abutting an area of chronic encephalomalacia.  Series 3 image 14.  No ventriculomegaly. Major intracranial vascular flow voids are stable since 2011. Chronic left PCA territory infarct with encephalomalacia. Occasional tiny chronic lacunar infarcts in the right cerebellum are new since 2011.  Right and ACA chronic infarct with encephalomalacia is new since 2011.  Occasional chronic lacunar infarcts in the deep gray matter nuclei are new.  No extra-axial collection.  Negative visualized pituitary, cervicomedullary junction, and cervical spine.  Normal bone marrow signal.  Postoperative changes to the globes. Visualized paranasal sinuses and mastoids are clear.  Negative scalp soft tissues.  IMPRESSION: 1.  Confluent acute right MCA (posterior division) infarct with petechial hemorrhage and mild mass effect. 2.  Chronic left MCA, right ACA, and left PCA infarcts, with progression of this ischemic disease since 2011.  H.LEE HALL III, M.D.   MRA HEAD WITHOUT CONTRAST   Findings: Antegrade flow in the posterior circulation with mildly dominant distal left vertebral artery.  Chronic distal vertebral artery tortuosity is stable.  Irregularity of the distal most  right V4 segment, beyond PICA origin, has increased.  Irregularity of the distal left vertebral artery is increased but there is no significant stenosis.  Normal left PICA.  Basilar artery remains patent without stenosis.  SCA and PCA origins are within normal limits.  Right posterior communicating artery is present, the left is diminutive or absent.  Bilateral PCA branches are stable.  Antegrade flow in both ICA siphons.  No ICA stenosis.  Carotid termini are patent.  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  The right ACA is dominant as before with no visible left A1 segment.  Normal anterior communicating artery.  Left ACA branches are within normal limits.  The right ACA branches now are attenuated.  Left MCA M1 segment remains within normal limits. Left MCA  branches are stable and within normal limits, infundibulum of the left lenticulostriate origin again noted.  On the right, the right MCA M1 segment is stable and within normal limits.  The right MCA bifurcation is patent and stable., the major right MCA M2 branches are stable and patent.  No proximal or major branch occlusion identified.  IMPRESSION: 1.  No major circle of Willis branch occlusion.  Major right MCA branches are stable since 2011. 2.  Interval attenuated flow in the right ACA branches. 3.  Increased atherosclerosis of the distal vertebral arteries, greater on the right and resulting in stenosis of the distal nondominant right vertebral.   H.LEE HALL III, M.D.   10/07/11 2D Echocardiogram  Left ventricle: The cavity size was mildly dilated. Systolic function was severely reduced. The estimated ejection fraction was in the range of 10% to 15%. Diffuse hypokinesis. Ventricular septum: Septal motion showed abnormal function and dyssynergy. Atrial septum: No defect or patent foramen ovale wasidentified. Impressions: No cardiac source of embolism was identified, but cannot be ruled out on the basis  of this examination.  10/07/11 Carotid Doppler  Technically difficult due to constant movement and respiratory interference. - Right - Mild wall changes of the CCA. moderate calcific plaque at the bifurcation. No obvious evidence of significant ICA stenosis. Vertebral artery flow is antegrade with loss of diastolic component. - Left - Mild wall changes in the CCA. Moderate to severe irregular heterogeneous plaque at the bifurcation with diminished velocities throughout the ICA. Vertebral artery flow is antegrade with diminished diastolic component.  10/06/2011 CXR  Cardiomegaly with COPD.  No definite active infiltrates.    EKG  normal sinus rhythm.   Therapy Recommendations PT HHPT   Physical Exam : Cachectic black woman in NAD.   Lungs CTA anteriorly Cor RRR without murmer. Extremities without edema.  Mental Status: Alert, oriented to self and place, poor insight as to reason for hospitalization.  Cannot recall year or age, knows birth date.  Unable to repeat or name objects correctly.  Minimal dysarthria. Able to follow simple commands - 2 step commands.  Decent attention.  Cranial Nerves: II-Patient able to count fingers on examiners hand correctly, unable to comment on other visual clues. III/IV/VI-Extraocular movements intact.  Pupils reactive bilaterally. V/VII-Smile symmetric, facial sensation intact. VIII-grossly intact IX/X-normal elevation uvula XI-bilateral shoulder shrug intact XII-midline tongue extension Motor: 5/5 bilaterally with normal tone and bulk Sensory: light touch intact throughout, bilaterally Deep Tendon Reflexes: 2+ and symmetric throughout; reflex assessment on left patella stimulates right leg jerk. Plantars: upgoing bilaterally. Cerebellar: Normal finger-to-nose bilaterally. Orbits right hand around left.  ASSESSMENT [name] is a 66 y.o. female  With extensive past medical history including prior CVA and chronic left sided weakness.  MRI  head positive for confluent acute right MCA (posterior division) infarct with petechial hemorrhage and mild mass effect and chronic left MCA, right ACA, and left PCA infarcts, with progression of this ischemic disease since 2011.  Her presenting weakness and slurred speech is improved, however, true basel ine unknown.  Echo shows severe LVD with EF 10-15 %, no PFO or cardiac source of emboli.  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   Hospital day # 2  TREATMENT/PLAN -Decrease aspirin to  aspirin 81 mg orally every day along with plavix for secondary stroke prevention.  Patient not a candidate for coumadin anticoagulation or aggressive intervention due to severity of concomitant disease processes.   -Continue therapy.  -CIR consult.  Discussed with Dr. Pearlean Brownie.  Marya Fossa PA-C [facility] [phone] 10/08/2011 10:29 AM   I have personally examined this patient, develop plan of care and agree with above. D/w Dr Algie Coffer Delia Heady, MD Medical Director [facility] Pager: [phone] 10/08/2011 11:15 AM

## 2011-10-09 ENCOUNTER — Inpatient Hospital Stay (HOSPITAL_COMMUNITY): Payer: Medicare Other

## 2011-10-09 LAB — BASIC METABOLIC PANEL
BUN: 20 mg/dL (ref 6–23)
CO2: 24 mEq/L (ref 19–32)
GFR calc non Af Amer: 48 mL/min — ABNORMAL LOW (ref >90)
Glucose, Bld: 79 mg/dL (ref 70–99)
Potassium: 4.4 mEq/L (ref 3.5–5.1)
Sodium: 139 mEq/L (ref 135–145)

## 2011-10-09 LAB — CBC
HCT: 40.2 % (ref 36.0–46.0)
Hemoglobin: 13.2 g/dL (ref 12.0–15.0)
MCHC: 32.8 g/dL (ref 30.0–36.0)
RBC: 4.37 MIL/uL (ref 3.87–5.11)

## 2011-10-09 MED ORDER — ACETAMINOPHEN 325 MG PO TABS
650.0000 mg | ORAL_TABLET | Freq: Four times a day (QID) | ORAL | Status: DC | PRN
  Administered 2011-10-10: 650 mg via ORAL
  Filled 2011-10-09 (×2): qty 2

## 2011-10-09 MED ORDER — POTASSIUM CHLORIDE CRYS ER 10 MEQ PO TBCR
10.0000 meq | EXTENDED_RELEASE_TABLET | Freq: Every day | ORAL | Status: DC
  Administered 2011-10-09 – 2011-10-11 (×3): 10 meq via ORAL
  Filled 2011-10-09 (×3): qty 1

## 2011-10-09 NOTE — Progress Notes (Signed)
Stroke Team Progress Note  HISTORY [name] is an 66 y.o. female who reports going to bed last evening and being at baseline. It seems that her baseline is left sided weakness, Patient is a poor historian. On 09/1511 upon awakening, was unable to dress herself and had slurred speech. Was also unable to ambulate without assistance. Patient was brought in for evaluation by EMS. In work up head CT was performed and shows areas of old infarctions and a evidence of possible acute right parietal cortical and subcortical infarctions. Patient was not a TPA candidate secondary to delay in arrival. She was admitted for further evaluation and treatment.  SUBJECTIVE Patient having trouble getting comfortable in the bed.  Doesn't like the socks on her feet.  No family present.  OBJECTIVE Most recent Vital Signs: Filed Vitals:   10/08/11 2319 10/09/11 0200 10/09/11 0600 10/09/11 1017  BP: 113/72 110/68 108/65 117/76  Pulse: 68 63 56 55  Temp:  97.9 F (36.6 C) 98.1 F (36.7 C) 97.4 F (36.3 C)  TempSrc:    Oral  Resp:  14 16 18   Height:      Weight:      SpO2:  100% 95% 100%   CBG (last 3)   Basename 10/06/11 1723  GLUCAP 93   Intake/Output from previous day:    IV Fluid Intake:      . sodium chloride 20 mL/hr (10/06/11 2245)    MEDICATIONS     . aspirin  81 mg Oral Daily  . budesonide-formoterol  2 puff Inhalation BID  . carvedilol  3.125 mg Oral BID WC  . clopidogrel  75 mg Oral Daily  . docusate sodium  100 mg Oral BID  . famotidine  20 mg Oral BID  . furosemide  40 mg Oral Daily  . heparin  5,000 Units Subcutaneous Q8H  . isosorbide mononitrate  30 mg Oral Daily  . OLANZapine  2.5 mg Oral QHS  . potassium chloride  10 mEq Oral Daily  . QUEtiapine  50 mg Oral QHS  . simvastatin  20 mg Oral QHS  . tiotropium  18 mcg Inhalation Daily  . traZODone  50 mg Oral QHS  . DISCONTD: potassium chloride  20 mEq Oral TID   PRN:  acetaminophen, levalbuterol, nitroGLYCERIN,  senna-docusate  Diet:  General thin liquids Activity:  Out of Bed DVT Prophylaxis:  Heparin 5000 units sq tid  CLINICALLY SIGNIFICANT STUDIES Basic Metabolic Panel:  Lab 10/09/11 1610 10/06/11 1726  NA 139 141  K 4.4 3.2*  CL 105 99  CO2 24 29  GLUCOSE 79 95  BUN 20 10  CREATININE 1.16* 1.01  CALCIUM 9.7 9.9  MG -- --  PHOS -- --   Liver Function Tests:  Lab 10/06/11 1726  AST 20  ALT 6  ALKPHOS 95  BILITOT 0.6  PROT 7.9  ALBUMIN 3.9   CBC:  Lab 10/09/11 0800 10/06/11 1726  WBC 3.5* 4.4  NEUTROABS -- 2.9  HGB 13.2 15.3*  HCT 40.2 45.9  MCV 92.0 90.4  PLT 172 206   CBG:  Lab 10/06/11 1723  GLUCAP 93   Hemoglobin A1C:  Lab 10/07/11 0650  HGBA1C 5.9*   Fasting Lipid Panel:  Lab 10/07/11 0650  CHOL 213*  HDL 100  LDLCALC 98  TRIG 73  CHOLHDL 2.1  LDLDIRECT --   Coagulation:  Lab 10/06/11 1825  LABPROT 13.0  INR 0.96    Urinalysis:  Lab 10/06/11 1725  COLORURINE YELLOW  LABSPEC 1.008  PHURINE 7.0  GLUCOSEU NEGATIVE  HGBUR NEGATIVE  BILIRUBINUR NEGATIVE  KETONESUR NEGATIVE  PROTEINUR NEGATIVE  UROBILINOGEN 0.2  NITRITE NEGATIVE  LEUKOCYTESUR NEGATIVE     10/06/2011  CHEST - 2 VIEW    Findings: Cardiomegaly.  COPD with hyperinflation.  No infiltrates or failure.  No effusion or pneumothorax.  Calcified tortuous aorta. Old left-sided rib fractures.  Similar appearance to priors.  IMPRESSION: Cardiomegaly with COPD.  No definite active infiltrates.   Elsie Stain, M.D.   10/06/2011  CT HEAD WITHOUT CONTRAST    Findings: There are extensive  changes of chronic brain substance loss with  old right frontal and left temporo-occipital infarctions noted on previous study. There has been interval development of a moderately large left frontal suprasylvian cortical and subcortical infarct since the prior exam, although this too appears chronic.  There is an area of hypoattenuation in the right parietal cortex and subcortical white matter (images 14  - 17, series 2) concerning for acute infarction.  No visible acute hemorrhage.  No midline shift or hydrocephalus.  No extra-axial fluid.  Intact calvarium. Clear sinuses and mastoids.  IMPRESSION: Findings concerning for acute right parietal cortical and subcortical infarction without hemorrhage.  Areas of chronic infarction as described.  Elsie Stain, M.D.   10/07/2011   MRI HEAD WITHOUT CONTRAST  Findings: Confluent right MCA posterior division territory infarct, mostly involving the posterior right temporal lobe and portions of the right occipital lobe (series 3 image 11).  Associated confluent gyral edema.  Mild to moderate mass effect on the right temporal horn.  Associated petechial hemorrhage (series 7 image 12).  Additional punctate focus of mildly restricted diffusion in the left MCA posterior division territory, abutting an area of chronic encephalomalacia.  Series 3 image 14.  No ventriculomegaly. Major intracranial vascular flow voids are stable since 2011. Chronic left PCA territory infarct with encephalomalacia. Occasional tiny chronic lacunar infarcts in the right cerebellum are new since 2011.  Right and ACA chronic infarct with encephalomalacia is new since 2011.  Occasional chronic lacunar infarcts in the deep gray matter nuclei are new.  No extra-axial collection.  Negative visualized pituitary, cervicomedullary junction, and cervical spine.  Normal bone marrow signal.  Postoperative changes to the globes. Visualized paranasal sinuses and mastoids are clear.  Negative scalp soft tissues.  IMPRESSION: 1.  Confluent acute right MCA (posterior division) infarct with petechial hemorrhage and mild mass effect. 2.  Chronic left MCA, right ACA, and left PCA infarcts, with progression of this ischemic disease since 2011.  H.LEE HALL III, M.D.   MRA HEAD WITHOUT CONTRAST   Findings: Antegrade flow in the posterior circulation with mildly dominant distal left vertebral artery.  Chronic distal  vertebral artery tortuosity is stable.  Irregularity of the distal most right V4 segment, beyond PICA origin, has increased.  Irregularity of the distal left vertebral artery is increased but there is no significant stenosis.  Normal left PICA.  Basilar artery remains patent without stenosis.  SCA and PCA origins are within normal limits.  Right posterior communicating artery is present, the left is diminutive or absent.  Bilateral PCA branches are stable.  Antegrade flow in both ICA siphons.  No ICA stenosis.  Carotid termini are patent.  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  The right ACA is dominant as before with no visible left A1 segment.  Normal anterior communicating artery.  Left ACA branches are within normal limits.  The right ACA branches now are attenuated.  Left MCA M1 segment remains within normal limits. Left MCA branches are stable and within normal limits, infundibulum of the left lenticulostriate origin again noted.  On the right, the right MCA M1 segment is stable and within normal limits.  The right MCA bifurcation is patent and stable., the major right MCA M2 branches are stable and patent.  No proximal or major branch occlusion identified.  IMPRESSION: 1.  No major circle of Willis branch occlusion.  Major right MCA branches are stable since 2011. 2.  Interval attenuated flow in the right ACA branches. 3.  Increased atherosclerosis of the distal vertebral arteries, greater on the right and resulting in stenosis of the distal nondominant right vertebral.   H.LEE HALL III, M.D.   10/07/11 2D Echocardiogram  Left ventricle: The cavity size was mildly dilated. Systolic function was severely reduced. The estimated ejection fraction was in the range of 10% to 15%. Diffuse hypokinesis. Ventricular septum: Septal motion showed abnormal function and dyssynergy. Atrial septum: No defect or patent foramen ovale wasidentified. Impressions: No cardiac  source of embolism was identified, but cannot be ruled out on the basis of this examination.  10/07/11 Carotid Doppler  Technically difficult due to constant movement and respiratory interference. - Right - Mild wall changes of the CCA. moderate calcific plaque at the bifurcation. No obvious evidence of significant ICA stenosis. Vertebral artery flow is antegrade with loss of diastolic component. - Left - Mild wall changes in the CCA. Moderate to severe irregular heterogeneous plaque at the bifurcation with diminished velocities throughout the ICA. Vertebral artery flow is antegrade with diminished diastolic component.  10/06/2011 CXR  Cardiomegaly with COPD.  No definite active infiltrates.    EKG  normal sinus rhythm.   Therapy Recommendations PT HHPT    Mental Status: Alert, oriented to self, poor insight as to reason for hospitalization.  Cannot recall year or age, knows birth date.  Unable to repeat or name objects correctly.  Minimal dysarthria. Able to follow simple commands - 2 step commands. More attentive today attention.  Cranial Nerves: II-Patient able to count fingers on examiners hand correctly, unable to comment on other visual clues. III/IV/VI-Extraocular movements intact.  Pupils reactive bilaterally. V/VII-Smile symmetric, facial sensation intact. VIII-grossly intact IX/X-normal elevation uvula XI-bilateral shoulder shrug intact XII-midline tongue extension Motor: 5/5 bilaterally with normal tone and bulk Sensory: light touch intact throughout, bilaterally Deep Tendon Reflexes: 2+ and symmetric throughout;  Plantars: upgoing bilaterally. Cerebellar: Normal finger-to-nose bilaterally. Orbits right hand around left.  Physical Exam :  Lungs CTA anteriorly Cor RRR without murmer. Extremities without edema.  ASSESSMENT [name] is a 65 y.o. female  With extensive past medical history including prior CVA and chronic left sided weakness.  MRI head positive for  confluent acute right MCA (posterior division) infarct with petechial hemorrhage and mild mass effect and chronic left MCA, right ACA, and left PCA infarcts, with progression of this ischemic disease since 2011.  Her presenting weakness and slurred speech is improved, however, true basel ine unknown.  Echo shows severe LVD with EF 10-15 %, no PFO or cardiac source of emboli.  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   Hospital day # 3  TREATMENT/PLAN Hold Imdur due to borderline low BPs. Further med adjustments to be directed  to Dr. Algie Coffer if needed.  -Decrease aspirin to  aspirin 81 mg orally every day along with plavix for secondary stroke prevention.  Patient not a candidate for coumadin anticoagulation or aggressive intervention due to severity of concomitant disease processes.   -Continue therapy.  -CIR consult.  Discussed with Dr. Pearlean Brownie.  Marya Fossa PA-C [facility] [phone] 10/09/2011 10:43 AM I have personally examined this patient, reviewed data and participated in plan of care.  Delia Heady, MD Medical Director [facility] Pager: [phone] 10/09/2011 10:43 AM

## 2011-10-09 NOTE — Progress Notes (Signed)
Subjective:  Not ready to go home today.  Objective:  Vital Signs in the last 24 hours: Temp:  [97.4 F (36.3 C)-98.3 F (36.8 C)] 98.1 F (36.7 C) (08/18 0600) Pulse Rate:  [56-82] 56  (08/18 0600) Cardiac Rhythm:  [-] Sinus bradycardia (08/18 0728) Resp:  [14-18] 16  (08/18 0600) BP: (91-113)/(52-72) 108/65 mmHg (08/18 0600) SpO2:  [95 %-100 %] 95 % (08/18 0600)  Physical Exam: BP Readings from Last 1 Encounters:  10/09/11 108/65    Wt Readings from Last 1 Encounters:  10/06/11 40.8 kg (89 lb 15.2 oz)    Weight change:   HEENT: Hand/AT, Eyes-Brown, PERL, EOMI, Conjunctiva-Pink, Sclera-Non-icteric Neck: No JVD, No bruit, Trachea midline. Lungs:  Clear, Bilateral. Cardiac:  Regular rhythm, normal S1 and S2, no S3.  Abdomen:  Soft, non-tender. Extremities:  No edema present. No cyanosis. No clubbing. CNS: AxOx3, Cranial nerves grossly intact, moves all 4 extremities. Right handed. Skin: Warm and dry.   Intake/Output from previous day:      Lab Results: BMET    Component Value Date/Time   NA 139 10/09/2011 0800   K 4.4 10/09/2011 0800   CL 105 10/09/2011 0800   CO2 24 10/09/2011 0800   GLUCOSE 79 10/09/2011 0800   BUN 20 10/09/2011 0800   CREATININE 1.16* 10/09/2011 0800   CALCIUM 9.7 10/09/2011 0800   GFRNONAA 48* 10/09/2011 0800   GFRAA 56* 10/09/2011 0800   CBC    Component Value Date/Time   WBC 3.5* 10/09/2011 0800   WBC 5.3 03/01/2011 1601   RBC 4.37 10/09/2011 0800   RBC 4.28 03/01/2011 1601   HGB 13.2 10/09/2011 0800   HGB 13.3 03/01/2011 1601   HCT 40.2 10/09/2011 0800   HCT 40.1 03/01/2011 1601   PLT 172 10/09/2011 0800   PLT 194 03/01/2011 1601   MCV 92.0 10/09/2011 0800   MCV 93.8 03/01/2011 1601   MCH 30.2 10/09/2011 0800   MCH 31.0 03/01/2011 1601   MCHC 32.8 10/09/2011 0800   MCHC 33.0 03/01/2011 1601   RDW 14.0 10/09/2011 0800   RDW 14.5 03/01/2011 1601   LYMPHSABS 0.9 10/06/2011 1726   LYMPHSABS 0.9 03/01/2011 1601   MONOABS 0.5 10/06/2011 1726   MONOABS 0.3 03/01/2011  1601   EOSABS 0.0 10/06/2011 1726   EOSABS 0.1 03/01/2011 1601   BASOSABS 0.0 10/06/2011 1726   BASOSABS 0.0 03/01/2011 1601   CARDIAC ENZYMES Lab Results  Component Value Date   CKTOTAL 117 04/09/2011   CKMB 4.1* 04/09/2011   TROPONINI <0.30 04/09/2011    Assessment/Plan:  Patient Active Hospital Problem List: 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  Continue medical treatment    LOS: 3 days    Orpah Cobb  MD  10/09/2011, 9:53 AM

## 2011-10-10 DIAGNOSIS — I6330 Cerebral infarction due to thrombosis of unspecified cerebral artery: Secondary | ICD-10-CM

## 2011-10-10 DIAGNOSIS — M79609 Pain in unspecified limb: Secondary | ICD-10-CM

## 2011-10-10 NOTE — Progress Notes (Signed)
Stroke Team Progress Note  HISTORY [name] is an 66 y.o. female who reports going to bed last evening and being at baseline. It seems that her baseline is left sided weakness, Patient is a poor historian. On 09/1511 upon awakening, was unable to dress herself and had slurred speech. Was also unable to ambulate without assistance. Patient was brought in for evaluation by EMS. In work up head CT was performed and shows areas of old infarctions and a evidence of possible acute right parietal cortical and subcortical infarctions. Patient was not a TPA candidate secondary to delay in arrival. She was admitted for further evaluation and treatment.  SUBJECTIVE Patient having trouble getting comfortable in the bed.  Stable complains of pain left leg.  No family present.  OBJECTIVE Most recent Vital Signs: Filed Vitals:   10/10/11 0600 10/10/11 0825 10/10/11 0953 10/10/11 1000  BP: 120/83   117/77  Pulse: 53 72  65  Temp: 97.6 F (36.4 C)   97.5 F (36.4 C)  TempSrc:    Oral  Resp: 16   16  Height:      Weight: 44.1 kg (97 lb 3.6 oz)     SpO2: 98%  100% 100%   CBG (last 3)  No results found for this basename: GLUCAP:3 in the last 72 hours Intake/Output from previous day: 08/18 0701 - 08/19 0700 In: 720 [P.O.:720] Out: 300 [Urine:300]  IV Fluid Intake:      . sodium chloride 20 mL/hr (10/06/11 2245)    MEDICATIONS     . aspirin  81 mg Oral Daily  . budesonide-formoterol  2 puff Inhalation BID  . carvedilol  3.125 mg Oral BID WC  . clopidogrel  75 mg Oral Daily  . docusate sodium  100 mg Oral BID  . famotidine  20 mg Oral BID  . furosemide  40 mg Oral Daily  . heparin  5,000 Units Subcutaneous Q8H  . OLANZapine  2.5 mg Oral QHS  . potassium chloride  10 mEq Oral Daily  . QUEtiapine  50 mg Oral QHS  . simvastatin  20 mg Oral QHS  . tiotropium  18 mcg Inhalation Daily  . traZODone  50 mg Oral QHS   PRN:  acetaminophen, levalbuterol, nitroGLYCERIN,  senna-docusate  Diet:  General thin liquids Activity:  Out of Bed DVT Prophylaxis:  Heparin 5000 units sq tid  CLINICALLY SIGNIFICANT STUDIES Basic Metabolic Panel:  Lab 10/09/11 1308 10/06/11 1726  NA 139 141  K 4.4 3.2*  CL 105 99  CO2 24 29  GLUCOSE 79 95  BUN 20 10  CREATININE 1.16* 1.01  CALCIUM 9.7 9.9  MG -- --  PHOS -- --   Liver Function Tests:  Lab 10/06/11 1726  AST 20  ALT 6  ALKPHOS 95  BILITOT 0.6  PROT 7.9  ALBUMIN 3.9   CBC:  Lab 10/09/11 0800 10/06/11 1726  WBC 3.5* 4.4  NEUTROABS -- 2.9  HGB 13.2 15.3*  HCT 40.2 45.9  MCV 92.0 90.4  PLT 172 206   CBG:  Lab 10/06/11 1723  GLUCAP 93   Hemoglobin A1C:  Lab 10/07/11 0650  HGBA1C 5.9*   Fasting Lipid Panel:  Lab 10/07/11 0650  CHOL 213*  HDL 100  LDLCALC 98  TRIG 73  CHOLHDL 2.1  LDLDIRECT --   Coagulation:  Lab 10/06/11 1825  LABPROT 13.0  INR 0.96    Urinalysis:  Lab 10/06/11 1725  COLORURINE YELLOW  LABSPEC 1.008  PHURINE 7.0  GLUCOSEU NEGATIVE  HGBUR NEGATIVE  BILIRUBINUR NEGATIVE  KETONESUR NEGATIVE  PROTEINUR NEGATIVE  UROBILINOGEN 0.2  NITRITE NEGATIVE  LEUKOCYTESUR NEGATIVE     10/06/2011  CHEST - 2 VIEW    Findings: Cardiomegaly.  COPD with hyperinflation.  No infiltrates or failure.  No effusion or pneumothorax.  Calcified tortuous aorta. Old left-sided rib fractures.  Similar appearance to priors.  IMPRESSION: Cardiomegaly with COPD.  No definite active infiltrates.   Elsie Stain, M.D.   10/06/2011  CT HEAD WITHOUT CONTRAST    Findings: There are extensive  changes of chronic brain substance loss with  old right frontal and left temporo-occipital infarctions noted on previous study. There has been interval development of a moderately large left frontal suprasylvian cortical and subcortical infarct since the prior exam, although this too appears chronic.  There is an area of hypoattenuation in the right parietal cortex and subcortical white matter (images 14  - 17, series 2) concerning for acute infarction.  No visible acute hemorrhage.  No midline shift or hydrocephalus.  No extra-axial fluid.  Intact calvarium. Clear sinuses and mastoids.  IMPRESSION: Findings concerning for acute right parietal cortical and subcortical infarction without hemorrhage.  Areas of chronic infarction as described.  Elsie Stain, M.D.   10/07/2011   MRI HEAD WITHOUT CONTRAST  Findings: Confluent right MCA posterior division territory infarct, mostly involving the posterior right temporal lobe and portions of the right occipital lobe (series 3 image 11).  Associated confluent gyral edema.  Mild to moderate mass effect on the right temporal horn.  Associated petechial hemorrhage (series 7 image 12).  Additional punctate focus of mildly restricted diffusion in the left MCA posterior division territory, abutting an area of chronic encephalomalacia.  Series 3 image 14.  No ventriculomegaly. Major intracranial vascular flow voids are stable since 2011. Chronic left PCA territory infarct with encephalomalacia. Occasional tiny chronic lacunar infarcts in the right cerebellum are new since 2011.  Right and ACA chronic infarct with encephalomalacia is new since 2011.  Occasional chronic lacunar infarcts in the deep gray matter nuclei are new.  No extra-axial collection.  Negative visualized pituitary, cervicomedullary junction, and cervical spine.  Normal bone marrow signal.  Postoperative changes to the globes. Visualized paranasal sinuses and mastoids are clear.  Negative scalp soft tissues.  IMPRESSION: 1.  Confluent acute right MCA (posterior division) infarct with petechial hemorrhage and mild mass effect. 2.  Chronic left MCA, right ACA, and left PCA infarcts, with progression of this ischemic disease since 2011.  H.LEE HALL III, M.D.   MRA HEAD WITHOUT CONTRAST   Findings: Antegrade flow in the posterior circulation with mildly dominant distal left vertebral artery.  Chronic distal  vertebral artery tortuosity is stable.  Irregularity of the distal most right V4 segment, beyond PICA origin, has increased.  Irregularity of the distal left vertebral artery is increased but there is no significant stenosis.  Normal left PICA.  Basilar artery remains patent without stenosis.  SCA and PCA origins are within normal limits.  Right posterior communicating artery is present, the left is diminutive or absent.  Bilateral PCA branches are stable.  Antegrade flow in both ICA siphons.  No ICA stenosis.  Carotid termini are patent.  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  The right ACA is dominant as before with no visible left A1 segment.  Normal anterior communicating artery.  Left ACA branches are within normal limits.  The right ACA branches now are  attenuated.  Left MCA M1 segment remains within normal limits. Left MCA branches are stable and within normal limits, infundibulum of the left lenticulostriate origin again noted.  On the right, the right MCA M1 segment is stable and within normal limits.  The right MCA bifurcation is patent and stable., the major right MCA M2 branches are stable and patent.  No proximal or major branch occlusion identified.  IMPRESSION: 1.  No major circle of Willis branch occlusion.  Major right MCA branches are stable since 2011. 2.  Interval attenuated flow in the right ACA branches. 3.  Increased atherosclerosis of the distal vertebral arteries, greater on the right and resulting in stenosis of the distal nondominant right vertebral.   H.LEE HALL III, M.D.   10/07/11 2D Echocardiogram  Left ventricle: The cavity size was mildly dilated. Systolic function was severely reduced. The estimated ejection fraction was in the range of 10% to 15%. Diffuse hypokinesis. Ventricular septum: Septal motion showed abnormal function and dyssynergy. Atrial septum: No defect or patent foramen ovale wasidentified. Impressions: No cardiac  source of embolism was identified, but cannot be ruled out on the basis of this examination.  10/07/11 Carotid Doppler  Technically difficult due to constant movement and respiratory interference. - Right - Mild wall changes of the CCA. moderate calcific plaque at the bifurcation. No obvious evidence of significant ICA stenosis. Vertebral artery flow is antegrade with loss of diastolic component. - Left - Mild wall changes in the CCA. Moderate to severe irregular heterogeneous plaque at the bifurcation with diminished velocities throughout the ICA. Vertebral artery flow is antegrade with diminished diastolic component.  10/06/2011 CXR  Cardiomegaly with COPD.  No definite active infiltrates.    EKG  normal sinus rhythm.   Therapy Recommendations PT HHPT    Mental Status: Alert, oriented to self, poor insight as to reason for hospitalization.  Cannot recall year or age, knows birth date.  Unable to repeat or name objects correctly.  Minimal dysarthria. Able to follow simple commands - 2 step commands. More attentive today attention.  Cranial Nerves: II-Patient able to count fingers on examiners hand correctly, unable to comment on other visual clues. III/IV/VI-Extraocular movements intact.  Pupils reactive bilaterally. V/VII-Smile symmetric, facial sensation intact. VIII-grossly intact IX/X-normal elevation uvula XI-bilateral shoulder shrug intact XII-midline tongue extension Motor: 5/5 bilaterally with normal tone and bulk Sensory: light touch intact throughout, bilaterally Deep Tendon Reflexes: 2+ and symmetric throughout;  Plantars: upgoing bilaterally. Cerebellar: Normal finger-to-nose bilaterally. Orbits right hand around left.  Physical Exam :  Lungs CTA anteriorly Cor RRR without murmer. Extremities without edema.pain left leg but no swelling noted.  ASSESSMENT [name] is a 66 y.o. female  With extensive past medical history including prior CVA and chronic left  sided weakness.  MRI head positive for confluent acute right MCA (posterior division) infarct with petechial hemorrhage and mild mass effect and chronic left MCA, right ACA, and left PCA infarcts, with progression of this ischemic disease since 2011.  Her presenting weakness and slurred speech is improved, however, true basel ine unknown.  Echo shows severe LVD with EF 10-15 %, no PFO or cardiac source of emboli.  LDL 98 A1C 5.9  - substance abuse - dementia - seizures - right brain stroke with resultant left hemiparesis 06/2011 - OSA - breast cancer - cachectic, Body mass index is 18.37 kg/(m^2).   Hospital day # 4  TREATMENT/PLAN Hold Imdur due to borderline low BPs. Further med adjustments to be directed  to Dr. Algie Coffer if needed.  -Decrease aspirin to  aspirin 81 mg orally every day along with plavix for secondary stroke prevention.  Patient not a candidate for coumadin anticoagulation or aggressive intervention due to severity of concomitant disease processes.   -Continue therapy.  -CIR consult. -LE venous doppler pending for DVT. Stroke team will sign off Call for questions. Delia Heady, MD Medical Director [facility] Pager: [phone]  10/10/2011 11:14 AM

## 2011-10-10 NOTE — Clinical Documentation Improvement (Signed)
CHF DOCUMENTATION CLARIFICATION QUERY  THIS DOCUMENT IS NOT A PERMANENT PART OF THE MEDICAL RECORD  Please update your documentation within the medical record to reflect your response to this query.                                                                                    10/10/11  Dear Dr.Harwani,  In a better effort to capture your patient's severity of illness, reflect appropriate length of stay and utilization of resources, a review of the patient medical record has revealed the following indicators the diagnosis of Heart Failure.   Based on your clinical judgment, please clarify and document in a progress note and/or discharge summary the clinical condition associated with the following supporting information: In responding to this query please exercise your independent judgment.  The fact that a query is asked, does not imply that any particular answer is desired or expected.   This patient has "CHF" documented in the History. If possible could you please help provide greater specificity for this patient in the progress note and discharge summary. THANK YOU!  [facility]: The acuity and type of CHF should be documented when known [acute, chronic, acute on chronic, systolic, diastolic, systolic and diastolic].  Possible Clinical Conditions?  - Chronic Diastolic Congestive Heart Failure  - Chronic Systolic Congestive Heart Failure  - Other condition (please document in the progress notes and/or discharge summary)  - Cannot Clinically determine at this time    Supporting Information:  - 2DEcho 8/16 Study Conclusions - Left ventricle: The cavity size was mildly dilated. Systolic function was severely reduced. The estimated ejection fraction was in the range of 10% to 15%. Diffuse hypokinesis. - Ventricular septum: Septal motion showed abnormal function and dyssynergy. - Atrial septum: No defect or patent foramen ovale was identified. - Pericardium, extracardiac: A trivial  pericardial effusion was identified anterior to the heart. Features were not consistent with tamponade physiology. Impressions: - No cardiac source of embolism was identified, but cannot be ruled out on the basis of this examination. Recommendations: Consider transesophageal echocardiography if clinically indicated.   - furosemide (LASIX) tablet 40 mg daily    No additional documentation in chart upon review. SM    Thank You,  Saul Fordyce  Clinical Documentation Specialist: [phone] Pager  Health Information Management [place]

## 2011-10-10 NOTE — Progress Notes (Signed)
VASCULAR LAB PRELIMINARY  PRELIMINARY  PRELIMINARY  PRELIMINARY  Left lower extremity venous duplex completed.    Preliminary report:  Left:  No evidence of DVT, superficial thrombosis, or Baker's cyst.  Twain Stenseth, RVT 10/10/2011, 11:50 AM

## 2011-10-10 NOTE — Clinical Social Work Note (Signed)
CSW received a consult for "other psychosocial issues" due to pt needing assistance with ADLs. PT is recommending home health PT, however CIR is evaluating pt for possible admission which pt is agreeable. CSW spoke with CIR admissions coordinator, who plans to evaluate pt for possible admission. CSW will continue to follow to assess discharge plans if CIR is not an option.   Dede Query, MSW, Theresia Majors [phone]

## 2011-10-10 NOTE — Consult Note (Signed)
Physical Medicine and Rehabilitation Consult Reason for Consult: CVA Referring Physician: Dr. Pearlean Brownie   HPI: [name] is a 66 y.o. right-handed female multi-medical with history of coronary artery disease a myocardial infarction secondary to cocaine abuse, nonischemic dilated cardiomyopathy, hypertension as well as congestive heart failure and history of stroke with left hemiparesthesias. Admitted 10/06/2011 with altered mental status and slurred speech. MRI of the brain showed acute right MCA posterior division infarct with petechial hemorrhage and mild mass effect. Also chronic left MCA, right ACA and left PCA infarcts. MRA of the head with no stenosis. Echocardiogram with ejection fraction of 15% with diffuse hypokinesis no cardiac source of embolism. Carotid Dopplers with no obvious evidence of significant ICA stenosis. Patient did not receive TPA. Maintained on aspirin and Plavix therapy for CVA prophylaxis. Subcutaneous heparin added for DVT prophylaxis. M.D. is requested physical medicine rehabilitation consult to consider inpatient rehabilitation services   Review of Systems  Gastrointestinal: Positive for constipation.  Musculoskeletal: Positive for myalgias.  Neurological: Positive for weakness and headaches.  Psychiatric/Behavioral: Positive for memory loss. The patient has insomnia.   All other systems reviewed and are negative.   Past Medical History  Diagnosis Date  . Weight loss, unintentional   . Trouble swallowing   . Change in voice   . Substance abuse   . Dementia   . Asthma   . Shortness of breath   . Seizures     "    It has been along time "  . CHF (congestive heart failure)   . Headache   . Hypertension   . Arthralgia   . Tremors of nervous system   . Anxiety   . Rib fractures     hx of May 2012  . Heart attack 05/21/10  . Schizophrenia   . Coronary artery disease   . Stroke 07/11/10    hx of R CVA   . COPD (chronic obstructive pulmonary disease)     . Obstructive sleep apnea   . Breast cancer 01/28/11    L breast, inv ductal/in situ, ER/PR -, Her2 -   Past Surgical History  Procedure Date  . Total abdominal hysterectomy age 28   Family History  Problem Relation Age of Onset  . Breast cancer Mother   . Birth defects Mother     breast  . Cancer Mother     breast  . Heart disease Father     heart attack  . Heart attack Father   . Heart attack Brother   . Cancer Brother     throat, lung  . Cancer Paternal Aunt   . Cancer Maternal Grandmother     breast    Social History:  reports that she quit smoking about 3 years ago. Her smoking use included Cigarettes. She has a 50 pack-year smoking history. She has never used smokeless tobacco. She reports that she drinks alcohol. She reports that she uses illicit drugs. Allergies:  Allergies  Allergen Reactions  . Codeine Itching    All over the body  . Penicillins Hives and Other (See Comments)    "Whelps" per patient.  . Hydralazine     02/28/11 Family unsure if this is allergy for pt   Medications Prior to Admission  Medication Sig Dispense Refill  . albuterol (PROVENTIL) 90 MCG/ACT inhaler Inhale 2 puffs into the lungs every 6 (six) hours as needed. For wheezing       . budesonide-formoterol (SYMBICORT) 80-4.5 MCG/ACT inhaler Inhale 2 puffs into  the lungs 2 (two) times daily.      . carvedilol (COREG) 3.125 MG tablet Take 3.125 mg by mouth 2 (two) times daily with a meal.        . clopidogrel (PLAVIX) 75 MG tablet Take 75 mg by mouth daily.        . furosemide (LASIX) 40 MG tablet Take 40 mg by mouth daily.        . nitroGLYCERIN (NITROSTAT) 0.4 MG SL tablet Place 0.4 mg under the tongue every 5 (five) minutes as needed. For chest pain      . omeprazole (PRILOSEC) 20 MG capsule Take 20 mg by mouth daily.          Home: Home Living Lives With: Alone Available Help at Discharge: Family Type of Home: Apartment Home Access: Stairs to enter Secretary/administrator of Steps:  1 Home Layout: One level Bathroom Shower/Tub: Engineer, manufacturing systems: Standard Home Adaptive Equipment: Walker - rolling;Straight cane;Shower chair with back;Bedside commode/3-in-1 Additional Comments: attempted to call both sisters. No contact.  Functional History: Prior Function Meal Prep: Maximal Light Housekeeping: Moderate Able to Take Stairs?: Yes Driving: No Functional Status:  Mobility: Bed Mobility Bed Mobility: Supine to Sit Left Sidelying to Sit: 4: Min assist;HOB flat;With rails Supine to Sit: 4: Min assist Sitting - Scoot to Edge of Bed: 5: Supervision Sit to Supine: 5: Supervision;HOB flat Transfers Transfers: Sit to Stand;Stand to Sit Sit to Stand: 4: Min assist Stand to Sit: 4: Min assist;With upper extremity assist Ambulation/Gait Ambulation/Gait Assistance: 4: Min assist Ambulation Distance (Feet): 15 Feet Assistive device: Rolling walker Ambulation/Gait Assistance Details: Pt walking on toes of Lt foot (able to put foot flat with cues, yet complained of soreness); gait appears antalgic Gait Pattern: Step-to pattern;Decreased step length - left;Left foot flat;Right foot flat    ADL: ADL Eating/Feeding: Simulated;Set up Where Assessed - Eating/Feeding: Edge of bed Grooming: Performed;Supervision/safety Where Assessed - Grooming: Supported standing Upper Body Bathing: Simulated;Supervision/safety Where Assessed - Upper Body Bathing: Unsupported sitting Lower Body Bathing: Simulated;Minimal assistance Where Assessed - Lower Body Bathing: Supported sit to stand Upper Body Dressing: Simulated;Minimal assistance Where Assessed - Upper Body Dressing: Unsupported sitting Lower Body Dressing: Simulated;Minimal assistance Where Assessed - Lower Body Dressing: Supported sit to Pharmacist, hospital: Performed;Minimal assistance Toilet Transfer Method: Sit to Barista: Comfort height toilet Transfers/Ambulation Related to ADLs:  Min A. Abmormal gait pattern from previous CVA. Pt states LLE is worse. ADL Comments: A for safety. Difficulty with spatial relations.  Cognition: Cognition Overall Cognitive Status: Impaired Arousal/Alertness: Lethargic Orientation Level: Oriented to person;Oriented to place Attention: Focused;Sustained Focused Attention: Appears intact Sustained Attention: Impaired Sustained Attention Impairment: Verbal basic;Functional basic Memory: Impaired Memory Impairment: Storage deficit;Retrieval deficit;Decreased recall of new information;Decreased short term memory;Decreased long term memory Decreased Short Term Memory: Verbal basic;Functional basic Awareness: Impaired Awareness Impairment: Intellectual impairment (minimally improved by end of evaluation) Problem Solving: Impaired Problem Solving Impairment: Verbal basic;Functional basic Safety/Judgment: Impaired Cognition Overall Cognitive Status: Impaired Area of Impairment: Attention;Memory;Safety/judgement;Awareness of deficits;Problem solving;Executive functioning Arousal/Alertness: Lethargic Orientation Level: Disoriented to;Time Behavior During Session: Anxious Current Attention Level: Sustained Memory: Decreased recall of precautions Memory Deficits: decrased STM Safety/Judgement: Decreased safety judgement for tasks assessed Problem Solving: extended time for problem solving Executive Functioning: poor self regulatory behavior  Blood pressure 120/83, pulse 53, temperature 97.6 F (36.4 C), temperature source Oral, resp. rate 16, height 5\' 1"  (1.549 m), weight 44.1 kg (97 lb 3.6 oz), SpO2 98.00%. Physical Exam  Constitutional:       frail female appearing older than stated age.  HENT:  Head: Normocephalic.  Eyes:       Pupils reactive to light  Neck: No thyromegaly present.  Cardiovascular: Normal rate and regular rhythm.   Pulmonary/Chest: Breath sounds normal. No respiratory distress. She has no wheezes.  Abdominal:  Bowel sounds are normal. She exhibits no distension. There is no tenderness.  Musculoskeletal: She exhibits no edema.  Neurological: She is alert.       Patient names person place and age. Flat affect. She follows simple to three-step commands. She could name familiar objects. Left upper ext 4. LLE is 2-3/5.  No gross sensory deficits. Mild left facial droop. Speech is of low volume and slightly slurred.  Psychiatric: Her affect is blunt. She is slowed.    Results for orders placed during the hospital encounter of 10/06/11 (from the past 24 hour(s))  CBC     Status: Abnormal   Collection Time   10/09/11  8:00 AM      Component Value Range   WBC 3.5 (*) 4.0 - 10.5 K/uL   RBC 4.37  3.87 - 5.11 MIL/uL   Hemoglobin 13.2  12.0 - 15.0 g/dL   HCT 16.1  09.6 - 04.5 %   MCV 92.0  78.0 - 100.0 fL   MCH 30.2  26.0 - 34.0 pg   MCHC 32.8  30.0 - 36.0 g/dL   RDW 40.9  81.1 - 91.4 %   Platelets 172  150 - 400 K/uL  BASIC METABOLIC PANEL     Status: Abnormal   Collection Time   10/09/11  8:00 AM      Component Value Range   Sodium 139  135 - 145 mEq/L   Potassium 4.4  3.5 - 5.1 mEq/L   Chloride 105  96 - 112 mEq/L   CO2 24  19 - 32 mEq/L   Glucose, Bld 79  70 - 99 mg/dL   BUN 20  6 - 23 mg/dL   Creatinine, Ser 7.82 (*) 0.50 - 1.10 mg/dL   Calcium 9.7  8.4 - 95.6 mg/dL   GFR calc non Af Amer 48 (*) >90 mL/min   GFR calc Af Amer 56 (*) >90 mL/min   Ct Head Wo Contrast  10/09/2011  *RADIOLOGY REPORT*  Clinical Data: New onset confusion.  Headache.  CT HEAD WITHOUT CONTRAST  Technique:  Contiguous axial images were obtained from the base of the skull through the vertex without contrast.  Comparison: 10/06/2011 CT head.  MRI head 10/07/2011.  Findings: Diffuse cerebral atrophy.  Ventricular dilatation consistent with central atrophy.  Low attenuation change in the deep white matter consistent with small vessel ischemia.  Old areas of encephalomalacia in the right anterior frontal lobe, left posterior  frontal lobe, and left occipital lobe.  Subacute infarct in the right posterior parietal/occipital region as demonstrated on previous CT and MRI.  No significant progression.  No evidence of acute intracranial hemorrhage.  No mass effect or midline shift. No abnormal extra-axial fluid collections.  Gray-white matter junctions are distinct.  Basal cisterns are not effaced.  No depressed skull fractures.  Visualized paranasal sinuses and mastoid air cells are not opacified.  Vascular calcifications.  IMPRESSION: Stable appearance of multiple cortical infarcts and diffuse atrophy since previous study.  No acute hemorrhage.  Original Report Authenticated By: Marlon Pel, M.D.    Assessment/Plan: Diagnosis: right MCA infarct 1. Does the need for close, 24 hr/day medical  supervision in concert with the patient's rehab needs make it unreasonable for this patient to be served in a less intensive setting? Yes 2. Co-Morbidities requiring supervision/potential complications: schizophrenia, CAD, CHF, copd 3. Due to bladder management, bowel management, safety, skin/wound care, disease management, medication administration, pain management and patient education, does the patient require 24 hr/day rehab nursing? Yes 4. Does the patient require coordinated care of a physician, rehab nurse, PT (1-2 hrs/day, 5 days/week), OT (1-2 hrs/day, 5 days/week) and SLP (1-2 hrs/day, 5 days/week) to address physical and functional deficits in the context of the above medical diagnosis(es)? Yes Addressing deficits in the following areas: balance, endurance, locomotion, strength, transferring, bowel/bladder control, bathing, dressing, feeding, grooming, toileting, cognition, speech, language, swallowing and psychosocial support 5. Can the patient actively participate in an intensive therapy program of at least 3 hrs of therapy per day at least 5 days per week? Yes 6. The potential for patient to make measurable gains while on  inpatient rehab is excellent 7. Anticipated functional outcomes upon discharge from inpatient rehab are supervision to mod I with PT, supervision to set up with OT, supervision with SLP. 8. Estimated rehab length of stay to reach the above functional goals is: 10 days 9. Does the patient have adequate social supports to accommodate these discharge functional goals? Yes and Potentially 10. Anticipated D/C setting: Home 11. Anticipated post D/C treatments: HH therapy 12. Overall Rehab/Functional Prognosis: excellent  RECOMMENDATIONS: This patient's condition is appropriate for continued rehabilitative care in the following setting: CIR Patient has agreed to participate in recommended program. Yes Note that insurance prior authorization may be required for reimbursement for recommended care.  Comment: Rehab RN to follow up regarding social supports.   Ivory Broad, MD     10/10/2011

## 2011-10-10 NOTE — Progress Notes (Signed)
Physical Therapy Treatment Patient Details Name: [name] MRN: [id number] DOB: [birth year] Today's Date: 10/10/2011 Time: 7829-5621 PT Time Calculation (min): 26 min  PT Assessment / Plan / Recommendation Comments on Treatment Session  Patient ambulating today however complained of increased L LE pain. Performed Homans sign testing and patient with positive result. RN was made aware and patient was return to recliner. Patient progressing with ambulation. Continue with current POC    Follow Up Recommendations  Home health PT;Supervision/Assistance - 24 hour    Barriers to Discharge        Equipment Recommendations  None recommended by OT    Recommendations for Other Services    Frequency Min 4X/week   Plan Discharge plan remains appropriate;Frequency remains appropriate    Precautions / Restrictions Precautions Precautions: Fall Restrictions Other Position/Activity Restrictions: Lt inattention vs Lt visual field cut   Pertinent Vitals/Pain Patient with complaints of L LE pain    Mobility  Bed Mobility Bed Mobility: Supine to Sit Left Sidelying to Sit: 4: Min assist Sitting - Scoot to Edge of Bed: 5: Supervision Details for Bed Mobility Assistance: Cues for safety and positioning  Transfers Sit to Stand: 4: Min assist;From bed;With upper extremity assist Stand to Sit: With armrests;With upper extremity assist;To chair/3-in-1 Details for Transfer Assistance: Cues for safe technique. A for safety and to ensure balance as patient with slight posterior lean Ambulation/Gait Ambulation/Gait Assistance: 4: Min assist Ambulation Distance (Feet): 80 Feet Assistive device: Rolling walker Ambulation/Gait Assistance Details: Patient complained of soreness on LLE and not completely putting heel on the ground. The more patient ambulated the more she was able tolerate foot on floor Gait Pattern: Step-through pattern;Decreased stride length;Trunk flexed    Exercises     PT  Diagnosis:    PT Problem List:   PT Treatment Interventions:     PT Goals Acute Rehab PT Goals PT Goal: Supine/Side to Sit - Progress: Progressing toward goal PT Goal: Sit to Stand - Progress: Progressing toward goal PT Goal: Ambulate - Progress: Progressing toward goal Additional Goals PT Goal: Additional Goal #1 - Progress: Progressing toward goal  Visit Information  Last PT Received On: 10/10/11 Assistance Needed: +1    Subjective Data  Subjective: "My leg hurts so bad"   Cognition  Overall Cognitive Status: Impaired Area of Impairment: Attention;Memory;Safety/judgement;Awareness of deficits;Problem solving Arousal/Alertness: Awake/alert Orientation Level: Disoriented X4;Time Behavior During Session: WFL for tasks performed Current Attention Level: Sustained Safety/Judgement: Decreased safety judgement for tasks assessed Problem Solving: extended time for problem solving    Balance  Balance Balance Assessed: Yes Static Sitting Balance Static Sitting - Balance Support: No upper extremity supported;Feet supported Static Sitting - Level of Assistance: 7: Independent Static Standing Balance Static Standing - Level of Assistance: 4: Min assist  End of Session PT - End of Session Equipment Utilized During Treatment: Gait belt Activity Tolerance: Patient tolerated treatment well Patient left: in bed;with bed alarm set;with call bell/phone within reach Nurse Communication: Mobility status;Other (comment) (notified RN re: patient positive Homans sign)   GP     Fredrich Birks 10/10/2011, 1:58 PM 10/10/2011 Fredrich Birks PTA [phone] pager [phone] office

## 2011-10-10 NOTE — Progress Notes (Signed)
Subjective:  Patient denies any chest pain or shortness of breath. Left leg pain improved also left leg weakness and slurred speech improved  Objective:  Vital Signs in the last 24 hours: Temp:  [97.5 F (36.4 C)-97.8 F (36.6 C)] 97.8 F (36.6 C) (08/19 1400) Pulse Rate:  [53-72] 62  (08/19 1400) Resp:  [14-20] 17  (08/19 1400) BP: (94-120)/(58-83) 94/58 mmHg (08/19 1400) SpO2:  [96 %-100 %] 100 % (08/19 1400) Weight:  [44.1 kg (97 lb 3.6 oz)] 44.1 kg (97 lb 3.6 oz) (08/19 0600)  Intake/Output from previous day: 08/18 0701 - 08/19 0700 In: 720 [P.O.:720] Out: 300 [Urine:300] Intake/Output from this shift:    Physical Exam: Neck: no adenopathy, no carotid bruit, no JVD and supple, symmetrical, trachea midline Lungs: Decreased breaths on at bases Heart: regularly irregular rhythm, S1, S2 normal and Soft systolic murmur noted Abdomen: soft, non-tender; bowel sounds normal; no masses,  no organomegaly Extremities: extremities normal, atraumatic, no cyanosis or edema  Lab Results:  Basename 10/09/11 0800  WBC 3.5*  HGB 13.2  PLT 172    Basename 10/09/11 0800  NA 139  K 4.4  CL 105  CO2 24  GLUCOSE 79  BUN 20  CREATININE 1.16*   No results found for this basename: TROPONINI:2,CK,MB:2 in the last 72 hours Hepatic Function Panel No results found for this basename: PROT,ALBUMIN,AST,ALT,ALKPHOS,BILITOT,BILIDIR,IBILI in the last 72 hours No results found for this basename: CHOL in the last 72 hours No results found for this basename: PROTIME in the last 72 hours  Imaging: Imaging results have been reviewed and Ct Head Wo Contrast  10/09/2011  *RADIOLOGY REPORT*  Clinical Data: New onset confusion.  Headache.  CT HEAD WITHOUT CONTRAST  Technique:  Contiguous axial images were obtained from the base of the skull through the vertex without contrast.  Comparison: 10/06/2011 CT head.  MRI head 10/07/2011.  Findings: Diffuse cerebral atrophy.  Ventricular dilatation consistent  with central atrophy.  Low attenuation change in the deep white matter consistent with small vessel ischemia.  Old areas of encephalomalacia in the right anterior frontal lobe, left posterior frontal lobe, and left occipital lobe.  Subacute infarct in the right posterior parietal/occipital region as demonstrated on previous CT and MRI.  No significant progression.  No evidence of acute intracranial hemorrhage.  No mass effect or midline shift. No abnormal extra-axial fluid collections.  Gray-white matter junctions are distinct.  Basal cisterns are not effaced.  No depressed skull fractures.  Visualized paranasal sinuses and mastoid air cells are not opacified.  Vascular calcifications.  IMPRESSION: Stable appearance of multiple cortical infarcts and diffuse atrophy since previous study.  No acute hemorrhage.  Original Report Authenticated By: Marlon Pel, M.D.    Cardiac Studies:  Assessment/Plan:  Acute right parietal cortical and subcortical infarct rule out cardioembolic CVA  History of CVA in past  Nonischemic dilated cardiomyopathy  History of non-Q-wave MI secondary to vasospasm secondary to cocaine abuse in past  Hypertension  COPD  Tobacco abuse  Hypercholesteremia  History of dementia  History of schizophrenia  History of mild renal insufficiency in the past  History of CVA of breast sign  History of polysubstance abuse  History of cocaine abuse in the past  Plan Continue present management Awaiting for inpatient rehabilitation transfer  LOS: 4 days    Lucciano Vitali N 10/10/2011, 5:16 PM

## 2011-10-10 NOTE — Progress Notes (Signed)
Occupational Therapy Treatment Patient Details Name: [name] MRN: [id number] DOB: 1945/09/19 Today's Date: 10/10/2011 Time: 4098-1191 OT Time Calculation (min): 27 min  OT Assessment / Plan / Recommendation Comments on Treatment Session Pt required minimal encouragement to participate in therapy.  Somewhat perseverative with hand washing and getting all the soap off.  Difficulty with problem solving and spatial relationships with ADL.     Follow Up Recommendations  Inpatient Rehab    Barriers to Discharge       Equipment Recommendations       Recommendations for Other Services Rehab consult  Frequency Min 3X/week   Plan Discharge plan remains appropriate    Precautions / Restrictions Precautions Precautions: Fall Restrictions Other Position/Activity Restrictions: Lt inattention vs Lt visual field cut   Pertinent Vitals/Pain Pain behind L knee/Leg, RN aware    ADL  Eating/Feeding: Performed;Set up Where Assessed - Eating/Feeding: Chair Grooming: Performed;Wash/dry hands;Minimal assistance Where Assessed - Grooming: Supported standing Upper Body Dressing: Performed;Minimal assistance Where Assessed - Upper Body Dressing: Unsupported sitting Lower Body Dressing: Performed;Set up Where Assessed - Lower Body Dressing: Unsupported sitting Equipment Used: Gait belt Transfers/Ambulation Related to ADLs: min assist with RW    OT Diagnosis:    OT Problem List:   OT Treatment Interventions:     OT Goals ADL Goals Pt Will Perform Upper Body Dressing: with modified independence;Sitting, chair;Unsupported ADL Goal: Upper Body Dressing - Progress: Progressing toward goals Pt Will Perform Lower Body Dressing: with supervision;with caregiver independent in assisting;Unsupported ADL Goal: Lower Body Dressing - Progress: Progressing toward goals Pt Will Transfer to Toilet: with supervision;with caregiver independent in assisting;Ambulation ADL Goal: Toilet Transfer -  Progress: Progressing toward goals  Visit Information  Last OT Received On: 10/10/11 Assistance Needed: +1 PT/OT Co-Evaluation/Treatment: Yes    Subjective Data      Prior Functioning       Cognition  Overall Cognitive Status: Impaired Area of Impairment: Attention;Memory;Safety/judgement;Awareness of deficits;Problem solving;Executive functioning Arousal/Alertness: Awake/alert Orientation Level: Disoriented to;Time Behavior During Session: WFL for tasks performed Current Attention Level: Sustained Safety/Judgement: Decreased safety judgement for tasks assessed Problem Solving: extended time for problem solving    Mobility Bed Mobility Bed Mobility: Supine to Sit Left Sidelying to Sit: 4: Min assist;With rails Sitting - Scoot to Edge of Bed: 5: Supervision Transfers Transfers: Sit to Stand;Stand to Sit Sit to Stand: 4: Min assist;From bed;With upper extremity assist Stand to Sit: 4: Min assist;With upper extremity assist;To chair/3-in-1   Exercises    Balance Balance Balance Assessed: Yes Static Sitting Balance Static Sitting - Balance Support: No upper extremity supported;Feet supported Static Sitting - Level of Assistance: 7: Independent Static Standing Balance Static Standing - Level of Assistance: 4: Min assist  End of Session OT - End of Session Activity Tolerance: Patient limited by pain Patient left: in chair;with call bell/phone within reach Nurse Communication: Other (comment) (pain in L LE behind knee)  GO     [name] 10/10/2011, 11:00 AM [phone]

## 2011-10-11 ENCOUNTER — Inpatient Hospital Stay (HOSPITAL_COMMUNITY)
Admission: RE | Admit: 2011-10-11 | Discharge: 2011-10-22 | DRG: 945 | Disposition: A | Discharge status: Home Health | Payer: Medicare Other | Source: Ambulatory Visit | Attending: Physical Medicine & Rehabilitation | Admitting: Physical Medicine & Rehabilitation

## 2011-10-11 ENCOUNTER — Encounter (HOSPITAL_COMMUNITY): Payer: Self-pay | Admitting: *Deleted

## 2011-10-11 DIAGNOSIS — Z7902 Long term (current) use of antithrombotics/antiplatelets: Secondary | ICD-10-CM

## 2011-10-11 DIAGNOSIS — F0390 Unspecified dementia without behavioral disturbance: Secondary | ICD-10-CM

## 2011-10-11 DIAGNOSIS — K5900 Constipation, unspecified: Secondary | ICD-10-CM

## 2011-10-11 DIAGNOSIS — Z8673 Personal history of transient ischemic attack (TIA), and cerebral infarction without residual deficits: Secondary | ICD-10-CM

## 2011-10-11 DIAGNOSIS — I6330 Cerebral infarction due to thrombosis of unspecified cerebral artery: Secondary | ICD-10-CM

## 2011-10-11 DIAGNOSIS — E785 Hyperlipidemia, unspecified: Secondary | ICD-10-CM

## 2011-10-11 DIAGNOSIS — F411 Generalized anxiety disorder: Secondary | ICD-10-CM

## 2011-10-11 DIAGNOSIS — G4733 Obstructive sleep apnea (adult) (pediatric): Secondary | ICD-10-CM

## 2011-10-11 DIAGNOSIS — I639 Cerebral infarction, unspecified: Secondary | ICD-10-CM

## 2011-10-11 DIAGNOSIS — R413 Other amnesia: Secondary | ICD-10-CM

## 2011-10-11 DIAGNOSIS — Z5189 Encounter for other specified aftercare: Principal | ICD-10-CM

## 2011-10-11 DIAGNOSIS — I10 Essential (primary) hypertension: Secondary | ICD-10-CM

## 2011-10-11 DIAGNOSIS — Z853 Personal history of malignant neoplasm of breast: Secondary | ICD-10-CM

## 2011-10-11 DIAGNOSIS — J449 Chronic obstructive pulmonary disease, unspecified: Secondary | ICD-10-CM

## 2011-10-11 DIAGNOSIS — I619 Nontraumatic intracerebral hemorrhage, unspecified: Secondary | ICD-10-CM

## 2011-10-11 DIAGNOSIS — I428 Other cardiomyopathies: Secondary | ICD-10-CM

## 2011-10-11 DIAGNOSIS — G4700 Insomnia, unspecified: Secondary | ICD-10-CM

## 2011-10-11 DIAGNOSIS — I252 Old myocardial infarction: Secondary | ICD-10-CM

## 2011-10-11 DIAGNOSIS — I509 Heart failure, unspecified: Secondary | ICD-10-CM

## 2011-10-11 DIAGNOSIS — J4489 Other specified chronic obstructive pulmonary disease: Secondary | ICD-10-CM

## 2011-10-11 DIAGNOSIS — F209 Schizophrenia, unspecified: Secondary | ICD-10-CM

## 2011-10-11 DIAGNOSIS — K219 Gastro-esophageal reflux disease without esophagitis: Secondary | ICD-10-CM

## 2011-10-11 DIAGNOSIS — R29810 Facial weakness: Secondary | ICD-10-CM

## 2011-10-11 DIAGNOSIS — I5022 Chronic systolic (congestive) heart failure: Secondary | ICD-10-CM

## 2011-10-11 DIAGNOSIS — I2510 Atherosclerotic heart disease of native coronary artery without angina pectoris: Secondary | ICD-10-CM

## 2011-10-11 DIAGNOSIS — Z87891 Personal history of nicotine dependence: Secondary | ICD-10-CM

## 2011-10-11 DIAGNOSIS — R4789 Other speech disturbances: Secondary | ICD-10-CM

## 2011-10-11 DIAGNOSIS — M25559 Pain in unspecified hip: Secondary | ICD-10-CM

## 2011-10-11 DIAGNOSIS — Z79899 Other long term (current) drug therapy: Secondary | ICD-10-CM

## 2011-10-11 MED ORDER — ENSURE COMPLETE PO LIQD
237.0000 mL | Freq: Two times a day (BID) | ORAL | Status: DC
  Administered 2011-10-11 – 2011-10-22 (×18): 237 mL via ORAL

## 2011-10-11 MED ORDER — FAMOTIDINE 20 MG PO TABS: 20.0000 mg | ORAL_TABLET | Freq: Two times a day (BID) | ORAL | Status: DC

## 2011-10-11 MED ORDER — POLYETHYLENE GLYCOL 3350 17 G PO PACK
17.0000 g | PACK | Freq: Every day | ORAL | Status: DC | PRN
  Filled 2011-10-11: qty 1

## 2011-10-11 MED ORDER — NITROGLYCERIN 0.4 MG SL SUBL: 0.4000 mg | SUBLINGUAL_TABLET | SUBLINGUAL | Status: DC | PRN

## 2011-10-11 MED ORDER — CARVEDILOL 3.125 MG PO TABS
3.1250 mg | ORAL_TABLET | Freq: Two times a day (BID) | ORAL | Status: DC
  Administered 2011-10-11 – 2011-10-22 (×22): 3.125 mg via ORAL
  Filled 2011-10-11 (×26): qty 1

## 2011-10-11 MED ORDER — SIMVASTATIN 20 MG PO TABS
20.0000 mg | ORAL_TABLET | Freq: Every day | ORAL | Status: DC
  Administered 2011-10-11 – 2011-10-21 (×11): 20 mg via ORAL
  Filled 2011-10-11 (×12): qty 1

## 2011-10-11 MED ORDER — BUDESONIDE-FORMOTEROL FUMARATE 80-4.5 MCG/ACT IN AERO: 2.0000 | INHALATION_SPRAY | Freq: Two times a day (BID) | RESPIRATORY_TRACT | Status: DC

## 2011-10-11 MED ORDER — CLOPIDOGREL BISULFATE 75 MG PO TABS
75.0000 mg | ORAL_TABLET | Freq: Every day | ORAL | Status: DC
  Administered 2011-10-11 – 2011-10-22 (×12): 75 mg via ORAL
  Filled 2011-10-11 (×15): qty 1

## 2011-10-11 MED ORDER — FUROSEMIDE 40 MG PO TABS
40.0000 mg | ORAL_TABLET | Freq: Every day | ORAL | Status: DC
  Filled 2011-10-11 (×2): qty 1

## 2011-10-11 MED ORDER — TIOTROPIUM BROMIDE MONOHYDRATE 18 MCG IN CAPS
18.0000 ug | ORAL_CAPSULE | Freq: Every day | RESPIRATORY_TRACT | Status: DC
  Administered 2011-10-14 – 2011-10-22 (×8): 18 ug via RESPIRATORY_TRACT
  Filled 2011-10-11 (×3): qty 5

## 2011-10-11 MED ORDER — SORBITOL 70 % SOLN
30.0000 mL | Freq: Every day | Status: DC | PRN
  Administered 2011-10-14 – 2011-10-17 (×3): 30 mL via ORAL
  Filled 2011-10-11 (×3): qty 30

## 2011-10-11 MED ORDER — ONDANSETRON HCL 4 MG PO TABS: 4.0000 mg | ORAL_TABLET | Freq: Four times a day (QID) | ORAL | Status: DC | PRN

## 2011-10-11 MED ORDER — FAMOTIDINE 20 MG PO TABS
20.0000 mg | ORAL_TABLET | Freq: Two times a day (BID) | ORAL | Status: DC
  Filled 2011-10-11 (×2): qty 1

## 2011-10-11 MED ORDER — ACETAMINOPHEN 325 MG PO TABS
325.0000 mg | ORAL_TABLET | ORAL | Status: DC | PRN
  Administered 2011-10-12 – 2011-10-22 (×11): 650 mg via ORAL
  Filled 2011-10-11 (×11): qty 2

## 2011-10-11 MED ORDER — ONDANSETRON HCL 4 MG/2ML IJ SOLN: 4.0000 mg | Freq: Four times a day (QID) | INTRAMUSCULAR | Status: DC | PRN

## 2011-10-11 MED ORDER — TIOTROPIUM BROMIDE MONOHYDRATE 18 MCG IN CAPS: 18.0000 ug | ORAL_CAPSULE | Freq: Every day | RESPIRATORY_TRACT | Status: AC

## 2011-10-11 MED ORDER — LEVALBUTEROL HCL 1.25 MG/0.5ML IN NEBU
1.2500 mg | INHALATION_SOLUTION | Freq: Four times a day (QID) | RESPIRATORY_TRACT | Status: DC | PRN
  Filled 2011-10-11: qty 0.5

## 2011-10-11 MED ORDER — ASPIRIN 81 MG PO TABS: 81.0000 mg | ORAL_TABLET | Freq: Every day | ORAL | Status: DC

## 2011-10-11 MED ORDER — FAMOTIDINE 20 MG PO TABS
20.0000 mg | ORAL_TABLET | Freq: Two times a day (BID) | ORAL | Status: DC
  Administered 2011-10-11 – 2011-10-19 (×16): 20 mg via ORAL
  Filled 2011-10-11 (×20): qty 1

## 2011-10-11 MED ORDER — QUETIAPINE FUMARATE 50 MG PO TABS
50.0000 mg | ORAL_TABLET | Freq: Every day | ORAL | Status: DC
  Administered 2011-10-11 – 2011-10-21 (×11): 50 mg via ORAL
  Filled 2011-10-11 (×13): qty 1

## 2011-10-11 MED ORDER — POTASSIUM CHLORIDE CRYS ER 10 MEQ PO TBCR: 10.0000 meq | EXTENDED_RELEASE_TABLET | Freq: Every day | ORAL | Status: DC

## 2011-10-11 MED ORDER — SIMVASTATIN 20 MG PO TABS: 20.0000 mg | ORAL_TABLET | Freq: Every day | ORAL | Status: AC

## 2011-10-11 MED ORDER — OLANZAPINE 2.5 MG PO TABS: 2.5000 mg | ORAL_TABLET | Freq: Every day | ORAL | Status: DC

## 2011-10-11 MED ORDER — SENNOSIDES-DOCUSATE SODIUM 8.6-50 MG PO TABS: 1.0000 | ORAL_TABLET | Freq: Every evening | ORAL | Status: DC | PRN

## 2011-10-11 MED ORDER — ASPIRIN 81 MG PO CHEW
81.0000 mg | CHEWABLE_TABLET | Freq: Every day | ORAL | Status: DC
  Administered 2011-10-12 – 2011-10-22 (×11): 81 mg via ORAL
  Filled 2011-10-11 (×11): qty 1

## 2011-10-11 MED ORDER — BUDESONIDE-FORMOTEROL FUMARATE 80-4.5 MCG/ACT IN AERO
2.0000 | INHALATION_SPRAY | Freq: Two times a day (BID) | RESPIRATORY_TRACT | Status: DC
  Administered 2011-10-11 – 2011-10-22 (×20): 2 via RESPIRATORY_TRACT
  Filled 2011-10-11: qty 6.9

## 2011-10-11 MED ORDER — FUROSEMIDE 40 MG PO TABS
40.0000 mg | ORAL_TABLET | Freq: Every day | ORAL | Status: DC
  Administered 2011-10-12 – 2011-10-13 (×2): 40 mg via ORAL
  Filled 2011-10-11 (×4): qty 1

## 2011-10-11 MED ORDER — HEPARIN SODIUM (PORCINE) 5000 UNIT/ML IJ SOLN
5000.0000 [IU] | Freq: Three times a day (TID) | INTRAMUSCULAR | Status: DC
  Administered 2011-10-11 – 2011-10-22 (×29): 5000 [IU] via SUBCUTANEOUS
  Filled 2011-10-11 (×37): qty 1

## 2011-10-11 MED ORDER — OLANZAPINE 2.5 MG PO TABS
2.5000 mg | ORAL_TABLET | Freq: Every day | ORAL | Status: DC
  Administered 2011-10-11 – 2011-10-21 (×11): 2.5 mg via ORAL
  Filled 2011-10-11 (×12): qty 1

## 2011-10-11 MED ORDER — POTASSIUM CHLORIDE CRYS ER 10 MEQ PO TBCR
10.0000 meq | EXTENDED_RELEASE_TABLET | Freq: Every day | ORAL | Status: DC
  Administered 2011-10-12 – 2011-10-14 (×3): 10 meq via ORAL
  Filled 2011-10-11 (×4): qty 1

## 2011-10-11 NOTE — Clinical Social Work Note (Signed)
Pt is to discharge to CIR today, per CIR admissions coordinator. CSW is signing off as no further needs identified.   Dede Query, MSW, Theresia Majors [phone]

## 2011-10-11 NOTE — Progress Notes (Signed)
1600 Pt. Has very poor safety awareness and poor sensory/visual awareness.  High fall risk noted in chart.  Bed alarm set and call bell close in reach.  Pt. Can be impulsive at times with unsteady gait.

## 2011-10-11 NOTE — Discharge Summary (Signed)
  Discharge summary dictated on 821 3 dictation number is [phone]

## 2011-10-11 NOTE — Progress Notes (Signed)
Rehab Admissions- Met with pt who is agreeable to Inpt Rehab admit today.   Spoke w/ her sister, Jonna Clark, who says her daughter, Bjorn Loser, has been assisting pt w/ meds, cooking PTA & will be w/ pt at d/c from Rehab.  Bed available & can admit to Rehab today. [phone]

## 2011-10-11 NOTE — Plan of Care (Signed)
Overall Plan of Care [facility]) Patient Details Name: [name] MRN: [id number] DOB: 1945-12-12  Diagnosis:    Primary Diagnosis:    CVA (cerebral infarction) Co-morbidities: htn, cad, drug abuse  Functional Problem List  Patient demonstrates impairments in the following areas: Balance, Behavior, Bladder, Cognition, Endurance, Medication Management, Motor, Nutrition, Pain, Perception, Safety, Sensory , Skin Integrity and Vision  Basic ADL's: eating, grooming, bathing, dressing and toileting Advanced ADL's: light housekeeping  Transfers:  bed mobility, bed to chair, toilet, tub/shower, car and floor Locomotion:  ambulation, wheelchair mobility and stairs  Additional Impairments:  Functional use of upper extremity, Communication  comprehension and expression and Social Cognition   problem solving, memory and awareness  Anticipated Outcomes Item Anticipated Outcome  Eating/Swallowing  Supervision with self feeding  Basic self-care  Supervision-Min Assist  Tolieting  Supervision  Bowel/Bladder  Min assist  Transfers  supervision  Locomotion  Min A  Communication  Min A  Cognition  Min A  Pain  Min assist  Safety/Judgment  Min assist  Other     Therapy Plan: PT Frequency: 1-2 X/day, 60-90 minutes;5 out of 7 days OT Frequency: 1-2 X/day, 60-90 minutes SLP Frequency: 1-2 X/day, 30-60 minutes;5 out of 7 days   Team Interventions: Item RN PT OT SLP SW TR Other  Self Care/Advanced ADL Retraining   x      Neuromuscular Re-Education  x x      Therapeutic Activities  x x x     UE/LE Strength Training/ROM  x x      UE/LE Coordination Activities  x x      Visual/Perceptual Remediation/Compensation x x x      DME/Adaptive Equipment Instruction  x x      Therapeutic Exercise  x x      Balance/Vestibular Training  x x      Patient/Family Education x x x x     Cognitive Remediation/Compensation  x x x     Functional Mobility Training  x x      Ambulation/Gait Training  x x       Stair Training  x       Wheelchair Propulsion/Positioning  x       Librarian, academic x        Speech/Language Facilitation    x     Bladder Management x        Bowel Management         Disease Management/Prevention   x      Pain Management         Medication Management x        Skin Care/Wound Management         Splinting/Orthotics         Discharge Planning  x x      Psychosocial Support x  x                         Team Discharge Planning: Destination:  Home Projected Follow-up:  PT, OT and SLP Home Health Projected Equipment Needs:  Wheelchair Patient/family involved in discharge planning:  No family available  MD ELOS: 2 weeks Medical Rehab Prognosis:  Excellent Assessment: The patient has been admitted for CIR therapies. Goals are supervision to minimal assistance. The team will be addressing strength, NMR, ADL's, fxnl mobility, cognition and communication, safety,  and balance

## 2011-10-11 NOTE — Progress Notes (Signed)
Pt. Arrived to rehab unit at 1327.  Patient greeted; Alert to self and place; VSS; oriented to rehab. No complaints of pain upon time of admission.

## 2011-10-11 NOTE — Progress Notes (Signed)
INITIAL ADULT NUTRITION ASSESSMENT Date: 10/11/2011   Time: 3:10 PM Reason for Assessment: Nutrition Risk - Weight loss  Pt meets criteria for severe malnutrition in the context of chronic illness as evidenced by severe wasting of her orbital fat pads and severe wasting of the temporalis muscle.    INTERVENTION: Ensure Complete po BID, each supplement provides 350 kcal and 13 grams of protein.  ASSESSMENT: Female 66 y.o.  Dx: CVA (cerebral infarction)  Hx:  Past Medical History  Diagnosis Date  . Weight loss, unintentional   . Trouble swallowing   . Change in voice   . Substance abuse   . Dementia   . Asthma   . Shortness of breath   . Seizures     "    It has been along time "  . CHF (congestive heart failure)   . Headache   . Hypertension   . Arthralgia   . Tremors of nervous system   . Anxiety   . Rib fractures     hx of May 2012  . Heart attack 05/21/10  . Schizophrenia   . Coronary artery disease   . Stroke 07/11/10    hx of R CVA   . COPD (chronic obstructive pulmonary disease)   . Obstructive sleep apnea   . 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 aspirin  81 mg Oral Daily  . budesonide-formoterol  2 puff Inhalation BID  . carvedilol  3.125 mg Oral BID WC  . clopidogrel  75 mg Oral Daily  . famotidine  20 mg Oral BID  . furosemide  40 mg Oral Daily  . heparin  5,000 Units Subcutaneous Q8H  . OLANZapine  2.5 mg Oral QHS  . potassium chloride  10 mEq Oral Daily  . QUEtiapine  50 mg Oral QHS  . simvastatin  20 mg Oral QHS  . tiotropium  18 mcg Inhalation Daily   Ht: 5\' 1"  (154.9 cm)  Wt: 97 lbs / 44kg  Ideal Wt: 47.7 kg % Ideal Wt: 92%  Usual Wt:  Wt Readings from Last 10 Encounters:  10/11/11 87 lb 15.4 oz (39.9 kg)  10/10/11 97 lb 3.6 oz (44.1 kg)  04/13/11 100 lb 8.5 oz (45.6 kg)  04/06/11 102 lb (46.267 kg)  03/01/11 101 lb 14.4 oz (46.222 kg)  02/28/11 104 lb 11.2 oz (47.492 kg)    02/19/11 100 lb 8.5 oz (45.6 kg)  02/10/11 99 lb 9.6 oz (45.178 kg)  04/29/10 100 lb 6.4 oz (45.541 kg)  12/25/09 93 lb 4 oz (42.298 kg)    % Usual Wt: 97%  BMI: 18.3 - underweight  Food/Nutrition Related Hx:  Pt is somewhat confused and no family in the room currently. Pt does report that she has a good appetite. She states that her niece cooks mostly at home.  Pt does have visible fat and muscle wasting.  Labs:  CMP     Component Value Date/Time   NA 139 10/09/2011 0800   K 4.4 10/09/2011 0800   CL 105 10/09/2011 0800   CO2 24 10/09/2011 0800   GLUCOSE 79 10/09/2011 0800   BUN 20 10/09/2011 0800   CREATININE 1.16* 10/09/2011 0800   CALCIUM 9.7 10/09/2011 0800   PROT 7.9 10/06/2011 1726   ALBUMIN 3.9 10/06/2011 1726   AST 20 10/06/2011 1726   ALT 6 10/06/2011 1726   ALKPHOS 95 10/06/2011 1726  BILITOT 0.6 10/06/2011 1726   GFRNONAA 48* 10/09/2011 0800   GFRAA 56* 10/09/2011 0800  CBG (last 3)  No results found for this basename: GLUCAP:3 in the last 72 hours   Intake/Output Summary (Last 24 hours) at 10/11/11 1514 Last data filed at 10/11/11 1449  Gross per 24 hour  Intake    480 ml  Output      0 ml  Net    480 ml    Diet Order: Regular  Supplements/Tube Feeding: none  IVF:    Estimated Nutritional Needs:   Kcal: 1450-1650 Protein:  65-75 grams Fluid:  >1.5 L/day  NUTRITION DIAGNOSIS: -Malnutrition (NI-5.2).  Status: Ongoing  RELATED TO: suspected chronic illness  AS EVIDENCE BY: severe wasting of her orbital fat pads and severe wasting of the temporalis muscle.   MONITORING/EVALUATION(Goals): Goal: Pt to meet >/= 90% of their estimated nutrition needs. Monitor: PO intake, supplement acceptance, weight  EDUCATION NEEDS: -No education needs identified at this time  DOCUMENTATION CODES Per approved criteria  -Severe malnutrition in the context of chronic illness -Underweight    Kendell Bane RD, LDN, CNSC [phone] Pager [phone] After Hours  Pager  10/11/2011, 3:10 PM

## 2011-10-11 NOTE — H&P (Signed)
Physical Medicine and Rehabilitation Admission H&P  Chief Complaint   Patient presents with   .  Stroke Symptoms   :  HPI: [name] is a 66 y.o. right-handed African American female multi-medical with history of coronary artery disease , myocardial infarction secondary to cocaine abuse 05/21/2010 , nonischemic dilated cardiomyopathy, hypertension as well as congestive heart failure and history of stroke with left hemiparesthesias 07/11/2010. Admitted 10/06/2011 with altered mental status and slurred speech. MRI of the brain showed acute right MCA posterior division infarct with petechial hemorrhage and mild mass effect. Also chronic left MCA, right ACA and left PCA infarcts. MRA of the head with no stenosis. Echocardiogram with ejection fraction of 15% with diffuse hypokinesis no cardiac source of embolism. Carotid Dopplers with no obvious evidence of significant ICA stenosis. Patient did not receive TPA. Maintained on aspirin and Plavix therapy for CVA prophylaxis. Subcutaneous heparin added for DVT prophylaxis. Venous Doppler studies lower extremities 10/10/2011 negative for DVT. M.D. is requested physical medicine rehabilitation consult to consider inpatient rehabilitation services. Patient was felt to be a candidate for inpatient rehabilitation services and was admitted for comprehensive rehabilitation program  Review of Systems  Gastrointestinal: Positive for constipation.  Musculoskeletal: Positive for myalgias.  Neurological: Positive for weakness and headaches.  Psychiatric/Behavioral: Positive for memory loss. Anxiety, schizophrenia The patient has insomnia.  All other systems reviewed and are negative  Past Medical History   Diagnosis  Date   .  Weight loss, unintentional    .  Trouble swallowing    .  Change in voice    .  Substance abuse    .  Dementia    .  Asthma    .  Shortness of breath    .  Seizures      " It has been along time "   .  CHF (congestive heart failure)     .  Headache    .  Hypertension    .  Arthralgia    .  Tremors of nervous system    .  Anxiety    .  Rib fractures      hx of May 2012   .  Heart attack  05/21/10   .  Schizophrenia    .  Coronary artery disease    .  Stroke  07/11/10     hx of R CVA   .  COPD (chronic obstructive pulmonary disease)    .  Obstructive sleep apnea    .  Breast cancer  01/28/11     L breast, inv ductal/in situ, ER/PR -, Her2 -    Past Surgical History   Procedure  Date   .  Total abdominal hysterectomy  age 50    Family History   Problem  Relation  Age of Onset   .  Breast cancer  Mother    .  Birth defects  Mother       breast    .  Cancer  Mother       breast    .  Heart disease  Father       heart attack    .  Heart attack  Father    .  Heart attack  Brother    .  Cancer  Brother       throat, lung    .  Cancer  Paternal Aunt    .  Cancer  Maternal Grandmother       breast  Social History: reports that she quit smoking about 3 years ago. Her smoking use included Cigarettes. She has a 50 pack-year smoking history. She has never used smokeless tobacco. She reports that she drinks alcohol. She reports that she uses illicit drugs.  Allergies:  Allergies   Allergen  Reactions   .  Codeine  Itching     All over the body   .  Penicillins  Hives and Other (See Comments)     "Whelps" per patient.   .  Hydralazine      02/28/11 Family unsure if this is allergy for pt    Medications Prior to Admission   Medication  Sig  Dispense  Refill   .  albuterol (PROVENTIL) 90 MCG/ACT inhaler  Inhale 2 puffs into the lungs every 6 (six) hours as needed. For wheezing     .  budesonide-formoterol (SYMBICORT) 80-4.5 MCG/ACT inhaler  Inhale 2 puffs into the lungs 2 (two) times daily.     .  carvedilol (COREG) 3.125 MG tablet  Take 3.125 mg by mouth 2 (two) times daily with a meal.     .  clopidogrel (PLAVIX) 75 MG tablet  Take 75 mg by mouth daily.     .  furosemide (LASIX) 40 MG tablet  Take 40  mg by mouth daily.     .  nitroGLYCERIN (NITROSTAT) 0.4 MG SL tablet  Place 0.4 mg under the tongue every 5 (five) minutes as needed. For chest pain     .  omeprazole (PRILOSEC) 20 MG capsule  Take 20 mg by mouth daily.      Home:  Home Living  Lives With: Alone  Available Help at Discharge: Family  Type of Home: Apartment  Home Access: Stairs to enter  Secretary/administrator of Steps: 1  Home Layout: One level  Bathroom Shower/Tub: Medical sales representative: Standard  Home Adaptive Equipment: Walker - rolling;Straight cane;Shower chair with back;Bedside commode/3-in-1  Additional Comments: attempted to call both sisters. No contact.  Functional History:  Prior Function  Meal Prep: Maximal  Light Housekeeping: Moderate  Able to Take Stairs?: Yes  Driving: No  Functional Status:  Mobility:  Bed Mobility  Bed Mobility: Supine to Sit  Left Sidelying to Sit: 4: Min assist  Supine to Sit: 4: Min assist  Sitting - Scoot to Edge of Bed: 5: Supervision  Sit to Supine: 5: Supervision;HOB flat  Transfers  Transfers: Sit to Stand;Stand to Sit  Sit to Stand: 4: Min assist;From bed;With upper extremity assist  Stand to Sit: With armrests;With upper extremity assist;To chair/3-in-1  Ambulation/Gait  Ambulation/Gait Assistance: 4: Min assist  Ambulation Distance (Feet): 80 Feet  Assistive device: Rolling walker  Ambulation/Gait Assistance Details: Patient complained of soreness on LLE and not completely putting heel on the ground. The more patient ambulated the more she was able tolerate foot on floor  Gait Pattern: Step-through pattern;Decreased stride length;Trunk flexed   ADL:  ADL  Eating/Feeding: Performed;Set up  Where Assessed - Eating/Feeding: Chair  Grooming: Performed;Wash/dry hands;Minimal assistance  Where Assessed - Grooming: Supported standing  Upper Body Bathing: Simulated;Supervision/safety  Where Assessed - Upper Body Bathing: Unsupported sitting  Lower Body  Bathing: Simulated;Minimal assistance  Where Assessed - Lower Body Bathing: Supported sit to stand  Upper Body Dressing: Performed;Minimal assistance  Where Assessed - Upper Body Dressing: Unsupported sitting  Lower Body Dressing: Performed;Set up  Where Assessed - Lower Body Dressing: Unsupported sitting  Toilet Transfer: Performed;Minimal assistance  Toilet Transfer Method: Sit  to Production manager: Comfort height toilet  Equipment Used: Gait belt  Transfers/Ambulation Related to ADLs: min assist with RW  ADL Comments: A for safety. Difficulty with spatial relations.  Cognition:  Cognition  Overall Cognitive Status: Impaired  Arousal/Alertness: Awake/alert  Orientation Level: Oriented to person;Oriented to place  Attention: Focused;Sustained  Focused Attention: Appears intact  Sustained Attention: Impaired  Sustained Attention Impairment: Verbal basic;Functional basic  Memory: Impaired  Memory Impairment: Storage deficit;Retrieval deficit;Decreased recall of new information;Decreased short term memory;Decreased long term memory  Decreased Short Term Memory: Verbal basic;Functional basic  Awareness: Impaired  Awareness Impairment: Intellectual impairment (minimally improved by end of evaluation)  Problem Solving: Impaired  Problem Solving Impairment: Verbal basic;Functional basic  Safety/Judgment: Impaired  Cognition  Overall Cognitive Status: Impaired  Area of Impairment: Attention;Memory;Safety/judgement;Awareness of deficits;Problem solving  Arousal/Alertness: Awake/alert  Orientation Level: Disoriented X4;Time  Behavior During Session: WFL for tasks performed  Current Attention Level: Sustained  Memory: Decreased recall of precautions  Memory Deficits: decrased STM  Safety/Judgement: Decreased safety judgement for tasks assessed  Problem Solving: extended time for problem solving  Executive Functioning: poor self regulatory behavior  Blood pressure 128/89,  pulse 56, temperature 97.6 F (36.4 C), temperature source Oral, resp. rate 18, height 5\' 1"  (1.549 m), weight 44.1 kg (97 lb 3.6 oz), SpO2 100.00%.  Physical Exam  Constitutional:  frail female appearing older than stated age.  HENT: dentition is poor, oral mucosa is pink Head: Normocephalic.  Eyes:  Pupils reactive to light, EOMI,  Neck: No thyromegaly present.  Cardiovascular: Normal rate and regular rhythm.  Pulmonary/Chest: Breath sounds normal. No respiratory distress. She has no wheezes.  Abdominal: Bowel sounds are normal. She exhibits no distension. There is no tenderness.  Musculoskeletal: She exhibits no edema.  Neurological: She is alert.  Patient names person place and age. Flat affect. She follows simple to three-step commands. She could name familiar objects. Left upper ext 4. LLE is 2-33/5. No gross sensory deficits. Mild left facial droop. Speech is of low volume and slightly slurred. siting balance is good. She is perceptive and at times difficult to redirect.  Psychiatric: Her affect is blunt. She is slowed  Results for orders placed during the hospital encounter of 10/06/11 (from the past 48 hour(s))   CBC Status: Abnormal    Collection Time    10/09/11 8:00 AM   Component  Value  Range  Comment    WBC  3.5 (*)  4.0 - 10.5 K/uL     RBC  4.37  3.87 - 5.11 MIL/uL     Hemoglobin  13.2  12.0 - 15.0 g/dL     HCT  16.1  09.6 - 04.5 %     MCV  92.0  78.0 - 100.0 fL     MCH  30.2  26.0 - 34.0 pg     MCHC  32.8  30.0 - 36.0 g/dL     RDW  40.9  81.1 - 91.4 %     Platelets  172  150 - 400 K/uL    BASIC METABOLIC PANEL Status: Abnormal    Collection Time    10/09/11 8:00 AM   Component  Value  Range  Comment    Sodium  139  135 - 145 mEq/L     Potassium  4.4  3.5 - 5.1 mEq/L     Chloride  105  96 - 112 mEq/L     CO2  24  19 - 32 mEq/L  Glucose, Bld  79  70 - 99 mg/dL     BUN  20  6 - 23 mg/dL     Creatinine, Ser  1.61 (*)  0.50 - 1.10 mg/dL     Calcium  9.7  8.4 -  10.5 mg/dL     GFR calc non Af Amer  48 (*)  >90 mL/min     GFR calc Af Amer  56 (*)  >90 mL/min     No results found.  Post Admission Physician Evaluation:  1. Functional deficits secondary to thrombotic right MCA infarct. 2. Patient is admitted to receive collaborative, interdisciplinary care between the physiatrist, rehab nursing staff, and therapy team. 3. Patient's level of medical complexity and substantial therapy needs in context of that medical necessity cannot be provided at a lesser intensity of care such as a SNF. 4. Patient has experienced substantial functional loss from his/her baseline which was documented above under the "Functional History" and "Functional Status" headings. Judging by the patient's diagnosis, physical exam, and functional history, the patient has potential for functional progress which will result in measurable gains while on inpatient rehab. These gains will be of substantial and practical use upon discharge in facilitating mobility and self-care at the household level. 5. Physiatrist will provide 24 hour management of medical needs as well as oversight of the therapy plan/treatment and provide guidance as appropriate regarding the interaction of the two. 6. 24 hour rehab nursing will assist with bladder management, bowel management, safety, skin/wound care, disease management, medication administration, pain management and patient education and help integrate therapy concepts, techniques,education, etc. 7. PT will assess and treat for: fxnl mobility, NMR, lower ext strength, adaptive equipment, safety. Goals are: supervision to mod I. 8. OT will assess and treat for: UES, ADL's, safety, NMR, adaptive equipment, fxnl mobility. Goals are: mod I to supervision. 9. SLP will assess and treat for: speech intelligibility, cognition. Goals are: mod I to supervision. 10. Case Management and Social Worker will assess and treat for psychological issues and discharge  planning. 11. Team conference will be held weekly to assess progress toward goals and to determine barriers to discharge. 12. Patient will receive at least 3 hours of therapy per day at least 5 days per week. 13. ELOS: 7 days Prognosis: excellent Medical Problem List and Plan:  1. Right MCA thrombotic infarction  2. DVT Prophylaxis/Anticoagulation: Subcutaneous heparin. Monitor platelet counts any signs of bleeding  3. Mood: History of anxiety as well as schizophrenia. Continue Zyprexa 2.5 mg daily as well as Seroquel 50 mg at bedtime. Provide emotional support and positive reinforcement  4. Neuropsych: This patient is capable of making decisions on his/her own behalf.  5. Hypertension. Coreg 3.125 mg twice a day, Lasix 40 mg daily. Monitor with increased mobility  6. Asthma. Symbicort twice a day, Spiriva daily, Xopenex every 6 hours as needed. Patient currently denies any shortness of breath. Check oxygen saturations every shift  7. 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 Continue aspirin daily. Patient denies any chest pain- check weights, follow i's and o's. 8. CHF. Continue Lasix 40 mg daily. Monitor for any signs of fluid overload  9. Hyperlipidemia. Zocor  10. GERD. Pepcid  10. History of polysubstance abuse. Latest urine drug screen negative  Ivory Broad, MD 10/11/11

## 2011-10-11 NOTE — PMR Pre-admission (Signed)
PMR Admission Coordinator Pre-Admission Assessment  Patient: [name] is an 66 y.o., female MRN: [id number] DOB: 09/01/1945 Height: 5\' 1"  (154.9 cm) Weight: 44.1 kg (97 lb 3.6 oz)  Insurance Information HMO:      PPO:       PCP:       IPA:       80/20:       OTHER:   PRIMARY: Medicare A & B      Policy#: [id number]      Subscriber: pt Benefits:  Phone #:       Name:  [employer] Eff. Date: A-03/24/10 B-02/21/10     Deduct: $1184      Out of Pocket Max: none      Life Max: unlimited CIR: 100%      SNF: 20 full 80 copay days  LBD 04/13/11 Outpatient: 80%     Co-Pay: 20% Home Health: 100%      Co-Pay: none DME: 80%     Co-Pay: 20% Providers: pt's choice SECONDARY: Medicaid [place] Access      Policy#: [id number]      Subscriber: pt CM Name:        Phone#:       Fax#:   Pre-Cert#:        Employer:   Benefits:  Phone #: [phone]     Name:   Eff. Date:       Deduct:        Out of Pocket Max:        Life Max:   CIR:        SNF:   Outpatient:       Co-Pay:   Home Health:        Co-Pay:   DME:       Co-Pay:    Emergency Contact Information Contact Information    Name Relation Home Work Mobile   Maywood Sister [phone]  [phone]   Valentina Gu [phone]    Astrid Drafts Niece   [phone]     Current Medical History  Patient Admitting Diagnosis: R. MCA CVA  History of Present Illness:  66 y.o. right-handed female multi-medical with history of coronary artery disease a myocardial infarction secondary to cocaine abuse, nonischemic dilated cardiomyopathy, hypertension as well as congestive heart failure and history of stroke with left hemiparesthesias. Admitted 10/06/2011 with altered mental status and slurred speech. MRI of the brain showed acute right MCA posterior division infarct with petechial hemorrhage and mild mass effect. Also chronic left MCA, right ACA and left PCA infarcts. MRA of the head with no stenosis. Echocardiogram with  ejection fraction of 15% with diffuse hypokinesis no cardiac source of embolism. Carotid Dopplers with no obvious evidence of significant ICA stenosis. Patient did not receive TPA. Maintained on aspirin and Plavix therapy for CVA prophylaxis. Subcutaneous heparin added for DVT prophylaxis  Total: 5 =NIH  Past Medical History  Past Medical History  Diagnosis Date  . Weight loss, unintentional   . Trouble swallowing   . Change in voice   . Substance abuse   . Dementia   . Asthma   . Shortness of breath   . Seizures     "    It has been along time "  . CHF (congestive heart failure)   . Headache   . Hypertension   . Arthralgia   . Tremors of nervous system   . Anxiety   . Rib fractures     hx of May 2012  .  Heart attack 05/21/10  . Schizophrenia   . Coronary artery disease   . Stroke 07/11/10    hx of R CVA   . COPD (chronic obstructive pulmonary disease)   . Obstructive sleep apnea   . Breast cancer 01/28/11    L breast, inv ductal/in situ, ER/PR -, Her2 -    Family History  family history includes Birth defects in her mother; Breast cancer in her mother; Cancer in her brother, maternal grandmother, mother, and paternal aunt; Heart attack in her brother and father; and Heart disease in her father.  Prior Rehab/Hospitalizations:  2012 s/p previous CVA spent about a month at [facility]   Current Medications  Current facility-administered medications:0.9 %  sodium chloride infusion, , Intravenous, Continuous, Robynn Pane, MD, Last Rate: 20 mL/hr at 10/06/11 2245, 20 mL/hr at 10/06/11 2245;  acetaminophen (TYLENOL) tablet 650 mg, 650 mg, Oral, Q6H PRN, Thana Farr, MD, 650 mg at 10/10/11 1801;  aspirin chewable tablet 81 mg, 81 mg, Oral, Daily, Layne Benton, NP, 81 mg at 10/10/11 1041 budesonide-formoterol (SYMBICORT) 80-4.5 MCG/ACT inhaler 2 puff, 2 puff, Inhalation, BID, Robynn Pane, MD, 2 puff at 10/11/11 0831;  carvedilol (COREG) tablet 3.125 mg, 3.125 mg,  Oral, BID WC, Tara C Jernejcic, PA, 3.125 mg at 10/10/11 0825;  clopidogrel (PLAVIX) tablet 75 mg, 75 mg, Oral, Daily, Robynn Pane, MD, 75 mg at 10/10/11 1041;  docusate sodium (COLACE) capsule 100 mg, 100 mg, Oral, BID, Robynn Pane, MD, 100 mg at 10/10/11 2145 famotidine (PEPCID) tablet 20 mg, 20 mg, Oral, BID, Robynn Pane, MD, 20 mg at 10/10/11 2144;  furosemide (LASIX) tablet 40 mg, 40 mg, Oral, Daily, Robynn Pane, MD, 40 mg at 10/10/11 1041;  heparin injection 5,000 Units, 5,000 Units, Subcutaneous, Q8H, Robynn Pane, MD, 5,000 Units at 10/11/11 0536;  levalbuterol (XOPENEX) nebulizer solution 1.25 mg, 1.25 mg, Nebulization, Q6H PRN, Robynn Pane, MD nitroGLYCERIN (NITROSTAT) SL tablet 0.4 mg, 0.4 mg, Sublingual, Q5 min PRN, Robynn Pane, MD;  OLANZapine (ZYPREXA) tablet 2.5 mg, 2.5 mg, Oral, QHS, Robynn Pane, MD, 2.5 mg at 10/10/11 2144;  potassium chloride (K-DUR,KLOR-CON) CR tablet 10 mEq, 10 mEq, Oral, Daily, Ricki Rodriguez, MD, 10 mEq at 10/10/11 1041;  QUEtiapine (SEROQUEL) tablet 50 mg, 50 mg, Oral, QHS, Robynn Pane, MD, 50 mg at 10/10/11 2144 senna-docusate (Senokot-S) tablet 1 tablet, 1 tablet, Oral, QHS PRN, Robynn Pane, MD, 1 tablet at 10/09/11 2115;  simvastatin (ZOCOR) tablet 20 mg, 20 mg, Oral, QHS, Robynn Pane, MD, 20 mg at 10/10/11 2144;  tiotropium (SPIRIVA) inhalation capsule 18 mcg, 18 mcg, Inhalation, Daily, Robynn Pane, MD, 18 mcg at 10/11/11 0830;  traZODone (DESYREL) tablet 50 mg, 50 mg, Oral, QHS, Robynn Pane, MD, 50 mg at 10/10/11 2143  Patients Current Diet: General  Precautions / Restrictions Precautions Precautions: Fall Restrictions Other Position/Activity Restrictions: Lt inattention vs Lt visual field cut   Prior Activity Level Household: Occasionally sister, Jonna Clark, took her to church; niece would assist her around the block-pt on her scooter.   Home Assistive Devices / Equipment Home Assistive Devices/Equipment:  Other (Comment) (Sister, Jonna Clark, says pt also has a scooter.) Home Adaptive Equipment: Walker - rolling;Straight cane;Shower chair with back;Bedside commode/3-in-1  Prior Functional Level Prior Function Level of Independence: Needs assistance Needs Assistance: Meal Prep;Light Housekeeping Meal Prep: Maximal Light Housekeeping: Moderate Able to Take Stairs?: Yes Driving: No Comments: Niece assisted her w/ meds  Current Functional Level Cognition  Arousal/Alertness: Awake/alert Overall Cognitive Status: Impaired Overall Cognitive Status: Impaired Current Attention Level: Sustained Memory: Decreased recall of precautions Memory Deficits: decrased STM Orientation Level: Oriented to person;Oriented to place Safety/Judgement: Decreased safety judgement for tasks assessed Executive Functioning: poor self regulatory behavior Attention: Focused;Sustained Focused Attention: Appears intact Sustained Attention: Impaired Sustained Attention Impairment: Verbal basic;Functional basic Memory: Impaired Memory Impairment: Storage deficit;Retrieval deficit;Decreased recall of new information;Decreased short term memory;Decreased long term memory Decreased Short Term Memory: Verbal basic;Functional basic Awareness: Impaired Awareness Impairment: Intellectual impairment (minimally improved by end of evaluation) Problem Solving: Impaired Problem Solving Impairment: Verbal basic;Functional basic Safety/Judgment: Impaired    Extremity Assessment (includes Sensation/Coordination)  RUE ROM/Strength/Tone: WFL for tasks assessed RUE Sensation: WFL - Light Touch;WFL - Proprioception RUE Coordination: Deficits  RLE ROM/Strength/Tone: WFL for tasks assessed RLE Sensation: WFL - Light Touch (grossly WFL-difficult to assess due to decr cognition) RLE Coordination: WFL - gross/fine motor    ADLs  Eating/Feeding: Performed;Set up Where Assessed - Eating/Feeding: Chair Grooming: Performed;Wash/dry  hands;Minimal assistance Where Assessed - Grooming: Supported standing Upper Body Bathing: Simulated;Supervision/safety Where Assessed - Upper Body Bathing: Unsupported sitting Lower Body Bathing: Simulated;Minimal assistance Where Assessed - Lower Body Bathing: Supported sit to stand Upper Body Dressing: Performed;Minimal assistance Where Assessed - Upper Body Dressing: Unsupported sitting Lower Body Dressing: Performed;Set up Where Assessed - Lower Body Dressing: Unsupported sitting Toilet Transfer: Performed;Minimal assistance Toilet Transfer Method: Sit to stand Toilet Transfer Equipment: Comfort height toilet Toileting - Clothing Manipulation and Hygiene: Performed;Minimal assistance Where Assessed - Glass blower/designer Manipulation and Hygiene: Other (comment);Standing Equipment Used: Gait belt Transfers/Ambulation Related to ADLs: min assist with RW ADL Comments: A for safety. Difficulty with spatial relations.    Mobility  Bed Mobility: Supine to Sit Left Sidelying to Sit: 4: Min assist Supine to Sit: 4: Min assist Sitting - Scoot to Edge of Bed: 5: Supervision Sit to Supine: 5: Supervision;HOB flat    Transfers  Transfers: Sit to Stand;Stand to Sit Sit to Stand: 4: Min assist;From bed;With upper extremity assist Stand to Sit: With armrests;With upper extremity assist;To chair/3-in-1    Ambulation / Gait / Stairs / Wheelchair Mobility  Ambulation/Gait Ambulation/Gait Assistance: 4: Min Environmental consultant (Feet): 80 Feet Assistive device: Rolling walker Ambulation/Gait Assistance Details: Patient complained of soreness on LLE and not completely putting heel on the ground. The more patient ambulated the more she was able tolerate foot on floor Gait Pattern: Step-through pattern;Decreased stride length;Trunk flexed    Posture / Balance Static Sitting Balance Static Sitting - Balance Support: No upper extremity supported;Feet supported Static Sitting - Level of  Assistance: 7: Independent Static Standing Balance Static Standing - Balance Support: Bilateral upper extremity supported Static Standing - Level of Assistance: 4: Min assist     Previous Home Environment Living Arrangements: Alone Lives With: Alone Available Help at Discharge: Family Type of Home: Apartment Home Layout: One level Home Access: Stairs to enter Secretary/administrator of Steps: 1 Bathroom Shower/Tub: Engineer, manufacturing systems: Standard Home Care Services: Yes Type of Home Care Services: Homehealth aide;Home PT;Home OT Additional Comments: attempted to call both sisters. No contact.  Discharge Living Setting Plans for Discharge Living Setting: Patient's home;Apartment;Other (Comment) (Niece has been assisting pt & will resume.) Type of Home at Discharge: Apartment Discharge Home Layout: One level Discharge Home Access: Stairs to enter Entrance Stairs-Number of Steps: 1 Do you have any problems obtaining your medications?: Yes (Describe)  Social/Family/Support Systems Patient Roles: Parent;Other (Comment) (sister,  aunt) Contact Information: Dolores Patty, sister [phone] Anticipated Caregiver: Astrid Drafts, niece Astrid Drafts, niece) Anticipated Caregiver's Contact Information: [phone] Ability/Limitations of Caregiver: Niece assisted PTA Caregiver Availability: 24/7 Discharge Plan Discussed with Primary Caregiver: Yes (Spoke w/ pt's sister, Jonna Clark) Is Caregiver In Agreement with Plan?: Yes Does Caregiver/Family have Issues with Lodging/Transportation while Pt is in Rehab?: No  Goals/Additional Needs Patient/Family Goal for Rehab: PT mod I/S, OT S, ST S Expected length of stay: 7-10 days Cultural Considerations: [employer] Dietary Needs: Regular/Thin Equipment Needs: TBD Pt/Family Agrees to Admission and willing to participate: Yes Program Orientation Provided & Reviewed with Pt/Caregiver Including Roles  & Responsibilities: Yes  Patient  Condition: This patient's condition remains as documented in the Consult dated  10/10/11, in which the Rehabilitation Physician determined and documented that the patient's condition is appropriate for intensive rehabilitative care in an inpatient rehabilitation facility.  Preadmission Screen Completed By:  Brock Ra, 10/11/2011 10:27 AM ______________________________________________________________________   Discussed status with Dr.  Riley Kill on 10/11/11 at 10:34 and received telephone approval for admission today.  Admission Coordinator:  Brock Ra, time 10:35 Dorna Bloom 10/11/11

## 2011-10-11 NOTE — Progress Notes (Signed)
Stroke education provided to patient. S/S of stroke, risk factors, and lifestyle modification discussed. Patient transferred to CIR with no signs of acute distress.

## 2011-10-11 NOTE — Progress Notes (Signed)
Physical Therapy Treatment Patient Details Name: [name] MRN: [id number] DOB: [birth year] Today's Date: 10/11/2011 Time: 7829-5621 PT Time Calculation (min): 18 min  PT Assessment / Plan / Recommendation Comments on Treatment Session  Patient limited today by her agitation towards staff. RN stated that it has been this way all morning. Patient agreeable to ambulation however self-limiting    Follow Up Recommendations  Home health PT;Supervision/Assistance - 24 hour    Barriers to Discharge        Equipment Recommendations  None recommended by PT;None recommended by OT    Recommendations for Other Services    Frequency Min 4X/week   Plan Discharge plan remains appropriate;Frequency remains appropriate    Precautions / Restrictions Precautions Precautions: Fall   Pertinent Vitals/Pain     Mobility  Bed Mobility Supine to Sit: 4: Min guard;With rails Sitting - Scoot to Edge of Bed: 4: Min guard Details for Bed Mobility Assistance: Cues for safety as patient slightly impulsive Transfers Sit to Stand: 4: Min assist;With upper extremity assist;From bed Stand to Sit: 4: Min assist;With armrests;To chair/3-in-1 Details for Transfer Assistance:  A to ensure balance and cues for safe technique Ambulation/Gait Ambulation/Gait Assistance: 4: Min assist Ambulation Distance (Feet): 150 Feet Assistive device: Rolling walker Ambulation/Gait Assistance Details: Patient requiring A for safety with RW. Patient needing cues for avoid obstacle. Pt not able to adjust self in walker there repeativily hit her feet. Cues for safety throughout Gait Pattern: Left circumduction;Wide base of support;Left foot flat;Left steppage General Gait Details: Patient very sporatic with gait this session. Patient requiring lots of safety cues with balance and use of RW    Exercises     PT Diagnosis:    PT Problem List:   PT Treatment Interventions:     PT Goals Acute Rehab PT Goals PT Goal:  Supine/Side to Sit - Progress: Progressing toward goal PT Goal: Sit to Stand - Progress: Progressing toward goal PT Goal: Ambulate - Progress: Progressing toward goal Additional Goals PT Goal: Additional Goal #1 - Progress: Progressing toward goal  Visit Information  Last PT Received On: 10/11/11 Assistance Needed: +1    Subjective Data      Cognition  Overall Cognitive Status: Impaired Area of Impairment: Attention;Memory;Following commands;Safety/judgement;Awareness of deficits;Problem solving Arousal/Alertness: Awake/alert Orientation Level: Disoriented to;Time;Situation Behavior During Session: Agitated Current Attention Level: Sustained Following Commands: Follows one step commands inconsistently Safety/Judgement: Decreased awareness of safety precautions;Decreased safety judgement for tasks assessed Cognition - Other Comments: Patient with heighten agiatation/frustration this session.    Balance     End of Session PT - End of Session Equipment Utilized During Treatment: Gait belt Activity Tolerance: Treatment limited secondary to agitation Patient left: in chair;with call bell/phone within reach Nurse Communication: Mobility status   GP     Ryosuke Ericksen, Adline Potter 10/11/2011, 1:40 PM 10/11/2011 Fredrich Birks PTA [phone] pager [phone] office

## 2011-10-12 ENCOUNTER — Inpatient Hospital Stay (HOSPITAL_COMMUNITY): Payer: Medicare Other | Admitting: Physical Therapy

## 2011-10-12 ENCOUNTER — Inpatient Hospital Stay (HOSPITAL_COMMUNITY): Payer: Medicare Other | Admitting: Occupational Therapy

## 2011-10-12 ENCOUNTER — Inpatient Hospital Stay (HOSPITAL_COMMUNITY): Payer: Medicare Other | Admitting: Speech Pathology

## 2011-10-12 ENCOUNTER — Inpatient Hospital Stay (HOSPITAL_COMMUNITY): Payer: Medicare Other | Admitting: *Deleted

## 2011-10-12 LAB — CBC WITH DIFFERENTIAL/PLATELET
Basophils Absolute: 0 10*3/uL (ref 0.0–0.1)
Basophils Relative: 1 % (ref 0–1)
Hemoglobin: 15 g/dL (ref 12.0–15.0)
MCHC: 32.9 g/dL (ref 30.0–36.0)
Neutro Abs: 2.6 10*3/uL (ref 1.7–7.7)
Neutrophils Relative %: 60 % (ref 43–77)
RDW: 14.2 % (ref 11.5–15.5)

## 2011-10-12 LAB — COMPREHENSIVE METABOLIC PANEL
AST: 43 U/L — ABNORMAL HIGH (ref 0–37)
Albumin: 3.8 g/dL (ref 3.5–5.2)
Alkaline Phosphatase: 90 U/L (ref 39–117)
Chloride: 102 mEq/L (ref 96–112)
Potassium: 4.3 mEq/L (ref 3.5–5.1)
Sodium: 140 mEq/L (ref 135–145)
Total Bilirubin: 0.2 mg/dL — ABNORMAL LOW (ref 0.3–1.2)

## 2011-10-12 NOTE — Evaluation (Signed)
Physical Therapy Assessment and Plan  Patient Details  Name: [name] MRN: [id number] Date of Birth: 1945/04/30  PT Diagnosis: Abnormal posture, Abnormality of gait, Cognitive deficits, Difficulty walking, Hemiplegia non-dominant and Impaired sensation Rehab Potential: Fair ELOS: 12-14 days   Today's Date: 10/12/2011 Time: 0905-1000 Time Calculation (min): 55 min  Problem List:  Patient Active Problem List  Diagnosis  . HYPERLIPIDEMIA  . SCHIZOPHRENIA  . ANXIETY  . COCAINE ABUSE  . HYPERTENSION, BENIGN ESSENTIAL  . MYOCARDIAL INFARCTION  . CAD  . CHF  . Cerebral embolism with cerebral infarction  . Aneurysm of other specified artery  . ASTHMA  . COPD  . CONSTIPATION  . CHOLELITHIASIS  . RENAL FAILURE, ACUTE  . LIVER FUNCTION TESTS, ABNORMAL, HX OF  . Trouble swallowing  . Breast cancer, stage 2  . Hypertension  . Breast cancer  . Arthralgia  . Chest pain at rest  . CVA (cerebral infarction)    Past Medical History:  Past Medical History  Diagnosis Date  . Weight loss, unintentional   . Trouble swallowing   . Change in voice   . Substance abuse   . Dementia   . Asthma   . Shortness of breath   . Seizures     "    It has been along time "  . CHF (congestive heart failure)   . Headache   . Hypertension   . Arthralgia   . Tremors of nervous system   . Anxiety   . Rib fractures     hx of May 2012  . Heart attack 05/21/10  . Schizophrenia   . Coronary artery disease   . Stroke 07/11/10    hx of R CVA   . COPD (chronic obstructive pulmonary disease)   . Obstructive sleep apnea   . Breast cancer 01/28/11    L breast, inv ductal/in situ, ER/PR -, Her2 -   Past Surgical History:  Past Surgical History  Procedure Date  . Total abdominal hysterectomy age 66    Assessment & Plan Clinical Impression: Patient is a 66 y.o. right-handed African American female multi-medical with history of coronary artery disease , myocardial infarction secondary to  cocaine abuse 05/21/2010 , nonischemic dilated cardiomyopathy, hypertension as well as congestive heart failure and history of stroke with left hemiparesthesias 07/11/2010. Admitted 10/06/2011 with altered mental status and slurred speech. MRI of the brain showed acute right MCA posterior division infarct with petechial hemorrhage and mild mass effect. Also chronic left MCA, right ACA and left PCA infarcts. MRA of the head with no stenosis. Echocardiogram with ejection fraction of 15% with diffuse hypokinesis no cardiac source of embolism. Carotid Dopplers with no obvious evidence of significant ICA stenosis. Patient did not receive TPA. Maintained on aspirin and Plavix therapy for CVA prophylaxis. Subcutaneous heparin added for DVT prophylaxis. Venous Doppler studies lower extremities 10/10/2011 negative for DVT.  Patient transferred to CIR on 10/11/2011 .   Patient currently requires max with mobility secondary to muscle weakness, decreased cardiorespiratoy endurance, decreased coordination and decreased motor planning, visual field impairments, decreased attention to left, decreased attention, decreased problem solving, decreased safety awareness and decreased memory and decreased standing balance, decreased postural control, hemiplegia and decreased balance strategies.  Prior to hospitalization, patient was supervision with mobility and lived with Family in a Apartment home.  Home access is 1Stairs to enter.  Patient will benefit from skilled PT intervention to maximize safe functional mobility, minimize fall risk and decrease caregiver burden for  planned discharge home with 24 hour assist.  Anticipate patient will benefit from follow up [facility] at discharge.  PT - End of Session Activity Tolerance: Decreased this session Endurance Deficit: Yes Endurance Deficit Description: poor activity tolerance, fatigue, poor frustration tolerance, refuses frequently PT Assessment Rehab Potential: Fair Barriers to  Discharge: Other (comment) (patient willingness to participate) PT Plan PT Frequency: 1-2 X/day, 60-90 minutes;5 out of 7 days Estimated Length of Stay: 12-14 days PT Treatment/Interventions: Ambulation/gait training;Cognitive remediation/compensation;Balance/vestibular training;Discharge planning;DME/adaptive equipment instruction;Functional mobility training;Neuromuscular re-education;Patient/family education;Splinting/orthotics;Stair training;Therapeutic Activities;Therapeutic Exercise;UE/LE Strength taining/ROM;UE/LE Coordination activities;Visual/perceptual remediation/compensation;Wheelchair propulsion/positioning PT Recommendation Recommendations for Other Services: Neuropsych consult Follow Up Recommendations: Home health PT;24 hour supervision/assistance Equipment Recommended: Wheelchair (measurements)  PT Evaluation Precautions/Restrictions Precautions Precautions: Fall Precaution Comments: Impulsive and unsafe,  significant visual field impairments difficult to assess formally-has limitations in all visual fields functionally and L inattention vs. l visual field cut, CHF, COPD, CAD, h/o MI, cocaine abuse, Schizophrenia, dementia General @[email or web address] ([phone]) Vital Signs Therapy Vitals Pulse Rate: 70  BP: 110/70 mmHg Oxygen Therapy SpO2: 100 % O2 Device: None (Room air) Pain Pain Assessment Pain Assessment: No/denies pain Home Living/Prior Functioning Home Living Lives With: Family Available Help at Discharge: Family Type of Home: Apartment Home Access: Stairs to enter Secretary/administrator of Steps: 1 Home Layout: One level Home Adaptive Equipment: Walker - rolling Additional Comments: Need to verify when family available Prior Function Level of Independence: Requires assistive device for independence;Needs assistance with homemaking;Independent with transfers;Independent with gait Able to Take Stairs?: Yes Driving: No Vision/Perception  Vision -  History Patient Visual Report: Diplopia Vision - Assessment Visual Fields: Left visual field deficit Perception Perception: Impaired Inattention/Neglect: Does not attend to left visual field;Does not attend to left side of body (during functional mobility)  Cognition Overall Cognitive Status: Impaired Sensation Sensation Light Touch: Impaired Detail Light Touch Impaired Details: Impaired LLE;Impaired LUE Stereognosis: Not tested Hot/Cold: Not tested Proprioception: Not tested Coordination Gross Motor Movements are Fluid and Coordinated: No Coordination and Movement Description: Limited by weakness Motor  Motor Motor: Hemiplegia;Abnormal postural alignment and control Motor - Skilled Clinical Observations: L hemiplegia; in standing presents with strong posterior and R lateral lean  Mobility Transfers Stand Pivot Transfers: 3: Mod assist Stand Pivot Transfer Details (indicate cue type and reason): Required mod A for lifting and for safety and sequencing secondary to L inattention and L visual field impairments; attempts to pivot only to R, keeps LLE in front of body with poor L lateral weight shift and L stance during pivot w/c <> recliner  Locomotion  Ambulation Ambulation: Yes Ambulation/Gait Assistance: 2: Max assist Ambulation Distance (Feet): 50 Feet Assistive device: 1 person hand held assist Ambulation/Gait Assistance Details: Gait with L HHA in distracting environment; patient unable to maintain straight navigation, attempting to turn to L; maintains LLE out in front of body, ABD, ER with L trunk and pelvis posteriorly retracted and strong R posterior lean with poor L lateral and forward weight shift becomes very anxious and feels as if she is falling when facilitated to LLE Gait Gait Pattern: Step-to pattern;Decreased step length - right;Decreased stance time - left;Decreased weight shift to left;Decreased dorsiflexion - left;Lateral trunk lean to right;Trunk rotated  posteriorly on left Stairs / Additional Locomotion Stairs: Yes Stairs Assistance: 3: Mod assist Stairs Assistance Details (indicate cue type and reason): Up and down 5 stairs with bilat UE support on rails with mod A for safety, attention to L foot placement and clearance on step, maintains L trunk and pelvis  retracted; cued to lead with LLE to ascend and descend to facilitate L lateral and forward weight shift and increased stance on L Stair Management Technique: Two rails;Step to pattern;Forwards Number of Stairs: 5  Height of Stairs: 4  Corporate treasurer: Yes Wheelchair Assistance: 3: Mod Education officer, museum: Both upper extremities Wheelchair Parts Management: Needs assistance Distance: 100 with cues for hand placement, UE propulsion sequence, and for attention to obstacles on L  Trunk/Postural Assessment  Postural Control Postural Control: Deficits on evaluation (In standing presents with R, posterior lean/push)  Balance Static Sitting Balance Static Sitting - Balance Support: Feet supported Static Sitting - Level of Assistance: 5: Stand by assistance Dynamic Sitting Balance Dynamic Sitting - Balance Support: Feet supported Dynamic Sitting - Level of Assistance: 5: Stand by assistance Static Standing Balance Static Standing - Balance Support: Left upper extremity supported Static Standing - Level of Assistance: 2: Max assist Dynamic Standing Balance Dynamic Standing - Balance Support: Left upper extremity supported Dynamic Standing - Level of Assistance: 2: Max assist;1: +1 Total assist  Attempted standing balance and postural control retraining with one > bilat UE support during L lateral and forward weight shifts to tap R toes on low step; patient became very anxious and angry with cussing outbursts and continued to push posteriorly and sit in w/c and refuse to participate; patient adamantly demanded to be returned to room to rest.  Extremity  Assessment      RLE Assessment RLE Assessment: Within Functional Limits LLE Assessment LLE Assessment: Exceptions to [facility] LLE Strength LLE Overall Strength: Deficits LLE Overall Strength Comments: 2/5 throughout except 0/5 ankle DF and PF  See FIM for current functional status Refer to Care Plan for Long Term Goals  Recommendations for other services: Neuropsych  Discharge Criteria: Patient will be discharged from PT if patient refuses treatment 3 consecutive times without medical reason, if treatment goals not met, if there is a change in medical status, if patient makes no progress towards goals or if patient is discharged from hospital.  The above assessment, treatment plan, treatment alternatives and goals were discussed and mutually agreed upon: by patient  Edman Circle Faucette 10/12/2011, 10:27 AM

## 2011-10-12 NOTE — Care Management Note (Signed)
[facility]patient [facility] Individual Statement of Services  Patient Name:  LARENA OHNEMUS  Date:  10/12/2011  Welcome to the [facility]patient [facility].  Our goal is to provide you with an individualized program based on your diagnosis and situation, designed to meet your specific needs.  With this comprehensive rehabilitation program, you will be expected to participate in at least 3 hours of rehabilitation therapies Monday-Friday, with modified therapy programming on the weekends.  Your rehabilitation program will include the following services:  Physical Therapy (PT), Occupational Therapy (OT), Speech Therapy (ST), 24 hour per day rehabilitation nursing, Therapeutic Recreaction (TR), Neuropsychology, Case Management (RN and Social Worker), Rehabilitation Medicine, Nutrition Services and Pharmacy Services  Weekly team conferences will be held on Wednesday to discuss your progress.  Your RN Case Designer, television/film set will talk with you frequently to get your input and to update you on team discussions.  Team conferences with you and your family in attendance may also be held.  Expected length of stay: 7-10 days Overall anticipated outcome: Supervision/min level  Depending on your progress and recovery, your program may change.  Your RN Case Estate agent will coordinate services and will keep you informed of any changes.  Your RN Sports coach and SW names and contact numbers are listed  below.  The following services may also be recommended but are not provided by the Inpatient Rehabilitation Center:   Driving Evaluations  Home Health Rehabiltiation Services  Outpatient Rehabilitatation [facility]  Vocational Rehabilitation   Arrangements will be made to provide these services after discharge if needed.  Arrangements include referral to agencies that provide these services.  Your insurance has been verified to be:  Medicare and Medicaid Your primary doctor is:   Dr Sharyn Lull  Pertinent information will be shared with your doctor and your insurance company.   Social Worker:  Dossie Der, [place] [phone]  Information discussed with and copy given to patient by: Lucy Chris, 10/12/2011, 9:44 AM

## 2011-10-12 NOTE — Progress Notes (Signed)
Patient ID: MICHALINE KINDIG, female   DOB: Apr 07, 1945, 66 y.o.   MRN: [id number]  Subjective/Complaints: Refused blood draw, agitated about not being able to fasten fastener  Objective: Vital Signs: Blood pressure 117/83, pulse 53, temperature 97.5 F (36.4 C), temperature source Oral, resp. rate 18, height 5\' 1"  (1.549 m), weight 39.9 kg (87 lb 15.4 oz), SpO2 95.00%. No results found. Results for orders placed during the hospital encounter of 10/06/11 (from the past 72 hour(s))  CBC     Status: Abnormal   Collection Time   10/09/11  8:00 AM      Component Value Range Comment   WBC 3.5 (*) 4.0 - 10.5 K/uL    RBC 4.37  3.87 - 5.11 MIL/uL    Hemoglobin 13.2  12.0 - 15.0 g/dL    HCT 40.9  81.1 - 91.4 %    MCV 92.0  78.0 - 100.0 fL    MCH 30.2  26.0 - 34.0 pg    MCHC 32.8  30.0 - 36.0 g/dL    RDW 78.2  95.6 - 21.3 %    Platelets 172  150 - 400 K/uL   BASIC METABOLIC PANEL     Status: Abnormal   Collection Time   10/09/11  8:00 AM      Component Value Range Comment   Sodium 139  135 - 145 mEq/L    Potassium 4.4  3.5 - 5.1 mEq/L    Chloride 105  96 - 112 mEq/L    CO2 24  19 - 32 mEq/L    Glucose, Bld 79  70 - 99 mg/dL    BUN 20  6 - 23 mg/dL    Creatinine, Ser 0.86 (*) 0.50 - 1.10 mg/dL    Calcium 9.7  8.4 - 57.8 mg/dL    GFR calc non Af Amer 48 (*) >90 mL/min    GFR calc Af Amer 56 (*) >90 mL/min      HEENT: normal Cardio: RRR Resp: CTA B/L GI: BS positive Extremity:  No Edema Skin:   Intact Neuro: Agitated, Abnormal Sensory reduced on Left side, Abnormal Motor 4/5 L Delt Bi Tri Grip, HF,KE,ADF, Abnormal FMC Ataxic/ dec FMC and Apraxic Musc/Skel:  Normal   Assessment/Plan: 1. Functional deficits secondary to R Parieto-occipital thrombotic infarct which require 3+ hours per day of interdisciplinary therapy in a comprehensive inpatient rehab setting. Physiatrist is providing close team supervision and 24 hour management of active medical problems listed below. Physiatrist  and rehab team continue to assess barriers to discharge/monitor patient progress toward functional and medical goals. FIM:                                 Medical Problem List and Plan:  1. Right MCA thrombotic infarction  2. DVT Prophylaxis/Anticoagulation: Subcutaneous heparin. Monitor platelet counts any signs of bleeding  3. Mood: History of anxiety as well as schizophrenia. Continue Zyprexa 2.5 mg daily as well as Seroquel 50 mg at bedtime. Provide emotional support and positive reinforcement  4. Neuropsych: This patient is capable of making decisions on his/her own behalf.  5. Hypertension. Coreg 3.125 mg twice a day, Lasix 40 mg daily. Monitor with increased mobility  6. Asthma. Symbicort twice a day, Spiriva daily, Xopenex every 6 hours as needed. Patient currently denies any shortness of breath. Check oxygen saturations every shift  7. 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 Continue aspirin daily. Patient denies  any chest pain- check weights, follow i's and o's.  8. CHF. Continue Lasix 40 mg daily. Monitor for any signs of fluid overload  9. Hyperlipidemia. Zocor  10. GERD. Pepcid  10. History of polysubstance abuse. Latest urine drug screen negative    LOS (Days) 1 A FACE TO FACE EVALUATION WAS PERFORMED  Billiejean Schimek E 10/12/2011, 7:57 AM

## 2011-10-12 NOTE — Evaluation (Signed)
Speech Language Pathology Assessment and Plan  Patient Details  Name: [name] MRN: [id number] Date of Birth: [birth year]  SLP Diagnosis: Cognitive Impairments;Dysarthria  Rehab Potential: Fair ELOS: 1-2 weeks   Today's Date: 10/12/2011 Time: 1430-1520 Time Calculation (min): 50 min  Problem List:  Patient Active Problem List  Diagnosis  . HYPERLIPIDEMIA  . SCHIZOPHRENIA  . ANXIETY  . COCAINE ABUSE  . HYPERTENSION, BENIGN ESSENTIAL  . MYOCARDIAL INFARCTION  . CAD  . CHF  . Cerebral embolism with cerebral infarction  . Aneurysm of other specified artery  . ASTHMA  . COPD  . CONSTIPATION  . CHOLELITHIASIS  . RENAL FAILURE, ACUTE  . LIVER FUNCTION TESTS, ABNORMAL, HX OF  . Trouble swallowing  . Breast cancer, stage 2  . Hypertension  . Breast cancer  . Arthralgia  . Chest pain at rest  . CVA (cerebral infarction)   Past Medical History:  Past Medical History  Diagnosis Date  . Weight loss, unintentional   . Trouble swallowing   . Change in voice   . Substance abuse   . Dementia   . Asthma   . Shortness of breath   . Seizures     "    It has been along time "  . CHF (congestive heart failure)   . Headache   . Hypertension   . Arthralgia   . Tremors of nervous system   . Anxiety   . Rib fractures     hx of May 2012  . Heart attack 05/21/10  . Schizophrenia   . Coronary artery disease   . Stroke 07/11/10    hx of R CVA   . COPD (chronic obstructive pulmonary disease)   . Obstructive sleep apnea   . Breast cancer 01/28/11    L breast, inv ductal/in situ, ER/PR -, Her2 -   Past Surgical History:  Past Surgical History  Procedure Date  . Total abdominal hysterectomy age 71    Assessment / Plan / Recommendation Clinical Impression  [name] is a 66 y.o. right-handed African American female multi-medical with history of coronary artery disease , myocardial infarction secondary to cocaine abuse 05/21/2010 , nonischemic dilated  cardiomyopathy, hypertension as well as congestive heart failure and history of stroke with left hemiparesthesias 07/11/2010. Admitted 10/06/2011 with altered mental status and slurred speech. MRI of the brain showed acute right MCA posterior division infarct with petechial hemorrhage and mild mass effect. Also chronic left MCA, right ACA and left PCA infarcts.  Patient was felt to be a candidate for inpatient rehabilitation services and was admitted for a comprehensive rehabilitation program 10/12/11.  Upon evaluation today patient presents with impaired sustained attention, memory, awareness of deficits and problem solving.  Patient also exhibits decreased speech intelligibility.  Given that prior to admission patient lived alone in a one level apartment it is recommended that she receive skilled SLP services to address overall cognition and speech intelligibility.  Goals will target increased functional independence and reduce burden of care upon discharge.  However, SLP anticipates a need for 24/7 supervision upon discharge to ensure patient's safety.      SLP Assessment  Patient will need skilled Speech Lanaguage Pathology Services during CIR admission    Recommendations  Follow up Recommendations: 24 hour supervision/assistance;Home Health SLP Equipment Recommended: None recommended by SLP    SLP Frequency 1-2 X/day, 30-60 minutes;5 out of 7 days   SLP Treatment/Interventions Cognitive remediation/compensation;Cueing hierarchy;Environmental controls;Functional tasks;Internal/external aids;Patient/family education;Speech/Language facilitation;Therapeutic Activities  Pain Pain Assessment Pain Assessment: No/denies pain Prior Functioning Cognitive/Linguistic Baseline: Baseline deficits Baseline deficit details: mild speech and cognitive deficits from previous CVA  Short Term Goals: Week 1: SLP Short Term Goal 1 (Week 1): Patient will demonstrate orientation to new situation with mod assist  semantic cues  SLP Short Term Goal 2 (Week 1): Patient will request help as needed during basic, functional tasks with mod assist semantic cues  SLP Short Term Goal 3 (Week 1): Patient will demonstrate basic problem solving with familiar tasks with mod assist multi-modal cues SLP Short Term Goal 4 (Week 1): Patient will increase speech intelligibility at the sentence level of verbal expression with mod assist verbal   See FIM for current functional status Refer to Care Plan for Long Term Goals  Recommendations for other services: None  Discharge Criteria: Patient will be discharged from SLP if patient refuses treatment 3 consecutive times without medical reason, if treatment goals not met, if there is a change in medical status, if patient makes no progress towards goals or if patient is discharged from hospital.  The above assessment, treatment plan, treatment alternatives and goals were discussed and mutually agreed upon: by patient  [name], M.A., CCC-SLP [phone]  [name] 10/12/2011, 5:30 PM

## 2011-10-12 NOTE — Discharge Summary (Signed)
NAMESENOVIA, GAUER NO.:  [id number]  MEDICAL RECORD NO.:  [id number]  LOCATION:  [id number]                        FACILITY:  [facility]  PHYSICIAN:  Eduardo Osier. Sharyn Lull, M.D. DATE OF BIRTH:  Dec 26, 1945  DATE OF ADMISSION:  10/06/2011 DATE OF DISCHARGE:  10/11/2011                              DISCHARGE SUMMARY   ADMITTING DIAGNOSES: 1. Acute right parietal, cortical and subcortical infarct, rule out     cardioembolic cerebrovascular accident. 2. 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 3. Nonischemic dilated cardiomyopathy. 4. 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 5. Hypertension. 6. Chronic obstructive pulmonary disease. 7. Tobacco abuse. 8. Hypercholesteremia. 9. History of schizophrenia. 10.History of mild renal insufficiency in the past. 11.History of cancer of the breast. 12.History of polysubstance abuse. 13.History of cocaine abuse in the past.  DISCHARGE DIAGNOSES: 1. Status post acute right middle cerebral artery infarct with     petechial hemorrhage and mild mass effect. 2. 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 3. Nonischemic dilated cardiomyopathy. 4. 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 5. Hypertension. 6. Chronic obstructive pulmonary disease. 7. Tobacco abuse. 8. Hypercholesteremia. 9. History of dementia. 10.History of schizophrenia. 11.History of mild renal insufficiency. 12.History of cancer of the breast. 13.History of polysubstance abuse. 14.History of cocaine abuse. 15.Cachexia.  DISCHARGE MEDICATIONS: 1. Enteric-coated aspirin 81 mg 1 tablet daily. 2. Clopidogrel 75 mg 1 tablet daily. 3. Carvedilol 3.125 mg 1 tablet twice  daily. 4. Pepcid 20 mg 1 tablet twice daily. 5. Lasix 40 mg 1 tablet daily. 6. Potassium chloride 10 mEq 1 tablet daily. 7. Simvastatin 20 mg 1 tablet daily. 8. Symbicort 2 puffs twice daily. 9. Nitrostat 0.4 mg sublingual q.5 minutes as directed. 10.Zyprexa 2.5 mg 1 tablet daily at bedtime. 11.Proventil inhaler 2 puffs every 6 hours as needed for wheezing. 12.Spiriva 1 capsule 18 mcg into inhaler daily.  DIET:  Low salt, low cholesterol.  ACTIVITY:  Increase activity with assistance as tolerated in the Rehab.  CONDITION AT DISCHARGE:  Stable.  BRIEF HISTORY AND HOSPITAL COURSE:  [name] is 66 year old female with past medical history significant for multiple medical problems, i.e., history of non-Q-wave myocardial infarction in the past secondary to vasospasm, secondary to cocaine abuse; nonischemic dilated cardiomyopathy; hypertension; history of recurrent systolic congestive heart failure; COPD; hypercholesteremia; history of CA of the breast in the past; hypercholesteremia; history of polysubstance abuse, history of CVA in the past with left paresis; dementia; history of schizophrenia, she came to the ER by EMS as the patient's family noted the patient to be confused and had slurred speech and noted to have left-sided weakness.  The patient had CT of the brain in the ER, which showed new acute right parietal, cortical and subcortical infarct.  The patient was out of window for thrombolytic therapy.  The patient denies any chest pain, shortness of breath, nausea, vomiting.  Denies  any cough, fever, chills.  Denies any urinary complaints.  The patient was more awake when seen in the ER, only complaint was inability to lift her left leg.  PAST MEDICAL HISTORY:  As above.  PHYSICAL EXAMINATION:  VITAL SIGNS:  Her blood pressure was 125/98, pulse was 57.  She was afebrile. GENERAL:  She was alert and oriented to place, time and person. HEENT:  Head was normocephalic,  atraumatic.  Nose was normal.  Mouth and throat; no oropharyngeal exudate.  Conjunctiva was normal.  Pupils were equal, round, and reacting to light.  Sclera was nonicteric. NECK:  Supple.  No JVD.  No bruit. CARDIOVASCULAR:  Normal rate and rhythm.  S1, S2 was soft.  There was soft systolic murmur noted.  No S3 gallop. LUNGS:  Decreased breath sounds at bases.  No wheezing. ABDOMEN:  Soft.  Bowel sounds were present.  Nontender. EXTREMITIES:  There was no clubbing, cyanosis, or edema. NEUROLOGIC:  She was alert, awake, and oriented x3.  II-XII cranial nerves were grossly intact.  Motor was 4/5 in left lower extremity and 4+/5 in left upper extremity.  Right upper extremities were 5/5. Plantars were upgoing.  LABORATORY DATA:  Sodium was 141, potassium 3.2, BUN 10, creatinine 1.01.  Hemoglobin was 15.3, hematocrit 45.9, white count of 4.9. Cholesterol was 213, LDL was 98, triglycerides were 73, HDL was high at 100.  Repeat electrolytes; sodium was 139, potassium 4.4, BUN 20, creatinine 1.16, her glucose was 79.  Hemoglobin was 13.2, hematocrit 40.2, white count of 3.5.  MRI and MRA of the brain showed confluent acute right MCA posterior division infarct with petechial hemorrhage and mild mass effect, and chronic left MCA, right ACA and left PCA infarct with progression of this ischemic disease since 2011.  MRA of the brain showed no major circle of Willis branch occlusion.  Major right MCA branch are stable since 2011.  Interval attenuated flow in the right ACA branches, increase atherosclerosis of distal vertebral arteries, greater on the right and resulting in stenosis of the distal nondominant right vertebral artery.  BRIEF HOSPITAL COURSE:  The patient was admitted to telemetry unit. Neuro consultation was obtained in the ER.  The patient was felt not the candidate for thrombolytic therapy as the patient arrived late to the hospital.  The patient was continued on her home  medications and Plavix was added.  The patient motor strength actually improved to 4+ in lower extremities and normalized in left upper extremity.  OT/PT consultation was obtained and also Rehab consultation was obtained.  The patient is more awake.  Her slurred speech is markedly improved.  The patient will be transferred to inpatient rehab.  Will be followed up by me in 2 weeks and Neurology in 1-2 weeks as outpatient.     Eduardo Osier. Sharyn Lull, M.D.     [facility]  D:  10/11/2011  T:  10/12/2011  Job:  [id number]

## 2011-10-12 NOTE — Evaluation (Signed)
Occupational Therapy Assessment and Plan  Patient Details  Name: [name] MRN: [id number] Date of Birth: 01-Jun-1945  OT Diagnosis: abnormal posture, altered mental status, cognitive deficits, disturbance of vision, hemiplegia affecting non-dominant side and muscle weakness (generalized) Rehab Potential: Rehab Potential: Fair ELOS: 12-14 days   Today's Date: 10/12/2011 Time: 0800-0900 Time Calculation (min): 60 min  Problem List:  Patient Active Problem List  Diagnosis  . HYPERLIPIDEMIA  . SCHIZOPHRENIA  . ANXIETY  . COCAINE ABUSE  . HYPERTENSION, BENIGN ESSENTIAL  . MYOCARDIAL INFARCTION  . CAD  . CHF  . Cerebral embolism with cerebral infarction  . Aneurysm of other specified artery  . ASTHMA  . COPD  . CONSTIPATION  . CHOLELITHIASIS  . RENAL FAILURE, ACUTE  . LIVER FUNCTION TESTS, ABNORMAL, HX OF  . Trouble swallowing  . Breast cancer, stage 2  . Hypertension  . Breast cancer  . Arthralgia  . Chest pain at rest  . CVA (cerebral infarction)    Past Medical History:  Past Medical History  Diagnosis Date  . Weight loss, unintentional   . Trouble swallowing   . Change in voice   . Substance abuse   . Dementia   . Asthma   . Shortness of breath   . Seizures     "    It has been along time "  . CHF (congestive heart failure)   . Headache   . Hypertension   . Arthralgia   . Tremors of nervous system   . Anxiety   . Rib fractures     hx of May 2012  . Heart attack 05/21/10  . Schizophrenia   . Coronary artery disease   . Stroke 07/11/10    hx of R CVA   . COPD (chronic obstructive pulmonary disease)   . Obstructive sleep apnea   . Breast cancer 01/28/11    L breast, inv ductal/in situ, ER/PR -, Her2 -   Past Surgical History:  Past Surgical History  Procedure Date  . Total abdominal hysterectomy age 56    Assessment & Plan Clinical Impression: Patient is a 66 y.o. right-handed African American female multi-medical with history of  Schizophrenia, dementia, coronary artery disease , myocardial infarction secondary to cocaine abuse 05/21/2010 , nonischemic dilated cardiomyopathy, hypertension as well as congestive heart failure and history of stroke with left hemiparesthesias 07/11/2010. Admitted 10/06/2011 with altered mental status and slurred speech. MRI of the brain showed acute right MCA posterior division infarct with petechial hemorrhage and mild mass effect. Also chronic left MCA, right ACA and left PCA infarcts. MRA of the head with no stenosis. Echocardiogram with ejection fraction of 15% with diffuse hypokinesis no cardiac source of embolism. Carotid Dopplers with no obvious evidence of significant ICA stenosis. Patient did not receive TPA. Maintained on aspirin and Plavix therapy for CVA prophylaxis. Subcutaneous heparin added for DVT prophylaxis. Venous Doppler studies lower extremities 10/10/2011 negative for DVT.  Patient transferred to CIR on 10/11/2011 .    Patient currently requires mod-min assist with basic self-care skills and IADL secondary to muscle weakness, decreased coordination, decreased visual perceptual skills and visual field cut vs. inattention, decreased midline orientation and decreased attention to left, decreased attention, decreased awareness, decreased problem solving, decreased safety awareness and decreased memory and decreased standing balance, decreased postural control, hemiplegia and decreased balance strategies.  Patient is not a good historian regarding current or past information and family not available.  PTA patient lived alone and her niece and  sister assisted, just sure how much.  Patient will benefit from skilled intervention to decrease level of assist with basic self-care skills prior to discharge home with care partner (per patient, her niece will assist her).  Anticipate patient will require 24 hour supervision and minimal physical assistance and follow up home health.  OT - End of  Session Endurance Deficit: Yes Endurance Deficit Description: poor activity tolerance OT Assessment Rehab Potential: Fair Barriers to Discharge:  (Question caregiver support) OT Plan OT Frequency: 1-2 X/day, 60-90 minutes Estimated Length of Stay: 12-14 days OT Treatment/Interventions: Balance/vestibular training;Cognitive remediation/compensation;Discharge planning;DME/adaptive equipment instruction;Disease mangement/prevention;Patient/family education;Neuromuscular re-education;Psychosocial support;Self Care/advanced ADL retraining;Therapeutic Exercise;Therapeutic Activities;UE/LE Strength taining/ROM;UE/LE Coordination activities;Visual/perceptual remediation/compensation OT Recommendation Follow Up Recommendations: Home health OT  OT Evaluation Precautions/Restrictions  Precautions Precautions: Fall Precaution Comments: Impulsive and unsafe,  significant visual field impairments difficult to assess formally-has limitations in all visual fields functionally and L inattention vs. l visual field cut, CHF, COPD, CAD, h/o MI, cocaine abuse, Schizophrenia, dementia Pain Pain Assessment Pain Assessment: No/denies pain Home Living/Prior Functioning Home Living Lives With: Alone Available Help at Discharge: Family (Patient poor historian, maybe 24/7) Type of Home: Apartment Home Access: Stairs to enter Entrance Stairs-Number of Steps: 1 Home Layout: One level Bathroom Shower/Tub: Engineer, manufacturing systems: Standard Home Adaptive Equipment: Walker - rolling;Shower chair with back;Bedside commode/3-in-1 Additional Comments: Need to verify who will provide 24/7 Prior Function Level of Independence: Requires assistive device for independence;Needs assistance with homemaking;Independent with transfers;Independent with gait Able to Take Stairs?: Yes Driving: No Vision/Perception  Vision - History Baseline Vision: Wears glasses only for reading (per patient) Visual History:   (difficult to get correct info from patient) Patient Visual Report: Diplopia (occurs occasionally, pt. reports it has gotten better) Vision - Assessment Alignment/Gaze Preference: Gaze right Tracking/Visual Pursuits: Decreased smoothness of horizontal tracking;Impaired - to be further tested in functional context (initially patient didn't following moving object at all) Saccades: Impaired - to be further tested in functional context Visual Fields: Impaired - to be further tested in functional context;Left visual field deficit Additional Comments: Patient also with visual field deficits in right, superior and inferior fields. Perception Perception: Impaired Inattention/Neglect: Does not attend to left visual field;Does not attend to left side of body;Impaired-to be further tested in functional context  Cognition Overall Cognitive Status: Impaired Arousal/Alertness: Awake/alert Orientation Level: Oriented to person;Disoriented to place;Disoriented to time;Disoriented to situation Focused Attention: Appears intact Sustained Attention: Impaired Sustained Attention Impairment: Verbal basic;Functional basic Memory: Impaired Memory Impairment: Storage deficit;Retrieval deficit;Decreased recall of new information;Decreased long term memory;Decreased short term memory Decreased Long Term Memory: Verbal basic;Functional basic Decreased Short Term Memory: Verbal basic;Functional basic Awareness: Impaired Awareness Impairment: Intellectual impairment Problem Solving: Impaired Problem Solving Impairment: Verbal basic;Functional basic Executive Function:  (all impaired given lower level deficits) Safety/Judgment: Impaired Sensation Sensation Light Touch: Impaired Detail Light Touch Impaired Details: Impaired LLE;Impaired LUE Stereognosis: Not tested Hot/Cold: Not tested Proprioception: Not tested Coordination Gross Motor Movements are Fluid and Coordinated: No Fine Motor Movements are Fluid  and Coordinated: No Coordination and Movement Description: Limited by weakness Motor  Motor Motor: Hemiplegia;Abnormal postural alignment and control Motor - Skilled Clinical Observations: L hemiplegia; in standing presents with strong posterior and R lateral lean Trunk/Postural Assessment  Postural Control Postural Control: Deficits on evaluation (In standing presents with R, posterior lean/push)  Balance Static Sitting Balance Static Sitting - Balance Support: Feet supported Static Sitting - Level of Assistance: 5: Stand by assistance Dynamic Sitting Balance Dynamic Sitting - Balance  Support: Feet supported Dynamic Sitting - Level of Assistance: 5: Stand by assistance Static Standing Balance Static Standing - Balance Support: Left upper extremity supported Static Standing - Level of Assistance: 2: Max assist Dynamic Standing Balance Dynamic Standing - Balance Support: Left upper extremity supported Dynamic Standing - Level of Assistance: 2: Max assist;1: +1 Total assist Extremity/Trunk Assessment RUE Assessment RUE Assessment: Within Functional Limits (grossly WFL with BADL, arthritic deformity in fingers) LUE Assessment LUE Assessment: Exceptions to [facility] (arthritic deformity noted in fingers) LUE AROM (degrees) LUE Overall AROM Comments: Grossly WFL except shoulder flexion ~90 degrees LUE Strength LUE Overall Strength Comments: 3+/5 proximally, 4/5 distally  See FIM for current functional status Refer to Care Plan for Long Term Goals  Recommendations for other services: Neuropsych  Discharge Criteria: Patient will be discharged from OT if patient refuses treatment 3 consecutive times without medical reason, if treatment goals not met, if there is a change in medical status, if patient makes no progress towards goals or if patient is discharged from hospital.  The above assessment, treatment plan, treatment alternatives and goals were discussed and mutually agreed upon: No  family available/patient unable  Natlie Asfour 10/12/2011, 5:28 PM

## 2011-10-12 NOTE — Progress Notes (Signed)
Patient information reviewed and entered into [facility] system by Tora Duck, RN, CRRN, PPS Coordinator.  Information including medical coding and functional independence measure will be reviewed and updated through discharge.   "Data Collection Information Summary for Patients in Inpatient Rehabilitation Facilities with attached "Privacy Act Statement-Health Care Records" left in patients room for family.  Patient unable to comprehend education upon admission and no family present per nursing.

## 2011-10-12 NOTE — ED Provider Notes (Signed)
I saw and evaluated the patient, reviewed the resident's note and I agree with the findings and plan.   Patient presented wit AMS but by radiology studies ended have CVA. Symptom onset most likely yesterday or at least last seen normal yesterday, not TPA candidate. Stable now will need admission by hospitalist and neurology consult.   Shelda Jakes, MD 10/12/11 2129

## 2011-10-13 ENCOUNTER — Inpatient Hospital Stay (HOSPITAL_COMMUNITY): Payer: Medicare Other | Admitting: Speech Pathology

## 2011-10-13 ENCOUNTER — Inpatient Hospital Stay (HOSPITAL_COMMUNITY): Payer: Medicare Other

## 2011-10-13 ENCOUNTER — Inpatient Hospital Stay (HOSPITAL_COMMUNITY): Payer: Medicare Other | Admitting: Occupational Therapy

## 2011-10-13 DIAGNOSIS — Z5189 Encounter for other specified aftercare: Secondary | ICD-10-CM

## 2011-10-13 DIAGNOSIS — I6330 Cerebral infarction due to thrombosis of unspecified cerebral artery: Secondary | ICD-10-CM

## 2011-10-13 NOTE — Progress Notes (Signed)
Occupational Therapy Note/[employer]  Patient Details  Name: [name] MRN: [id number] Date of Birth: [birth year] Today's Date: 10/13/2011 Time: 1130-1145 (15 min)  Pt seen for group session, [employer], with focus on self-feeding and attention to task.  Pt in extremely bad mood upon arrival and reported no desire to participate in group session at this time.  Pt was encouraged to engage with group and attempt to eat some.  Pt remained in poor spirits and complained of many environmental things.  Pt with limited engagement in group, asking to return to bed.  Pt with no c/o pain this session.   Leonette Monarch 10/13/2011, 1:17 PM

## 2011-10-13 NOTE — Progress Notes (Signed)
Speech Language Pathology Daily Session Note  Patient Details  Name: [name] MRN: [id number] Date of Birth: [birth year]  Today's Date: 10/13/2011 Time: 0830-0900 Time Calculation (min): 30 min  Short Term Goals: Week 1: SLP Short Term Goal 1 (Week 1): Patient will demonstrate orientation to new situation with mod assist semantic cues  SLP Short Term Goal 2 (Week 1): Patient will request help as needed during basic, functional tasks with mod assist semantic cues  SLP Short Term Goal 3 (Week 1): Patient will demonstrate basic problem solving with familiar tasks with mod assist multi-modal cues SLP Short Term Goal 4 (Week 1): Patient will increase speech intelligibility at the sentence level of verbal expression with mod assist verbal   Skilled Therapeutic Interventions: Treatment session focused on addressing cognition.  SLP facilitated session with min-mod assist verbal and tactile cues to sequence and problem solve given new visual deficits during sink level grooming tasks.  Patient with increased orientation to place and situation however continued to required mod-max cues to relate CVA with changes in her left hand and leg and as a result did not initiate requests for help without cues.      FIM:  Comprehension Comprehension Mode: Auditory Comprehension: 4-Understands basic 75 - 89% of the time/requires cueing 10 - 24% of the time Expression Expression Mode: Verbal Expression: 4-Expresses basic 75 - 89% of the time/requires cueing 10 - 24% of the time. Needs helper to occlude trach/needs to repeat words. Social Interaction Social Interaction: 4-Interacts appropriately 75 - 89% of the time - Needs redirection for appropriate language or to initiate interaction. Problem Solving Problem Solving: 3-Solves basic 50 - 74% of the time/requires cueing 25 - 49% of the time Memory Memory: 2-Recognizes or recalls 25 - 49% of the time/requires cueing 51 - 75% of the time  Pain Pain  Assessment Pain Assessment: 0-10 Pain Score:   8 Pain Type: Acute pain Pain Location: Leg Pain Orientation: Left Pain Descriptors: Aching Pain Onset: Gradual Pain Intervention(s): RN made aware Multiple Pain Sites: No  Therapy/Group: Individual Therapy  Charlane Ferretti., CCC-SLP [phone]  Srishti Strnad 10/13/2011, 10:43 AM

## 2011-10-13 NOTE — Progress Notes (Signed)
Speech Language Pathology Daily Session Note  Patient Details  Name: [name] MRN: [id number] Date of Birth: May 08, 1945  Today's Date: 10/13/2011 Time: 4098-1191 Time Calculation (min): 10 min  Short Term Goals: Week 1: SLP Short Term Goal 1 (Week 1): Patient will demonstrate orientation to new situation with mod assist semantic cues  SLP Short Term Goal 2 (Week 1): Patient will request help as needed during basic, functional tasks with mod assist semantic cues  SLP Short Term Goal 3 (Week 1): Patient will demonstrate basic problem solving with familiar tasks with mod assist multi-modal cues SLP Short Term Goal 4 (Week 1): Patient will increase speech intelligibility at the sentence level of verbal expression with mod assist verbal   Skilled Therapeutic Interventions: Co-treatment with OT; SLP facilitated session with set up of snack since lunch had not arrived.  Patient consumed regular textures with mod I use of thin liquid sips to assist in oral phase/mastication and transit of textures with no overt s/s of aspiration.  Patient with little interaction with peers at lunch; however, speech was overall supervision level.     FIM:  Comprehension Comprehension Mode: Auditory Comprehension: 4-Understands basic 75 - 89% of the time/requires cueing 10 - 24% of the time Expression Expression Mode: Verbal Expression: 4-Expresses basic 75 - 89% of the time/requires cueing 10 - 24% of the time. Needs helper to occlude trach/needs to repeat words. Social Interaction Social Interaction: 4-Interacts appropriately 75 - 89% of the time - Needs redirection for appropriate language or to initiate interaction. Problem Solving Problem Solving: 3-Solves basic 50 - 74% of the time/requires cueing 25 - 49% of the time Memory Memory: 2-Recognizes or recalls 25 - 49% of the time/requires cueing 51 - 75% of the time  Pain Pain Assessment Pain Assessment: No/denies pain  Therapy/Group: Group  Therapy  Charlane Ferretti., CCC-SLP [phone]  [name] 10/13/2011, 1:46 PM

## 2011-10-13 NOTE — Progress Notes (Signed)
Physical Therapy Session Note  Patient Details  Name: [name] MRN: [id number] Date of Birth: 07/26/1945  Today's Date: 10/13/2011 Time: 1300 Time Calculation (min):0      Skilled Therapeutic Interventions/Progress Updates: Pt sleeping in bed, and refused to get up or participate in therapy bedside.  Therapist re-approached pt 15 minutes later, but pt continued to refuse, stating she did not want to get up, just sleep.  Info conveyed to RN.     Therapy Documentation Precautions:  Precautions Precautions: Fall Precaution Comments: Impulsive and unsafe,  significant visual field impairments difficult to assess formally-has limitations in all visual fields functionally and L inattention vs. l visual field cut, CHF, COPD, CAD, h/o MI, cocaine abuse, Schizophrenia, dementia  Pain: Pain Assessment Pain Assessment: No/denies pain         See FIM for current functional status  Therapy/Group: Individual Therapy  Justn Quale 10/13/2011, 1:59 PM

## 2011-10-13 NOTE — Progress Notes (Signed)
Social Work Patient ID: [name], female   DOB: 07-05-1945, 66 y.o.   MRN: [id number]  Left a message for sister so can inform of team conference and tentative discharge date of 8/31. Met with pt who is tired and more lethargic, she reports did not sleep well last night.  Unsure if she Processed the information this worker gave to her regarding team conference and discharge.  Check Back later to see if pt more awake and feeling better.

## 2011-10-13 NOTE — Progress Notes (Signed)
Social Work Assessment and Plan Social Work Assessment and Plan  Patient Details  Name: [name] MRN: [id number] Date of Birth: 1945/09/07  Today's Date: 10/13/2011  Problem List:  Patient Active Problem List  Diagnosis  . HYPERLIPIDEMIA  . SCHIZOPHRENIA  . ANXIETY  . COCAINE ABUSE  . HYPERTENSION, BENIGN ESSENTIAL  . MYOCARDIAL INFARCTION  . CAD  . CHF  . Cerebral embolism with cerebral infarction  . Aneurysm of other specified artery  . ASTHMA  . COPD  . CONSTIPATION  . CHOLELITHIASIS  . RENAL FAILURE, ACUTE  . LIVER FUNCTION TESTS, ABNORMAL, HX OF  . Trouble swallowing  . Breast cancer, stage 2  . Hypertension  . Breast cancer  . Arthralgia  . Chest pain at rest  . CVA (cerebral infarction)   Past Medical History:  Past Medical History  Diagnosis Date  . Weight loss, unintentional   . Trouble swallowing   . Change in voice   . Substance abuse   . Dementia   . Asthma   . Shortness of breath   . Seizures     "    It has been along time "  . CHF (congestive heart failure)   . Headache   . Hypertension   . Arthralgia   . Tremors of nervous system   . Anxiety   . Rib fractures     hx of May 2012  . Heart attack 05/21/10  . Schizophrenia   . Coronary artery disease   . Stroke 07/11/10    hx of R CVA   . COPD (chronic obstructive pulmonary disease)   . Obstructive sleep apnea   . Breast cancer 01/28/11    L breast, inv ductal/in situ, ER/PR -, Her2 -   Past Surgical History:  Past Surgical History  Procedure Date  . Total abdominal hysterectomy age 82   Social History:  reports that she quit smoking about 3 years ago. Her smoking use included Cigarettes. She has a 50 pack-year smoking history. She has never used smokeless tobacco. She reports that she drinks alcohol. She reports that she uses illicit drugs.  Family / Support Systems Marital Status: Divorced Patient Roles: Parent;Other (Comment) (Sister and Aunt) Other Supports: Valentina Gu  [phone]  Adella Hare  [phone]  Bjorn Loser Miller-niece  [phone] Anticipated Caregiver: Bjorn Loser and Jonna Clark Ability/Limitations of Caregiver: Niece is in and out, not always there Caregiver Availability: Other (Comment) (Almost 24 hour, 1-2 hours not covered) Family Dynamics: Pt reports her family will make sure she has what she needs, they are there for her.  Bjorn Loser reports: " We will make sure she has what she needs."  Social History Preferred language: English Religion: Baptist Cultural Background: No issues Education: [employer] Read: Yes Write: Yes Employment Status: Disabled Fish farm manager Issues: No issues Guardian/Conservator: No formal POA-MD feels pt can make her own decisions, but will consult with family also   Abuse/Neglect Physical Abuse: Denies Verbal Abuse: Denies Sexual Abuse: Denies Exploitation of patient/patient's resources: Denies Self-Neglect: Denies  Emotional Status Pt's affect, behavior adn adjustment status: Pt is very vocal when she was here in 2012 she was very vocal and speaks her mind.  She is stubborn and wants to do things her way.  She does not alwyas recognize her deficits and that she is unsafe in her movements.  She has made some improvements, need to come to a common ground. Recent Psychosocial Issues: Multiple medical issues-she has managed though. Pyschiatric History: History-Schizophrenia takes  meds for, unsure if she is complaint.  Unable to do dperssion screen due to pt's refusal.  Felt unnecessary and what for? Substance Abuse History: History of cocaine abuse but has quit.  Quit when here in 2012 after suffering stroke.  Pt feels no issues with this now  Patient / Family Perceptions, Expectations & Goals Pt/Family understanding of illness & functional limitations: Pt has a basic knowledge of her stroke, recognizes she has some weakness and balance issues.  Niece has a good understanding of Aunt's  deficits.  She reports: " She has always come back from these." Premorbid pt/family roles/activities: Aunt, Sister, Mother, Retiree, etc Anticipated changes in roles/activities/participation: Resume Pt/family expectations/goals: Pt states: " I want to get out of here soon, making me do all this shit."  Niece states: " We will try to do what she needs but she does what she wants to do."  [employer]: None Premorbid Home Care/DME Agencies: Other (Comment) (has had HH in the past) Transportation available at discharge: Family provides Resource referrals recommended: Support group (specify) (CVA Support Group)  Discharge Planning Living Arrangements: Alone Support Systems: Children;Other relatives;Friends/neighbors Type of Residence: Private residence Insurance Resources: Medicare;Medicaid (specify county) Medical sales representative) Financial Resources: SSI Financial Screen Referred: No Living Expenses: Psychologist, sport and exercise Management: Family Do you have any problems obtaining your medications?: No Home Management: Niece Patient/Family Preliminary Plans: Go back to her apt with fmaily coming in to assist her.  At times pt may be alone, Bjorn Loser is in and out.  They will work on 24 hour supervision. Social Work Anticipated Follow Up Needs: HH/OP;Support Group DC Planning Additional Notes/Comments: Pt can be difficult to work with and may or may not cooperate with team, need to get buy-in from her.  Clinical Impression Challenging lady who is set in her ways and wants things done her way.  She is participating in therapies, understands balance and weakness issues.  Family will try to provide supervision but may Not be able too.  Pt plans to return no matter what.    Lucy Chris 10/13/2011, 12:06 PM

## 2011-10-13 NOTE — Progress Notes (Signed)
Occupational Therapy Session Note  Patient Details  Name: [name] MRN: [id number] Date of Birth: [birth year]  Today's Date: 10/13/2011 Time: 0900-1000 Time Calculation (min): 60 min  Short Term Goals: Week 1:  OT Short Term Goal 1 (Week 1): Shower: Min assist sit and stand OT Short Term Goal 2 (Week 1): LB Dressing: Min assist to include sit and stand OT Short Term Goal 3 (Week 1): Toilet Transfer: Min assist OT Short Term Goal 4 (Week 1): Continue to assess and treat Visual Deficits  Skilled Therapeutic Interventions:  Upon arrival, patient in w/c at sink and finishing her therapy session with SLP.  Self care retraining to include shower, dress, groom and transfers.  Focus session on safe shower transfer to tub bench, encourage patient to attempt BADL tasks for herself yet when she becomes too frustrated, she will often allow this clinician to assist.  Visual scanning and attention to environment on her left as well as often unable to see items when held at midline in front of her or in her inferior visual field.  Patient donned wig after numerous attempts then combed it while it was on her head.  Ambulated to recliner and made patient comfortable while reviewing need for patient to drink and eat enough food to give her energy.  Patient said, "I don't want to be fat and I have been thin all my life"  Patient asking to go outside many times during session.  Patient insisted to get out of the recliner and go back to bed if she could not go outside.  Assisted patient back to bed, bed alarm turned on and call bell within reach.  Therapy Documentation Precautions:  Precautions Precautions: Fall Precaution Comments: Impulsive and unsafe,  significant visual field impairments difficult to assess formally-has limitations in all visual fields functionally and L inattention vs. l visual field cut, CHF, COPD, CAD, h/o MI, cocaine abuse, Schizophrenia, dementia Pain: Pain Assessment Pain  Assessment: No/denies pain  See FIM for current functional status  Therapy/Group: Individual Therapy  Zan Orlick 10/13/2011, 3:13 PM

## 2011-10-13 NOTE — Progress Notes (Signed)
Patient ID: [name], female   DOB: 19-Apr-1945, 66 y.o.   MRN: [id number]  Subjective/Complaints: Had a better night. Request for cup of coffee. No complaints today. A 12 point review of systems has been performed and if not noted above is otherwise negative.   Objective: Vital Signs: Blood pressure 102/72, pulse 54, temperature 97.9 F (36.6 C), temperature source Oral, resp. rate 18, height 5\' 1"  (1.549 m), weight 41.7 kg (91 lb 14.9 oz), SpO2 92.00%. No results found. Results for orders placed during the hospital encounter of 10/11/11 (from the past 72 hour(s))  CBC WITH DIFFERENTIAL     Status: Normal   Collection Time   10/12/11  9:13 AM      Component Value Range Comment   WBC 4.3  4.0 - 10.5 K/uL    RBC 4.96  3.87 - 5.11 MIL/uL    Hemoglobin 15.0  12.0 - 15.0 g/dL    HCT 40.9  81.1 - 91.4 %    MCV 91.9  78.0 - 100.0 fL    MCH 30.2  26.0 - 34.0 pg    MCHC 32.9  30.0 - 36.0 g/dL    RDW 78.2  95.6 - 21.3 %    Platelets 205  150 - 400 K/uL    Neutrophils Relative 60  43 - 77 %    Neutro Abs 2.6  1.7 - 7.7 K/uL    Lymphocytes Relative 26  12 - 46 %    Lymphs Abs 1.1  0.7 - 4.0 K/uL    Monocytes Relative 12  3 - 12 %    Monocytes Absolute 0.5  0.1 - 1.0 K/uL    Eosinophils Relative 2  0 - 5 %    Eosinophils Absolute 0.1  0.0 - 0.7 K/uL    Basophils Relative 1  0 - 1 %    Basophils Absolute 0.0  0.0 - 0.1 K/uL   COMPREHENSIVE METABOLIC PANEL     Status: Abnormal   Collection Time   10/12/11  9:13 AM      Component Value Range Comment   Sodium 140  135 - 145 mEq/L    Potassium 4.3  3.5 - 5.1 mEq/L    Chloride 102  96 - 112 mEq/L    CO2 26  19 - 32 mEq/L    Glucose, Bld 101 (*) 70 - 99 mg/dL    BUN 35 (*) 6 - 23 mg/dL    Creatinine, Ser 0.86 (*) 0.50 - 1.10 mg/dL    Calcium 57.8  8.4 - 10.5 mg/dL    Total Protein 7.9  6.0 - 8.3 g/dL    Albumin 3.8  3.5 - 5.2 g/dL    AST 43 (*) 0 - 37 U/L    ALT 20  0 - 35 U/L    Alkaline Phosphatase 90  39 - 117 U/L    Total  Bilirubin 0.2 (*) 0.3 - 1.2 mg/dL    GFR calc non Af Amer 38 (*) >90 mL/min    GFR calc Af Amer 44 (*) >90 mL/min      HEENT: normal Cardio: RRR Resp: CTA B/L GI: BS positive Extremity:  No Edema Skin:   Intact Neuro: Agitated, Abnormal Sensory reduced on Left side, Abnormal Motor 4/5 L Delt Bi Tri Grip, HF,KE,ADF, Abnormal FMC Ataxic/ dec FMC and Apraxic Musc/Skel:  Normal   Assessment/Plan: 1. Functional deficits secondary to R Parieto-occipital thrombotic infarct which require 3+ hours per day of interdisciplinary therapy in a comprehensive  inpatient rehab setting. Physiatrist is providing close team supervision and 24 hour management of active medical problems listed below. Physiatrist and rehab team continue to assess barriers to discharge/monitor patient progress toward functional and medical goals. FIM: FIM - Bathing Bathing Steps Patient Completed: Chest;Right Arm;Left Arm;Abdomen;Front perineal area;Buttocks;Right upper leg;Left upper leg;Right lower leg (including foot) Bathing: 4: Min-Patient completes 8-9 68f 10 parts or 75+ percent  FIM - Upper Body Dressing/Undressing Upper body dressing/undressing: 0: Wears gown/pajamas-no public clothing FIM - Lower Body Dressing/Undressing Lower body dressing/undressing steps patient completed: Don/Doff right sock;Don/Doff left sock Lower body dressing/undressing: 0: Wears gown/pajamas-no public clothing (able to don/doff R & L gripper sock)        FIM - Banker Devices: Arm rests Bed/Chair Transfer: 3: Bed > Chair or W/C: Mod A (lift or lower assist);3: Chair or W/C > Bed: Mod A (lift or lower assist)  FIM - Locomotion: Wheelchair Distance: 100 with cues for hand placement, UE propulsion sequence, and for attention to obstacles on L Locomotion: Wheelchair: 2: Travels 50 - 149 ft with moderate assistance (Pt: 50 - 74%) FIM - Locomotion: Ambulation Locomotion: Ambulation Assistive  Devices: Other (comment) (L HHA) Ambulation/Gait Assistance: 2: Max assist Locomotion: Ambulation: 2: Travels 50 - 149 ft with maximal assistance (Pt: 25 - 49%)  Comprehension Comprehension Mode: Auditory Comprehension: 3-Understands basic 50 - 74% of the time/requires cueing 25 - 50%  of the time  Expression Expression Mode: Verbal Expression: 4-Expresses basic 75 - 89% of the time/requires cueing 10 - 24% of the time. Needs helper to occlude trach/needs to repeat words.  Social Interaction Social Interaction: 3-Interacts appropriately 50 - 74% of the time - May be physically or verbally inappropriate.  Problem Solving Problem Solving: 3-Solves basic 50 - 74% of the time/requires cueing 25 - 49% of the time  Memory Memory: 2-Recognizes or recalls 25 - 49% of the time/requires cueing 51 - 75% of the time Medical Problem List and Plan:  1. Right MCA thrombotic infarction . Continue aspirin therapy 2. DVT Prophylaxis/Anticoagulation: Subcutaneous heparin. Monitor platelet counts any signs of bleeding  3. Mood: History of anxiety as well as schizophrenia. Continue Zyprexa 2.5 mg daily as well as Seroquel 50 mg at bedtime. Noted some bouts of agitation but improving. Needs some encouragement to participate with therapies. Provide emotional support and positive reinforcement  4. Neuropsych: This patient is capable of making decisions on his/her own behalf.  5. Hypertension. Coreg 3.125 mg twice a day, Lasix 40 mg daily. Monitor with increased mobility . 6. Asthma. Symbicort twice a day, Spiriva daily, Xopenex every 6 hours as needed. Patient currently denies any shortness of breath. Check oxygen saturations every shift. 7. 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 Continue aspirin daily. Patient denies any chest pain- check weights, follow i's and o's.  8. CHF. Continue Lasix 40 mg daily. Monitor for any signs of fluid overload . No chest pain or shortness of  breath noted. Admission labs were unremarkable  -recheck labs in the am. BUN and CR trending up yesterday- hold lasix for now (probably already received dose) 9. Hyperlipidemia. Zocor  10. GERD. Pepcid  10. History of polysubstance abuse. Latest urine drug screen negative    LOS (Days) 2 A FACE TO FACE EVALUATION WAS PERFORMED  ANGIULLI,DANIEL J. 10/13/2011, 6:50 AM

## 2011-10-13 NOTE — Patient Care Conference (Signed)
Inpatient RehabilitationTeam Conference Note Date: 10/12/2011   Time: 11:10 AM    Patient Name: [name]      Medical Record Number: [id number]  Date of Birth: 10/21/1945 Sex: Female         Room/Bed: 4028/4028-01 Payor Info: Payor: MEDICARE  Plan: MEDICARE PART A AND B  Product Type: *No Product type*     Admitting Diagnosis: R MCA CVA  Admit Date/Time:  10/11/2011  1:30 PM Admission Comments: No comment available   Primary Diagnosis:  CVA (cerebral infarction) Principal Problem: CVA (cerebral infarction)  Patient Active Problem List   Diagnosis Date Noted  . CVA (cerebral infarction) 10/11/2011  . Chest pain at rest 04/08/2011    Class: Acute  . Hypertension   . Arthralgia   . Breast cancer, stage 2 02/10/2011  . Trouble swallowing   . Breast cancer 01/28/2011  . CHF 12/25/2009  . Cerebral embolism with cerebral infarction 05/26/2009  . 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 LIVER FUNCTION TESTS, ABNORMAL, HX OF 05/14/2009  . CONSTIPATION 04/13/2009  . HYPERLIPIDEMIA 04/06/2009  . SCHIZOPHRENIA 04/06/2009  . ANXIETY 04/06/2009  . COCAINE ABUSE 04/06/2009  . HYPERTENSION, BENIGN ESSENTIAL 04/06/2009  . MYOCARDIAL INFARCTION 04/06/2009  . ASTHMA 04/06/2009  . COPD 04/06/2009  . CHOLELITHIASIS 04/06/2009  . RENAL FAILURE, ACUTE 04/06/2009  . CAD 03/31/2009    Expected Discharge Date: Expected Discharge Date: 10/22/11  Team Members Present: Physician: Dr. Claudette Laws Social Worker Present: Dossie Der, LCSW Nurse Present: Rosalio Macadamia, RN PT Present: Edman Circle, Judith Blonder, PTA OT Present: Leonette Monarch, Felipa Eth, OT SLP Present: Fae Pippin, SLP     Current Status/Progress Goal Weekly Team Focus  Medical   agitated, refusing therapy at times, history of schizophrenia as well as substance abuse  improved participation with therapy  medication management, adjustment to new unit   Bowel/Bladder             Swallow/Nutrition/  Hydration             ADL's     mod-max assist   supervision/min level   balance, transfers, safety, pt impulsive  Mobility   mod-max A overall  supervision-min A overall  activity tolerance, balance, gait, transfers, safety; patient with VERY low frustration tolerance, lots of refusals to participate   Communication   Eval pending         Safety/Cognition/ Behavioral Observations  Eval pending         Pain             Skin                *See Interdisciplinary Assessment and Plan and progress notes for long and short-term goals  Barriers to Discharge: poor safety awareness, left neglect    Possible Resolutions to Barriers:  continued work with PT and OT as well as speech therapy    Discharge Planning/Teaching Needs:    Return home with niece assisting with her care-at times may be alone.  Will talk with family about 24 hour supervision level     Team Discussion:  Pt has been on rehab before-low frustration tolerance, multiple medical issues-MD addressing. Working on pt's balance and left inattention  Revisions to Treatment Plan:  None   Continued Need for Acute Rehabilitation Level of Care: The patient requires daily medical management by a physician with specialized training in physical medicine and rehabilitation for the following conditions: Daily direction of a multidisciplinary physical rehabilitation program to  ensure safe treatment while eliciting the highest outcome that is of practical value to the patient.: Yes Daily analysis of laboratory values and/or radiology reports with any subsequent need for medication adjustment of medical intervention for : Neurological problems;Other  [name], [name] 10/14/2011, 8:45 AM

## 2011-10-14 ENCOUNTER — Inpatient Hospital Stay (HOSPITAL_COMMUNITY): Payer: Medicare Other | Admitting: Speech Pathology

## 2011-10-14 ENCOUNTER — Inpatient Hospital Stay (HOSPITAL_COMMUNITY): Payer: Medicare Other | Admitting: Occupational Therapy

## 2011-10-14 ENCOUNTER — Inpatient Hospital Stay (HOSPITAL_COMMUNITY): Payer: Medicare Other | Admitting: Physical Therapy

## 2011-10-14 ENCOUNTER — Inpatient Hospital Stay (HOSPITAL_COMMUNITY): Payer: Medicare Other

## 2011-10-14 LAB — BASIC METABOLIC PANEL
BUN: 37 mg/dL — ABNORMAL HIGH (ref 6–23)
BUN: 39 mg/dL — ABNORMAL HIGH (ref 6–23)
Calcium: 10.2 mg/dL (ref 8.4–10.5)
Chloride: 98 mEq/L (ref 96–112)
Creatinine, Ser: 1.51 mg/dL — ABNORMAL HIGH (ref 0.50–1.10)
GFR calc Af Amer: 40 mL/min — ABNORMAL LOW (ref >90)
GFR calc non Af Amer: 41 mL/min — ABNORMAL LOW (ref >90)
Glucose, Bld: 81 mg/dL (ref 70–99)
Glucose, Bld: 100 mg/dL — ABNORMAL HIGH (ref 70–99)

## 2011-10-14 NOTE — Progress Notes (Signed)
Physical Therapy Session Note  Patient Details  Name: [name] MRN: [id number] Date of Birth: Feb 08, 1946  Today's Date: 10/14/2011 Time: 1100-1200 and 4098-1191 Time Calculation (min): 60 min and 29 min  Short Term Goals: Week 1: LTGs    Skilled Therapeutic Interventions/Progress Updates:   AM-  Gait training x 40', x 190' with focus on wt shifting to LLW, = step lengths, L foot placement, and increased stance time.  Pt tends to hold LLE in hip ER.  Pt's significant visual deficits limit her confidence with ambulation.  Pt reported that LLE felt numb, and tires easily.  Simulated car transfer to sedan height seat with min assist, cues for L attention  L LE neuromuscular re-education via forced use, manual cues for stance control during R foot tsps onto 8" high step, with mod assist for balance.  Up/down 4 steps with 2 rails,  non- alternating  Leading with R foot ascending and descending until manually cued lead with L, with min assist overall.  L attention to task with mod cues.  Pt unable to read room number or name when standing 1' away, even with various head movements to try to locate info.  Pt cooperative and conversational today.  She reported R rib tenderness with manual cues. Info conveyed to RN.  PM- Pt in bed but willing to go to therapy.  Pt appeared to remember therapist from AM session.  Gait in home environment with HHA on R.  Pt stood at sink for combing hair, with supervision.  Gait on carpet with HHA, negotiating congested area with VCs and auditory cues to locate items on her L.  Standing endurance and neuromuscular re-education via forced use LLE x 10 minutes at table, without trunk or UE support, while using L hand in choosing and arranging silk flowers.   Pt's vision for this task at midline was functional 80% of the time.  She was not able to identify the bright yellow color of the vase she held..     Therapy Documentation Precautions:    Precautions Precautions: Fall Precaution Comments: Impulsive and unsafe,  significant visual field impairments difficult to assess formally-has limitations in all visual fields functionally and L inattention vs. l visual field cut, CHF, COPD, CAD, h/o MI, cocaine abuse, Schizophrenia, dementia   Pain: Pain Assessment Pain Assessment: No/denies pain   Locomotion : Ambulation Ambulation/Gait Assistance: 4: Min assist      See FIM for current functional status  Therapy/Group: Individual Therapy  [name] 10/14/2011, 12:15 PM

## 2011-10-14 NOTE — Progress Notes (Signed)
Speech Language Pathology Daily Session Note  Patient Details  Name: [name] MRN: [id number] Date of Birth: 1945-10-13  Today's Date: 10/14/2011 Time: 0940-1050 Time Calculation (min): 70 min  Short Term Goals: Week 1: SLP Short Term Goal 1 (Week 1): Patient will demonstrate orientation to new situation with mod assist semantic cues  SLP Short Term Goal 2 (Week 1): Patient will request help as needed during basic, functional tasks with mod assist semantic cues  SLP Short Term Goal 3 (Week 1): Patient will demonstrate basic problem solving with familiar tasks with mod assist multi-modal cues SLP Short Term Goal 4 (Week 1): Patient will increase speech intelligibility at the sentence level of verbal expression with mod assist verbal   Skilled Therapeutic Interventions:  Treatment session focused on addressing cognition and speech intelligibility.  SLP facilitated session with max faded to mod assist multi-modal cues to problem solve during self feeding task.  Patient's decreased awareness of new visual/perceptual deficits impacts her ability to use compensatory strategies.  However, she was able to direct caregiver with min assist verbal/question cues to clarify due to vague directions and quick rate of speech, which impacted speech intelligibility at the sentence level of verbal expression.     FIM:  Comprehension Comprehension Mode: Auditory Comprehension: 4-Understands basic 75 - 89% of the time/requires cueing 10 - 24% of the time Expression Expression Mode: Verbal Expression: 4-Expresses basic 75 - 89% of the time/requires cueing 10 - 24% of the time. Needs helper to occlude trach/needs to repeat words. Social Interaction Social Interaction: 3-Interacts appropriately 50 - 74% of the time - May be physically or verbally inappropriate. Problem Solving Problem Solving: 3-Solves basic 50 - 74% of the time/requires cueing 25 - 49% of the time Memory Memory: 3-Recognizes or recalls  50 - 74% of the time/requires cueing 25 - 49% of the time FIM - Eating Eating Activity: 4: Help with managing cup/glass  Pain Pain Assessment Pain Assessment: No/denies pain  Therapy/Group: Individual Therapy  Charlane Ferretti., CCC-SLP [phone]  [name] 10/14/2011, 11:10 AM

## 2011-10-14 NOTE — Progress Notes (Signed)
Social Work Patient ID: [name], female   DOB: 11-Oct-1945, 66 y.o.   MRN: [id number] Spoke with Rhonda-pt's niece to inform team conference goals and discharge date 8/31. Also requests by pt for chewing gum, her brush and her underwear. Pt reports feeling better today, but how this place tires her out, she needs to rest.  She continues to work in therapies and participates.  Continue to work On discharge plans.  Rhonda aware of team's recommendation of 24 hour supervision level.

## 2011-10-14 NOTE — Progress Notes (Signed)
Patient ID: [name], female   DOB: [birth year], 66 y.o.   MRN: [id number]  Subjective/Complaints: No complaints. Working with [employer] this am  A 12 point review of systems has been performed and if not noted above is otherwise negative.   Objective: Vital Signs: Blood pressure 134/86, pulse 50, temperature 98.9 F (37.2 C), temperature source Oral, resp. rate 18, height 5\' 1"  (1.549 m), weight 41.7 kg (91 lb 14.9 oz), SpO2 96.00%. No results found. Results for orders placed during the hospital encounter of 10/11/11 (from the past 72 hour(s))  CBC WITH DIFFERENTIAL     Status: Normal   Collection Time   10/12/11  9:13 AM      Component Value Range Comment   WBC 4.3  4.0 - 10.5 K/uL    RBC 4.96  3.87 - 5.11 MIL/uL    Hemoglobin 15.0  12.0 - 15.0 g/dL    HCT 40.9  81.1 - 91.4 %    MCV 91.9  78.0 - 100.0 fL    MCH 30.2  26.0 - 34.0 pg    MCHC 32.9  30.0 - 36.0 g/dL    RDW 78.2  95.6 - 21.3 %    Platelets 205  150 - 400 K/uL    Neutrophils Relative 60  43 - 77 %    Neutro Abs 2.6  1.7 - 7.7 K/uL    Lymphocytes Relative 26  12 - 46 %    Lymphs Abs 1.1  0.7 - 4.0 K/uL    Monocytes Relative 12  3 - 12 %    Monocytes Absolute 0.5  0.1 - 1.0 K/uL    Eosinophils Relative 2  0 - 5 %    Eosinophils Absolute 0.1  0.0 - 0.7 K/uL    Basophils Relative 1  0 - 1 %    Basophils Absolute 0.0  0.0 - 0.1 K/uL   COMPREHENSIVE METABOLIC PANEL     Status: Abnormal   Collection Time   10/12/11  9:13 AM      Component Value Range Comment   Sodium 140  135 - 145 mEq/L    Potassium 4.3  3.5 - 5.1 mEq/L    Chloride 102  96 - 112 mEq/L    CO2 26  19 - 32 mEq/L    Glucose, Bld 101 (*) 70 - 99 mg/dL    BUN 35 (*) 6 - 23 mg/dL    Creatinine, Ser 0.86 (*) 0.50 - 1.10 mg/dL    Calcium 57.8  8.4 - 10.5 mg/dL    Total Protein 7.9  6.0 - 8.3 g/dL    Albumin 3.8  3.5 - 5.2 g/dL    AST 43 (*) 0 - 37 U/L    ALT 20  0 - 35 U/L    Alkaline Phosphatase 90  39 - 117 U/L    Total Bilirubin 0.2 (*) 0.3 -  1.2 mg/dL    GFR calc non Af Amer 38 (*) >90 mL/min    GFR calc Af Amer 44 (*) >90 mL/min   BASIC METABOLIC PANEL     Status: Abnormal   Collection Time   10/14/11  7:25 AM      Component Value Range Comment   Sodium 139  135 - 145 mEq/L    Potassium 5.6 (*) 3.5 - 5.1 mEq/L HEMOLYSIS AT THIS LEVEL MAY AFFECT RESULT   Chloride 99  96 - 112 mEq/L    CO2 30  19 - 32 mEq/L  Glucose, Bld 81  70 - 99 mg/dL    BUN 37 (*) 6 - 23 mg/dL    Creatinine, Ser 1.61 (*) 0.50 - 1.10 mg/dL    Calcium 09.6  8.4 - 10.5 mg/dL    GFR calc non Af Amer 41 (*) >90 mL/min    GFR calc Af Amer 48 (*) >90 mL/min      HEENT: normal Cardio: RRR Resp: CTA B/L GI: BS positive Extremity:  No Edema Skin:   Intact Neuro: Agitated, Abnormal Sensory reduced on Left side, Abnormal Motor 4/5 L Delt Bi Tri Grip, HF,KE,ADF, Abnormal FMC Ataxic/ dec FMC and Apraxic Musc/Skel:  Normal   Assessment/Plan: 1. Functional deficits secondary to R Parieto-occipital thrombotic infarct which require 3+ hours per day of interdisciplinary therapy in a comprehensive inpatient rehab setting. Physiatrist is providing close team supervision and 24 hour management of active medical problems listed below. Physiatrist and rehab team continue to assess barriers to discharge/monitor patient progress toward functional and medical goals. FIM: FIM - Bathing Bathing Steps Patient Completed: Chest;Right Arm;Left Arm;Abdomen;Front perineal area;Buttocks;Right upper leg;Left upper leg;Right lower leg (including foot) Bathing: 4: Min-Patient completes 8-9 22f 10 parts or 75+ percent  FIM - Upper Body Dressing/Undressing Upper body dressing/undressing steps patient completed: Thread/unthread right bra strap;Thread/unthread left bra strap;Thread/unthread right sleeve of pullover shirt/dresss;Thread/unthread left sleeve of pullover shirt/dress;Put head through opening of pull over shirt/dress;Thread/unthread right sleeve of front closure  shirt/dress;Thread/unthread left sleeve of front closure shirt/dress Upper body dressing/undressing: 3: Mod-Patient completed 50-74% of tasks (long fingernails make buttons difficult) FIM - Lower Body Dressing/Undressing Lower body dressing/undressing steps patient completed: Thread/unthread right pants leg;Thread/unthread left pants leg;Pull pants up/down;Don/Doff right sock;Don/Doff left sock Lower body dressing/undressing: 3: Mod-Patient completed 50-74% of tasks        FIM - Banker Devices: Arm rests Bed/Chair Transfer: 0: Activity did not occur  FIM - Locomotion: Wheelchair Distance: 100 with cues for hand placement, UE propulsion sequence, and for attention to obstacles on L Locomotion: Wheelchair: 0: Activity did not occur FIM - Locomotion: Ambulation Locomotion: Ambulation Assistive Devices: Other (comment) (L HHA) Ambulation/Gait Assistance: 2: Max assist Locomotion: Ambulation: 0: Activity did not occur  Comprehension Comprehension Mode: Auditory Comprehension: 4-Understands basic 75 - 89% of the time/requires cueing 10 - 24% of the time  Expression Expression Mode: Verbal Expression: 4-Expresses basic 75 - 89% of the time/requires cueing 10 - 24% of the time. Needs helper to occlude trach/needs to repeat words.  Social Interaction Social Interaction: 4-Interacts appropriately 75 - 89% of the time - Needs redirection for appropriate language or to initiate interaction.  Problem Solving Problem Solving: 4-Solves basic 75 - 89% of the time/requires cueing 10 - 24% of the time  Memory Memory: 2-Recognizes or recalls 25 - 49% of the time/requires cueing 51 - 75% of the time Medical Problem List and Plan:  1. Right MCA thrombotic infarction . Continue aspirin therapy 2. DVT Prophylaxis/Anticoagulation: Subcutaneous heparin. Monitor platelet counts any signs of bleeding  3. Mood: History of anxiety as well as schizophrenia.  Continue Zyprexa 2.5 mg daily as well as Seroquel 50 mg at bedtime. Noted some bouts of agitation but improving. Needs some encouragement to participate with therapies. Provide emotional support and positive reinforcement  4. Neuropsych: This patient is capable of making decisions on his/her own behalf.  5. Hypertension. Coreg 3.125 mg twice a day, Lasix 40 mg daily. Monitor with increased mobility . 6. Asthma. Symbicort twice a day, Spiriva daily,  Xopenex every 6 hours as needed. Patient currently denies any shortness of breath. Check oxygen saturations every shift. 7. 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 Continue aspirin daily. Patient denies any chest pain- check weights, follow i's and o's.  8. CHF. Continue Lasix 40 mg daily. Monitor for any signs of fluid overload . No chest pain or shortness of breath noted. Admission labs were unremarkable  -recheck labs in the am. BUN and CR elevated but stable. Hold lasix  -discontinue potassium supplement (appears sample may have been hemolyzed) 9. Hyperlipidemia. Zocor  10. GERD. Pepcid  10. History of polysubstance abuse. Latest urine drug screen negative    LOS (Days) 3 A FACE TO FACE EVALUATION WAS PERFORMED  Orella Cushman T 10/14/2011, 9:59 AM

## 2011-10-14 NOTE — Progress Notes (Signed)
Patient's LBM 8/20, sorbitol given.

## 2011-10-14 NOTE — Progress Notes (Signed)
Occupational Therapy Session Note  Patient Details  Name: [name] MRN: [id number] Date of Birth: [birth year]  Today's Date: 10/14/2011 Time: 0940-1050 Time Calculation (min): 70 min  Short Term Goals: Week 1: OT Short Term Goal 1 (Week 1): Shower: Min assist sit and stand OT Short Term Goal 2 (Week 1): LB Dressing: Min assist to include sit and stand OT Short Term Goal 3 (Week 1): Toilet Transfer: Min assist OT Short Term Goal 4 (Week 1): Continue to assess and treat Visual Deficits  Skilled Therapeutic Interventions:  Patient sleeping in bed upon arrival.  Agreeable to get up to shower, change clothes and perform groom tasks.  Focus session on activity tolerance, dynamic balance sitting, standing and ambulating, visual scanning, patient beginning to adapt to visual deficits as she is often observed exploring items with her fingers to be certain they are oriented.  Patient demonstrates deficits with spatial orientation during BADL.  While sit><stand, standing, and ambulating patient favors stronger RLE.  Patient does not like to put weight onto her LLE and states, "my leg won't do right" & "my leg is stiff".  Patient is HHA to ambulate or she often chooses to furniture walk. Patient able to donn wig on first try when wig strap is hooked for her and placed in front of her in the correct orientation.  This clinician called patient's sister yesterday to ask family to bring patient her brush, lipstick and underware.    Therapy Documentation Precautions:  Precautions Precautions: Fall Precaution Comments: Impulsive and unsafe,  significant visual field impairments difficult to assess formally-has limitations in all visual fields functionally and L inattention vs. l visual field cut, CHF, COPD, CAD, h/o MI, cocaine abuse, Schizophrenia, dementia  Pain: Occasionally reports left foot pain, unable to rate her pain, premedicated  See FIM for current functional status  Therapy/Group:  Individual Therapy  [name] 10/14/2011, 10:51 AM

## 2011-10-15 ENCOUNTER — Inpatient Hospital Stay (HOSPITAL_COMMUNITY): Payer: Medicare Other

## 2011-10-15 ENCOUNTER — Inpatient Hospital Stay (HOSPITAL_COMMUNITY): Payer: Medicare Other | Admitting: Speech Pathology

## 2011-10-15 LAB — BASIC METABOLIC PANEL
BUN: 32 mg/dL — ABNORMAL HIGH (ref 6–23)
CO2: 25 mEq/L (ref 19–32)
Calcium: 9.9 mg/dL (ref 8.4–10.5)
Creatinine, Ser: 1.35 mg/dL — ABNORMAL HIGH (ref 0.50–1.10)

## 2011-10-15 NOTE — Progress Notes (Signed)
Occupational Therapy Session Note  Patient Details  Name: [name] MRN: [id number] Date of Birth: 01/23/1946  Today's Date: 10/15/2011 Time: 1330-1400 Time Calculation (min): 30 min  Short Term Goals: Week 1:  OT Short Term Goal 1 (Week 1): Shower: Min assist sit and stand OT Short Term Goal 2 (Week 1): LB Dressing: Min assist to include sit and stand OT Short Term Goal 3 (Week 1): Toilet Transfer: Min assist OT Short Term Goal 4 (Week 1): Continue to assess and treat Visual Deficits  Skilled Therapeutic Interventions: ADL-retrainig with emphasis on visual attention, functional mobility, transfers, and safety.   Patient was initially asleep during contact but awoke to cues to eat lunch and to receive RN treatments (meds).   Patient completed functional mobility from bed to bathroom for toileting and transferred to toilet using only grab bar for safety.   Patient completed toilet hygiene but required assist for supplies (toilet paper).  Ambulation required min assist due to L-LE weakness.  Patient returned to bed and sat at EOB to consume her lunch meal with min assist to open packets and to locate items on bedside table.  Visual attention improved with use of bifocals.  Therapy Documentation Precautions:  Precautions Precautions: Fall Precaution Comments: Impulsive and unsafe,  significant visual field impairments difficult to assess formally-has limitations in all visual fields functionally and L inattention vs. l visual field cut, CHF, COPD, CAD, h/o MI, cocaine abuse, Schizophrenia, dementia  Pain: No report of pain  See FIM for current functional status  Therapy: Individual Therapy  Georgeanne Nim 10/15/2011, 3:48 PM

## 2011-10-15 NOTE — Progress Notes (Signed)
Patient ID: [name], female   DOB: April 01, 1945, 67 y.o.   MRN: [id number]  Subjective/Complaints: No complaints. Working with [employer] this am  A 12 point review of systems has been performed and if not noted above is otherwise negative.   Objective: Vital Signs: Blood pressure 95/67, pulse 52, temperature 97.6 F (36.4 C), temperature source Oral, resp. rate 17, height 5\' 1"  (1.549 m), weight 40.3 kg (88 lb 13.5 oz), SpO2 94.00%. No results found. Results for orders placed during the hospital encounter of 10/11/11 (from the past 72 hour(s))  CBC WITH DIFFERENTIAL     Status: Normal   Collection Time   10/12/11  9:13 AM      Component Value Range Comment   WBC 4.3  4.0 - 10.5 K/uL    RBC 4.96  3.87 - 5.11 MIL/uL    Hemoglobin 15.0  12.0 - 15.0 g/dL    HCT 30.8  65.7 - 84.6 %    MCV 91.9  78.0 - 100.0 fL    MCH 30.2  26.0 - 34.0 pg    MCHC 32.9  30.0 - 36.0 g/dL    RDW 96.2  95.2 - 84.1 %    Platelets 205  150 - 400 K/uL    Neutrophils Relative 60  43 - 77 %    Neutro Abs 2.6  1.7 - 7.7 K/uL    Lymphocytes Relative 26  12 - 46 %    Lymphs Abs 1.1  0.7 - 4.0 K/uL    Monocytes Relative 12  3 - 12 %    Monocytes Absolute 0.5  0.1 - 1.0 K/uL    Eosinophils Relative 2  0 - 5 %    Eosinophils Absolute 0.1  0.0 - 0.7 K/uL    Basophils Relative 1  0 - 1 %    Basophils Absolute 0.0  0.0 - 0.1 K/uL   COMPREHENSIVE METABOLIC PANEL     Status: Abnormal   Collection Time   10/12/11  9:13 AM      Component Value Range Comment   Sodium 140  135 - 145 mEq/L    Potassium 4.3  3.5 - 5.1 mEq/L    Chloride 102  96 - 112 mEq/L    CO2 26  19 - 32 mEq/L    Glucose, Bld 101 (*) 70 - 99 mg/dL    BUN 35 (*) 6 - 23 mg/dL    Creatinine, Ser 3.24 (*) 0.50 - 1.10 mg/dL    Calcium 40.1  8.4 - 10.5 mg/dL    Total Protein 7.9  6.0 - 8.3 g/dL    Albumin 3.8  3.5 - 5.2 g/dL    AST 43 (*) 0 - 37 U/L    ALT 20  0 - 35 U/L    Alkaline Phosphatase 90  39 - 117 U/L    Total Bilirubin 0.2 (*) 0.3 -  1.2 mg/dL    GFR calc non Af Amer 38 (*) >90 mL/min    GFR calc Af Amer 44 (*) >90 mL/min   BASIC METABOLIC PANEL     Status: Abnormal   Collection Time   10/14/11  7:25 AM      Component Value Range Comment   Sodium 139  135 - 145 mEq/L    Potassium 5.6 (*) 3.5 - 5.1 mEq/L HEMOLYSIS AT THIS LEVEL MAY AFFECT RESULT   Chloride 99  96 - 112 mEq/L    CO2 30  19 - 32 mEq/L  Glucose, Bld 81  70 - 99 mg/dL    BUN 37 (*) 6 - 23 mg/dL    Creatinine, Ser 1.61 (*) 0.50 - 1.10 mg/dL    Calcium 09.6  8.4 - 10.5 mg/dL    GFR calc non Af Amer 41 (*) >90 mL/min    GFR calc Af Amer 48 (*) >90 mL/min   BASIC METABOLIC PANEL     Status: Abnormal   Collection Time   10/14/11 12:56 PM      Component Value Range Comment   Sodium 138  135 - 145 mEq/L    Potassium 4.1  3.5 - 5.1 mEq/L    Chloride 98  96 - 112 mEq/L    CO2 29  19 - 32 mEq/L    Glucose, Bld 100 (*) 70 - 99 mg/dL    BUN 39 (*) 6 - 23 mg/dL    Creatinine, Ser 0.45 (*) 0.50 - 1.10 mg/dL    Calcium 9.8  8.4 - 40.9 mg/dL    GFR calc non Af Amer 35 (*) >90 mL/min    GFR calc Af Amer 40 (*) >90 mL/min      HEENT: normal Cardio: RRR Resp: CTA B/L GI: BS positive Extremity:  No Edema Skin:   Intact Neuro: Agitated, Abnormal Sensory reduced on Left side, Abnormal Motor 4/5 L Delt Bi Tri Grip, HF,KE,ADF, Abnormal FMC Ataxic/ dec FMC and Apraxic Musc/Skel:  Normal   Assessment/Plan: 1. Functional deficits secondary to R Parieto-occipital thrombotic infarct which require 3+ hours per day of interdisciplinary therapy in a comprehensive inpatient rehab setting. Physiatrist is providing close team supervision and 24 hour management of active medical problems listed below. Physiatrist and rehab team continue to assess barriers to discharge/monitor patient progress toward functional and medical goals. FIM: FIM - Bathing Bathing Steps Patient Completed: Chest;Right Arm;Left Arm;Abdomen;Front perineal area;Buttocks;Right upper leg;Left upper  leg;Right lower leg (including foot);Left lower leg (including foot) Bathing: 4: Steadying assist  FIM - Upper Body Dressing/Undressing Upper body dressing/undressing steps patient completed: Thread/unthread right bra strap;Thread/unthread left bra strap;Thread/unthread right sleeve of pullover shirt/dresss;Thread/unthread left sleeve of pullover shirt/dress;Put head through opening of pull over shirt/dress Upper body dressing/undressing: 4: Min-Patient completed 75 plus % of tasks FIM - Lower Body Dressing/Undressing Lower body dressing/undressing steps patient completed: Thread/unthread right pants leg;Thread/unthread left pants leg;Pull pants up/down;Don/Doff right sock;Don/Doff left sock;Thread/unthread right underwear leg;Thread/unthread left underwear leg;Pull underwear up/down;Don/Doff right shoe Lower body dressing/undressing: 4: Min-Patient completed 75 plus % of tasks        FIM - Press photographer Assistive Devices: Arm rests Bed/Chair Transfer: 4: Bed > Chair or W/C: Min A (steadying Pt. > 75%)  FIM - Locomotion: Wheelchair Distance: 100 with cues for hand placement, UE propulsion sequence, and for attention to obstacles on L Locomotion: Wheelchair: 0: Activity did not occur FIM - Locomotion: Ambulation Locomotion: Ambulation Assistive Devices: Other (comment) (R HHA) Ambulation/Gait Assistance: 4: Min assist Locomotion: Ambulation: 4: Travels 150 ft or more with minimal assistance (Pt.>75%)  Comprehension Comprehension Mode: Auditory Comprehension: 4-Understands basic 75 - 89% of the time/requires cueing 10 - 24% of the time  Expression Expression Mode: Verbal Expression: 4-Expresses basic 75 - 89% of the time/requires cueing 10 - 24% of the time. Needs helper to occlude trach/needs to repeat words.  Social Interaction Social Interaction: 3-Interacts appropriately 50 - 74% of the time - May be physically or verbally inappropriate.  Problem  Solving Problem Solving: 3-Solves basic 50 - 74% of the time/requires  cueing 25 - 49% of the time  Memory Memory: 3-Recognizes or recalls 50 - 74% of the time/requires cueing 25 - 49% of the time Medical Problem List and Plan:  1. Right MCA thrombotic infarction . Continue aspirin therapy 2. DVT Prophylaxis/Anticoagulation: Subcutaneous heparin. Monitor platelet counts any signs of bleeding  3. Mood: History of anxiety as well as schizophrenia. Continue Zyprexa 2.5 mg daily as well as Seroquel 50 mg at bedtime. Noted some bouts of agitation but improving. Needs some encouragement to participate with therapies. Provide emotional support and positive reinforcement  4. Neuropsych: This patient is capable of making decisions on his/her own behalf.  5. Hypertension. Coreg 3.125 mg twice a day, Lasix 40 mg daily. Monitor with increased mobility . 6. Asthma. Symbicort twice a day, Spiriva daily, Xopenex every 6 hours as needed. Patient currently denies any shortness of breath. Check oxygen saturations every shift. 7. 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 Continue aspirin daily. Patient denies any chest pain- check weights, follow i's and o's.  8. CHF. Continue Lasix 40 mg daily. Monitor for any signs of fluid overload . No chest pain or shortness of breath noted. Admission labs were unremarkable  -recheck labs today. Push fluids. Lasix stopped- may need gentle ivf  -discontinue potassium supplement (appears sample may have been hemolyzed) 9. Hyperlipidemia. Zocor  10. GERD. Pepcid  10. History of polysubstance abuse. Latest urine drug screen negative    LOS (Days) 4 A FACE TO FACE EVALUATION WAS PERFORMED  SWARTZ,ZACHARY T 10/15/2011, 7:40 AM

## 2011-10-15 NOTE — Progress Notes (Signed)
Speech Language Pathology Daily Session Note  Patient Details  Name: [name] MRN: [id number] Date of Birth: 07/24/1945  Today's Date: 10/15/2011 Time: 0800-0830 Time Calculation (min): 30 min  Short Term Goals: Week 1: SLP Short Term Goal 1 (Week 1): Patient will demonstrate orientation to new situation with mod assist semantic cues  SLP Short Term Goal 1 - Progress (Week 1): Progressing toward goal SLP Short Term Goal 2 (Week 1): Patient will request help as needed during basic, functional tasks with mod assist semantic cues  SLP Short Term Goal 2 - Progress (Week 1): Progressing toward goal SLP Short Term Goal 3 (Week 1): Patient will demonstrate basic problem solving with familiar tasks with mod assist multi-modal cues SLP Short Term Goal 3 - Progress (Week 1): Progressing toward goal SLP Short Term Goal 4 (Week 1): Patient will increase speech intelligibility at the sentence level of verbal expression with mod assist verbal  SLP Short Term Goal 4 - Progress (Week 1): Progressing toward goal  Skilled Therapeutic Interventions: Treatment focused on cognition and speech intelligibility.  SLP provided mod-max verbal cueing for basic problem solving during functional self feeding task, max verbal cueing to request help for difficulty activities, and max verbal cues to slow rate for increased speech intelligibility. Patient with poor awareness of visual/perceptual deficits s/p new CVA and also with ? left sided neglect for which she requires mod verbal cues to attend do.     FIM:  Comprehension Comprehension Mode: Auditory Comprehension: 4-Understands basic 75 - 89% of the time/requires cueing 10 - 24% of the time Expression Expression Mode: Verbal Expression: 4-Expresses basic 75 - 89% of the time/requires cueing 10 - 24% of the time. Needs helper to occlude trach/needs to repeat words. Social Interaction Social Interaction: 3-Interacts appropriately 50 - 74% of the time - May be  physically or verbally inappropriate. Problem Solving Problem Solving: 3-Solves basic 50 - 74% of the time/requires cueing 25 - 49% of the time Memory Memory: 3-Recognizes or recalls 50 - 74% of the time/requires cueing 25 - 49% of the time  Pain Pain Assessment Pain Assessment: No/denies pain  Therapy/Group: Individual Therapy  Jonael Paradiso Meryl 10/15/2011, 8:34 AM

## 2011-10-16 ENCOUNTER — Inpatient Hospital Stay (HOSPITAL_COMMUNITY): Payer: Medicare Other | Admitting: *Deleted

## 2011-10-16 ENCOUNTER — Inpatient Hospital Stay (HOSPITAL_COMMUNITY): Payer: Medicare Other

## 2011-10-16 NOTE — Progress Notes (Addendum)
Physical Therapy Session Note  Patient Details  Name: [name] MRN: [id number] Date of Birth: [birth year]  Today's Date: 10/16/2011  1:2 Time: 4098-1191 Time Calculation (min): 57 min   Skilled Therapeutic Interventions/Progress Updates:  Tx focused on gait training and working towards finding successful ways of increasing L weight-shifting and weight-bearing. Pt had several outbursts of frustration, but seemed to understand why we were working on things that are so hard for her.   Sit<>stand throughout tx with Min A for steadying on R side. Pt became anxious when therapist on L side.   Gait training 2x150' with R HHA and Min A for steadying only. Pt able to adjust gait from step-to pattern to more + step lengths, but unable to increase stance time on L significantly. Focused on auditory cues for stance time improvements with some success.   L stance tasks: Attempted step-taps to 8" step with RLE, but pt unable today, very anxious to bring weight to LLE and not wanting therapist to assist with weight shift. Also tried 4" step height with some success. Pt able to complete 8 taps with Mod A, then abruptly sitting, nit wanting to continue. Attempted sit<>stands with R foot elevated on 4" step. Pt initially unable, placing R foot on ground or picking L foot up to step, but eventually, able to perform 5 sit<>stands with mod A for steadying. Attempted sit<>partial squat with bil UEs on L knee, but pt unable to shift the weight to L. Static standing weight-shifts R/L with Mod A to shift hips, but pt resisting L shift, falling back to mat.   Nustepx8 min with LLE and LUE only x30min, then with LLE only and therapist advancing UE back to start position again in order to increase motor activity with LLE only.    2:2 Time: 13:00-13:50 Time Calculation (min): 50 min Pt with improved mood this afternoon, enjoying talking about her sewing hobby.  Tx focused on dynamic gait during functional tasks  as well as continuing work on L stance and weight-bearing.  Gait in controlled carpeted environment with HHA for steadying on R 4x50' picking up objects from various heights, including floor and carrying them to place elsewhere. Pt needing verbal, visual, and tactile cues for some objects, but not others.  Side-stepping R/L 2x20' each direction with up to Mod A and inability to maintain straight path, especially towards R.   Step taps with RLE to bright cone 2x15 with up to Mod A for steadying. Step-over cane and back with RLE only, focusing on stance with LLE. Pt c/o UEs getting tired with these tasks, likely due to over-compensating for lack of weight-bearing on LLE.      Therapy Documentation Precautions:  Precautions Precautions: Fall Precaution Comments: Impulsive and unsafe,  significant visual field impairments difficult to assess formally-has limitations in all visual fields functionally and L inattention vs. l visual field cut, CHF, COPD, CAD, h/o MI, cocaine abuse, Schizophrenia, dementia Restrictions Weight Bearing Restrictions: No  Pain: No complaints        See FIM for current functional status  Therapy/Group: Individual Therapy  Virl Cagey, PT 10/16/2011, 12:25 PM

## 2011-10-16 NOTE — Progress Notes (Signed)
Occupational Therapy Session Note  Patient Details  Name: [name] MRN: [id number] Date of Birth: 1945-02-26  Today's Date: 10/16/2011 Time: 0800-0855 Time Calculation (min): 55 min  Short Term Goals: Week 1:  OT Short Term Goal 1 (Week 1): Shower: Min assist sit and stand OT Short Term Goal 2 (Week 1): LB Dressing: Min assist to include sit and stand OT Short Term Goal 3 (Week 1): Toilet Transfer: Min assist OT Short Term Goal 4 (Week 1): Continue to assess and treat Visual Deficits  Skilled Therapeutic Interventions:  ADL-retraining at walk-in shower with emphasis on dynamic sitting and standing balance, LE coordination, sit-stand at shower, visual attention, and assisted lower body dressing.   Patient ambulates within room without device but with OT providing steadying assist and supervision for safety.  During seated shower patient reported some discomfort at both feet, resembling hypersensitivity to touch (irritated by texture of wet towel at her feet).  Motor control of LE was grossly impaired and without coordination during seated pivots and repositioning but improved once shower ceased and towels were removed.   Patient requires assist with drying feet and obtaining clothing but progressed through lower body dressing, performing sit-stand as needed, with only steadying assist.     Therapy Documentation Precautions:  Precautions Precautions: Fall Precaution Comments: Impulsive and unsafe,  significant visual field impairments difficult to assess formally-has limitations in all visual fields functionally and L inattention vs. l visual field cut, CHF, COPD, CAD, h/o MI, cocaine abuse, Schizophrenia, dementia Restrictions Weight Bearing Restrictions: No  Vital Signs: Therapy Vitals Temp: 97.9 F (36.6 C) Temp src: Oral Pulse Rate: 47  Resp: 17  BP: 115/82 mmHg Patient Position, if appropriate: Lying Oxygen Therapy SpO2: 93 % O2 Device: None (Room air)  Pain:  Patient  complained of residual discomfort from injections in lower abdomin which resolved through distractions and increased activity.  See FIM for current functional status  Therapy/Group: Individual Therapy  Second session: Time: 1308-6578 Time Calculation (min):  28 min  Pain Assessment: No report of pain  Skilled Therapeutic Interventions: Therapeutic activities with emphasis on functional transfers, visual attention, communication, and supportive emotional regulation.   Patient was tearful during initial encounter stating she was tired and needed to change her pants that were too tight after lunch.   Patient was assisted with lower body undressing and dressing in loose clothing and then escorted to sink side and assisted with oral care where she required setup and visual attention cues to find items.   Once clothing and oral care were completed patient recovered from lability and was assisted back to bed for rest.   See FIM for current functional status  Therapy/Group: Individual Therapy   Georgeanne Nim 10/16/2011, 9:02 AM

## 2011-10-16 NOTE — Progress Notes (Signed)
Patient ID: [name], female   DOB: 11-19-1945, 66 y.o.   MRN: [id number]  Subjective/Complaints: No complaints. Rested well.  A 12 point review of systems has been performed and if not noted above is otherwise negative.   Objective: Vital Signs: Blood pressure 115/82, pulse 47, temperature 97.9 F (36.6 C), temperature source Oral, resp. rate 17, height 5\' 1"  (1.549 m), weight 40.3 kg (88 lb 13.5 oz), SpO2 93.00%. No results found. Results for orders placed during the hospital encounter of 10/11/11 (from the past 72 hour(s))  BASIC METABOLIC PANEL     Status: Abnormal   Collection Time   10/14/11  7:25 AM      Component Value Range Comment   Sodium 139  135 - 145 mEq/L    Potassium 5.6 (*) 3.5 - 5.1 mEq/L HEMOLYSIS AT THIS LEVEL MAY AFFECT RESULT   Chloride 99  96 - 112 mEq/L    CO2 30  19 - 32 mEq/L    Glucose, Bld 81  70 - 99 mg/dL    BUN 37 (*) 6 - 23 mg/dL    Creatinine, Ser 5.62 (*) 0.50 - 1.10 mg/dL    Calcium 13.0  8.4 - 10.5 mg/dL    GFR calc non Af Amer 41 (*) >90 mL/min    GFR calc Af Amer 48 (*) >90 mL/min   BASIC METABOLIC PANEL     Status: Abnormal   Collection Time   10/14/11 12:56 PM      Component Value Range Comment   Sodium 138  135 - 145 mEq/L    Potassium 4.1  3.5 - 5.1 mEq/L    Chloride 98  96 - 112 mEq/L    CO2 29  19 - 32 mEq/L    Glucose, Bld 100 (*) 70 - 99 mg/dL    BUN 39 (*) 6 - 23 mg/dL    Creatinine, Ser 8.65 (*) 0.50 - 1.10 mg/dL    Calcium 9.8  8.4 - 78.4 mg/dL    GFR calc non Af Amer 35 (*) >90 mL/min    GFR calc Af Amer 40 (*) >90 mL/min   BASIC METABOLIC PANEL     Status: Abnormal   Collection Time   10/15/11  7:00 AM      Component Value Range Comment   Sodium 138  135 - 145 mEq/L    Potassium 4.4  3.5 - 5.1 mEq/L    Chloride 100  96 - 112 mEq/L    CO2 25  19 - 32 mEq/L    Glucose, Bld 79  70 - 99 mg/dL    BUN 32 (*) 6 - 23 mg/dL    Creatinine, Ser 6.96 (*) 0.50 - 1.10 mg/dL    Calcium 9.9  8.4 - 29.5 mg/dL    GFR calc non  Af Amer 40 (*) >90 mL/min    GFR calc Af Amer 46 (*) >90 mL/min      HEENT: normal Cardio: RRR Resp: CTA B/L GI: BS positive Extremity:  No Edema Skin:   Intact Neuro: Agitated, Abnormal Sensory reduced on Left side, Abnormal Motor 4/5 L Delt Bi Tri Grip, HF,KE,ADF, Abnormal FMC Ataxic/ dec FMC and Apraxic Musc/Skel:  Normal Limited insight and awareness  Assessment/Plan: 1. Functional deficits secondary to R Parieto-occipital thrombotic infarct which require 3+ hours per day of interdisciplinary therapy in a comprehensive inpatient rehab setting. Physiatrist is providing close team supervision and 24 hour management of active medical problems listed below. Physiatrist and rehab team  continue to assess barriers to discharge/monitor patient progress toward functional and medical goals. FIM: FIM - Bathing Bathing Steps Patient Completed: Chest;Right Arm;Left Arm;Abdomen;Front perineal area;Buttocks;Right upper leg;Left upper leg;Right lower leg (including foot);Left lower leg (including foot) Bathing: 4: Steadying assist  FIM - Upper Body Dressing/Undressing Upper body dressing/undressing steps patient completed: Thread/unthread right bra strap;Thread/unthread left bra strap;Thread/unthread right sleeve of pullover shirt/dresss;Thread/unthread left sleeve of pullover shirt/dress;Put head through opening of pull over shirt/dress Upper body dressing/undressing: 4: Min-Patient completed 75 plus % of tasks FIM - Lower Body Dressing/Undressing Lower body dressing/undressing steps patient completed: Thread/unthread right pants leg;Thread/unthread left pants leg;Pull pants up/down;Don/Doff right sock;Don/Doff left sock;Thread/unthread right underwear leg;Thread/unthread left underwear leg;Pull underwear up/down;Don/Doff right shoe Lower body dressing/undressing: 4: Min-Patient completed 75 plus % of tasks  FIM - Toileting Toileting steps completed by patient: Adjust clothing prior to  toileting;Performs perineal hygiene;Adjust clothing after toileting Toileting Assistive Devices: Grab bar or rail for support  FIM - Archivist Transfers: 4-To toilet/BSC: Min A (steadying Pt. > 75%);4-From toilet/BSC: Min A (steadying Pt. > 75%)  FIM - Bed/Chair Transfer Bed/Chair Transfer Assistive Devices: Arm rests Bed/Chair Transfer: 4: Bed > Chair or W/C: Min A (steadying Pt. > 75%);4: Supine > Sit: Min A (steadying Pt. > 75%/lift 1 leg);4: Sit > Supine: Min A (steadying pt. > 75%/lift 1 leg)  FIM - Locomotion: Wheelchair Distance: 100 with cues for hand placement, UE propulsion sequence, and for attention to obstacles on L Locomotion: Wheelchair: 1: Travels less than 50 ft with moderate assistance (Pt: 50 - 74%) FIM - Locomotion: Ambulation Locomotion: Ambulation Assistive Devices: Other (comment) (R HHA) Ambulation/Gait Assistance: 4: Min assist Locomotion: Ambulation: 4: Travels 150 ft or more with minimal assistance (Pt.>75%)  Comprehension Comprehension Mode: Auditory Comprehension: 4-Understands basic 75 - 89% of the time/requires cueing 10 - 24% of the time  Expression Expression Mode: Verbal Expression: 4-Expresses basic 75 - 89% of the time/requires cueing 10 - 24% of the time. Needs helper to occlude trach/needs to repeat words.  Social Interaction Social Interaction: 3-Interacts appropriately 50 - 74% of the time - May be physically or verbally inappropriate.  Problem Solving Problem Solving: 3-Solves basic 50 - 74% of the time/requires cueing 25 - 49% of the time  Memory Memory: 3-Recognizes or recalls 50 - 74% of the time/requires cueing 25 - 49% of the time Medical Problem List and Plan:  1. Right MCA thrombotic infarction . Continue aspirin therapy 2. DVT Prophylaxis/Anticoagulation: Subcutaneous heparin. Monitor platelet counts any signs of bleeding  3. Mood: History of anxiety as well as schizophrenia. Continue Zyprexa 2.5 mg daily as well as  Seroquel 50 mg at bedtime. Noted some bouts of agitation but improving. Needs some encouragement to participate with therapies. Provide emotional support and positive reinforcement  4. Neuropsych: This patient is capable of making decisions on his/her own behalf.  5. Hypertension. Coreg 3.125 mg twice a day, Lasix 40 mg daily. Monitor with increased mobility . 6. Asthma. Symbicort twice a day, Spiriva daily, Xopenex every 6 hours as needed. Patient currently denies any shortness of breath. Check oxygen saturations every shift. 7. 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 Continue aspirin daily. Patient denies any chest pain- check weights, follow i's and o's.  8. CHF. Continue Lasix 40 mg daily. Monitor for any signs of fluid overload . No chest pain or shortness of breath noted. Admission labs were unremarkable  -labs better yesterday. Continue to hold lasix. Weights are stable around 40kg  -  discontinued potassium supplement (appears sample may have been hemolyzed) 9. Hyperlipidemia. Zocor  10. GERD. Pepcid  10. History of polysubstance abuse. Latest urine drug screen negative    LOS (Days) 5 A FACE TO FACE EVALUATION WAS PERFORMED  Sharada Albornoz T 10/16/2011, 7:25 AM

## 2011-10-17 ENCOUNTER — Inpatient Hospital Stay (HOSPITAL_COMMUNITY): Payer: Medicare Other | Admitting: Speech Pathology

## 2011-10-17 ENCOUNTER — Inpatient Hospital Stay (HOSPITAL_COMMUNITY): Payer: Medicare Other | Admitting: Occupational Therapy

## 2011-10-17 ENCOUNTER — Inpatient Hospital Stay (HOSPITAL_COMMUNITY): Payer: Medicare Other

## 2011-10-17 ENCOUNTER — Inpatient Hospital Stay (HOSPITAL_COMMUNITY): Payer: Medicare Other | Admitting: *Deleted

## 2011-10-17 DIAGNOSIS — I6330 Cerebral infarction due to thrombosis of unspecified cerebral artery: Secondary | ICD-10-CM

## 2011-10-17 DIAGNOSIS — Z5189 Encounter for other specified aftercare: Secondary | ICD-10-CM

## 2011-10-17 LAB — CBC WITH DIFFERENTIAL/PLATELET
Basophils Absolute: 0.1 10*3/uL (ref 0.0–0.1)
Basophils Relative: 2 % — ABNORMAL HIGH (ref 0–1)
Eosinophils Absolute: 0 10*3/uL (ref 0.0–0.7)
Eosinophils Relative: 1 % (ref 0–5)
HCT: 43.4 % (ref 36.0–46.0)
MCH: 30.7 pg (ref 26.0–34.0)
MCHC: 33.4 g/dL (ref 30.0–36.0)
MCV: 91.8 fL (ref 78.0–100.0)
Monocytes Absolute: 0.4 10*3/uL (ref 0.1–1.0)
Monocytes Relative: 12 % (ref 3–12)
Neutro Abs: 1.4 10*3/uL — ABNORMAL LOW (ref 1.7–7.7)
RDW: 14 % (ref 11.5–15.5)

## 2011-10-17 LAB — RHEUMATOID FACTOR: Rhuematoid fact SerPl-aCnc: <10 IU/mL (ref <=14)

## 2011-10-17 NOTE — Progress Notes (Signed)
Occupational Therapy Session Note  Patient Details  Name: ARMONII SIEH MRN: [id number] Date of Birth: Jan 12, 1946  Today's Date: 10/17/2011 Time: 4098-1191 Time Calculation (min): 30 min  Skilled Therapeutic Interventions/Progress Updates:    Worked on neuromuscular re-education for sit to stand and standing.  Pt with increased leaning to the right side with decreased weight shift to the left in standing. Tends to avoid attempting to use the LLE for any weight bearing or transitions sit to stand.  Also with motor planning difficulties such as when asked to scoot forward she would scoot backwards or to the side.  Needed max demonstrational cueing just to scoot to the EOM for sit to stand.  Attempted to have pt flex trunk and head forward over the LLE for sit to stand to increase weightbearing, but she would resist and lean further to the right side.  Mod assist for transfer from wheelchair to bed with small steps and limited weightbearing over the LLE.   Therapy Documentation Precautions:  Precautions Precautions: Fall Precaution Comments: impulsivity Restrictions Weight Bearing Restrictions: No  Pain: Pain Assessment Pain Assessment: No/denies pain ADL: See FIM for current functional status  Therapy/Group: Individual Therapy  Vasilisa Vore OTR/L 10/17/2011, 4:05 PM

## 2011-10-17 NOTE — Progress Notes (Signed)
Speech Language Pathology Daily Session Note  Patient Details  Name: BRYANNA YIM MRN: [id number] Date of Birth: 20-Apr-1945  Today's Date: 10/17/2011 Time: 4098-1191 Time Calculation (min): 45 min  Short Term Goals: Week 1: SLP Short Term Goal 1 (Week 1): Patient will demonstrate orientation to new situation with mod assist semantic cues  SLP Short Term Goal 1 - Progress (Week 1): Progressing toward goal SLP Short Term Goal 2 (Week 1): Patient will request help as needed during basic, functional tasks with mod assist semantic cues  SLP Short Term Goal 2 - Progress (Week 1): Progressing toward goal SLP Short Term Goal 3 (Week 1): Patient will demonstrate basic problem solving with familiar tasks with mod assist multi-modal cues SLP Short Term Goal 3 - Progress (Week 1): Progressing toward goal SLP Short Term Goal 4 (Week 1): Patient will increase speech intelligibility at the sentence level of verbal expression with mod assist verbal  SLP Short Term Goal 4 - Progress (Week 1): Progressing toward goal  Skilled Therapeutic Interventions: Treatment session focused on addressing cognition and speech intelligibility during a self-feeding task.  SLP facilitated session with mod assist verbal and non-verbal cues to produce intelligible speech.  Patient with sentence-conversational level of verbal expression with a quick pace made speech less intelligible as compare to last week; Patient with no awareness of listener's inability to understand her.  SLP also facilitated session with set up assist of breakfast tray and min assist verbal cues to locate items on tray; Patient requested assistance in 50% of opportunities and at times with increased wait time patient was able to utilize problem solving strategies and locate items on her own.     FIM:  Comprehension Comprehension Mode: Auditory Comprehension: 5-Understands basic 90% of the time/requires cueing < 10% of the time Expression Expression  Mode: Verbal Expression: 3-Expresses basic 50 - 74% of the time/requires cueing 25 - 50% of the time. Needs to repeat parts of sentences. Social Interaction Social Interaction: 4-Interacts appropriately 75 - 89% of the time - Needs redirection for appropriate language or to initiate interaction. Problem Solving Problem Solving: 4-Solves basic 75 - 89% of the time/requires cueing 10 - 24% of the time Memory Memory: 2-Recognizes or recalls 25 - 49% of the time/requires cueing 51 - 75% of the time FIM - Eating Eating Activity: 5: Set-up assist for open containers;5: Set-up assist for cut food;5: Supervision/cues  Pain Pain Assessment Pain Assessment: No/denies pain  Therapy/Group: Individual Therapy  Charlane Ferretti., CCC-SLP [phone]  Drexler Maland 10/17/2011, 11:12 AM

## 2011-10-17 NOTE — Progress Notes (Signed)
Patient ID: JANNEL LYNNE, female   DOB: [birth year], 66 y.o.   MRN: [id number]  Subjective/Complaints: L hip and thigh pain.  No trauma  Objective: Vital Signs: Blood pressure 97/64, pulse 51, temperature 97.5 F (36.4 C), temperature source Oral, resp. rate 20, height 5\' 1"  (1.549 m), weight 40.3 kg (88 lb 13.5 oz), SpO2 100.00%. No results found. Results for orders placed during the hospital encounter of 10/11/11 (from the past 72 hour(s))  BASIC METABOLIC PANEL     Status: Abnormal   Collection Time   10/14/11 12:56 PM      Component Value Range Comment   Sodium 138  135 - 145 mEq/L    Potassium 4.1  3.5 - 5.1 mEq/L    Chloride 98  96 - 112 mEq/L    CO2 29  19 - 32 mEq/L    Glucose, Bld 100 (*) 70 - 99 mg/dL    BUN 39 (*) 6 - 23 mg/dL    Creatinine, Ser 4.09 (*) 0.50 - 1.10 mg/dL    Calcium 9.8  8.4 - 81.1 mg/dL    GFR calc non Af Amer 35 (*) >90 mL/min    GFR calc Af Amer 40 (*) >90 mL/min   BASIC METABOLIC PANEL     Status: Abnormal   Collection Time   10/15/11  7:00 AM      Component Value Range Comment   Sodium 138  135 - 145 mEq/L    Potassium 4.4  3.5 - 5.1 mEq/L    Chloride 100  96 - 112 mEq/L    CO2 25  19 - 32 mEq/L    Glucose, Bld 79  70 - 99 mg/dL    BUN 32 (*) 6 - 23 mg/dL    Creatinine, Ser 9.14 (*) 0.50 - 1.10 mg/dL    Calcium 9.9  8.4 - 78.2 mg/dL    GFR calc non Af Amer 40 (*) >90 mL/min    GFR calc Af Amer 46 (*) >90 mL/min      HEENT: normal Cardio: RRR Resp: CTA B/L GI: BS positive Extremity:  No Edema Skin:   Intact Neuro: Agitated, Abnormal Sensory reduced on Left side, Abnormal Motor 4/5 L Delt Bi Tri Grip, HF,KE,ADF, Abnormal FMC Ataxic/ dec FMC and Apraxic Musc/Skel:  Normal Mild pain to palpation L thigh, No pain with hip ROM.    Assessment/Plan: 1. Functional deficits secondary to R Parieto-occipital thrombotic infarct which require 3+ hours per day of interdisciplinary therapy in a comprehensive inpatient rehab setting. Physiatrist is  providing close team supervision and 24 hour management of active medical problems listed below. Physiatrist and rehab team continue to assess barriers to discharge/monitor patient progress toward functional and medical goals. FIM: FIM - Bathing Bathing Steps Patient Completed: Chest;Right Arm;Left Arm;Abdomen;Front perineal area;Buttocks;Right upper leg;Left upper leg Bathing: 4: Min-Patient completes 8-9 24f 10 parts or 75+ percent  FIM - Upper Body Dressing/Undressing Upper body dressing/undressing steps patient completed: Thread/unthread right bra strap;Thread/unthread left bra strap;Thread/unthread right sleeve of pullover shirt/dresss;Thread/unthread left sleeve of pullover shirt/dress;Put head through opening of pull over shirt/dress;Pull shirt over trunk;Thread/unthread right sleeve of front closure shirt/dress;Thread/unthread left sleeve of front closure shirt/dress;Pull shirt around back of front closure shirt/dress Upper body dressing/undressing: 4: Min-Patient completed 75 plus % of tasks FIM - Lower Body Dressing/Undressing Lower body dressing/undressing steps patient completed: Thread/unthread right underwear leg;Thread/unthread left underwear leg;Pull underwear up/down;Thread/unthread right pants leg;Thread/unthread left pants leg;Pull pants up/down;Fasten/unfasten pants;Don/Doff right sock;Don/Doff right shoe;Don/Doff left sock;Don/Doff left  shoe Lower body dressing/undressing: 4: Steadying Assist  FIM - Toileting Toileting steps completed by patient: Performs perineal hygiene;Adjust clothing prior to toileting Toileting Assistive Devices: Grab bar or rail for support Toileting: 3: Mod-Patient completed 2 of 3 steps  FIM - Diplomatic Services operational officer Devices: Grab bars Toilet Transfers: 4-From toilet/BSC: Min A (steadying Pt. > 75%);4-To toilet/BSC: Min A (steadying Pt. > 75%)  FIM - Bed/Chair Transfer Bed/Chair Transfer Assistive Devices: Arm rests Bed/Chair  Transfer: 4: Bed > Chair or W/C: Min A (steadying Pt. > 75%);4: Chair or W/C > Bed: Min A (steadying Pt. > 75%)  FIM - Locomotion: Wheelchair Distance: 100 with cues for hand placement, UE propulsion sequence, and for attention to obstacles on L Locomotion: Wheelchair: 0: Activity did not occur FIM - Locomotion: Ambulation Locomotion: Ambulation Assistive Devices: Other (comment) (R HHA ) Ambulation/Gait Assistance: 4: Min assist Locomotion: Ambulation: 4: Travels 150 ft or more with minimal assistance (Pt.>75%)  Comprehension Comprehension Mode: Auditory Comprehension: 5-Understands basic 90% of the time/requires cueing < 10% of the time  Expression Expression Mode: Verbal Expression: 4-Expresses basic 75 - 89% of the time/requires cueing 10 - 24% of the time. Needs helper to occlude trach/needs to repeat words.  Social Interaction Social Interaction: 3-Interacts appropriately 50 - 74% of the time - May be physically or verbally inappropriate.  Problem Solving Problem Solving: 4-Solves basic 75 - 89% of the time/requires cueing 10 - 24% of the time  Memory Memory: 3-Recognizes or recalls 50 - 74% of the time/requires cueing 25 - 49% of the time Medical Problem List and Plan:  1. Right MCA thrombotic infarction  2. DVT Prophylaxis/Anticoagulation: Subcutaneous heparin. Monitor platelet counts any signs of bleeding  3. Mood: History of anxiety as well as schizophrenia. Continue Zyprexa 2.5 mg daily as well as Seroquel 50 mg at bedtime. Provide emotional support and positive reinforcement  4. Neuropsych: This patient is capable of making decisions on his/her own behalf.  5. Hypertension. Coreg 3.125 mg twice a day, Lasix 40 mg daily. Monitor with increased mobility  6. Asthma. Symbicort twice a day, Spiriva daily, Xopenex every 6 hours as needed. Patient currently denies any shortness of breath. Check oxygen saturations every shift  7. 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 Continue aspirin daily. Patient denies any chest pain- check weights, follow i's and o's.  8. CHF. Continue Lasix 40 mg daily. Monitor for any signs of fluid overload  9. Hyperlipidemia. Zocor  10. GERD. Pepcid  11. History of polysubstance abuse. Latest urine drug screen negative 12.  Hip pain check RF, check Xray, may be due to increased tone   LOS (Days) 6 A FACE TO FACE EVALUATION WAS PERFORMED  KIRSTEINS,ANDREW E 10/17/2011, 7:26 AM

## 2011-10-17 NOTE — Progress Notes (Signed)
Occupational Therapy Session Note  Patient Details  Name: [name] MRN: [id number] Date of Birth: [birth year]  Today's Date: 10/17/2011 Time: 1005-1015 Time Calculation (min): 10 min Patient missed 50 minutes due to fatigue.   Short Term Goals: Week 1:  OT Short Term Goal 1 (Week 1): Shower: Min assist sit and stand OT Short Term Goal 2 (Week 1): LB Dressing: Min assist to include sit and stand OT Short Term Goal 3 (Week 1): Toilet Transfer: Min assist OT Short Term Goal 4 (Week 1): Continue to assess and treat Visual Deficits  Skilled Therapeutic Interventions:  Patient resting in bed upon arrival with ~4-5 blankets covering her.  Patient declined out of bed secondary to "I'm tired, I need some more ZZZZ's".  Attempted to encourage patient to participate, showed her the therapy schedule for today and reviewed with her when her other therapies were scheduled, reviewed need for her to eat and drink so she is not dehydrated and tired.  Provided patient with grape juice per her request then made her comfortable so she could rest for her other therapies today.  Patient stating that she is not sleeping well at night and that might be why she feels tired.  Therapy Documentation Precautions:  Precautions Precautions: Fall Precaution Comments: Impulsive and unsafe,  significant visual field impairments difficult to assess formally-has limitations in all visual fields functionally and L inattention vs. l visual field cut, CHF, COPD, CAD, h/o MI, cocaine abuse, Schizophrenia, dementia Restrictions Weight Bearing Restrictions: No Pain: No c/o pain  See FIM for current functional status  Therapy/Group: Individual Therapy  Mendell Bontempo 10/17/2011, 10:35 AM

## 2011-10-17 NOTE — Plan of Care (Signed)
Problem: RH SAFETY Goal: RH STG ADHERE TO SAFETY PRECAUTIONS W/ASSISTANCE/DEVICE STG Adhere to Safety Precautions With Assistance/Device. Supervision Outcome: Not Progressing Pt does not use call bell for assistance. Yells for staffing assist.

## 2011-10-17 NOTE — Progress Notes (Signed)
Physical Therapy Session Note  Patient Details  Name: [name] MRN: [id number] Date of Birth: [birth year]  Today's Date: 10/17/2011 Time: 1100-1200 Time Calculation (min): 60 min  Short Term Goals: No short term goals set  Skilled Therapeutic Interventions/Progress Updates:    cognition- pt wants to be told what to expect ahead of time but even when told she often forgets and then becomes angry; increased agitation when frightened or challenged; impulsive at times, decreased problem solving noted Visual impairments not initially mentioned but became very evident and once lunch arrived pt even had trouble seeing food  Car transfer training min A stand pivot  Gait training with HHA on L or R, unsteady especially at first with ataxic swing on L and significantly decreased weight bearing on LLE in stance and sit to stands; stair training- pt relys heavily on rails and lean significantly to R- without rails on curb pt unsafe and unsteady  Therapy Documentation Precautions:  Precautions Precautions: Fall Precaution Comments: impulsivity Restrictions Weight Bearing Restrictions: No General:   Vital Signs:70 with gait, dypsnea 2/4   Pain: Pain Assessment Pain Assessment: No/denies pain Mobility: Bed Mobility Supine to Sit: 6: Modified independent (Device/Increase time) Transfers Stand Pivot Transfers: 4: Min Actuary Details: Verbal cues for precautions/safety Locomotion : Ambulation Ambulation/Gait Assistance: 4: Min assist (HHA) Gait Gait: Yes Gait Pattern: Impaired Gait Pattern: Step-through pattern;Decreased stance time - left;Wide base of support (abducted and ER LLE) Pelvis - Stance Phase - Impaired Gait Pattern: Decreased weight shift - Left Stairs / Additional Locomotion Stairs: Yes Stairs Assistance: 4: Min assist Stairs Assistance Details: Verbal cues for technique;Verbal cues for precautions/safety Stairs Assistance Details (indicate cue  type and reason): 15, leans to R, missed 1 step with LLE, fiirst trial lead with RLE and second trial lead with LLE to increase L weight shift Stair Management Technique: Step to pattern Curb: 2: Max assist     Other Treatments: Treatments Therapeutic Activity: car transfer training with instruction for technique and safety min A  See FIM for current functional status  Therapy/Group: Individual Therapy  Michaelene Song 10/17/2011, 12:02 PM

## 2011-10-18 ENCOUNTER — Inpatient Hospital Stay (HOSPITAL_COMMUNITY): Payer: Medicare Other

## 2011-10-18 ENCOUNTER — Inpatient Hospital Stay (HOSPITAL_COMMUNITY): Payer: Medicare Other | Admitting: Occupational Therapy

## 2011-10-18 ENCOUNTER — Inpatient Hospital Stay (HOSPITAL_COMMUNITY): Payer: Medicare Other | Admitting: Speech Pathology

## 2011-10-18 NOTE — Progress Notes (Signed)
Physical Therapy Session Note  Patient Details  Name: [name] MRN: [id number] Date of Birth: 29-Dec-1945  Today's Date: 10/18/2011 Time: 1105-1200 Time Calculation (min): 55 min  Short Term Goals: Week 1: same as LTGs        Skilled Therapeutic Interventions/Progress Updates:   Pt stated she needed a snack; drink and crackers provided.  neuromuscular re-education via forced use, demo, tactile and VCs for wt shifting to L LE for stance control and balance.  Therapeutic exercise performed with bil LEs to increase strength for functional mobility, and flexibility LLE for L functional movement.  Gait training with R HHA x 120 ' with facilitation for wt shifting to L, = step lengths, increased LLE stance time.  Pt's gait included L hip ER, limited L knee flexion, uncoordinated movement to progress LLE.  Advanced gait training stepping L while pushing armchairs under table.  L and R foot taps onto 3" high surface, 10 x 1 each.  Pt SOB and fatigued after 10 reps.  L step ups onto surface with min HHA 10 x 1. Pt required seated rest break between each set of 10.  L LE timing and sequencing work for isolated hip flexion vs knee extension.  L hip adduction/abduction with tactile cues for initiation and improving range; pt's AROM L hip abduction limited.  L hamstring stretching via passive positioning in sitting, x 5 minutes.  Pt very fatigued, limiting participation today.      Therapy Documentation Precautions:  Precautions Precautions: Fall Precaution Comments: impulsivity Restrictions Weight Bearing Restrictions: No   Pain: Pain Assessment Pain Assessment: No/denies pain Pain Score: 0-No pain     See FIM for current functional status  Therapy/Group: Individual Therapy  Shron Ozer 10/18/2011, 12:01 PM

## 2011-10-18 NOTE — Progress Notes (Signed)
Occupational Therapy Session Note  Patient Details  Name: [name] MRN: [id number] Date of Birth: 1945-11-15  Today's Date: 10/18/2011 Time: 1000-1100 Time Calculation (min): 60 min  Short Term Goals: Week 1:  OT Short Term Goal 1 (Week 1): Shower: Min assist sit and stand OT Short Term Goal 2 (Week 1): LB Dressing: Min assist to include sit and stand OT Short Term Goal 3 (Week 1): Toilet Transfer: Min assist OT Short Term Goal 4 (Week 1): Continue to assess and treat Visual Deficits  Skilled Therapeutic Interventions:  Patient in w/c upon arrival stating she would like to take a shower and change clothes.  Patient's sister was called by this clinician on 8/22 asking for family to bring personal items.  These items have not yet arrived.  After shower, patient put on clothes that she has been wearing for a few days. Focus session on safety while ambulating to and from the bathroom and to locate items in her room, visual scanning and attention as well as attempts to assess patient's vision more closely.  While wearing her glasses, patient unable to make out letters, words or symbols when placed at midline or when patient moved her head to try and locate. Patient states, "they move around...they keep moving" and "they are fuzzy".  After shower, dress and groom tasks, patient called her sister with assist from this clinician.  No answer therefore, patient left a message on her sister's voice mail to bring some personal items.   Therapy Documentation Precautions:  Precautions Precautions: Fall Precaution Comments: impulsivity Restrictions Weight Bearing Restrictions: No Pain: Pain Assessment Pain Assessment: No/denies pain  See FIM for current functional status  Therapy/Group: Individual Therapy  Anaiah Mcmannis 10/18/2011, 12:25 PM

## 2011-10-18 NOTE — Progress Notes (Signed)
Occupational Therapy Session Note  Patient Details  Name: [name] MRN: [id number] Date of Birth: 09-13-1945  Today's Date: 10/18/2011 Time: 6578-4696 Time Calculation (min): 33 min  Skilled Therapeutic Interventions/Progress Updates:    Worked on sit to stand transitions and standing balance for increased activation of the LLE.  Pt with severely impaired motor planning and impersistence when attempting to get her to use her LLE.  Tends to cross the RLE over and behind the left at times to help with balance.  Decreased stance time as well on the LLE when attempting to transfer form wheelchair to mat.  Needs mod facilitation to perform transfer stand pivot with max demonstrational cueing.  Impulsive at times attempting to stand before therapist is in position to help her as well.  Severe increased lean to the right in standing with decreased ability to weight shift to the left.  Therapy Documentation Precautions:  Precautions Precautions: Fall Precaution Comments: impulsivity Restrictions Weight Bearing Restrictions: No Pain: Pain Assessment Pain Assessment: No/denies pain ADL:  See FIM for current functional status  Therapy/Group: Individual Therapy  [name] OTR/L 10/18/2011, 3:59 PM

## 2011-10-18 NOTE — Progress Notes (Signed)
Patient ID: [name], female   DOB: 1945/11/06, 66 y.o.   MRN: [id number]  Subjective/Complaints: No c/o L hip and thigh pain.   Objective: Vital Signs: Blood pressure 115/71, pulse 55, temperature 97.5 F (36.4 C), temperature source Oral, resp. rate 16, height 5\' 1"  (1.549 m), weight 44.2 kg (97 lb 7.1 oz), SpO2 96.00%. Dg Hip Complete Left  10/17/2011  *RADIOLOGY REPORT*  Clinical Data: Left hip pain.  LEFT HIP - COMPLETE 2+ VIEW  Comparison: None.  Findings: No fracture, dislocation, significant arthropathy or bony lesion is identified.  No evidence of bony destruction.  The rest of the visualized pelvis is unremarkable.  No soft tissue abnormalities are identified.  IMPRESSION: Normal left hip radiographs.   Original Report Authenticated By: Reola Calkins, M.D.    Results for orders placed during the hospital encounter of 10/11/11 (from the past 72 hour(s))  RHEUMATOID FACTOR     Status: Normal   Collection Time   10/17/11  8:49 AM      Component Value Range Comment   Rheumatoid Factor <10  <=14 IU/mL   CBC WITH DIFFERENTIAL     Status: Abnormal   Collection Time   10/17/11  8:49 AM      Component Value Range Comment   WBC 3.0 (*) 4.0 - 10.5 K/uL    RBC 4.73  3.87 - 5.11 MIL/uL    Hemoglobin 14.5  12.0 - 15.0 g/dL    HCT 19.1  47.8 - 29.5 %    MCV 91.8  78.0 - 100.0 fL    MCH 30.7  26.0 - 34.0 pg    MCHC 33.4  30.0 - 36.0 g/dL    RDW 62.1  30.8 - 65.7 %    Platelets 229  150 - 400 K/uL    Neutrophils Relative 49  43 - 77 %    Neutro Abs 1.4 (*) 1.7 - 7.7 K/uL    Lymphocytes Relative 36  12 - 46 %    Lymphs Abs 1.1  0.7 - 4.0 K/uL    Monocytes Relative 12  3 - 12 %    Monocytes Absolute 0.4  0.1 - 1.0 K/uL    Eosinophils Relative 1  0 - 5 %    Eosinophils Absolute 0.0  0.0 - 0.7 K/uL    Basophils Relative 2 (*) 0 - 1 %    Basophils Absolute 0.1  0.0 - 0.1 K/uL      HEENT: normal Cardio: RRR Resp: CTA B/L GI: BS positive Extremity:  No Edema Skin:    Intact Neuro: Agitated, Abnormal Sensory reduced on Left side, Abnormal Motor 4/5 L Delt Bi Tri Grip, HF,KE,ADF, Abnormal FMC Ataxic/ dec FMC and Apraxic Musc/Skel:  Normal Mild pain to palpation L thigh, No pain with hip ROM.    Assessment/Plan: 1. Functional deficits secondary to R Parieto-occipital thrombotic infarct which require 3+ hours per day of interdisciplinary therapy in a comprehensive inpatient rehab setting. Physiatrist is providing close team supervision and 24 hour management of active medical problems listed below. Physiatrist and rehab team continue to assess barriers to discharge/monitor patient progress toward functional and medical goals. FIM: FIM - Bathing Bathing Steps Patient Completed: Chest;Right Arm;Left Arm;Abdomen;Front perineal area;Buttocks;Right upper leg;Left upper leg Bathing: 4: Min-Patient completes 8-9 42f 10 parts or 75+ percent  FIM - Upper Body Dressing/Undressing Upper body dressing/undressing steps patient completed: Thread/unthread right bra strap;Thread/unthread left bra strap;Thread/unthread right sleeve of pullover shirt/dresss;Thread/unthread left sleeve of pullover shirt/dress;Put head through  opening of pull over shirt/dress;Pull shirt over trunk;Thread/unthread right sleeve of front closure shirt/dress;Thread/unthread left sleeve of front closure shirt/dress;Pull shirt around back of front closure shirt/dress Upper body dressing/undressing: 4: Min-Patient completed 75 plus % of tasks FIM - Lower Body Dressing/Undressing Lower body dressing/undressing steps patient completed: Thread/unthread right underwear leg;Thread/unthread left underwear leg;Pull underwear up/down;Thread/unthread right pants leg;Thread/unthread left pants leg;Pull pants up/down;Fasten/unfasten pants;Don/Doff right sock;Don/Doff right shoe;Don/Doff left sock;Don/Doff left shoe Lower body dressing/undressing: 4: Steadying Assist  FIM - Toileting Toileting steps completed by  patient: Performs perineal hygiene;Adjust clothing prior to toileting Toileting Assistive Devices: Grab bar or rail for support Toileting: 3: Mod-Patient completed 2 of 3 steps  FIM - Diplomatic Services operational officer Devices: Grab bars Toilet Transfers: 4-From toilet/BSC: Min A (steadying Pt. > 75%);4-To toilet/BSC: Min A (steadying Pt. > 75%)  FIM - Bed/Chair Transfer Bed/Chair Transfer Assistive Devices: Arm rests Bed/Chair Transfer: 6: Supine > Sit: No assist;4: Bed > Chair or W/C: Min A (steadying Pt. > 75%)  FIM - Locomotion: Wheelchair Distance: 100 with cues for hand placement, UE propulsion sequence, and for attention to obstacles on L Locomotion: Wheelchair: 1: Total Assistance/staff pushes wheelchair (Pt<25%) FIM - Locomotion: Ambulation Locomotion: Ambulation Assistive Devices: Other (comment) (R HHA ) Ambulation/Gait Assistance: 4: Min assist (HHA) Locomotion: Ambulation: 4: Travels 150 ft or more with minimal assistance (Pt.>75%)  Comprehension Comprehension Mode: Auditory Comprehension: 5-Understands complex 90% of the time/Cues < 10% of the time  Expression Expression Mode: Verbal Expression: 3-Expresses basic 50 - 74% of the time/requires cueing 25 - 50% of the time. Needs to repeat parts of sentences.  Social Interaction Social Interaction: 4-Interacts appropriately 75 - 89% of the time - Needs redirection for appropriate language or to initiate interaction.  Problem Solving Problem Solving: 4-Solves basic 75 - 89% of the time/requires cueing 10 - 24% of the time  Memory Memory: 2-Recognizes or recalls 25 - 49% of the time/requires cueing 51 - 75% of the time Medical Problem List and Plan:  1. Right MCA thrombotic infarction  2. DVT Prophylaxis/Anticoagulation: Subcutaneous heparin. Monitor platelet counts any signs of bleeding  3. Mood: History of anxiety as well as schizophrenia. Continue Zyprexa 2.5 mg daily as well as Seroquel 50 mg at bedtime.  Provide emotional support and positive reinforcement  4. Neuropsych: This patient is capable of making decisions on his/her own behalf.  5. Hypertension. Coreg 3.125 mg twice a day, Lasix 40 mg daily. Monitor with increased mobility  6. Asthma. Symbicort twice a day, Spiriva daily, Xopenex every 6 hours as needed. Patient currently denies any shortness of breath. Check oxygen saturations every shift  7. 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 Continue aspirin daily. Patient denies any chest pain- check weights, follow i's and o's.  8. CHF. Continue Lasix 40 mg daily. Monitor for any signs of fluid overload  9. Hyperlipidemia. Zocor  10. GERD. Pepcid  11. History of polysubstance abuse. Latest urine drug screen negative 12.  Hip painXray neg, may be due to increased tone   LOS (Days) 7 A FACE TO FACE EVALUATION WAS PERFORMED  Zerek Litsey E 10/18/2011, 7:11 AM

## 2011-10-18 NOTE — Progress Notes (Signed)
Speech Language Pathology Daily Session Note  Patient Details  Name: [name] MRN: [id number] Date of Birth: [birth year]  Today's Date: 10/18/2011 Time: 9147-8295 Time Calculation (min): 40 min  Short Term Goals: Week 1: SLP Short Term Goal 1 (Week 1): Patient will demonstrate orientation to new situation with mod assist semantic cues  SLP Short Term Goal 1 - Progress (Week 1): Progressing toward goal SLP Short Term Goal 2 (Week 1): Patient will request help as needed during basic, functional tasks with mod assist semantic cues  SLP Short Term Goal 2 - Progress (Week 1): Progressing toward goal SLP Short Term Goal 3 (Week 1): Patient will demonstrate basic problem solving with familiar tasks with mod assist multi-modal cues SLP Short Term Goal 3 - Progress (Week 1): Progressing toward goal SLP Short Term Goal 4 (Week 1): Patient will increase speech intelligibility at the sentence level of verbal expression with mod assist verbal  SLP Short Term Goal 4 - Progress (Week 1): Progressing toward goal  Skilled Therapeutic Interventions: Treatment session focused on addressing cognition; SLP facilitated session with min assist to perform transfer to toilet and min assist verbal and tactile cues to locate toilet paper to perform hygiene after patient requested help.  SLP also facilitated session with supervision verbal cues to sequence washing hands and face.  Patient verbally expressed inability to see during breakfast tray set up and SLP provided increased wait time and supervision verbal cues to locate items on tray and self-feed.     FIM:  Comprehension Comprehension Mode: Auditory Comprehension: 5-Understands basic 90% of the time/requires cueing < 10% of the time Expression Expression Mode: Verbal Expression: 3-Expresses basic 50 - 74% of the time/requires cueing 25 - 50% of the time. Needs to repeat parts of sentences. Social Interaction Social Interaction: 4-Interacts  appropriately 75 - 89% of the time - Needs redirection for appropriate language or to initiate interaction. Problem Solving Problem Solving: 4-Solves basic 75 - 89% of the time/requires cueing 10 - 24% of the time Memory Memory: 2-Recognizes or recalls 25 - 49% of the time/requires cueing 51 - 75% of the time FIM - Eating Eating Activity: 5: Set-up assist for open containers  Pain Pain Assessment Pain Assessment: No/denies pain Pain Score: 0-No pain  Therapy/Group: Individual Therapy  Charlane Ferretti., CCC-SLP [phone]  Lottie Siska 10/18/2011, 11:12 AM

## 2011-10-18 NOTE — Progress Notes (Addendum)
Nutrition Follow-up  Intervention:   1. Continue to encourage meals and supplements as able. 2. Consider appetite stimulant for ongoing poor PO intake. 3. RD to continue to follow nutrition care plan.  Assessment:   Intake of meals is mostly 10 - 50%. Pt eating very little of her meals per nurse tech. Pt did not engage in conversation with this RD. Pt continuously yelling about how she needed to use the restroom.  Diet Order:  Regular Supplement: Ensure Complete PO BID  Meds: Scheduled Meds:   . aspirin  81 mg Oral Daily  . budesonide-formoterol  2 puff Inhalation BID  . carvedilol  3.125 mg Oral BID WC  . clopidogrel  75 mg Oral Daily  . famotidine  20 mg Oral BID  . feeding supplement  237 mL Oral BID BM  . heparin  5,000 Units Subcutaneous Q8H  . OLANZapine  2.5 mg Oral QHS  . QUEtiapine  50 mg Oral QHS  . simvastatin  20 mg Oral QHS  . tiotropium  18 mcg Inhalation Daily   Continuous Infusions:  PRN Meds:.acetaminophen, levalbuterol, nitroGLYCERIN, ondansetron (ZOFRAN) IV, ondansetron, polyethylene glycol, senna-docusate, sorbitol  Labs:  CMP     Component Value Date/Time   NA 138 10/15/2011 0700   K 4.4 10/15/2011 0700   CL 100 10/15/2011 0700   CO2 25 10/15/2011 0700   GLUCOSE 79 10/15/2011 0700   BUN 32* 10/15/2011 0700   CREATININE 1.35* 10/15/2011 0700   CALCIUM 9.9 10/15/2011 0700   PROT 7.9 10/12/2011 0913   ALBUMIN 3.8 10/12/2011 0913   AST 43* 10/12/2011 0913   ALT 20 10/12/2011 0913   ALKPHOS 90 10/12/2011 0913   BILITOT 0.2* 10/12/2011 0913   GFRNONAA 40* 10/15/2011 0700   GFRAA 46* 10/15/2011 0700     Intake/Output Summary (Last 24 hours) at 10/18/11 1156 Last data filed at 10/18/11 0900  Gross per 24 hour  Intake    360 ml  Output      0 ml  Net    360 ml  BM 8/23  Weight Status:  97 lb - stable  Body mass index is 18.41 kg/(m^2). Underweight.  Re-estimated needs:  1450 - 1650 kcal, 65 - 75 grams protein  Nutrition Dx:  Malnutrition r/t suspected  chronic illness AEB severe wasting of her orbital fat pads and severe wasting of the temporalis muscle.   Goal: Pt to meet >/= 90% of their estimated nutrition needs; not met  Monitor:  Po intake, weights, labs, I/Os  Jarold Motto MS, RD, LDN Pager: [phone] After-hours pager: [phone]

## 2011-10-19 ENCOUNTER — Inpatient Hospital Stay (HOSPITAL_COMMUNITY): Payer: Medicare Other | Admitting: Speech Pathology

## 2011-10-19 ENCOUNTER — Inpatient Hospital Stay (HOSPITAL_COMMUNITY): Payer: Medicare Other | Admitting: Physical Therapy

## 2011-10-19 ENCOUNTER — Inpatient Hospital Stay (HOSPITAL_COMMUNITY): Payer: Medicare Other | Admitting: Occupational Therapy

## 2011-10-19 MED ORDER — FAMOTIDINE 20 MG PO TABS
20.0000 mg | ORAL_TABLET | Freq: Every day | ORAL | Status: DC
  Administered 2011-10-20 – 2011-10-21 (×2): 20 mg via ORAL
  Filled 2011-10-19 (×3): qty 1

## 2011-10-19 NOTE — Progress Notes (Signed)
Pt c/o chest pain VS- 120/80-68, Dan Finland PA notified, EKG done, O2 applied via [facility] per verbal order, pt stated"my chest pain is gone @1420 "

## 2011-10-19 NOTE — Progress Notes (Signed)
Occupational Therapy Weekly Progress Note  Patient Details  Name: [name] MRN: [id number] Date of Birth: Jun 04, 1945  Today's Date: 10/19/2011 Time: 1000-1100 Time Calculation (min): 60 min  Patient has met 4 of 4 short term goals.  Patient's current level of function is min-supervision for BADL and IADL.  Patient is always "tired and cold" and has declined to participate fully in OT services on one occasion. Patient's sister has been called on 2 occasions by this clinician yet family has not been present for OT services and to clarify patient's baseline level of function, DME needs, or if 24/7 supervision/min assistance will be provided. Patient has made positive gains however continues to demonstrate the following deficits: left inattention vs.left field cut, significant visual deficits, activity tolerance, abnormal tone in LLE and does not like to put weight through LLE in stand and with ambulate, apraxia during BADLs and occasionally with mobility.  Therefore will continue to benefit from skilled OT intervention to enhance overall performance with BADL, iADL and Reduce care partner burden.  Patient progressing toward long term goals..  Continue plan of care.  OT Short Term Goals Week 1:  OT Short Term Goal 1 (Week 1): Shower: Min assist sit and stand OT Short Term Goal 1 - Progress (Week 1): Met OT Short Term Goal 2 (Week 1): LB Dressing: Min assist to include sit and stand OT Short Term Goal 2 - Progress (Week 1): Met OT Short Term Goal 3 (Week 1): Toilet Transfer: Min assist OT Short Term Goal 3 - Progress (Week 1): Met OT Short Term Goal 4 (Week 1): Continue to assess and treat Visual Deficits OT Short Term Goal 4 - Progress (Week 1): Met Week 2:  OT Short Term Goal 1 (Week 2): STG = LTG  Skilled Therapeutic Interventions:  Patient sleeping in bed upon arrival.  Patient given a choice to shower or sponge bathe and patient chose to sponge bathe at EOB.  Self care and IADL  retraining to include bath, dress, groom and laundry.  Focus session on visual scanning, increased use of LUE and able to hook bra today, attention to left, sit><stand, dynamic standing balance, ambulate in room to complete BADL tasks, place her clothes in the washing machine, fold clothes that were in the dryer and delivered the clothes to the patient they belonged to.  "I like doing things for other people, it makes me feel good".  Patient requested back to bed to rest before next therapy.  Therapy Documentation Precautions:  Precautions Precautions: Fall Precaution Comments: impulsivity Restrictions Weight Bearing Restrictions: No  Pain: Occasional c/o left foot pain yet not able to rate the pain.  Question if patient is describing the increased tone that is present in LLE with mobility, stand and ambulate.  See FIM for current functional status  Therapy/Group: Individual Therapy  Abron Neddo 10/19/2011, 11:58 AM

## 2011-10-19 NOTE — Progress Notes (Signed)
Speech Language Pathology Daily Session Note  Patient Details  Name: [name] MRN: [id number] Date of Birth: June 18, 1945  Today's Date: 10/19/2011 Time: 4696-2952 Time Calculation (min): 45 min  Short Term Goals: Week 1: SLP Short Term Goal 1 (Week 1): Patient will demonstrate orientation to new situation with mod assist semantic cues  SLP Short Term Goal 1 - Progress (Week 1): Progressing toward goal SLP Short Term Goal 2 (Week 1): Patient will request help as needed during basic, functional tasks with mod assist semantic cues  SLP Short Term Goal 2 - Progress (Week 1): Progressing toward goal SLP Short Term Goal 3 (Week 1): Patient will demonstrate basic problem solving with familiar tasks with mod assist multi-modal cues SLP Short Term Goal 3 - Progress (Week 1): Progressing toward goal SLP Short Term Goal 4 (Week 1): Patient will increase speech intelligibility at the sentence level of verbal expression with mod assist verbal  SLP Short Term Goal 4 - Progress (Week 1): Progressing toward goal  Skilled Therapeutic Interventions: Treatment session focused on addressing cognition; SLP facilitated session with supervision verbal cues to sequence cleaning dentures and oral care.  Patient demonstrated impaired recall and required mod to max assist verbal cuing to answer basic questions (e.g. why she was in the hospital, what day it was, what she had for breakfast).   SLP facilitated session by orienting patient to time and place with use of repetition throughout therapy.  SLP also facilitated the session with mod to max assist verbal and visual cues to help the patient use a menu to make decisions and plan for lunch.  Patient required min to mod assist verbal cues to remember to lock brakes on wheelchair.  Patient also continues to demonstrate impairments in safety awareness and needs cuing to remind her to wait for help when transferring (e.g. chair to bed).     FIM:   Comprehension Comprehension Mode: Auditory Comprehension: 4-Understands basic 75 - 89% of the time/requires cueing 10 - 24% of the time Expression Expression Mode: Verbal Expression: 4-Expresses basic 75 - 89% of the time/requires cueing 10 - 24% of the time. Needs helper to occlude trach/needs to repeat words. Social Interaction Social Interaction: 4-Interacts appropriately 75 - 89% of the time - Needs redirection for appropriate language or to initiate interaction. Problem Solving Problem Solving: 4-Solves basic 75 - 89% of the time/requires cueing 10 - 24% of the time Memory Memory: 2-Recognizes or recalls 25 - 49% of the time/requires cueing 51 - 75% of the time    Therapy/Group: Individual Therapy  Jackalyn Lombard, Conrad [place]  Graduate Clinician Speech Language Pathology   Page, Joni Reining 10/19/2011, 9:56 AM   The above treatment note was reviewed and SLP in agreement Feliberto Gottron, MA, CCC-SLP

## 2011-10-19 NOTE — Progress Notes (Signed)
Patient ID: [name], female   DOB: [birth year], 66 y.o.   MRN: [id number]  Subjective/Complaints: No c/o L hip and thigh pain.  Feels ok Objective: Vital Signs: Blood pressure 127/81, pulse 68, temperature 98.1 F (36.7 C), temperature source Oral, resp. rate 16, height 5\' 1"  (1.549 m), weight 44.2 kg (97 lb 7.1 oz), SpO2 97.00%. No results found. Results for orders placed during the hospital encounter of 10/11/11 (from the past 72 hour(s))  RHEUMATOID FACTOR     Status: Normal   Collection Time   10/17/11  8:49 AM      Component Value Range Comment   Rheumatoid Factor <10  <=14 IU/mL   CBC WITH DIFFERENTIAL     Status: Abnormal   Collection Time   10/17/11  8:49 AM      Component Value Range Comment   WBC 3.0 (*) 4.0 - 10.5 K/uL    RBC 4.73  3.87 - 5.11 MIL/uL    Hemoglobin 14.5  12.0 - 15.0 g/dL    HCT 40.9  81.1 - 91.4 %    MCV 91.8  78.0 - 100.0 fL    MCH 30.7  26.0 - 34.0 pg    MCHC 33.4  30.0 - 36.0 g/dL    RDW 78.2  95.6 - 21.3 %    Platelets 229  150 - 400 K/uL    Neutrophils Relative 49  43 - 77 %    Neutro Abs 1.4 (*) 1.7 - 7.7 K/uL    Lymphocytes Relative 36  12 - 46 %    Lymphs Abs 1.1  0.7 - 4.0 K/uL    Monocytes Relative 12  3 - 12 %    Monocytes Absolute 0.4  0.1 - 1.0 K/uL    Eosinophils Relative 1  0 - 5 %    Eosinophils Absolute 0.0  0.0 - 0.7 K/uL    Basophils Relative 2 (*) 0 - 1 %    Basophils Absolute 0.1  0.0 - 0.1 K/uL      HEENT: normal Cardio: RRR Resp: CTA B/L GI: BS positive Extremity:  No Edema Skin:   Intact Neuro: Agitated, Abnormal Sensory reduced on Left side, Abnormal Motor 4/5 L Delt Bi Tri Grip, HF,KE,ADF, Abnormal FMC Ataxic/ dec FMC and Apraxic Musc/Skel:  Normal Mild pain to palpation L thigh, No pain with hip ROM.    Assessment/Plan: 1. Functional deficits secondary to R Parieto-occipital thrombotic infarct which require 3+ hours per day of interdisciplinary therapy in a comprehensive inpatient rehab setting. Physiatrist  is providing close team supervision and 24 hour management of active medical problems listed below. Physiatrist and rehab team continue to assess barriers to discharge/monitor patient progress toward functional and medical goals. Team conference FIM: FIM - Bathing Bathing Steps Patient Completed: Chest;Right Arm;Left Arm;Abdomen;Front perineal area;Buttocks;Right upper leg;Left upper leg;Right lower leg (including foot);Left lower leg (including foot) Bathing: 4: Steadying assist  FIM - Upper Body Dressing/Undressing Upper body dressing/undressing steps patient completed: Thread/unthread right bra strap;Thread/unthread left bra strap;Thread/unthread right sleeve of pullover shirt/dresss;Thread/unthread left sleeve of pullover shirt/dress;Put head through opening of pull over shirt/dress;Pull shirt over trunk;Thread/unthread right sleeve of front closure shirt/dress;Thread/unthread left sleeve of front closure shirt/dress;Pull shirt around back of front closure shirt/dress Upper body dressing/undressing: 4: Min-Patient completed 75 plus % of tasks FIM - Lower Body Dressing/Undressing Lower body dressing/undressing steps patient completed: Thread/unthread right underwear leg;Thread/unthread left underwear leg;Pull underwear up/down;Thread/unthread right pants leg;Thread/unthread left pants leg;Pull pants up/down;Fasten/unfasten pants;Don/Doff right sock;Don/Doff right shoe;Don/Doff  left sock;Don/Doff left shoe Lower body dressing/undressing: 4: Steadying Assist  FIM - Toileting Toileting steps completed by patient: Adjust clothing prior to toileting;Performs perineal hygiene;Adjust clothing after toileting Toileting Assistive Devices: Grab bar or rail for support Toileting: 5: Supervision: Safety issues/verbal cues  FIM - Diplomatic Services operational officer Devices: Grab bars Toilet Transfers: 4-From toilet/BSC: Min A (steadying Pt. > 75%);4-To toilet/BSC: Min A (steadying Pt. >  75%)  FIM - Bed/Chair Transfer Bed/Chair Transfer Assistive Devices: Arm rests Bed/Chair Transfer: 0: Activity did not occur  FIM - Locomotion: Wheelchair Distance: 100 with cues for hand placement, UE propulsion sequence, and for attention to obstacles on L Locomotion: Wheelchair: 0: Activity did not occur FIM - Locomotion: Ambulation Locomotion: Ambulation Assistive Devices: Other (comment) (HHA) Ambulation/Gait Assistance: 4: Min assist Locomotion: Ambulation: 2: Travels 50 - 149 ft with minimal assistance (Pt.>75%)  Comprehension Comprehension Mode: Auditory Comprehension: 4-Understands basic 75 - 89% of the time/requires cueing 10 - 24% of the time  Expression Expression Mode: Verbal Expression: 3-Expresses basic 50 - 74% of the time/requires cueing 25 - 50% of the time. Needs to repeat parts of sentences.  Social Interaction Social Interaction: 3-Interacts appropriately 50 - 74% of the time - May be physically or verbally inappropriate.  Problem Solving Problem Solving: 3-Solves basic 50 - 74% of the time/requires cueing 25 - 49% of the time  Memory Memory: 2-Recognizes or recalls 25 - 49% of the time/requires cueing 51 - 75% of the time Medical Problem List and Plan:  1. Right MCA thrombotic infarction  2. DVT Prophylaxis/Anticoagulation: Subcutaneous heparin. Monitor platelet counts any signs of bleeding  3. Mood: History of anxiety as well as schizophrenia. Continue Zyprexa 2.5 mg daily as well as Seroquel 50 mg at bedtime. Provide emotional support and positive reinforcement  4. Neuropsych: This patient is capable of making decisions on his/her own behalf.  5. Hypertension. Coreg 3.125 mg twice a day, Lasix 40 mg daily. Monitor with increased mobility  6. Asthma. Symbicort twice a day, Spiriva daily, Xopenex every 6 hours as needed. Patient currently denies any shortness of breath. Check oxygen saturations every shift  7. 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 Continue aspirin daily. Patient denies any chest pain- check weights, follow i's and o's.  8. CHF. Continue Lasix 40 mg daily. Monitor for any signs of fluid overload  9. Hyperlipidemia. Zocor  10. GERD. Pepcid  11. History of polysubstance abuse. Latest urine drug screen negative 12.  Hip painXray neg, may be due to increased tone   LOS (Days) 8 A FACE TO FACE EVALUATION WAS PERFORMED  [name] E 10/19/2011, 9:27 AM

## 2011-10-19 NOTE — Progress Notes (Signed)
Physical Therapy Note  Patient Details  Name: [name] MRN: [id number] Date of Birth: [birth year] Today's Date: 10/19/2011  11:40-12:03 individual therapy pt denied pain.  Bed mobility I. Pt donned shoes without assistance. Gait to bathroom and in hall 150' with rt. HHA. Pt able to perform static/ dynamic balance minguard assist.  [name] 10/19/2011, 12:41 PM

## 2011-10-19 NOTE — Progress Notes (Signed)
Social Work Patient ID: [name], female   DOB: 02/16/1946, 66 y.o.   MRN: [id number] Met with pt and left message with sister-Lillie to inform team conference and could possibly move up discharge with Family education and training.  Pt would like to go home sooner than Sat, but is aware will need family education. Has all DME from previous admissions.  She wants [facility] has had before for follow up.  Await return call from Sister.

## 2011-10-20 ENCOUNTER — Inpatient Hospital Stay (HOSPITAL_COMMUNITY): Payer: Medicare Other | Admitting: *Deleted

## 2011-10-20 ENCOUNTER — Inpatient Hospital Stay (HOSPITAL_COMMUNITY): Payer: Medicare Other | Admitting: Speech Pathology

## 2011-10-20 ENCOUNTER — Inpatient Hospital Stay (HOSPITAL_COMMUNITY): Payer: Medicare Other

## 2011-10-20 ENCOUNTER — Inpatient Hospital Stay (HOSPITAL_COMMUNITY): Payer: Medicare Other | Admitting: Occupational Therapy

## 2011-10-20 NOTE — Progress Notes (Addendum)
Physical Therapy Session Note  Patient Details  Name: [name] MRN: [id number] Date of Birth: Feb 02, 1946  Today's Date: 10/20/2011 Time: 1100-1155 Time Calculation (min): 55 min  Short Term Goals: See LTG's  Skilled Therapeutic Interventions/Progress Updates:    NMR- tried to use postural control program on [employer] to increase midline orientation as pt keeps most weight shifted R, difficulty seeing screen but pt was able to hold rails and perform single leg stance on LLE; propelled w/c back to room with UE and LE's with instruction to use LLE for forced use  Gait- OT reports pt toe clawing in standing, attempted to have pt stand in barefeet, not successful, then just in socks, not successful so unable to see toes but pt foot with multiple deficits including no active toe movement and very limed ankle ROM- including PF so ? If toe clawing to increase foot flat contact?  Pt stood in shoes and L LE kept flexing off ground, unable to put it on ground even with cues to gait with HHA. Pt questioned what she would do at home if this happened and she said "just go to sleep"- discussed possible intermittent w/c use but pt with problem solving deficits and self acknowledged memory deficits. Pt finally able to gait with RW 160 x 2 with max cues for safe use with obstacles and sidestepping but close S-min steady A- pt did agree she "does better with the RW"; up/down steps leading up with L and down with R for forced use  Pt states she has 1 STE home, practiced with RW and min A with max instructional cues  Therapy Documentation Precautions:  Precautions Precautions: Fall Precaution Comments: impulsivity Restrictions Weight Bearing Restrictions: No Pain:no c/o pain, just LLE fatigue    Locomotion : Ambulation Ambulation/Gait Assistance: 4: Min guard  See FIM for current functional status  Therapy/Group: Individual Therapy  Michaelene Song 10/20/2011, 12:00 PM

## 2011-10-20 NOTE — Progress Notes (Signed)
Physical Therapy Session Note  Patient Details  Name: [name] MRN: [id number] Date of Birth: Jun 13, 1945  Today's Date: 10/20/2011 Time: 1300-1330 Time Calculation (min): 30 min  Short Term Goals:     Week 2: same as LTGs; w/c goal discontinued due to emphasis on ambulation     Skilled Therapeutic Interventions/Progress Updates:  Gait training with min R HHA to improve R step length, fluidity, L stance time, forward progression, x 75', x 300'.  Simulated car transfer with close supervision for L attention, cues.  Standing endurance x 5 minutes while folding towels at table with bil hands, supervision.  Pt has limited WB'ing LLE.  Therapist  attempted to increase LLE wt bearing by placing R foot on stool, but pt was unable to tolerate this, due to fear of falling.  Pt demonstrated good bil use of hands for folding task.     Max VCs to scan on L to find room numbers.  Once standing in front of number plaque, pt was able to read room numbers if standing 6" away, using finger for visual feedback, with extra time.  Pt very fatigued after tx; returned to bed and bed alarm set.     Therapy Documentation Precautions:  Precautions Precautions: Fall Precaution Comments: impulsive Restrictions Weight Bearing Restrictions: No   Pain: Pain Assessment Pain Assessment: No/denies pain   Locomotion : Ambulation Ambulation/Gait Assistance: 4: Min guard      See FIM for current functional status  Therapy/Group: Individual Therapy  Shigeko Manard 10/20/2011, 2:41 PM

## 2011-10-20 NOTE — Progress Notes (Signed)
Social Work Patient ID: [name], female   DOB: [birth year], 67 y.o.   MRN: [id number] Have yet to hear from family regarding coming in for family education.  Have attempted to call again and left a message. I hope they will show up Sat to transport pt home.  Pt aware of family not returning calls but is assured they will come and transport her home. She states: " They had better or I will not be happy."

## 2011-10-20 NOTE — Progress Notes (Signed)
Speech Language Pathology Daily Session Note  Patient Details  Name: LENER VENTRESCA MRN: [id number] Date of Birth: [birth year]  Today's Date: 10/20/2011 Time: 830-915 Time Calculation (min): 45 min  Short Term Goals: Week 1: SLP Short Term Goal 1 (Week 1): Patient will demonstrate orientation to new situation with mod assist semantic cues  SLP Short Term Goal 1 - Progress (Week 1): Progressing toward goal SLP Short Term Goal 2 (Week 1): Patient will request help as needed during basic, functional tasks with mod assist semantic cues  SLP Short Term Goal 2 - Progress (Week 1): Progressing toward goal SLP Short Term Goal 3 (Week 1): Patient will demonstrate basic problem solving with familiar tasks with mod assist multi-modal cues SLP Short Term Goal 3 - Progress (Week 1): Progressing toward goal SLP Short Term Goal 4 (Week 1): Patient will increase speech intelligibility at the sentence level of verbal expression with mod assist verbal  SLP Short Term Goal 4 - Progress (Week 1): Progressing toward goal  Skilled Therapeutic Interventions: Treatment session addressed cognition goals.  SLP facilitated session with min assist to perform transfer to toilet.  Patient located toilet paper to perform hygiene with modified independence.  SLP also facilitated the session with min assist verbal and tactile cues to sequence washing hands and to perform oral care.  Patient verbally acknowledged her visual deficits during a self-feeding task, and requested help to locate items on her tray appropriately.   SLP facilitated session by providing increased wait time and supervision level verbal cues to locate items on the tray.     FIM:  Comprehension Comprehension Mode: Auditory Comprehension: 5-Understands basic 90% of the time/requires cueing < 10% of the time Expression Expression Mode: Verbal Expression: 3-Expresses basic 50 - 74% of the time/requires cueing 25 - 50% of the time. Needs to repeat parts of  sentences. Social Interaction Social Interaction: 5-Interacts appropriately 90% of the time - Needs monitoring or encouragement for participation or interaction. Problem Solving Problem Solving: 5-Solves basic 90% of the time/requires cueing < 10% of the time Memory Memory: 2-Recognizes or recalls 25 - 49% of the time/requires cueing 51 - 75% of the time FIM - Eating Eating Activity: 5: Set-up assist for open containers;5: Supervision/cues   Therapy/Group: Individual Therapy  Jackalyn Lombard, Conrad [place]  Graduate Clinician Speech Language Pathology  Page, Joni Reining 10/20/2011, 12:30 PM  The above skilled treatment note has been reviewed and SLP is in agreement. Fae Pippin, M.A., CCC-SLP [phone]

## 2011-10-20 NOTE — Progress Notes (Signed)
Patient ID: [name], female   DOB: 03-08-1945, 66 y.o.   MRN: [id number]  Subjective/Complaints: No c/o L hip and thigh pain.  Feels ok Objective: Vital Signs: Blood pressure 110/65, pulse 49, temperature 97.9 F (36.6 C), temperature source Oral, resp. rate 16, height 5\' 1"  (1.549 m), weight 44.2 kg (97 lb 7.1 oz), SpO2 100.00%. No results found. Results for orders placed during the hospital encounter of 10/11/11 (from the past 72 hour(s))  RHEUMATOID FACTOR     Status: Normal   Collection Time   10/17/11  8:49 AM      Component Value Range Comment   Rheumatoid Factor <10  <=14 IU/mL   CBC WITH DIFFERENTIAL     Status: Abnormal   Collection Time   10/17/11  8:49 AM      Component Value Range Comment   WBC 3.0 (*) 4.0 - 10.5 K/uL    RBC 4.73  3.87 - 5.11 MIL/uL    Hemoglobin 14.5  12.0 - 15.0 g/dL    HCT 40.9  81.1 - 91.4 %    MCV 91.8  78.0 - 100.0 fL    MCH 30.7  26.0 - 34.0 pg    MCHC 33.4  30.0 - 36.0 g/dL    RDW 78.2  95.6 - 21.3 %    Platelets 229  150 - 400 K/uL    Neutrophils Relative 49  43 - 77 %    Neutro Abs 1.4 (*) 1.7 - 7.7 K/uL    Lymphocytes Relative 36  12 - 46 %    Lymphs Abs 1.1  0.7 - 4.0 K/uL    Monocytes Relative 12  3 - 12 %    Monocytes Absolute 0.4  0.1 - 1.0 K/uL    Eosinophils Relative 1  0 - 5 %    Eosinophils Absolute 0.0  0.0 - 0.7 K/uL    Basophils Relative 2 (*) 0 - 1 %    Basophils Absolute 0.1  0.0 - 0.1 K/uL      HEENT: normal Cardio: RRR Resp: CTA B/L GI: BS positive Extremity:  No Edema Skin:   Intact Neuro: Agitated, Abnormal Sensory reduced on Left side, Abnormal Motor 4/5 L Delt Bi Tri Grip, HF,KE,ADF, Abnormal FMC Ataxic/ dec FMC and Apraxic Musc/Skel:  Normal Mild pain to palpation L thigh, No pain with hip ROM.    Assessment/Plan: 1. Functional deficits secondary to R Parieto-occipital thrombotic infarct which require 3+ hours per day of interdisciplinary therapy in a comprehensive inpatient rehab setting. Physiatrist  is providing close team supervision and 24 hour management of active medical problems listed below. Physiatrist and rehab team continue to assess barriers to discharge/monitor patient progress toward functional and medical goals. Team conference FIM: FIM - Bathing Bathing Steps Patient Completed: Chest;Right Arm;Left Arm;Abdomen;Front perineal area;Buttocks;Right upper leg;Left upper leg;Right lower leg (including foot);Left lower leg (including foot) Bathing: 5: Set-up assist to: Obtain items  FIM - Upper Body Dressing/Undressing Upper body dressing/undressing steps patient completed: Thread/unthread right bra strap;Thread/unthread left bra strap;Thread/unthread right sleeve of pullover shirt/dresss;Thread/unthread left sleeve of pullover shirt/dress;Put head through opening of pull over shirt/dress;Pull shirt over trunk;Hook/unhook bra Upper body dressing/undressing: 4: Min-Patient completed 75 plus % of tasks FIM - Lower Body Dressing/Undressing Lower body dressing/undressing steps patient completed: Thread/unthread right underwear leg;Thread/unthread left underwear leg;Pull underwear up/down;Thread/unthread right pants leg;Thread/unthread left pants leg;Pull pants up/down;Fasten/unfasten pants;Don/Doff right sock;Don/Doff right shoe;Don/Doff left sock;Don/Doff left shoe Lower body dressing/undressing: 5: Set-up assist to: Obtain clothing  FIM -  Toileting Toileting steps completed by patient: Adjust clothing prior to toileting;Performs perineal hygiene;Adjust clothing after toileting Toileting Assistive Devices: Grab bar or rail for support Toileting: 5: Supervision: Safety issues/verbal cues  FIM - Diplomatic Services operational officer Devices: Grab bars Toilet Transfers: 4-From toilet/BSC: Min A (steadying Pt. > 75%);4-To toilet/BSC: Min A (steadying Pt. > 75%)  FIM - Bed/Chair Transfer Bed/Chair Transfer Assistive Devices: Arm rests Bed/Chair Transfer: 0: Activity did not  occur  FIM - Locomotion: Wheelchair Distance: 100 with cues for hand placement, UE propulsion sequence, and for attention to obstacles on L Locomotion: Wheelchair: 0: Activity did not occur FIM - Locomotion: Ambulation Locomotion: Ambulation Assistive Devices:  (HHA) Ambulation/Gait Assistance: 4: Min assist Locomotion: Ambulation: 2: Travels 50 - 149 ft with minimal assistance (Pt.>75%)  Comprehension Comprehension Mode: Auditory Comprehension: 5-Understands basic 90% of the time/requires cueing < 10% of the time  Expression Expression Mode: Verbal Expression: 3-Expresses basic 50 - 74% of the time/requires cueing 25 - 50% of the time. Needs to repeat parts of sentences.  Social Interaction Social Interaction: 4-Interacts appropriately 75 - 89% of the time - Needs redirection for appropriate language or to initiate interaction.  Problem Solving Problem Solving: 4-Solves basic 75 - 89% of the time/requires cueing 10 - 24% of the time  Memory Memory: 2-Recognizes or recalls 25 - 49% of the time/requires cueing 51 - 75% of the time Medical Problem List and Plan:  1. Right MCA thrombotic infarction  2. DVT Prophylaxis/Anticoagulation: Subcutaneous heparin. Monitor platelet counts any signs of bleeding  3. Mood: History of anxiety as well as schizophrenia. Continue Zyprexa 2.5 mg daily as well as Seroquel 50 mg at bedtime. Provide emotional support and positive reinforcement  4. Neuropsych: This patient is capable of making decisions on his/her own behalf.  5. Hypertension. Coreg 3.125 mg twice a day, Lasix 40 mg daily. Monitor with increased mobility  6. Asthma. Symbicort twice a day, Spiriva daily, Xopenex every 6 hours as needed. Patient currently denies any shortness of breath. Check oxygen saturations every shift  7. 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 Continue aspirin daily. Patient denies any chest pain- check weights, follow i's and o's.   8. CHF. Continue Lasix 40 mg daily. Monitor for any signs of fluid overload  9. Hyperlipidemia. Zocor  10. GERD. Pepcid  11. History of polysubstance abuse. Latest urine drug screen negative 12.  Hip painXray neg, no c/o today   LOS (Days) 9 A FACE TO FACE EVALUATION WAS PERFORMED  KIRSTEINS,ANDREW E 10/20/2011, 7:41 AM

## 2011-10-20 NOTE — Patient Care Conference (Signed)
Inpatient RehabilitationTeam Conference Note Date: 10/19/2011   Time: 10:55 AM    Patient Name: [name]      Medical Record Number: [id number]  Date of Birth: January 08, 1946 Sex: Female         Room/Bed: 4028/4028-01 Payor Info: Payor: MEDICARE  Plan: MEDICARE PART A AND B  Product Type: *No Product type*     Admitting Diagnosis: R MCA CVA  Admit Date/Time:  10/11/2011  1:30 PM Admission Comments: No comment available   Primary Diagnosis:  CVA (cerebral infarction) Principal Problem: CVA (cerebral infarction)  Patient Active Problem List   Diagnosis Date Noted  . CVA (cerebral infarction) 10/11/2011  . Chest pain at rest 04/08/2011    Class: Acute  . Hypertension   . Arthralgia   . Breast cancer, stage 2 02/10/2011  . Trouble swallowing   . Breast cancer 01/28/2011  . CHF 12/25/2009  . Cerebral embolism with cerebral infarction 05/26/2009  . 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 LIVER FUNCTION TESTS, ABNORMAL, HX OF 05/14/2009  . CONSTIPATION 04/13/2009  . HYPERLIPIDEMIA 04/06/2009  . SCHIZOPHRENIA 04/06/2009  . ANXIETY 04/06/2009  . COCAINE ABUSE 04/06/2009  . HYPERTENSION, BENIGN ESSENTIAL 04/06/2009  . MYOCARDIAL INFARCTION 04/06/2009  . ASTHMA 04/06/2009  . COPD 04/06/2009  . CHOLELITHIASIS 04/06/2009  . RENAL FAILURE, ACUTE 04/06/2009  . CAD 03/31/2009    Expected Discharge Date: Expected Discharge Date: 10/22/11  Team Members Present: Physician: Dr. Claudette Laws Social Worker Present: Dossie Der, LCSW Nurse Present: Rosalio Macadamia, RN PT Present: Vincente Liberty, PTA;Edman Circle, PT OT Present: Leonette Monarch, OT SLP Present: Fae Pippin, SLP Other (Discipline and Name): Jacqlyn Larsen Coordinator     Current Status/Progress Goal Weekly Team Focus  Medical   education improving  continue current medical management  prepare for discharge   Bowel/Bladder   continent of bowel/bladder, using the bathroom with min assist  remain continent of  bowel and bladder  monitor for constipation.   Swallow/Nutrition/ Hydration             ADL's   Overall Min - Supervision Assist  Overall Supervision - Min Assist  Vision, activity tolerance, family education   Mobility   min assist overall, hand-hold assist ; has participated to the best of her ability; limited by cardiovascular status  supervision-min A overall   LLE neuro-red, activity tolerance, L attention to environment, transfers, gait, safety, family ed if possible   Communication   mod assist   min assist   increase recall and carryover of monitoring   Safety/Cognition/ Behavioral Observations  mod assist  min assist  increase recall of new deficits and safety awareness   Pain   complain of headache occational, has tylenol prn  pain of 2 or less on scale of 0-10; monitor the effectiveness of med.  free of pain   Skin   n/a  n/a  n/a      *See Interdisciplinary Assessment and Plan and progress notes for long and short-term goals  Barriers to Discharge: difficult to contact caregivers    Possible Resolutions to Barriers:  continue working with social work    Discharge Planning/Teaching Needs:  Hme with fmaily members providing care-at times may be alone.  Trying to get family in to go through family education      Team Discussion:  Starting to plateau, need family education for training.  Has been difficult to get family to call back let alone come in. Pt is participating in therapies.  Revisions to Treatment Plan:  Could move up discharge date if family comes in for family education   Continued Need for Acute Rehabilitation Level of Care: The patient requires daily medical management by a physician with specialized training in physical medicine and rehabilitation for the following conditions: Daily direction of a multidisciplinary physical rehabilitation program to ensure safe treatment while eliciting the highest outcome that is of practical value to the patient.:  Yes Daily medical management of patient stability for increased activity during participation in an intensive rehabilitation regime.: Yes Daily analysis of laboratory values and/or radiology reports with any subsequent need for medication adjustment of medical intervention for : Neurological problems  Pietrina Jagodzinski, Lemar Livings 10/20/2011, 8:16 AM

## 2011-10-21 ENCOUNTER — Inpatient Hospital Stay (HOSPITAL_COMMUNITY): Payer: Medicare Other

## 2011-10-21 ENCOUNTER — Inpatient Hospital Stay (HOSPITAL_COMMUNITY): Payer: Medicare Other | Admitting: *Deleted

## 2011-10-21 ENCOUNTER — Inpatient Hospital Stay (HOSPITAL_COMMUNITY): Payer: Medicare Other | Admitting: Speech Pathology

## 2011-10-21 ENCOUNTER — Encounter (HOSPITAL_COMMUNITY): Payer: Medicare Other | Admitting: Occupational Therapy

## 2011-10-21 MED ORDER — OLANZAPINE 2.5 MG PO TABS: 2.5000 mg | ORAL_TABLET | Freq: Every day | ORAL | Status: DC

## 2011-10-21 MED ORDER — QUETIAPINE FUMARATE 50 MG PO TABS: 50.0000 mg | ORAL_TABLET | Freq: Every day | ORAL | Status: AC

## 2011-10-21 NOTE — Progress Notes (Signed)
Physical Therapy Discharge Summary  Patient Details  Name: [name] MRN: [id number] Date of Birth: [birth year]  Today's Date: 10/21/2011 Time: 1305-1400 Time Calculation (min): 55 min  Patient has met 7 of 8 long term goals due to improved activity tolerance, improved balance, improved postural control, ability to compensate for deficits, functional use of  left upper extremity and left lower extremity and improved attention.  Patient to discharge at an ambulatory level Supervision.   Patient's care partner unavailable to provide the necessary cognitive assistance at discharge; SW left many messages for family requesting that they come in for training, which they never did.  Pt reports that family is always there; she is not left alone.  Although this therapist believes that pt's gait pattern would be more efficient and cosmetic with hand-hold assist, pt demonstrated safe ambulation in home setting on carpet, and 200' on tile in controlled environment, with VCs for route finding, close supervision and assuring pt that she would not fall. PTA, pt was a "furniture walker" and will probably return to that method in the familiar environment at home.  Reasons goals not met: independent dynamic sitting balance not achieved due to visual deficits and safety regarding L inattention.    Recommendation:  Patient will benefit from ongoing skilled PT services in home health setting to continue to advance safe functional mobility, address ongoing impairments in activity tolerance, balance, strength, muscle timing and sequencing, awareness, memory, and minimize fall risk.  Equipment: No equipment provided; pt has w/c and RW from previous admissions  Reasons for discharge: discharge from hospital; most LTGs achieved  Patient/family agrees with progress made and goals achieved: Yes  PT Discharge Precautions/Restrictions Precautions Precautions: Fall Precaution Comments: Significant visual deficits  and decreased cognition affect her safety Restrictions Weight Bearing Restrictions: No Vital Signs Oxygen Therapy SpO2: 98 % Pain Pain Assessment Pain Assessment: No/denies pain Vision/Perception  Vision - History Baseline Vision: Wears glasses only for reading (wears bifocals) Visual History:  (patient is poor historian) Patient Visual Report:  (patient is sometimes aware of her significant visual deficit) Vision - Assessment Alignment/Gaze Preference: Gaze right Tracking/Visual Pursuits: Decreased smoothness of horizontal tracking Additional Comments: Unable to formally assess. When patient tried to read or locate items on a piece of paper, she reports that the letters and items move around so she can't make them out.  Functionally during BADL takes, patient  has learned to adapt if she has a routine and  is familiar with the  items she needs to manipulate (ex. her clothes, her ltube of lipstick, her shoes) , she is successful. Perception Perception: Impaired Inattention/Neglect: Does not attend to left visual field Spatial Orientation: Inititally, patient with significant impairment.  Now that patient has learned routine and hospital environment, she is successful ~80% of the time with her BADL tasks.   Praxis Praxis: Impaired Praxis Impairment Details: Motor planning  Cognition Overall Cognitive Status: Impaired Orientation Level: Oriented X4 Sensation Sensation Light Touch: Impaired Detail (pt describes LLE as "numb") Light Touch Impaired Details: Impaired LUE Hot/Cold: Impaired Detail Hot/Cold Impaired Details: Impaired LUE;Impaired LLE Proprioception: Not tested (due to cognition; appears impaired) Coordination Gross Motor Movements are Fluid and Coordinated: No Fine Motor Movements are Fluid and Coordinated: No Coordination and Movement Description: Not tested formally, noted during BADL tasks Motor  Motor Motor: Hemiplegia;Abnormal postural alignment and control    Mobility Bed Mobility Bed Mobility: Rolling Right;Rolling Left;Supine to Sit;Sit to Supine Rolling Right: 7: Independent Rolling Left: 7: Independent Left Sidelying  to Sit: 7: Independent Supine to Sit: 7: Independent Sitting - Scoot to Edge of Bed: 5: Supervision Transfers Sit to Stand: 5: Supervision Stand to Sit: 5: Supervision Stand Pivot Transfers: 5: Supervision Stand Pivot Transfer Details (indicate cue type and reason): given visual cue Locomotion  Ambulation Ambulation: Yes Ambulation/Gait Assistance: 5: Supervision Ambulation Distance (Feet): 200 Feet Assistive device: None Gait Gait: Yes Gait Pattern: Within Functional Limits Gait Pattern: Decreased stance time - left;Decreased step length - right;Decreased hip/knee flexion - left;Decreased dorsiflexion - left;Wide base of support (L hip abducted and externally rotated) ;Step-to pattern;Step-through pattern Pelvis - Stance Phase - Impaired Gait Pattern: Decreased weight shift - Left Stairs / Additional Locomotion Stairs: Yes Stairs Assistance: 5: Supervision Stair Management Technique: Two rails;Step to pattern Number of Stairs: 5  Height of Stairs: 8   Trunk/Postural Assessment  Cervical Assessment Cervical Assessment: Within Functional Limits Thoracic Assessment Thoracic Assessment: Within Functional Limits Lumbar Assessment Lumbar Assessment: Exceptions to WFL (decreased wt bearing L in sitting and standing) Postural Control Postural Control: Deficits on evaluation (limited ankle and hip strategies)  Balance Static Sitting Balance Static Sitting - Balance Support: Feet supported Static Sitting - Level of Assistance: 7: Independent Dynamic Sitting Balance Dynamic Sitting - Level of Assistance: 5: Stand by assistance Static Standing Balance Static Standing - Balance Support: No upper extremity supported Static Standing - Level of Assistance: 5: Stand by assistance Dynamic Standing Balance Dynamic  Standing - Balance Support: No upper extremity supported Dynamic Standing - Level of Assistance: 5: Stand by assistance Dynamic Standing - Balance Activities: Lateral lean/weight shifting;Reaching for objects (bil UE task at table) Dynamic Standing - Comments: balance improves after a few seconds of pt becoming accustomed to LLE wt bearing Extremity Assessment  RUE Assessment RUE Assessment: Within Functional Limits LUE Assessment LUE Assessment: Exceptions to [facility] (arthritic deformity noted in fingers) LUE AROM (degrees) LUE Overall AROM Comments: Grossly WFL except shoulder flexion ~120 degrees LUE Strength LUE Overall Strength Comments: overall 4/5 RLE Assessment RLE Assessment: Within Functional Limits LLE Assessment LLE Assessment:  (OT reported L toe clawing 100% of time when LLE is wt bearin) LLE Strength LLE Overall Strength Comments: functional for household gait ; not tested  Treatment today:   Spoke with SW again; family has not returned phone calls to set up family ed.  Dynamic sitting balance to don shoes and socks on EOB, with supervision for safety due to visual deficits and safety concerns.    Gait training with R HHA fading to close supervision, x 200' in controlled environment, x 30' on carpet and tile in home environment, with cues for indicating location of doorways, etc, and some route finding.  Pt's gait deviations decrease with time as she bears wt on LLE.    Toilet transfer in ADL apartment with supervision and use of L rail; hygiene and pulled down pants with supervision; supervision to pull up pants, min assist to zip due to SOB.   Hand washing in standing with supervision, VCs for location of soap dispenser and towels.    Gait up/down 5 steps with 2 rails, step through ascending, step-to leading with R descending.  Floor transfer with close supervision and use of love seat.   Gait x 130' to return to room with close supervision.   Pt required frequent  seated rest breaks due to dyspnea 3/4.      See FIM for current functional status  Tomeeka Plaugher 10/21/2011, 2:30 PM

## 2011-10-21 NOTE — Plan of Care (Signed)
Problem: RH Vision Goal: RH LTG Vision (Specify) Outcome: Not Applicable Date Met:  10/21/11 No vision goal set initially  Problem: RH Attention Goal: LTG Patient will demonstrate focused/sustained (OT) LTG: Patient will demonstrate focused/sustained/selective/alternating/divided attention during functional activities in specific environment with assist for # of minutes (OT)  Outcome: Not Applicable Date Met:  10/21/11 No goal set initially  Problem: RH Awareness Goal: LTG: Patient will demonstrate intellectual/emergent (OT) LTG: Patient will demonstrate intellectual/emergent/anticipatory awareness with assist during a functional activity (OT)  Outcome: Not Applicable Date Met:  10/21/11 No goal set initially  Problem: RH Pre-functional (Specify) Goal: RH LTG Pre-functional (Specify) Outcome: Completed/Met Date Met:  10/21/11 No goal set initially

## 2011-10-21 NOTE — Progress Notes (Signed)
Occupational Therapy Discharge Summary  Patient Details  Name: [name] MRN: [id number] Date of Birth: 04-Feb-1946  Today's Date: 10/21/2011 Time: 1000-1115 Time Calculation (min): 75 min  Patient has met 7 of 7 long term goals due to improved activity tolerance, improved balance, ability to compensate for deficits and functional use of  LEFT upper extremity.  Patient to discharge at overall Supervision to [facility] assist level due to weakness, deficits with vision, cognition and balance.  Patient's family has been called numerous times yet has not come in for patient education (see Child psychotherapist notes).  Occupational therapy has not seen any family members since admission.    Reasons goals not met: n/a secondary to all LTGs met  Recommendation:  Patient will benefit from ongoing skilled OT services in home health setting to continue to advance functional skills in the area of BADL, iADL and Reduce care partner burden.  Equipment: Patient has all DME needed from previous Inpatient Admission  Reasons for discharge: treatment goals met and discharge from hospital  Patient agrees with progress made and goals achieved: Yes  OT Discharge Precautions/Restrictions  Precautions Precautions: Fall Precaution Comments: Significant visual deficits and decreased cognition affect her safety Restrictions Weight Bearing Restrictions: No Vital Signs Therapy Vitals Pulse Rate: 70  BP: 128/70 mmHg Oxygen Therapy SpO2: 98 % Pain Pain Assessment Pain Assessment: No/denies pain Vision/Perception  Vision - History Baseline Vision: Wears glasses only for reading (wears bifocals) Visual History:  (patient is poor historian) Patient Visual Report:  (patient is sometimes aware of her significant visual deficit) Vision - Assessment Alignment/Gaze Preference: Gaze right Tracking/Visual Pursuits: Decreased smoothness of horizontal tracking Additional Comments: Unable to formally assess. When  patient tried to read or locate items on a piece of paper, she reports that the letters and items move around so she can't make them out.  Functionally during BADL takes, patient  has learned to adapt if she has a routine and  is familiar with the  items she needs to manipulate (ex. her clothes, her ltube of lipstick, her shoes) , she is successful. Perception Perception: Impaired Inattention/Neglect: Does not attend to left visual field Spatial Orientation: Inititally, patient with significant impairment.  Now that patient has learned routine and hospital environment, she is successful ~80% of the time with her BADL tasks.   Praxis Praxis: Impaired Praxis Impairment Details: Motor planning  Cognition Overall Cognitive Status: Impaired Arousal/Alertness: Awake/alert Orientation Level: Oriented to person;Disoriented to place;Disoriented to time;Disoriented to situation Focused Attention: Appears intact Sustained Attention: Impaired Memory: Impaired Memory Impairment: Storage deficit;Retrieval deficit;Decreased recall of new information;Decreased long term memory;Decreased short term memory Awareness: Impaired Awareness Impairment: Intellectual impairment Problem Solving: Impaired Executive Function:  (all impaired given lower level deficits) Safety/Judgment: Impaired Sensation Sensation Light Touch: Impaired Detail Light Touch Impaired Details: Impaired LUE Hot/Cold: Impaired Detail Hot/Cold Impaired Details: Impaired LUE;Impaired LLE Coordination Gross Motor Movements are Fluid and Coordinated: No Fine Motor Movements are Fluid and Coordinated: No Coordination and Movement Description: Not tested formally, noted during BADL tasks Motor  Motor Motor: Hemiplegia;Abnormal postural alignment and control Mobility  Bed Mobility Bed Mobility: Rolling Right;Rolling Left;Supine to Sit;Sit to Supine Rolling Right: 7: Independent Rolling Left: 7: Independent Left Sidelying to Sit: 7:  Independent Supine to Sit: 7: Independent Sitting - Scoot to Edge of Bed: 5: Supervision Transfers Sit to Stand: 5: Supervision Stand to Sit: 5: Supervision  Trunk/Postural Assessment  Cervical Assessment Cervical Assessment: Within Functional Limits Thoracic Assessment Thoracic Assessment: Within Functional Limits Lumbar  Assessment Lumbar Assessment: Exceptions to [facility] (decreased wt bearing L in sitting and standing) Postural Control Postural Control: Deficits on evaluation (limited ankle and hip strategies)  Balance Static Sitting Balance Static Sitting - Balance Support: Feet supported Static Sitting - Level of Assistance: 7: Independent Dynamic Sitting Balance Dynamic Sitting - Level of Assistance: 5: Stand by assistance Static Standing Balance Static Standing - Balance Support: No upper extremity supported Static Standing - Level of Assistance: 5: Stand by assistance Dynamic Standing Balance Dynamic Standing - Balance Support: No upper extremity supported Dynamic Standing - Level of Assistance: 5: Stand by assistance Dynamic Standing - Balance Activities: Lateral lean/weight shifting;Reaching for objects (bil UE task at table) Dynamic Standing - Comments: balance improves after a few seconds of pt becoming accustomed to LLE wt bearing Extremity/Trunk Assessment RUE Assessment RUE Assessment: Within Functional Limits LUE Assessment LUE Assessment: Exceptions to [facility] (arthritic deformity noted in fingers) LUE AROM (degrees) LUE Overall AROM Comments: Grossly WFL except shoulder flexion ~120 degrees LUE Strength LUE Overall Strength Comments: overall 4/5  See FIM for current functional status  Dreshawn Hendershott 10/21/2011, 4:59 PM

## 2011-10-21 NOTE — Discharge Summary (Signed)
[name], GIOVANNETTI NO.:  [id number]  MEDICAL RECORD NO.:  [id number]  LOCATION:  4028                         FACILITY:  [facility]  PHYSICIAN:  Erick Colace, M.D.DATE OF BIRTH:  1946/02/09  DATE OF ADMISSION:  10/11/2011 DATE OF DISCHARGE:  10/22/2011                              DISCHARGE SUMMARY   DISCHARGE DIAGNOSES: 1. Thrombotic right middle cerebral artery infarct. 2. Subcutaneous heparin for deep vein thrombosis prophylaxis. 3. Schizophrenia. 4. Hypertension. 5. Asthma. 6. 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 7. Hyperlipidemia.  HOSPITAL COURSE:  This is a 66 year old right-handed African American female with coronary artery disease, myocardial infarction secondary to cocaine abuse, May 21, 2010, nonischemic dilated cardiomyopathy, as well as history of stroke with left hemiparesis, Jul 11, 2010.  Admitted October 06, 2011, with altered mental status and slurred speech.  MRI of the brain showed acute right MCA posterior division infarct with petechial hemorrhage and mild mass effect.  Also, chronic left MCA, right ACA, and left PCA infarcts.  MRA of the head with no stenosis. Echocardiogram with ejection fraction of 15% with diffuse hypokinesis. No source of embolism.  Carotid Dopplers with no obvious ICA stenosis. The patient did not receive tPA.  Maintained on aspirin, Plavix for CVA prophylaxis.  Subcutaneous heparin added for DVT prophylaxis.  Venous Doppler studies, lower extremities October 10, 2011, negative for DVT. The patient was admitted for a comprehensive rehab program.  PAST MEDICAL HISTORY:  See discharge diagnoses.  SOCIAL HISTORY:  Lives alone, 1 level home, 1 step to entry.  FUNCTIONAL HISTORY PRIOR TO ADMISSION:  Needed some assistance for meal preparation and light housekeeping.  She was able to navigate stairs. She did not drive.  FUNCTIONAL STATUS UPON  ADMISSION TO REHABILITATION SERVICES:  Minimal assist to ambulate 80 feet with a rolling walker.  PHYSICAL EXAMINATION:  VITAL SIGNS:  Blood pressure 128/89, pulse 56, temperature 97.6, respirations 18. GENERAL:  This was an alert female.  She names person, place, and age. She could name familiar objects. HEENT:  Mild left facial droop.  Poor dentition.  Pupils round and reactive to light. LUNGS:  Clear to auscultation. CARDIAC:  Rate controlled. ABDOMEN:  Soft, nontender.  Good bowel sounds.  REHABILITATION HOSPITAL COURSE:  The patient was admitted to inpatient rehab services with therapies initiated on a 3-hour daily basis consisting of physical therapy, occupational therapy, speech therapy, and rehabilitation nursing.  The following issues were addressed during the patient's rehabilitation stay.  Pertaining to Mr. Weatherspoon's right MCA thrombotic infarction, she remained on aspirin, Plavix therapy. Subcutaneous heparin for DVT prophylaxis.  She did receive venous Doppler studies that were negative.  She had a history of schizophrenia maintained on Zyprexa as well as Seroquel.  She was cooperative and attending therapies.  Blood pressures controlled with no orthostatic changes.  She would follow up with Dr. Sharyn Lull.  She did have a history of asthma.  She continued on her Symbicort and Spiriva.  No shortness of breath noted.  No chest pain or shortness of breath.  She exhibited no signs of fluid overload.  The patient received weekly collaborative interdisciplinary team conferences to  discuss estimated length of stay, family teaching, and any barriers to her discharge.  She was continent of bowel and bladder.  Overall, minimal assist, supervision assist for activities of daily living, minimal assist overall for mobility.  She was able to communicate her needs.  Plan was for family teaching with family education need be completed prior to discharge home and home health therapies would  be recommended.  DISCHARGE MEDICATIONS AT THE TIME OF DICTATION: 1. Aspirin 81 mg daily. 2. Symbicort 2 puffs twice daily. 3. Coreg 3.125 mg twice daily. 4. Plavix 75 mg daily. 5. Pepcid 20 mg at bedtime. 6. Zyprexa 2.5 mg at bedtime. 7. Seroquel 50 mg at bedtime. 8. Zocor 20 mg at bedtime. 9. Spiriva 18 mcg daily.  DIET:  Regular.  SPECIAL INSTRUCTIONS:  The patient would follow up with Dr. Sharyn Lull, medical management.  Arrangement was made to follow up with Dr. Claudette Laws, November 07, 2011, Dr. Delia Heady, Neurology Services, call for appointment 1 month.  Home health therapies had been arranged.     Mariam Dollar, P.A.   ______________________________ Erick Colace, M.D.    DA/MEDQ  D:  10/21/2011  T:  10/21/2011  Job:  [id number]  cc:   Pramod P. Pearlean Brownie, MD Eduardo Osier Sharyn Lull, M.D.

## 2011-10-21 NOTE — Progress Notes (Signed)
Patient ID: [name], female   DOB: 1945/03/10, 66 y.o.   MRN: [id number]  Subjective/Complaints: No c/o L hip and thigh pain.  Feels ok Objective: Vital Signs: Blood pressure 112/67, pulse 50, temperature 97.8 F (36.6 C), temperature source Oral, resp. rate 17, height 5\' 1"  (1.549 m), weight 44.2 kg (97 lb 7.1 oz), SpO2 96.00%. No results found. No results found for this or any previous visit (from the past 72 hour(s)).   HEENT: normal Cardio: RRR Resp: CTA B/L GI: BS positive Extremity:  No Edema Skin:   Intact Neuro: Agitated, Abnormal Sensory reduced on Left side, Abnormal Motor 4/5 L Delt Bi Tri Grip, HF,KE,ADF, Abnormal FMC Ataxic/ dec FMC and Apraxic Musc/Skel:  Normal   Assessment/Plan: 1. Functional deficits secondary to R Parieto-occipital thrombotic infarct should be stable for D/C in am after MD roundsFIM: FIM - Bathing Bathing Steps Patient Completed: Chest;Right Arm;Left Arm;Abdomen;Front perineal area;Buttocks;Right upper leg;Left upper leg;Right lower leg (including foot);Left lower leg (including foot) Bathing: 5: Set-up assist to: Obtain items  FIM - Upper Body Dressing/Undressing Upper body dressing/undressing steps patient completed: Thread/unthread right bra strap;Thread/unthread left bra strap;Thread/unthread right sleeve of pullover shirt/dresss;Thread/unthread left sleeve of pullover shirt/dress;Put head through opening of pull over shirt/dress;Pull shirt over trunk;Hook/unhook bra Upper body dressing/undressing: 4: Min-Patient completed 75 plus % of tasks FIM - Lower Body Dressing/Undressing Lower body dressing/undressing steps patient completed: Thread/unthread right underwear leg;Thread/unthread left underwear leg;Pull underwear up/down;Thread/unthread right pants leg;Thread/unthread left pants leg;Pull pants up/down;Fasten/unfasten pants;Don/Doff right sock;Don/Doff right shoe;Don/Doff left sock;Don/Doff left shoe Lower body dressing/undressing: 5:  Set-up assist to: Obtain clothing  FIM - Toileting Toileting steps completed by patient: Adjust clothing prior to toileting;Performs perineal hygiene;Adjust clothing after toileting Toileting Assistive Devices: Grab bar or rail for support Toileting: 5: Supervision: Safety issues/verbal cues  FIM - Diplomatic Services operational officer Devices: Grab bars Toilet Transfers: 4-From toilet/BSC: Min A (steadying Pt. > 75%);4-To toilet/BSC: Min A (steadying Pt. > 75%)  FIM - Bed/Chair Transfer Bed/Chair Transfer Assistive Devices: Arm rests Bed/Chair Transfer: 6: Sit > Supine: No assist;5: Bed > Chair or W/C: Supervision (verbal cues/safety issues);5: Chair or W/C > Bed: Supervision (verbal cues/safety issues)  FIM - Locomotion: Wheelchair Distance: 100 with cues for hand placement, UE propulsion sequence, and for attention to obstacles on L Locomotion: Wheelchair: 0: Activity did not occur FIM - Locomotion: Ambulation Locomotion: Ambulation Assistive Devices:  (HHA on R) Ambulation/Gait Assistance: 4: Min guard Locomotion: Ambulation: 4: Travels 150 ft or more with minimal assistance (Pt.>75%)  Comprehension Comprehension Mode: Auditory Comprehension: 5-Understands basic 90% of the time/requires cueing < 10% of the time  Expression Expression Mode: Verbal Expression: 3-Expresses basic 50 - 74% of the time/requires cueing 25 - 50% of the time. Needs to repeat parts of sentences.  Social Interaction Social Interaction: 5-Interacts appropriately 90% of the time - Needs monitoring or encouragement for participation or interaction.  Problem Solving Problem Solving: 5-Solves basic 90% of the time/requires cueing < 10% of the time  Memory Memory: 2-Recognizes or recalls 25 - 49% of the time/requires cueing 51 - 75% of the time Medical Problem List and Plan:  1. Right MCA thrombotic infarction  2. DVT Prophylaxis/Anticoagulation: Subcutaneous heparin. Monitor platelet counts any  signs of bleeding  3. Mood: History of anxiety as well as schizophrenia. Continue Zyprexa 2.5 mg daily as well as Seroquel 50 mg at bedtime. Provide emotional support and positive reinforcement  4. Neuropsych: This patient is capable of making decisions on his/her  own behalf.  5. Hypertension. Coreg 3.125 mg twice a day, Lasix 40 mg daily. Monitor with increased mobility  6. Asthma. Symbicort twice a day, Spiriva daily, Xopenex every 6 hours as needed. Patient currently denies any shortness of breath. Check oxygen saturations every shift  7. 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 Continue aspirin daily. Patient denies any chest pain- check weights, follow i's and o's.  8. CHF. Continue Lasix 40 mg daily. Monitor for any signs of fluid overload  9. Hyperlipidemia. Zocor  10. GERD. Pepcid  11. History of polysubstance abuse. Latest urine drug screen negative    LOS (Days) 10 A FACE TO FACE EVALUATION WAS PERFORMED  KIRSTEINS,ANDREW E 10/21/2011, 7:44 AM

## 2011-10-21 NOTE — Progress Notes (Signed)
Physical Therapy Session Note  Patient Details  Name: [name] MRN: [id number] Date of Birth: 1945/11/17  Today's Date: 10/21/2011 Time: 4098-1191 Time Calculation (min): 35 min  Short Term Goals: See d/c goals  Skilled Therapeutic Interventions/Progress Updates:    Pt able to weight bear more easily on LLE today than yesterday and able to gait 150 ft HHA (on R per pt choice) to/from gym.  Stair training with pt self selected pattern 2 rails step to leading up and down with RLE first, min steady A for safety d/t visual deficits.  Pt with dypsnea 3/4 with mobility, rest breaks required d/t impaired activity tolerance. With HHA pt able to do step thru gait pattern but without step to only.  NMR_Nustep for LLE strengthening and control level 3 with LE only 10 min with rest breaks and c/o LLE fatigue; difficulty keeping LLE in neutral rotation. Standing kinetron with increased effort to shift weight to LLE but able on lighter resistance, stood to wash hands with no A for balance at end of session.  Therapy Documentation Precautions:  Precautions Precautions: Fall Precaution Comments: Significant visual deficits and decreased cognition affect her safety Restrictions Weight Bearing Restrictions: No Pain: Pain Assessment Pain Assessment: No/denies pain Mobility:S transfers    See FIM for current functional status  Therapy/Group: Individual Therapy  Michaelene Song 10/21/2011, 11:43 AM

## 2011-10-21 NOTE — Progress Notes (Signed)
Speech Language Pathology Daily Session Note & Discharge Summary   Patient Details  Name: [name] MRN: [id number] Date of Birth: 10-05-1945  Today's Date: 10/21/2011 Time: 3086-5784 Time Calculation (min): 35 min  Clark Term Goals: Week 1: SLP Clark Term Goal 1 (Week 1): Patient will demonstrate orientation to new situation with mod assist semantic cues  SLP Clark Term Goal 1 - Progress (Week 1): Progressing toward goal SLP Clark Term Goal 2 (Week 1): Patient will request help as needed during basic, functional tasks with mod assist semantic cues  SLP Clark Term Goal 2 - Progress (Week 1): Progressing toward goal SLP Clark Term Goal 3 (Week 1): Patient will demonstrate basic problem solving with familiar tasks with mod assist multi-modal cues SLP Clark Term Goal 3 - Progress (Week 1): Progressing toward goal SLP Clark Term Goal 4 (Week 1): Patient will increase speech intelligibility at the sentence level of verbal expression with mod assist verbal  SLP Clark Term Goal 4 - Progress (Week 1): Progressing toward goal  Skilled Therapeutic Interventions: Treatment session focused on basic problem solving with self-care tasks; SLP facilitated session with increase wait time and supervision verbal cues to locate items, sequence tasks and use logic while problem solving.  SLP also facilitated session with hand held assist during mobility tasks which patient only requested in less than 50% of opportunities.  No family present for education during this session or previous sessions.  However, ot os recommended that this patient recieve 24/7 supervision after discharge due to cognitive deficits.   FIM:  Comprehension Comprehension Mode: Auditory Comprehension: 5-Understands basic 90% of the time/requires cueing < 10% of the time Expression Expression Mode: Verbal Expression: 3-Expresses basic 50 - 74% of the time/requires cueing 25 - 50% of the time. Needs to repeat parts of  sentences. Social Interaction Social Interaction: 5-Interacts appropriately 90% of the time - Needs monitoring or encouragement for participation or interaction. Problem Solving Problem Solving: 5-Solves basic 90% of the time/requires cueing < 10% of the time Memory Memory: 2-Recognizes or recalls 25 - 49% of the time/requires cueing 51 - 75% of the time FIM - Eating Eating Activity: 5: Set-up assist for open containers;5: Supervision/cues  Pain Pain Assessment Pain Assessment: No/denies pain  Therapy/Group: Individual Therapy   Speech Language Pathology Discharge Summary  Patient Details  Name: [name] MRN: [id number] Date of Birth: [birth year]  Today's Date: 10/21/2011  Patient has met 2 of 4 long term goals.  Patient to discharge at [facility] level.  Reasons goals not met: patient's participation, willingness and baseline function    Clinical Impression/Discharge Summary: Upon admission patient required max assist for orientation, awareness and basic problem solving.  Though patient's cognition fluctuates at time she has made overall gains during stay.  Patient's motivation and willingness to participate also impact function and abilities from day to day and even therapy session to therapy session.  Overall at discharge patient requires mod assist with speech ineligibility and to request help as needed.  She requires supervision assist with orientation and basic problem solving during familiar tasks.  Patient's baseline function also appears to be a limiting factor at times.  Family education was not completed due to family never in attendance during therapy and patient is unable to recall and utilize strategies without assistance.  As a result it is recommended that patient receive follow up home health therapy to educate family and ensure safety after discharge.   Care Partner:  Caregiver Able to Provide  Assistance: Other (comment) (suspect yes due to deficits at baseline;  however, not presen)  Type of Caregiver Assistance: Physical;Cognitive  Recommendation:  Home Health SLP;24 hour supervision/assistance  Rationale for SLP Follow Up: Maximize cognitive function and independence;Reduce caregiver burden   Equipment: none   Reasons for discharge: Discharged from hospital;Treatment goals met   Patient/Family Agrees with Progress Made and Goals Achieved: Yes   See FIM for current functional status  [name]., CCC-SLP [phone]  [name] 10/21/2011, 11:33 AM

## 2011-10-21 NOTE — Progress Notes (Signed)
Social Work Patient ID: [name], female   DOB: August 16, 1945, 66 y.o.   MRN: [id number] This worker spoke with [name]'s -niece son who reports they are aware pt is being discharged tomorrow and they are coming to pick her up. Informed him of discharge time and he will let his Mom-[name] know.  I left two messages with Lillie-Sister and Porter-Sister to inform of them of Pt's discharge.  Pt feels they will come and pick her up tomorrow.  No family education was completed since no family ever came in.  Home Health arranged And will follow pt at home as they did before in the past.  Pt is ready to go home and will do what she is suppose to.

## 2011-10-21 NOTE — Progress Notes (Signed)
Social Work Discharge Note Discharge Note  The overall goal for the admission was met for:   Discharge location: Yes-HOME WITH FAMILY PROVIDING ASSISTANCE  Length of Stay: Yes-11 DAYS  Discharge activity level: Yes-SUPERVISION/MIN LEVEL  Home/community participation: Yes  Services provided included: MD, RD, PT, OT, RN, CM, TR, Pharmacy and SW  Financial Services: Medicare and Medicaid  Follow-up services arranged: Home Health: [facility]-PT,OT,RN and Patient/Family has no preference for HH/DME agencies  Comments (or additional information):FAMILY WAS CONTACTED ON NUMEROUS OCCASSIONS AND DID NOT COME IN FOR FAMILY EDUCATION. HOPEFUL THEY WILL COME IN TO TRANSPORT PT HOME TOMORROW. PT REPORTS THEY HAD BETTER. IF NOT PT CAN TAKE A TAXI HOME OR IF HERE ON Monday WILL PURSUE NHP-IN WHICH PT REFUSES, HAVE BEEN TO ONE BEFORE. PT IS CAPABLE TO MAKE OWN DECISIONS AND COMPETENT.  SHE IS AWARE OF HER SAFETY ISSUES AND RECOMMENDATIONS NOT TO FURNITURE WALK.  PROBABLY WILL BE ALONE AT TIMES. HIGH RISK TO FALL AT HOME DUE TO BALANCE ISSUES  Patient/Family verbalized understanding of follow-up arrangements: Yes  Individual responsible for coordination of the follow-up plan: PATIENT & RHONDA-NIECE  Confirmed correct DME delivered: Lucy Chris 10/21/2011    Lucy Chris

## 2011-10-21 NOTE — Discharge Summary (Signed)
  Discharge summary job # [id number]

## 2011-10-22 NOTE — Progress Notes (Signed)
Patient ID: [name], female   DOB: 1945/03/03, 66 y.o.   MRN: [id number] Patient ID: [name], female   DOB: [birth year], 66 y.o.   MRN: [id number]  Subjective/Complaints:  8/31.  No complaints. Comfortable night. ENT unremarkable. Chest clear. Cardiovascular normal heart sounds without murmurs. Abdomen soft flat nontender. Extremities no edema.  BP Readings from Last 3 Encounters:  10/22/11 126/78  10/11/11 107/86  06/15/11 116/88    Okay for discharge today   Objective: Vital Signs: Blood pressure 126/78, pulse 52, temperature 97.4 F (36.3 C), temperature source Axillary, resp. rate 20, height 5\' 1"  (1.549 m), weight 44.2 kg (97 lb 7.1 oz), SpO2 96.00%. No results found. No results found for this or any previous visit (from the past 72 hour(s)).   HEENT: normal Cardio: RRR Resp: CTA B/L GI: BS positive Extremity:  No Edema Skin:   Intact Neuro: Agitated, Abnormal Sensory reduced on Left side, Abnormal Motor 4/5 L Delt Bi Tri Grip, HF,KE,ADF, Abnormal FMC Ataxic/ dec FMC and Apraxic Musc/Skel:  Normal   Assessment/Plan: 1. Functional deficits secondary to R Parieto-occipital thrombotic infarct should be stable for D/C in am after MD roundsFIM: FIM - Bathing Bathing Steps Patient Completed: Chest;Right Arm;Left Arm;Abdomen;Front perineal area;Buttocks;Right upper leg;Left upper leg;Right lower leg (including foot);Left lower leg (including foot) Bathing: 5: Set-up assist to: Obtain items  FIM - Upper Body Dressing/Undressing Upper body dressing/undressing steps patient completed: Thread/unthread right bra strap;Thread/unthread left bra strap;Thread/unthread right sleeve of pullover shirt/dresss;Thread/unthread left sleeve of pullover shirt/dress;Put head through opening of pull over shirt/dress;Pull shirt over trunk;Hook/unhook bra Upper body dressing/undressing: 4: Min-Patient completed 75 plus % of tasks FIM - Lower Body Dressing/Undressing Lower body  dressing/undressing steps patient completed: Thread/unthread right underwear leg;Thread/unthread left underwear leg;Pull underwear up/down;Thread/unthread right pants leg;Thread/unthread left pants leg;Pull pants up/down;Fasten/unfasten pants;Don/Doff right sock;Don/Doff right shoe;Don/Doff left sock;Don/Doff left shoe Lower body dressing/undressing: 5: Set-up assist to: Obtain clothing  FIM - Toileting Toileting steps completed by patient: Adjust clothing prior to toileting;Performs perineal hygiene;Adjust clothing after toileting Toileting Assistive Devices: Grab bar or rail for support Toileting: 5: Supervision: Safety issues/verbal cues  FIM - Diplomatic Services operational officer Devices: Grab bars Toilet Transfers: 4-From toilet/BSC: Min A (steadying Pt. > 75%);4-To toilet/BSC: Min A (steadying Pt. > 75%)  FIM - Bed/Chair Transfer Bed/Chair Transfer Assistive Devices: Arm rests Bed/Chair Transfer: 6: Sit > Supine: No assist;5: Bed > Chair or W/C: Supervision (verbal cues/safety issues);5: Chair or W/C > Bed: Supervision (verbal cues/safety issues)  FIM - Locomotion: Wheelchair Distance: 100 with cues for hand placement, UE propulsion sequence, and for attention to obstacles on L Locomotion: Wheelchair: 0: Activity did not occur FIM - Locomotion: Ambulation Locomotion: Ambulation Assistive Devices:  (HHA on R) Ambulation/Gait Assistance: 5: Supervision Locomotion: Ambulation: 4: Travels 150 ft or more with minimal assistance (Pt.>75%)  Comprehension Comprehension Mode: Auditory Comprehension: 5-Understands basic 90% of the time/requires cueing < 10% of the time  Expression Expression Mode: Verbal Expression: 3-Expresses basic 50 - 74% of the time/requires cueing 25 - 50% of the time. Needs to repeat parts of sentences.  Social Interaction Social Interaction: 5-Interacts appropriately 90% of the time - Needs monitoring or encouragement for participation or  interaction.  Problem Solving Problem Solving: 5-Solves basic 90% of the time/requires cueing < 10% of the time  Memory Memory: 2-Recognizes or recalls 25 - 49% of the time/requires cueing 51 - 75% of the time Medical Problem List and Plan:  1. Right MCA thrombotic infarction  2. DVT Prophylaxis/Anticoagulation: Subcutaneous heparin. Monitor platelet counts any signs of bleeding  3. Mood: History of anxiety as well as schizophrenia. Continue Zyprexa 2.5 mg daily as well as Seroquel 50 mg at bedtime. Provide emotional support and positive reinforcement  4. Neuropsych: This patient is capable of making decisions on his/her own behalf.  5. Hypertension. Coreg 3.125 mg twice a day, Lasix 40 mg daily. Monitor with increased mobility  6. Asthma. Symbicort twice a day, Spiriva daily, Xopenex every 6 hours as needed. Patient currently denies any shortness of breath. Check oxygen saturations every shift  7. 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 Continue aspirin daily. Patient denies any chest pain- check weights, follow i's and o's.  8. CHF. Continue Lasix 40 mg daily. Monitor for any signs of fluid overload  9. Hyperlipidemia. Zocor  10. GERD. Pepcid  11. History of polysubstance abuse. Latest urine drug screen negative    LOS (Days) 11 A FACE TO FACE EVALUATION WAS PERFORMED  [name] 10/22/2011, 9:01 AM

## 2011-10-22 NOTE — Progress Notes (Addendum)
Patient ID: [name], female   DOB: [birth year], 66 y.o.   MRN: [id number] Picked up by her brother clifton goins at 530pm. Discharge instructions given to brother.copy of discharge intructions in the chart.

## 2011-10-22 NOTE — Progress Notes (Signed)
Spoke to patient's sister regarding discharge today. She said that her brother will be picking her up today.

## 2011-10-22 NOTE — Plan of Care (Signed)
Problem: RH PAIN MANAGEMENT Goal: RH STG PAIN MANAGED AT OR BELOW PT'S PAIN GOAL Outcome: Completed/Met Date Met:  10/22/11 Pain less than 2

## 2011-11-07 ENCOUNTER — Encounter: Payer: Medicare Other | Attending: Physical Medicine & Rehabilitation

## 2011-11-07 ENCOUNTER — Inpatient Hospital Stay: Payer: Medicare Other | Admitting: Physical Medicine & Rehabilitation

## 2011-11-25 ENCOUNTER — Telehealth: Payer: Self-pay | Admitting: *Deleted

## 2011-11-25 NOTE — Telephone Encounter (Signed)
Needs to speak to RN about patient. Please call.

## 2011-11-25 NOTE — Telephone Encounter (Signed)
Just FYI Diane from [facility] called to inform us that Ms Fischetti had missed 4 out of the 6 appointments for [facility] and would make various excuses as to why she could not accept a visit, so they are discharging her.

## 2011-12-16 ENCOUNTER — Other Ambulatory Visit: Payer: Self-pay | Admitting: Oncology

## 2011-12-16 DIAGNOSIS — Z853 Personal history of malignant neoplasm of breast: Secondary | ICD-10-CM

## 2011-12-26 ENCOUNTER — Telehealth: Payer: Self-pay | Admitting: Physical Medicine & Rehabilitation

## 2011-12-26 NOTE — Telephone Encounter (Signed)
Would like to recert for 1wk/9.  Patient not stabilized out yet.

## 2011-12-27 NOTE — Telephone Encounter (Signed)
Left message to return our call. Voicemail was not identified so no information left other than to [facility].

## 2011-12-27 NOTE — Telephone Encounter (Signed)
Diane called back verbal given to recert.

## 2012-01-27 ENCOUNTER — Ambulatory Visit
Admission: RE | Admit: 2012-01-27 | Discharge: 2012-01-27 | Disposition: A | Discharge status: Home / Self Care | Payer: Medicare Other | Source: Ambulatory Visit | Attending: Oncology | Admitting: Oncology

## 2012-01-27 ENCOUNTER — Other Ambulatory Visit: Payer: Self-pay | Admitting: Oncology

## 2012-01-27 DIAGNOSIS — Z853 Personal history of malignant neoplasm of breast: Secondary | ICD-10-CM

## 2012-06-24 ENCOUNTER — Inpatient Hospital Stay (HOSPITAL_COMMUNITY): Payer: Medicare Other

## 2012-06-24 ENCOUNTER — Inpatient Hospital Stay (HOSPITAL_COMMUNITY)
Admission: EM | Admit: 2012-06-24 | Discharge: 2012-06-27 | DRG: 064 | Disposition: A | Discharge status: Home Health | Payer: Medicare Other | Attending: Internal Medicine | Admitting: Internal Medicine

## 2012-06-24 ENCOUNTER — Emergency Department (HOSPITAL_COMMUNITY): Payer: Medicare Other

## 2012-06-24 ENCOUNTER — Encounter (HOSPITAL_COMMUNITY): Payer: Self-pay | Admitting: *Deleted

## 2012-06-24 DIAGNOSIS — I10 Essential (primary) hypertension: Secondary | ICD-10-CM

## 2012-06-24 DIAGNOSIS — Z880 Allergy status to penicillin: Secondary | ICD-10-CM

## 2012-06-24 DIAGNOSIS — E43 Unspecified severe protein-calorie malnutrition: Secondary | ICD-10-CM | POA: Diagnosis present

## 2012-06-24 DIAGNOSIS — Z8673 Personal history of transient ischemic attack (TIA), and cerebral infarction without residual deficits: Secondary | ICD-10-CM

## 2012-06-24 DIAGNOSIS — Z853 Personal history of malignant neoplasm of breast: Secondary | ICD-10-CM

## 2012-06-24 DIAGNOSIS — Z79899 Other long term (current) drug therapy: Secondary | ICD-10-CM

## 2012-06-24 DIAGNOSIS — F1410 Cocaine abuse, uncomplicated: Secondary | ICD-10-CM | POA: Diagnosis present

## 2012-06-24 DIAGNOSIS — R64 Cachexia: Secondary | ICD-10-CM | POA: Diagnosis present

## 2012-06-24 DIAGNOSIS — I509 Heart failure, unspecified: Secondary | ICD-10-CM | POA: Diagnosis present

## 2012-06-24 DIAGNOSIS — Z87891 Personal history of nicotine dependence: Secondary | ICD-10-CM

## 2012-06-24 DIAGNOSIS — I6350 Cerebral infarction due to unspecified occlusion or stenosis of unspecified cerebral artery: Principal | ICD-10-CM

## 2012-06-24 DIAGNOSIS — Z7902 Long term (current) use of antithrombotics/antiplatelets: Secondary | ICD-10-CM

## 2012-06-24 DIAGNOSIS — E785 Hyperlipidemia, unspecified: Secondary | ICD-10-CM | POA: Diagnosis present

## 2012-06-24 DIAGNOSIS — R471 Dysarthria and anarthria: Secondary | ICD-10-CM | POA: Diagnosis present

## 2012-06-24 DIAGNOSIS — F411 Generalized anxiety disorder: Secondary | ICD-10-CM | POA: Diagnosis present

## 2012-06-24 DIAGNOSIS — I728 Aneurysm of other specified arteries: Secondary | ICD-10-CM

## 2012-06-24 DIAGNOSIS — I639 Cerebral infarction, unspecified: Secondary | ICD-10-CM | POA: Diagnosis present

## 2012-06-24 DIAGNOSIS — J449 Chronic obstructive pulmonary disease, unspecified: Secondary | ICD-10-CM | POA: Diagnosis present

## 2012-06-24 DIAGNOSIS — G4733 Obstructive sleep apnea (adult) (pediatric): Secondary | ICD-10-CM | POA: Diagnosis present

## 2012-06-24 DIAGNOSIS — Z803 Family history of malignant neoplasm of breast: Secondary | ICD-10-CM

## 2012-06-24 DIAGNOSIS — F0390 Unspecified dementia without behavioral disturbance: Secondary | ICD-10-CM | POA: Diagnosis present

## 2012-06-24 DIAGNOSIS — R82998 Other abnormal findings in urine: Secondary | ICD-10-CM | POA: Diagnosis present

## 2012-06-24 DIAGNOSIS — Z8249 Family history of ischemic heart disease and other diseases of the circulatory system: Secondary | ICD-10-CM

## 2012-06-24 DIAGNOSIS — J4489 Other specified chronic obstructive pulmonary disease: Secondary | ICD-10-CM

## 2012-06-24 DIAGNOSIS — F209 Schizophrenia, unspecified: Secondary | ICD-10-CM

## 2012-06-24 DIAGNOSIS — Z681 Body mass index (BMI) 19 or less, adult: Secondary | ICD-10-CM

## 2012-06-24 LAB — URINALYSIS, ROUTINE W REFLEX MICROSCOPIC
Hgb urine dipstick: NEGATIVE
Protein, ur: NEGATIVE mg/dL
Urobilinogen, UA: 0.2 mg/dL (ref 0.0–1.0)

## 2012-06-24 LAB — BASIC METABOLIC PANEL
BUN: 16 mg/dL (ref 6–23)
Calcium: 10.1 mg/dL (ref 8.4–10.5)
Creatinine, Ser: 0.92 mg/dL (ref 0.50–1.10)
GFR calc Af Amer: 73 mL/min — ABNORMAL LOW (ref >90)
GFR calc non Af Amer: 63 mL/min — ABNORMAL LOW (ref >90)
Glucose, Bld: 75 mg/dL (ref 70–99)
Potassium: 4.8 mEq/L (ref 3.5–5.1)

## 2012-06-24 LAB — CBC
HCT: 43.9 % (ref 36.0–46.0)
Hemoglobin: 15.2 g/dL — ABNORMAL HIGH (ref 12.0–15.0)
MCH: 31.1 pg (ref 26.0–34.0)
MCHC: 34.6 g/dL (ref 30.0–36.0)
MCV: 89.8 fL (ref 78.0–100.0)
RDW: 13.5 % (ref 11.5–15.5)

## 2012-06-24 LAB — POCT I-STAT TROPONIN I

## 2012-06-24 LAB — URINE MICROSCOPIC-ADD ON

## 2012-06-24 LAB — POCT I-STAT, CHEM 8
BUN: 17 mg/dL (ref 6–23)
Calcium, Ion: 1.23 mmol/L (ref 1.13–1.30)
Chloride: 104 mEq/L (ref 96–112)
Glucose, Bld: 75 mg/dL (ref 70–99)
HCT: 48 % — ABNORMAL HIGH (ref 36.0–46.0)
Potassium: 4.8 mEq/L (ref 3.5–5.1)

## 2012-06-24 LAB — TROPONIN I: Troponin I: <0.3 ng/mL (ref <0.30)

## 2012-06-24 MED ORDER — FAMOTIDINE 20 MG PO TABS
20.0000 mg | ORAL_TABLET | Freq: Two times a day (BID) | ORAL | Status: DC
  Administered 2012-06-25 – 2012-06-27 (×4): 20 mg via ORAL
  Filled 2012-06-24 (×9): qty 1

## 2012-06-24 MED ORDER — BIOTENE DRY MOUTH MT LIQD
15.0000 mL | Freq: Two times a day (BID) | OROMUCOSAL | Status: DC
  Administered 2012-06-24 – 2012-06-26 (×5): 15 mL via OROMUCOSAL

## 2012-06-24 MED ORDER — HEPARIN SODIUM (PORCINE) 5000 UNIT/ML IJ SOLN
5000.0000 [IU] | Freq: Three times a day (TID) | INTRAMUSCULAR | Status: DC
  Administered 2012-06-24 – 2012-06-27 (×10): 5000 [IU] via SUBCUTANEOUS
  Filled 2012-06-24 (×13): qty 1

## 2012-06-24 MED ORDER — PANTOPRAZOLE SODIUM 40 MG PO TBEC
40.0000 mg | DELAYED_RELEASE_TABLET | Freq: Every day | ORAL | Status: DC
  Administered 2012-06-26 – 2012-06-27 (×2): 40 mg via ORAL
  Filled 2012-06-24 (×2): qty 1

## 2012-06-24 MED ORDER — ALBUTEROL 90 MCG/ACT IN AERS
2.0000 | INHALATION_SPRAY | Freq: Four times a day (QID) | RESPIRATORY_TRACT | Status: DC | PRN
Start: 2012-06-24 — End: 2012-06-24

## 2012-06-24 MED ORDER — CHLORHEXIDINE GLUCONATE 0.12 % MT SOLN
15.0000 mL | Freq: Two times a day (BID) | OROMUCOSAL | Status: DC
  Administered 2012-06-24 – 2012-06-27 (×6): 15 mL via OROMUCOSAL
  Filled 2012-06-24 (×9): qty 15

## 2012-06-24 MED ORDER — ASPIRIN 300 MG RE SUPP
300.0000 mg | Freq: Every day | RECTAL | Status: DC
  Administered 2012-06-24 – 2012-06-25 (×2): 300 mg via RECTAL
  Filled 2012-06-24 (×2): qty 1

## 2012-06-24 MED ORDER — POTASSIUM CHLORIDE CRYS ER 10 MEQ PO TBCR
10.0000 meq | EXTENDED_RELEASE_TABLET | Freq: Every day | ORAL | Status: DC
  Administered 2012-06-26 – 2012-06-27 (×2): 10 meq via ORAL
  Filled 2012-06-24 (×4): qty 1

## 2012-06-24 MED ORDER — ALBUTEROL SULFATE (5 MG/ML) 0.5% IN NEBU
2.5000 mg | INHALATION_SOLUTION | RESPIRATORY_TRACT | Status: DC
  Administered 2012-06-24 (×2): 2.5 mg via RESPIRATORY_TRACT
  Filled 2012-06-24 (×2): qty 0.5

## 2012-06-24 MED ORDER — SODIUM CHLORIDE 0.9 % IV BOLUS (SEPSIS)
500.0000 mL | Freq: Once | INTRAVENOUS | Status: AC
  Administered 2012-06-24: 500 mL via INTRAVENOUS

## 2012-06-24 MED ORDER — OLANZAPINE 2.5 MG PO TABS
2.5000 mg | ORAL_TABLET | Freq: Every day | ORAL | Status: DC
  Administered 2012-06-25 – 2012-06-26 (×2): 2.5 mg via ORAL
  Filled 2012-06-24 (×4): qty 1

## 2012-06-24 MED ORDER — TIOTROPIUM BROMIDE MONOHYDRATE 18 MCG IN CAPS
18.0000 ug | ORAL_CAPSULE | Freq: Every day | RESPIRATORY_TRACT | Status: DC
  Filled 2012-06-24: qty 5

## 2012-06-24 MED ORDER — BUDESONIDE-FORMOTEROL FUMARATE 80-4.5 MCG/ACT IN AERO
2.0000 | INHALATION_SPRAY | Freq: Two times a day (BID) | RESPIRATORY_TRACT | Status: DC
  Administered 2012-06-24 – 2012-06-27 (×6): 2 via RESPIRATORY_TRACT
  Filled 2012-06-24: qty 6.9

## 2012-06-24 MED ORDER — FUROSEMIDE 40 MG PO TABS
40.0000 mg | ORAL_TABLET | Freq: Every day | ORAL | Status: DC
  Filled 2012-06-24: qty 1

## 2012-06-24 MED ORDER — ALBUTEROL SULFATE HFA 108 (90 BASE) MCG/ACT IN AERS: 2.0000 | INHALATION_SPRAY | Freq: Four times a day (QID) | RESPIRATORY_TRACT | Status: DC | PRN

## 2012-06-24 MED ORDER — QUETIAPINE FUMARATE 50 MG PO TABS
50.0000 mg | ORAL_TABLET | Freq: Every day | ORAL | Status: DC
  Administered 2012-06-26 – 2012-06-27 (×2): 50 mg via ORAL
  Filled 2012-06-24 (×4): qty 1

## 2012-06-24 MED ORDER — CIPROFLOXACIN IN D5W 400 MG/200ML IV SOLN
400.0000 mg | Freq: Two times a day (BID) | INTRAVENOUS | Status: DC
  Administered 2012-06-24 – 2012-06-26 (×5): 400 mg via INTRAVENOUS
  Filled 2012-06-24 (×6): qty 200

## 2012-06-24 MED ORDER — CLOPIDOGREL BISULFATE 75 MG PO TABS
75.0000 mg | ORAL_TABLET | Freq: Every day | ORAL | Status: DC
  Administered 2012-06-26 – 2012-06-27 (×2): 75 mg via ORAL
  Filled 2012-06-24 (×4): qty 1

## 2012-06-24 MED ORDER — IPRATROPIUM BROMIDE 0.02 % IN SOLN
0.5000 mg | RESPIRATORY_TRACT | Status: DC
  Administered 2012-06-24 (×2): 0.5 mg via RESPIRATORY_TRACT
  Filled 2012-06-24 (×2): qty 2.5

## 2012-06-24 MED ORDER — METOPROLOL TARTRATE 1 MG/ML IV SOLN
2.5000 mg | Freq: Two times a day (BID) | INTRAVENOUS | Status: DC
  Administered 2012-06-24 – 2012-06-25 (×2): 2.5 mg via INTRAVENOUS
  Filled 2012-06-24 (×3): qty 5

## 2012-06-24 MED ORDER — METOPROLOL TARTRATE 1 MG/ML IV SOLN
2.5000 mg | Freq: Three times a day (TID) | INTRAVENOUS | Status: DC
  Administered 2012-06-24: 2.5 mg via INTRAVENOUS
  Filled 2012-06-24 (×4): qty 5

## 2012-06-24 MED ORDER — SIMVASTATIN 20 MG PO TABS
20.0000 mg | ORAL_TABLET | Freq: Every day | ORAL | Status: DC
  Administered 2012-06-25 – 2012-06-26 (×2): 20 mg via ORAL
  Filled 2012-06-24 (×4): qty 1

## 2012-06-24 MED ORDER — SODIUM CHLORIDE 0.9 % IV SOLN
INTRAVENOUS | Status: DC
  Administered 2012-06-24 – 2012-06-25 (×2): via INTRAVENOUS

## 2012-06-24 MED ORDER — ASPIRIN EC 81 MG PO TBEC
81.0000 mg | DELAYED_RELEASE_TABLET | Freq: Every day | ORAL | Status: DC
  Filled 2012-06-24: qty 1

## 2012-06-24 NOTE — Progress Notes (Signed)
Nursing has questioned the patient's activity level. Dr. Amada Jupiter recommended keeping the patient's head flat and hydrating the patient in his progress note dated 06/24/2012 at 1:50 AM. Dr. Anne Hahn recommended physical, occupational, and speech therapies in his note on 06/24/2012 at 11:17 AM. The therapists find it difficult to work with the patient if she is on strict bedrest with her head flat. As it is already going on 3 PM we will maintain bedrest for now and reassess tomorrow morning.   Delton See PA-C [facility] Pager [phone] 06/24/2012, 2:38 PM

## 2012-06-24 NOTE — ED Notes (Signed)
Admitting MD in room with pt. 

## 2012-06-24 NOTE — H&P (Addendum)
[facility] History and Physical  [name] WJX:[id number] DOB: 10-Jun-1945 DOA: 06/24/2012  Referring physician: ED PCP: Robynn Pane, MD  Specialists: None  Chief Complaint: Dysarthria  HPI: [name] is a 67 y.o. female who presents to the ED with slurred speech that was present on awakening 20 mins after going to bed.  Slurred speech is apparently new though it is very difficult to get further history from the patient due to her being difficult to understand from dysarthria at this point in time.  From her chart and CT scan of her head it is obvious she has had numerous previous strokes.  CT scan in the ED showed no acute findings, hospitalist asked to admit for new CVA.  Review of Systems: Patient states she has some difficulty breathing, otherwise unable to assess due to patients dysarthria.  Past Medical History  Diagnosis Date  . Weight loss, unintentional   . Trouble swallowing   . Change in voice   . Substance abuse   . Dementia   . Asthma   . Shortness of breath   . Seizures     "    It has been along time "  . CHF (congestive heart failure)   . Headache   . Hypertension   . Arthralgia   . Tremors of nervous system   . Anxiety   . Rib fractures     hx of May 2012  . Heart attack 05/21/10  . Schizophrenia   . Coronary artery disease   . Stroke 07/11/10    hx of R CVA   . COPD (chronic obstructive pulmonary disease)   . Obstructive sleep apnea   . 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 Total abdominal hysterectomy  age 67   Social History:  reports that she quit smoking about 4 years ago. Her smoking use included Cigarettes. She has a 50 pack-year smoking history. She has never used smokeless tobacco. She reports that  drinks alcohol. She reports that she uses illicit drugs.   Allergies  Allergen Reactions  . Codeine Itching    All over the body  .  Penicillins Hives and Other (See Comments)    "Whelps" per patient.  . Hydralazine     02/28/11 Family unsure if this is allergy for pt    Family History  Problem Relation Age of Onset  . Breast cancer Mother   . Birth defects Mother     breast  . Cancer Mother     breast  . Heart disease Father     heart attack  . Heart attack Father   . Heart attack Brother   . Cancer Brother     throat, lung  . Cancer Paternal Aunt   . Cancer Maternal Grandmother     breast      Prior to Admission medications   Medication Sig Start Date End Date Taking? Authorizing Provider  albuterol (PROVENTIL) 90 MCG/ACT inhaler Inhale 2 puffs into the lungs every 6 (six) hours as needed. For wheezing    Yes Historical Provider, MD  budesonide-formoterol (SYMBICORT) 80-4.5 MCG/ACT inhaler Inhale 2 puffs into the lungs 2 (two) times daily.   Yes Historical Provider, MD  carvedilol (COREG) 3.125 MG tablet Take 3.125 mg by mouth 2 (two) times daily with a meal.     Yes Historical Provider, MD  clopidogrel (PLAVIX) 75 MG  tablet Take 75 mg by mouth daily.     Yes Historical Provider, MD  furosemide (LASIX) 40 MG tablet Take 40 mg by mouth daily.   Yes Historical Provider, MD  nitroGLYCERIN (NITROSTAT) 0.4 MG SL tablet Place 0.4 mg under the tongue every 5 (five) minutes as needed. For chest pain   Yes Historical Provider, MD  OLANZapine (ZYPREXA) 2.5 MG tablet Take 1 tablet (2.5 mg total) by mouth at bedtime. 10/21/11  Yes Evlyn Kanner Love, PA-C  omeprazole (PRILOSEC) 20 MG capsule Take 20 mg by mouth daily.   Yes Historical Provider, MD  QUEtiapine (SEROQUEL) 50 MG tablet Take 50 mg by mouth daily.   Yes Historical Provider, MD  simvastatin (ZOCOR) 20 MG tablet Take 1 tablet (20 mg total) by mouth at bedtime. 10/11/11 10/10/12 Yes Robynn Pane, MD  aspirin 81 MG tablet Take 1 tablet (81 mg total) by mouth daily. 10/11/11   Robynn Pane, MD  famotidine (PEPCID) 20 MG tablet Take 1 tablet (20 mg total) by mouth 2  (two) times daily. 10/11/11 10/10/12  Robynn Pane, MD  potassium chloride (K-DUR,KLOR-CON) 10 MEQ tablet Take 1 tablet (10 mEq total) by mouth daily. 10/11/11 10/10/12  Robynn Pane, MD  tiotropium (SPIRIVA) 18 MCG inhalation capsule Place 1 capsule (18 mcg total) into inhaler and inhale daily. 10/11/11 10/10/12  Robynn Pane, MD   Physical Exam: Filed Vitals:   06/24/12 0330 06/24/12 0345 06/24/12 0400 06/24/12 0500  BP: 157/123 167/107 163/101   Pulse: 60 89 61   Temp:      TempSrc:      Resp: 23 20 30    SpO2: 100% 91% 100% 98%    General:  NAD, resting comfortably in bed Eyes: PEERLA EOMI ENT: mucous membranes moist Neck: supple w/o JVD Cardiovascular: RRR w/o MRG Respiratory: faint wheezing bilaterally Abdomen: soft, nt, nd, bs+ Skin: no rash nor lesion Musculoskeletal: MAE, full ROM all 4 extremities Psychiatric: normal tone and affect Neurologic: awake, alert, not oriented to place or time, has severe dysarthria on exam, 4/5 strength in LLE, 5/5 throughout otherwise, drooling from R side of mouth noted.  Labs on Admission:  Basic Metabolic Panel:  Recent Labs Lab 06/24/12 0059 06/24/12 0133  NA 141 143  K 4.8 4.8  CL 102 104  CO2 31  --   GLUCOSE 75 75  BUN 16 17  CREATININE 0.92 1.00  CALCIUM 10.1  --    Liver Function Tests: No results found for this basename: AST, ALT, ALKPHOS, BILITOT, PROT, ALBUMIN,  in the last 168 hours No results found for this basename: LIPASE, AMYLASE,  in the last 168 hours No results found for this basename: AMMONIA,  in the last 168 hours CBC:  Recent Labs Lab 06/24/12 0059 06/24/12 0133  WBC 4.4  --   HGB 15.2* 16.3*  HCT 43.9 48.0*  MCV 89.8  --   PLT 211  --    Cardiac Enzymes:  Recent Labs Lab 06/24/12 0059  TROPONINI <0.30    BNP (last 3 results) No results found for this basename: PROBNP,  in the last 8760 hours CBG:  Recent Labs Lab 06/24/12 0059  GLUCAP 84    Radiological Exams on  Admission: Ct Head Wo Contrast  06/24/2012  *RADIOLOGY REPORT*  Clinical Data: Code stroke, dysarthria  CT HEAD WITHOUT CONTRAST  Technique:  Contiguous axial images were obtained from the base of the skull through the vertex without contrast.  Comparison: Prior head  CT 10/09/2011  Findings: No acute intracranial hemorrhage, acute infarction, mass lesion, mass effect, midline shift or hydrocephalus.  Gray-white differentiation is preserved throughout.  Similar appearance of a multi focal cortical encephalomalacia consistent with prior infarctions in the right frontal lobe, right parietal lobe, left posterior frontal lobe, left occipital lobe.  Additionally, there is periventricular, subcortical white matter hypoattenuation consistent with the sequela of longstanding microvascular ischemia. Given the overall extent of underlying chronic infarction, a new acute or subacute focus of ischemia would be difficult to detect. Extensive calcification along the falx, stable.  Globes are intact and orbits unremarkable.  No focal soft tissue or calvarial abnormality.  Normal aeration of the bilateral mastoid air cells and paranasal sinuses.  IMPRESSION:  1.  No acute intracranial abnormality. 2.  Stable appearance of multi focal bilateral remote cortical infarcts, atrophy, ventriculomegaly and sequela of longstanding microvascular ischemia.   Original Report Authenticated By: Malachy Moan, M.D.     EKG: Independently reviewed.  Assessment/Plan Principal Problem:   Acute ischemic stroke Active Problems:   HYPERTENSION, BENIGN ESSENTIAL   COPD   1. Acute ischemic stroke - patient with apparently a new ischemic stroke, clearly patient is at risk for this given the large number of previous infarcts.  Heart failure is a likely source but has been felt previously to be a poor anticoagulation candidate so just on ASA and plavix at this time.  On stroke pathway, 2d echo pending, MRI brain pending, carotid dopplers  pending, lipid panel and A1c ordered.  Tele monitoring.  PT/OT and SLP ordered, suspect she will fail bedside swallow study as drooling is noted on my exam.  Have also ordered blood cultures, UA, and urine cultures as infectious workup as suggested in Dr. Alene Mires note, but other than some wheezing on physical exam and slight tachypnea patient has nothing to suggest infection. 2. COPD - breathing treatments PRN 3. Elevated troponin - troponin of 0.14 is patients baseline going back for at least a year. 4. HTN - hold home meds and allow permissive HTN  Dr. Amada Jupiter saw patient, his note is in chart.  Code Status: Full Code (must indicate code status--if unknown or must be presumed, indicate so) Family Communication: No family present in room (indicate person spoken with, if applicable, with phone number if by telephone) Disposition Plan: Admit to inpatient (indicate anticipated LOS)  Time spent: 70 min  Marguerite Jarboe M. [facility] Pager [phone]  If 7PM-7AM, please contact night-coverage [email or web address] Password [email or web address] 06/24/2012, 5:08 AM

## 2012-06-24 NOTE — Progress Notes (Signed)
A message was sent via transporter when Mrs. [name] arrived for her second attempt for her MRI to please give pt ear plugs. Patient was provided earplugs first time and we added cushions by  The outside of her ears also when trying to appease the pt ; as we could tell by the gestures she was making she didn't care for the noise. Earplugs will be provided to patient for second attempt as they are routinely to all patients.

## 2012-06-24 NOTE — ED Provider Notes (Addendum)
History     CSN: [id number]  Arrival date & time 06/24/12  0059   First MD Initiated Contact with Patient 06/24/12 0110      Chief Complaint  Patient presents with  . Code Stroke   HPI [name] is a 67 y.o. female an extensive medical history including coronary artery disease, stroke (right CVA) and dementia presents with symptoms suggestive of CVA versus TIA, she went to bed about midnight and woke up shortly thereafter with difficulties talking and weakness in her left arm.  Symptoms have been waxing and waning, they've been intermittent, severe, unchanged, no other alleviating or exacerbating factors no other associated symptoms. Denies any chest pain, shortness of breath, headache, fevers chills the   Past Medical History  Diagnosis Date  . Weight loss, unintentional   . Trouble swallowing   . Change in voice   . Substance abuse   . Dementia   . Asthma   . Shortness of breath   . Seizures     "    It has been along time "  . CHF (congestive heart failure)   . Headache   . Hypertension   . Arthralgia   . Tremors of nervous system   . Anxiety   . Rib fractures     hx of May 2012  . Heart attack 05/21/10  . Schizophrenia   . Coronary artery disease   . Stroke 07/11/10    hx of R CVA   . COPD (chronic obstructive pulmonary disease)   . Obstructive sleep apnea   . 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 Total abdominal hysterectomy  age 65    Family History  Problem Relation Age of Onset  . Breast cancer Mother   . Birth defects Mother     breast  . Cancer Mother     breast  . Heart disease Father     heart attack  . Heart attack Father   . Heart attack Brother   . Cancer Brother     throat, lung  . Cancer Paternal Aunt   . Cancer Maternal Grandmother     breast     History  Substance Use Topics  . Smoking status: Former Smoker -- 1.00 packs/day for 50 years     Types: Cigarettes    Quit date: 02/22/2008  . Smokeless tobacco: Never Used     Comment: started smoking at age 28  . Alcohol Use: Yes     Comment: social use    OB History   Grav Para Term Preterm Abortions TAB SAB Ect Mult Living                  Review of Systems At least 10pt or greater review of systems completed and are negative except where specified in the HPI.  Allergies  Codeine; Penicillins; and Hydralazine  Home Medications   Current Outpatient Rx  Name  Route  Sig  Dispense  Refill  . albuterol (PROVENTIL) 90 MCG/ACT inhaler   Inhalation   Inhale 2 puffs into the lungs every 6 (six) hours as needed. For wheezing          . budesonide-formoterol (SYMBICORT) 80-4.5 MCG/ACT inhaler   Inhalation   Inhale 2 puffs into the lungs 2 (two) times daily.         . carvedilol (COREG) 3.125 MG tablet  Oral   Take 3.125 mg by mouth 2 (two) times daily with a meal.           . clopidogrel (PLAVIX) 75 MG tablet   Oral   Take 75 mg by mouth daily.           . furosemide (LASIX) 40 MG tablet   Oral   Take 40 mg by mouth daily.         . nitroGLYCERIN (NITROSTAT) 0.4 MG SL tablet   Sublingual   Place 0.4 mg under the tongue every 5 (five) minutes as needed. For chest pain         . OLANZapine (ZYPREXA) 2.5 MG tablet   Oral   Take 1 tablet (2.5 mg total) by mouth at bedtime.         Marland Kitchen omeprazole (PRILOSEC) 20 MG capsule   Oral   Take 20 mg by mouth daily.         . QUEtiapine (SEROQUEL) 50 MG tablet   Oral   Take 50 mg by mouth daily.         . simvastatin (ZOCOR) 20 MG tablet   Oral   Take 1 tablet (20 mg total) by mouth at bedtime.   30 tablet   3   . aspirin 81 MG tablet   Oral   Take 1 tablet (81 mg total) by mouth daily.   30 tablet   3   . famotidine (PEPCID) 20 MG tablet   Oral   Take 1 tablet (20 mg total) by mouth 2 (two) times daily.   60 tablet   3   . potassium chloride (K-DUR,KLOR-CON) 10 MEQ tablet   Oral    Take 1 tablet (10 mEq total) by mouth daily.   30 tablet   3   . tiotropium (SPIRIVA) 18 MCG inhalation capsule   Inhalation   Place 1 capsule (18 mcg total) into inhaler and inhale daily.   30 capsule   3     BP 163/101  Pulse 61  Temp(Src) 98.7 F (37.1 C) (Oral)  Resp 30  SpO2 100%  Physical Exam  PHYSICAL EXAM: VITAL SIGNS:  . Filed Vitals:   06/24/12 2100 06/24/12 2121 06/25/12 0126 06/25/12 0507  BP:  135/79 155/98 139/92  Pulse:  66 56 62  Temp:  97.9 F (36.6 C) 97.8 F (36.6 C) 97.7 F (36.5 C)  TempSrc:  Oral Oral Oral  Resp:  17 16 16   Height: 5\' 1"  (1.549 m)     Weight: 87 lb 6.4 oz (39.644 kg)     SpO2:  100% 100% 100%   CONSTITUTIONAL: Awake, oriented, appears non-toxic HENT: Atraumatic, normocephalic, oral mucosa pink and moist, airway patent. Nares patent without drainage. External ears normal. EYES: Conjunctiva clear, EOMI, PERRLA NECK: Trachea midline, non-tender, supple CARDIOVASCULAR: Bradycardia, Normal rhythm  PULMONARY/CHEST: Clear to auscultation, no rhonchi, wheezes, or rales. Symmetrical breath sounds. CHEST WALL: No lesions. Non-tender. ABDOMINAL: Non-distended, soft, non-tender - no rebound or guarding.  BS normal. NEUROLOGIC: Patient is mildly dysarthric, not aphasic ZO:XWRUEA fields intact. PERRLA, EOMI.  Facial sensation equal to light touch bilaterally.  Good muscle bulk in the masseter muscle and good lateral movement of the jaw.  Facial expressions equal and good strength with smile/frown and puffed cheeks.  Hearing grossly intact to finger rub test.  Uvula, tongue are midline with no deviation. Symmetrical palate elevation.  Trapezius and SCM muscles are 5/5 strength bilaterally.   DTR: Brachioradialis, biceps, patellar,  Achilles tendon reflexes 2+ bilaterally.  No clonus. Strength: Some mild left-sided weakness  Sensation: Sensation intact distally to light touch EXTREMITIES: No clubbing, cyanosis, or edema SKIN: Warm, Dry, No  erythema, No rash   ED Course  Procedures (including critical care time)  Date: 06/25/2012  Rate: 58  Rhythm: Sinus bradycardia  QRS Axis: Left axis deviation  Intervals: normal  ST/T Wave abnormalities: normal  Conduction Disutrbances: Left bundle branch block  Narrative Interpretation: Left bundle branch block sinus bradycardia no evidence for acute ischemia or infarction Labs Reviewed  CBC - Abnormal; Notable for the following:    Hemoglobin 15.2 (*)    All other components within normal limits  BASIC METABOLIC PANEL - Abnormal; Notable for the following:    GFR calc non Af Amer 63 (*)    GFR calc Af Amer 73 (*)    All other components within normal limits  POCT I-STAT, CHEM 8 - Abnormal; Notable for the following:    Hemoglobin 16.3 (*)    HCT 48.0 (*)    All other components within normal limits  POCT I-STAT TROPONIN I - Abnormal; Notable for the following:    Troponin i, poc 0.13 (*)    All other components within normal limits  PROTIME-INR  APTT  TROPONIN I  GLUCOSE, CAPILLARY   Ct Head Wo Contrast  06/24/2012  *RADIOLOGY REPORT*  Clinical Data: Code stroke, dysarthria  CT HEAD WITHOUT CONTRAST  Technique:  Contiguous axial images were obtained from the base of the skull through the vertex without contrast.  Comparison: Prior head CT 10/09/2011  Findings: No acute intracranial hemorrhage, acute infarction, mass lesion, mass effect, midline shift or hydrocephalus.  Gray-white differentiation is preserved throughout.  Similar appearance of a multi focal cortical encephalomalacia consistent with prior infarctions in the right frontal lobe, right parietal lobe, left posterior frontal lobe, left occipital lobe.  Additionally, there is periventricular, subcortical white matter hypoattenuation consistent with the sequela of longstanding microvascular ischemia. Given the overall extent of underlying chronic infarction, a new acute or subacute focus of ischemia would be difficult to  detect. Extensive calcification along the falx, stable.  Globes are intact and orbits unremarkable.  No focal soft tissue or calvarial abnormality.  Normal aeration of the bilateral mastoid air cells and paranasal sinuses.  IMPRESSION:  1.  No acute intracranial abnormality. 2.  Stable appearance of multi focal bilateral remote cortical infarcts, atrophy, ventriculomegaly and sequela of longstanding microvascular ischemia.   Original Report Authenticated By: Malachy Moan, M.D.      1. Acute ischemic stroke   2. Chronic airway obstruction, not elsewhere classified   3. Essential hypertension, benign       MDM  Patient presents with TIA versus CVA, vision symptoms do get worse and get better in the emergency department, she is having some dysarthria, with some left leg weakness.  In discussion with neurologist, patient is not a TPA candidate. Will admit patient for observation and further therapy. she's currently stable - no evidence for cardiopulmonary disease or infection at this time.        Jones Skene, MD 06/25/12 0740  Jones Skene, MD 06/25/12 4098

## 2012-06-24 NOTE — Code Documentation (Signed)
67 yo bf brought in via [facility] for acute onset slurred speech.  Per EMS report pt laid down at midnight & was found 20 min later with slurred speech.  Code stroke encoded 0048, pt arrival 40, LKW 0000, EDP exam 0057, stroke team arrival 0048, neurologist 0100, pt arrival in CT 0101, phlebotomist arrival 0057, CT read by neurologist 0100. NIH 5 for weak LLE, slurred speech, visual deficits.  Not a tPA candidate due to mild s/s. Monitor VS/Neuro q15/q30 til 0430.  Admit to medicine

## 2012-06-24 NOTE — Consult Note (Addendum)
Reason for Consult: Concern for stroke Referring Physician:  Nichola Sizer  CC: Slurred speech  History is obtained from:patient, medical record.   HPI: [name] is a 67 y.o. female with a history of CHF with multiple previous strokes and dementia who presents with slurred speech that was present on awakening 20 minutes after going to bed(per EMS). The patient is unable to give me any clear history of the night's events, but does state that her slurred speech is new. She has dementia at baseline.   LKW: 12 am, though unclear as no independent source could be reached.  tpa given: no, mild  (new)deficits, unclear time of onset  ROS: Unable to assess secondary to patient's altered mental status.   Past Medical History  Diagnosis Date  . Weight loss, unintentional   . Trouble swallowing   . Change in voice   . Substance abuse   . Dementia   . Asthma   . Shortness of breath   . Seizures     "    It has been along time "  . CHF (congestive heart failure)   . Headache   . Hypertension   . Arthralgia   . Tremors of nervous system   . Anxiety   . Rib fractures     hx of May 2012  . Heart attack 05/21/10  . Schizophrenia   . Coronary artery disease   . Stroke 07/11/10    hx of R CVA   . COPD (chronic obstructive pulmonary disease)   . Obstructive sleep apnea   . Breast cancer 01/28/11    L breast, inv ductal/in situ, ER/PR -, Her2 -    Family History: Unable to assess secondary to patient's altered mental status.   Social History: Unable to assess secondary to patient's altered mental status.   Exam: Current vital signs: BP 163/115  Pulse 63  Temp(Src) 98.7 F (37.1 C) (Oral)  Resp 27  SpO2 100% Vital signs in last 24 hours: Temp:  [98.7 F (37.1 C)] 98.7 F (37.1 C) (05/04 0124) Pulse Rate:  [63-71] 63 (05/04 0134) Resp:  [27-32] 27 (05/04 0134) BP: (163-167)/(104-115) 163/115 mmHg (05/04 0134) SpO2:  [100 %] 100 % (05/04 0134)  General: in bed, nad CV:  RRR Mental Status: Patient is awake, alert, not oriented to place or year.  She appears confused.  Unable to give good account of night's events.  No clear aphasia, but difficult to be certain due to confusion.  No signs of neglect.  Cranial Nerves: II: Visual Fields with right hemianopia. Pupils are equal, round, and reactive to light.  Discs are difficult to visualize. III,IV, VI: EOMI without ptosis or diploplia.  V: Facial sensation is symmetric to temperature VII: Facial movement is symmetric.  VIII: hearing is intact to voice X: Uvula elevates symmetrically XI: Shoulder shrug is symmetric. XII: tongue is midline without atrophy or fasciculations.  Motor: Tone is normal. Bulk is normal. 5/5 strength was present on the right, on the left, she has drift in her left leg, and mild 4/5 weakness. She does not drift in her left arm, but has mild weakness to confrontation.  Sensory: Sensation is symmetric to light touch and pin in the arms and legs. Deep Tendon Reflexes: 2+ and symmetric in the biceps and patellae.  Cerebellar: Reaches for my hand without clear dysmetria, able to perform hks on right, but refuses on left.  Gait: Not tested 2/2 patient safety concerns.   I have reviewed  labs in epic and the results pertinent to this consultation are: BMP unremarkable.   I have reviewed the images obtained:CT head - multiple old infarcts affecting bilateral hemispheres.   Impression: 67 yo F with mutliple medical problems and new onset dysarthria. It is possible that this represents new ischemia, as she is clearly someone at risk for this given the multitude of previous infarcts seen on CT. She has heart failure and this is a likely source, however she has previously been felt to be a poor anticoagulation candidate.   Recommendations: 1. HgbA1c, fasting lipid panel 2. Frequent neuro checks 3. Echocardiogram 4. Carotid dopplers 5. Prophylactic therapy-asa 81mg +plavix 75mg .  6. Risk  factor modification 7. Telemetry monitoring 8. PT consult, OT consult, Speech consult 10. Would also check infectious workup as dysarthria could be unmasking of previous deficits.   Ritta Slot, MD [facility] [phone]  If 7pm- 7am, please page neurology on call at [phone].    Patient's dysarthria significantly worsened after time window for tPA had elapsed. No other signs of worsening stroke, but dysarthria has become severe. At this time, I feel that treatment with fluids and keeping her head flat are the therapies I have to offer, as she is already on dual antiplatelet medication and is not a tPA candidate.   Ritta Slot, MD [facility] [phone]  If 7pm- 7am, please page neurology on call at [phone].

## 2012-06-24 NOTE — Progress Notes (Signed)
[facility] PROGRESS NOTE  [name] ZOX:[id number] DOB: [birth year] DOA: 06/24/2012 PCP: Robynn Pane, MD  Assessment/Plan: Acute ischemic stroke: patient presents with new onset dysarthria. Work up for stroke in process. MRI/MRA. ECHO. Permissive HTN. Work up for infection. Check Chest x ray. Keep head flat per neuro recommendation. Continue with IV fluids. HB-A1c, Lipid panel. Aspirin per rectum until pass swallow evaluation. If she doesn't pass will need NG tube.  COPD - schedule Nebulizer treatments.  Elevated troponin - troponin of 0.13. Patient has had elevated point of care marker increase. Will cycle cardia enzymes. ECHO ordered.  HTN - hold home meds and allow permissive HTN Tachycardia: check EKG. Patient was on coreg at home. Start metoprolol.  Pyuria: will start ciprofloxacin. Fu urine culture.     Code Status: full Family Communication: none at bedside.  Disposition Plan: to be determine.    Consultants:  Neuro  Procedures:  none  Antibiotics:  none  HPI/Subjective: Unable to speak, making sounds. Follows command. Per nurse patient has been like this since she was in the ED.   Objective: Filed Vitals:   06/24/12 0415 06/24/12 0445 06/24/12 0500 06/24/12 0621  BP: 168/149 168/121 180/105 188/100  Pulse: 65 75 67 122  Temp:    97.4 F (36.3 C)  TempSrc:    Oral  Resp: 22 24 24 22   SpO2: 99% 100% 100% 92%   No intake or output data in the 24 hours ending 06/24/12 0759 There were no vitals filed for this visit.  Exam:   General:  No distress.   Cardiovascular: S 1, S 2 RRR  Respiratory: few wheezes, no crackles.   Abdomen: BS present, soft, NT  Musculoskeletal: no edema.   Neuro aphasia, dysarthria. Motor strength  5/5 right. Left side 4-5   Data Reviewed: Basic Metabolic Panel:  Recent Labs Lab 06/24/12 0059 06/24/12 0133  NA 141 143  K 4.8 4.8  CL 102 104  CO2 31  --   GLUCOSE 75 75  BUN 16 17  CREATININE 0.92 1.00   CALCIUM 10.1  --    Liver Function Tests: No results found for this basename: AST, ALT, ALKPHOS, BILITOT, PROT, ALBUMIN,  in the last 168 hours No results found for this basename: LIPASE, AMYLASE,  in the last 168 hours No results found for this basename: AMMONIA,  in the last 168 hours CBC:  Recent Labs Lab 06/24/12 0059 06/24/12 0133  WBC 4.4  --   HGB 15.2* 16.3*  HCT 43.9 48.0*  MCV 89.8  --   PLT 211  --    Cardiac Enzymes:  Recent Labs Lab 06/24/12 0059  TROPONINI <0.30   BNP (last 3 results) No results found for this basename: PROBNP,  in the last 8760 hours CBG:  Recent Labs Lab 06/24/12 0059  GLUCAP 84    No results found for this or any previous visit (from the past 240 hour(s)).   Studies: Ct Head Wo Contrast  06/24/2012  *RADIOLOGY REPORT*  Clinical Data: Code stroke, dysarthria  CT HEAD WITHOUT CONTRAST  Technique:  Contiguous axial images were obtained from the base of the skull through the vertex without contrast.  Comparison: Prior head CT 10/09/2011  Findings: No acute intracranial hemorrhage, acute infarction, mass lesion, mass effect, midline shift or hydrocephalus.  Gray-white differentiation is preserved throughout.  Similar appearance of a multi focal cortical encephalomalacia consistent with prior infarctions in the right frontal lobe, right parietal lobe, left posterior frontal lobe, left occipital  lobe.  Additionally, there is periventricular, subcortical white matter hypoattenuation consistent with the sequela of longstanding microvascular ischemia. Given the overall extent of underlying chronic infarction, a new acute or subacute focus of ischemia would be difficult to detect. Extensive calcification along the falx, stable.  Globes are intact and orbits unremarkable.  No focal soft tissue or calvarial abnormality.  Normal aeration of the bilateral mastoid air cells and paranasal sinuses.  IMPRESSION:  1.  No acute intracranial abnormality. 2.  Stable  appearance of multi focal bilateral remote cortical infarcts, atrophy, ventriculomegaly and sequela of longstanding microvascular ischemia.   Original Report Authenticated By: Malachy Moan, M.D.     Scheduled Meds: . ipratropium  0.5 mg Nebulization Q4H   And  . albuterol  2.5 mg Nebulization Q4H  . aspirin EC  81 mg Oral Daily  . budesonide-formoterol  2 puff Inhalation BID  . clopidogrel  75 mg Oral Daily  . famotidine  20 mg Oral BID  . furosemide  40 mg Oral Daily  . heparin  5,000 Units Subcutaneous Q8H  . metoprolol  2.5 mg Intravenous Q8H  . OLANZapine  2.5 mg Oral QHS  . pantoprazole  40 mg Oral Daily  . potassium chloride  10 mEq Oral Daily  . QUEtiapine  50 mg Oral Daily  . simvastatin  20 mg Oral QHS  . sodium chloride  500 mL Intravenous Once   Continuous Infusions: . sodium chloride 100 mL/hr at 06/24/12 0201    Principal Problem:   Acute ischemic stroke Active Problems:   HYPERTENSION, BENIGN ESSENTIAL   COPD    Time spent: 25 minutes.    [name]  [facility] Pager [phone]. If 7PM-7AM, please contact night-coverage at [email or web address], password [facility] 06/24/2012, 7:59 AM  LOS: 0 days

## 2012-06-24 NOTE — ED Notes (Signed)
Pt verbal responces noted to be significantly decreased.  Neurologist consulted and NS bolus ordered.  Dr Rulon Abide made aware

## 2012-06-24 NOTE — ED Notes (Signed)
Per EMS pt went to bed around 0001 and awoke found by daughter with trouble talking and weakness in the left arm.

## 2012-06-24 NOTE — Progress Notes (Signed)
Stroke Team Progress Note  HISTORY  [name] is a 67 y.o. female with a history of CHF with multiple previous strokes and dementia who presents with slurred speech that was present on awakening 20 minutes after going to bed(per EMS). The patient is unable to give me any clear history of the night's events, but does state that her slurred speech is new. She has dementia at baseline.  LKW: 12 am, though unclear as no independent source could be reached.  tpa given: no, mild (new)deficits, unclear time of onset  Patient was not a TPA candidate secondary to unknown time of onset.   SUBJECTIVE The patient is alert and cooperative. Speech is very dysarthric, difficult to understand. The patient will follow commands.  OBJECTIVE Most recent Vital Signs: Filed Vitals:   06/24/12 0445 06/24/12 0500 06/24/12 0621 06/24/12 0826  BP: 168/121 180/105 188/100 153/93  Pulse: 75 67 122 50  Temp:   97.4 F (36.3 C) 98.3 F (36.8 C)  TempSrc:   Oral Oral  Resp: 24 24 22 20   SpO2: 100% 100% 92% 100%   CBG (last 3)   Recent Labs  06/24/12 0059  GLUCAP 84    IV Fluid Intake:   . sodium chloride 100 mL/hr at 06/24/12 0201    MEDICATIONS  . ipratropium  0.5 mg Nebulization Q4H   And  . albuterol  2.5 mg Nebulization Q4H  . aspirin  300 mg Rectal Daily  . budesonide-formoterol  2 puff Inhalation BID  . ciprofloxacin  400 mg Intravenous Q12H  . clopidogrel  75 mg Oral Daily  . famotidine  20 mg Oral BID  . heparin  5,000 Units Subcutaneous Q8H  . metoprolol  2.5 mg Intravenous Q8H  . OLANZapine  2.5 mg Oral QHS  . pantoprazole  40 mg Oral Daily  . potassium chloride  10 mEq Oral Daily  . QUEtiapine  50 mg Oral Daily  . simvastatin  20 mg Oral QHS  . sodium chloride  500 mL Intravenous Once   PRN:  albuterol  Diet:  NPO  Activity:  Bedrest DVT Prophylaxis:  SQ heparin  CLINICALLY SIGNIFICANT STUDIES Basic Metabolic Panel:  Recent Labs Lab 06/24/12 0059 06/24/12 0133  NA  141 143  K 4.8 4.8  CL 102 104  CO2 31  --   GLUCOSE 75 75  BUN 16 17  CREATININE 0.92 1.00  CALCIUM 10.1  --    Liver Function Tests: No results found for this basename: AST, ALT, ALKPHOS, BILITOT, PROT, ALBUMIN,  in the last 168 hours CBC:  Recent Labs Lab 06/24/12 0059 06/24/12 0133  WBC 4.4  --   HGB 15.2* 16.3*  HCT 43.9 48.0*  MCV 89.8  --   PLT 211  --    Coagulation:  Recent Labs Lab 06/24/12 0059  LABPROT 12.8  INR 0.97   Cardiac Enzymes:  Recent Labs Lab 06/24/12 0059 06/24/12 0802  TROPONINI <0.30 <0.30   Urinalysis:  Recent Labs Lab 06/24/12 0704  COLORURINE YELLOW  LABSPEC 1.015  PHURINE 7.0  GLUCOSEU NEGATIVE  HGBUR NEGATIVE  BILIRUBINUR NEGATIVE  KETONESUR NEGATIVE  PROTEINUR NEGATIVE  UROBILINOGEN 0.2  NITRITE NEGATIVE  LEUKOCYTESUR SMALL*   Lipid Panel    Component Value Date/Time   CHOL 213* 10/07/2011 0650   TRIG 73 10/07/2011 0650   HDL 100 10/07/2011 0650   CHOLHDL 2.1 10/07/2011 0650   VLDL 15 10/07/2011 0650   LDLCALC 98 10/07/2011 0650   HgbA1C  Lab  Results  Component Value Date   HGBA1C 5.9* 10/07/2011    Urine Drug Screen:     Component Value Date/Time   LABOPIA NONE DETECTED 06/15/2011 1400   LABOPIA NEGATIVE 04/01/2009 1007   COCAINSCRNUR NONE DETECTED 06/15/2011 1400   COCAINSCRNUR  Value: POSITIVE (NOTE) Result repeated and verified. Sent for confirmatory testing* 04/01/2009 1007   LABBENZ NONE DETECTED 06/15/2011 1400   LABBENZ NEGATIVE 04/01/2009 1007   AMPHETMU NONE DETECTED 06/15/2011 1400   AMPHETMU NEGATIVE 04/01/2009 1007   THCU NONE DETECTED 06/15/2011 1400   LABBARB NONE DETECTED 06/15/2011 1400    Alcohol Level: No results found for this basename: ETH,  in the last 168 hours  Dg Chest 2 View  06/24/2012  *RADIOLOGY REPORT*  Clinical Data: Cough  CHEST - 2 VIEW  Comparison: 10/06/2011  Findings: The cardiomediastinal silhouette is stable.  Again noted hyperinflation and chronic mild interstitial prominence.  Stable  old left rib fractures.  No acute infiltrate or pulmonary edema.  IMPRESSION: Stable COPD.  No active disease.   Original Report Authenticated By: Natasha Mead, M.D.    Ct Head Wo Contrast  06/24/2012  *RADIOLOGY REPORT*  Clinical Data: Code stroke, dysarthria  CT HEAD WITHOUT CONTRAST  Technique:  Contiguous axial images were obtained from the base of the skull through the vertex without contrast.  Comparison: Prior head CT 10/09/2011  Findings: No acute intracranial hemorrhage, acute infarction, mass lesion, mass effect, midline shift or hydrocephalus.  Gray-white differentiation is preserved throughout.  Similar appearance of a multi focal cortical encephalomalacia consistent with prior infarctions in the right frontal lobe, right parietal lobe, left posterior frontal lobe, left occipital lobe.  Additionally, there is periventricular, subcortical white matter hypoattenuation consistent with the sequela of longstanding microvascular ischemia. Given the overall extent of underlying chronic infarction, a new acute or subacute focus of ischemia would be difficult to detect. Extensive calcification along the falx, stable.  Globes are intact and orbits unremarkable.  No focal soft tissue or calvarial abnormality.  Normal aeration of the bilateral mastoid air cells and paranasal sinuses.  IMPRESSION:  1.  No acute intracranial abnormality. 2.  Stable appearance of multi focal bilateral remote cortical infarcts, atrophy, ventriculomegaly and sequela of longstanding microvascular ischemia.   Original Report Authenticated By: Malachy Moan, M.D.     CT of the brain   IMPRESSION:  1. No acute intracranial abnormality. 2. Stable appearance of multi focal bilateral remote cortical infarcts, atrophy, ventriculomegaly and sequela of longstanding microvascular ischemia.  MRI of the brain    MRA of the brain    2D Echocardiogram    Carotid Doppler    CXR   IMPRESSION: Stable COPD. No active  disease.   EKG   Sinus bradycardia Left bundle branch block Abnormal ECG  Therapy Recommendations pending  Physical Exam  General: The patient is alert and cooperative at the time of the examination.  Skin: No significant peripheral edema is noted.   Neurologic Exam  Cranial nerves: Facial symmetry is present. Speech is very dysarthric, difficult to understand. The patient has a right homonymous visual field deficit.  Motor: The patient has good strength in all 4 extremities.  Coordination: The patient has good finger-nose-finger and heel-to-shin bilaterally.  Gait and station: The gait was not tested.  Reflexes: Deep tendon reflexes are symmetric.    ASSESSMENT [name] is a 67 y.o. female presenting with severe dysarthria. Stroke workup is underway. The patient was not a TPA candidate secondary to unknown  time of onset.   Polysubstance abuse, cocaine abuse  History of coronary artery disease, myocardial infarction  Schizophrenia  Multi-infarct state  COPD  Obstructive sleep apnea  History of breast cancer dyslipidemia   Congestive heart failure    Hospital day # 0  CT of the head shows extensive bihemispheric infarcts. 2-D echocardiogram done in the past shows a low ejection fraction of 10-15%. The patient is not felt to be a Coumadin candidate previously. The patient was treated with low-dose aspirin at that time. Stroke is felt to be cardioembolic. The patient was on aspirin and Plavix prior to coming in.  TREATMENT/PLAN  Continue aspirin and Plavix for now.   Carotid Doppler study  2-D echocardiogram  Physical, occupational, and speech therapy evaluation  N.p.o. for now  MRI the brain  MRA of the head  Follow clinical course   06/24/2012 11:06 AM  Lesly Dukes

## 2012-06-24 NOTE — Progress Notes (Signed)
PT Cancellation Note  Patient Details Name: [name] MRN: [id number] DOB: 1945-05-25   Cancelled Treatment:    Reason Eval/Treat Not Completed: Patient not medically ready (PT order written to start 06/25/12.)   [name] 06/24/2012, 12:57 PM Jake Shark, PT DPT [phone]

## 2012-06-24 NOTE — Progress Notes (Signed)
Agree with the clinical assessment.

## 2012-06-25 ENCOUNTER — Inpatient Hospital Stay (HOSPITAL_COMMUNITY): Payer: Medicare Other

## 2012-06-25 LAB — CBC
HCT: 38.9 % (ref 36.0–46.0)
Hemoglobin: 13.4 g/dL (ref 12.0–15.0)
MCH: 30.5 pg (ref 26.0–34.0)
MCV: 88.4 fL (ref 78.0–100.0)
RBC: 4.4 MIL/uL (ref 3.87–5.11)
WBC: 3.1 10*3/uL — ABNORMAL LOW (ref 4.0–10.5)

## 2012-06-25 LAB — URINE CULTURE

## 2012-06-25 LAB — LIPID PANEL
HDL: 90 mg/dL (ref >39)
LDL Cholesterol: 83 mg/dL (ref 0–99)
Total CHOL/HDL Ratio: 2 RATIO
Triglycerides: 55 mg/dL (ref <150)
VLDL: 11 mg/dL (ref 0–40)

## 2012-06-25 LAB — BASIC METABOLIC PANEL
BUN: 8 mg/dL (ref 6–23)
CO2: 23 mEq/L (ref 19–32)
Calcium: 9 mg/dL (ref 8.4–10.5)
Chloride: 107 mEq/L (ref 96–112)
Creatinine, Ser: 0.85 mg/dL (ref 0.50–1.10)
Glucose, Bld: 83 mg/dL (ref 70–99)

## 2012-06-25 LAB — HEMOGLOBIN A1C: Hgb A1c MFr Bld: 6.1 % — ABNORMAL HIGH (ref <5.7)

## 2012-06-25 MED ORDER — LORAZEPAM 2 MG/ML IJ SOLN
1.0000 mg | Freq: Once | INTRAMUSCULAR | Status: AC
  Administered 2012-06-25: 1 mg via INTRAVENOUS
  Filled 2012-06-25: qty 1

## 2012-06-25 MED ORDER — TIOTROPIUM BROMIDE MONOHYDRATE 18 MCG IN CAPS
18.0000 ug | ORAL_CAPSULE | Freq: Every day | RESPIRATORY_TRACT | Status: DC
  Administered 2012-06-27: 18 ug via RESPIRATORY_TRACT
  Filled 2012-06-25: qty 5

## 2012-06-25 MED ORDER — ENSURE COMPLETE PO LIQD
237.0000 mL | Freq: Two times a day (BID) | ORAL | Status: DC
  Administered 2012-06-25 – 2012-06-27 (×4): 237 mL via ORAL

## 2012-06-25 MED ORDER — SODIUM CHLORIDE 0.9 % IV SOLN
INTRAVENOUS | Status: DC
  Administered 2012-06-25: 15:00:00 via INTRAVENOUS

## 2012-06-25 MED ORDER — CARVEDILOL 3.125 MG PO TABS
3.1250 mg | ORAL_TABLET | Freq: Two times a day (BID) | ORAL | Status: DC
  Administered 2012-06-25: 3.125 mg via ORAL
  Filled 2012-06-25 (×4): qty 1

## 2012-06-25 NOTE — Progress Notes (Signed)
Occupational Therapy Evaluation Patient Details Name: [name] MRN: [id number] DOB: 02/09/1946 Today's Date: 06/25/2012 Time: 0865-7846 OT Time Calculation (min): 34 min  OT Assessment / Plan / Recommendation Clinical Impression  67 yo s/p multiple CVAs. New small R frontal CVA with increased c/o slurred speech. Pt familiar to this therapist. Pt functioning at current baseline regarding mobility and ADL.Marland Kitchen Pt will need 24/7 S from family to return home. No further OT needs at this time.    OT Assessment  Patient does not need any further OT services    Follow Up Recommendations  No OT follow up    Barriers to Discharge      Equipment Recommendations   none    Recommendations for Other Services    Frequency    eval only   Precautions / Restrictions Precautions Precautions: Fall Precaution Comments: cognitive deficits   Pertinent Vitals/Pain no apparent distress     ADL  Eating/Feeding: Supervision/safety Where Assessed - Eating/Feeding: Chair Grooming: Supervision/safety;Set up Where Assessed - Grooming: Unsupported standing Upper Body Bathing: Supervision/safety;Set up Where Assessed - Upper Body Bathing: Unsupported sit to stand Lower Body Bathing: Supervision/safety;Set up Where Assessed - Lower Body Bathing: Unsupported sit to stand Upper Body Dressing: Supervision/safety;Set up Where Assessed - Upper Body Dressing: Unsupported sitting Lower Body Dressing: Supervision/safety;Set up Where Assessed - Lower Body Dressing: Unsupported sit to stand Toilet Transfer: Supervision/safety Toilet Transfer Method: Sit to Barista: Comfort height toilet Toileting - Clothing Manipulation and Hygiene: Modified independent Where Assessed - Toileting Clothing Manipulation and Hygiene: Sit to stand from 3-in-1 or toilet Equipment Used: Gait belt;Rolling walker Transfers/Ambulation Related to ADLs: S sit - stand. ADL Comments: At current baseline     OT Diagnosis:    OT Problem List:   OT Treatment Interventions:     OT Goals Acute Rehab OT Goals OT Goal Formulation: With patient (eval only)  Visit Information  Last OT Received On: 06/25/12 Assistance Needed: +1    Subjective Data      Prior Functioning     Home Living Lives With: Family (sisters, niece) Available Help at Discharge: Family;Available 24 hours/day Type of Home: Apartment Home Access: Stairs to enter Entrance Stairs-Number of Steps: 1 Home Layout: One level Bathroom Shower/Tub: Engineer, manufacturing systems: Standard Home Adaptive Equipment: Bedside commode/3-in-1;Walker - rolling;Straight cane;Shower chair with back Prior Function Level of Independence: Independent with assistive device(s) Able to Take Stairs?: Yes Driving: No Comments: walks inside with RW independently; outside with supervision Communication Communication: Expressive difficulties Dominant Hand: Right         Vision/Perception Vision - History Baseline Vision: No visual deficits   Cognition  Cognition Arousal/Alertness: Awake/alert Behavior During Therapy: WFL for tasks assessed/performed Overall Cognitive Status: No family/caregiver present to determine baseline cognitive functioning (perseverating)    Extremity/Trunk Assessment Right Upper Extremity Assessment RUE ROM/Strength/Tone: Within functional levels RUE Sensation: WFL - Light Touch;WFL - Proprioception RUE Coordination: WFL - gross/fine motor Left Upper Extremity Assessment LUE ROM/Strength/Tone: Deficits LUE ROM/Strength/Tone Deficits: residual deficits from remote infarcts. dystonia Right Lower Extremity Assessment RLE ROM/Strength/Tone: WFL for tasks assessed Left Lower Extremity Assessment LLE ROM/Strength/Tone: Deficits Trunk Assessment Trunk Assessment: Kyphotic     Mobility Bed Mobility Bed Mobility: Supine to Sit Supine to Sit: 5: Supervision Transfers Transfers: Sit to Stand;Stand to  Sit Sit to Stand: 5: Supervision;With upper extremity assist;From chair/3-in-1 Stand to Sit: 5: Supervision;With upper extremity assist;To chair/3-in-1     Exercise     Balance Balance Balance  Assessed: Yes (S during dynamic functional tasks)   End of Session OT - End of Session Equipment Utilized During Treatment: Gait belt Activity Tolerance: Patient tolerated treatment well Patient left: in chair;with call bell/phone within reach;with nursing in room Nurse Communication: Mobility status  GO     Chelan Heringer,HILLARY 06/25/2012, 4:46 PM [facility], OTR/L  [phone] 06/25/2012

## 2012-06-25 NOTE — Progress Notes (Signed)
INITIAL NUTRITION ASSESSMENT  Pt meets criteria for SEVERE MALNUTRITION in the context of chronic illness as evidenced by severe fat and muscle wasting.  DOCUMENTATION CODES Per approved criteria  -Severe malnutrition in the context of chronic illness -Underweight   INTERVENTION: Ensure Complete po BID, each supplement provides 350 kcal and 13 grams of protein.  NUTRITION DIAGNOSIS: Malnutrition related to chronic illness as evidenced by severe fat and muscle wasting.   Goal: Pt to meet >/= 90% of their estimated nutrition needs.   Monitor:  PO intake, weight trend, supplement acceptance  Reason for Assessment: Pt identified as at nutrition risk on the Malnutrition Screen Tool  67 y.o. female  Admitting Dx: Acute ischemic stroke  ASSESSMENT: Pt admitted with new acute stroke. Pt with dysarthria. Per pt she loses weight easily. Pt unsure of weight loss. Pt reports good appetite.  Pt with 10% weight loss x 8 months.   Nutrition Focused Physical Exam:  Subcutaneous Fat:  Orbital Region: mild-moderate wasting Upper Arm Region: severe wasting Thoracic and Lumbar Region: mild-moderate wasting  Muscle:  Temple Region: severe wasting Clavicle Bone Region: severe wasting  Clavicle and Acromion Bone Region: severe wasting Scapular Bone Region: severe wasting Dorsal Hand: NA Patellar Region: severe wasting Anterior Thigh Region: mild-moderate wasting Posterior Calf Region: mild-moderate wasting  Edema: not present   Height: Ht Readings from Last 1 Encounters:  06/24/12 5\' 1"  (1.549 m)    Weight: Wt Readings from Last 1 Encounters:  06/24/12 87 lb 6.4 oz (39.644 kg)    Ideal Body Weight: 47.7 kg  % Ideal Body Weight: 83%  Wt Readings from Last 10 Encounters:  06/24/12 87 lb 6.4 oz (39.644 kg)  10/19/11 97 lb 7.1 oz (44.2 kg)  10/10/11 97 lb 3.6 oz (44.1 kg)  04/13/11 100 lb 8.5 oz (45.6 kg)  04/06/11 102 lb (46.267 kg)  03/01/11 101 lb 14.4 oz (46.222 kg)   02/28/11 104 lb 11.2 oz (47.492 kg)  02/19/11 100 lb 8.5 oz (45.6 kg)  02/10/11 99 lb 9.6 oz (45.178 kg)  04/29/10 100 lb 6.4 oz (45.541 kg)    Usual Body Weight: 97 lb 8/13  % Usual Body Weight: 90%  BMI:  Body mass index is 16.52 kg/(m^2). Underweight  Estimated Nutritional Needs: Kcal: 1400-1500 Protein: 65-75 grams Fluid: > 1.5 L/day  Skin: no issues noted  Diet Order: Cardiac  EDUCATION NEEDS: -No education needs identified at this time  No intake or output data in the 24 hours ending 06/25/12 1445  Last BM: PTA   Labs:   Recent Labs Lab 06/24/12 0059 06/24/12 0133 06/25/12 0517  NA 141 143 140  K 4.8 4.8 3.8  CL 102 104 107  CO2 31  --  23  BUN 16 17 8   CREATININE 0.92 1.00 0.85  CALCIUM 10.1  --  9.0  GLUCOSE 75 75 83    CBG (last 3)   Recent Labs  06/24/12 0059  GLUCAP 84    Scheduled Meds: . antiseptic oral rinse  15 mL Mouth Rinse q12n4p  . budesonide-formoterol  2 puff Inhalation BID  . carvedilol  3.125 mg Oral BID WC  . chlorhexidine  15 mL Mouth Rinse BID  . ciprofloxacin  400 mg Intravenous Q12H  . clopidogrel  75 mg Oral Daily  . famotidine  20 mg Oral BID  . heparin  5,000 Units Subcutaneous Q8H  . OLANZapine  2.5 mg Oral QHS  . pantoprazole  40 mg Oral Daily  . potassium  chloride  10 mEq Oral Daily  . QUEtiapine  50 mg Oral Daily  . simvastatin  20 mg Oral QHS  . tiotropium  18 mcg Inhalation Daily    Continuous Infusions: . sodium chloride 100 mL/hr at 06/25/12 1610    Past Medical History  Diagnosis Date  . Weight loss, unintentional   . Trouble swallowing   . Change in voice   . Substance abuse   . Dementia   . Asthma   . Shortness of breath   . Seizures     "    It has been along time "  . CHF (congestive heart failure)   . Headache   . Hypertension   . Arthralgia   . Tremors of nervous system   . Anxiety   . Rib fractures     hx of May 2012  . Heart attack 05/21/10  . Schizophrenia   . Coronary  artery disease   . Stroke 07/11/10    hx of R CVA   . COPD (chronic obstructive pulmonary disease)   . Obstructive sleep apnea   . 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 Total abdominal hysterectomy  age 17 [address] [place], [place], [place] [phone] Pager [phone] After Hours Pager

## 2012-06-25 NOTE — Progress Notes (Signed)
[facility] PROGRESS NOTE  [name] WUJ:[id number] DOB: September 06, 1945 DOA: 06/24/2012 PCP: Robynn Pane, MD  Assessment/Plan: Acute ischemic stroke: patient presents with new onset dysarthria.  MRI show small acute mid right frontal lobe infarct, MRA pending. ECHO pending. Work up for infection recommended by neuro in process: Urine culture pending, blood culture pending.  Chest x ray no active lung diseases. HB-A1c 6.1, LDL 83. Carotid doppler: no ICA stenosis. Vertebral artery flow is antegrade -Continue with aspirin and plavix. -Neuro following. -Pt, OT evaluation pending.  -Patient pass swallow evaluation.   COPD - schedule Nebulizer treatments.  Elevated troponin - troponin of 0.13. Patient has had elevated point of care marker increase in the pass. cycle cardia enzymes negative times 3. ECHO pending.   HTN - Resume coreg.  Tachycardia: resolved, now bradycardia.  Pyuria: Continue with  Ciprofloxacin day 2. Fu urine culture.     Code Status: full Family Communication: none at bedside.  Disposition Plan: to be determine.    Consultants:  Neuro  Procedures:  none  Antibiotics:  none  HPI/Subjective: Some improvement with speech. She lives with her sister.   Objective: Filed Vitals:   06/25/12 0126 06/25/12 0507 06/25/12 1000 06/25/12 1134  BP: 155/98 139/92 103/85 138/80  Pulse: 56 62 52   Temp: 97.8 F (36.6 C) 97.7 F (36.5 C) 97.8 F (36.6 C)   TempSrc: Oral Oral Oral   Resp: 16 16 17 18   Height:      Weight:      SpO2: 100% 100% 98% 98%   No intake or output data in the 24 hours ending 06/25/12 1335 Filed Weights   06/24/12 2100  Weight: 39.644 kg (87 lb 6.4 oz)    Exam:   General:  No distress.   Cardiovascular: S 1, S 2 RRR  Respiratory: few wheezes, no crackles.   Abdomen: BS present, soft, NT  Musculoskeletal: no edema.   Neuro aphasia, dysarthria. Motor strength  5/5 right. Left side 4-5   Data Reviewed: Basic  Metabolic Panel:  Recent Labs Lab 06/24/12 0059 06/24/12 0133 06/25/12 0517  NA 141 143 140  K 4.8 4.8 3.8  CL 102 104 107  CO2 31  --  23  GLUCOSE 75 75 83  BUN 16 17 8   CREATININE 0.92 1.00 0.85  CALCIUM 10.1  --  9.0   Liver Function Tests: No results found for this basename: AST, ALT, ALKPHOS, BILITOT, PROT, ALBUMIN,  in the last 168 hours No results found for this basename: LIPASE, AMYLASE,  in the last 168 hours No results found for this basename: AMMONIA,  in the last 168 hours CBC:  Recent Labs Lab 06/24/12 0059 06/24/12 0133 06/25/12 0517  WBC 4.4  --  3.1*  HGB 15.2* 16.3* 13.4  HCT 43.9 48.0* 38.9  MCV 89.8  --  88.4  PLT 211  --  194   Cardiac Enzymes:  Recent Labs Lab 06/24/12 0059 06/24/12 0802 06/24/12 1353 06/24/12 2015  TROPONINI <0.30 <0.30 <0.30 <0.30   BNP (last 3 results) No results found for this basename: PROBNP,  in the last 8760 hours CBG:  Recent Labs Lab 06/24/12 0059  GLUCAP 84    Recent Results (from the past 240 hour(s))  CULTURE, BLOOD (ROUTINE X 2)     Status: None   Collection Time    06/24/12  7:49 AM      Result Value Range Status   Specimen Description BLOOD RIGHT ANTECUBITAL   Final  Special Requests BOTTLES DRAWN AEROBIC ONLY 10CC   Final   Culture  Setup Time 06/24/2012 15:21   Final   Culture     Final   Value:        BLOOD CULTURE RECEIVED NO GROWTH TO DATE CULTURE WILL BE HELD FOR 5 DAYS BEFORE ISSUING A FINAL NEGATIVE REPORT   Report Status PENDING   Incomplete  CULTURE, BLOOD (ROUTINE X 2)     Status: None   Collection Time    06/24/12  8:03 AM      Result Value Range Status   Specimen Description BLOOD HAND LEFT   Final   Special Requests BOTTLES DRAWN AEROBIC ONLY 1CC   Final   Culture  Setup Time 06/24/2012 15:21   Final   Culture     Final   Value:        BLOOD CULTURE RECEIVED NO GROWTH TO DATE CULTURE WILL BE HELD FOR 5 DAYS BEFORE ISSUING A FINAL NEGATIVE REPORT   Report Status PENDING    Incomplete     Studies: Dg Chest 2 View  06/24/2012  *RADIOLOGY REPORT*  Clinical Data: Cough  CHEST - 2 VIEW  Comparison: 10/06/2011  Findings: The cardiomediastinal silhouette is stable.  Again noted hyperinflation and chronic mild interstitial prominence.  Stable old left rib fractures.  No acute infiltrate or pulmonary edema.  IMPRESSION: Stable COPD.  No active disease.   Original Report Authenticated By: Natasha Mead, M.D.    Ct Head Wo Contrast  06/24/2012  *RADIOLOGY REPORT*  Clinical Data: Code stroke, dysarthria  CT HEAD WITHOUT CONTRAST  Technique:  Contiguous axial images were obtained from the base of the skull through the vertex without contrast.  Comparison: Prior head CT 10/09/2011  Findings: No acute intracranial hemorrhage, acute infarction, mass lesion, mass effect, midline shift or hydrocephalus.  Gray-white differentiation is preserved throughout.  Similar appearance of a multi focal cortical encephalomalacia consistent with prior infarctions in the right frontal lobe, right parietal lobe, left posterior frontal lobe, left occipital lobe.  Additionally, there is periventricular, subcortical white matter hypoattenuation consistent with the sequela of longstanding microvascular ischemia. Given the overall extent of underlying chronic infarction, a new acute or subacute focus of ischemia would be difficult to detect. Extensive calcification along the falx, stable.  Globes are intact and orbits unremarkable.  No focal soft tissue or calvarial abnormality.  Normal aeration of the bilateral mastoid air cells and paranasal sinuses.  IMPRESSION:  1.  No acute intracranial abnormality. 2.  Stable appearance of multi focal bilateral remote cortical infarcts, atrophy, ventriculomegaly and sequela of longstanding microvascular ischemia.   Original Report Authenticated By: Malachy Moan, M.D.    Mr Brain Wo Contrast  06/24/2012  *RADIOLOGY REPORT*  Clinical Data: Slurred speech.  MRI HEAD WITHOUT  CONTRAST  Technique:  Multiplanar, multiecho pulse sequences of the brain and surrounding structures were obtained according to standard protocol without intravenous contrast.  Comparison: 06/24/2012 CT.  10/07/2011 MR.  Findings: Multiple remote hemispheric infarcts with encephalomalacia involving portions of the; medial right frontal lobe, medial right parietal lobe, lateral posterior right temporal lobe, right occipital lobe, medial left temporal lobe, left occipital lobe, left frontal opercular region and right cerebellum. Blood breakdown products noted involving the posterior right temporal - parietal lobe remote infarct.  Tiny acute mid right frontal lobe infarct.  Prominent ossification of the falx.  No intracranial mass lesion detected on this unenhanced exam.  Global atrophy.  Ventricular prominence felt to be related to  atrophy and result of prior infarcts.  Major intracranial vascular structures are patent.  Limited evaluation of branch vessels.  IMPRESSION: Tiny acute mid right frontal lobe infarct.  Multiple remote infarcts as detailed above.   Original Report Authenticated By: Lacy Duverney, M.D.     Scheduled Meds: . antiseptic oral rinse  15 mL Mouth Rinse q12n4p  . budesonide-formoterol  2 puff Inhalation BID  . chlorhexidine  15 mL Mouth Rinse BID  . ciprofloxacin  400 mg Intravenous Q12H  . clopidogrel  75 mg Oral Daily  . famotidine  20 mg Oral BID  . heparin  5,000 Units Subcutaneous Q8H  . metoprolol  2.5 mg Intravenous Q12H  . OLANZapine  2.5 mg Oral QHS  . pantoprazole  40 mg Oral Daily  . potassium chloride  10 mEq Oral Daily  . QUEtiapine  50 mg Oral Daily  . simvastatin  20 mg Oral QHS   Continuous Infusions: . sodium chloride 100 mL/hr at 06/25/12 4540    Principal Problem:   Acute ischemic stroke Active Problems:   HYPERTENSION, BENIGN ESSENTIAL   COPD    Time spent: 25 minutes.    Meloney Feld  [facility] Pager [phone]. If 7PM-7AM, please  contact night-coverage at [email or web address], password [facility] 06/25/2012, 1:35 PM  LOS: 1 day

## 2012-06-25 NOTE — Progress Notes (Signed)
  Echocardiogram 2D Echocardiogram has been performed.  [name] 06/25/2012, 10:39 AM

## 2012-06-25 NOTE — Progress Notes (Signed)
OT Cancellation Note  Patient Details Name: [name] MRN: [id number] DOB: May 18, 1945   Cancelled Treatment:    Reason Eval/Treat Not Completed: Patient not medically ready. Pt remains on bedrest. Will await  activity orders for OT eval.      Dow Adolph 06/25/2012, 10:57 AM

## 2012-06-25 NOTE — Clinical Documentation Improvement (Signed)
DOCUMENTATION CLARIFICATION  THIS DOCUMENT IS NOT A PERMANENT PART OF THE MEDICAL RECORD  Please update your documentation within the medical record to reflect your response to this query.                                                                                        06/25/12   Dear [name],  In a better effort to capture your patient's severity of illness, reflect appropriate length of stay and utilization of resources, a review of the patient medical record has revealed the following indicators.   Based on your clinical judgment, please clarify and document in a progress note and/or discharge summary the clinical condition associated with the following supporting information: In responding to this query please exercise your independent judgment.  The fact that a query is asked, does not imply that any particular answer is desired or expected.   "severe malnutrition" and "underweight" was documented by the nutritionist on 5/5. If your clinical findings/judgment agrees with their diagnosis, could you please document the related diagnosis in the progress note(s) and discharge summary. THANK YOU!    [facility]: A diagnosis of UNDERWEIGHT or MORBID OBESITY should have the BMI documented along with it.   Possible Clinical Conditions?  - Severe Malnutrition and Underweight  - Severe Malnutrition    - Underweight  - Other condition (please document in the progress notes and/or discharge summary)  - Cannot Clinically determine at this time   Additional Supporting Information:  Estimated body mass index is 16.52 kg/(m^2) as calculated from the following:   Height as of this encounter: 5\' 1"  (1.549 m).   Weight as of this encounter: 87 lb 6.4 oz (39.644 kg).    No additional documentation in chart upon review. SM   Thank You,  Saul Fordyce  Clinical Documentation Specialist: [phone] Pager  Health Information Management [place]

## 2012-06-25 NOTE — Evaluation (Signed)
Speech Language Pathology Evaluation Patient Details Name: [name] MRN: [id number] DOB: 1945/12/26 Today's Date: 06/25/2012 Time: 7829-5621 SLP Time Calculation (min): 10 min  Problem List:  Patient Active Problem List   Diagnosis Date Noted  . Acute ischemic stroke 06/24/2012  . CVA (cerebral infarction) 10/11/2011  . Chest pain at rest 04/08/2011    Class: Acute  . Hypertension   . Arthralgia   . Breast cancer, stage 2 02/10/2011  . Trouble swallowing   . Breast cancer 01/28/2011  . CHF 12/25/2009  . Cerebral embolism with cerebral infarction 05/26/2009  . 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 LIVER FUNCTION TESTS, ABNORMAL, HX OF 05/14/2009  . CONSTIPATION 04/13/2009  . HYPERLIPIDEMIA 04/06/2009  . SCHIZOPHRENIA 04/06/2009  . ANXIETY 04/06/2009  . COCAINE ABUSE 04/06/2009  . HYPERTENSION, BENIGN ESSENTIAL 04/06/2009  . MYOCARDIAL INFARCTION 04/06/2009  . ASTHMA 04/06/2009  . COPD 04/06/2009  . CHOLELITHIASIS 04/06/2009  . RENAL FAILURE, ACUTE 04/06/2009  . CAD 03/31/2009   Past Medical History:  Past Medical History  Diagnosis Date  . Weight loss, unintentional   . Trouble swallowing   . Change in voice   . Substance abuse   . Dementia   . Asthma   . Shortness of breath   . Seizures     "    It has been along time "  . CHF (congestive heart failure)   . Headache   . Hypertension   . Arthralgia   . Tremors of nervous system   . Anxiety   . Rib fractures     hx of May 2012  . Heart attack 05/21/10  . Schizophrenia   . Coronary artery disease   . Stroke 07/11/10    hx of R CVA   . COPD (chronic obstructive pulmonary disease)   . Obstructive sleep apnea   . Breast cancer 01/28/11    L breast, inv ductal/in situ, ER/PR -, Her2 -   Past Surgical History:  Past Surgical History  Procedure Laterality Date  . Total abdominal hysterectomy  age 51   HPI:  67 y.o. female with a history of CHF with multiple previous strokes and dementia  presented with dysarthric speech.  MRI: Tiny acute mid right frontal lobe infarct; remote multiple bilateral hemispheric infarcts.  Pt followed by SLP services for cognitive deficits during August 2013 admission to CIR.  She was not dysphagic after 8/14 CVA.     Assessment / Plan / Recommendation Clinical Impression  Pt presents with baseline cognitive deficits s/p CVAs; new severe dysarthria with poor intelligibility.  Pt will benefit from SLP intervention to address basic communication for safety and making needs known.    SLP Assessment  Patient needs continued Speech Language Pathology Services    Follow Up Recommendations  Other (comment) (tba)    Frequency and Duration min 2x/week  1 week   Pertinent Vitals/Pain No pain   SLP Goals  SLP Goals Potential to Achieve Goals: Fair Progress/Goals/Alternative treatment plan discussed with pt/caregiver and they: Agree SLP Goal #1: Pt will utilize augmentative communication strategies to assist with functional communication with mod assist SLP Goal #2: Pt will use compensatory strategies to enhance speech clarity with mod assist.  SLP Evaluation Prior Functioning  Cognitive/Linguistic Baseline: Baseline deficits Baseline deficit details: memory, problem-solving Lives With: Family;Other (Comment) (sister)   Cognition  Overall Cognitive Status: No family/caregiver present to determine baseline cognitive functioning Orientation Level: Disoriented to place;Disoriented to time;Disoriented to situation Attention: Selective Selective Attention:  Impaired Selective Attention Impairment: Verbal basic;Functional basic Memory: Impaired Memory Impairment: Retrieval deficit;Decreased recall of new information;Decreased short term memory Decreased Short Term Memory: Verbal basic;Verbal complex Awareness: Impaired Awareness Impairment: Emergent impairment Problem Solving: Impaired Problem Solving Impairment: Verbal basic;Functional  basic Safety/Judgment: Impaired    Comprehension  Auditory Comprehension Overall Auditory Comprehension: Appears within functional limits for tasks assessed Visual Recognition/Discrimination Discrimination: Not tested Reading Comprehension Reading Status: Not tested    Expression Expression Primary Mode of Expression: Verbal Verbal Expression Overall Verbal Expression: Appears within functional limits for tasks assessed Written Expression Written Expression: Not tested   Oral / Motor Oral Motor/Sensory Function Overall Oral Motor/Sensory Function: Other (comment) (mild asymm CN VII on right) Motor Speech Overall Motor Speech: Impaired Phonation: Normal Articulation: Impaired Intelligibility: Intelligibility reduced Phrase: 25-49% accurate Sentence: 0-24% accurate Conversation: 0-24% accurate Motor Planning: Witnin functional limits   GO    Sereen Schaff L. Samson Frederic, [place] CCC/SLP Pager [phone]  Blenda Mounts Laurice 06/25/2012, 1:17 PM

## 2012-06-25 NOTE — Evaluation (Signed)
Clinical/Bedside Swallow Evaluation Patient Details  Name: [name] MRN: [id number] Date of Birth: [birth year]  Today's Date: 06/25/2012 Time: 1225-1235 SLP Time Calculation (min): 10 min  Past Medical History:  Past Medical History  Diagnosis Date  . Weight loss, unintentional   . Trouble swallowing   . Change in voice   . Substance abuse   . Dementia   . Asthma   . Shortness of breath   . Seizures     "    It has been along time "  . CHF (congestive heart failure)   . Headache   . Hypertension   . Arthralgia   . Tremors of nervous system   . Anxiety   . Rib fractures     hx of May 2012  . Heart attack 05/21/10  . Schizophrenia   . Coronary artery disease   . Stroke 07/11/10    hx of R CVA   . COPD (chronic obstructive pulmonary disease)   . Obstructive sleep apnea   . Breast cancer 01/28/11    L breast, inv ductal/in situ, ER/PR -, Her2 -   Past Surgical History:  Past Surgical History  Procedure Laterality Date  . Total abdominal hysterectomy  age 84   HPI:  67 y.o. female with a history of CHF with multiple previous strokes and dementia presented with dysarthric speech.  MRI: Tiny acute mid right frontal lobe infarct; remote multiple bilateral hemispheric infarcts.  Pt followed by SLP services for cognitive deficits during August 2013 admission to CIR.  She was not dysphagic after 8/14 CVA.     Assessment / Plan / Recommendation Clinical Impression  Pt presents with functional oropharyngeal swallow with adequate mastication, swift swallow trigger, and no signs of airway compromise.  Recommend allowing regular consistency diet with thin liquids.  No SLP f/u for swallowing.    Aspiration Risk  Mild    Diet Recommendation Regular;Thin liquid   Liquid Administration via: Cup;Straw Medication Administration: Whole meds with liquid Supervision: Patient able to self feed             Pertinent Vitals/Pain No pain    SLP Swallow Goals     Swallow  Study Prior Functional Status       General Date of Onset: 06/24/12 HPI: 67 y.o. female with a history of CHF with multiple previous strokes and dementia presented with dysarthric speech.  MRI: Tiny acute mid right frontal lobe infarct; remote multiple bilateral hemispheric infarcts.  Pt followed by SLP services for cognitive deficits during August 2013 admission to CIR.  She was not dysphagic after 8/14 CVA.   Type of Study: Bedside swallow evaluation Diet Prior to this Study: NPO Temperature Spikes Noted: No History of Recent Intubation: No Behavior/Cognition: Alert;Confused Oral Cavity - Dentition: Dentures, top;Dentures, bottom Self-Feeding Abilities: Able to feed self Patient Positioning: Partially reclined Baseline Vocal Quality: Clear Volitional Cough: Strong Volitional Swallow: Able to elicit    Oral/Motor/Sensory Function Overall Oral Motor/Sensory Function: Other (comment) (mild asymm CN VII on right)   Ice Chips Ice chips: Within functional limits Presentation: Spoon   Thin Liquid Thin Liquid: Within functional limits Presentation: Cup;Self Fed    Nectar Thick Nectar Thick Liquid: Not tested   Honey Thick Honey Thick Liquid: Not tested   Puree Puree: Within functional limits Presentation: Self Fed   Solid   GO    Solid: Within functional limits Presentation: Self Fed      [name] L. Samson Frederic, [place] CCC/SLP Pager [phone]  Blenda Mounts Laurice 06/25/2012,1:10 PM

## 2012-06-25 NOTE — Progress Notes (Signed)
PT Cancellation Note  Patient Details Name: [name] MRN: [id number] DOB: 10-31-1945   Cancelled Treatment:    Reason Eval Not Completed: Pt remains on bedrest. Will await incr activity orders prior to completing PT eval.   Kima Malenfant 06/25/2012, 10:20 AM Pager [phone]

## 2012-06-25 NOTE — Progress Notes (Signed)
Stroke Team Progress Note  HISTORY [name] is a 67 y.o. female with a history of CHF with multiple previous strokes and dementia who presents with slurred speech that was present on awakening 20 minutes after going to bed(per EMS). The patient is unable to give me any clear history of the night's events, but does state that her slurred speech is new. She has dementia at baseline.  LKW: 12 am, though unclear as no independent source could be reached.   Patient was not a TPA candidate secondary to unknown time of onset and mild deficits  SUBJECTIVE No complaints  OBJECTIVE Most recent Vital Signs: Filed Vitals:   06/25/12 0126 06/25/12 0507 06/25/12 1000 06/25/12 1134  BP: 155/98 139/92 103/85 138/80  Pulse: 56 62 52   Temp: 97.8 F (36.6 C) 97.7 F (36.5 C) 97.8 F (36.6 C)   TempSrc: Oral Oral Oral   Resp: 16 16 17 18   Height:      Weight:      SpO2: 100% 100% 98% 98%   CBG (last 3)   Recent Labs  06/24/12 0059  GLUCAP 84    IV Fluid Intake:   . sodium chloride 100 mL/hr at 06/25/12 0553    MEDICATIONS  . antiseptic oral rinse  15 mL Mouth Rinse q12n4p  . aspirin  300 mg Rectal Daily  . budesonide-formoterol  2 puff Inhalation BID  . chlorhexidine  15 mL Mouth Rinse BID  . ciprofloxacin  400 mg Intravenous Q12H  . clopidogrel  75 mg Oral Daily  . famotidine  20 mg Oral BID  . heparin  5,000 Units Subcutaneous Q8H  . metoprolol  2.5 mg Intravenous Q12H  . OLANZapine  2.5 mg Oral QHS  . pantoprazole  40 mg Oral Daily  . potassium chloride  10 mEq Oral Daily  . QUEtiapine  50 mg Oral Daily  . simvastatin  20 mg Oral QHS   PRN:  albuterol  Diet:  NPO  Activity:  Bedrest DVT Prophylaxis:  SQ heparin  CLINICALLY SIGNIFICANT STUDIES Basic Metabolic Panel:   Recent Labs Lab 06/24/12 0059 06/24/12 0133 06/25/12 0517  NA 141 143 140  K 4.8 4.8 3.8  CL 102 104 107  CO2 31  --  23  GLUCOSE 75 75 83  BUN 16 17 8   CREATININE 0.92 1.00 0.85  CALCIUM  10.1  --  9.0   Liver Function Tests: No results found for this basename: AST, ALT, ALKPHOS, BILITOT, PROT, ALBUMIN,  in the last 168 hours CBC:   Recent Labs Lab 06/24/12 0059 06/24/12 0133 06/25/12 0517  WBC 4.4  --  3.1*  HGB 15.2* 16.3* 13.4  HCT 43.9 48.0* 38.9  MCV 89.8  --  88.4  PLT 211  --  194   Coagulation:   Recent Labs Lab 06/24/12 0059  LABPROT 12.8  INR 0.97   Cardiac Enzymes:   Recent Labs Lab 06/24/12 0802 06/24/12 1353 06/24/12 2015  TROPONINI <0.30 <0.30 <0.30   Urinalysis:   Recent Labs Lab 06/24/12 0704  COLORURINE YELLOW  LABSPEC 1.015  PHURINE 7.0  GLUCOSEU NEGATIVE  HGBUR NEGATIVE  BILIRUBINUR NEGATIVE  KETONESUR NEGATIVE  PROTEINUR NEGATIVE  UROBILINOGEN 0.2  NITRITE NEGATIVE  LEUKOCYTESUR SMALL*   Lipid Panel    Component Value Date/Time   CHOL 184 06/25/2012 0500   TRIG 55 06/25/2012 0500   HDL 90 06/25/2012 0500   CHOLHDL 2.0 06/25/2012 0500   VLDL 11 06/25/2012 0500   LDLCALC  83 06/25/2012 0500   HgbA1C  Lab Results  Component Value Date   HGBA1C 6.1* 06/25/2012    Urine Drug Screen:     Component Value Date/Time   LABOPIA NONE DETECTED 06/15/2011 1400   LABOPIA NEGATIVE 04/01/2009 1007   COCAINSCRNUR NONE DETECTED 06/15/2011 1400   COCAINSCRNUR  Value: POSITIVE (NOTE) Result repeated and verified. Sent for confirmatory testing* 04/01/2009 1007   LABBENZ NONE DETECTED 06/15/2011 1400   LABBENZ NEGATIVE 04/01/2009 1007   AMPHETMU NONE DETECTED 06/15/2011 1400   AMPHETMU NEGATIVE 04/01/2009 1007   THCU NONE DETECTED 06/15/2011 1400   LABBARB NONE DETECTED 06/15/2011 1400    Alcohol Level: No results found for this basename: ETH,  in the last 168 hours  CT of the brain  06/24/2012 1.  No acute intracranial abnormality. 2.  Stable appearance of multi focal bilateral remote cortical infarcts, atrophy, ventriculomegaly and sequela of longstanding microvascular ischemia.  MRI of the brain  06/24/2012  Tiny acute mid right frontal lobe infarct.   Multiple remote infarcts  MRA of the brain    2D Echocardiogram    Carotid Doppler  No evidence of hemodynamically significant internal carotid artery stenosis. Vertebral artery flow is antegrade.   CXR  06/24/2012 Stable COPD. No active disease.  EKG  Sinus bradycardia, Left bundle branch block, Abnormal ECG  Therapy Recommendations   Physical Exam General: The patient is alert and cooperative at the time of the examination. Skin: No significant peripheral edema is noted.  Neurologic Exam Cranial nerves: Facial symmetry is present. Speech is very dysarthric, difficult to understand. The patient has a right homonymous visual field deficit. Motor: The patient has good strength in all 4 extremities.increased mild tone on left side Coordination: The patient has good finger-nose-finger and heel-to-shin bilaterally. Gait and station: The gait was not tested. Reflexes: Deep tendon reflexes are symmetric.  ASSESSMENT [name] is a 67 y.o. female presenting with severe dysarthria. Imaging confirms a tiny right frontal lobe infarct in setting of extensive bihemispheric infarcts. Infarcts felt to be likely embolic secondary to unknown source (does have a hx of low EF 15%).  Work up underway. On clopidogrel 75 mg orally every day prior to admission. Now on clopidogrel 75 mg orally every day for secondary stroke prevention.    Hx Polysubstance abuse, cocaine abuse  History of coronary artery disease, myocardial infarction  Schizophrenia  Multi-infarct state  COPD  Obstructive sleep apnea  History of breast cancer dyslipidemia   Congestive heart failure - hx low ejection fraction of 10-15%, not felt to be a Coumadin candidate previously.   Hospital day # 1  TREATMENT/PLAN  plavix alone for secondary stroke preventention  F/u 2-D echocardiogram  OOB - Physical, occupational, and speech therapy evaluation  ST assess diet  Annie Main, MSN, RN, ANVP-BC, ANP-BC,  GNP-BC [facility] Pager: [phone] 06/25/2012 11:44 AM  I have personally obtained a history, examined the patient, evaluated imaging results, and formulated the assessment and plan of care. I agree with the above.  Delia Heady, MD

## 2012-06-25 NOTE — Evaluation (Signed)
Physical Therapy Evaluation Patient Details Name: [name] MRN: [id number] DOB: 1946/01/26 Today's Date: 06/25/2012 Time: 4098-1191 PT Time Calculation (min): 16 min  PT Assessment / Plan / Recommendation Clinical Impression   Pt adm with incr slurring speech and found to have "tiny" Rt frontal infarct. Pt with h/o multiple infarcts and dementia. Pt appears to be at baseline for mobility and strength with no further PT needs identified.    PT Assessment  Patent does not need any further PT services    Follow Up Recommendations  No PT follow up;Supervision/Assistance - 24 hour    Does the patient have the potential to tolerate intense rehabilitation      Barriers to Discharge        Equipment Recommendations  None recommended by PT    Recommendations for Other Services     Frequency      Precautions / Restrictions Precautions Precautions: Fall Precaution Comments: cognitive deficits   Pertinent Vitals/Pain no apparent distress      Mobility  Bed Mobility Bed Mobility: Not assessed Supine to Sit: 5: Supervision Transfers Transfers: Sit to Stand;Stand to Sit Sit to Stand: 4: Min assist Stand to Sit: 5: Supervision Details for Transfer Assistance: pt pulls up with bil UEs on RW despite instructions otherwise (for safety) Ambulation/Gait Ambulation/Gait Assistance: 4: Min guard Ambulation Distance (Feet): 70 Feet Assistive device: Rolling walker Ambulation/Gait Assistance Details: pt slow and deliberate with ambulation, RW placement, and turning.  Gait Pattern: Step-to pattern;Decreased step length - right;Decreased hip/knee flexion - left;Left hip hike;Left foot flat;Trunk flexed Modified Rankin (Stroke Patients Only) Pre-Morbid Rankin Score: Moderately severe disability Modified Rankin: Moderately severe disability         Visit Information  Last PT Received On: 06/25/12 Assistance Needed: +1 PT/OT Co-Evaluation/Treatment: Yes    Subjective Data  Subjective: They won't let me go for a walk by myself (re: niece and sisters) Patient Stated Goal: go home with her sister (as PTA)   Prior Functioning  Home Living Lives With: Family (sisters, niece) Available Help at Discharge: Family;Available 24 hours/day Type of Home: Apartment Home Access: Stairs to enter Entrance Stairs-Number of Steps: 1 Home Layout: One level Bathroom Shower/Tub: Engineer, manufacturing systems: Standard Home Adaptive Equipment: Bedside commode/3-in-1;Walker - rolling;Straight cane;Shower chair with back Prior Function Level of Independence: Independent with assistive device(s) Able to Take Stairs?: Yes Driving: No Comments: walks inside with RW independently; outside with supervision Communication Communication: Expressive difficulties Dominant Hand: Right    Cognition  Cognition Arousal/Alertness: Awake/alert Behavior During Therapy: WFL for tasks assessed/performed Overall Cognitive Status: No family/caregiver present to determine baseline cognitive functioning (perseverating)    Extremity/Trunk Assessment Right Upper Extremity Assessment RUE ROM/Strength/Tone: Within functional levels RUE Sensation: WFL - Light Touch;WFL - Proprioception RUE Coordination: WFL - gross/fine motor Left Upper Extremity Assessment LUE ROM/Strength/Tone: Deficits LUE ROM/Strength/Tone Deficits: residual deficits from remote infarcts. dystonia Right Lower Extremity Assessment RLE ROM/Strength/Tone: WFL for tasks assessed Left Lower Extremity Assessment LLE ROM/Strength/Tone: Deficits LLE ROM/Strength/Tone Deficits: LLE with extensor tone and in external rotation during gait Trunk Assessment Trunk Assessment: Kyphotic   Balance Balance Balance Assessed: Yes Static Sitting Balance Static Sitting - Balance Support: No upper extremity supported;Feet supported Static Sitting - Level of Assistance: 7: Independent Dynamic Sitting Balance Dynamic Sitting - Balance  Support: No upper extremity supported Dynamic Sitting - Level of Assistance: 7: Independent (donning shoes; bending reaching forward) Static Standing Balance Static Standing - Balance Support: Bilateral upper extremity supported Static Standing - Level  of Assistance: 5: Stand by assistance  End of Session PT - End of Session Equipment Utilized During Treatment: Gait belt;Other (comment) (shoes (did better than slipper socks)) Activity Tolerance: Patient tolerated treatment well Patient left: in chair;with chair alarm set;with call bell/phone within reach  GP     Chey Cho 06/25/2012, 4:50 PM  06/25/2012 Veda Canning, PT Pager: [phone]

## 2012-06-25 NOTE — Progress Notes (Signed)
VASCULAR LAB PRELIMINARY  PRELIMINARY  PRELIMINARY  PRELIMINARY  Carotid Dopplers completed.    Preliminary report:  There is no ICA stenosis.  Vertebral artery flow is antegrade.  Nusaiba Guallpa, RVT 06/25/2012, 10:30 AM

## 2012-06-26 DIAGNOSIS — I728 Aneurysm of other specified arteries: Secondary | ICD-10-CM

## 2012-06-26 DIAGNOSIS — I10 Essential (primary) hypertension: Secondary | ICD-10-CM

## 2012-06-26 DIAGNOSIS — I6350 Cerebral infarction due to unspecified occlusion or stenosis of unspecified cerebral artery: Secondary | ICD-10-CM

## 2012-06-26 DIAGNOSIS — J449 Chronic obstructive pulmonary disease, unspecified: Secondary | ICD-10-CM

## 2012-06-26 MED ORDER — CIPROFLOXACIN HCL 500 MG PO TABS
500.0000 mg | ORAL_TABLET | Freq: Two times a day (BID) | ORAL | Status: DC
  Administered 2012-06-26 – 2012-06-27 (×2): 500 mg via ORAL
  Filled 2012-06-26 (×6): qty 1

## 2012-06-26 MED ORDER — CIPROFLOXACIN HCL 500 MG PO TABS: 500.0000 mg | ORAL_TABLET | Freq: Two times a day (BID) | ORAL | Status: DC

## 2012-06-26 NOTE — Progress Notes (Signed)
Stroke Team Progress Note  HISTORY [name] is a 67 y.o. female with a history of CHF with multiple previous strokes and dementia who presents with slurred speech that was present on awakening 20 minutes after going to bed(per EMS). The patient is unable to give me any clear history of the night's events, but does state that her slurred speech is new. She has dementia at baseline.  LKW: 12 am, though unclear as no independent source could be reached.   Patient was not a TPA candidate secondary to unknown time of onset and mild deficits  SUBJECTIVE Patient sleeping in the bed.  OBJECTIVE Most recent Vital Signs: Filed Vitals:   06/25/12 2113 06/26/12 0251 06/26/12 0650 06/26/12 1000  BP: 143/88 134/75 140/81 109/55  Pulse: 73 53 47 75  Temp: 97.7 F (36.5 C) 97.9 F (36.6 C) 97.6 F (36.4 C) 98 F (36.7 C)  TempSrc: Oral Axillary Axillary Oral  Resp: 20 15 15 16   Height:      Weight:      SpO2: 100% 99% 94% 100%   CBG (last 3)   Recent Labs  06/24/12 0059  GLUCAP 84   IV Fluid Intake:   . sodium chloride 100 mL/hr at 06/25/12 1523   MEDICATIONS  . antiseptic oral rinse  15 mL Mouth Rinse q12n4p  . budesonide-formoterol  2 puff Inhalation BID  . chlorhexidine  15 mL Mouth Rinse BID  . ciprofloxacin  500 mg Oral BID  . clopidogrel  75 mg Oral Daily  . famotidine  20 mg Oral BID  . feeding supplement  237 mL Oral BID BM  . heparin  5,000 Units Subcutaneous Q8H  . OLANZapine  2.5 mg Oral QHS  . pantoprazole  40 mg Oral Daily  . potassium chloride  10 mEq Oral Daily  . QUEtiapine  50 mg Oral Daily  . simvastatin  20 mg Oral QHS  . tiotropium  18 mcg Inhalation Daily   PRN:  albuterol  Diet:  Cardiac thin liquids Activity:  OOB DVT Prophylaxis:  SQ heparin  CLINICALLY SIGNIFICANT STUDIES Basic Metabolic Panel:   Recent Labs Lab 06/24/12 0059 06/24/12 0133 06/25/12 0517  NA 141 143 140  K 4.8 4.8 3.8  CL 102 104 107  CO2 31  --  23  GLUCOSE 75 75  83  BUN 16 17 8   CREATININE 0.92 1.00 0.85  CALCIUM 10.1  --  9.0   Liver Function Tests: No results found for this basename: AST, ALT, ALKPHOS, BILITOT, PROT, ALBUMIN,  in the last 168 hours CBC:   Recent Labs Lab 06/24/12 0059 06/24/12 0133 06/25/12 0517  WBC 4.4  --  3.1*  HGB 15.2* 16.3* 13.4  HCT 43.9 48.0* 38.9  MCV 89.8  --  88.4  PLT 211  --  194   Coagulation:   Recent Labs Lab 06/24/12 0059  LABPROT 12.8  INR 0.97   Cardiac Enzymes:   Recent Labs Lab 06/24/12 0802 06/24/12 1353 06/24/12 2015  TROPONINI <0.30 <0.30 <0.30   Urinalysis:   Recent Labs Lab 06/24/12 0704  COLORURINE YELLOW  LABSPEC 1.015  PHURINE 7.0  GLUCOSEU NEGATIVE  HGBUR NEGATIVE  BILIRUBINUR NEGATIVE  KETONESUR NEGATIVE  PROTEINUR NEGATIVE  UROBILINOGEN 0.2  NITRITE NEGATIVE  LEUKOCYTESUR SMALL*   Lipid Panel    Component Value Date/Time   CHOL 184 06/25/2012 0500   TRIG 55 06/25/2012 0500   HDL 90 06/25/2012 0500   CHOLHDL 2.0 06/25/2012 0500  VLDL 11 06/25/2012 0500   LDLCALC 83 06/25/2012 0500   HgbA1C  Lab Results  Component Value Date   HGBA1C 6.1* 06/25/2012    Urine Drug Screen:     Component Value Date/Time   LABOPIA NONE DETECTED 06/15/2011 1400   LABOPIA NEGATIVE 04/01/2009 1007   COCAINSCRNUR NONE DETECTED 06/15/2011 1400   COCAINSCRNUR  Value: POSITIVE (NOTE) Result repeated and verified. Sent for confirmatory testing* 04/01/2009 1007   LABBENZ NONE DETECTED 06/15/2011 1400   LABBENZ NEGATIVE 04/01/2009 1007   AMPHETMU NONE DETECTED 06/15/2011 1400   AMPHETMU NEGATIVE 04/01/2009 1007   THCU NONE DETECTED 06/15/2011 1400   LABBARB NONE DETECTED 06/15/2011 1400    Alcohol Level: No results found for this basename: ETH,  in the last 168 hours  CT of the brain  06/24/2012 1.  No acute intracranial abnormality. 2.  Stable appearance of multi focal bilateral remote cortical infarcts, atrophy, ventriculomegaly and sequela of longstanding microvascular ischemia.  MRI of the brain   06/24/2012  Tiny acute mid right frontal lobe infarct.  Multiple remote infarcts  MRA of the brain    2D Echocardiogram  Done, result pending  Carotid Doppler  No evidence of hemodynamically significant internal carotid artery stenosis. Vertebral artery flow is antegrade.   CXR  06/24/2012 Stable COPD. No active disease.  EKG  Sinus bradycardia, Left bundle branch block, Abnormal ECG  Therapy Recommendations   Physical Exam General: The patient is alert and cooperative at the time of the examination. Skin: No significant peripheral edema is noted.  Neurologic Exam Cranial nerves: Facial symmetry is present. Speech is very dysarthric, difficult to understand. The patient has a right homonymous visual field deficit. Motor: The patient has good strength in all 4 extremities.increased mild tone on left side Coordination: The patient has good finger-nose-finger and heel-to-shin bilaterally. Gait and station: The gait was not tested. Reflexes: Deep tendon reflexes are symmetric.  ASSESSMENT [name] is a 67 y.o. female presenting with severe dysarthria. Imaging confirms a tiny right frontal lobe infarct in setting of extensive bihemispheric infarcts. Infarcts felt to be likely embolic secondary to unknown source (does have a hx of low EF 15%).  Work up underway. On clopidogrel 75 mg orally every day prior to admission. Now on clopidogrel 75 mg orally every day for secondary stroke prevention.    Hx Polysubstance abuse, cocaine abuse  History of coronary artery disease, myocardial infarction  Schizophrenia  Multi-infarct state  COPD  Obstructive sleep apnea  History of breast cancer dyslipidemia   Congestive heart failure - hx low ejection fraction of 10-15%, not felt to be a Coumadin candidate previously.  Cachetic, Body mass index is 16.52 kg/(m^2).    Hospital day # 2  TREATMENT/PLAN  plavix alone for secondary stroke prevention. She has no indication to  continue aspirin and plavix together. Dual antiplatelets increases the right of intracerebral hemorrhage.  F/u 2-D echocardiogram  Home health therapy  No further stroke workup indicated. Patient has a 10-15% risk of having another stroke over the next year, the highest risk is within 2 weeks of the most recent stroke/TIA (risk of having a stroke following a stroke or TIA is the same). Ongoing risk factor control by Primary Care Physician Stroke Service will sign off. Please call should any needs arise. Follow up with Dr. Pearlean Brownie, Stroke Clinic, in 2 months.  Annie Main, MSN, RN, ANVP-BC, ANP-BC, GNP-BC [facility] Pager: [phone] 06/26/2012 12:00 PM  I have personally obtained  a history, examined the patient, evaluated imaging results, and formulated the assessment and plan of care. I agree with the above.  Delia Heady, MD

## 2012-06-26 NOTE — Discharge Summary (Addendum)
Physician Discharge Summary  [name] ZOX:[id number] DOB: 1945-12-08 DOA: 06/24/2012  PCP: Robynn Pane, MD  Admit date: 06/24/2012 Discharge date: 06/26/2012  Time spent: 35 minutes  Recommendations for Outpatient Follow-up:  1. Follow up with PCP for further care of medical condition. 2. Follow up with Dr Pearlean Brownie.   Discharge Diagnoses:    Acute ischemic stroke   HYPERTENSION, BENIGN ESSENTIAL   COPD   Discharge Condition: Stable  Diet recommendation: Heart Healthy  Filed Weights   06/24/12 2100  Weight: 39.644 kg (87 lb 6.4 oz)    History of present illness:  [name] is a 67 y.o. female who presents to the ED with slurred speech that was present on awakening 20 mins after going to bed. Slurred speech is apparently new though it is very difficult to get further history from the patient due to her being difficult to understand from dysarthria at this point in time. From her chart and CT scan of her head it is obvious she has had numerous previous strokes.  CT scan in the ED showed no acute findings, hospitalist asked to admit for new CVA.   Hospital Course:  Acute ischemic stroke: patient presents with new onset dysarthria. MRI show small acute mid right frontal lobe infarct, MRA pending. ECHO pending. . Work up for infection recommended by neuro in process: Urine culture no significant growth, blood culture no growth to date.  Chest x ray no active lung diseases. HB-A1c 6.1, LDL 83. Carotid doppler: no ICA stenosis. Vertebral artery flow is antegrade.   -Neuro recommend to continue with plavix only. No indication for dual therapy.  -Pt, OT evaluation no need for PT out patient.  -Patient pass swallow evaluation. She will need speech therapy follow up.  -Patient need 24 hour supervision. CM helping with Home needs. We have not been able to contact family. Discharge delay for this reason.  COPD - schedule Nebulizer treatments.  Elevated troponin - troponin of 0.13.  Patient has had elevated point of care marker increase in the pass. cycle cardiac enzymes negative times 3. ECHO pending.  HTN - Hold coreg SBP in the 100 range, and bradycardia. Continue with lasix.  Tachycardia: resolved, now bradycardia.  Pyuria: Continue with Ciprofloxacin day 2. urine culture no significant growth. Severe Malnutrition: ensue.    Procedures:  none  Consultations: Neurology, Dr Pearlean Brownie.   Discharge Exam: Filed Vitals:   06/25/12 2113 06/26/12 0251 06/26/12 0650 06/26/12 1000  BP: 143/88 134/75 140/81 109/55  Pulse: 73 53 47 75  Temp: 97.7 F (36.5 C) 97.9 F (36.6 C) 97.6 F (36.4 C) 98 F (36.7 C)  TempSrc: Oral Axillary Axillary Oral  Resp: 20 15 15 16   Height:      Weight:      SpO2: 100% 99% 94% 100%    General: No distress,  dysarthria.  Cardiovascular: S 1, S 2 RRR Respiratory: CTA  Discharge Instructions  Discharge Orders   Future Orders Complete By Expires     Diet - low sodium heart healthy  As directed     Increase activity slowly  As directed         Medication List    STOP taking these medications       aspirin 81 MG tablet     carvedilol 3.125 MG tablet  Commonly known as:  COREG     omeprazole 20 MG capsule  Commonly known as:  PRILOSEC      TAKE these medications  budesonide-formoterol 80-4.5 MCG/ACT inhaler  Commonly known as:  SYMBICORT  Inhale 2 puffs into the lungs 2 (two) times daily.     ciprofloxacin 500 MG tablet  Commonly known as:  CIPRO  Take 1 tablet (500 mg total) by mouth 2 (two) times daily.     clopidogrel 75 MG tablet  Commonly known as:  PLAVIX  Take 75 mg by mouth daily.     famotidine 20 MG tablet  Commonly known as:  PEPCID  Take 1 tablet (20 mg total) by mouth 2 (two) times daily.     furosemide 40 MG tablet  Commonly known as:  LASIX  Take 40 mg by mouth daily.     nitroGLYCERIN 0.4 MG SL tablet  Commonly known as:  NITROSTAT  Place 0.4 mg under the tongue every 5 (five)  minutes as needed. For chest pain     OLANZapine 2.5 MG tablet  Commonly known as:  ZYPREXA  Take 1 tablet (2.5 mg total) by mouth at bedtime.     potassium chloride 10 MEQ tablet  Commonly known as:  K-DUR,KLOR-CON  Take 1 tablet (10 mEq total) by mouth daily.     PROVENTIL 90 MCG/ACT inhaler  Generic drug:  albuterol  Inhale 2 puffs into the lungs every 6 (six) hours as needed. For wheezing       QUEtiapine 50 MG tablet  Commonly known as:  SEROQUEL  Take 50 mg by mouth daily.     simvastatin 20 MG tablet  Commonly known as:  ZOCOR  Take 1 tablet (20 mg total) by mouth at bedtime.     tiotropium 18 MCG inhalation capsule  Commonly known as:  SPIRIVA  Place 1 capsule (18 mcg total) into inhaler and inhale daily.       Allergies  Allergen Reactions  . Codeine Itching    All over the body  . Penicillins Hives and Other (See Comments)    "Whelps" per patient.  . Hydralazine     02/28/11 Family unsure if this is allergy for pt       Follow-up Information   Follow up with [facility]. [facility] RN, Social Worker and Speech Therapy)    Contact information:   [phone]      Follow up with Gates Rigg, MD. Schedule an appointment as soon as possible for a visit in 2 months. (stroke clinic)    Contact information:   [address] [place] [address] [phone]        The results of significant diagnostics from this hospitalization (including imaging, microbiology, ancillary and laboratory) are listed below for reference.    Significant Diagnostic Studies: Dg Chest 2 View  06/24/2012  *RADIOLOGY REPORT*  Clinical Data: Cough  CHEST - 2 VIEW  Comparison: 10/06/2011  Findings: The cardiomediastinal silhouette is stable.  Again noted hyperinflation and chronic mild interstitial prominence.  Stable old left rib fractures.  No acute infiltrate or pulmonary edema.  IMPRESSION: Stable COPD.  No active disease.   Original Report  Authenticated By: Natasha Mead, M.D.    Ct Head Wo Contrast  06/24/2012  *RADIOLOGY REPORT*  Clinical Data: Code stroke, dysarthria  CT HEAD WITHOUT CONTRAST  Technique:  Contiguous axial images were obtained from the base of the skull through the vertex without contrast.  Comparison: Prior head CT 10/09/2011  Findings: No acute intracranial hemorrhage, acute infarction, mass lesion, mass effect, midline shift or hydrocephalus.  Gray-white differentiation is preserved throughout.  Similar appearance  of a multi focal cortical encephalomalacia consistent with prior infarctions in the right frontal lobe, right parietal lobe, left posterior frontal lobe, left occipital lobe.  Additionally, there is periventricular, subcortical white matter hypoattenuation consistent with the sequela of longstanding microvascular ischemia. Given the overall extent of underlying chronic infarction, a new acute or subacute focus of ischemia would be difficult to detect. Extensive calcification along the falx, stable.  Globes are intact and orbits unremarkable.  No focal soft tissue or calvarial abnormality.  Normal aeration of the bilateral mastoid air cells and paranasal sinuses.  IMPRESSION:  1.  No acute intracranial abnormality. 2.  Stable appearance of multi focal bilateral remote cortical infarcts, atrophy, ventriculomegaly and sequela of longstanding microvascular ischemia.   Original Report Authenticated By: Malachy Moan, M.D.    Mr Brain Wo Contrast  06/24/2012  *RADIOLOGY REPORT*  Clinical Data: Slurred speech.  MRI HEAD WITHOUT CONTRAST  Technique:  Multiplanar, multiecho pulse sequences of the brain and surrounding structures were obtained according to standard protocol without intravenous contrast.  Comparison: 06/24/2012 CT.  10/07/2011 MR.  Findings: Multiple remote hemispheric infarcts with encephalomalacia involving portions of the; medial right frontal lobe, medial right parietal lobe, lateral posterior right  temporal lobe, right occipital lobe, medial left temporal lobe, left occipital lobe, left frontal opercular region and right cerebellum. Blood breakdown products noted involving the posterior right temporal - parietal lobe remote infarct.  Tiny acute mid right frontal lobe infarct.  Prominent ossification of the falx.  No intracranial mass lesion detected on this unenhanced exam.  Global atrophy.  Ventricular prominence felt to be related to atrophy and result of prior infarcts.  Major intracranial vascular structures are patent.  Limited evaluation of branch vessels.  IMPRESSION: Tiny acute mid right frontal lobe infarct.  Multiple remote infarcts as detailed above.   Original Report Authenticated By: Lacy Duverney, M.D.    Mr Mra Head/brain Wo Cm  06/25/2012  *RADIOLOGY REPORT*  Clinical Data: Stroke.  Slurred speech.  MRA HEAD WITHOUT CONTRAST  Technique: Angiographic images of the Circle of Willis were obtained using MRA technique without intravenous contrast.  Comparison: MRI head 06/24/2012  Findings: Image quality degraded by motion.  Distal right vertebral artery appears to end in pica and does not have significant contribution to the basilar.  There may be significant disease in the distal right vertebral artery.  Distal left vertebral artery is patent to  the basilar.  There is mild disease in the distal left vertebral and proximal basilar.  The basilar is patent.  Both posterior cerebral arteries are patent with probable disease in the left posterior cerebral artery.  There is a patent posterior communicating artery on the right.  Right internal carotid artery is patent.  There is artifact in the segment through the skull base due to motion.    There is mild to moderate stenosis in the right A2 segment.  Right middle cerebral artery branches are patent proximally and  not well evaluated distally due to motion.  The patient has   significant right MCA chronic infarction.  There is also a chronic right ACA  infarction. The right anterior cerebral artery is diffusely diseased and appears to be occluded proximally.  The left anterior cerebral artery is supplied from the right and also is severely diseased.  Left internal carotid artery is patent.  Left A1 segment is hypoplastic.  Left M1 segment is patent. There is disease in left middle cerebral artery branches which are not well evaluated due to  motion.  IMPRESSION: Image quality is moderately degraded by motion.  There is diffuse atherosclerotic disease in the anterior and posterior circulation.  Severe disease in the anterior cerebral artery bilaterally, right greater than left.  Middle cerebral artery branches are not well evaluated due to motion.   Original Report Authenticated By: Janeece Riggers, M.D.     Microbiology: Recent Results (from the past 240 hour(s))  URINE CULTURE     Status: None   Collection Time    06/24/12  7:04 AM      Result Value Range Status   Specimen Description URINE, RANDOM   Final   Special Requests NONE   Final   Culture  Setup Time 06/24/2012 15:20   Final   Colony Count 20,OOO COLONIES/ML   Final   Culture     Final   Value: Multiple bacterial morphotypes present, none predominant. Suggest appropriate recollection if clinically indicated.   Report Status 06/25/2012 FINAL   Final  CULTURE, BLOOD (ROUTINE X 2)     Status: None   Collection Time    06/24/12  7:49 AM      Result Value Range Status   Specimen Description BLOOD RIGHT ANTECUBITAL   Final   Special Requests BOTTLES DRAWN AEROBIC ONLY 10CC   Final   Culture  Setup Time 06/24/2012 15:21   Final   Culture     Final   Value:        BLOOD CULTURE RECEIVED NO GROWTH TO DATE CULTURE WILL BE HELD FOR 5 DAYS BEFORE ISSUING A FINAL NEGATIVE REPORT   Report Status PENDING   Incomplete  CULTURE, BLOOD (ROUTINE X 2)     Status: None   Collection Time    06/24/12  8:03 AM      Result Value Range Status   Specimen Description BLOOD HAND LEFT   Final   Special  Requests BOTTLES DRAWN AEROBIC ONLY 1CC   Final   Culture  Setup Time 06/24/2012 15:21   Final   Culture     Final   Value:        BLOOD CULTURE RECEIVED NO GROWTH TO DATE CULTURE WILL BE HELD FOR 5 DAYS BEFORE ISSUING A FINAL NEGATIVE REPORT   Report Status PENDING   Incomplete     Labs: Basic Metabolic Panel:  Recent Labs Lab 06/24/12 0059 06/24/12 0133 06/25/12 0517  NA 141 143 140  K 4.8 4.8 3.8  CL 102 104 107  CO2 31  --  23  GLUCOSE 75 75 83  BUN 16 17 8   CREATININE 0.92 1.00 0.85  CALCIUM 10.1  --  9.0   CBC:  Recent Labs Lab 06/24/12 0059 06/24/12 0133 06/25/12 0517  WBC 4.4  --  3.1*  HGB 15.2* 16.3* 13.4  HCT 43.9 48.0* 38.9  MCV 89.8  --  88.4  PLT 211  --  194   Cardiac Enzymes:  Recent Labs Lab 06/24/12 0059 06/24/12 0802 06/24/12 1353 06/24/12 2015  TROPONINI <0.30 <0.30 <0.30 <0.30   BNP: BNP (last 3 results) No results found for this basename: PROBNP,  in the last 8760 hours CBG:  Recent Labs Lab 06/24/12 0059  GLUCAP 84       Signed:  Arthur Speagle  [facility] 06/26/2012, 12:54 PM

## 2012-06-26 NOTE — Evaluation (Signed)
Physical Therapy Evaluation and Discharge Patient Details Name: [name] MRN: [id number] DOB: 04/20/1945 Today's Date: 06/26/2012 Time: 4098-1191 PT Time Calculation (min): 26 min  PT Assessment / Plan / Recommendation Clinical Impression    Pt adm with incr slurring speech and found to have "tiny" Rt frontal infarct. Pt with h/o multiple infarcts and dementia. RN called stating pt requiring 2 person assist to walk today and MD wanted PT to evaluate again today for PT needs (see also eval of 5/5). Pt performed as she did yesterday, although slightly more dyspneic with ambulation. Instructed RN that she does better with her own shoes (instead of hospital slipper socks)--as noted yesterday. Pt known from previous admissions and she appears to be at baseline for mobility and strength with no further PT needs identified.     PT Assessment  Patent does not need any further PT services    Follow Up Recommendations  No PT follow up;Supervision/Assistance - 24 hour    Does the patient have the potential to tolerate intense rehabilitation      Barriers to Discharge        Equipment Recommendations  None recommended by PT    Recommendations for Other Services     Frequency      Precautions / Restrictions Precautions Precautions: Fall   Pertinent Vitals/Pain Reports Lt knee pain with ambulation "I don't know that I did anything to it"      Mobility  Bed Mobility Bed Mobility: Supine to Sit;Sitting - Scoot to [place] of Bed;Sit to Supine;Scooting to [facility] Supine to Sit: 6: Modified independent (Device/Increase time);With rails Sitting - Scoot to Edge of Bed: 6: Modified independent (Device/Increase time) Sit to Supine: 4: Min assist Scooting to [facility]: 6: Modified independent (Device/Increase time) Details for Bed Mobility Assistance: entered bed from the right, pt required help raising LLE onto bed (states she normally gets into bed on other side); pt then assisted OOB to use BSC; and  then back to bed Transfers Transfers: Sit to Stand;Stand to Sit Sit to Stand: 4: Min guard;5: Supervision Stand to Sit: 4: Min guard;5: Supervision Details for Transfer Assistance: from chair, pt supervision with RW; to/from [facility] with no device, pt minguard assist (she transitions hands from bed/rail to armrests of BSC and back again) Ambulation/Gait Ambulation/Gait Assistance: 4: Min guard Ambulation Distance (Feet): 12 Feet Assistive device: Rolling walker (her shoes (does better than with gripper socks)) Ambulation/Gait Assistance Details: pt slow and deliberate with ambulation, RW placement, and turning. Pt reported Lt knee hurting today and unsure why. Pt deferred walking further Gait Pattern: Step-to pattern;Decreased step length - right;Decreased hip/knee flexion - left;Left hip hike;Left foot flat;Trunk flexed Modified Rankin (Stroke Patients Only) Pre-Morbid Rankin Score: Moderately severe disability Modified Rankin: Moderately severe disability            Visit Information  Last PT Received On: 06/26/12 Assistance Needed: +1    Subjective Data  Subjective: Is it time to eat? Patient Stated Goal: go home with her sister (as PTA)   Prior Functioning  Home Living Lives With: Family (sisters, niece) Available Help at Discharge: Family;Available 24 hours/day Type of Home: Apartment Home Access: Stairs to enter Entrance Stairs-Number of Steps: 1 Home Layout: One level Bathroom Shower/Tub: Engineer, manufacturing systems: Standard Home Adaptive Equipment: Bedside commode/3-in-1;Walker - rolling;Straight cane;Shower chair with back Prior Function Level of Independence: Independent with assistive device(s) Able to Take Stairs?: Yes Driving: No Communication Communication: Expressive difficulties Dominant Hand: Right    Cognition  Cognition Arousal/Alertness: Awake/alert (sleeping on arrival; once aroused alert) Behavior During Therapy: WFL for tasks  assessed/performed Overall Cognitive Status: No family/caregiver present to determine baseline cognitive functioning (perseverating)    Extremity/Trunk Assessment Right Lower Extremity Assessment RLE ROM/Strength/Tone: WFL for tasks assessed Left Lower Extremity Assessment LLE ROM/Strength/Tone: Deficits LLE ROM/Strength/Tone Deficits: LLE with extensor tone and in external rotation during gait Trunk Assessment Trunk Assessment: Kyphotic   Balance Static Sitting Balance Static Sitting - Balance Support: No upper extremity supported;Feet supported Static Sitting - Level of Assistance: 7: Independent Dynamic Sitting Balance Dynamic Sitting - Balance Support: No upper extremity supported Dynamic Sitting - Level of Assistance: 7: Independent (bending to reach shoes; bringing feet up to don shoes) Static Standing Balance Static Standing - Balance Support: Bilateral upper extremity supported Static Standing - Level of Assistance: 5: Stand by assistance  End of Session PT - End of Session Equipment Utilized During Treatment: Gait belt;Other (comment) (her shoes) Activity Tolerance: Patient limited by fatigue Patient left: in bed;with call bell/phone within reach;with bed alarm set Nurse Communication: Mobility status;Other (comment) (does better with her shoes;)  GP     [name] 06/26/2012, 3:29 PM  06/26/2012 Veda Canning, PT Pager: [phone]

## 2012-06-26 NOTE — Progress Notes (Signed)
   CARE MANAGEMENT NOTE 06/26/2012  Patient:  ACHAIA, GARLOCK   Account Number:  [id number]  Date Initiated:  06/25/2012  Documentation initiated by:  [facility]  Subjective/Objective Assessment:   admitted with CVA     Action/Plan:   PT/OT evals   Anticipated DC Date:  06/28/2012   Anticipated DC Plan:  HOME W HOME HEALTH SERVICES      DC Planning Services  CM consult      [facility] Choice  HOME HEALTH   Choice offered to / List presented to:  C-1 Patient        HH arranged  HH-1 RN  HH-10 DISEASE MANAGEMENT  HH-6 SOCIAL WORKER  HH-5 SPEECH THERAPY      HH agency  [facility]   Status of service:  Completed, signed off Medicare Important Message given?   (If response is "NO", the following Medicare IM given date fields will be blank) Date Medicare IM given:   Date Additional Medicare IM given:    Discharge Disposition:  HOME W HOME HEALTH SERVICES  Per UR Regulation:  Reviewed for med. necessity/level of care/duration of stay  If discussed at Long Length of Stay Meetings, dates discussed:    Comments:  06/26/2012 1100 NCM spoke to pt and states she had [facility] recently and completed HH. She has aide that comes during the week but not sure of agency. She lives at home with niece, [name]. She has RW at home. Notified [facility] of referral for [facility]. [facility] contact info added to dc instructions. Isidoro Donning RN CCM Case Mgmt phone [phone]

## 2012-06-27 DIAGNOSIS — I10 Essential (primary) hypertension: Secondary | ICD-10-CM

## 2012-06-27 DIAGNOSIS — F209 Schizophrenia, unspecified: Secondary | ICD-10-CM

## 2012-06-27 NOTE — Progress Notes (Signed)
[facility] PROGRESS NOTE  [name] JYN:[id number] DOB: [birth year] DOA: 06/24/2012 PCP: Robynn Pane, MD  Assessment/Plan: #1 acute right frontal CVA Patient with a dysarthria. MRI showed an acute mid right frontal lobe infarct. Carotid Dopplers were negative. Fasting lipid panel with LDL of 83. Continue Zocor. Patient was seen in consultation by neurology and recommended treatment with Plavix only. Patient is to followup with neurology as outpatient.  #2 pyuria Urine cultures with multiple bacterial morphotypes. Patient is currently afebrile. Will discontinue ciprofloxacin.  #3 hypertension Stable. Continue current regimen.  #4 COPD Stable. Continue Spiriva.  #5 schizophrenia Stable. Continue Zyprexa and Seroquel.  Code Status: Full Family Communication: Updated patient and daughter at bedside. Disposition Plan: Home today   Consultants:  Neurology: Dr. Amada Jupiter 06/24/2012  Procedures:  MRI of the head 06/25/2012  CT of the head 06/24/2012  Carotid Dopplers 06/25/2012  Chest x-ray of 06/24/2012  Antibiotics:  Ciprofloxacin 06/26/2012  HPI/Subjective: Patient with no complaints. Patient with some dysarthria. Family at bedside ready to take patient home.  Objective: Filed Vitals:   06/27/12 0213 06/27/12 0606 06/27/12 0823 06/27/12 0943  BP: 135/85 132/84  130/83  Pulse: 50 47  59  Temp: 97.9 F (36.6 C) 97.4 F (36.3 C)  97.3 F (36.3 C)  TempSrc: Oral Oral  Oral  Resp: 18 18  18   Height:      Weight:      SpO2: 100% 100% 100% 98%    Intake/Output Summary (Last 24 hours) at 06/27/12 1343 Last data filed at 06/27/12 8657  Gross per 24 hour  Intake    240 ml  Output      0 ml  Net    240 ml   Filed Weights   06/24/12 2100  Weight: 39.644 kg (87 lb 6.4 oz)    Exam:   General:  NAD  Cardiovascular: RRR  Respiratory: ctab  Abdomen: Soft, nontender, nondistended, positive bowel sounds.  Extremities: No clubbing cyanosis  or edema  Data Reviewed: Basic Metabolic Panel:  Recent Labs Lab 06/24/12 0059 06/24/12 0133 06/25/12 0517  NA 141 143 140  K 4.8 4.8 3.8  CL 102 104 107  CO2 31  --  23  GLUCOSE 75 75 83  BUN 16 17 8   CREATININE 0.92 1.00 0.85  CALCIUM 10.1  --  9.0   Liver Function Tests: No results found for this basename: AST, ALT, ALKPHOS, BILITOT, PROT, ALBUMIN,  in the last 168 hours No results found for this basename: LIPASE, AMYLASE,  in the last 168 hours No results found for this basename: AMMONIA,  in the last 168 hours CBC:  Recent Labs Lab 06/24/12 0059 06/24/12 0133 06/25/12 0517  WBC 4.4  --  3.1*  HGB 15.2* 16.3* 13.4  HCT 43.9 48.0* 38.9  MCV 89.8  --  88.4  PLT 211  --  194   Cardiac Enzymes:  Recent Labs Lab 06/24/12 0059 06/24/12 0802 06/24/12 1353 06/24/12 2015  TROPONINI <0.30 <0.30 <0.30 <0.30   BNP (last 3 results) No results found for this basename: PROBNP,  in the last 8760 hours CBG:  Recent Labs Lab 06/24/12 0059  GLUCAP 84    Recent Results (from the past 240 hour(s))  URINE CULTURE     Status: None   Collection Time    06/24/12  7:04 AM      Result Value Range Status   Specimen Description URINE, RANDOM   Final   Special Requests NONE   Final  Culture  Setup Time 06/24/2012 15:20   Final   Colony Count 20,OOO COLONIES/ML   Final   Culture     Final   Value: Multiple bacterial morphotypes present, none predominant. Suggest appropriate recollection if clinically indicated.   Report Status 06/25/2012 FINAL   Final  CULTURE, BLOOD (ROUTINE X 2)     Status: None   Collection Time    06/24/12  7:49 AM      Result Value Range Status   Specimen Description BLOOD RIGHT ANTECUBITAL   Final   Special Requests BOTTLES DRAWN AEROBIC ONLY 10CC   Final   Culture  Setup Time 06/24/2012 15:21   Final   Culture     Final   Value:        BLOOD CULTURE RECEIVED NO GROWTH TO DATE CULTURE WILL BE HELD FOR 5 DAYS BEFORE ISSUING A FINAL NEGATIVE  REPORT   Report Status PENDING   Incomplete  CULTURE, BLOOD (ROUTINE X 2)     Status: None   Collection Time    06/24/12  8:03 AM      Result Value Range Status   Specimen Description BLOOD HAND LEFT   Final   Special Requests BOTTLES DRAWN AEROBIC ONLY 1CC   Final   Culture  Setup Time 06/24/2012 15:21   Final   Culture     Final   Value:        BLOOD CULTURE RECEIVED NO GROWTH TO DATE CULTURE WILL BE HELD FOR 5 DAYS BEFORE ISSUING A FINAL NEGATIVE REPORT   Report Status PENDING   Incomplete     Studies: Mr Maxine Glenn Head/brain Wo Cm  06/25/2012  *RADIOLOGY REPORT*  Clinical Data: Stroke.  Slurred speech.  MRA HEAD WITHOUT CONTRAST  Technique: Angiographic images of the Circle of Willis were obtained using MRA technique without intravenous contrast.  Comparison: MRI head 06/24/2012  Findings: Image quality degraded by motion.  Distal right vertebral artery appears to end in pica and does not have significant contribution to the basilar.  There may be significant disease in the distal right vertebral artery.  Distal left vertebral artery is patent to  the basilar.  There is mild disease in the distal left vertebral and proximal basilar.  The basilar is patent.  Both posterior cerebral arteries are patent with probable disease in the left posterior cerebral artery.  There is a patent posterior communicating artery on the right.  Right internal carotid artery is patent.  There is artifact in the segment through the skull base due to motion.    There is mild to moderate stenosis in the right A2 segment.  Right middle cerebral artery branches are patent proximally and  not well evaluated distally due to motion.  The patient has   significant right MCA chronic infarction.  There is also a chronic right ACA infarction. The right anterior cerebral artery is diffusely diseased and appears to be occluded proximally.  The left anterior cerebral artery is supplied from the right and also is severely diseased.  Left  internal carotid artery is patent.  Left A1 segment is hypoplastic.  Left M1 segment is patent. There is disease in left middle cerebral artery branches which are not well evaluated due to motion.  IMPRESSION: Image quality is moderately degraded by motion.  There is diffuse atherosclerotic disease in the anterior and posterior circulation.  Severe disease in the anterior cerebral artery bilaterally, right greater than left.  Middle cerebral artery branches are not well evaluated due  to motion.   Original Report Authenticated By: Janeece Riggers, M.D.     Scheduled Meds: . antiseptic oral rinse  15 mL Mouth Rinse q12n4p  . budesonide-formoterol  2 puff Inhalation BID  . chlorhexidine  15 mL Mouth Rinse BID  . ciprofloxacin  500 mg Oral BID  . clopidogrel  75 mg Oral Daily  . famotidine  20 mg Oral BID  . feeding supplement  237 mL Oral BID BM  . heparin  5,000 Units Subcutaneous Q8H  . OLANZapine  2.5 mg Oral QHS  . pantoprazole  40 mg Oral Daily  . potassium chloride  10 mEq Oral Daily  . QUEtiapine  50 mg Oral Daily  . simvastatin  20 mg Oral QHS  . tiotropium  18 mcg Inhalation Daily   Continuous Infusions:   Principal Problem:   Acute ischemic stroke Active Problems:   HYPERTENSION, BENIGN ESSENTIAL   COPD    Time spent: > 35 mins    [facility]  [facility] Pager [phone]. If 7PM-7AM, please contact night-coverage at [email or web address], password [facility] 06/27/2012, 1:43 PM  LOS: 3 days

## 2012-06-27 NOTE — Care Management Note (Signed)
    Page 1 of 2   06/27/2012     2:38:36 PM   CARE MANAGEMENT NOTE 06/27/2012  Patient:  [name], [name]   Account Number:  [id number]  Date Initiated:  06/25/2012  Documentation initiated by:  [facility]  Subjective/Objective Assessment:   admitted with CVA     Action/Plan:   PT/OT evals-PT and OT not recommending follow uup   Anticipated DC Date:  06/28/2012   Anticipated DC Plan:  HOME W HOME HEALTH SERVICES      DC Planning Services  CM consult      [facility] Choice  HOME HEALTH   Choice offered to / List presented to:  C-1 Patient        HH arranged  HH-1 RN  HH-10 DISEASE MANAGEMENT  HH-6 SOCIAL WORKER  HH-5 SPEECH THERAPY      HH agency  [facility]   Status of service:  Completed, signed off Medicare Important Message given?   (If response is "NO", the following Medicare IM given date fields will be blank) Date Medicare IM given:   Date Additional Medicare IM given:    Discharge Disposition:  HOME W HOME HEALTH SERVICES  Per UR Regulation:  Reviewed for med. necessity/level of care/duration of stay  If discussed at Long Length of Stay Meetings, dates discussed:    Comments:  06/27/12 Received approval from patient to call family. Called phone number listed for patient's sister [name], spoke with gentleman stating that he is patient's brother, explained that I needed to speak with family member due to patient being discharged. He stated that patient lives with their niece and that she will come to pick up the patient. I explained that she will needc 24hr supervison/assist. He stated that theoir niece would be able to care for her. Notified patient RN of conversation.Jacquelynn Cree RN, BSN, CCM    06/26/2012 1100 NCM spoke to pt and states she had [facility] recently and completed HH. She has aide that comes during the week but not sure of agency. She lives at home with niece, [name]. She has RW at home. Notified [facility] of referral for [facility]. [facility] contact info added to  dc instructions. Isidoro Donning RN CCM Case Mgmt phone [phone]

## 2012-06-27 NOTE — Clinical Social Work Psychosocial (Signed)
Clinical Social Work Department BRIEF PSYCHOSOCIAL ASSESSMENT 06/27/2012  Patient:  ADISSON, DEAK     Account Number:  [id number]     Admit date:  06/24/2012  Clinical Social Worker:  Demetrios Loll  Date/Time:  06/27/2012 03:48 PM  Referred by:  Physician  Date Referred:  06/27/2012 Referred for  SNF Placement   Other Referral:   Interview type:  Patient Other interview type:    PSYCHOSOCIAL DATA Living Status:  FAMILY Admitted from facility:   Level of care:   Primary support name:  Miller,Rhonda Primary support relationship to patient:  FAMILY Degree of support available:   adequate.    CURRENT CONCERNS Current Concerns  None Noted   Other Concerns:    SOCIAL WORK ASSESSMENT / PLAN CSW received consult for SNF, however PT was not recommending SNF. CSW met with pt briefly prior to discharge. Pt discharged home with home health services arranged by [facility]. Pt's counsin provided transportation and pt's niece lives with pt. Pt is agreeable to going home. CSW is signing off as no further needs identified.   Assessment/plan status:  No Further Intervention Required Other assessment/ plan:   Information/referral to community resources:   Pt will recieve home health services.    PATIENT'S/FAMILY'S RESPONSE TO PLAN OF CARE: Pt is agreeable to discharge plan.   Dede Query, MSW, LCSW [phone]

## 2012-06-28 ENCOUNTER — Emergency Department (HOSPITAL_COMMUNITY)
Admission: EM | Admit: 2012-06-28 | Discharge: 2012-06-29 | Disposition: A | Discharge status: Home / Self Care | Payer: Medicare Other | Attending: Emergency Medicine | Admitting: Emergency Medicine

## 2012-06-28 ENCOUNTER — Emergency Department (HOSPITAL_COMMUNITY): Payer: Medicare Other

## 2012-06-28 ENCOUNTER — Other Ambulatory Visit: Payer: Self-pay

## 2012-06-28 ENCOUNTER — Encounter (HOSPITAL_COMMUNITY): Payer: Self-pay | Admitting: Unknown Physician Specialty

## 2012-06-28 DIAGNOSIS — Z880 Allergy status to penicillin: Secondary | ICD-10-CM | POA: Insufficient documentation

## 2012-06-28 DIAGNOSIS — Z8669 Personal history of other diseases of the nervous system and sense organs: Secondary | ICD-10-CM | POA: Insufficient documentation

## 2012-06-28 DIAGNOSIS — F0390 Unspecified dementia without behavioral disturbance: Secondary | ICD-10-CM | POA: Insufficient documentation

## 2012-06-28 DIAGNOSIS — Z7902 Long term (current) use of antithrombotics/antiplatelets: Secondary | ICD-10-CM | POA: Insufficient documentation

## 2012-06-28 DIAGNOSIS — J441 Chronic obstructive pulmonary disease with (acute) exacerbation: Secondary | ICD-10-CM

## 2012-06-28 DIAGNOSIS — Z853 Personal history of malignant neoplasm of breast: Secondary | ICD-10-CM | POA: Insufficient documentation

## 2012-06-28 DIAGNOSIS — F209 Schizophrenia, unspecified: Secondary | ICD-10-CM | POA: Insufficient documentation

## 2012-06-28 DIAGNOSIS — F411 Generalized anxiety disorder: Secondary | ICD-10-CM | POA: Insufficient documentation

## 2012-06-28 DIAGNOSIS — I509 Heart failure, unspecified: Secondary | ICD-10-CM | POA: Insufficient documentation

## 2012-06-28 DIAGNOSIS — Z79899 Other long term (current) drug therapy: Secondary | ICD-10-CM | POA: Insufficient documentation

## 2012-06-28 DIAGNOSIS — Z8674 Personal history of sudden cardiac arrest: Secondary | ICD-10-CM | POA: Insufficient documentation

## 2012-06-28 DIAGNOSIS — I2510 Atherosclerotic heart disease of native coronary artery without angina pectoris: Secondary | ICD-10-CM | POA: Insufficient documentation

## 2012-06-28 DIAGNOSIS — I10 Essential (primary) hypertension: Secondary | ICD-10-CM | POA: Insufficient documentation

## 2012-06-28 DIAGNOSIS — Z87891 Personal history of nicotine dependence: Secondary | ICD-10-CM | POA: Insufficient documentation

## 2012-06-28 DIAGNOSIS — Z8673 Personal history of transient ischemic attack (TIA), and cerebral infarction without residual deficits: Secondary | ICD-10-CM | POA: Insufficient documentation

## 2012-06-28 DIAGNOSIS — Z8781 Personal history of (healed) traumatic fracture: Secondary | ICD-10-CM | POA: Insufficient documentation

## 2012-06-28 DIAGNOSIS — G4733 Obstructive sleep apnea (adult) (pediatric): Secondary | ICD-10-CM | POA: Insufficient documentation

## 2012-06-28 LAB — POCT I-STAT 3, ART BLOOD GAS (G3+)
Bicarbonate: 24.7 mEq/L — ABNORMAL HIGH (ref 20.0–24.0)
TCO2: 26 mmol/L (ref 0–100)
pCO2 arterial: 40.9 mmHg (ref 35.0–45.0)
pH, Arterial: 7.389 (ref 7.350–7.450)
pO2, Arterial: 67 mmHg — ABNORMAL LOW (ref 80.0–100.0)

## 2012-06-28 LAB — COMPREHENSIVE METABOLIC PANEL
Albumin: 3.5 g/dL (ref 3.5–5.2)
BUN: 15 mg/dL (ref 6–23)
Calcium: 9.6 mg/dL (ref 8.4–10.5)
Creatinine, Ser: 1.19 mg/dL — ABNORMAL HIGH (ref 0.50–1.10)
GFR calc Af Amer: 54 mL/min — ABNORMAL LOW (ref >90)
Glucose, Bld: 84 mg/dL (ref 70–99)
Potassium: 3.9 mEq/L (ref 3.5–5.1)
Total Protein: 7.1 g/dL (ref 6.0–8.3)

## 2012-06-28 LAB — CBC WITH DIFFERENTIAL/PLATELET
Basophils Relative: 0 % (ref 0–1)
Eosinophils Absolute: 0 10*3/uL (ref 0.0–0.7)
Eosinophils Relative: 0 % (ref 0–5)
Hemoglobin: 13.1 g/dL (ref 12.0–15.0)
Lymphs Abs: 0.8 10*3/uL (ref 0.7–4.0)
MCH: 30.5 pg (ref 26.0–34.0)
MCHC: 33.1 g/dL (ref 30.0–36.0)
MCV: 92.3 fL (ref 78.0–100.0)
Monocytes Relative: 7 % (ref 3–12)
Neutrophils Relative %: 81 % — ABNORMAL HIGH (ref 43–77)
RBC: 4.29 MIL/uL (ref 3.87–5.11)

## 2012-06-28 MED ORDER — PREDNISONE 20 MG PO TABS
60.0000 mg | ORAL_TABLET | Freq: Once | ORAL | Status: AC
  Administered 2012-06-29: 60 mg via ORAL
  Filled 2012-06-28: qty 3

## 2012-06-28 MED ORDER — ALBUTEROL SULFATE (5 MG/ML) 0.5% IN NEBU
2.5000 mg | INHALATION_SOLUTION | Freq: Once | RESPIRATORY_TRACT | Status: AC
  Administered 2012-06-28: 2.5 mg via RESPIRATORY_TRACT
  Filled 2012-06-28: qty 0.5

## 2012-06-28 NOTE — ED Notes (Signed)
Patient arrived via [facility] from home with respiratory distress with sats in the 80's. EMS states upon their arrival patient had froth coming from her mouth. EMS administered albuterol Neb and atrovent Neb. Patient arrived with neb treatment going. She was recently here in the ED with a stroke. She is alert and oriented at this time. VSS.

## 2012-06-28 NOTE — ED Provider Notes (Addendum)
History     CSN: [id number]  Arrival date & time 06/28/12  2128   First MD Initiated Contact with Patient 06/28/12 2129      No chief complaint on file.   (Consider location/radiation/quality/duration/timing/severity/associated sxs/prior treatment) HPI Comments: 67 y.o. female who has pmh of COPD was recently discharged from the hospital after founding to have a stroke presents to the Er w/ the cc of shortness of breath. No fevers or chills. She states that today she had gradual worsening of breathing, and EMS was called. She does not have cough or sputum production. She denies chest pain, denies chest pressure.   Patient is a 67 y.o. female presenting with general illness. The history is provided by the patient.  Illness  The current episode started today. The onset was gradual. The problem occurs occasionally. The problem has been gradually improving. The problem is mild. Nothing relieves the symptoms. Nothing aggravates the symptoms. Associated symptoms include wheezing. Pertinent negatives include no fever, no eye itching, no abdominal pain, no diarrhea, no nausea, no vomiting, no headaches, no stridor, no neck pain, no cough, no rash, no eye discharge and no eye pain.  Illness Onset quality:  Gradual Timing:  Constant Progression:  Gradually improving Associated symptoms: shortness of breath and wheezing   Associated symptoms: no abdominal pain, no chest pain, no cough, no diarrhea, no fatigue, no fever, no headaches, no nausea, no rash and no vomiting     Past Medical History  Diagnosis Date  . Weight loss, unintentional   . Trouble swallowing   . Change in voice   . Substance abuse   . Dementia   . Asthma   . Shortness of breath   . Seizures     "    It has been along time "  . CHF (congestive heart failure)   . Headache   . Hypertension   . Arthralgia   . Tremors of nervous system   . Anxiety   . Rib fractures     hx of May 2012  . Heart attack 05/21/10  .  Schizophrenia   . Coronary artery disease   . Stroke 07/11/10    hx of R CVA   . COPD (chronic obstructive pulmonary disease)   . Obstructive sleep apnea   . 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 Total abdominal hysterectomy  age 21    Family History  Problem Relation Age of Onset  . Breast cancer Mother   . Birth defects Mother     breast  . Cancer Mother     breast  . Heart disease Father     heart attack  . Heart attack Father   . Heart attack Brother   . Cancer Brother     throat, lung  . Cancer Paternal Aunt   . Cancer Maternal Grandmother     breast     History  Substance Use Topics  . Smoking status: Former Smoker -- 1.00 packs/day for 50 years    Types: Cigarettes    Quit date: 02/22/2008  . Smokeless tobacco: Never Used     Comment: started smoking at age 10  . Alcohol Use: Yes     Comment: social use    OB History   Grav Para Term Preterm Abortions TAB SAB Ect Mult Living  Review of Systems  Constitutional: Negative for fever, chills and fatigue.  HENT: Negative for facial swelling, drooling, neck pain and dental problem.   Eyes: Negative for pain, discharge and itching.  Respiratory: Positive for shortness of breath and wheezing. Negative for cough, choking and stridor.   Cardiovascular: Negative for chest pain.  Gastrointestinal: Negative for nausea, vomiting, abdominal pain and diarrhea.  Endocrine: Negative for cold intolerance and heat intolerance.  Genitourinary: Negative for vaginal discharge, difficulty urinating and vaginal pain.  Skin: Negative for pallor and rash.  Neurological: Negative for dizziness, light-headedness and headaches.  Psychiatric/Behavioral: Negative for behavioral problems and agitation.    Allergies  Codeine; Penicillins; and Hydralazine  Home Medications   Current Outpatient Rx  Name  Route  Sig  Dispense   Refill  . albuterol (PROVENTIL) 90 MCG/ACT inhaler   Inhalation   Inhale 2 puffs into the lungs every 6 (six) hours as needed. For wheezing          . budesonide-formoterol (SYMBICORT) 80-4.5 MCG/ACT inhaler   Inhalation   Inhale 2 puffs into the lungs 2 (two) times daily.         . clopidogrel (PLAVIX) 75 MG tablet   Oral   Take 75 mg by mouth daily.           . famotidine (PEPCID) 20 MG tablet   Oral   Take 1 tablet (20 mg total) by mouth 2 (two) times daily.   60 tablet   3   . furosemide (LASIX) 40 MG tablet   Oral   Take 40 mg by mouth daily.         . nitroGLYCERIN (NITROSTAT) 0.4 MG SL tablet   Sublingual   Place 0.4 mg under the tongue every 5 (five) minutes as needed. For chest pain         . OLANZapine (ZYPREXA) 2.5 MG tablet   Oral   Take 1 tablet (2.5 mg total) by mouth at bedtime.         . potassium chloride (K-DUR,KLOR-CON) 10 MEQ tablet   Oral   Take 1 tablet (10 mEq total) by mouth daily.   30 tablet   3   . QUEtiapine (SEROQUEL) 50 MG tablet   Oral   Take 50 mg by mouth daily.         . simvastatin (ZOCOR) 20 MG tablet   Oral   Take 1 tablet (20 mg total) by mouth at bedtime.   30 tablet   3   . tiotropium (SPIRIVA) 18 MCG inhalation capsule   Inhalation   Place 1 capsule (18 mcg total) into inhaler and inhale daily.   30 capsule   3     There were no vitals taken for this visit.  Physical Exam  Constitutional: She is oriented to person, place, and time. She appears well-developed. No distress.  HENT:  Head: Normocephalic and atraumatic.  Eyes: Pupils are equal, round, and reactive to light. Right eye exhibits no discharge. Left eye exhibits no discharge.  Neck: Neck supple. No tracheal deviation present.  Cardiovascular: Normal rate.  Exam reveals no gallop and no friction rub.   Pulmonary/Chest: No stridor. No respiratory distress. She has wheezes.  Increased RR, wheezing   Abdominal: Soft. She exhibits no  distension. There is no tenderness. There is no rebound.  Musculoskeletal: She exhibits no edema and no tenderness.  Neurological: She is alert and oriented to person, place, and time.  Skin: Skin is warm. She is  not diaphoretic.    ED Course  Procedures (including critical care time)  Labs Reviewed  CBC WITH DIFFERENTIAL - Abnormal; Notable for the following:    Neutrophils Relative 81 (*)    Lymphocytes Relative 11 (*)    All other components within normal limits  COMPREHENSIVE METABOLIC PANEL - Abnormal; Notable for the following:    Creatinine, Ser 1.19 (*)    Total Bilirubin 0.2 (*)    GFR calc non Af Amer 46 (*)    GFR calc Af Amer 54 (*)    All other components within normal limits  POCT I-STAT 3, BLOOD GAS (G3+) - Abnormal; Notable for the following:    pO2, Arterial 67.0 (*)    Bicarbonate 24.7 (*)    All other components within normal limits  POCT I-STAT, CHEM 8 - Abnormal; Notable for the following:    Potassium 3.4 (*)    Creatinine, Ser 1.20 (*)    All other components within normal limits   Dg Chest 2 View  06/28/2012  *RADIOLOGY REPORT*  Clinical Data: 67 year old female respiratory distress.  CHEST - 2 VIEW  Comparison: 06/24/2012 and earlier.  Findings: Upright AP and lateral views of the chest. Stable cardiomegaly and mediastinal contours.  Stable lung volumes. Multilevel chronic left rib fractures.  No pneumothorax.  No pulmonary edema.  No pleural effusion.  Stable mildly asymmetric reticulonodular density at the right lung base since 2013.  No acute pulmonary opacity identified.  Osteopenia. No acute osseous abnormality identified.  Calcified atherosclerosis of the aorta.  IMPRESSION: Stable hyperinflation and cardiomegaly. No acute cardiopulmonary abnormality.   Original Report Authenticated By: Erskine Speed, M.D.       MDM    Suspect COPD exacerbation. Pt is able to ambulate with assistance (uses a walker at home), and her Sats remain >95%. She states she  wears 2L at home. Chest x-ray does not show pneumonia. She is given prednisone in the ER and d/c'ed with it. Given doxy for COPD exacerbation. Pt states she stay at home w/ her sister / niece and has home health to take care of her. Pt's Blood gas not concerning, no hypercarbic respiratory failure.   Pt is discharged with doxy and prednisone burst -- told to f/u with pcp within 2 days for re-evaluation.   1. COPD exacerbation            Bernadene Person, MD 06/29/12 1617  Bernadene Person, MD 07/10/12 1048

## 2012-06-29 LAB — POCT I-STAT, CHEM 8
Chloride: 105 mEq/L (ref 96–112)
Creatinine, Ser: 1.2 mg/dL — ABNORMAL HIGH (ref 0.50–1.10)
Glucose, Bld: 77 mg/dL (ref 70–99)
Hemoglobin: 14.3 g/dL (ref 12.0–15.0)
Potassium: 3.4 mEq/L — ABNORMAL LOW (ref 3.5–5.1)
Sodium: 144 mEq/L (ref 135–145)

## 2012-06-29 MED ORDER — PREDNISONE 20 MG PO TABS: 60.0000 mg | ORAL_TABLET | Freq: Every day | ORAL | Status: DC

## 2012-06-29 MED ORDER — DOXYCYCLINE HYCLATE 100 MG PO CAPS: 100.0000 mg | ORAL_CAPSULE | Freq: Two times a day (BID) | ORAL | Status: DC

## 2012-06-30 LAB — CULTURE, BLOOD (ROUTINE X 2): Culture: NO GROWTH

## 2012-06-30 NOTE — ED Provider Notes (Signed)
I saw and evaluated the patient, reviewed the resident's note and I agree with the findings and plan. Pt with SOB on arrival and hx of COPD.  Pt was possibly sating in the 80's  On EMS arrival however they felt that monitor was not picking up well.  Here after albutero/atrovent pt sating 100% and decreased breath sounds.  She is awake and alert.  She denies pain.  After 2nd neb states back to baseline.  Labs unrevealing and pt able to ambulate and maintain sats.  D/ced home.  Gwyneth Sprout, MD 06/30/12 [phone]

## 2012-07-12 NOTE — ED Provider Notes (Signed)
I saw and evaluated the patient, reviewed the resident's note and I agree with the findings and plan. Patient presented with shortness of breath by ambulance with a history of substance abuse and COPD. Patient wears 2 L of oxygen at home and on arrival had diffuse wheezing and work of breathing. After albuterol, Atrovent and steroids patient is much more comfortable. She is able to ambulate without difficulty and was discharged home if there is no sign of infection on her chest x-ray and ABG was non-concerning  Gwyneth Sprout, MD 07/12/12 [phone]

## 2012-08-30 ENCOUNTER — Ambulatory Visit: Payer: Self-pay | Admitting: Nurse Practitioner

## 2012-10-03 ENCOUNTER — Ambulatory Visit: Payer: Self-pay | Admitting: Nurse Practitioner

## 2012-11-24 ENCOUNTER — Inpatient Hospital Stay (HOSPITAL_COMMUNITY): Payer: Medicare Other

## 2012-11-24 ENCOUNTER — Emergency Department (HOSPITAL_COMMUNITY): Payer: Medicare Other

## 2012-11-24 ENCOUNTER — Inpatient Hospital Stay (HOSPITAL_COMMUNITY)
Admission: EM | Admit: 2012-11-24 | Discharge: 2012-11-29 | DRG: 061 | Disposition: A | Discharge status: Rehabilitation Facility | Payer: Medicare Other | Attending: Cardiology | Admitting: Cardiology

## 2012-11-24 ENCOUNTER — Encounter (HOSPITAL_COMMUNITY): Payer: Self-pay | Admitting: *Deleted

## 2012-11-24 DIAGNOSIS — R279 Unspecified lack of coordination: Secondary | ICD-10-CM | POA: Diagnosis present

## 2012-11-24 DIAGNOSIS — F209 Schizophrenia, unspecified: Secondary | ICD-10-CM | POA: Diagnosis present

## 2012-11-24 DIAGNOSIS — I428 Other cardiomyopathies: Secondary | ICD-10-CM | POA: Diagnosis present

## 2012-11-24 DIAGNOSIS — I5189 Other ill-defined heart diseases: Secondary | ICD-10-CM | POA: Diagnosis present

## 2012-11-24 DIAGNOSIS — R29810 Facial weakness: Secondary | ICD-10-CM | POA: Diagnosis present

## 2012-11-24 DIAGNOSIS — R64 Cachexia: Secondary | ICD-10-CM | POA: Diagnosis present

## 2012-11-24 DIAGNOSIS — I214 Non-ST elevation (NSTEMI) myocardial infarction: Secondary | ICD-10-CM | POA: Diagnosis present

## 2012-11-24 DIAGNOSIS — R7989 Other specified abnormal findings of blood chemistry: Secondary | ICD-10-CM

## 2012-11-24 DIAGNOSIS — Z23 Encounter for immunization: Secondary | ICD-10-CM

## 2012-11-24 DIAGNOSIS — I252 Old myocardial infarction: Secondary | ICD-10-CM

## 2012-11-24 DIAGNOSIS — Z91199 Patient's noncompliance with other medical treatment and regimen due to unspecified reason: Secondary | ICD-10-CM

## 2012-11-24 DIAGNOSIS — E7800 Pure hypercholesterolemia, unspecified: Secondary | ICD-10-CM | POA: Diagnosis present

## 2012-11-24 DIAGNOSIS — I4460 Unspecified fascicular block: Secondary | ICD-10-CM | POA: Diagnosis present

## 2012-11-24 DIAGNOSIS — Z853 Personal history of malignant neoplasm of breast: Secondary | ICD-10-CM

## 2012-11-24 DIAGNOSIS — R471 Dysarthria and anarthria: Secondary | ICD-10-CM | POA: Diagnosis present

## 2012-11-24 DIAGNOSIS — I639 Cerebral infarction, unspecified: Secondary | ICD-10-CM

## 2012-11-24 DIAGNOSIS — I509 Heart failure, unspecified: Secondary | ICD-10-CM | POA: Diagnosis present

## 2012-11-24 DIAGNOSIS — Z87891 Personal history of nicotine dependence: Secondary | ICD-10-CM

## 2012-11-24 DIAGNOSIS — Z9119 Patient's noncompliance with other medical treatment and regimen: Secondary | ICD-10-CM

## 2012-11-24 DIAGNOSIS — J4489 Other specified chronic obstructive pulmonary disease: Secondary | ICD-10-CM | POA: Diagnosis present

## 2012-11-24 DIAGNOSIS — Z8673 Personal history of transient ischemic attack (TIA), and cerebral infarction without residual deficits: Secondary | ICD-10-CM

## 2012-11-24 DIAGNOSIS — F0390 Unspecified dementia without behavioral disturbance: Secondary | ICD-10-CM | POA: Diagnosis present

## 2012-11-24 DIAGNOSIS — Z681 Body mass index (BMI) 19 or less, adult: Secondary | ICD-10-CM

## 2012-11-24 DIAGNOSIS — I6350 Cerebral infarction due to unspecified occlusion or stenosis of unspecified cerebral artery: Secondary | ICD-10-CM

## 2012-11-24 DIAGNOSIS — G4733 Obstructive sleep apnea (adult) (pediatric): Secondary | ICD-10-CM | POA: Diagnosis present

## 2012-11-24 DIAGNOSIS — E43 Unspecified severe protein-calorie malnutrition: Secondary | ICD-10-CM | POA: Diagnosis present

## 2012-11-24 DIAGNOSIS — I10 Essential (primary) hypertension: Secondary | ICD-10-CM

## 2012-11-24 DIAGNOSIS — J449 Chronic obstructive pulmonary disease, unspecified: Secondary | ICD-10-CM | POA: Diagnosis present

## 2012-11-24 DIAGNOSIS — I6340 Cerebral infarction due to embolism of unspecified cerebral artery: Principal | ICD-10-CM | POA: Diagnosis present

## 2012-11-24 DIAGNOSIS — I2510 Atherosclerotic heart disease of native coronary artery without angina pectoris: Secondary | ICD-10-CM | POA: Diagnosis present

## 2012-11-24 DIAGNOSIS — F1410 Cocaine abuse, uncomplicated: Secondary | ICD-10-CM | POA: Diagnosis present

## 2012-11-24 DIAGNOSIS — F411 Generalized anxiety disorder: Secondary | ICD-10-CM | POA: Diagnosis present

## 2012-11-24 LAB — URINALYSIS, ROUTINE W REFLEX MICROSCOPIC
Bilirubin Urine: NEGATIVE
Glucose, UA: NEGATIVE mg/dL
Hgb urine dipstick: NEGATIVE
Ketones, ur: 15 mg/dL — AB
Nitrite: NEGATIVE
Protein, ur: 30 mg/dL — AB
Specific Gravity, Urine: 1.02 (ref 1.005–1.030)
Urobilinogen, UA: 1 mg/dL (ref 0.0–1.0)

## 2012-11-24 LAB — COMPREHENSIVE METABOLIC PANEL
ALT: 8 U/L (ref 0–35)
AST: 34 U/L (ref 0–37)
Albumin: 3.9 g/dL (ref 3.5–5.2)
Alkaline Phosphatase: 93 U/L (ref 39–117)
BUN: 20 mg/dL (ref 6–23)
CO2: 25 mEq/L (ref 19–32)
Chloride: 103 mEq/L (ref 96–112)
Creatinine, Ser: 1.17 mg/dL — ABNORMAL HIGH (ref 0.50–1.10)
GFR calc non Af Amer: 47 mL/min — ABNORMAL LOW (ref >90)
Potassium: 3.4 mEq/L — ABNORMAL LOW (ref 3.5–5.1)
Total Bilirubin: 0.6 mg/dL (ref 0.3–1.2)

## 2012-11-24 LAB — POCT I-STAT TROPONIN I: Troponin i, poc: 6 ng/mL (ref 0.00–0.08)

## 2012-11-24 LAB — PROTIME-INR: INR: 1.02 (ref 0.00–1.49)

## 2012-11-24 LAB — APTT: aPTT: 33 seconds (ref 24–37)

## 2012-11-24 LAB — URINE MICROSCOPIC-ADD ON

## 2012-11-24 LAB — POCT I-STAT, CHEM 8
BUN: 20 mg/dL (ref 6–23)
Chloride: 107 mEq/L (ref 96–112)
Creatinine, Ser: 1.3 mg/dL — ABNORMAL HIGH (ref 0.50–1.10)
HCT: 49 % — ABNORMAL HIGH (ref 36.0–46.0)
Hemoglobin: 16.7 g/dL — ABNORMAL HIGH (ref 12.0–15.0)
Potassium: 3.4 mEq/L — ABNORMAL LOW (ref 3.5–5.1)
Sodium: 145 mEq/L (ref 135–145)
TCO2: 26 mmol/L (ref 0–100)

## 2012-11-24 LAB — CBC
HCT: 45.2 % (ref 36.0–46.0)
Hemoglobin: 15.1 g/dL — ABNORMAL HIGH (ref 12.0–15.0)
RBC: 4.94 MIL/uL (ref 3.87–5.11)
WBC: 4.7 10*3/uL (ref 4.0–10.5)

## 2012-11-24 LAB — DIFFERENTIAL
Basophils Absolute: 0 10*3/uL (ref 0.0–0.1)
Basophils Relative: 1 % (ref 0–1)
Lymphocytes Relative: 53 % — ABNORMAL HIGH (ref 12–46)
Lymphs Abs: 2.5 10*3/uL (ref 0.7–4.0)
Monocytes Absolute: 0.5 10*3/uL (ref 0.1–1.0)
Monocytes Relative: 10 % (ref 3–12)
Neutro Abs: 1.6 10*3/uL — ABNORMAL LOW (ref 1.7–7.7)
Neutrophils Relative %: 35 % — ABNORMAL LOW (ref 43–77)

## 2012-11-24 LAB — RAPID URINE DRUG SCREEN, HOSP PERFORMED
Amphetamines: NOT DETECTED
Barbiturates: NOT DETECTED
Benzodiazepines: NOT DETECTED
Opiates: NOT DETECTED
Tetrahydrocannabinol: NOT DETECTED

## 2012-11-24 LAB — GLUCOSE, CAPILLARY: Glucose-Capillary: 145 mg/dL — ABNORMAL HIGH (ref 70–99)

## 2012-11-24 LAB — ETHANOL: Alcohol, Ethyl (B): <11 mg/dL (ref 0–11)

## 2012-11-24 LAB — TROPONIN I: Troponin I: 2.88 ng/mL (ref <0.30)

## 2012-11-24 MED ORDER — PANTOPRAZOLE SODIUM 40 MG IV SOLR
40.0000 mg | Freq: Every day | INTRAVENOUS | Status: DC
  Administered 2012-11-24 – 2012-11-25 (×2): 40 mg via INTRAVENOUS
  Filled 2012-11-24 (×3): qty 40

## 2012-11-24 MED ORDER — PNEUMOCOCCAL VAC POLYVALENT 25 MCG/0.5ML IJ INJ
0.5000 mL | INJECTION | INTRAMUSCULAR | Status: AC
  Administered 2012-11-25: 0.5 mL via INTRAMUSCULAR
  Filled 2012-11-24: qty 0.5

## 2012-11-24 MED ORDER — POTASSIUM CHLORIDE 10 MEQ/100ML IV SOLN: 10.0000 meq | Freq: Once | INTRAVENOUS | Status: DC

## 2012-11-24 MED ORDER — SODIUM CHLORIDE 0.9 % IV SOLN
INTRAVENOUS | Status: DC
  Administered 2012-11-24 – 2012-11-26 (×3): via INTRAVENOUS

## 2012-11-24 MED ORDER — ACETAMINOPHEN 650 MG RE SUPP: 650.0000 mg | RECTAL | Status: DC | PRN

## 2012-11-24 MED ORDER — ACETAMINOPHEN 325 MG PO TABS
650.0000 mg | ORAL_TABLET | ORAL | Status: DC | PRN
  Administered 2012-11-25 – 2012-11-27 (×3): 650 mg via ORAL
  Filled 2012-11-24 (×3): qty 2

## 2012-11-24 MED ORDER — STROKE: EARLY STAGES OF RECOVERY BOOK
Freq: Once | Status: AC
  Administered 2012-11-24: 23:00:00
  Filled 2012-11-24: qty 1

## 2012-11-24 MED ORDER — ALTEPLASE (STROKE) FULL DOSE INFUSION
0.9000 mg/kg | Freq: Once | INTRAVENOUS | Status: AC
  Administered 2012-11-24: 36 mg via INTRAVENOUS
  Filled 2012-11-24: qty 36

## 2012-11-24 MED ORDER — LABETALOL HCL 5 MG/ML IV SOLN: 10.0000 mg | INTRAVENOUS | Status: DC | PRN

## 2012-11-24 NOTE — Code Documentation (Signed)
Code stroke called at 1710, Patient arrived to [facility] ED via EMS at 1721, EDP seen at 1721, stroke team arrived at 1718, lab arrived at 1715, Patient in CT scan at 83.  Patient to MRI at 1809.  LSn at 1645, As per EMS patient was with caregiver when she developed slurred speech and a facial droop.  No family available to be reached to verify.  NIHSS 9

## 2012-11-24 NOTE — ED Notes (Addendum)
Caregiver reports sudden onset slurred speech & facial droop at 1645. No extremity weakness noted. Pt has had multiple CVA's in past

## 2012-11-24 NOTE — ED Notes (Signed)
Returned from Ct to room Trauma C.  Dr. Amada Jupiter & Stroke Team nurse at bedside

## 2012-11-24 NOTE — ED Notes (Signed)
Pt uncooperative & will follow commands & answer questions intermittently. Speech very slurred, almost incomprehensible at times. Pt admits to not taking any of her meds "for a long time".  C/o right side h/a upon arrival to room.

## 2012-11-24 NOTE — ED Notes (Signed)
Pt returns from MRI ° °

## 2012-11-24 NOTE — ED Notes (Signed)
Patient transported to MRI with Stroke team nurse.

## 2012-11-24 NOTE — H&P (Addendum)
Neurology Consultation Reason for Consult: Concern for stroke Referring Physician: Redgie Grayer, D  CC: Slurred Speech  History is obtained from:EMS  HPI: CHAMPAYNE [name] is a 67 y.o. female who was apparently eating a english muffin earlier when she became suddenly dysarthric. It is unclear if she had any other changes earlier. She has had multiple strokes in the past and volunteers that she is not taking any medications.   Her history was very unclear initially and therefore she was taken to MRI which confirms two recent infarcts. She does endorse vision changes, and says that they started at the same time as her other symptoms. She currently is very dysarthric, and very ataxic on the left.  Initially, time of onset was very unclear and I was unable to get a hold of family. Finally after repeated attempts I was able to get a hold her niece that Saw her onset and was able to confirm time of onset. Due to inability initially to confirm time of onset or status prior to event, there was a delay prior to initiating tpa, but it was given within the three hour window. .   LKW: 4:30 PM tpa given: Yes  ROS: A 14 point ROS was performed and is negative except as noted in the HPI.  Past Medical History  Diagnosis Date  . Weight loss, unintentional   . Trouble swallowing   . Change in voice   . Substance abuse   . Dementia   . Asthma   . Shortness of breath   . Seizures     "    It has been along time "  . CHF (congestive heart failure)   . Headache(784.0)   . Hypertension   . Arthralgia   . Tremors of nervous system   . Anxiety   . Rib fractures     hx of May 2012  . Heart attack 05/21/10  . Schizophrenia   . Coronary artery disease   . Stroke 07/11/10    hx of R CVA   . COPD (chronic obstructive pulmonary disease)   . Obstructive sleep apnea   . 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  CV: RRR Abd: Nontender nondistended Respiratory: Clear to auscultation Extremities: No edema Mental Status: Patient is awake, alert, she is extremely dysarthric, but follows commands well. She is able to answer some questions, but typically insists on speaking which is very difficult to understand. She is able to name some objects. Cranial Nerves: II: Visual Fields are difficult to assess, but she does fixate and track, and does occasionally blink to threat from the left. Pupils are equal, round, and reactive to light.  Discs are difficult to visualize. III,IV, VI: EOMI with mildly disconjugate gaze at times , though then she is able to conjugate. V: Endorses sensation bilaterally VII: Facial movement is notable for right droop VIII: hearing is intact to voice X: Does not comply XI: Does not comply XII: tongue is midline without  atrophy or fasciculations.  Motor: Tone is normal. Bulk is normal. 5/5 strength was present on the right side, on the left she does have some leg weakness 4/5, as well as mild arm weakness 4+/5.  Sensory: Sensation is symmetric to light touch  in the arms and legs. Cerebellar: She is grossly ataxic in the left arm and leg, she is mild difficulty with finger-nose-finger on the right Gait: Not assessed due to acute nature of evaluation and multiple medical monitors in ED setting.  I have reviewed labs in epic and the results pertinent to this consultation are: Elevated troponin at 2.8 Elevated glucose at 145 CMP mildly low potassium   I have reviewed the images obtained: CT  head-negative MRI brain-acute infarcts in the cerebellum and PCA territory  Impression: 67 year old female with acute onset visual change and dysarthria and ataxia in the setting of multiple previous strokes. She is noncompliant with medications. She is receiving TPA for the posterior circulation strokes. The risks of TPA were discussed with her sister and the decision was made to go ahead with TPA.  Recommendations: 1. HgbA1c, fasting lipid panel 2. MRI, MRA  of the brain without contrast 3. Frequent neuro checks 4. Echocardiogram 5. Carotid dopplers are not needed given recent study, as well as posterior circulation infarcts. 6. Prophylactic therapy-None, TPA 7. Risk factor modification 8. Telemetry monitoring 9. PT consult, OT consult, Speech consult 10. Cardiology consult for elevated troponin  This patient is critically ill and at significant risk of neurological worsening, death and care requires constant monitoring of vital signs, hemodynamics,respiratory and cardiac monitoring, neurological assessment, discussion with family, other specialists and medical decision making of high complexity. I spent 90 minutes of neurocritical care time  in the care of  this patient.  Ritta Slot, MD [facility] [phone]  If 7pm- 7am, please page neurology on call at [phone].  11/24/2012  7:58 PM

## 2012-11-24 NOTE — Code Documentation (Signed)
Code stroke continuation: Pharmacy notified to mix tPA at 1856, tPA delivered to bedside at 1910. Full dose tPA started at 1914, ICU bed requested at 1947

## 2012-11-24 NOTE — ED Provider Notes (Signed)
CSN: [id number]     Arrival date & time 11/24/12  1721 History   First MD Initiated Contact with Patient 11/24/12 1726     Chief Complaint  Patient presents with  . Code Stroke   (Consider location/radiation/quality/duration/timing/severity/associated sxs/prior Treatment) Patient is a 67 y.o. female presenting with Acute Neurological Problem. The history is provided by the patient and the EMS personnel. The history is limited by the condition of the patient (dysarthria and slurred speech).  Cerebrovascular Accident This is a new problem. The current episode started today. The problem occurs constantly. The problem has been unchanged. Associated symptoms comments: Dysarthria and right sided facial droop. Nothing aggravates the symptoms. She has tried nothing for the symptoms. The treatment provided no relief.    Past Medical History  Diagnosis Date  . Weight loss, unintentional   . Trouble swallowing   . Change in voice   . Substance abuse   . Dementia   . Asthma   . Shortness of breath   . Seizures     "    It has been along time "  . CHF (congestive heart failure)   . Headache(784.0)   . Hypertension   . Arthralgia   . Tremors of nervous system   . Anxiety   . Rib fractures     hx of May 2012  . Heart attack 05/21/10  . Schizophrenia   . Coronary artery disease   . Stroke 07/11/10    hx of R CVA   . COPD (chronic obstructive pulmonary disease)   . Obstructive sleep apnea   . 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 Total abdominal hysterectomy  age 33   Family History  Problem Relation Age of Onset  . Breast cancer Mother   . Birth defects Mother     breast  . Cancer Mother     breast  . Heart disease Father     heart attack  . Heart attack Father   . Heart attack Brother   . Cancer Brother     throat, lung  . Cancer Paternal Aunt   . Cancer Maternal Grandmother     breast     History  Substance Use Topics  . Smoking status: Former Smoker -- 1.00 packs/day for 50 years    Types: Cigarettes    Quit date: 02/22/2008  . Smokeless tobacco: Never Used     Comment: started smoking at age 81  . Alcohol Use: Yes     Comment: social use   OB History   Grav Para Term Preterm Abortions TAB SAB Ect Mult Living                 Review of Systems  Unable to perform ROS: Mental status change    Allergies  Codeine; Penicillins; and Hydralazine  Home Medications   No current outpatient prescriptions on file. BP 133/64  Pulse 44  Temp(Src) 97.4 F (36.3 C) (Oral)  Resp 17  Ht 5\' 1"  (1.549 m)  Wt 88 lb 2.9 oz (40 kg)  BMI 16.67 kg/m2  SpO2 99% Physical Exam  Nursing note and vitals reviewed. Constitutional: She appears well-developed. She appears distressed.  Slurred speech with appropriate but somewhat difficult to understand  HENT:  Head: Normocephalic and atraumatic.  Right-sided facial droop  Eyes: Conjunctivae and EOM are normal. Pupils are equal, round, and reactive to light.  Neck: Normal range of motion. Neck supple.  Cardiovascular: Normal rate, regular rhythm and normal heart sounds.  Exam reveals no gallop and no friction rub.   No murmur heard. Pulmonary/Chest: Effort normal and breath sounds normal. No respiratory distress. She has no wheezes. She has no rales.  Abdominal: Bowel sounds are normal. She exhibits no distension. There is no tenderness. There is no rebound.  Neurological: She is alert. A cranial nerve deficit (right sided facial droop) is present. No sensory deficit. Coordination (difficulty due to left sided weakness) and gait abnormal. GCS eye subscore is 4. GCS verbal subscore is 5. GCS motor subscore is 6.  Reflex Scores:      Bicep reflexes are 2+ on the right side and 2+ on the left side.      Patellar reflexes are 2+ on the right side and 2+ on the left side. Left leg and arm 4/5 strength. RUE and RLE 5/5. Difficulty with  finger-nose-finger on left.  Skin: Skin is warm and dry. No rash noted. She is not diaphoretic.  Psychiatric: Her mood appears anxious. Her speech is slurred. She is slowed.    ED Course  Procedures (including critical care time) Labs Review Labs Reviewed  CBC - Abnormal; Notable for the following:    Hemoglobin 15.1 (*)    All other components within normal limits  DIFFERENTIAL - Abnormal; Notable for the following:    Neutrophils Relative % 35 (*)    Neutro Abs 1.6 (*)    Lymphocytes Relative 53 (*)    All other components within normal limits  COMPREHENSIVE METABOLIC PANEL - Abnormal; Notable for the following:    Potassium 3.4 (*)    Glucose, Bld 145 (*)    Creatinine, Ser 1.17 (*)    GFR calc non Af Amer 47 (*)    GFR calc Af Amer 55 (*)    All other components within normal limits  TROPONIN I - Abnormal; Notable for the following:    Troponin I 2.88 (*)    All other components within normal limits  URINALYSIS, ROUTINE W REFLEX MICROSCOPIC - Abnormal; Notable for the following:    APPearance CLOUDY (*)    Ketones, ur 15 (*)    Protein, ur 30 (*)    Leukocytes, UA SMALL (*)    All other components within normal limits  GLUCOSE, CAPILLARY - Abnormal; Notable for the following:    Glucose-Capillary 145 (*)    All other components within normal limits  TROPONIN I - Abnormal; Notable for the following:    Troponin I 2.53 (*)    All other components within normal limits  URINE MICROSCOPIC-ADD ON - Abnormal; Notable for the following:    Squamous Epithelial / LPF FEW (*)    Bacteria, UA FEW (*)    Casts HYALINE CASTS (*)    All other components within normal limits  POCT I-STAT, CHEM 8 - Abnormal; Notable for the following:    Potassium 3.4 (*)    Creatinine, Ser 1.30 (*)    Glucose, Bld 149 (*)    Hemoglobin 16.7 (*)    HCT 49.0 (*)    All other components within normal limits  POCT I-STAT TROPONIN I - Abnormal; Notable for the following:    Troponin i, poc 6.00 (*)     All other components within normal limits  MRSA PCR SCREENING  URINE CULTURE  ETHANOL  PROTIME-INR  APTT  URINE RAPID DRUG SCREEN (HOSP PERFORMED)  GLUCOSE, CAPILLARY  HEMOGLOBIN A1C  LIPID PANEL   Imaging Review Ct Head (brain) Wo Contrast  11/24/2012   CLINICAL DATA:  Could stroke, new onset slurred speech. Multiple previous cerebral infarctions.  EXAM: CT HEAD WITHOUT CONTRAST  TECHNIQUE: Contiguous axial images were obtained from the base of the skull through the vertex without intravenous contrast.  COMPARISON:  Head CT 06/24/2012  FINDINGS: There are multiple cortical infarctions unchanged from prior CT including the medial right frontal lobe, right parietal lobe, left temporal operculum, and medial left occipital lobe.  No CT evidence of acute cortical infarction. No intracranial hemorrhage. There is ventricular dilatation associated with the remote infarctions. There is periventricular and deep white matter hypodensities unchanged also.  Paranasal sinuses and mastoid air cells are clear.  IMPRESSION: 1. No evidence of acute cortical infarction.  2. No evidence of intracranial hemorrhage.  3. Multiple remote cortical hemispheric infarctions are again demonstrated.  4.  Atrophy and chronic microvascular disease again demonstrated.  Findings conveyed to Dr. Amada Jupiter on 11/24/2012 at17:42.   Electronically Signed   By: Genevive Bi M.D.   On: 11/24/2012 17:44   Mr Brain Wo Contrast  11/24/2012   CLINICAL DATA:  Code stroke.  Altered mental status. Dementia.  EXAM: MRI HEAD WITHOUT CONTRAST  TECHNIQUE: Multiplanar, multisequence MR imaging was performed. No intravenous contrast was administered.  COMPARISON:  CT head earlier in the day.  FINDINGS: The patient was only able to undergo diffusion imaging and a single FLAIR sequence, beyond which she could not remain motionless. Important information was obtained however.  There is evidence for an acute left superior cerebellar infarct  affecting much of the hemisphere and also a portion of the superior vermis. There is no significant mass effect on the 4th ventricle and no visible hemorrhage.  In addition, there is a 2nd, acute right posterior temporal infarction affecting the cortex and subcortical white matter. This infarction is approximately 2 cm cross-section. Again no visible in areas of hemorrhage.  Extensive encephalomalacia related to remote bihemispheric infarctions. Advanced premature atrophy and chronic microvascular ischemic change.  IMPRESSION: Acute infarction affects the left cerebellum and right posterior temporal regions. See discussion above.   Electronically Signed   By: Davonna Belling M.D.   On: 11/24/2012 18:47   Dg Chest Port 1 View  11/24/2012   CLINICAL DATA:  Elevated troponins  EXAM: PORTABLE CHEST - 1 VIEW  COMPARISON:  06/28/2012  FINDINGS: The heart size and mediastinal contours are within normal limits. Both lungs are clear. The visualized skeletal structures show old rib fractures on the left.  IMPRESSION: No active disease.   Electronically Signed   By: Alcide Clever M.D.   On: 11/24/2012 20:17    MDM   1. Stroke   2. Acute ischemic stroke   3. Congestive heart failure, unspecified   4. Hypertension   5. NSTEMI (non-ST elevated myocardial infarction)     RHYLIN VENTERS arrived as a code stroke, last seen normal at 14:45 and then she had acute onset of right sided facial droop and dysarthria as well as difficulty with ambulation. She has had two previous strokes, but is noted to not have any residual deficits. Additionally, past medical history is significant for HTN, CAD, mild dementia.  On arrival, patient is dysarthric with noticeable facial droop. Taken to CT scanner with neurology and code stroke labs drawn. CT scan showed no acute bleed and patient was felt to be a TPA candidate. She was taken to MRI with Neurology and then given TPA  following consent by Neurology after she was found to not  have contra-indication. Neuro to admit to Neuro ICU following TPA administration.   Date: 11/25/2012  Rate: 80  Rhythm: normal sinus rhythm  QRS Axis: normal  Intervals: normal  ST/T Wave abnormalities: nonspecific ST changes  Conduction Disutrbances:left bundle branch block  Narrative Interpretation:   Old EKG Reviewed: unchanged  Labs returned showing elevated troponin at 2.5. EKG is unchanged from previous the patient has no chest pain. Presentation more consistent with stroke and not necessarily consistent with ACS, however neurology will consult cardiology who will see the patient is an inpatient to evaluate for possible NSTEMI and treat accordingly. Remainder of workup unremarkable. Patient admitted to the neuro ICU.  Patient was discussed with my attending, Dr. Redgie Grayer.  Dorna Leitz, MD 11/25/12 [phone]

## 2012-11-24 NOTE — Consult Note (Signed)
Reason for Consult: Elevated troponin Referring Physician: [name] is an 67 y.o. female.  HPI: [name] presents today with a chief complain of stroke. 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 She did have a coronary angiogram back in 2010 which showed no significant coronary artery disease. She presents today with dysarthria. MRI of the brain confirmed new acute ischemic strokes. She presented in a timely fashion to the ED, so she is now receiving thrombolytics. Her troponin was elevated on arrival and has continued to elevate while in the ED. She denies any chest pain or pressure at this time. She thinks that she had been doing well prior to today's stroke. She denies any SOB as well. Her ECG shows an incomplete LBBB which is unchanged from prior.   Past Medical History  Diagnosis Date  . Weight loss, unintentional   . Trouble swallowing   . Change in voice   . Substance abuse   . Dementia   . Asthma   . Shortness of breath   . Seizures     "    It has been along time "  . CHF (congestive heart failure)   . Headache(784.0)   . Hypertension   . Arthralgia   . Tremors of nervous system   . Anxiety   . Rib fractures     hx of May 2012  . Heart attack 05/21/10  . Schizophrenia   . Coronary artery disease   . Stroke 07/11/10    hx of R CVA   . COPD (chronic obstructive pulmonary disease)   . Obstructive sleep apnea   . 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 Total abdominal hysterectomy  age 67    Family History  Problem Relation Age of Onset  . Breast cancer Mother   . Birth defects Mother     breast  . Cancer Mother     breast  . Heart disease Father     heart attack  . Heart attack Father   . Heart attack Brother   . Cancer Brother     throat, lung  . Cancer  Paternal Aunt   . Cancer Maternal Grandmother     breast     Social History:  reports that she quit smoking about 4 years ago. Her smoking use included Cigarettes. She has a 50 pack-year smoking history. She has never used smokeless tobacco. She reports that  drinks alcohol. She reports that she uses illicit drugs.  Allergies:  Allergies  Allergen Reactions  . Codeine Itching    All over the body  . Penicillins Hives and Other (See Comments)    "Whelps" per patient.  . Hydralazine     02/28/11 Family unsure if this is allergy for pt    Medications: I have reviewed the patient's current medications.  Results for orders placed during the hospital encounter of 11/24/12 (from the past 48 hour(s))  ETHANOL     Status: None   Collection Time    11/24/12  5:29 PM      Result Value Range   Alcohol, Ethyl (B) <11  0 - 11 mg/dL   Comment:            LOWEST DETECTABLE LIMIT FOR     SERUM ALCOHOL IS 11 mg/dL  FOR MEDICAL PURPOSES ONLY  PROTIME-INR     Status: None   Collection Time    11/24/12  5:29 PM      Result Value Range   Prothrombin Time 13.2  11.6 - 15.2 seconds   INR 1.02  0.00 - 1.49  APTT     Status: None   Collection Time    11/24/12  5:29 PM      Result Value Range   aPTT 33  24 - 37 seconds  CBC     Status: Abnormal   Collection Time    11/24/12  5:29 PM      Result Value Range   WBC 4.7  4.0 - 10.5 K/uL   RBC 4.94  3.87 - 5.11 MIL/uL   Hemoglobin 15.1 (*) 12.0 - 15.0 g/dL   HCT 09.8  11.9 - 14.7 %   MCV 91.5  78.0 - 100.0 fL   MCH 30.6  26.0 - 34.0 pg   MCHC 33.4  30.0 - 36.0 g/dL   RDW 82.9  56.2 - 13.0 %   Platelets 233  150 - 400 K/uL  DIFFERENTIAL     Status: Abnormal   Collection Time    11/24/12  5:29 PM      Result Value Range   Neutrophils Relative % 35 (*) 43 - 77 %   Neutro Abs 1.6 (*) 1.7 - 7.7 K/uL   Lymphocytes Relative 53 (*) 12 - 46 %   Lymphs Abs 2.5  0.7 - 4.0 K/uL   Monocytes Relative 10  3 - 12 %   Monocytes Absolute 0.5  0.1 - 1.0  K/uL   Eosinophils Relative 2  0 - 5 %   Eosinophils Absolute 0.1  0.0 - 0.7 K/uL   Basophils Relative 1  0 - 1 %   Basophils Absolute 0.0  0.0 - 0.1 K/uL  COMPREHENSIVE METABOLIC PANEL     Status: Abnormal   Collection Time    11/24/12  5:29 PM      Result Value Range   Sodium 144  135 - 145 mEq/L   Potassium 3.4 (*) 3.5 - 5.1 mEq/L   Chloride 103  96 - 112 mEq/L   CO2 25  19 - 32 mEq/L   Glucose, Bld 145 (*) 70 - 99 mg/dL   BUN 20  6 - 23 mg/dL   Creatinine, Ser 8.65 (*) 0.50 - 1.10 mg/dL   Calcium 9.8  8.4 - 78.4 mg/dL   Total Protein 8.1  6.0 - 8.3 g/dL   Albumin 3.9  3.5 - 5.2 g/dL   AST 34  0 - 37 U/L   ALT 8  0 - 35 U/L   Alkaline Phosphatase 93  39 - 117 U/L   Total Bilirubin 0.6  0.3 - 1.2 mg/dL   GFR calc non Af Amer 47 (*) >90 mL/min   GFR calc Af Amer 55 (*) >90 mL/min   Comment: (NOTE)     The eGFR has been calculated using the CKD EPI equation.     This calculation has not been validated in all clinical situations.     eGFR's persistently <90 mL/min signify possible Chronic Kidney     Disease.  TROPONIN I     Status: Abnormal   Collection Time    11/24/12  5:29 PM      Result Value Range   Troponin I 2.88 (*) <0.30 ng/mL   Comment:  Due to the release kinetics of cTnI,     a negative result within the first hours     of the onset of symptoms does not rule out     myocardial infarction with certainty.     If myocardial infarction is still suspected,     repeat the test at appropriate intervals.     REPEATED TO VERIFY     CRITICAL RESULT CALLED TO, READ BACK BY AND VERIFIED WITH:     YOUNG,M RN 11/24/12 1828 WOOTEN,K  POCT I-STAT TROPONIN I     Status: Abnormal   Collection Time    11/24/12  5:35 PM      Result Value Range   Troponin i, poc 6.00 (*) 0.00 - 0.08 ng/mL   Comment NOTIFIED PHYSICIAN     Comment 3            Comment: Due to the release kinetics of cTnI,     a negative result within the first hours     of the onset of symptoms  does not rule out     myocardial infarction with certainty.     If myocardial infarction is still suspected,     repeat the test at appropriate intervals.  POCT I-STAT, CHEM 8     Status: Abnormal   Collection Time    11/24/12  5:37 PM      Result Value Range   Sodium 145  135 - 145 mEq/L   Potassium 3.4 (*) 3.5 - 5.1 mEq/L   Chloride 107  96 - 112 mEq/L   BUN 20  6 - 23 mg/dL   Creatinine, Ser 0.98 (*) 0.50 - 1.10 mg/dL   Glucose, Bld 119 (*) 70 - 99 mg/dL   Calcium, Ion 1.47  8.29 - 1.30 mmol/L   TCO2 26  0 - 100 mmol/L   Hemoglobin 16.7 (*) 12.0 - 15.0 g/dL   HCT 56.2 (*) 13.0 - 86.5 %  GLUCOSE, CAPILLARY     Status: Abnormal   Collection Time    11/24/12  5:41 PM      Result Value Range   Glucose-Capillary 145 (*) 70 - 99 mg/dL    Ct Head (brain) Wo Contrast  11/24/2012   CLINICAL DATA:  Could stroke, new onset slurred speech. Multiple previous cerebral infarctions.  EXAM: CT HEAD WITHOUT CONTRAST  TECHNIQUE: Contiguous axial images were obtained from the base of the skull through the vertex without intravenous contrast.  COMPARISON:  Head CT 06/24/2012  FINDINGS: There are multiple cortical infarctions unchanged from prior CT including the medial right frontal lobe, right parietal lobe, left temporal operculum, and medial left occipital lobe.  No CT evidence of acute cortical infarction. No intracranial hemorrhage. There is ventricular dilatation associated with the remote infarctions. There is periventricular and deep white matter hypodensities unchanged also.  Paranasal sinuses and mastoid air cells are clear.  IMPRESSION: 1. No evidence of acute cortical infarction.  2. No evidence of intracranial hemorrhage.  3. Multiple remote cortical hemispheric infarctions are again demonstrated.  4.  Atrophy and chronic microvascular disease again demonstrated.  Findings conveyed to Dr. Amada Jupiter on 11/24/2012 at17:42.   Electronically Signed   By: Genevive Bi M.D.   On: 11/24/2012 17:44    Mr Brain Wo Contrast  11/24/2012   CLINICAL DATA:  Code stroke.  Altered mental status. Dementia.  EXAM: MRI HEAD WITHOUT CONTRAST  TECHNIQUE: Multiplanar, multisequence MR imaging was performed. No intravenous contrast was administered.  COMPARISON:  CT head earlier in the day.  FINDINGS: The patient was only able to undergo diffusion imaging and a single FLAIR sequence, beyond which she could not remain motionless. Important information was obtained however.  There is evidence for an acute left superior cerebellar infarct affecting much of the hemisphere and also a portion of the superior vermis. There is no significant mass effect on the 4th ventricle and no visible hemorrhage.  In addition, there is a 2nd, acute right posterior temporal infarction affecting the cortex and subcortical white matter. This infarction is approximately 2 cm cross-section. Again no visible in areas of hemorrhage.  Extensive encephalomalacia related to remote bihemispheric infarctions. Advanced premature atrophy and chronic microvascular ischemic change.  IMPRESSION: Acute infarction affects the left cerebellum and right posterior temporal regions. See discussion above.   Electronically Signed   By: Davonna Belling M.D.   On: 11/24/2012 18:47    Review of Systems  Constitutional: Negative.   Eyes: Negative.   Respiratory: Negative.   Cardiovascular: Negative for chest pain.  Gastrointestinal: Negative.   Genitourinary: Negative.   Musculoskeletal: Negative.   Skin: Negative.   Endo/Heme/Allergies: Negative.   Psychiatric/Behavioral: Negative.    Blood pressure 136/97, pulse 72, temperature 97.9 F (36.6 C), temperature source Oral, resp. rate 23, weight 88 lb 2.9 oz (40 kg), SpO2 95.00%. Physical Exam  Constitutional: She appears distressed.  Neck: No JVD present.  Cardiovascular: Normal rate and regular rhythm.   No murmur heard. Pulses:      Carotid pulses are 2+ on the right side, and 2+ on the left side.       Dorsalis pedis pulses are 2+ on the right side, and 2+ on the left side.       Posterior tibial pulses are 2+ on the right side, and 2+ on the left side.  Respiratory: Effort normal and breath sounds normal.  GI: Soft. Bowel sounds are normal.  Musculoskeletal: Normal range of motion. She exhibits no edema.  Skin: She is not diaphoretic.    ECG- incomplete LBBB with rate 60's.   Assessment/Plan:  Stroke- The patient is currently receiving thrombolytics and is being seen the Neurology. Stroke is known to cause elevation of troponin levels and is generally viewed as a poor prognostic sign.   Elevated troponin/NSTEMI- This is likely from her acute stroke. I do not see any significant changes in her ECG and she has no cardiac symptoms at this time. She is not in cardiogenic shock. It is possible that she had coronary vasospasm at the time of cerebral occlusion. The elevated troponin is unlikely to represent a coronary plaque rupture. There is no indication for coronary angiography at this time. I would not recommend systemic anticoagulation for ASC/NSTEMI. Recommend restarting her plavix once safe. Her lasix should be continued to prevent volume overload. She does appear euvolemic today.   Non-ischemic cardiomyopathy- Bailey Trumbo appears euvolemic today. Would resume her lasix at her home dose.   Hypertension- Blood pressure is slightly elevated today, but we will defer blood pressure goal to the neurology service.  Robyne Peers, MD   Robyne Peers 11/24/2012, 7:23 PM

## 2012-11-24 NOTE — ED Notes (Signed)
Patient requested a pillow and was given one for comfort measures.

## 2012-11-25 ENCOUNTER — Inpatient Hospital Stay (HOSPITAL_COMMUNITY): Payer: Medicare Other

## 2012-11-25 DIAGNOSIS — I6350 Cerebral infarction due to unspecified occlusion or stenosis of unspecified cerebral artery: Secondary | ICD-10-CM

## 2012-11-25 LAB — MRSA PCR SCREENING: MRSA by PCR: NEGATIVE

## 2012-11-25 LAB — LIPID PANEL
Cholesterol: 175 mg/dL (ref 0–200)
LDL Cholesterol: 85 mg/dL (ref 0–99)
Total CHOL/HDL Ratio: 2.2 RATIO
Triglycerides: 59 mg/dL (ref <150)
VLDL: 12 mg/dL (ref 0–40)

## 2012-11-25 LAB — GLUCOSE, CAPILLARY
Glucose-Capillary: 72 mg/dL (ref 70–99)
Glucose-Capillary: 83 mg/dL (ref 70–99)
Glucose-Capillary: 91 mg/dL (ref 70–99)

## 2012-11-25 LAB — HEMOGLOBIN A1C
Hgb A1c MFr Bld: 6.2 % — ABNORMAL HIGH (ref <5.7)
Mean Plasma Glucose: 131 mg/dL — ABNORMAL HIGH (ref <117)

## 2012-11-25 MED ORDER — WARFARIN SODIUM 4 MG PO TABS
4.0000 mg | ORAL_TABLET | Freq: Once | ORAL | Status: AC
  Administered 2012-11-25: 4 mg via ORAL
  Filled 2012-11-25: qty 1

## 2012-11-25 MED ORDER — PATIENT'S GUIDE TO USING COUMADIN BOOK
Freq: Once | Status: AC
  Administered 2012-11-25: 20:00:00
  Filled 2012-11-25: qty 1

## 2012-11-25 MED ORDER — WARFARIN VIDEO: Freq: Once | Status: DC

## 2012-11-25 MED ORDER — ENOXAPARIN SODIUM 30 MG/0.3ML ~~LOC~~ SOLN
30.0000 mg | SUBCUTANEOUS | Status: DC
  Administered 2012-11-25: 30 mg via SUBCUTANEOUS
  Filled 2012-11-25 (×2): qty 0.3

## 2012-11-25 MED ORDER — PERFLUTREN LIPID MICROSPHERE
1.0000 mL | INTRAVENOUS | Status: AC | PRN
  Administered 2012-11-25: 7 mL via INTRAVENOUS
  Filled 2012-11-25: qty 10

## 2012-11-25 MED ORDER — ASPIRIN 325 MG PO TABS
325.0000 mg | ORAL_TABLET | Freq: Every day | ORAL | Status: DC
  Filled 2012-11-25: qty 1

## 2012-11-25 MED ORDER — BIOTENE DRY MOUTH MT LIQD
15.0000 mL | Freq: Two times a day (BID) | OROMUCOSAL | Status: DC
  Administered 2012-11-25 – 2012-11-29 (×8): 15 mL via OROMUCOSAL

## 2012-11-25 MED ORDER — WARFARIN - PHARMACIST DOSING INPATIENT: Freq: Every day | Status: DC

## 2012-11-25 MED ORDER — CHLORHEXIDINE GLUCONATE 0.12 % MT SOLN
15.0000 mL | Freq: Two times a day (BID) | OROMUCOSAL | Status: DC
  Administered 2012-11-25 – 2012-11-29 (×8): 15 mL via OROMUCOSAL
  Filled 2012-11-25 (×10): qty 15

## 2012-11-25 MED ORDER — CLOPIDOGREL BISULFATE 75 MG PO TABS
75.0000 mg | ORAL_TABLET | Freq: Every day | ORAL | Status: DC
  Filled 2012-11-25: qty 1

## 2012-11-25 NOTE — ED Provider Notes (Signed)
I saw and evaluated the patient, reviewed the resident's note and I agree with the findings and plan.  CRITICAL CARE Performed by: Redgie Grayer, DAVID   Total critical care time: 30  Critical care time was exclusive of separately billable procedures and treating other patients.  Critical care was necessary to treat or prevent imminent or life-threatening deterioration.  Critical care was time spent personally by me on the following activities: development of treatment plan with patient and/or surrogate as well as nursing, discussions with consultants, evaluation of patient's response to treatment, examination of patient, obtaining history from patient or surrogate, ordering and performing treatments and interventions, ordering and review of laboratory studies, ordering and review of radiographic studies, pulse oximetry and re-evaluation of patient's condition.  Admit to neurology for stroke.  TPA given after review of CT and MRI.  VSS.  Concern for cardiac ischemia due to elevated trop - NSTEMI.  Neurology aware and c/s placed to cardiology.    Pt t/s to ICU in stable condition.     Darlys Gales, MD 11/25/12 [phone]

## 2012-11-25 NOTE — Progress Notes (Signed)
Pt failed RN swallow screen in ED because she had to remain flat due to TPA administration. I re-screened her this AM but she failed again because she coughed when taking small sips from a cup.

## 2012-11-25 NOTE — Evaluation (Addendum)
Clinical/Bedside Swallow Evaluation Patient Details  Name: [name] MRN: [id number] Date of Birth: 02-Feb-1946  Today's Date: 11/25/2012 Time: 1530-1600 SLP Time Calculation (min): 30 min  Past Medical History:  Past Medical History  Diagnosis Date  . Weight loss, unintentional   . Trouble swallowing   . Change in voice   . Substance abuse   . Dementia   . Asthma   . Shortness of breath   . Seizures     "    It has been along time "  . CHF (congestive heart failure)   . Headache(784.0)   . Hypertension   . Arthralgia   . Tremors of nervous system   . Anxiety   . Rib fractures     hx of May 2012  . Heart attack 05/21/10  . Schizophrenia   . Coronary artery disease   . Stroke 07/11/10    hx of R CVA   . COPD (chronic obstructive pulmonary disease)   . Obstructive sleep apnea   . Breast cancer 01/28/11    L breast, inv ductal/in situ, ER/PR -, Her2 -   Past Surgical History:  Past Surgical History  Procedure Laterality Date  . Total abdominal hysterectomy  age 29   HPI:  [name] is a 67 y.o. female who was apparently eating a english muffin earlier when she became suddenly dysarthric. It is unclear if she had any other changes earlier. She has had multiple strokes in the past and volunteers that she is not taking any medications.  PMH significiant for seizures, dementia, substance abuse, HTN COPD, and non-ischemic cardiomyopathy.  MRI:  Acute infarction affects the left cerebellum and right posterior temporal regions.   Cognitive Linguistic Evaluation ordered per Stroke Protocol.    Assessment / Plan / Recommendation Clinical Impression  Cognitive Linguistic Evaluation completed.  Indicates moderate dysarthria affecting intelligibility of connected speech at sentence level.  Moderate expressive aphasia with paraphasias noted during responsive and visual confrontational naming tasks.  Moderate cogntive deficit observed in areas of problem solving, safety  awarenes, and sustained attention.  Intelligibility of speech noted to improve at end of evaluation. ST indicated in Acute Care setting to improve functional communication and cognitive skills.      Aspiration Risk  Moderate    Diet Recommendation NPO except meds   Medication Administration: Crushed with puree    Other  Recommendations Oral Care Recommendations: Oral care QID   Follow Up Recommendations  Skilled Nursing facility    Frequency and Duration min 2x/week  2 weeks       SLP Swallow Goals Patient will utilize recommended strategies during swallow to increase swallowing safety with: Moderate assistance   Swallow Study Prior Functional Status  Cognitive/Linguistic Baseline: Baseline deficits Type of Home: Apartment  Lives With: Family Available Help at Discharge: Family;Available 24 hours/day Vocation: Retired    General Date of Onset: 11/24/12 HPI: [name] is a 67 y.o. female who was apparently eating a english muffin earlier when she became suddenly dysarthric. It is unclear if she had any other changes earlier. She has had multiple strokes in the past and volunteers that she is not taking any medications.  PMH significiant for seizures, dementia, substance abuse, HTN COPD, and non-ischemic cardiomyopathy.  Type of Study: Bedside swallow evaluation Diet Prior to this Study: NPO Temperature Spikes Noted: No Respiratory Status: Room air History of Recent Intubation: No Behavior/Cognition: Alert;Cooperative;Pleasant mood;Distractible;Requires cueing Oral Cavity - Dentition: Dentures, top;Missing dentition Self-Feeding Abilities: Able  to feed self;Needs assist Patient Positioning: Upright in bed Baseline Vocal Quality: Hoarse;Low vocal intensity;Breathy Volitional Cough: Weak Volitional Swallow: Able to elicit    Oral/Motor/Sensory Function Overall Oral Motor/Sensory Function: Impaired Labial ROM: Reduced right Labial Symmetry: Abnormal symmetry  right Labial Strength: Reduced Labial Sensation: Reduced Lingual ROM: Reduced right Lingual Symmetry: Abnormal symmetry right Lingual Strength: Reduced Lingual Sensation: Reduced Facial ROM: Reduced right Facial Symmetry: Right droop Facial Strength: Reduced Facial Sensation: Reduced Velum: Within Functional Limits Mandible: Within Functional Limits   Ice Chips Ice chips: Not tested   Thin Liquid Thin Liquid: Impaired Presentation: Cup;Spoon Oral Phase Impairments: Reduced labial seal;Reduced lingual movement/coordination;Impaired anterior to posterior transit;Poor awareness of bolus Oral Phase Functional Implications: Right anterior spillage;Oral holding Pharyngeal  Phase Impairments: Suspected delayed Swallow;Decreased hyoid-laryngeal movement;Multiple swallows;Cough - Delayed;Change in Vital Signs;Throat Clearing - Delayed    Nectar Thick Nectar Thick Liquid: Impaired Presentation: Cup;Spoon Oral Phase Impairments: Reduced labial seal;Reduced lingual movement/coordination;Impaired anterior to posterior transit Oral phase functional implications: Right anterior spillage;Prolonged oral transit Pharyngeal Phase Impairments: Suspected delayed Swallow;Decreased hyoid-laryngeal movement;Throat Clearing - Delayed;Cough - Delayed;Change in Vital Signs   Honey Thick Honey Thick Liquid: Not tested   Puree Puree: Impaired Oral Phase Impairments: Reduced labial seal;Reduced lingual movement/coordination Oral Phase Functional Implications: Right anterior spillage;Prolonged oral transit Pharyngeal Phase Impairments: Suspected delayed Swallow;Decreased hyoid-laryngeal movement;Throat Clearing - Delayed   Solid   GO    Solid: Not tested      Moreen Fowler MS, CCC-SLP [phone] [facility] 11/25/2012,5:59 PM

## 2012-11-25 NOTE — Progress Notes (Signed)
Coumadin was ordered by cardiologist after 2-D echo results.  He d/c'd ASA & Plavix.  Dr. Amada Jupiter was notified of this change and he said coumadin was OK to give once 24 post-tPa CT results were back.  He was also going to write a one-time order for Plavix bc Coumadin levels would not be therapeutic immediately.

## 2012-11-25 NOTE — Progress Notes (Signed)
Subjective:  Patient denies any chest pain or shortness of breath. Still has difficulty with speech. Received TPA yesterday tolerated well.  Objective:  Vital Signs in the last 24 hours: Temp:  [97.1 F (36.2 C)-97.9 F (36.6 C)] 97.1 F (36.2 C) (10/05 0815) Pulse Rate:  [38-139] 139 (10/05 1000) Resp:  [15-28] 19 (10/05 1000) BP: (102-169)/(64-116) 108/76 mmHg (10/05 1000) SpO2:  [89 %-100 %] 96 % (10/05 1000) Weight:  [40 kg (88 lb 2.9 oz)] 40 kg (88 lb 2.9 oz) (10/04 2100)  Intake/Output from previous day: 10/04 0701 - 10/05 0700 In: 471.7 [I.V.:471.7] Out: 375 [Urine:375] Intake/Output from this shift: Total I/O In: 150 [I.V.:150] Out: -   Physical Exam: Neck: no adenopathy, no carotid bruit, no JVD and supple, symmetrical, trachea midline Lungs: Decreased breath sound at bases Heart: regular rate and rhythm, S1, S2 normal and Soft systolic murmur and S4 gallop noted Abdomen: soft, non-tender; bowel sounds normal; no masses,  no organomegaly Extremities: extremities normal, atraumatic, no cyanosis or edema  Lab Results:  Recent Labs  11/24/12 1729 11/24/12 1737  WBC 4.7  --   HGB 15.1* 16.7*  PLT 233  --     Recent Labs  11/24/12 1729 11/24/12 1737  NA 144 145  K 3.4* 3.4*  CL 103 107  CO2 25  --   GLUCOSE 145* 149*  BUN 20 20  CREATININE 1.17* 1.30*    Recent Labs  11/24/12 1729 11/24/12 1853  TROPONINI 2.88* 2.53*   Hepatic Function Panel  Recent Labs  11/24/12 1729  PROT 8.1  ALBUMIN 3.9  AST 34  ALT 8  ALKPHOS 93  BILITOT 0.6    Recent Labs  11/25/12 0401  CHOL 175   No results found for this basename: PROTIME,  in the last 72 hours  Imaging: Imaging results have been reviewed and Ct Head (brain) Wo Contrast  11/24/2012   CLINICAL DATA:  Could stroke, new onset slurred speech. Multiple previous cerebral infarctions.  EXAM: CT HEAD WITHOUT CONTRAST  TECHNIQUE: Contiguous axial images were obtained from the base of the skull  through the vertex without intravenous contrast.  COMPARISON:  Head CT 06/24/2012  FINDINGS: There are multiple cortical infarctions unchanged from prior CT including the medial right frontal lobe, right parietal lobe, left temporal operculum, and medial left occipital lobe.  No CT evidence of acute cortical infarction. No intracranial hemorrhage. There is ventricular dilatation associated with the remote infarctions. There is periventricular and deep white matter hypodensities unchanged also.  Paranasal sinuses and mastoid air cells are clear.  IMPRESSION: 1. No evidence of acute cortical infarction.  2. No evidence of intracranial hemorrhage.  3. Multiple remote cortical hemispheric infarctions are again demonstrated.  4.  Atrophy and chronic microvascular disease again demonstrated.  Findings conveyed to Dr. Amada Jupiter on 11/24/2012 at17:42.   Electronically Signed   By: Genevive Bi M.D.   On: 11/24/2012 17:44   Mr Brain Wo Contrast  11/24/2012   CLINICAL DATA:  Code stroke.  Altered mental status. Dementia.  EXAM: MRI HEAD WITHOUT CONTRAST  TECHNIQUE: Multiplanar, multisequence MR imaging was performed. No intravenous contrast was administered.  COMPARISON:  CT head earlier in the day.  FINDINGS: The patient was only able to undergo diffusion imaging and a single FLAIR sequence, beyond which she could not remain motionless. Important information was obtained however.  There is evidence for an acute left superior cerebellar infarct affecting much of the hemisphere and also a portion of the superior  vermis. There is no significant mass effect on the 4th ventricle and no visible hemorrhage.  In addition, there is a 2nd, acute right posterior temporal infarction affecting the cortex and subcortical white matter. This infarction is approximately 2 cm cross-section. Again no visible in areas of hemorrhage.  Extensive encephalomalacia related to remote bihemispheric infarctions. Advanced premature atrophy and  chronic microvascular ischemic change.  IMPRESSION: Acute infarction affects the left cerebellum and right posterior temporal regions. See discussion above.   Electronically Signed   By: Davonna Belling M.D.   On: 11/24/2012 18:47   Dg Chest Port 1 View  11/24/2012   CLINICAL DATA:  Elevated troponins  EXAM: PORTABLE CHEST - 1 VIEW  COMPARISON:  06/28/2012  FINDINGS: The heart size and mediastinal contours are within normal limits. Both lungs are clear. The visualized skeletal structures show old rib fractures on the left.  IMPRESSION: No active disease.   Electronically Signed   By: Alcide Clever M.D.   On: 11/24/2012 20:17   Mr Maxine Glenn Head/brain Wo Cm  11/25/2012   CLINICAL DATA:  Followup CVA affecting the left cerebellum and right posterior temporal regions. Stroke risk factors include hypertension, cocaine abuse, previous stroke, and myocardial infarction.  EXAM: MRA HEAD WITHOUT CONTRAST  TECHNIQUE: MRA HEAD WITHOUT CONTRAST  COMPARISON:  MRI brain performed 11/24/2012. MRA head performed 09/27/2011.  FINDINGS: The right internal carotid artery is larger than the left due to supply of both anterior cerebrals from the right. There is no right or left carotid stenosis.  Hypoplastic A1 ACA on the left. No proximal M1 or A1 stenosis on the right. No M1 stenosis on the left.  Widely patent basilar artery with left vertebral dominant. Mild non stenotic irregularity distal V4 segment, right vertebral artery. This is not flow reducing. This is stable from previous. Both PICA branches are patent.  There is no proximal PCA stenosis. Both SCA origins are patent. Neither AICA branch is seen.  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  Distal right ACA branches are diseased. For flow related enhancement in the bilateral middle cerebral artery M2 and M3 branches appear slightly diminished from priors.  Compared with previous exam, flow related enhancement in the distal MCA branches is less well seen today. No  progression of posterior circulation disease.  IMPRESSION: No proximal ICA, MCA, basilar, or vertebral flow reducing lesion. Mild intracranial atherosclerotic change of the distal MCA vessels may have slightly progressed from 2013. 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   Electronically Signed   By: Davonna Belling M.D.   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 Will discuss with neuro.  LOS: 1 day    Luverne Farone N 11/25/2012, 10:34 AM

## 2012-11-25 NOTE — Progress Notes (Signed)
Stroke Team Progress Note  HISTORY [name] is a 67 y.o. female who was apparently eating a english muffin earlier when she became suddenly dysarthric. It is unclear if she had any other changes earlier. She has had multiple strokes in the past and volunteers that she is not taking any medications.  Her history was very unclear initially and therefore she was taken to MRI which confirms two recent infarcts. She does endorse vision changes, and says that they started at the same time as her other symptoms. She currently is very dysarthric, and very ataxic on the left.  Initially, time of onset was very unclear and I was unable to get a hold of family. Finally after repeated attempts I was able to get a hold her niece that Saw her onset and was able to confirm time of onset TPA completed at 20:15  Pt is L hand dominant.    SUBJECTIVE Weakness improved, speech is still dysarthric.   OBJECTIVE Most recent Vital Signs: Filed Vitals:   11/25/12 0700 11/25/12 0739 11/25/12 0800 11/25/12 0815  BP: 136/65  151/78   Pulse: 44  56   Temp:  97.1 F (36.2 C)  97.1 F (36.2 C)  TempSrc:  Oral    Resp: 16  28   Height:      Weight:      SpO2: 99%  97%    CBG (last 3)   Recent Labs  11/24/12 1741 11/24/12 2332 11/25/12 0713  GLUCAP 145* 91 70    IV Fluid Intake:   . sodium chloride 50 mL/hr at 11/25/12 0900    MEDICATIONS  . pantoprazole (PROTONIX) IV  40 mg Intravenous QHS  . pneumococcal 23 valent vaccine  0.5 mL Intramuscular Tomorrow-1000   PRN:  acetaminophen, acetaminophen, labetalol  Diet:  NPO  Activity:  Bedrest assistance DVT Prophylaxis:  Start lovenox 24hrs post TPA  CLINICALLY SIGNIFICANT STUDIES Basic Metabolic Panel:  Recent Labs Lab 11/24/12 1729 11/24/12 1737  NA 144 145  K 3.4* 3.4*  CL 103 107  CO2 25  --   GLUCOSE 145* 149*  BUN 20 20  CREATININE 1.17* 1.30*  CALCIUM 9.8  --    Liver Function Tests:  Recent Labs Lab 11/24/12 1729  AST  34  ALT 8  ALKPHOS 93  BILITOT 0.6  PROT 8.1  ALBUMIN 3.9   CBC:  Recent Labs Lab 11/24/12 1729 11/24/12 1737  WBC 4.7  --   NEUTROABS 1.6*  --   HGB 15.1* 16.7*  HCT 45.2 49.0*  MCV 91.5  --   PLT 233  --    Coagulation:  Recent Labs Lab 11/24/12 1729  LABPROT 13.2  INR 1.02   Cardiac Enzymes:  Recent Labs Lab 11/24/12 1729 11/24/12 1853  TROPONINI 2.88* 2.53*   Urinalysis:  Recent Labs Lab 11/24/12 1950  COLORURINE YELLOW  LABSPEC 1.020  PHURINE 6.0  GLUCOSEU NEGATIVE  HGBUR NEGATIVE  BILIRUBINUR NEGATIVE  KETONESUR 15*  PROTEINUR 30*  UROBILINOGEN 1.0  NITRITE NEGATIVE  LEUKOCYTESUR SMALL*   Lipid Panel    Component Value Date/Time   CHOL 175 11/25/2012 0401   TRIG 59 11/25/2012 0401   HDL 78 11/25/2012 0401   CHOLHDL 2.2 11/25/2012 0401   VLDL 12 11/25/2012 0401   LDLCALC 85 11/25/2012 0401   HgbA1C  Lab Results  Component Value Date   HGBA1C 6.1* 06/25/2012    Urine Drug Screen:     Component Value Date/Time   LABOPIA NONE DETECTED  11/24/2012 1950   LABOPIA NEGATIVE 04/01/2009 1007   COCAINSCRNUR NONE DETECTED 11/24/2012 1950   COCAINSCRNUR  Value: POSITIVE (NOTE) Result repeated and verified. Sent for confirmatory testing* 04/01/2009 1007   LABBENZ NONE DETECTED 11/24/2012 1950   LABBENZ NEGATIVE 04/01/2009 1007   AMPHETMU NONE DETECTED 11/24/2012 1950   AMPHETMU NEGATIVE 04/01/2009 1007   THCU NONE DETECTED 11/24/2012 1950   LABBARB NONE DETECTED 11/24/2012 1950    Alcohol Level:  Recent Labs Lab 11/24/12 1729  ETH <11    Ct Head (brain) Wo Contrast  11/24/2012   CLINICAL DATA:  Could stroke, new onset slurred speech. Multiple previous cerebral infarctions.  EXAM: CT HEAD WITHOUT CONTRAST  TECHNIQUE: Contiguous axial images were obtained from the base of the skull through the vertex without intravenous contrast.  COMPARISON:  Head CT 06/24/2012  FINDINGS: There are multiple cortical infarctions unchanged from prior CT including the medial right  frontal lobe, right parietal lobe, left temporal operculum, and medial left occipital lobe.  No CT evidence of acute cortical infarction. No intracranial hemorrhage. There is ventricular dilatation associated with the remote infarctions. There is periventricular and deep white matter hypodensities unchanged also.  Paranasal sinuses and mastoid air cells are clear.  IMPRESSION: 1. No evidence of acute cortical infarction.  2. No evidence of intracranial hemorrhage.  3. Multiple remote cortical hemispheric infarctions are again demonstrated.  4.  Atrophy and chronic microvascular disease again demonstrated.  Findings conveyed to Dr. Amada Jupiter on 11/24/2012 at17:42.   Electronically Signed   By: Genevive Bi M.D.   On: 11/24/2012 17:44   Mr Brain Wo Contrast  11/24/2012   CLINICAL DATA:  Code stroke.  Altered mental status. Dementia.  EXAM: MRI HEAD WITHOUT CONTRAST  TECHNIQUE: Multiplanar, multisequence MR imaging was performed. No intravenous contrast was administered.  COMPARISON:  CT head earlier in the day.  FINDINGS: The patient was only able to undergo diffusion imaging and a single FLAIR sequence, beyond which she could not remain motionless. Important information was obtained however.  There is evidence for an acute left superior cerebellar infarct affecting much of the hemisphere and also a portion of the superior vermis. There is no significant mass effect on the 4th ventricle and no visible hemorrhage.  In addition, there is a 2nd, acute right posterior temporal infarction affecting the cortex and subcortical white matter. This infarction is approximately 2 cm cross-section. Again no visible in areas of hemorrhage.  Extensive encephalomalacia related to remote bihemispheric infarctions. Advanced premature atrophy and chronic microvascular ischemic change.  IMPRESSION: Acute infarction affects the left cerebellum and right posterior temporal regions. See discussion above.   Electronically Signed    By: Davonna Belling M.D.   On: 11/24/2012 18:47   Dg Chest Port 1 View  11/24/2012   CLINICAL DATA:  Elevated troponins  EXAM: PORTABLE CHEST - 1 VIEW  COMPARISON:  06/28/2012  FINDINGS: The heart size and mediastinal contours are within normal limits. Both lungs are clear. The visualized skeletal structures show old rib fractures on the left.  IMPRESSION: No active disease.   Electronically Signed   By: Alcide Clever M.D.   On: 11/24/2012 20:17    CT of the brain  No evidence of acute cortical infarction.   MRI of the brain  Acute infarction affects the left cerebellum and right posterior  temporal regions.    MRA of the brain    2D Echocardiogram    Carotid Doppler    CXR    EKG  normal EKG, normal sinus rhythm, unchanged from previous tracings.   Therapy Recommendations pending  Physical Exam   General: in bed, appears nauseous.  CV: RRR  Abd: Nontender nondistended  Respiratory: Clear to auscultation  Extremities: No edema  Mental Status:  Patient is awake, alert, she is  dysarthric, but follows commands well. She is able to answer some questions, but typically insists on speaking which is very difficult to understand. She is able to name some objects.  Cranial Nerves:  II: Visual Fields are difficult to assess, but she does fixate and track, and does occasionally blink to threat from the left. Pupils are equal, round, and reactive to light. Discs are difficult to visualize.  III,IV, VI: EOMI with mildly disconjugate gaze at times , though then she is able to conjugate. Post surgical R pupil V: Endorses sensation bilaterally  VII: Facial movement is notable for right droop  VIII: hearing is intact to voice  X: Does not comply  XI: Does not comply  XII: tongue is midline without atrophy or fasciculations.  Motor:  Tone is normal. Bulk is normal. 5/5 strength was present on the right side, on the left she does have some leg weakness 4/5, as well as mild arm weakness 4+/5.   Sensory:  Sensation is symmetric to light touch in the arms and legs.  Cerebellar:  She is grossly ataxic in the left arm and leg, she is mild difficulty with finger-nose-finger on the right  Gait:  Not assessed due to acute nature of evaluation and multiple medical monitors in ED setting   ASSESSMENT [name] is a 67 y.o. female with acute onset visual change and dysarthria and ataxia in the setting of multiple previous strokes. She is noncompliant with medications. She is s/p TPA.   Hospital day # 1  TREATMENT/PLAN  CTH 24 hrs post TPA  Plavix 24 hrs post TPA home med (non compliant)  lovenox 30 daily DVT prophylaxis  2d echo with bubble  Telemetry, so far no arhythmia.   Possibly TEE early next week since strokes in multiple vascular territories.   Carotid  HbA1c/lipid panel  Speech eval for swallowing  Pt/ot  Accuchecks q4hrs for hypoglycemia.   Elevated Troponin cardiology following thinking this is due to Stroke will restart her plavix 24 hrs post stroke   11/25/2012 10:00 AM  I have personally obtained a history, examined the patient, evaluated imaging results, and formulated the assessment and plan of care. I agree with the above.

## 2012-11-25 NOTE — Progress Notes (Signed)
PT Cancellation Note  Patient Details Name: [name] MRN: [id number] DOB: 1945/10/28   Cancelled Treatment:    Reason Eval/Treat Not Completed: Medical issues which prohibited therapy. Pt remains on strict bedrest s/p TPA.  Will defer mobility eval today and return next date to reassess.  Thank you.    Narda Amber [facility] 11/25/2012, 9:06 AM

## 2012-11-25 NOTE — Evaluation (Signed)
Clinical/Bedside Swallow Evaluation Patient Details  Name: [name] MRN: [id number] Date of Birth: 08-31-1945  Today's Date: 11/25/2012 Time: 1500-1530 SLP Time Calculation (min): 30 min  Past Medical History:  Past Medical History  Diagnosis Date  . Weight loss, unintentional   . Trouble swallowing   . Change in voice   . Substance abuse   . Dementia   . Asthma   . Shortness of breath   . Seizures     "    It has been along time "  . CHF (congestive heart failure)   . Headache(784.0)   . Hypertension   . Arthralgia   . Tremors of nervous system   . Anxiety   . Rib fractures     hx of May 2012  . Heart attack 05/21/10  . Schizophrenia   . Coronary artery disease   . Stroke 07/11/10    hx of R CVA   . COPD (chronic obstructive pulmonary disease)   . Obstructive sleep apnea   . Breast cancer 01/28/11    L breast, inv ductal/in situ, ER/PR -, Her2 -   Past Surgical History:  Past Surgical History  Procedure Laterality Date  . Total abdominal hysterectomy  age 70   HPI:  [name] presents today with a chief complain of stroke. 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 She did have a coronary angiogram back in 2010 which showed no significant coronary artery disease. She presents today with dysarthria. MRI of the brain confirmed new acute ischemic strokes. She presented in a timely fashion to the ED, so she is now receiving thrombolytics. Her troponin was elevated on arrival and has continued to elevate while in the ED. She denies any chest pain or pressure at this time. She thinks that she had been doing well prior to today's stroke.  BSE ordered per Stroke Protocol.   Assessment / Plan / Recommendation Clinical Impression  BSE completed. Min to moderate oral phase dysphagia marked mainly by right sided facial, labial, and lingual weakness.  Anterior spillage occurred with  trial liquids and puree consistency due to poor labial seal.  Suspected pharyngeal dysphagia with    + s/s of penetration vs. aspiration s/p swallow of all liquid consistencies. Sensed cough and throat clear ineffective as patient with continued wet vocal quality.    Reduced hyoid laryngeal elevation as noted per palpation with majority of swallows incomplete indicating incomplete airway closure.  Recommend continued NPO status with exception of medication crushed in puree.  ST to reassess swallow for PO readiness on 11/26/12 vs. Readiness to complete objective evaluation.      Aspiration Risk  Moderate    Diet Recommendation NPO except meds   Medication Administration: Crushed with puree    Other  Recommendations Oral Care Recommendations: Oral care QID   Follow Up Recommendations  Skilled Nursing facility    Frequency and Duration min 2x/week  2 weeks       SLP Swallow Goals Patient will utilize recommended strategies during swallow to increase swallowing safety with: Moderate assistance   Swallow Study Prior Functional Status   Lived at home     General Date of Onset: 11/24/12 HPI: [name] presents today with a chief complain of stroke. 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 She did have a coronary angiogram back in 2010 which showed no significant  coronary artery disease. She presents today with dysarthria. MRI of the brain confirmed new acute ischemic strokes. She presented in a timely fashion to the ED, so she is now receiving thrombolytics. Her troponin was elevated on arrival and has continued to elevate while in the ED. She denies any chest pain or pressure at this time. She thinks that she had been doing well prior to today's stroke. She denies any SOB as well. Her ECG shows an incomplete LBBB which is unchanged from prior.  Type of Study: Bedside swallow evaluation Diet Prior to  this Study: NPO Temperature Spikes Noted: No Respiratory Status: Room air History of Recent Intubation: No Behavior/Cognition: Alert;Cooperative;Pleasant mood;Distractible;Requires cueing Oral Cavity - Dentition: Dentures, top;Missing dentition Self-Feeding Abilities: Able to feed self;Needs assist Patient Positioning: Upright in bed Baseline Vocal Quality: Hoarse;Low vocal intensity;Breathy Volitional Cough: Weak Volitional Swallow: Able to elicit    Oral/Motor/Sensory Function Overall Oral Motor/Sensory Function: Impaired Labial ROM: Reduced right Labial Symmetry: Abnormal symmetry right Labial Strength: Reduced Labial Sensation: Reduced Lingual ROM: Reduced right Lingual Symmetry: Abnormal symmetry right Lingual Strength: Reduced Lingual Sensation: Reduced Facial ROM: Reduced right Facial Symmetry: Right droop Facial Strength: Reduced Facial Sensation: Reduced Velum: Within Functional Limits Mandible: Within Functional Limits   Ice Chips Ice chips: Not tested   Thin Liquid Thin Liquid: Impaired Presentation: Cup;Spoon Oral Phase Impairments: Reduced labial seal;Reduced lingual movement/coordination;Impaired anterior to posterior transit;Poor awareness of bolus Oral Phase Functional Implications: Right anterior spillage;Oral holding Pharyngeal  Phase Impairments: Suspected delayed Swallow;Decreased hyoid-laryngeal movement;Multiple swallows;Cough - Delayed;Change in Vital Signs;Throat Clearing - Delayed    Nectar Thick Nectar Thick Liquid: Impaired Presentation: Cup;Spoon Oral Phase Impairments: Reduced labial seal;Reduced lingual movement/coordination;Impaired anterior to posterior transit Oral phase functional implications: Right anterior spillage;Prolonged oral transit Pharyngeal Phase Impairments: Suspected delayed Swallow;Decreased hyoid-laryngeal movement;Throat Clearing - Delayed;Cough - Delayed;Change in Vital Signs   Honey Thick Honey Thick Liquid: Not tested    Puree Puree: Impaired Oral Phase Impairments: Reduced labial seal;Reduced lingual movement/coordination Oral Phase Functional Implications: Right anterior spillage;Prolonged oral transit Pharyngeal Phase Impairments: Suspected delayed Swallow;Decreased hyoid-laryngeal movement;Throat Clearing - Delayed   Solid   GO    Solid: Not tested      Moreen Fowler Bailey, CCC-SLp [phone] [facility] 11/25/2012,5:22 PM

## 2012-11-25 NOTE — Progress Notes (Signed)
  Echocardiogram 2D Echocardiogram with Definity has been performed.  [name] FRANCES 11/25/2012, 12:45 PM

## 2012-11-25 NOTE — Progress Notes (Signed)
ANTICOAGULATION CONSULT NOTE - Initial Consult  Pharmacy Consult:  Coumadin Indication:  cardioembolic CVA with apical thrombus  Allergies  Allergen Reactions  . Codeine Itching    All over the body  . Penicillins Hives and Other (See Comments)    "Whelps" per patient.  . Hydralazine     02/28/11 Family unsure if this is allergy for pt    Patient Measurements: Height: 5\' 1"  (154.9 cm) Weight: 88 lb 2.9 oz (40 kg) IBW/kg (Calculated) : 47.8  Vital Signs: Temp: 97.5 F (36.4 C) (10/05 1131) Temp src: Oral (10/05 1131) BP: 130/71 mmHg (10/05 1400) Pulse Rate: 54 (10/05 1400)  Labs:  Recent Labs  11/24/12 1729 11/24/12 1737 11/24/12 1853  HGB 15.1* 16.7*  --   HCT 45.2 49.0*  --   PLT 233  --   --   APTT 33  --   --   LABPROT 13.2  --   --   INR 1.02  --   --   CREATININE 1.17* 1.30*  --   TROPONINI 2.88*  --  2.53*    Estimated Creatinine Clearance: 26.5 ml/min (by C-G formula based on Cr of 1.3).   Medical History: Past Medical History  Diagnosis Date  . Weight loss, unintentional   . Trouble swallowing   . Change in voice   . Substance abuse   . Dementia   . Asthma   . Shortness of breath   . Seizures     "    It has been along time "  . CHF (congestive heart failure)   . Headache(784.0)   . Hypertension   . Arthralgia   . Tremors of nervous system   . Anxiety   . Rib fractures     hx of May 2012  . Heart attack 05/21/10  . Schizophrenia   . Coronary artery disease   . Stroke 07/11/10    hx of R CVA   . COPD (chronic obstructive pulmonary disease)   . Obstructive sleep apnea   . Breast cancer 01/28/11    L breast, inv ductal/in situ, ER/PR -, Her2 -       Assessment: 78 YOF s/p tPA for Code Stroke and was started on prophylactic Lovenox, now to add Coumadin for cardioembolic CVA with apical clot.  Baseline INR is at 1.02.  No bleeding documented.   Goal of Therapy:  INR 2-3 Monitor platelets by anticoagulation protocol: Yes    Plan:   - Coumadin 4mg  PO today - Lovenox 30mg  SQ Q24H - Daily PT / INR - Coumadin book / video    Severn Goddard D. Laney Potash, PharmD, BCPS Pager:  [phone] 11/25/2012, 3:39 PM

## 2012-11-26 ENCOUNTER — Inpatient Hospital Stay (HOSPITAL_COMMUNITY): Payer: Medicare Other

## 2012-11-26 LAB — GLUCOSE, CAPILLARY
Glucose-Capillary: 105 mg/dL — ABNORMAL HIGH (ref 70–99)
Glucose-Capillary: 82 mg/dL (ref 70–99)
Glucose-Capillary: 70 mg/dL (ref 70–99)
Glucose-Capillary: 166 mg/dL — ABNORMAL HIGH (ref 70–99)
Glucose-Capillary: 57 mg/dL — ABNORMAL LOW (ref 70–99)
Glucose-Capillary: 84 mg/dL (ref 70–99)

## 2012-11-26 LAB — PROTIME-INR: Prothrombin Time: 15.7 seconds — ABNORMAL HIGH (ref 11.6–15.2)

## 2012-11-26 LAB — URINE CULTURE: Colony Count: >=100000

## 2012-11-26 LAB — HEPARIN LEVEL (UNFRACTIONATED): Heparin Unfractionated: 0.26 IU/mL — ABNORMAL LOW (ref 0.30–0.70)

## 2012-11-26 MED ORDER — WARFARIN - PHARMACIST DOSING INPATIENT: Freq: Every day | Status: DC

## 2012-11-26 MED ORDER — WARFARIN SODIUM 3 MG PO TABS
3.0000 mg | ORAL_TABLET | Freq: Once | ORAL | Status: AC
  Administered 2012-11-26: 3 mg via ORAL
  Filled 2012-11-26: qty 1

## 2012-11-26 MED ORDER — DEXTROSE 50 % IV SOLN
25.0000 mL | Freq: Once | INTRAVENOUS | Status: AC | PRN
  Administered 2012-11-26: 25 mL via INTRAVENOUS
  Filled 2012-11-26: qty 50

## 2012-11-26 MED ORDER — ASPIRIN 300 MG RE SUPP
300.0000 mg | Freq: Every day | RECTAL | Status: DC
  Administered 2012-11-26: 300 mg via RECTAL
  Filled 2012-11-26: qty 1

## 2012-11-26 MED ORDER — HEPARIN (PORCINE) IN NACL 100-0.45 UNIT/ML-% IJ SOLN
650.0000 [IU]/h | INTRAMUSCULAR | Status: DC
  Administered 2012-11-26: 550 [IU]/h via INTRAVENOUS
  Administered 2012-11-28: 650 [IU]/h via INTRAVENOUS
  Filled 2012-11-26 (×2): qty 250

## 2012-11-26 MED ORDER — PANTOPRAZOLE SODIUM 40 MG PO TBEC
40.0000 mg | DELAYED_RELEASE_TABLET | Freq: Every day | ORAL | Status: DC
  Administered 2012-11-26 – 2012-11-28 (×3): 40 mg via ORAL
  Filled 2012-11-26 (×3): qty 1

## 2012-11-26 MED ORDER — HEPARIN (PORCINE) IN NACL 100-0.45 UNIT/ML-% IJ SOLN
500.0000 [IU]/h | INTRAMUSCULAR | Status: DC
  Administered 2012-11-26: 500 [IU]/h via INTRAVENOUS
  Filled 2012-11-26: qty 250

## 2012-11-26 NOTE — Progress Notes (Signed)
Subjective:  Patient complains of vague upper abdominal and chest pain still has significant dysarthria.  Cardiac enzymes are trending down.  2-D echo showed apical thrombus with severely depressed LV systolic function and global hypokinesia  Objective:  Vital Signs in the last 24 hours: Temp:  [97.4 F (36.3 C)-98.7 F (37.1 C)] 97.4 F (36.3 C) (10/06 1232) Pulse Rate:  [36-114] 89 (10/06 1500) Resp:  [15-26] 22 (10/06 1500) BP: (105-165)/(67-99) 109/73 mmHg (10/06 1500) SpO2:  [92 %-100 %] 100 % (10/06 1500)  Intake/Output from previous day: 10/05 0701 - 10/06 0700 In: 1200 [I.V.:1200] Out: 575 [Urine:575] Intake/Output from this shift: Total I/O In: 590 [P.O.:240; I.V.:350] Out: 275 [Urine:275]  Physical Exam: Neck: no adenopathy, no carotid bruit, no JVD and supple, symmetrical, trachea midline Lungs: decrease breath sounds at bases Heart: regular rate and rhythm, S1, S2 normal and soft systolic murmur noted Abdomen: soft, non-tender; bowel sounds normal; no masses,  no organomegaly Extremities: extremities normal, atraumatic, no cyanosis or edema  Lab Results:  Recent Labs  11/24/12 1729 11/24/12 1737  WBC 4.7  --   HGB 15.1* 16.7*  PLT 233  --     Recent Labs  11/24/12 1729 11/24/12 1737  NA 144 145  K 3.4* 3.4*  CL 103 107  CO2 25  --   GLUCOSE 145* 149*  BUN 20 20  CREATININE 1.17* 1.30*    Recent Labs  11/24/12 1729 11/24/12 1853  TROPONINI 2.88* 2.53*   Hepatic Function Panel  Recent Labs  11/24/12 1729  PROT 8.1  ALBUMIN 3.9  AST 34  ALT 8  ALKPHOS 93  BILITOT 0.6    Recent Labs  11/25/12 0401  CHOL 175   No results found for this basename: PROTIME,  in the last 72 hours  Imaging: Imaging results have been reviewed and Ct Head Wo Contrast  11/25/2012   CLINICAL DATA:  Status post tPA dose  EXAM: CT HEAD WITHOUT CONTRAST  TECHNIQUE: Contiguous axial images were obtained from the base of the skull through the vertex without  intravenous contrast.  COMPARISON:  11/24/2012  FINDINGS: The bony calvarium is again intact. There again noted changes of prior infarcts bilaterally stable from the prior exam. No acute hemorrhage is identified. Chronic white matter ischemic and atrophic changes are seen. New rounded area of hypodensity is noted in the left cerebellum consistent with the recent infarct. This is new from the prior exam.  IMPRESSION: Acute left cerebellar infarct with some progression from the prior exam. . The hypodensity measures approximately 2.8 x 1.8 cm in greatest transverse and AP views dimensions respectively. This was not well seen on the recent CT examination but was identified on the previous MRI.  No acute hemorrhage is seen.   Electronically Signed   By: Alcide Clever M.D.   On: 11/25/2012 19:21   Ct Head (brain) Wo Contrast  11/24/2012   CLINICAL DATA:  Could stroke, new onset slurred speech. Multiple previous cerebral infarctions.  EXAM: CT HEAD WITHOUT CONTRAST  TECHNIQUE: Contiguous axial images were obtained from the base of the skull through the vertex without intravenous contrast.  COMPARISON:  Head CT 06/24/2012  FINDINGS: There are multiple cortical infarctions unchanged from prior CT including the medial right frontal lobe, right parietal lobe, left temporal operculum, and medial left occipital lobe.  No CT evidence of acute cortical infarction. No intracranial hemorrhage. There is ventricular dilatation associated with the remote infarctions. There is periventricular and deep white matter hypodensities unchanged  also.  Paranasal sinuses and mastoid air cells are clear.  IMPRESSION: 1. No evidence of acute cortical infarction.  2. No evidence of intracranial hemorrhage.  3. Multiple remote cortical hemispheric infarctions are again demonstrated.  4.  Atrophy and chronic microvascular disease again demonstrated.  Findings conveyed to Dr. Amada Jupiter on 11/24/2012 at17:42.   Electronically Signed   By: Genevive Bi M.D.   On: 11/24/2012 17:44   Mr Brain Wo Contrast  11/24/2012   CLINICAL DATA:  Code stroke.  Altered mental status. Dementia.  EXAM: MRI HEAD WITHOUT CONTRAST  TECHNIQUE: Multiplanar, multisequence MR imaging was performed. No intravenous contrast was administered.  COMPARISON:  CT head earlier in the day.  FINDINGS: The patient was only able to undergo diffusion imaging and a single FLAIR sequence, beyond which she could not remain motionless. Important information was obtained however.  There is evidence for an acute left superior cerebellar infarct affecting much of the hemisphere and also a portion of the superior vermis. There is no significant mass effect on the 4th ventricle and no visible hemorrhage.  In addition, there is a 2nd, acute right posterior temporal infarction affecting the cortex and subcortical white matter. This infarction is approximately 2 cm cross-section. Again no visible in areas of hemorrhage.  Extensive encephalomalacia related to remote bihemispheric infarctions. Advanced premature atrophy and chronic microvascular ischemic change.  IMPRESSION: Acute infarction affects the left cerebellum and right posterior temporal regions. See discussion above.   Electronically Signed   By: Davonna Belling M.D.   On: 11/24/2012 18:47   Dg Chest Port 1 View  11/24/2012   CLINICAL DATA:  Elevated troponins  EXAM: PORTABLE CHEST - 1 VIEW  COMPARISON:  06/28/2012  FINDINGS: The heart size and mediastinal contours are within normal limits. Both lungs are clear. The visualized skeletal structures show old rib fractures on the left.  IMPRESSION: No active disease.   Electronically Signed   By: Alcide Clever M.D.   On: 11/24/2012 20:17   Dg Swallowing Func-speech Pathology  11/26/2012   Riley Nearing Deblois, CCC-SLP     11/26/2012  1:54 PM Objective Swallowing Evaluation: Modified Barium Swallowing Study   Patient Details  Name: BRIGETT ESTELL MRN: [id number] Date of Birth: Sep 02, 1945   Today's Date: 11/26/2012 Time: 1130-1205 SLP Time Calculation (min): 35 min  Past Medical History:  Past Medical History  Diagnosis Date  . Weight loss, unintentional   . Trouble swallowing   . Change in voice   . Substance abuse   . Dementia   . Asthma   . Shortness of breath   . Seizures     "    It has been along time "  . CHF (congestive heart failure)   . Headache(784.0)   . Hypertension   . Arthralgia   . Tremors of nervous system   . Anxiety   . Rib fractures     hx of May 2012  . Heart attack 05/21/10  . Schizophrenia   . Coronary artery disease   . Stroke 07/11/10    hx of R CVA   . COPD (chronic obstructive pulmonary disease)   . Obstructive sleep apnea   . Breast cancer 01/28/11    L breast, inv ductal/in situ, ER/PR -, Her2 -   Past Surgical History:  Past Surgical History  Procedure Laterality Date  . Total abdominal hysterectomy  age 1   HPI:  EILEE SCHADER is a 67 y.o. female who was apparently eating  a  english muffin earlier when she became suddenly dysarthric. It is  unclear if she had any other changes earlier. She has had  multiple strokes in the past and volunteers that she is not  taking any medications.  PMH significiant for seizures, dementia,  substance abuse, HTN COPD, and non-ischemic cardiomyopathy.      Assessment / Plan / Recommendation Clinical Impression  Dysphagia Diagnosis: Moderate oral phase dysphagia;Moderate  pharyngeal phase dysphagia Clinical impression: Pt presents with a primary oral dysphagia  with limited coordinated movement for bolus formation. There is a  thrusting movment for propulsion that leads to premature spillage  with delayed swallow initiation. Moderate residuals remain post  swallow, primarily in right buccal cavity that are likely to  spill to pharynx post swallow. There was one instance of silent  aspiration before the swallow due to particularly rapid, forceful  transit. Thin liquids were consistently aspirated. Otherwise  nectar thick liquids and pureed  solids appear to be safest diet.  Pt will need reminders to clear right side and swallow again.  Capacity to recall and independently use this strategy appear  limited. Suggest frequent oral suction to right side during meals  as well.     Treatment Recommendation  Therapy as outlined in treatment plan below    Diet Recommendation Dysphagia 1 (Puree);Nectar-thick liquid   Liquid Administration via: Cup Medication Administration: Whole meds with puree Supervision: Full supervision/cueing for compensatory strategies Compensations: Slow rate;Small sips/bites;Check for  pocketing;Check for anterior loss;Multiple dry swallows after  each bite/sip Postural Changes and/or Swallow Maneuvers: Seated upright 90  degrees;Upright 30-60 min after meal    Other  Recommendations Oral Care Recommendations: Oral care  before and after PO Other Recommendations: Order thickener from pharmacy   Follow Up Recommendations  Skilled Nursing facility    Frequency and Duration min 2x/week  2 weeks   Pertinent Vitals/Pain NA    SLP Swallow Goals Patient will utilize recommended strategies during swallow to  increase swallowing safety with: Moderate assistance Swallow Study Goal #2 - Progress: Progressing toward goal   General HPI: JAZZLENE HUOT is a 67 y.o. female who was  apparently eating a english muffin earlier when she became  suddenly dysarthric. It is unclear if she had any other changes  earlier. She has had multiple strokes in the past and volunteers  that she is not taking any medications.  PMH significiant for  seizures, dementia, substance abuse, HTN COPD, and non-ischemic  cardiomyopathy.  Type of Study: Modified Barium Swallowing Study Reason for Referral: Objectively evaluate swallowing function Previous Swallow Assessment: MBS 2012 - Dys 1/nectar Diet Prior to this Study: NPO Temperature Spikes Noted: No Respiratory Status: Room air History of Recent Intubation: No Behavior/Cognition: Alert;Cooperative;Confused;Requires   cueing;Decreased sustained attention Oral Cavity - Dentition: Dentures, top;Missing dentition Oral Motor / Sensory Function: Impaired - see Bedside swallow  eval Self-Feeding Abilities: Needs assist Patient Positioning: Upright in chair Baseline Vocal Quality: Low vocal intensity Volitional Cough: Weak Volitional Swallow: Able to elicit Anatomy: Within functional limits Pharyngeal Secretions: Not observed secondary MBS    Reason for Referral Objectively evaluate swallowing function   Oral Phase Oral Preparation/Oral Phase Oral Phase: Impaired Oral Phase - Comment Oral Phase - Comment: Oral residual particularly in right buccal  cavity, lingual thrusting for bolus formation. Significant  residuals with mechanical soft texture.    Pharyngeal Phase Pharyngeal Phase Pharyngeal Phase: Impaired Pharyngeal - Nectar Pharyngeal - Nectar Teaspoon: Delayed swallow  initiation;Premature spillage to valleculae Pharyngeal -  Nectar Cup: Delayed swallow initiation;Premature  spillage to valleculae;Penetration/Aspiration before  swallow;Trace aspiration Penetration/Aspiration details (nectar cup): Material enters  airway, passes BELOW cords without attempt by patient to eject  out (silent aspiration);Material does not enter airway Pharyngeal - Nectar Straw: Premature spillage to pyriform  sinuses;Delayed swallow initiation Pharyngeal - Thin Pharyngeal - Thin Cup: Delayed swallow initiation;Premature  spillage to pyriform sinuses;Penetration/Aspiration before  swallow Penetration/Aspiration details (thin cup): Material enters  airway, passes BELOW cords without attempt by patient to eject  out (silent aspiration) Pharyngeal - Solids Pharyngeal - Puree: Delayed swallow initiation;Premature spillage  to valleculae Pharyngeal - Mechanical Soft: Delayed swallow  initiation;Premature spillage to valleculae  Cervical Esophageal Phase    GO    Cervical Esophageal Phase Cervical Esophageal Phase: [facility]        Harlon Ditty, MA CCC-SLP  [phone]  DeBlois, Riley Nearing 11/26/2012, 1:53 PM    Mr Maxine Glenn Head/brain Wo Cm  11/25/2012   CLINICAL DATA:  Followup CVA affecting the left cerebellum and right posterior temporal regions. Stroke risk factors include hypertension, cocaine abuse, previous stroke, and myocardial infarction.  EXAM: MRA HEAD WITHOUT CONTRAST  TECHNIQUE: MRA HEAD WITHOUT CONTRAST  COMPARISON:  MRI brain performed 11/24/2012. MRA head performed 09/27/2011.  FINDINGS: The right internal carotid artery is larger than the left due to supply of both anterior cerebrals from the right. There is no right or left carotid stenosis.  Hypoplastic A1 ACA on the left. No proximal M1 or A1 stenosis on the right. No M1 stenosis on the left.  Widely patent basilar artery with left vertebral dominant. Mild non stenotic irregularity distal V4 segment, right vertebral artery. This is not flow reducing. This is stable from previous. Both PICA branches are patent.  There is no proximal PCA stenosis. Both SCA origins are patent. Neither AICA branch is seen.  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  Distal right ACA branches are diseased. For flow related enhancement in the bilateral middle cerebral artery M2 and M3 branches appear slightly diminished from priors.  Compared with previous exam, flow related enhancement in the distal MCA branches is less well seen today. No progression of posterior circulation disease.  IMPRESSION: No proximal ICA, MCA, basilar, or vertebral flow reducing lesion. Mild intracranial atherosclerotic change of the distal MCA vessels may have slightly progressed from 2013. 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   Electronically Signed   By: Davonna Belling M.D.   On: 11/25/2012 10:20    Cardiac Studies:  Assessment/Plan:  Status post acute left cerebellar and right posterior temporal infarct status post TPA probably cardioembolic  Probable cardioembolic small non-Q-wave myocardial infarction  Severe nonischemic  dilated cardiomyopathy  Hypertension  COPD  Tobacco abuse  Hypercholesteremia  History of dementia  History of schizophrenia  History of CVA of breast  History of polysubstance abuse  Cachexia Plan Start heparin per pharmacy protocol and no bolus. Will need skilled nursing facility once ready for discharge.  Will discuss with family  LOS: 2 days    Seamus Warehime N 11/26/2012, 3:47 PM

## 2012-11-26 NOTE — Progress Notes (Signed)
Guy Franco, PA-C made aware patient complain of chest pain 10/10, intermittent, and hurts in her back. Currently denies pain and tech is performing 12 lead EKG. Continuing to monitor closely.

## 2012-11-26 NOTE — Progress Notes (Signed)
Dr. Sharyn Lull at bedside and made aware patient complain of intermittent chest pain, currently "not as bad as before". 12 lead EKG shown to MD. Orders received and carried out. Continuing to monitor closely.

## 2012-11-26 NOTE — Progress Notes (Signed)
PT Cancellation Note  Patient Details Name: [name] MRN: [id number] DOB: 1945/04/17   Cancelled Treatment:    Reason Eval/Treat Not Completed: Medical issues which prohibited therapy--on strict bedrest; experiencing chest pain   Mayjor Ager 11/26/2012, 1:49 PM Pager [phone]

## 2012-11-26 NOTE — Progress Notes (Signed)
ANTICOAGULATION CONSULT NOTE - Follow Up Consult  Pharmacy Consult for Heparin Indication: cardioembolic CVA with apical thrombus  Allergies  Allergen Reactions  . Codeine Itching    All over the body  . Penicillins Hives and Other (See Comments)    "Whelps" per patient.  . Hydralazine     02/28/11 Family unsure if this is allergy for pt   Patient Measurements: Height: 5\' 1"  (154.9 cm) Weight: 88 lb 2.9 oz (40 kg) IBW/kg (Calculated) : 47.8 Vital Signs: Temp: 97.4 F (36.3 C) (10/06 1232) Temp src: Oral (10/06 1232) BP: 131/71 mmHg (10/06 1800) Pulse Rate: 26 (10/06 1800) Labs:  Recent Labs  11/24/12 1729 11/24/12 1737 11/24/12 1853 11/26/12 1025 11/26/12 2225  HGB 15.1* 16.7*  --   --   --   HCT 45.2 49.0*  --   --   --   PLT 233  --   --   --   --   APTT 33  --   --   --   --   LABPROT 13.2  --   --  15.7*  --   INR 1.02  --   --  1.28  --   HEPARINUNFRC  --   --   --   --  0.26*  CREATININE 1.17* 1.30*  --   --   --   TROPONINI 2.88*  --  2.53*  --   --    Estimated Creatinine Clearance: 26.5 ml/min (by C-G formula based on Cr of 1.3).  Medications:  Infusions:  . sodium chloride 50 mL/hr at 11/26/12 1610  . heparin 500 Units/hr (11/26/12 2000)   Assessment: 67 YOF on IV heparin and Coumadin for cardioembolic CVA with apical clot. Heparin level is sub-therapeutic at 0.26 on 500 units/hr.   Goal of Therapy:  Heparin level 0.3-0.5 units/ml Monitor platelets by anticoagulation protocol: Yes   Plan:  1. Increase heparin to 550 units/hr. 2. Follow-up heparin level in 8 hours.   Fayne Norrie 11/26/2012,10:49 PM

## 2012-11-26 NOTE — Procedures (Signed)
Objective Swallowing Evaluation: Modified Barium Swallowing Study  Patient Details  Name: [name] MRN: [id number] Date of Birth: Oct 07, 1945  Today's Date: 11/26/2012 Time: 1130-1205 SLP Time Calculation (min): 35 min  Past Medical History:  Past Medical History  Diagnosis Date  . Weight loss, unintentional   . Trouble swallowing   . Change in voice   . Substance abuse   . Dementia   . Asthma   . Shortness of breath   . Seizures     "    It has been along time "  . CHF (congestive heart failure)   . Headache(784.0)   . Hypertension   . Arthralgia   . Tremors of nervous system   . Anxiety   . Rib fractures     hx of May 2012  . Heart attack 05/21/10  . Schizophrenia   . Coronary artery disease   . Stroke 07/11/10    hx of R CVA   . COPD (chronic obstructive pulmonary disease)   . Obstructive sleep apnea   . Breast cancer 01/28/11    L breast, inv ductal/in situ, ER/PR -, Her2 -   Past Surgical History:  Past Surgical History  Procedure Laterality Date  . Total abdominal hysterectomy  age 45   HPI:  [name] is a 67 y.o. female who was apparently eating a english muffin earlier when she became suddenly dysarthric. It is unclear if she had any other changes earlier. She has had multiple strokes in the past and volunteers that she is not taking any medications.  PMH significiant for seizures, dementia, substance abuse, HTN COPD, and non-ischemic cardiomyopathy.      Assessment / Plan / Recommendation Clinical Impression  Dysphagia Diagnosis: Moderate oral phase dysphagia;Moderate pharyngeal phase dysphagia Clinical impression: Pt presents with a primary oral dysphagia with limited coordinated movement for bolus formation. There is a thrusting movment for propulsion that leads to premature spillage with delayed swallow initiation. Moderate residuals remain post swallow, primarily in right buccal cavity that are likely to spill to pharynx post swallow. There  was one instance of silent aspiration before the swallow due to particularly rapid, forceful transit. Thin liquids were consistently aspirated. Otherwise nectar thick liquids and pureed solids appear to be safest diet. Pt will need reminders to clear right side and swallow again. Capacity to recall and independently use this strategy appear limited. Suggest frequent oral suction to right side during meals as well.     Treatment Recommendation  Therapy as outlined in treatment plan below    Diet Recommendation Dysphagia 1 (Puree);Nectar-thick liquid   Liquid Administration via: Cup Medication Administration: Whole meds with puree Supervision: Full supervision/cueing for compensatory strategies Compensations: Slow rate;Small sips/bites;Check for pocketing;Check for anterior loss;Multiple dry swallows after each bite/sip Postural Changes and/or Swallow Maneuvers: Seated upright 90 degrees;Upright 30-60 min after meal    Other  Recommendations Oral Care Recommendations: Oral care before and after PO Other Recommendations: Order thickener from pharmacy   Follow Up Recommendations  Skilled Nursing facility    Frequency and Duration min 2x/week  2 weeks   Pertinent Vitals/Pain NA    SLP Swallow Goals Patient will utilize recommended strategies during swallow to increase swallowing safety with: Moderate assistance Swallow Study Goal #2 - Progress: Progressing toward goal   General HPI: [name] is a 67 y.o. female who was apparently eating a english muffin earlier when she became suddenly dysarthric. It is unclear if she had any other changes earlier.  She has had multiple strokes in the past and volunteers that she is not taking any medications.  PMH significiant for seizures, dementia, substance abuse, HTN COPD, and non-ischemic cardiomyopathy.  Type of Study: Modified Barium Swallowing Study Reason for Referral: Objectively evaluate swallowing function Previous Swallow Assessment:  MBS 2012 - Dys 1/nectar Diet Prior to this Study: NPO Temperature Spikes Noted: No Respiratory Status: Room air History of Recent Intubation: No Behavior/Cognition: Alert;Cooperative;Confused;Requires cueing;Decreased sustained attention Oral Cavity - Dentition: Dentures, top;Missing dentition Oral Motor / Sensory Function: Impaired - see Bedside swallow eval Self-Feeding Abilities: Needs assist Patient Positioning: Upright in chair Baseline Vocal Quality: Low vocal intensity Volitional Cough: Weak Volitional Swallow: Able to elicit Anatomy: Within functional limits Pharyngeal Secretions: Not observed secondary MBS    Reason for Referral Objectively evaluate swallowing function   Oral Phase Oral Preparation/Oral Phase Oral Phase: Impaired Oral Phase - Comment Oral Phase - Comment: Oral residual particularly in right buccal cavity, lingual thrusting for bolus formation. Significant residuals with mechanical soft texture.    Pharyngeal Phase Pharyngeal Phase Pharyngeal Phase: Impaired Pharyngeal - Nectar Pharyngeal - Nectar Teaspoon: Delayed swallow initiation;Premature spillage to valleculae Pharyngeal - Nectar Cup: Delayed swallow initiation;Premature spillage to valleculae;Penetration/Aspiration before swallow;Trace aspiration Penetration/Aspiration details (nectar cup): Material enters airway, passes BELOW cords without attempt by patient to eject out (silent aspiration);Material does not enter airway Pharyngeal - Nectar Straw: Premature spillage to pyriform sinuses;Delayed swallow initiation Pharyngeal - Thin Pharyngeal - Thin Cup: Delayed swallow initiation;Premature spillage to pyriform sinuses;Penetration/Aspiration before swallow Penetration/Aspiration details (thin cup): Material enters airway, passes BELOW cords without attempt by patient to eject out (silent aspiration) Pharyngeal - Solids Pharyngeal - Puree: Delayed swallow initiation;Premature spillage to  valleculae Pharyngeal - Mechanical Soft: Delayed swallow initiation;Premature spillage to valleculae  Cervical Esophageal Phase    GO    Cervical Esophageal Phase Cervical Esophageal Phase: [facility]        Harlon Ditty, MA CCC-SLP [phone]  Kamdyn Covel, Riley Nearing 11/26/2012, 1:53 PM

## 2012-11-26 NOTE — Progress Notes (Signed)
OT Cancellation Note  Patient Details Name: [name] MRN: [id number] DOB: 1946-01-12   Cancelled Treatment:    Reason Eval/Treat Not Completed: Medical issues which prohibited therapy (Pt remains on strict bedrest).  Will initiate OT eval once activity orders increased.  Thanks!  Jeani Hawking M [phone] 11/26/2012, 10:14 AM

## 2012-11-26 NOTE — Progress Notes (Addendum)
Speech Language Pathology Dysphagia Treatment Patient Details Name: [name] MRN: [id number] DOB: 1946/01/24 Today's Date: 11/26/2012 Time: 4098-1191 SLP Time Calculation (min): 11 min  Assessment / Plan / Recommendation Clinical Impression  Treatment session focused on trials of honey thick liquids via tsp and puree via teasponn. There was one instance of delayed cough, but otherwise pt was able to orally manipulate and initiate swallow in 100% of trials. Pt ready for MBS to determine possibility for diet. SLP also engaged pt in speech intelligibility strategies, giving model and max cues for articulatory contacts at multisyllabic word level. Pt responsive with cues, but with poor prognosis for generalization due to cognitive deficits.     Diet Recommendation  Continue with Current Diet: NPO    SLP Plan MBS   Pertinent Vitals/Pain NA   Swallowing Goals     General Temperature Spikes Noted: No Respiratory Status: Room air Behavior/Cognition: Alert;Cooperative;Pleasant mood;Distractible;Requires cueing Oral Cavity - Dentition: Dentures, top;Missing dentition Patient Positioning: Upright in bed  Oral Cavity - Oral Hygiene Does patient have any of the following "at risk" factors?: Other - dysphagia Brush patient's teeth BID with toothbrush (using toothpaste with fluoride): No (Comment) Patient is HIGH RISK - Oral Care Protocol followed (see row info): Yes   Dysphagia Treatment Treatment focused on: Upgraded PO texture trials Treatment Methods/Modalities: Skilled observation Patient observed directly with PO's: Yes Type of PO's observed: Honey-thick liquids;Dysphagia 1 (puree) Liquids provided via: Teaspoon Pharyngeal Phase Signs & Symptoms: Suspected delayed swallow initiation;Delayed cough Type of cueing: Verbal Amount of cueing: Minimal   GO    Harlon Ditty, MA CCC-SLP [phone]  Claudine Mouton 11/26/2012, 10:28 AM

## 2012-11-26 NOTE — Progress Notes (Signed)
Dr. Pearlean Brownie at bedside and updated on patient condition. NIH 10, lab unable to obtain morning labs, and hypoglycemic event this a.m. With CBG=57; D50 given. Orders received and carried out. Continuing to monitor.

## 2012-11-26 NOTE — Progress Notes (Signed)
ANTICOAGULATION CONSULT NOTE - Initial Consult  Pharmacy Consult for Heparin and Coumadin Indication: cardioembolic CVA with apical thrombus  Allergies  Allergen Reactions  . Codeine Itching    All over the body  . Penicillins Hives and Other (See Comments)    "Whelps" per patient.  . Hydralazine     02/28/11 Family unsure if this is allergy for pt    Patient Measurements: Height: 5\' 1"  (154.9 cm) Weight: 88 lb 2.9 oz (40 kg) IBW/kg (Calculated) : 47.8  Vital Signs: Temp: 97.4 F (36.3 C) (10/06 1232) Temp src: Oral (10/06 1232) BP: 147/88 mmHg (10/06 0900) Pulse Rate: 47 (10/06 0900)  Labs:  Recent Labs  11/24/12 1729 11/24/12 1737 11/24/12 1853 11/26/12 1025  HGB 15.1* 16.7*  --   --   HCT 45.2 49.0*  --   --   PLT 233  --   --   --   APTT 33  --   --   --   LABPROT 13.2  --   --  15.7*  INR 1.02  --   --  1.28  CREATININE 1.17* 1.30*  --   --   TROPONINI 2.88*  --  2.53*  --     Estimated Creatinine Clearance: 26.5 ml/min (by C-G formula based on Cr of 1.3).   Medical History: Past Medical History  Diagnosis Date  . Weight loss, unintentional   . Trouble swallowing   . Change in voice   . Substance abuse   . Dementia   . Asthma   . Shortness of breath   . Seizures     "    It has been along time "  . CHF (congestive heart failure)   . Headache(784.0)   . Hypertension   . Arthralgia   . Tremors of nervous system   . Anxiety   . Rib fractures     hx of May 2012  . Heart attack 05/21/10  . Schizophrenia   . Coronary artery disease   . Stroke 07/11/10    hx of R CVA   . COPD (chronic obstructive pulmonary disease)   . Obstructive sleep apnea   . Breast cancer 01/28/11    L breast, inv ductal/in situ, ER/PR -, Her2 -    Medications:  Prescriptions prior to admission  Medication Sig Dispense Refill  . albuterol (PROVENTIL) 90 MCG/ACT inhaler Inhale 2 puffs into the lungs every 6 (six) hours as needed. For wheezing       .  budesonide-formoterol (SYMBICORT) 80-4.5 MCG/ACT inhaler Inhale 2 puffs into the lungs 2 (two) times daily.      . clopidogrel (PLAVIX) 75 MG tablet Take 75 mg by mouth daily.        . furosemide (LASIX) 40 MG tablet Take 40 mg by mouth daily.      . nitroGLYCERIN (NITROSTAT) 0.4 MG SL tablet Place 0.4 mg under the tongue every 5 (five) minutes as needed. For chest pain      . OLANZapine (ZYPREXA) 2.5 MG tablet Take 1 tablet (2.5 mg total) by mouth at bedtime.      . potassium chloride (K-DUR) 10 MEQ tablet Take 10 mEq by mouth daily.      . QUEtiapine (SEROQUEL) 50 MG tablet Take 50 mg by mouth daily.        Assessment: 67 yo female who presented to the ED on 10/4 with dysarthria and slurred speech and was given tPA at 19:14 for confirmed infarcts on MRI. 2D  echo also reports a small apical thrombus. Pharmacy consulted to begin a heparin drip and continue Coumadin as already ordered. INR is subtherapeutic at 1.28. Last Lovenox 30 mg SQ dose ws 10/5 at 20:01. No bleeding noted, last CBC on 10/4 was normal.   Goal of Therapy:  INR 2-3 Heparin level 0.3-0.5 units/ml Monitor platelets by anticoagulation protocol: Yes   Plan:  -Heparin 500 units/hr IV with no bolus -Coumadin 3 mg PO tonight -8 hr heparin level -Daily INR, CBC, and heparin level -Monitor for s/sx of bleeding  [facility], [address].D., BCPS Clinical Pharmacist Pager: [phone] 11/26/2012 12:51 PM

## 2012-11-26 NOTE — Progress Notes (Signed)
Stroke Team Progress Note  HISTORY [name] is a 67 y.o. female who was apparently eating a english muffin earlier when she became suddenly dysarthric. It is unclear if she had any other changes earlier. She has had multiple strokes in the past and volunteers that she is not taking any medications.  Her history was very unclear initially and therefore she was taken to MRI which confirms two recent infarcts. She does endorse vision changes, and says that they started at the same time as her other symptoms. She currently is very dysarthric, and very ataxic on the left.  Initially, time of onset was very unclear and I was unable to get a hold of family. Finally after repeated attempts I was able to get a hold her niece that Saw her onset and was able to confirm time of onset TPA completed at 20:15  Pt is L hand dominant.    SUBJECTIVE  Patient remains dysarthric. No chest pain at this time. Enzymes + for MI. Last echo EF 10-15%. + TPA    OBJECTIVE Most recent Vital Signs: Filed Vitals:   11/26/12 0600 11/26/12 0700 11/26/12 0753 11/26/12 0800  BP: 130/76 134/81  150/77  Pulse: 36 45  46  Temp:   98.7 F (37.1 C)   TempSrc:   Oral   Resp: 18 20  19   Height:      Weight:      SpO2: 98% 100%  95%   CBG (last 3)   Recent Labs  11/25/12 1505 11/25/12 1920 11/25/12 2215  GLUCAP 72 82 83    IV Fluid Intake:   . sodium chloride 50 mL/hr at 11/25/12 1900    MEDICATIONS  . antiseptic oral rinse  15 mL Mouth Rinse q12n4p  . chlorhexidine  15 mL Mouth Rinse BID  . enoxaparin (LOVENOX) injection  30 mg Subcutaneous Q24H  . pantoprazole  40 mg Oral QHS  . warfarin   Does not apply Once  . Warfarin - Pharmacist Dosing Inpatient   Does not apply q1800   PRN:  acetaminophen, acetaminophen, labetalol  Diet:  NPO  Activity:  Bedrest assistance DVT Prophylaxis:  Start lovenox 24hrs post TPA  CLINICALLY SIGNIFICANT STUDIES Basic Metabolic Panel:   Recent Labs Lab  11/24/12 1729 11/24/12 1737  NA 144 145  K 3.4* 3.4*  CL 103 107  CO2 25  --   GLUCOSE 145* 149*  BUN 20 20  CREATININE 1.17* 1.30*  CALCIUM 9.8  --    Liver Function Tests:   Recent Labs Lab 11/24/12 1729  AST 34  ALT 8  ALKPHOS 93  BILITOT 0.6  PROT 8.1  ALBUMIN 3.9   CBC:   Recent Labs Lab 11/24/12 1729 11/24/12 1737  WBC 4.7  --   NEUTROABS 1.6*  --   HGB 15.1* 16.7*  HCT 45.2 49.0*  MCV 91.5  --   PLT 233  --    Coagulation:   Recent Labs Lab 11/24/12 1729  LABPROT 13.2  INR 1.02   Cardiac Enzymes:   Recent Labs Lab 11/24/12 1729 11/24/12 1853  TROPONINI 2.88* 2.53*   Urinalysis:   Recent Labs Lab 11/24/12 1950  COLORURINE YELLOW  LABSPEC 1.020  PHURINE 6.0  GLUCOSEU NEGATIVE  HGBUR NEGATIVE  BILIRUBINUR NEGATIVE  KETONESUR 15*  PROTEINUR 30*  UROBILINOGEN 1.0  NITRITE NEGATIVE  LEUKOCYTESUR SMALL*   Lipid Panel    Component Value Date/Time   CHOL 175 11/25/2012 0401   TRIG 59 11/25/2012 0401  HDL 78 11/25/2012 0401   CHOLHDL 2.2 11/25/2012 0401   VLDL 12 11/25/2012 0401   LDLCALC 85 11/25/2012 0401   HgbA1C  Lab Results  Component Value Date   HGBA1C 6.2* 11/25/2012    Urine Drug Screen:     Component Value Date/Time   LABOPIA NONE DETECTED 11/24/2012 1950   LABOPIA NEGATIVE 04/01/2009 1007   COCAINSCRNUR NONE DETECTED 11/24/2012 1950   COCAINSCRNUR  Value: POSITIVE (NOTE) Result repeated and verified. Sent for confirmatory testing* 04/01/2009 1007   LABBENZ NONE DETECTED 11/24/2012 1950   LABBENZ NEGATIVE 04/01/2009 1007   AMPHETMU NONE DETECTED 11/24/2012 1950   AMPHETMU NEGATIVE 04/01/2009 1007   THCU NONE DETECTED 11/24/2012 1950   LABBARB NONE DETECTED 11/24/2012 1950    Alcohol Level:   Recent Labs Lab 11/24/12 1729  ETH <11    Ct Head Wo Contrast 11/25/2012  : Acute left cerebellar infarct with some progression from the prior exam. . The hypodensity measures approximately 2.8 x 1.8 cm in greatest transverse and AP  views dimensions respectively. This was not well seen on the recent CT examination but was identified on the previous MRI.  No acute hemorrhage is seen.  11/24/2012  : 1. No evidence of acute cortical infarction.  2. No evidence of intracranial hemorrhage.  3. Multiple remote cortical hemispheric infarctions are again demonstrated.  4.  Atrophy and chronic microvascular disease again demonstrated.   Mr Brain Wo Contrast 11/24/2012 Acute infarction affects the left cerebellum and right posterior temporal regions.   Dg Chest Port 1 View 11/24/2012   : No active disease.      Mr Maxine Glenn Head/brain Wo Cm 11/25/2012   No proximal ICA, MCA, basilar, or vertebral flow reducing lesion. Mild intracranial atherosclerotic change of the distal MCA vessels may have slightly progressed from 2013. 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       CT of the brain  No evidence of acute cortical infarction.  MRI of the brain  Acute infarction affects the left cerebellum and right posterior  temporal regions.   2D Echocardiogram  06/25/2012 Left ventricle: The cavity size was moderately dilated. The estimated ejection fraction was 10%. Diffuse hypokinesis. There was spontaneous echo contrast, indicative of stasis.- Mitral valve: Mild regurgitation.- Atrial septum: No defect or patent foramen ovale was  Identified 11/25/2012 Left ventricle: The cavity size was moderately dilated.The estimated ejection fraction was 15%, in the range of 10% to 15%. Wall motion was normal; there were no regional wall motion abnormalities. There was a thrombus. There was a small, apical thrombus. Atrial septum: No defect or patent foramen ovale was identified.  Carotid Doppler  angio  EKG  normal EKG, normal sinus rhythm, unchanged from previous tracings.   Therapy Recommendations SNF (has also failed swallow)  Physical Exam   General: in bed, appears nauseous.  CV: RRR  Abd: Nontender nondistended  Respiratory: Clear to auscultation   Extremities: No edema  Mental Status:  Patient is awake, alert, she is  dysarthric, but follows commands well. She is able to answer some questions, but typically insists on speaking which is very difficult to understand. She is able to name some objects.  Cranial Nerves:  II: Visual Fields are difficult to assess, but she does fixate and track, and does occasionally blink to threat from the left. Pupils are equal, round, and reactive to light. Discs are difficult to visualize.  III,IV, VI: EOMI with mildly disconjugate gaze at times , though then she is able to  conjugate. Post surgical R pupil V: Endorses sensation bilaterally  VII: Facial movement is notable for right droop  VIII: hearing is intact to voice  X: Does not comply  XI: Does not comply  XII: tongue is midline without atrophy or fasciculations.  Motor:  Tone is normal. Bulk is normal. 5/5 strength was present on the right side, on the left she does have some leg weakness 4/5, as well as mild arm weakness 4+/5.  Sensory:  Sensation is symmetric to light touch in the arms and legs.  Cerebellar:  She is grossly ataxic in the left arm and leg, she is mild difficulty with finger-nose-finger on the right  Gait:  Not assessed due to acute nature of evaluation and multiple medical monitors in ED setting  ASSESSMENT [name] is a 67 y.o. female with acute onset visual change and dysarthria and ataxia in the setting of multiple previous strokes. She is noncompliant with medications. MRI confirms L cerebellar and right posterior strokes consistent with embolic source of unknown source. She is s/p TPA  Arrival time 11/24/2012 at 17:21, given 1914. Multiple attenpts to reach family. Patient was on Plavix prior to admission, follow up CT/MR shows no hemorrhage; however, echo shows apical thrombus so IV heparin for coumadin bridge was ordered.   Apical thrombus  Hypertension  Dementia  Cachexia  COPD  HGB A1C   6.2  LDL 85, goal < 100, no statin indicated   UDS negative at this admission.  Hospital day # 2   TREATMENT/PLAN   Follow up Echo does show apical thrombus: start IV Heparin, coumadin load  Elevated Troponin cardiology following thinking this is due to Stroke   Risk factor modification.   Gwendolyn Lima. Manson Passey, [facility], MBA, MHA [facility] Pager: [phone] 11/26/2012 9:32 AM  I have personally obtained a history, examined the patient, evaluated imaging results, and formulated the assessment and plan of care. I agree with the above. Delia Heady, MD

## 2012-11-26 NOTE — Progress Notes (Deleted)
ANTICOAGULATION CONSULT NOTE - Follow Up Consult  Pharmacy Consult for coumadin Indication: probable cardioembolic CVA    Allergies  Allergen Reactions  . Codeine Itching    All over the body  . Penicillins Hives and Other (See Comments)    "Whelps" per patient.  . Hydralazine     02/28/11 Family unsure if this is allergy for pt    Patient Measurements: Height: 5\' 1"  (154.9 cm) Weight: 88 lb 2.9 oz (40 kg) IBW/kg (Calculated) : 47.8  Vital Signs: Temp: 97.8 F (36.6 C) (10/06 0400) Temp src: Oral (10/06 0400) BP: 134/81 mmHg (10/06 0700) Pulse Rate: 45 (10/06 0700)  Labs:  Recent Labs  11/24/12 1729 11/24/12 1737 11/24/12 1853  HGB 15.1* 16.7*  --   HCT 45.2 49.0*  --   PLT 233  --   --   APTT 33  --   --   LABPROT 13.2  --   --   INR 1.02  --   --   CREATININE 1.17* 1.30*  --   TROPONINI 2.88*  --  2.53*    Estimated Creatinine Clearance: 26.5 ml/min (by C-G formula based on Cr of 1.3).   Medications:  Scheduled:  . antiseptic oral rinse  15 mL Mouth Rinse q12n4p  . chlorhexidine  15 mL Mouth Rinse BID  . enoxaparin (LOVENOX) injection  30 mg Subcutaneous Q24H  . pantoprazole (PROTONIX) IV  40 mg Intravenous QHS  . warfarin   Does not apply Once  . Warfarin - Pharmacist Dosing Inpatient   Does not apply q1800    Assessment: 67 yo female with recurrent probable cardioembolic CVA on coumadin day 2. Patient noted s/p tPA on 10/4 and patient noted on enoxaparin 30mg  Emmet daily. Aspirin and plavix have been discontinued.   Goal of Therapy:  INR 2-3 Monitor platelets by anticoagulation protocol: Yes   Plan:  -Continue coumadin 4mg  po today -Daily PT/INR  Harland German, Pharm D 11/26/2012 7:44 AM

## 2012-11-27 DIAGNOSIS — E43 Unspecified severe protein-calorie malnutrition: Secondary | ICD-10-CM | POA: Diagnosis present

## 2012-11-27 LAB — CBC
HCT: 46 % (ref 36.0–46.0)
Hemoglobin: 15.4 g/dL — ABNORMAL HIGH (ref 12.0–15.0)
MCH: 30.4 pg (ref 26.0–34.0)
MCHC: 33.5 g/dL (ref 30.0–36.0)
MCV: 90.7 fL (ref 78.0–100.0)
Platelets: 192 K/uL (ref 150–400)
RBC: 5.07 MIL/uL (ref 3.87–5.11)
RDW: 13.2 % (ref 11.5–15.5)
WBC: 3.4 K/uL — ABNORMAL LOW (ref 4.0–10.5)

## 2012-11-27 LAB — GLUCOSE, CAPILLARY
Glucose-Capillary: 118 mg/dL — ABNORMAL HIGH (ref 70–99)
Glucose-Capillary: 72 mg/dL (ref 70–99)
Glucose-Capillary: 100 mg/dL — ABNORMAL HIGH (ref 70–99)
Glucose-Capillary: 81 mg/dL (ref 70–99)

## 2012-11-27 LAB — PROTIME-INR: Prothrombin Time: 17.3 seconds — ABNORMAL HIGH (ref 11.6–15.2)

## 2012-11-27 LAB — HEPARIN LEVEL (UNFRACTIONATED): Heparin Unfractionated: 0.43 IU/mL (ref 0.30–0.70)

## 2012-11-27 MED ORDER — WARFARIN SODIUM 4 MG PO TABS
4.0000 mg | ORAL_TABLET | Freq: Once | ORAL | Status: AC
  Administered 2012-11-27: 4 mg via ORAL
  Filled 2012-11-27: qty 1

## 2012-11-27 MED ORDER — LEVALBUTEROL HCL 0.63 MG/3ML IN NEBU
0.6300 mg | INHALATION_SOLUTION | Freq: Three times a day (TID) | RESPIRATORY_TRACT | Status: DC
  Administered 2012-11-27 – 2012-11-28 (×2): 0.63 mg via RESPIRATORY_TRACT
  Filled 2012-11-27 (×6): qty 3

## 2012-11-27 MED ORDER — RAMIPRIL 2.5 MG PO CAPS
2.5000 mg | ORAL_CAPSULE | Freq: Every day | ORAL | Status: DC
  Administered 2012-11-27 – 2012-11-29 (×3): 2.5 mg via ORAL
  Filled 2012-11-27 (×3): qty 1

## 2012-11-27 MED ORDER — RESOURCE THICKENUP CLEAR PO POWD
ORAL | Status: DC | PRN
  Filled 2012-11-27: qty 125

## 2012-11-27 NOTE — Progress Notes (Signed)
Rehab Admissions Coordinator Note:  Patient was screened by Clois Dupes for appropriateness for an Inpatient Acute Rehab Consult.  At this time, we are recommending Inpatient Rehab consult.  Clois Dupes 11/27/2012, 12:42 PM  I can be reached at [phone].

## 2012-11-27 NOTE — Progress Notes (Signed)
INITIAL NUTRITION ASSESSMENT  DOCUMENTATION CODES Per approved criteria  -Severe malnutrition in the context of chronic illness -Underweight   INTERVENTION: 1. Ensure Complete po BID, each supplement provides 350 kcal and 13 grams of protein.  NUTRITION DIAGNOSIS: Malnutrition related to chronic illness as evidenced by severe fat and muscle wasting.   Goal: Pt to meet >/= 90% of their estimated nutrition needs   Monitor:  PO intake, supplement acceptance, weight trend, labs   Reason for Assessment: PO intake  67 y.o. female  Admitting Dx: <principal problem not specified>  ASSESSMENT: Pt with hx of multiple strokes who is non-compliant with her medications is admitted with new left cerebellar and right posterior strokes.  Pt somewhat difficult to understand. Per pt her niece lives with her and does the cooking. Per pt she eats all the time. Meal completion documented here has been 30-60% of meals.  Pt unable to clarify with her weight loss started.  Pt with low potassium.   Nutrition Focused Physical Exam:  Subcutaneous Fat:  Orbital Region: severe wasting Upper Arm Region: severe wasting Thoracic and Lumbar Region: severe wasting  Muscle:  Temple Region: severe wasting Clavicle Bone Region: severe wasting Clavicle and Acromion Bone Region: severe wasting Scapular Bone Region: mild-moderate wasting Dorsal Hand: mild-moderate wasting Patellar Region: severe wasting Anterior Thigh Region: severe wasting Posterior Calf Region: severe wasting  Edema: not present   Height: Ht Readings from Last 1 Encounters:  11/24/12 5\' 1"  (1.549 m)    Weight: Wt Readings from Last 1 Encounters:  11/24/12 88 lb 2.9 oz (40 kg)    Ideal Body Weight: 47.7 kg   % Ideal Body Weight: 84%  Wt Readings from Last 10 Encounters:  11/24/12 88 lb 2.9 oz (40 kg)  06/24/12 87 lb 6.4 oz (39.644 kg)  10/19/11 97 lb 7.1 oz (44.2 kg)  10/10/11 97 lb 3.6 oz (44.1 kg)  04/13/11 100 lb  8.5 oz (45.6 kg)  04/06/11 102 lb (46.267 kg)  03/01/11 101 lb 14.4 oz (46.222 kg)  02/28/11 104 lb 11.2 oz (47.492 kg)  02/19/11 100 lb 8.5 oz (45.6 kg)  02/10/11 99 lb 9.6 oz (45.178 kg)    Usual Body Weight: 100 lb per pt  % Usual Body Weight: 88%  BMI:  Body mass index is 16.67 kg/(m^2).  Estimated Nutritional Needs: Kcal: 1200-1400 Protein: 60-70 grams Fluid: > 1.5 L/day  Skin: no issues noted  Diet Order: Dysphagia 1 with Nectar Thick Liquids Meal Completion: 30-60%   EDUCATION NEEDS: -No education needs identified at this time   Intake/Output Summary (Last 24 hours) at 11/27/12 0948 Last data filed at 11/27/12 0900  Gross per 24 hour  Intake 1749.69 ml  Output    500 ml  Net 1249.69 ml    Last BM: 10/6   Labs:   Recent Labs Lab 11/24/12 1729 11/24/12 1737  NA 144 145  K 3.4* 3.4*  CL 103 107  CO2 25  --   BUN 20 20  CREATININE 1.17* 1.30*  CALCIUM 9.8  --   GLUCOSE 145* 149*    CBG (last 3)   Recent Labs  11/26/12 1924 11/26/12 2251 11/27/12 0804  GLUCAP 84 118* 72    Scheduled Meds: . antiseptic oral rinse  15 mL Mouth Rinse q12n4p  . chlorhexidine  15 mL Mouth Rinse BID  . pantoprazole  40 mg Oral QHS  . Warfarin - Pharmacist Dosing Inpatient   Does not apply q1800    Continuous Infusions: .  sodium chloride 50 mL/hr at 11/26/12 1610  . heparin 550 Units/hr (11/26/12 2337)    Past Medical History  Diagnosis Date  . Weight loss, unintentional   . Trouble swallowing   . Change in voice   . Substance abuse   . Dementia   . Asthma   . Shortness of breath   . Seizures     "    It has been along time "  . CHF (congestive heart failure)   . Headache(784.0)   . Hypertension   . Arthralgia   . Tremors of nervous system   . Anxiety   . Rib fractures     hx of May 2012  . Heart attack 05/21/10  . Schizophrenia   . Coronary artery disease   . Stroke 07/11/10    hx of R CVA   . COPD (chronic obstructive pulmonary disease)    . Obstructive sleep apnea   . 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 Total abdominal hysterectomy  age [address] [place], [place], [place] [phone] Pager [phone] After Hours Pager

## 2012-11-27 NOTE — Evaluation (Signed)
Physical Therapy Evaluation Patient Details Name: [name] MRN: [id number] DOB: 1945/07/30 Today's Date: 11/27/2012 Time: 4098-1191 PT Time Calculation (min): 31 min  PT Assessment / Plan / Recommendation History of Present Illness  67 y.o. female who was apparently eating a english muffin earlier when she became suddenly dysarthric.She has had multiple strokes in the past. MRI confirms two recent infarcts. She does endorse vision changes, and says that they started at the same time as her other symptoms. She currently is very dysarthric, and very ataxic on the left.    Clinical Impression  Pt with further functional decline compared to PTA status. Pt with further impaired L UE/LE ataxia as well as co-ordination and strength deficits. Pt also with noted dysarthria which she didn't have as result from previous strokes. Pt was living with niece at mod I level. Pt to benefit from CIR to achieve safe mod I function for safe transition home with niece.     PT Assessment  Patient needs continued PT services    Follow Up Recommendations  CIR    Does the patient have the potential to tolerate intense rehabilitation      Barriers to Discharge        Equipment Recommendations  None recommended by PT    Recommendations for Other Services Rehab consult   Frequency Min 4X/week    Precautions / Restrictions Precautions Precautions: Fall Restrictions Weight Bearing Restrictions: No   Pertinent Vitals/Pain Denies pain      Mobility  Bed Mobility Bed Mobility: Supine to Sit;Sit to Supine Supine to Sit: 5: Supervision Sit to Supine: 5: Supervision Details for Bed Mobility Assistance: increased time, ataxic mvmts Transfers Transfers: Sit to Stand;Stand to Sit Sit to Stand: 4: Min assist Stand to Sit: 4: Min assist Details for Transfer Assistance: vc for safety. pt imipulsive Ambulation/Gait Ambulation/Gait Assistance: 3: Mod assist Ambulation Distance (Feet): 3  Feet Assistive device: Rolling walker Ambulation/Gait Assistance Details: pt ataxic with alternative amb pattern. pt easily fatigued. Pt with noted L LE tone Gait Pattern: Step-to pattern;Decreased stance time - left;Decreased step length - left;Decreased hip/knee flexion - left;Decreased dorsiflexion - right;Ataxic;Narrow base of support;Lateral trunk lean to left Gait velocity: slow General Gait Details: pt at increased falls risk. pt fatigue quickly requiring to sit down Stairs: No Modified Rankin (Stroke Patients Only) Pre-Morbid Rankin Score: Moderately severe disability Modified Rankin: Moderately severe disability    Exercises     PT Diagnosis: Difficulty walking;Hemiplegia non-dominant side;Generalized weakness;Abnormality of gait  PT Problem List: Decreased strength;Decreased activity tolerance;Decreased balance;Decreased mobility;Decreased coordination;Decreased cognition PT Treatment Interventions: DME instruction;Gait training;Functional mobility training;Therapeutic activities;Therapeutic exercise     PT Goals(Current goals can be found in the care plan section) Acute Rehab PT Goals Patient Stated Goal: go home PT Goal Formulation: With patient Time For Goal Achievement: 12/11/12 Potential to Achieve Goals: Good  Visit Information  Last PT Received On: 11/27/12 Assistance Needed: +2 PT/OT Co-Evaluation/Treatment: Yes History of Present Illness: 67 y.o. female who was apparently eating a english muffin earlier when she became suddenly dysarthric.She has had multiple strokes in the past. MRI confirms two recent infarcts. She does endorse vision changes, and says that they started at the same time as her other symptoms. She currently is very dysarthric, and very ataxic on the left.         Prior Functioning  Home Living Family/patient expects to be discharged to:: Private residence Living Arrangements: Other relatives Available Help at Discharge: Family;Available 24  hours/day Type of Home:  Apartment Home Access: Stairs to enter Entrance Stairs-Number of Steps: 1 Home Layout: One level Home Equipment: Bedside commode;Walker - 2 wheels;Cane - single point;Shower seat  Lives With: Family Prior Function Level of Independence: Independent with assistive device(s) Comments: walks inside with RW independently; outside with supervision Communication Communication: Expressive difficulties Dominant Hand: Right    Cognition  Cognition Arousal/Alertness: Awake/alert Behavior During Therapy: Restless Overall Cognitive Status: No family/caregiver present to determine baseline cognitive functioning Area of Impairment: Attention;Memory;Following commands;Safety/judgement;Awareness;Problem solving Current Attention Level: Sustained Memory: Decreased short-term memory Following Commands: Follows one step commands consistently Safety/Judgement: Decreased awareness of safety;Decreased awareness of deficits Awareness: Emergent Problem Solving: Slow processing;Requires verbal cues    Extremity/Trunk Assessment Upper Extremity Assessment Upper Extremity Assessment: RUE deficits/detail RUE Deficits / Details: RUE ataxia, but functional.  RUE Coordination: decreased fine motor;decreased gross motor (but funcitonal) LUE Deficits / Details: ataxia. AROM WNL. ataxia worse than RUE LUE Coordination: decreased fine motor;decreased gross motor Lower Extremity Assessment Lower Extremity Assessment: RLE deficits/detail;LLE deficits/detail RLE Deficits / Details: generalized weakness LLE Deficits / Details: LLE increased extensor tone. Ataxic. decreased funcitonal ue complared to baseline LLE Coordination: decreased gross motor Cervical / Trunk Assessment Cervical / Trunk Assessment: Other exceptions (R bias)   Balance Balance Balance Assessed: Yes Static Sitting Balance Static Sitting - Balance Support: Feet supported Static Sitting - Level of Assistance: 6: Modified  independent (Device/Increase time) Static Standing Balance Static Standing - Balance Support: Bilateral upper extremity supported Static Standing - Level of Assistance: 4: Min assist Static Standing - Comment/# of Minutes: pt stood x 3 prior to attempting to amb. c/o fatigue Dynamic Standing Balance Dynamic Standing - Balance Support: Bilateral upper extremity supported;During functional activity Dynamic Standing - Level of Assistance: 3: Mod assist  End of Session PT - End of Session Equipment Utilized During Treatment: Gait belt Activity Tolerance: Patient limited by fatigue Patient left: in bed;with call bell/phone within reach;with nursing/sitter in room (due to pt being transferred to 4N) Nurse Communication: Mobility status  GP     Marcene Brawn 11/27/2012, 12:38 PM  Lewis Shock, PT, DPT Pager #: [phone] Office #: [phone]

## 2012-11-27 NOTE — Progress Notes (Signed)
ANTICOAGULATION CONSULT NOTE  Pharmacy Consult for Heparin Indication: cardioembolic CVA with apical thrombus  Allergies  Allergen Reactions  . Codeine Itching    All over the body  . Penicillins Hives and Other (See Comments)    "Whelps" per patient.  . Hydralazine     02/28/11 Family unsure if this is allergy for pt    Patient Measurements: Height: 5\' 1"  (154.9 cm) Weight: 88 lb 2.9 oz (40 kg) IBW/kg (Calculated) : 47.8  Vital Signs: Temp: 97.4 F (36.3 C) (10/07 2131) Temp src: Oral (10/07 2131) BP: 108/46 mmHg (10/07 2131) Pulse Rate: 64 (10/07 2131)  Labs:  Recent Labs  11/26/12 1025 11/26/12 2225 11/27/12 0905 11/27/12 1223 11/27/12 2300  HGB  --   --  15.4*  --   --   HCT  --   --  46.0  --   --   PLT  --   --  192  --   --   LABPROT 15.7*  --   --  17.3*  --   INR 1.28  --   --  1.45  --   HEPARINUNFRC  --  0.26*  --  0.43 0.31    Estimated Creatinine Clearance: 26.5 ml/min (by C-G formula based on Cr of 1.3).  Assessment: 67 yo female with apical thrombus s/p CVA for heparin. Goal of Therapy:  Heparin level 0.3-0.5 units/ml Monitor platelets by anticoagulation protocol: Yes   Plan:  Continue Heparin at current rate  Follow-up am labs.  Geannie Risen, PharmD, BCPS   11/27/2012 11:41 PM

## 2012-11-27 NOTE — Consult Note (Signed)
Physical Medicine and Rehabilitation Consult Reason for Consult: CVA Referring Physician: Dr. Amada Jupiter   HPI: [name] is a 67 y.o. 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 Admitted 11/24/2012 with slurred speech. MRI of the brain showed acute infarcts affecting the left cerebellum and right posterior temporal regions. Also noted extensive encephalomalacia related to remote bihemispheric infarction. 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 Echocardiogram with ejection fraction of 15% no wall motion abnormalities and small apical thrombus. Neurology services consulted. Patient did receive TPA. Findings of elevated troponin/NSTEMI and followup cardiology services. No significant changes in EKG. Patient placed on Coumadin therapy for cardioembolic CVA with apical thrombus. Conservative medical management per cardiology services. Admission urine drug screen was negative. Patient currently maintained on a dysphagia 1 nectar thick liquid diet. Physical therapy evaluation completed 11/27/2012 with recommendations for physical medicine rehabilitation consult to consider inpatient rehabilitation services.   Review of Systems  Respiratory: Positive for shortness of breath.   Cardiovascular: Positive for leg swelling.  Musculoskeletal: Positive for myalgias.  Neurological: Positive for seizures and weakness.  Psychiatric/Behavioral: Positive for memory loss.       Schizophrenia  All other systems reviewed and are negative.   Past Medical History  Diagnosis Date  . Weight loss, unintentional   . Trouble swallowing   . Change in voice   . Substance abuse   . Dementia   . Asthma   . Shortness of breath    . Seizures     "    It has been along time "  . CHF (congestive heart failure)   . Headache(784.0)   . Hypertension   . Arthralgia   . Tremors of nervous system   . Anxiety   . Rib fractures     hx of May 2012  . Heart attack 05/21/10  . Schizophrenia   . Coronary artery disease   . Stroke 07/11/10    hx of R CVA   . COPD (chronic obstructive pulmonary disease)   . Obstructive sleep apnea   . 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 Total abdominal hysterectomy  age 89   Family History  Problem Relation Age of Onset  . Breast cancer Mother   . Birth defects Mother     breast  . Cancer Mother     breast  . Heart disease Father     heart attack  . Heart attack Father   . Heart attack Brother   . Cancer Brother     throat, lung  . Cancer Paternal Aunt   . Cancer Maternal Grandmother     breast    Social History:  reports that she quit smoking about 4 years ago. Her smoking use included Cigarettes. She has a 50 pack-year smoking history. She has never used smokeless tobacco. She reports that  drinks alcohol. She reports that she uses illicit drugs. Allergies:  Allergies  Allergen Reactions  . Codeine Itching    All over the body  . Penicillins Hives and Other (See Comments)    "Whelps" per patient.  . Hydralazine     02/28/11 Family unsure if this is allergy  for pt   Medications Prior to Admission  Medication Sig Dispense Refill  . albuterol (PROVENTIL) 90 MCG/ACT inhaler Inhale 2 puffs into the lungs every 6 (six) hours as needed. For wheezing       . budesonide-formoterol (SYMBICORT) 80-4.5 MCG/ACT inhaler Inhale 2 puffs into the lungs 2 (two) times daily.      . clopidogrel (PLAVIX) 75 MG tablet Take 75 mg by mouth daily.        . furosemide (LASIX) 40 MG tablet Take 40 mg by mouth daily.      . nitroGLYCERIN (NITROSTAT) 0.4 MG SL tablet Place 0.4 mg under the tongue every 5  (five) minutes as needed. For chest pain      . OLANZapine (ZYPREXA) 2.5 MG tablet Take 1 tablet (2.5 mg total) by mouth at bedtime.      . potassium chloride (K-DUR) 10 MEQ tablet Take 10 mEq by mouth daily.      . QUEtiapine (SEROQUEL) 50 MG tablet Take 50 mg by mouth daily.        Home: Home Living Family/patient expects to be discharged to:: Private residence Living Arrangements: Other relatives Available Help at Discharge: Family;Available 24 hours/day Type of Home: Apartment Home Access: Stairs to enter Entrance Stairs-Number of Steps: 1 Home Layout: One level Home Equipment: Bedside commode;Walker - 2 wheels;Cane - single point;Shower seat  Lives With: Family  Functional History: Prior Function Vocation: Retired Comments: walks inside with RW independently; outside with supervision Functional Status:  Mobility: Bed Mobility Bed Mobility: Supine to Sit;Sit to Supine Supine to Sit: 5: Supervision Sit to Supine: 5: Supervision Transfers Transfers: Sit to Stand;Stand to Sit Sit to Stand: 4: Min assist Stand to Sit: 4: Min assist Ambulation/Gait Ambulation/Gait Assistance: 3: Mod assist Ambulation Distance (Feet): 3 Feet Assistive device: Rolling walker Ambulation/Gait Assistance Details: pt ataxic with alternative amb pattern. pt easily fatigued. Pt with noted L LE tone Gait Pattern: Step-to pattern;Decreased stance time - left;Decreased step length - left;Decreased hip/knee flexion - left;Decreased dorsiflexion - right;Ataxic;Narrow base of support;Lateral trunk lean to left Gait velocity: slow General Gait Details: pt at increased falls risk. pt fatigue quickly requiring to sit down Stairs: No    ADL: ADL Grooming: Minimal assistance Where Assessed - Grooming: Unsupported sitting Upper Body Bathing: Minimal assistance Where Assessed - Upper Body Bathing: Unsupported sitting Lower Body Bathing: Moderate assistance Where Assessed - Lower Body Bathing: Supported sit  to stand Upper Body Dressing: Minimal assistance Where Assessed - Upper Body Dressing: Unsupported sitting Lower Body Dressing: Moderate assistance Where Assessed - Lower Body Dressing: Supported sit to Pharmacist, hospital: Moderate assistance Toilet Transfer Method: Stand pivot Toilet Transfer Equipment: Other (comment) (simulated) Equipment Used: Gait belt;Rolling walker Transfers/Ambulation Related to ADLs: min A sit - stand. MOd A for beginning mobility ADL Comments: demonstrates a funcitonal decline  Cognition: Cognition Overall Cognitive Status: No family/caregiver present to determine baseline cognitive functioning Arousal/Alertness: Awake/alert Orientation Level: Oriented to person;Oriented to place;Disoriented to time;Disoriented to situation Attention: Focused;Sustained Focused Attention: Impaired Focused Attention Impairment: Verbal basic;Functional basic Sustained Attention: Impaired Sustained Attention Impairment: Verbal basic;Functional basic Memory: Impaired Memory Impairment: Decreased recall of new information Awareness: Impaired Awareness Impairment: Intellectual impairment;Emergent impairment;Anticipatory impairment Problem Solving: Impaired Problem Solving Impairment: Verbal basic;Functional basic Executive Function: Reasoning;Sequencing;Self Monitoring Reasoning: Impaired Reasoning Impairment: Verbal basic;Functional basic Sequencing: Impaired Sequencing Impairment: Verbal basic;Functional basic Self Monitoring: Impaired Self Monitoring Impairment: Verbal basic;Functional basic Cognition Arousal/Alertness: Awake/alert Behavior During Therapy: Restless Overall Cognitive Status: No family/caregiver present to  determine baseline cognitive functioning Area of Impairment: Attention;Memory;Following commands;Safety/judgement;Awareness;Problem solving Current Attention Level: Sustained Memory: Decreased short-term memory Following Commands: Follows one step  commands consistently Safety/Judgement: Decreased awareness of safety;Decreased awareness of deficits Awareness: Emergent Problem Solving: Slow processing;Requires verbal cues  Blood pressure 131/80, pulse 102, temperature 97.3 F (36.3 C), temperature source Oral, resp. rate 20, height 5\' 1"  (1.549 m), weight 40 kg (88 lb 2.9 oz), SpO2 100.00%. Physical Exam  Vitals reviewed. Constitutional:  Frail elderly female appearing older than stated age.  HENT:  Head: Normocephalic.  Eyes: EOM are normal.  Neck: Normal range of motion. Neck supple. No thyromegaly present.  Pulmonary/Chest:  Decreased breath sounds clear to auscultation  Abdominal: Soft. Bowel sounds are normal. She exhibits no distension.  Neurological: She is alert.  Patient with significant dysarthria but most words intelligible with extra time. Left upper limb ataxia. Strength LUE is grossly 3+ to 4/5. LLE is 1-2/5 HF and KE  (apraxic?) and tr to 1 distally. Heel cord tight. She did follow simple commands. Patient oriented x3 to basic questions. Left facial weakness.   Skin: Skin is warm and dry.  Psychiatric: She has a normal mood and affect.  A little impulsive, basic insight and emergent awareness    Results for orders placed during the hospital encounter of 11/24/12 (from the past 24 hour(s))  GLUCOSE, CAPILLARY     Status: None   Collection Time    11/26/12  7:24 PM      Result Value Range   Glucose-Capillary 84  70 - 99 mg/dL  HEPARIN LEVEL (UNFRACTIONATED)     Status: Abnormal   Collection Time    11/26/12 10:25 PM      Result Value Range   Heparin Unfractionated 0.26 (*) 0.30 - 0.70 IU/mL  GLUCOSE, CAPILLARY     Status: Abnormal   Collection Time    11/26/12 10:51 PM      Result Value Range   Glucose-Capillary 118 (*) 70 - 99 mg/dL  GLUCOSE, CAPILLARY     Status: None   Collection Time    11/27/12  8:04 AM      Result Value Range   Glucose-Capillary 72  70 - 99 mg/dL  CBC     Status: Abnormal    Collection Time    11/27/12  9:05 AM      Result Value Range   WBC 3.4 (*) 4.0 - 10.5 K/uL   RBC 5.07  3.87 - 5.11 MIL/uL   Hemoglobin 15.4 (*) 12.0 - 15.0 g/dL   HCT 21.3  08.6 - 57.8 %   MCV 90.7  78.0 - 100.0 fL   MCH 30.4  26.0 - 34.0 pg   MCHC 33.5  30.0 - 36.0 g/dL   RDW 46.9  62.9 - 52.8 %   Platelets 192  150 - 400 K/uL  GLUCOSE, CAPILLARY     Status: Abnormal   Collection Time    11/27/12 11:55 AM      Result Value Range   Glucose-Capillary 100 (*) 70 - 99 mg/dL   Ct Head Wo Contrast  11/25/2012   CLINICAL DATA:  Status post tPA dose  EXAM: CT HEAD WITHOUT CONTRAST  TECHNIQUE: Contiguous axial images were obtained from the base of the skull through the vertex without intravenous contrast.  COMPARISON:  11/24/2012  FINDINGS: The bony calvarium is again intact. There again noted changes of prior infarcts bilaterally stable from the prior exam. No acute hemorrhage is identified. Chronic white matter ischemic  and atrophic changes are seen. New rounded area of hypodensity is noted in the left cerebellum consistent with the recent infarct. This is new from the prior exam.  IMPRESSION: Acute left cerebellar infarct with some progression from the prior exam. . The hypodensity measures approximately 2.8 x 1.8 cm in greatest transverse and AP views dimensions respectively. This was not well seen on the recent CT examination but was identified on the previous MRI.  No acute hemorrhage is seen.   Electronically Signed   By: Alcide Clever M.D.   On: 11/25/2012 19:21   Dg Swallowing Func-speech Pathology  11/26/2012   Riley Nearing [place]ois, CCC-SLP     11/26/2012  1:54 PM Objective Swallowing Evaluation: Modified Barium Swallowing Study   Patient Details  Name: [name] MRN: [id number] Date of Birth: 06-27-1945  Today's Date: 11/26/2012 Time: 1130-1205 SLP Time Calculation (min): 35 min  Past Medical History:  Past Medical History  Diagnosis Date  . Weight loss, unintentional   . Trouble  swallowing   . Change in voice   . Substance abuse   . Dementia   . Asthma   . Shortness of breath   . Seizures     "    It has been along time "  . CHF (congestive heart failure)   . Headache(784.0)   . Hypertension   . Arthralgia   . Tremors of nervous system   . Anxiety   . Rib fractures     hx of May 2012  . Heart attack 05/21/10  . Schizophrenia   . Coronary artery disease   . Stroke 07/11/10    hx of R CVA   . COPD (chronic obstructive pulmonary disease)   . Obstructive sleep apnea   . Breast cancer 01/28/11    L breast, inv ductal/in situ, ER/PR -, Her2 -   Past Surgical History:  Past Surgical History  Procedure Laterality Date  . Total abdominal hysterectomy  age 4   HPI:  [name] is a 67 y.o. female who was apparently eating a  english muffin earlier when she became suddenly dysarthric. It is  unclear if she had any other changes earlier. She has had  multiple strokes in the past and volunteers that she is not  taking any medications.  PMH significiant for seizures, dementia,  substance abuse, HTN COPD, and non-ischemic cardiomyopathy.      Assessment / Plan / Recommendation Clinical Impression  Dysphagia Diagnosis: Moderate oral phase dysphagia;Moderate  pharyngeal phase dysphagia Clinical impression: Pt presents with a primary oral dysphagia  with limited coordinated movement for bolus formation. There is a  thrusting movment for propulsion that leads to premature spillage  with delayed swallow initiation. Moderate residuals remain post  swallow, primarily in right buccal cavity that are likely to  spill to pharynx post swallow. There was one instance of silent  aspiration before the swallow due to particularly rapid, forceful  transit. Thin liquids were consistently aspirated. Otherwise  nectar thick liquids and pureed solids appear to be safest diet.  Pt will need reminders to clear right side and swallow again.  Capacity to recall and independently use this strategy appear  limited. Suggest  frequent oral suction to right side during meals  as well.     Treatment Recommendation  Therapy as outlined in treatment plan below    Diet Recommendation Dysphagia 1 (Puree);Nectar-thick liquid   Liquid Administration via: Cup Medication Administration: Whole meds with puree Supervision: Full supervision/cueing for  compensatory strategies Compensations: Slow rate;Small sips/bites;Check for  pocketing;Check for anterior loss;Multiple dry swallows after  each bite/sip Postural Changes and/or Swallow Maneuvers: Seated upright 90  degrees;Upright 30-60 min after meal    Other  Recommendations Oral Care Recommendations: Oral care  before and after PO Other Recommendations: Order thickener from pharmacy   Follow Up Recommendations  Skilled Nursing facility    Frequency and Duration min 2x/week  2 weeks   Pertinent Vitals/Pain NA    SLP Swallow Goals Patient will utilize recommended strategies during swallow to  increase swallowing safety with: Moderate assistance Swallow Study Goal #2 - Progress: Progressing toward goal   General HPI: [name] is a 67 y.o. female who was  apparently eating a english muffin earlier when she became  suddenly dysarthric. It is unclear if she had any other changes  earlier. She has had multiple strokes in the past and volunteers  that she is not taking any medications.  PMH significiant for  seizures, dementia, substance abuse, HTN COPD, and non-ischemic  cardiomyopathy.  Type of Study: Modified Barium Swallowing Study Reason for Referral: Objectively evaluate swallowing function Previous Swallow Assessment: MBS 2012 - Dys 1/nectar Diet Prior to this Study: NPO Temperature Spikes Noted: No Respiratory Status: Room air History of Recent Intubation: No Behavior/Cognition: Alert;Cooperative;Confused;Requires  cueing;Decreased sustained attention Oral Cavity - Dentition: Dentures, top;Missing dentition Oral Motor / Sensory Function: Impaired - see Bedside swallow  eval Self-Feeding  Abilities: Needs assist Patient Positioning: Upright in chair Baseline Vocal Quality: Low vocal intensity Volitional Cough: Weak Volitional Swallow: Able to elicit Anatomy: Within functional limits Pharyngeal Secretions: Not observed secondary MBS    Reason for Referral Objectively evaluate swallowing function   Oral Phase Oral Preparation/Oral Phase Oral Phase: Impaired Oral Phase - Comment Oral Phase - Comment: Oral residual particularly in right buccal  cavity, lingual thrusting for bolus formation. Significant  residuals with mechanical soft texture.    Pharyngeal Phase Pharyngeal Phase Pharyngeal Phase: Impaired Pharyngeal - Nectar Pharyngeal - Nectar Teaspoon: Delayed swallow  initiation;Premature spillage to valleculae Pharyngeal - Nectar Cup: Delayed swallow initiation;Premature  spillage to valleculae;Penetration/Aspiration before  swallow;Trace aspiration Penetration/Aspiration details (nectar cup): Material enters  airway, passes BELOW cords without attempt by patient to eject  out (silent aspiration);Material does not enter airway Pharyngeal - Nectar Straw: Premature spillage to pyriform  sinuses;Delayed swallow initiation Pharyngeal - Thin Pharyngeal - Thin Cup: Delayed swallow initiation;Premature  spillage to pyriform sinuses;Penetration/Aspiration before  swallow Penetration/Aspiration details (thin cup): Material enters  airway, passes BELOW cords without attempt by patient to eject  out (silent aspiration) Pharyngeal - Solids Pharyngeal - Puree: Delayed swallow initiation;Premature spillage  to valleculae Pharyngeal - Mechanical Soft: Delayed swallow  initiation;Premature spillage to valleculae  Cervical Esophageal Phase    GO    Cervical Esophageal Phase Cervical Esophageal Phase: [facility]        Harlon Ditty, MA CCC-SLP [phone]  DeBlois, Riley Nearing 11/26/2012, 1:53 PM     Assessment/Plan: Diagnosis: embolic cva right posterior temporal and left cerebellum 1. Does the need for close, 24  hr/day medical supervision in concert with the patient's rehab needs make it unreasonable for this patient to be served in a less intensive setting? Yes 2. Co-Morbidities requiring supervision/potential complications: copd, chf, cad, breast cancer, prior cva 3. Due to bladder management, bowel management, safety, skin/wound care, disease management, medication administration, pain management and patient education, does the patient require 24 hr/day rehab nursing? Yes 4. Does the patient require coordinated care of  a physician, rehab nurse, PT (1-2 hrs/day, 5 days/week), OT (1-2 hrs/day, 5 days/week) and SLP (1-2 hrs/day, 5 days/week) to address physical and functional deficits in the context of the above medical diagnosis(es)? Yes Addressing deficits in the following areas: balance, endurance, locomotion, strength, transferring, bowel/bladder control, bathing, dressing, feeding, grooming, toileting, cognition, speech, language, swallowing and psychosocial support 5. Can the patient actively participate in an intensive therapy program of at least 3 hrs of therapy per day at least 5 days per week? Yes 6. The potential for patient to make measurable gains while on inpatient rehab is good 7. Anticipated functional outcomes upon discharge from inpatient rehab are supervision to min assist with PT, supervision to min+ assist with OT, supervision to min assist with SLP. 8. Estimated rehab length of stay to reach the above functional goals is: 12-17 days 9. Does the patient have adequate social supports to accommodate these discharge functional goals? Potentially 10. Anticipated D/C setting: Home 11. Anticipated post D/C treatments: HH therapy 12. Overall Rehab/Functional Prognosis: good  RECOMMENDATIONS: This patient's condition is appropriate for continued rehabilitative care in the following setting: CIR Patient has agreed to participate in recommended program. Yes Note that insurance prior authorization  may be required for reimbursement for recommended care.  Comment: Would like to follow up on her baseline functional status but I think she would likely benefit from our inpatient rehab program. Rehab RN to follow up.   Ranelle Oyster, MD, [place] [facility]     11/27/2012

## 2012-11-27 NOTE — Progress Notes (Signed)
Subjective:  Patient denies any chest pain or shortness of breath. Speech slightly improved  Objective:  Vital Signs in the last 24 hours: Temp:  [97.2 F (36.2 C)-97.8 F (36.6 C)] 97.3 F (36.3 C) (10/07 1040) Pulse Rate:  [26-102] 102 (10/07 1040) Resp:  [10-25] 20 (10/07 1040) BP: (105-155)/(59-121) 131/80 mmHg (10/07 1040) SpO2:  [98 %-100 %] 100 % (10/07 1040)  Intake/Output from previous day: 10/06 0701 - 10/07 0700 In: 1498.7 [P.O.:320; I.V.:1178.7] Out: 575 [Urine:575] Intake/Output from this shift: Total I/O In: 406.5 [P.O.:240; I.V.:166.5] Out: -   Physical Exam: Neck: no adenopathy, no carotid bruit, no JVD and supple, symmetrical, trachea midline Lungs: Decreased breath sound at bases with occasional expiratory wheezing Heart: regular rate and rhythm, S1, S2 normal and Soft systolic murmur noted Abdomen: soft, non-tender; bowel sounds normal; no masses,  no organomegaly Extremities: extremities normal, atraumatic, no cyanosis or edema  Lab Results:  Recent Labs  11/24/12 1729 11/24/12 1737 11/27/12 0905  WBC 4.7  --  3.4*  HGB 15.1* 16.7* 15.4*  PLT 233  --  192    Recent Labs  11/24/12 1729 11/24/12 1737  NA 144 145  K 3.4* 3.4*  CL 103 107  CO2 25  --   GLUCOSE 145* 149*  BUN 20 20  CREATININE 1.17* 1.30*    Recent Labs  11/24/12 1729 11/24/12 1853  TROPONINI 2.88* 2.53*   Hepatic Function Panel  Recent Labs  11/24/12 1729  PROT 8.1  ALBUMIN 3.9  AST 34  ALT 8  ALKPHOS 93  BILITOT 0.6    Recent Labs  11/25/12 0401  CHOL 175   No results found for this basename: PROTIME,  in the last 72 hours  Imaging: Imaging results have been reviewed and Ct Head Wo Contrast  11/25/2012   CLINICAL DATA:  Status post tPA dose  EXAM: CT HEAD WITHOUT CONTRAST  TECHNIQUE: Contiguous axial images were obtained from the base of the skull through the vertex without intravenous contrast.  COMPARISON:  11/24/2012  FINDINGS: The bony calvarium  is again intact. There again noted changes of prior infarcts bilaterally stable from the prior exam. No acute hemorrhage is identified. Chronic white matter ischemic and atrophic changes are seen. New rounded area of hypodensity is noted in the left cerebellum consistent with the recent infarct. This is new from the prior exam.  IMPRESSION: Acute left cerebellar infarct with some progression from the prior exam. . The hypodensity measures approximately 2.8 x 1.8 cm in greatest transverse and AP views dimensions respectively. This was not well seen on the recent CT examination but was identified on the previous MRI.  No acute hemorrhage is seen.   Electronically Signed   By: Alcide Clever M.D.   On: 11/25/2012 19:21   Dg Swallowing Func-speech Pathology  11/26/2012   Riley Nearing Deblois, CCC-SLP     11/26/2012  1:54 PM Objective Swallowing Evaluation: Modified Barium Swallowing Study   Patient Details  Name: [name] MRN: [id number] Date of Birth: 02/28/1945  Today's Date: 11/26/2012 Time: 1130-1205 SLP Time Calculation (min): 35 min  Past Medical History:  Past Medical History  Diagnosis Date  . Weight loss, unintentional   . Trouble swallowing   . Change in voice   . Substance abuse   . Dementia   . Asthma   . Shortness of breath   . Seizures     "    It has been along time "  . CHF (congestive  heart failure)   . Headache(784.0)   . Hypertension   . Arthralgia   . Tremors of nervous system   . Anxiety   . Rib fractures     hx of May 2012  . Heart attack 05/21/10  . Schizophrenia   . Coronary artery disease   . Stroke 07/11/10    hx of R CVA   . COPD (chronic obstructive pulmonary disease)   . Obstructive sleep apnea   . Breast cancer 01/28/11    L breast, inv ductal/in situ, ER/PR -, Her2 -   Past Surgical History:  Past Surgical History  Procedure Laterality Date  . Total abdominal hysterectomy  age 68   HPI:  [name] is a 67 y.o. female who was apparently eating a  english muffin earlier when she  became suddenly dysarthric. It is  unclear if she had any other changes earlier. She has had  multiple strokes in the past and volunteers that she is not  taking any medications.  PMH significiant for seizures, dementia,  substance abuse, HTN COPD, and non-ischemic cardiomyopathy.      Assessment / Plan / Recommendation Clinical Impression  Dysphagia Diagnosis: Moderate oral phase dysphagia;Moderate  pharyngeal phase dysphagia Clinical impression: Pt presents with a primary oral dysphagia  with limited coordinated movement for bolus formation. There is a  thrusting movment for propulsion that leads to premature spillage  with delayed swallow initiation. Moderate residuals remain post  swallow, primarily in right buccal cavity that are likely to  spill to pharynx post swallow. There was one instance of silent  aspiration before the swallow due to particularly rapid, forceful  transit. Thin liquids were consistently aspirated. Otherwise  nectar thick liquids and pureed solids appear to be safest diet.  Pt will need reminders to clear right side and swallow again.  Capacity to recall and independently use this strategy appear  limited. Suggest frequent oral suction to right side during meals  as well.     Treatment Recommendation  Therapy as outlined in treatment plan below    Diet Recommendation Dysphagia 1 (Puree);Nectar-thick liquid   Liquid Administration via: Cup Medication Administration: Whole meds with puree Supervision: Full supervision/cueing for compensatory strategies Compensations: Slow rate;Small sips/bites;Check for  pocketing;Check for anterior loss;Multiple dry swallows after  each bite/sip Postural Changes and/or Swallow Maneuvers: Seated upright 90  degrees;Upright 30-60 min after meal    Other  Recommendations Oral Care Recommendations: Oral care  before and after PO Other Recommendations: Order thickener from pharmacy   Follow Up Recommendations  Skilled Nursing facility    Frequency and Duration  min 2x/week  2 weeks   Pertinent Vitals/Pain NA    SLP Swallow Goals Patient will utilize recommended strategies during swallow to  increase swallowing safety with: Moderate assistance Swallow Study Goal #2 - Progress: Progressing toward goal   General HPI: [name] is a 67 y.o. female who was  apparently eating a english muffin earlier when she became  suddenly dysarthric. It is unclear if she had any other changes  earlier. She has had multiple strokes in the past and volunteers  that she is not taking any medications.  PMH significiant for  seizures, dementia, substance abuse, HTN COPD, and non-ischemic  cardiomyopathy.  Type of Study: Modified Barium Swallowing Study Reason for Referral: Objectively evaluate swallowing function Previous Swallow Assessment: MBS 2012 - Dys 1/nectar Diet Prior to this Study: NPO Temperature Spikes Noted: No Respiratory Status: Room air History of Recent Intubation:  No Behavior/Cognition: Alert;Cooperative;Confused;Requires  cueing;Decreased sustained attention Oral Cavity - Dentition: Dentures, top;Missing dentition Oral Motor / Sensory Function: Impaired - see Bedside swallow  eval Self-Feeding Abilities: Needs assist Patient Positioning: Upright in chair Baseline Vocal Quality: Low vocal intensity Volitional Cough: Weak Volitional Swallow: Able to elicit Anatomy: Within functional limits Pharyngeal Secretions: Not observed secondary MBS    Reason for Referral Objectively evaluate swallowing function   Oral Phase Oral Preparation/Oral Phase Oral Phase: Impaired Oral Phase - Comment Oral Phase - Comment: Oral residual particularly in right buccal  cavity, lingual thrusting for bolus formation. Significant  residuals with mechanical soft texture.    Pharyngeal Phase Pharyngeal Phase Pharyngeal Phase: Impaired Pharyngeal - Nectar Pharyngeal - Nectar Teaspoon: Delayed swallow  initiation;Premature spillage to valleculae Pharyngeal - Nectar Cup: Delayed swallow  initiation;Premature  spillage to valleculae;Penetration/Aspiration before  swallow;Trace aspiration Penetration/Aspiration details (nectar cup): Material enters  airway, passes BELOW cords without attempt by patient to eject  out (silent aspiration);Material does not enter airway Pharyngeal - Nectar Straw: Premature spillage to pyriform  sinuses;Delayed swallow initiation Pharyngeal - Thin Pharyngeal - Thin Cup: Delayed swallow initiation;Premature  spillage to pyriform sinuses;Penetration/Aspiration before  swallow Penetration/Aspiration details (thin cup): Material enters  airway, passes BELOW cords without attempt by patient to eject  out (silent aspiration) Pharyngeal - Solids Pharyngeal - Puree: Delayed swallow initiation;Premature spillage  to valleculae Pharyngeal - Mechanical Soft: Delayed swallow  initiation;Premature spillage to valleculae  Cervical Esophageal Phase    GO    Cervical Esophageal Phase Cervical Esophageal Phase: [facility]        Harlon Ditty, MA CCC-SLP [phone]  DeBlois, Riley Nearing 11/26/2012, 1:53 PM     Cardiac Studies:  Assessment/Plan:  Status post acute left cerebellar and right posterior temporal infarct status post TPA probably cardioembolic  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

## 2012-11-27 NOTE — Progress Notes (Addendum)
ANTICOAGULATION CONSULT NOTE - Follow Up Consult  Pharmacy Consult for Heparin and warfarin Indication: cardioembolic CVA with apical thrombus  Allergies  Allergen Reactions  . Codeine Itching    All over the body  . Penicillins Hives and Other (See Comments)    "Whelps" per patient.  . Hydralazine     02/28/11 Family unsure if this is allergy for pt    Patient Measurements: Height: 5\' 1"  (154.9 cm) Weight: 88 lb 2.9 oz (40 kg) IBW/kg (Calculated) : 47.8  Vital Signs: Temp: 97.3 F (36.3 C) (10/07 1040) Temp src: Oral (10/07 1040) BP: 131/80 mmHg (10/07 1040) Pulse Rate: 102 (10/07 1040)  Labs:  Recent Labs  11/24/12 1729 11/24/12 1737 11/24/12 1853 11/26/12 1025 11/26/12 2225 11/27/12 0905 11/27/12 1223  HGB 15.1* 16.7*  --   --   --  15.4*  --   HCT 45.2 49.0*  --   --   --  46.0  --   PLT 233  --   --   --   --  192  --   APTT 33  --   --   --   --   --   --   LABPROT 13.2  --   --  15.7*  --   --  17.3*  INR 1.02  --   --  1.28  --   --  1.45  HEPARINUNFRC  --   --   --   --  0.26*  --  0.43  CREATININE 1.17* 1.30*  --   --   --   --   --   TROPONINI 2.88*  --  2.53*  --   --   --   --     Estimated Creatinine Clearance: 26.5 ml/min (by C-G formula based on Cr of 1.3).  Medications:  Prescriptions prior to admission  Medication Sig Dispense Refill  . albuterol (PROVENTIL) 90 MCG/ACT inhaler Inhale 2 puffs into the lungs every 6 (six) hours as needed. For wheezing       . budesonide-formoterol (SYMBICORT) 80-4.5 MCG/ACT inhaler Inhale 2 puffs into the lungs 2 (two) times daily.      . clopidogrel (PLAVIX) 75 MG tablet Take 75 mg by mouth daily.        . furosemide (LASIX) 40 MG tablet Take 40 mg by mouth daily.      . nitroGLYCERIN (NITROSTAT) 0.4 MG SL tablet Place 0.4 mg under the tongue every 5 (five) minutes as needed. For chest pain      . OLANZapine (ZYPREXA) 2.5 MG tablet Take 1 tablet (2.5 mg total) by mouth at bedtime.      . potassium chloride  (K-DUR) 10 MEQ tablet Take 10 mEq by mouth daily.      . QUEtiapine (SEROQUEL) 50 MG tablet Take 50 mg by mouth daily.        Assessment: 67 yo female who presented to the ED on 10/4 with dysarthria and slurred speech and was given tPA at for confirmed infarcts on MRI. 2D echo also reports a small apical thrombus. Pharmacy consulted to begin a heparin drip as a bridge to warfarin. INR increased to 1.45 today. Heparin level was drawn about 4 hours late, but was within acceptable range at 0.43units/mL. CBC is stable and no bleeding noted.  Goal of Therapy:  INR 2-3 Heparin level 0.3-0.5 units/ml Monitor platelets by anticoagulation protocol: Yes   Plan:  1. Cont Heparin 550 units/hr IV  2. warfarin 4mg  PO  tonight 3. Confirmatory heparin level in 8 hours 4.Daily INR, CBC, and heparin level 5. Monitor for s/sx of bleeding  Ceazia Harb D. Richrd Kuzniar, PharmD Clinical Pharmacist Pager: [phone] 11/27/2012 1:55 PM

## 2012-11-27 NOTE — Progress Notes (Signed)
Speech Language Pathology Dysphagia Treatment Patient Details Name: [name] MRN: [id number] DOB: 1945/07/17 Today's Date: 11/27/2012 Time: 3086-5784 SLP Time Calculation (min): 33 min  Assessment / Plan / Recommendation Clinical Impression  SLP offered moderate to max verbal cues and model for increased intelligibility at phrase level and utilization of compensatory strategies for improved listener comprehension. Pt also needed model and verbal cues for second swallow, appropriate placement of straw for sips, posture for PO intake and suction to oral cavity. Overall pt improving but carry over for instruction will be difficult as pt has baseline cognitive and memory deficits.     Diet Recommendation       SLP Plan Continue with current plan of care   Pertinent Vitals/Pain NA   Swallowing Goals     General Temperature Spikes Noted: No Respiratory Status: Room air Behavior/Cognition: Alert;Cooperative;Confused;Requires cueing;Decreased sustained attention Oral Cavity - Dentition: Dentures, top;Missing dentition Patient Positioning: Upright in bed  Oral Cavity - Oral Hygiene Does patient have any of the following "at risk" factors?: Other - dysphagia Patient is AT RISK - Oral Care Protocol followed (see row info): Yes   Dysphagia Treatment     GO    Harlon Ditty, MA CCC-SLP [phone]  Claudine Mouton 11/27/2012, 3:00 PM

## 2012-11-27 NOTE — Progress Notes (Signed)
VASCULAR LAB PRELIMINARY  PRELIMINARY  PRELIMINARY  PRELIMINARY  Carotid duplex completed.    Preliminary report:  Bilateral:  1-39% ICA stenosis.  Vertebral artery flow is antegrade.     Andry Bogden, RVS 11/27/2012, 3:49 PM

## 2012-11-27 NOTE — Progress Notes (Signed)
Occupational Therapy Evaluation Patient Details Name: [name] MRN: [id number] DOB: 05-17-1945 Today's Date: 11/27/2012 Time: 4098-1191 OT Time Calculation (min): 23 min  OT Assessment / Plan / Recommendation History of present illness   67 y.o. female who was apparently eating a english muffin earlier when she became suddenly dysarthric.She has had multiple strokes in the past. MRI confirms two recent infarcts. She does endorse vision changes, and says that they started at the same time as her other symptoms. She currently is very dysarthric, and very ataxic on the left.  TPA completed at 20:15 Acute infarction affects the left cerebellum and right posterior temporal regions.     Clinical Impression   PTA, pt lived with family. Pt reports being mod I with ADL and mobility.Pt with decline in functional status compared to her baseline. Feel pt can benefit from CIR to return to PLOF and D/C home with family with 24/7 S. Pt will benefit from skilled OT services to facilitate D/C to next venue due to below deficits.    OT Assessment  Patient needs continued OT Services    Follow Up Recommendations  CIR    Barriers to Discharge      Equipment Recommendations  None recommended by OT    Recommendations for Other Services Rehab consult  Frequency  Min 2X/week    Precautions / Restrictions Precautions Precautions: Fall   Pertinent Vitals/Pain Vitals stable    ADL  Grooming: Minimal assistance Where Assessed - Grooming: Unsupported sitting Upper Body Bathing: Minimal assistance Where Assessed - Upper Body Bathing: Unsupported sitting Lower Body Bathing: Moderate assistance Where Assessed - Lower Body Bathing: Supported sit to stand Upper Body Dressing: Minimal assistance Where Assessed - Upper Body Dressing: Unsupported sitting Lower Body Dressing: Moderate assistance Where Assessed - Lower Body Dressing: Supported sit to Pharmacist, hospital: Moderate assistance Toilet  Transfer Method: Stand pivot Toilet Transfer Equipment: Other (comment) (simulated) Toileting - Clothing Manipulation and Hygiene: Minimal assistance Where Assessed - Toileting Clothing Manipulation and Hygiene: Sit to stand from 3-in-1 or toilet Equipment Used: Gait belt;Rolling walker Transfers/Ambulation Related to ADLs: min A sit - stand. MOd A for beginning mobility ADL Comments: demonstrates a funcitonal decline    OT Diagnosis: Generalized weakness;Cognitive deficits;Disturbance of vision;Ataxia  OT Problem List: Decreased strength;Decreased range of motion;Decreased activity tolerance;Impaired balance (sitting and/or standing);Impaired vision/perception;Decreased coordination;Decreased cognition;Decreased safety awareness;Cardiopulmonary status limiting activity;Impaired tone;Impaired UE functional use OT Treatment Interventions: Self-care/ADL training;Therapeutic exercise;Neuromuscular education;DME and/or AE instruction;Therapeutic activities;Cognitive remediation/compensation;Visual/perceptual remediation/compensation;Patient/family education;Balance training   OT Goals(Current goals can be found in the care plan section) Acute Rehab OT Goals Patient Stated Goal: go home OT Goal Formulation: With patient Time For Goal Achievement: 12/11/12 Potential to Achieve Goals: Good  Visit Information  Last OT Received On: 11/27/12 Assistance Needed: +2 PT/OT Co-Evaluation/Treatment: Yes (skilled intervention needed) Reason Eval/Treat Not Completed: Medical issues which prohibited therapy (Pt remains on strict bedrest)       Prior Functioning     Home Living Family/patient expects to be discharged to:: Private residence Living Arrangements: Other relatives Available Help at Discharge: Family;Available 24 hours/day Type of Home: Apartment Home Access: Stairs to enter Entrance Stairs-Number of Steps: 1 Home Layout: One level Home Equipment: Bedside commode;Walker - 2 wheels;Cane -  single point;Shower seat  Lives With: Family Prior Function Level of Independence: Independent with assistive device(s) Comments: walks inside with RW independently; outside with supervision Communication Communication: Expressive difficulties Dominant Hand: Right         Vision/Perception Vision -  History Patient Visual Report: No change from baseline Vision - Assessment Eye Alignment: Within Functional Limits Vision Assessment:  (will further assess. Appears to demonstrte L field cut) Praxis Praxis: Intact   Cognition  Cognition Arousal/Alertness: Awake/alert Behavior During Therapy: Restless Overall Cognitive Status: No family/caregiver present to determine baseline cognitive functioning Area of Impairment: Attention;Memory;Following commands;Safety/judgement;Awareness;Problem solving Current Attention Level: Sustained Memory: Decreased short-term memory Following Commands: Follows one step commands consistently Safety/Judgement: Decreased awareness of safety;Decreased awareness of deficits Awareness: Emergent Problem Solving: Slow processing;Requires verbal cues    Extremity/Trunk Assessment Upper Extremity Assessment Upper Extremity Assessment: RUE deficits/detail;LUE deficits/detail RUE Deficits / Details: RUE ataxia, but functional.  RUE Coordination: decreased fine motor;decreased gross motor (but funcitonal) LUE Deficits / Details: ataxia. AROM WNL. ataxia worse than RUE LUE Coordination: decreased fine motor;decreased gross motor Lower Extremity Assessment Lower Extremity Assessment: RLE deficits/detail;LLE deficits/detail RLE Deficits / Details: generalized weakness LLE Deficits / Details: LLE increased extensor tone. Ataxic. decreased funcitonal ue complared to baseline Cervical / Trunk Assessment Cervical / Trunk Assessment: Other exceptions (R bias)     Mobility Bed Mobility Bed Mobility: Supine to Sit;Sit to Supine Supine to Sit: 5: Supervision Sit  to Supine: 5: Supervision Transfers Transfers: Sit to Stand;Stand to Sit Sit to Stand: 4: Min assist Stand to Sit: 4: Min assist Details for Transfer Assistance: vc for safety. pt imipulsive     Exercise     Balance Balance Balance Assessed: Yes Static Sitting Balance Static Sitting - Balance Support: Feet supported Static Sitting - Level of Assistance: 6: Modified independent (Device/Increase time) Static Standing Balance Static Standing - Balance Support: Bilateral upper extremity supported Static Standing - Level of Assistance: 4: Min assist Dynamic Standing Balance Dynamic Standing - Balance Support: Bilateral upper extremity supported;During functional activity Dynamic Standing - Level of Assistance: 3: Mod assist   End of Session OT - End of Session Equipment Utilized During Treatment: Gait belt;Rolling walker Activity Tolerance: Patient tolerated treatment well Patient left: in bed;with call bell/phone within reach;with nursing/sitter in room Nurse Communication: Mobility status  GO     Mckaylin Bastien,HILLARY 11/27/2012, 12:12 PM [facility], OTR/L  [phone] 11/27/2012

## 2012-11-27 NOTE — Progress Notes (Signed)
[facility]  CONSULT SERVICE NOTE  Pt away for test. I have been on the unit for over 30 minutes, and she hasn't returned. Will check on her tomorrow.  Ranelle Oyster, MD, [facility]

## 2012-11-27 NOTE — Progress Notes (Signed)
Stroke Team Progress Note  HISTORY [name] is a 67 y.o. female who was apparently eating a english muffin earlier when she became suddenly dysarthric. It is unclear if she had any other changes earlier. She has had multiple strokes in the past and volunteers that she is not taking any medications.  Her history was very unclear initially and therefore she was taken to MRI which confirms two recent infarcts. She does endorse vision changes, and says that they started at the same time as her other symptoms. She currently is very dysarthric, and very ataxic on the left.  Initially, time of onset was very unclear and I was unable to get a hold of family. Finally after repeated attempts I was able to get a hold her niece that Saw her onset and was able to confirm time of onset TPA completed at 20:15  Pt is L hand dominant.    SUBJECTIVE  Patient remains dysarthric. No chest pain at this time. Enzymes + for MI. Last echo EF 10-15%. + TPA. Now coumadin. Hemodynamically stable.    OBJECTIVE Most recent Vital Signs: Filed Vitals:   11/27/12 0400 11/27/12 0500 11/27/12 0600 11/27/12 0700  BP: 128/79 126/72 121/79 136/80  Pulse: 41 43 48 38  Temp: 97.8 F (36.6 C)     TempSrc: Oral     Resp: 17 14 18 10   Height:      Weight:      SpO2: 100% 100% 98% 100%   CBG (last 3)   Recent Labs  11/26/12 1211 11/26/12 1924 11/26/12 2251  GLUCAP 84 84 118*    IV Fluid Intake:   . sodium chloride 50 mL/hr at 11/26/12 1610  . heparin 550 Units/hr (11/26/12 2337)    MEDICATIONS  . antiseptic oral rinse  15 mL Mouth Rinse q12n4p  . chlorhexidine  15 mL Mouth Rinse BID  . pantoprazole  40 mg Oral QHS  . Warfarin - Pharmacist Dosing Inpatient   Does not apply q1800   PRN:  acetaminophen, acetaminophen, labetalol  Diet:  Dysphagia  Activity:  Bedrest assistance DVT Prophylaxis:  IV heparin  CLINICALLY SIGNIFICANT STUDIES Basic Metabolic Panel:   Recent Labs Lab 11/24/12 1729  11/24/12 1737  NA 144 145  K 3.4* 3.4*  CL 103 107  CO2 25  --   GLUCOSE 145* 149*  BUN 20 20  CREATININE 1.17* 1.30*  CALCIUM 9.8  --    Liver Function Tests:   Recent Labs Lab 11/24/12 1729  AST 34  ALT 8  ALKPHOS 93  BILITOT 0.6  PROT 8.1  ALBUMIN 3.9   CBC:   Recent Labs Lab 11/24/12 1729 11/24/12 1737  WBC 4.7  --   NEUTROABS 1.6*  --   HGB 15.1* 16.7*  HCT 45.2 49.0*  MCV 91.5  --   PLT 233  --    Coagulation:   Recent Labs Lab 11/24/12 1729 11/26/12 1025  LABPROT 13.2 15.7*  INR 1.02 1.28   Cardiac Enzymes:   Recent Labs Lab 11/24/12 1729 11/24/12 1853  TROPONINI 2.88* 2.53*   Urinalysis:   Recent Labs Lab 11/24/12 1950  COLORURINE YELLOW  LABSPEC 1.020  PHURINE 6.0  GLUCOSEU NEGATIVE  HGBUR NEGATIVE  BILIRUBINUR NEGATIVE  KETONESUR 15*  PROTEINUR 30*  UROBILINOGEN 1.0  NITRITE NEGATIVE  LEUKOCYTESUR SMALL*   Drugs of Abuse     Component Value Date/Time   LABOPIA NONE DETECTED 11/24/2012 1950   LABOPIA NEGATIVE 04/01/2009 1007   COCAINSCRNUR NONE  DETECTED 11/24/2012 1950   COCAINSCRNUR  Value: POSITIVE (NOTE) Result repeated and verified. Sent for confirmatory testing* 04/01/2009 1007   LABBENZ NONE DETECTED 11/24/2012 1950   LABBENZ NEGATIVE 04/01/2009 1007   AMPHETMU NONE DETECTED 11/24/2012 1950   AMPHETMU NEGATIVE 04/01/2009 1007   THCU NONE DETECTED 11/24/2012 1950   LABBARB NONE DETECTED 11/24/2012 1950     Lipid Panel    Component Value Date/Time   CHOL 175 11/25/2012 0401   TRIG 59 11/25/2012 0401   HDL 78 11/25/2012 0401   CHOLHDL 2.2 11/25/2012 0401   VLDL 12 11/25/2012 0401   LDLCALC 85 11/25/2012 0401   HgbA1C  Lab Results  Component Value Date   HGBA1C 6.2* 11/25/2012    Urine Drug Screen:    Alcohol Level:   Recent Labs Lab 11/24/12 1729  ETH <11    Ct Head Wo Contrast 11/25/2012  : Acute left cerebellar infarct with some progression from the prior exam. . The hypodensity measures approximately 2.8 x 1.8 cm  in greatest transverse and AP views dimensions respectively. This was not well seen on the recent CT examination but was identified on the previous MRI.  No acute hemorrhage is seen.  11/24/2012  : 1. No evidence of acute cortical infarction.  2. No evidence of intracranial hemorrhage.  3. Multiple remote cortical hemispheric infarctions are again demonstrated.  4.  Atrophy and chronic microvascular disease again demonstrated.   Mr Brain Wo Contrast 11/24/2012 Acute infarction affects the left cerebellum and right posterior temporal regions.   Dg Chest Port 1 View 11/24/2012   : No active disease.      Mr Maxine Glenn Head/brain Wo Cm 11/25/2012   No proximal ICA, MCA, basilar, or vertebral flow reducing lesion. Mild intracranial atherosclerotic change of the distal MCA vessels may have slightly progressed from 2013. 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       CT of the brain  No evidence of acute cortical infarction.  MRI of the brain  Acute infarction affects the left cerebellum and right posterior  temporal regions.   2D Echocardiogram  06/25/2012 Left ventricle: The cavity size was moderately dilated. The estimated ejection fraction was 10%. Diffuse hypokinesis. There was spontaneous echo contrast, indicative of stasis.- Mitral valve: Mild regurgitation.- Atrial septum: No defect or patent foramen ovale was  Identified 11/25/2012 Left ventricle: The cavity size was moderately dilated.The estimated ejection fraction was 15%, in the range of 10% to 15%. Wall motion was normal; there were no regional wall motion abnormalities. There was a thrombus. There was a small, apical thrombus. Atrial septum: No defect or patent foramen ovale was identified.  Carotid Doppler    EKG  normal EKG, normal sinus rhythm, unchanged from previous tracings.   Therapy Recommendations SNF (has also failed swallow)  Physical Exam   General: in bed, appears nauseous.  CV: RRR  Abd: Nontender nondistended  Respiratory:  Clear to auscultation  Extremities: No edema  Mental Status:  Patient is awake, alert, she is  dysarthric, but follows commands well. She is able to answer some questions, but typically insists on speaking which is very difficult to understand. She is able to name some objects.  Cranial Nerves:  II: Visual Fields are difficult to assess, but she does fixate and track, and does occasionally blink to threat from the left. Pupils are equal, round, and reactive to light. Discs are difficult to visualize.  III,IV, VI: EOMI with mildly disconjugate gaze at times , though then she is  able to conjugate. Post surgical R pupil V: Endorses sensation bilaterally  VII: Facial movement is notable for right droop  VIII: hearing is intact to voice  X: Does not comply  XI: Does not comply  XII: tongue is midline without atrophy or fasciculations.  Motor:  Tone is normal. Bulk is normal. 5/5 strength was present on the right side, on the left she does have some leg weakness 4/5, as well as mild arm weakness 4+/5.  Sensory:  Sensation is symmetric to light touch in the arms and legs.  Cerebellar:  She is grossly ataxic in the left arm and leg, she is mild difficulty with finger-nose-finger on the right  Gait:  Not assessed due to acute nature of evaluation and multiple medical monitors in ED setting  ASSESSMENT [name] is a 67 y.o. female with acute onset visual change and dysarthria and ataxia in the setting of multiple previous strokes. She is noncompliant with medications. MRI confirms L cerebellar and right posterior strokes consistent with embolic source of unknown source. She is s/p TPA  Arrival time 11/24/2012 at 17:21, given 1914. Multiple attenpts to reach family. Patient was on Plavix prior to admission, follow up CT/MR shows no hemorrhage; however, echo shows apical thrombus so IV heparin for coumadin bridge was ordered.   Apical thrombus  Hypertension  Dementia  Cachexia;  protein calorie malnutrition  COPD  HGB A1C  6.2  LDL 85, goal < 100, no statin indicated   UDS negative at this admission.  Hospital day # 3   TREATMENT/PLAN   Follow up Echo does show apical thrombus: start IV Heparin, coumadin load  Elevated Troponin cardiology following thinking this is due to Stroke   Risk factor modification.  Transfer out of unit  Carotid ultrasound pending.  Await therapy evaluations, suspect SNF.  Ambulate with assist.  Dr. Pearlean Brownie has discussed plan of care with family, patient and Dr. Sharyn Lull who has agreed to take over patient's care.   Gwendolyn Lima. Manson Passey, [facility], MBA, MHA [facility] Pager: [phone] 11/27/2012 7:45 AM  I have personally obtained a history, examined the patient, evaluated imaging results, and formulated the assessment and plan of care. I agree with the above. Delia Heady, MD

## 2012-11-28 DIAGNOSIS — I6340 Cerebral infarction due to embolism of unspecified cerebral artery: Secondary | ICD-10-CM

## 2012-11-28 LAB — CBC
Hemoglobin: 13.2 g/dL (ref 12.0–15.0)
MCH: 31.1 pg (ref 26.0–34.0)
MCHC: 34.7 g/dL (ref 30.0–36.0)
MCV: 89.4 fL (ref 78.0–100.0)
Platelets: 201 10*3/uL (ref 150–400)
RBC: 4.25 MIL/uL (ref 3.87–5.11)

## 2012-11-28 LAB — GLUCOSE, CAPILLARY
Glucose-Capillary: 153 mg/dL — ABNORMAL HIGH (ref 70–99)
Glucose-Capillary: 99 mg/dL (ref 70–99)
Glucose-Capillary: 98 mg/dL (ref 70–99)

## 2012-11-28 LAB — HEPARIN LEVEL (UNFRACTIONATED): Heparin Unfractionated: 0.25 IU/mL — ABNORMAL LOW (ref 0.30–0.70)

## 2012-11-28 MED ORDER — DIPHENHYDRAMINE HCL 25 MG PO CAPS
25.0000 mg | ORAL_CAPSULE | Freq: Four times a day (QID) | ORAL | Status: DC | PRN
  Administered 2012-11-28: 25 mg via ORAL
  Filled 2012-11-28: qty 1

## 2012-11-28 MED ORDER — LEVALBUTEROL HCL 0.63 MG/3ML IN NEBU
0.6300 mg | INHALATION_SOLUTION | RESPIRATORY_TRACT | Status: DC | PRN
  Administered 2012-11-28 – 2012-11-29 (×4): 0.63 mg via RESPIRATORY_TRACT
  Filled 2012-11-28 (×2): qty 3

## 2012-11-28 MED ORDER — WARFARIN SODIUM 4 MG PO TABS
4.0000 mg | ORAL_TABLET | Freq: Once | ORAL | Status: DC
  Administered 2012-11-28: 4 mg via ORAL
  Filled 2012-11-28: qty 1

## 2012-11-28 MED ORDER — ENSURE COMPLETE PO LIQD
237.0000 mL | Freq: Two times a day (BID) | ORAL | Status: DC
  Administered 2012-11-28 – 2012-11-29 (×3): 237 mL via ORAL

## 2012-11-28 MED ORDER — CARVEDILOL 3.125 MG PO TABS
3.1250 mg | ORAL_TABLET | Freq: Two times a day (BID) | ORAL | Status: DC
  Administered 2012-11-28 – 2012-11-29 (×2): 3.125 mg via ORAL
  Filled 2012-11-28 (×4): qty 1

## 2012-11-28 NOTE — Progress Notes (Signed)
While administering medication patient became choked on thickened water. Pt able to cough up medication. Breath sounds remain clear bilaterally, no cough present. Patient suctioned clear mucous out of mouth. Pt. Transferred to [facility] and started wheezing. Respiratory therapy called. O2 saturations 97% on RA, respirations 18. Will continue to monitor.  

## 2012-11-28 NOTE — Progress Notes (Signed)
Subjective:  Patient denies any chest pain or shortness of breath.  No further new neuro deficits.  INR still subtherapeutic on IV heparin and Coumadin.  Bradycardia resolved  Objective:  Vital Signs in the last 24 hours: Temp:  [97.4 F (36.3 C)-98.6 F (37 C)] 97.9 F (36.6 C) (10/08 1026) Pulse Rate:  [48-78] 48 (10/08 1026) Resp:  [18-20] 18 (10/08 1026) BP: (108-153)/(46-100) 129/92 mmHg (10/08 1026) SpO2:  [90 %-100 %] 94 % (10/08 1026)  Intake/Output from previous day: 10/07 0701 - 10/08 0700 In: 526.5 [P.O.:360; I.V.:166.5] Out: -  Intake/Output from this shift: Total I/O In: 240 [P.O.:240] Out: 100 [Urine:100]  Physical Exam: Neck: no adenopathy, no carotid bruit, no JVD and supple, symmetrical, trachea midline Lungs: decreased breath sounds at bases no wheezing Heart: regular rate and rhythm, S1, S2 normal and soft systolic murmur noted no S3 gallop Abdomen: soft, non-tender; bowel sounds normal; no masses,  no organomegaly Extremities: extremities normal, atraumatic, no cyanosis or edema  Lab Results:  Recent Labs  11/27/12 0905 11/28/12 0716  WBC 3.4* 3.4*  HGB 15.4* 13.2  PLT 192 201   No results found for this basename: NA, K, CL, CO2, GLUCOSE, BUN, CREATININE,  in the last 72 hours No results found for this basename: TROPONINI, CK, MB,  in the last 72 hours Hepatic Function Panel No results found for this basename: PROT, ALBUMIN, AST, ALT, ALKPHOS, BILITOT, BILIDIR, IBILI,  in the last 72 hours No results found for this basename: CHOL,  in the last 72 hours No results found for this basename: PROTIME,  in the last 72 hours  Imaging: Imaging results have been reviewed and No results found.  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 Dr. Algie Coffer Covering me until Friday morning  LOS: 4 days    Tkai Large N 11/28/2012, 12:21 PM

## 2012-11-28 NOTE — Progress Notes (Addendum)
I will contact family members to discuss pt's prior functional level and to assist with determining rehab venue options. [phone] I contacted pt's sister, Dolores Patty, and niece Astrid Drafts, by phone. They are in agreement to inpt rehab admission tomorrow and I will arrange.

## 2012-11-28 NOTE — Progress Notes (Signed)
Physical Therapy Treatment Patient Details Name: [name] MRN: [id number] DOB: 02/15/1946 Today's Date: 11/28/2012 Time: 7829-5621 PT Time Calculation (min): 26 min  PT Assessment / Plan / Recommendation  History of Present Illness 67 y.o. female who was apparently eating a english muffin earlier when she became suddenly dysarthric.She has had multiple strokes in the past. MRI confirms two recent infarcts. She does endorse vision changes, and says that they started at the same time as her other symptoms. She currently is very dysarthric, and very ataxic on the left.     PT Comments   Patient able to progress with ambulation this session due to +2 assist as one person is needed to control and place LLE with gait. Patient motivated and agreeable to work and is hopeful for continued rehab prior to DC home.   Follow Up Recommendations  CIR     Does the patient have the potential to tolerate intense rehabilitation     Barriers to Discharge        Equipment Recommendations  None recommended by PT    Recommendations for Other Services    Frequency Min 4X/week   Progress towards PT Goals Progress towards PT goals: Progressing toward goals  Plan Current plan remains appropriate    Precautions / Restrictions Precautions Precautions: Fall   Pertinent Vitals/Pain Denied pain    Mobility  Bed Mobility Supine to Sit: 5: Supervision Details for Bed Mobility Assistance: increased time, ataxic mvmts Transfers Sit to Stand: 4: Min assist;From bed Stand to Sit: 4: Min assist;To chair/3-in-1 Details for Transfer Assistance: Cues for safe hand placement and not to pull up from RW Ambulation/Gait Ambulation/Gait Assistance: 1: +2 Total assist Ambulation/Gait: Patient Percentage: 60% Ambulation Distance (Feet): 20 Feet Assistive device: Rolling walker Ambulation/Gait Assistance Details: +2 A as one patient control and postioning L LE with stance and advancement. Contniues to be ataxic  with LLE movement and postioning Gait Pattern: Step-to pattern;Decreased stance time - left;Decreased step length - left;Decreased hip/knee flexion - left;Decreased dorsiflexion - right;Ataxic;Narrow base of support;Lateral trunk lean to left Gait velocity: slow General Gait Details: pt at increased falls risk. pt fatigue quickly requiring to sit down    Exercises     PT Diagnosis:    PT Problem List:   PT Treatment Interventions:     PT Goals (current goals can now be found in the care plan section)    Visit Information  Last PT Received On: 11/28/12 Assistance Needed: +2 History of Present Illness: 67 y.o. female who was apparently eating a english muffin earlier when she became suddenly dysarthric.She has had multiple strokes in the past. MRI confirms two recent infarcts. She does endorse vision changes, and says that they started at the same time as her other symptoms. She currently is very dysarthric, and very ataxic on the left.      Subjective Data      Cognition  Cognition Arousal/Alertness: Awake/alert Behavior During Therapy: WFL for tasks assessed/performed Overall Cognitive Status: No family/caregiver present to determine baseline cognitive functioning Area of Impairment: Attention;Memory;Following commands;Safety/judgement;Awareness;Problem solving Safety/Judgement: Decreased awareness of safety;Decreased awareness of deficits Problem Solving: Slow processing;Requires verbal cues    Balance  Static Standing Balance Static Standing - Balance Support: Bilateral upper extremity supported Static Standing - Level of Assistance: 4: Min assist Static Standing - Comment/# of Minutes: weight shifting in standing to initiate placement of heel and weight through LLE  End of Session PT - End of Session Equipment Utilized During Treatment: Gait  belt Activity Tolerance: Patient limited by fatigue Patient left: in chair;with call bell/phone within reach Nurse Communication:  Mobility status   GP     Fredrich Birks 11/28/2012, 11:45 AM  11/28/2012 Fredrich Birks PTA [phone] pager [phone] office

## 2012-11-28 NOTE — Progress Notes (Signed)
ANTICOAGULATION CONSULT NOTE - Follow Up Consult  Pharmacy Consult for heparin Indication: cardioembolic CVA with apical thrombus  Allergies  Allergen Reactions  . Codeine Itching    All over the body  . Penicillins Hives and Other (See Comments)    "Whelps" per patient.  . Hydralazine     02/28/11 Family unsure if this is allergy for pt    Patient Measurements: Height: 5\' 1"  (154.9 cm) Weight: 88 lb 2.9 oz (40 kg) IBW/kg (Calculated) : 47.8 Heparin Dosing Weight: 40 kg  Vital Signs: Temp: 98.1 F (36.7 C) (10/08 1741) Temp src: Oral (10/08 1741) BP: 136/77 mmHg (10/08 1741) Pulse Rate: 59 (10/08 1741)  Labs:  Recent Labs  11/26/12 1025  11/27/12 0905 11/27/12 1223 11/27/12 2300 11/28/12 0716 11/28/12 2000  HGB  --   --  15.4*  --   --  13.2  --   HCT  --   --  46.0  --   --  38.0  --   PLT  --   --  192  --   --  201  --   LABPROT 15.7*  --   --  17.3*  --  20.4*  --   INR 1.28  --   --  1.45  --  1.80*  --   HEPARINUNFRC  --   < >  --  0.43 0.31 0.25* 0.51  < > = values in this interval not displayed.  Estimated Creatinine Clearance: 26.5 ml/min (by C-G formula based on Cr of 1.3).   Medications:  Scheduled:  . antiseptic oral rinse  15 mL Mouth Rinse q12n4p  . carvedilol  3.125 mg Oral BID WC  . chlorhexidine  15 mL Mouth Rinse BID  . feeding supplement (ENSURE COMPLETE)  237 mL Oral BID BM  . pantoprazole  40 mg Oral QHS  . ramipril  2.5 mg Oral Daily  . Warfarin - Pharmacist Dosing Inpatient   Does not apply q1800   Infusions:  . sodium chloride 50 mL/hr at 11/26/12 1610  . heparin 650 Units/hr (11/28/12 1854)    Assessment: 67 yo female with cardioembolic CVA with apical thrombus is currently on heparin.  Heparin level is at 0.51. Goal of Therapy:  Heparin level 0.3-0.5 units/ml Monitor platelets by anticoagulation protocol: Yes   Plan:  1) Continue heparin at 650 units/hr 2) Heparin level and CBC in am  Blanchard Willhite, [name] 11/28/2012,10:01  PM

## 2012-11-28 NOTE — Progress Notes (Signed)
Stroke Team Progress Note  HISTORY MATYLDA FEHRING is a 67 y.o. female who was apparently eating a english muffin earlier when she became suddenly dysarthric. It is unclear if she had any other changes earlier. She has had multiple strokes in the past and volunteers that she is not taking any medications.  Her history was very unclear initially and therefore she was taken to MRI which confirms two recent infarcts. She does endorse vision changes, and says that they started at the same time as her other symptoms. She currently is very dysarthric, and very ataxic on the left.  Initially, time of onset was very unclear and I was unable to get a hold of family. Finally after repeated attempts I was able to get a hold her niece that Saw her onset and was able to confirm time of onset TPA completed at 20:15  Pt is L hand dominant.    SUBJECTIVE  Patient lying in bed. No new neurological symptoms. INR is subtherapeutic. Still on heparin.    OBJECTIVE Most recent Vital Signs: Filed Vitals:   11/27/12 1950 11/27/12 2131 11/28/12 0200 11/28/12 0532  BP:  108/46 138/85 141/82  Pulse:  64 54 51  Temp:  97.4 F (36.3 C) 97.6 F (36.4 C) 98 F (36.7 C)  TempSrc:  Oral Oral Oral  Resp:  20 20 20   Height:      Weight:      SpO2: 100% 98% 96% 96%   CBG (last 3)   Recent Labs  11/27/12 1611 11/27/12 2118 11/28/12 0628  GLUCAP 96 81 153*    IV Fluid Intake:   . sodium chloride 50 mL/hr at 11/26/12 1610  . heparin 550 Units/hr (11/26/12 2337)    MEDICATIONS  . antiseptic oral rinse  15 mL Mouth Rinse q12n4p  . chlorhexidine  15 mL Mouth Rinse BID  . levalbuterol  0.63 mg Nebulization TID  . pantoprazole  40 mg Oral QHS  . ramipril  2.5 mg Oral Daily  . Warfarin - Pharmacist Dosing Inpatient   Does not apply q1800   PRN:  acetaminophen, acetaminophen, RESOURCE THICKENUP CLEAR  Diet:  Dysphagia  Activity:  Ambulate with assistance DVT Prophylaxis:  IV heparin  CLINICALLY  SIGNIFICANT STUDIES Basic Metabolic Panel:   Recent Labs Lab 11/24/12 1729 11/24/12 1737  NA 144 145  K 3.4* 3.4*  CL 103 107  CO2 25  --   GLUCOSE 145* 149*  BUN 20 20  CREATININE 1.17* 1.30*  CALCIUM 9.8  --    Liver Function Tests:   Recent Labs Lab 11/24/12 1729  AST 34  ALT 8  ALKPHOS 93  BILITOT 0.6  PROT 8.1  ALBUMIN 3.9   CBC:   Recent Labs Lab 11/24/12 1729 11/24/12 1737 11/27/12 0905  WBC 4.7  --  3.4*  NEUTROABS 1.6*  --   --   HGB 15.1* 16.7* 15.4*  HCT 45.2 49.0* 46.0  MCV 91.5  --  90.7  PLT 233  --  192   Coagulation:   Recent Labs Lab 11/24/12 1729 11/26/12 1025 11/27/12 1223  LABPROT 13.2 15.7* 17.3*  INR 1.02 1.28 1.45   Cardiac Enzymes:   Recent Labs Lab 11/24/12 1729 11/24/12 1853  TROPONINI 2.88* 2.53*   Urinalysis:   Recent Labs Lab 11/24/12 1950  COLORURINE YELLOW  LABSPEC 1.020  PHURINE 6.0  GLUCOSEU NEGATIVE  HGBUR NEGATIVE  BILIRUBINUR NEGATIVE  KETONESUR 15*  PROTEINUR 30*  UROBILINOGEN 1.0  NITRITE NEGATIVE  LEUKOCYTESUR  SMALL*   Drugs of Abuse     Component Value Date/Time   LABOPIA NONE DETECTED 11/24/2012 1950   LABOPIA NEGATIVE 04/01/2009 1007   COCAINSCRNUR NONE DETECTED 11/24/2012 1950   COCAINSCRNUR  Value: POSITIVE (NOTE) Result repeated and verified. Sent for confirmatory testing* 04/01/2009 1007   LABBENZ NONE DETECTED 11/24/2012 1950   LABBENZ NEGATIVE 04/01/2009 1007   AMPHETMU NONE DETECTED 11/24/2012 1950   AMPHETMU NEGATIVE 04/01/2009 1007   THCU NONE DETECTED 11/24/2012 1950   LABBARB NONE DETECTED 11/24/2012 1950     Lipid Panel    Component Value Date/Time   CHOL 175 11/25/2012 0401   TRIG 59 11/25/2012 0401   HDL 78 11/25/2012 0401   CHOLHDL 2.2 11/25/2012 0401   VLDL 12 11/25/2012 0401   LDLCALC 85 11/25/2012 0401   HgbA1C  Lab Results  Component Value Date   HGBA1C 6.2* 11/25/2012    Urine Drug Screen:    Alcohol Level:   Recent Labs Lab 11/24/12 1729  ETH <11    Ct Head Wo  Contrast 11/25/2012  : Acute left cerebellar infarct with some progression from the prior exam. . The hypodensity measures approximately 2.8 x 1.8 cm in greatest transverse and AP views dimensions respectively. This was not well seen on the recent CT examination but was identified on the previous MRI.  No acute hemorrhage is seen.  11/24/2012  : 1. No evidence of acute cortical infarction.  2. No evidence of intracranial hemorrhage.  3. Multiple remote cortical hemispheric infarctions are again demonstrated.  4.  Atrophy and chronic microvascular disease again demonstrated.   Mr Brain Wo Contrast 11/24/2012 Acute infarction affects the left cerebellum and right posterior temporal regions.   Dg Chest Port 1 View 11/24/2012   : No active disease.      Mr Maxine Glenn Head/brain Wo Cm 11/25/2012   No proximal ICA, MCA, basilar, or vertebral flow reducing lesion. Mild intracranial atherosclerotic change of the distal MCA vessels may have slightly progressed from 2013. 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       CT of the brain  No evidence of acute cortical infarction.  MRI of the brain  Acute infarction affects the left cerebellum and right posterior  temporal regions.   2D Echocardiogram  06/25/2012 Left ventricle: The cavity size was moderately dilated. The estimated ejection fraction was 10%. Diffuse hypokinesis. There was spontaneous echo contrast, indicative of stasis.- Mitral valve: Mild regurgitation.- Atrial septum: No defect or patent foramen ovale was  Identified 11/25/2012 Left ventricle: The cavity size was moderately dilated.The estimated ejection fraction was 15%, in the range of 10% to 15%. Wall motion was normal; there were no regional wall motion abnormalities. There was a thrombus. There was a small, apical thrombus. Atrial septum: No defect or patent foramen ovale was identified.  Carotid Doppler   Bilateral: 1-39% ICA stenosis. Vertebral artery flow is antegrade.   EKG  normal EKG, normal  sinus rhythm, unchanged from previous tracings.   Therapy Recommendations SNF (has also failed swallow)  Physical Exam   General: in bed, appears nauseous.  CV: RRR  Abd: Nontender nondistended  Respiratory: Clear to auscultation  Extremities: No edema  Mental Status:  Patient is awake, alert, she is  dysarthric, but follows commands well. She is able to answer some questions, but typically insists on speaking which is very difficult to understand. She is able to name some objects.  Cranial Nerves:  II: Visual Fields are difficult to assess, but she does  fixate and track, and does occasionally blink to threat from the left. Pupils are equal, round, and reactive to light. Discs are difficult to visualize.  III,IV, VI: EOMI with mildly disconjugate gaze at times , though then she is able to conjugate. Post surgical R pupil V: Endorses sensation bilaterally  VII: Facial movement is notable for right droop  VIII: hearing is intact to voice  X: Does not comply  XI: Does not comply  XII: tongue is midline without atrophy or fasciculations.  Motor:  Tone is normal. Bulk is normal. 5/5 strength was present on the right side, on the left she does have some leg weakness 4/5, as well as mild arm weakness 4+/5.  Sensory:  Sensation is symmetric to light touch in the arms and legs.  Cerebellar:  She is grossly ataxic in the left arm and leg, she is mild difficulty with finger-nose-finger on the right  Gait:  Not assessed due to acute nature of evaluation and multiple medical monitors in ED setting  ASSESSMENT Ms. KAWEHI HOSTETTER is a 67 y.o. female with acute onset visual change and dysarthria and ataxia in the setting of multiple previous strokes. She is noncompliant with medications. MRI confirms L cerebellar and right posterior strokes consistent with embolic source of unknown source. She is s/p TPA  Arrival time 11/24/2012 at 17:21, given 1914. Multiple attenpts to reach family. Patient was on  Plavix prior to admission, follow up CT/MR shows no hemorrhage; however, echo shows apical thrombus so IV heparin for coumadin bridge was ordered.   Apical thrombus  Hypertension  Dementia  Cachexia; protein calorie malnutrition  COPD  HGB A1C  6.2  LDL 85, goal < 100, no statin indicated   UDS negative at this admission.  Hospital day # 4   TREATMENT/PLAN   Follow up Echo does show apical thrombus: start IV Heparin, coumadin load, INR 1.45  Elevated Troponin cardiology following thinking this is due to Stroke   Risk factor modification.  CIR recommended  Dr. Pearlean Brownie has discussed plan of care with family, patient and Dr. Sharyn Lull who has agreed to take over patient's care.   Have patient follow up with Dr. Pearlean Brownie in 2 months.  Gwendolyn Lima. Manson Passey, [facility], MBA, MHA [facility] Pager: [phone] 11/28/2012 7:48 AM  I have personally obtained a history, examined the patient, evaluated imaging results, and formulated the assessment and plan of care. I agree with the above. Delia Heady, MD

## 2012-11-28 NOTE — Progress Notes (Signed)
ANTICOAGULATION CONSULT NOTE - Follow Up Consult  Pharmacy Consult for Heparin and warfarin Indication: cardioembolic CVA with apical thrombus  Allergies  Allergen Reactions  . Codeine Itching    All over the body  . Penicillins Hives and Other (See Comments)    "Whelps" per patient.  . Hydralazine     02/28/11 Family unsure if this is allergy for pt    Patient Measurements: Height: 5\' 1"  (154.9 cm) Weight: 88 lb 2.9 oz (40 kg) IBW/kg (Calculated) : 47.8 Heparin dosing weight: 40kg  Vital Signs: Temp: 97.9 F (36.6 C) (10/08 1026) Temp src: Oral (10/08 1026) BP: 129/92 mmHg (10/08 1026) Pulse Rate: 48 (10/08 1026)  Labs:  Recent Labs  11/26/12 1025  11/27/12 0905 11/27/12 1223 11/27/12 2300 11/28/12 0716  HGB  --   --  15.4*  --   --  13.2  HCT  --   --  46.0  --   --  38.0  PLT  --   --  192  --   --  201  LABPROT 15.7*  --   --  17.3*  --  20.4*  INR 1.28  --   --  1.45  --  1.80*  HEPARINUNFRC  --   < >  --  0.43 0.31 0.25*  < > = values in this interval not displayed.  Estimated Creatinine Clearance: 26.5 ml/min (by C-G formula based on Cr of 1.3).  Medications:  Prescriptions prior to admission  Medication Sig Dispense Refill  . albuterol (PROVENTIL) 90 MCG/ACT inhaler Inhale 2 puffs into the lungs every 6 (six) hours as needed. For wheezing       . budesonide-formoterol (SYMBICORT) 80-4.5 MCG/ACT inhaler Inhale 2 puffs into the lungs 2 (two) times daily.      . clopidogrel (PLAVIX) 75 MG tablet Take 75 mg by mouth daily.        . furosemide (LASIX) 40 MG tablet Take 40 mg by mouth daily.      . nitroGLYCERIN (NITROSTAT) 0.4 MG SL tablet Place 0.4 mg under the tongue every 5 (five) minutes as needed. For chest pain      . OLANZapine (ZYPREXA) 2.5 MG tablet Take 1 tablet (2.5 mg total) by mouth at bedtime.      . potassium chloride (K-DUR) 10 MEQ tablet Take 10 mEq by mouth daily.      . QUEtiapine (SEROQUEL) 50 MG tablet Take 50 mg by mouth daily.         Assessment: 67 yo female who presented to the ED on 10/4 with dysarthria and slurred speech and was given tPA at for confirmed infarcts on MRI. 2D echo also reports a small apical thrombus. Pharmacy consulted to begin a heparin drip as a bridge to warfarin. INR increased to 1.8 today. Heparin level was therapeutic x2, but this morning decreased to below goal at 0.25units/mL. Spoke with RN and no issues with line and gtt has not been held. No bleeding noted, CBC stable and WNL.  Goal of Therapy:  INR 2-3 Heparin level 0.3-0.5 units/ml Monitor platelets by anticoagulation protocol: Yes   Plan:  1. Increase Heparin to 650 units/hr IV  2. Warfarin 4mg  PO tonight 3. Heparin level in 8 hours 4. Daily INR, CBC, and heparin level 5. Monitor for s/sx of bleeding  Jaid Quirion D. Aloria Looper, PharmD Clinical Pharmacist Pager: [phone] 11/28/2012 11:30 AM

## 2012-11-29 ENCOUNTER — Inpatient Hospital Stay (HOSPITAL_COMMUNITY)
Admission: RE | Admit: 2012-11-29 | Discharge: 2012-12-12 | DRG: 945 | Disposition: A | Discharge status: Home Health | Payer: Medicare Other | Source: Intra-hospital | Attending: Physical Medicine & Rehabilitation | Admitting: Physical Medicine & Rehabilitation

## 2012-11-29 DIAGNOSIS — I2510 Atherosclerotic heart disease of native coronary artery without angina pectoris: Secondary | ICD-10-CM | POA: Diagnosis present

## 2012-11-29 DIAGNOSIS — I428 Other cardiomyopathies: Secondary | ICD-10-CM | POA: Diagnosis present

## 2012-11-29 DIAGNOSIS — Z7901 Long term (current) use of anticoagulants: Secondary | ICD-10-CM

## 2012-11-29 DIAGNOSIS — R1310 Dysphagia, unspecified: Secondary | ICD-10-CM | POA: Diagnosis present

## 2012-11-29 DIAGNOSIS — I639 Cerebral infarction, unspecified: Secondary | ICD-10-CM

## 2012-11-29 DIAGNOSIS — F209 Schizophrenia, unspecified: Secondary | ICD-10-CM | POA: Diagnosis present

## 2012-11-29 DIAGNOSIS — Z5189 Encounter for other specified aftercare: Principal | ICD-10-CM

## 2012-11-29 DIAGNOSIS — R471 Dysarthria and anarthria: Secondary | ICD-10-CM | POA: Diagnosis present

## 2012-11-29 DIAGNOSIS — Z87891 Personal history of nicotine dependence: Secondary | ICD-10-CM

## 2012-11-29 DIAGNOSIS — Z853 Personal history of malignant neoplasm of breast: Secondary | ICD-10-CM

## 2012-11-29 DIAGNOSIS — J449 Chronic obstructive pulmonary disease, unspecified: Secondary | ICD-10-CM | POA: Diagnosis present

## 2012-11-29 DIAGNOSIS — I509 Heart failure, unspecified: Secondary | ICD-10-CM

## 2012-11-29 DIAGNOSIS — Z8673 Personal history of transient ischemic attack (TIA), and cerebral infarction without residual deficits: Secondary | ICD-10-CM

## 2012-11-29 DIAGNOSIS — I6340 Cerebral infarction due to embolism of unspecified cerebral artery: Secondary | ICD-10-CM

## 2012-11-29 DIAGNOSIS — F0390 Unspecified dementia without behavioral disturbance: Secondary | ICD-10-CM | POA: Diagnosis present

## 2012-11-29 DIAGNOSIS — J4489 Other specified chronic obstructive pulmonary disease: Secondary | ICD-10-CM | POA: Diagnosis present

## 2012-11-29 DIAGNOSIS — I214 Non-ST elevation (NSTEMI) myocardial infarction: Secondary | ICD-10-CM | POA: Diagnosis present

## 2012-11-29 DIAGNOSIS — I219 Acute myocardial infarction, unspecified: Secondary | ICD-10-CM

## 2012-11-29 DIAGNOSIS — G8110 Spastic hemiplegia affecting unspecified side: Secondary | ICD-10-CM

## 2012-11-29 DIAGNOSIS — F1910 Other psychoactive substance abuse, uncomplicated: Secondary | ICD-10-CM | POA: Diagnosis present

## 2012-11-29 DIAGNOSIS — I749 Embolism and thrombosis of unspecified artery: Secondary | ICD-10-CM | POA: Diagnosis present

## 2012-11-29 DIAGNOSIS — G4733 Obstructive sleep apnea (adult) (pediatric): Secondary | ICD-10-CM | POA: Diagnosis present

## 2012-11-29 DIAGNOSIS — F411 Generalized anxiety disorder: Secondary | ICD-10-CM | POA: Diagnosis present

## 2012-11-29 DIAGNOSIS — I69921 Dysphasia following unspecified cerebrovascular disease: Secondary | ICD-10-CM

## 2012-11-29 DIAGNOSIS — I10 Essential (primary) hypertension: Secondary | ICD-10-CM

## 2012-11-29 LAB — PROTIME-INR: INR: 1.86 — ABNORMAL HIGH (ref 0.00–1.49)

## 2012-11-29 LAB — GLUCOSE, CAPILLARY
Glucose-Capillary: 60 mg/dL — ABNORMAL LOW (ref 70–99)
Glucose-Capillary: 82 mg/dL (ref 70–99)
Glucose-Capillary: 154 mg/dL — ABNORMAL HIGH (ref 70–99)

## 2012-11-29 LAB — CBC
HCT: 45.5 % (ref 36.0–46.0)
Hemoglobin: 15.7 g/dL — ABNORMAL HIGH (ref 12.0–15.0)
MCH: 31.1 pg (ref 26.0–34.0)
MCHC: 34.5 g/dL (ref 30.0–36.0)
RBC: 5.05 MIL/uL (ref 3.87–5.11)

## 2012-11-29 MED ORDER — WARFARIN SODIUM 4 MG PO TABS
4.0000 mg | ORAL_TABLET | Freq: Once | ORAL | Status: AC
  Administered 2012-11-29: 4 mg via ORAL
  Filled 2012-11-29: qty 1

## 2012-11-29 MED ORDER — ONDANSETRON HCL 4 MG/2ML IJ SOLN: 4.0000 mg | Freq: Four times a day (QID) | INTRAMUSCULAR | Status: DC | PRN

## 2012-11-29 MED ORDER — HEPARIN (PORCINE) IN NACL 100-0.45 UNIT/ML-% IJ SOLN
600.0000 [IU]/h | INTRAMUSCULAR | Status: DC
  Administered 2012-11-29: 550 [IU]/h via INTRAVENOUS
  Administered 2012-11-30: 600 [IU]/h via INTRAVENOUS
  Filled 2012-11-29: qty 250

## 2012-11-29 MED ORDER — CARVEDILOL 3.125 MG PO TABS
3.1250 mg | ORAL_TABLET | Freq: Two times a day (BID) | ORAL | Status: DC
  Administered 2012-11-29 – 2012-12-12 (×24): 3.125 mg via ORAL
  Filled 2012-11-29 (×29): qty 1

## 2012-11-29 MED ORDER — RESOURCE THICKENUP CLEAR PO POWD
ORAL | Status: DC | PRN
Start: 2012-11-29 — End: 2012-12-12
  Filled 2012-11-29: qty 125

## 2012-11-29 MED ORDER — ALPRAZOLAM 0.25 MG PO TABS
0.1250 mg | ORAL_TABLET | Freq: Four times a day (QID) | ORAL | Status: DC | PRN
  Administered 2012-11-29 – 2012-11-30 (×3): 0.125 mg via ORAL
  Filled 2012-11-29 (×3): qty 1

## 2012-11-29 MED ORDER — SORBITOL 70 % SOLN
30.0000 mL | Freq: Every day | Status: DC | PRN
  Administered 2012-12-03: 30 mL via ORAL
  Filled 2012-11-29: qty 30

## 2012-11-29 MED ORDER — BIOTENE DRY MOUTH MT LIQD
15.0000 mL | Freq: Two times a day (BID) | OROMUCOSAL | Status: DC
  Administered 2012-11-30 – 2012-12-11 (×17): 15 mL via OROMUCOSAL

## 2012-11-29 MED ORDER — WARFARIN SODIUM 4 MG PO TABS
4.0000 mg | ORAL_TABLET | Freq: Once | ORAL | Status: DC
  Filled 2012-11-29: qty 1

## 2012-11-29 MED ORDER — ONDANSETRON HCL 4 MG PO TABS
4.0000 mg | ORAL_TABLET | Freq: Four times a day (QID) | ORAL | Status: DC | PRN
  Administered 2012-11-30: 4 mg via ORAL
  Filled 2012-11-29: qty 1

## 2012-11-29 MED ORDER — HEPARIN (PORCINE) IN NACL 100-0.45 UNIT/ML-% IJ SOLN
550.0000 [IU]/h | INTRAMUSCULAR | Status: DC
  Administered 2012-11-29: 550 [IU]/h via INTRAVENOUS
  Filled 2012-11-29: qty 250

## 2012-11-29 MED ORDER — INFLUENZA VAC SPLIT QUAD 0.5 ML IM SUSP
0.5000 mL | INTRAMUSCULAR | Status: AC
  Administered 2012-11-30: 0.5 mL via INTRAMUSCULAR
  Filled 2012-11-29: qty 0.5

## 2012-11-29 MED ORDER — RAMIPRIL 2.5 MG PO CAPS
2.5000 mg | ORAL_CAPSULE | Freq: Every day | ORAL | Status: DC
  Administered 2012-11-30 – 2012-12-12 (×13): 2.5 mg via ORAL
  Filled 2012-11-29 (×14): qty 1

## 2012-11-29 MED ORDER — LEVALBUTEROL HCL 0.63 MG/3ML IN NEBU
0.6300 mg | INHALATION_SOLUTION | RESPIRATORY_TRACT | Status: DC | PRN
  Administered 2012-11-30 – 2012-12-01 (×2): 0.63 mg via RESPIRATORY_TRACT
  Filled 2012-11-29 (×3): qty 3

## 2012-11-29 MED ORDER — ENSURE COMPLETE PO LIQD
237.0000 mL | Freq: Two times a day (BID) | ORAL | Status: DC
  Administered 2012-12-01 – 2012-12-03 (×4): 237 mL via ORAL

## 2012-11-29 MED ORDER — LEVALBUTEROL HCL 0.63 MG/3ML IN NEBU
0.6300 mg | INHALATION_SOLUTION | Freq: Three times a day (TID) | RESPIRATORY_TRACT | Status: DC
  Filled 2012-11-29 (×2): qty 3

## 2012-11-29 MED ORDER — WARFARIN - PHARMACIST DOSING INPATIENT
Freq: Every day | Status: DC
  Administered 2012-12-07: 17:00:00

## 2012-11-29 MED ORDER — ACETAMINOPHEN 325 MG PO TABS
325.0000 mg | ORAL_TABLET | ORAL | Status: DC | PRN
  Administered 2012-11-30 – 2012-12-11 (×9): 650 mg via ORAL
  Filled 2012-11-29 (×9): qty 2

## 2012-11-29 MED ORDER — PANTOPRAZOLE SODIUM 40 MG PO TBEC
40.0000 mg | DELAYED_RELEASE_TABLET | Freq: Every day | ORAL | Status: DC
  Administered 2012-11-29 – 2012-12-11 (×13): 40 mg via ORAL
  Filled 2012-11-29 (×13): qty 1

## 2012-11-29 NOTE — Progress Notes (Signed)
Patients blood glucose 60. Thickened orange juice given to patient. Will recheck sugar. No s/s of hypoglycemia noted. Will continue to monitor.

## 2012-11-29 NOTE — Progress Notes (Signed)
Pt had an anxiety episode after dinner 1830 with O2 levels 70's. Harvel Ricks Notified with episode.  New order for 2L O2 [place] to stay above 90%O2, xanax 0.125 PRN, and SR x4 because pt unable to understand use of call bell and bed alarm will not set b/c of her weight. Pt had dose of xanax and [place] inplace with O2 levels remaining low 90s. Pt in bed with call bell in reach and SRx4. Continue to monitor pt.

## 2012-11-29 NOTE — Progress Notes (Signed)
ANTICOAGULATION CONSULT NOTE - Follow Up Consult  Pharmacy Consult for Heparin  Indication: cardioembolic CVA with apical thrombus   Patient Measurements: Height: 5\' 1"  (154.9 cm) Weight: 80 lb (36.288 kg) IBW/kg (Calculated) : 47.8 Heparin dosing weight: 40kg  Vital Signs: Temp: 97.4 F (36.3 C) (10/09 1626) Temp src: Oral (10/09 1626) BP: 136/92 mmHg (10/09 1807) Pulse Rate: 67 (10/09 1807)  Labs:  Recent Labs  11/27/12 0905 11/27/12 1223  11/28/12 0716 11/28/12 2000 11/29/12 0740  HGB 15.4*  --   --  13.2  --  15.7*  HCT 46.0  --   --  38.0  --  45.5  PLT 192  --   --  201  --  216  LABPROT  --  17.3*  --  20.4*  --  20.9*  INR  --  1.45  --  1.80*  --  1.86*  HEPARINUNFRC  --  0.43  < > 0.25* 0.51 0.74*  < > = values in this interval not displayed.  Estimated Creatinine Clearance: 24.1 ml/min (by C-G formula based on Cr of 1.3).   Assessment: Lost IV site ~17:00 today; RN reported IV site dangling from finger in this 67 yo female on IV heparin infusion for CVA/small apical thrombus. Heparin drip as a bridge to warfarin. INR increased to 1.86 today. Heparin level was  0.74 this AM on 650 units/hr. Heparin was reduced to 550 units/hr with plan to check heparin level at 18:00.  Heparin level canceled as heparin infusion was stopped due to loss of IV site.  RN now reports IV access obtained and IV heparin drip restarted at approximately 20:55.  Difficult IV stick.  Will recheck heparin level with her 5am labs (~ 8hr heparin level).   Goal of Therapy:  INR 2-3 Heparin level 0.3-0.5 units/ml Monitor platelets by anticoagulation protocol: Yes   Plan:  1. Heparin drip restarted at rate of 550 units/hr  2. Check 8hour heparin level at 05:00 with AM labs, daily INR, CBC. 3. Monitor for s/sx of bleeding.  Noah Delaine, RPh Clinical Pharmacist Pager: [phone] 11/29/2012 9:00 PM

## 2012-11-29 NOTE — H&P (Signed)
Physical Medicine and Rehabilitation Admission H&P      Chief Complaint   Patient presents with   .  Code Stroke    : HPI: [name] is a 67 y.o. 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 Admitted 11/24/2012 with slurred speech. MRI of the brain showed acute infarcts affecting the left cerebellum and right posterior temporal regions. Also noted extensive encephalomalacia related to remote bihemispheric infarction. 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 Echocardiogram with ejection fraction of 15% no wall motion abnormalities and small apical thrombus. Carotid Dopplers with no ICA stenosis. Neurology services consulted. Patient did receive TPA. Findings of elevated troponin/small non-Q-wave myocardial infarction and followup cardiology services. No significant changes in EKG. Patient placed on Coumadin therapy for cardioembolic CVA with apical thrombus. Conservative medical management per cardiology services. Admission urine drug screen was negative. Patient currently maintained on a dysphagia 1 nectar thick liquid diet. Physical therapy evaluation completed 11/27/2012 with recommendations for physical medicine rehabilitation consult to consider inpatient rehabilitation services. Patient was felt to be acute candidate for inpatient rehabilitation services was admitted for comprehensive rehabilitation program  Patient is severely dysarthric. Alert and interactive   ROS Review of Systems   Respiratory: Positive for shortness of breath.   Cardiovascular: Positive for leg swelling.   Musculoskeletal: Positive for myalgias.   Neurological: Positive for seizures and weakness.   Psychiatric/Behavioral:  Positive for memory loss.   Schizophrenia   All other systems reviewed and are negative    Past Medical History   Diagnosis  Date   .  Weight loss, unintentional     .  Trouble swallowing     .  Change in voice     .  Substance abuse     .  Dementia     .  Asthma     .  Shortness of breath     .  Seizures         "    It has been along time "   .  CHF (congestive heart failure)     .  Headache(784.0)     .  Hypertension     .  Arthralgia     .  Tremors of nervous system     .  Anxiety     .  Rib fractures         hx of May 2012   .  Heart attack  05/21/10   .  Schizophrenia     .  Coronary artery disease     .  Stroke  07/11/10       hx of R CVA    .  COPD (chronic obstructive pulmonary disease)     .  Obstructive sleep apnea     .  Breast cancer  01/28/11       L breast, inv ductal/in situ, ER/PR -, Her2 -    Past Surgical History   Procedure  Laterality  Date   .  Total abdominal hysterectomy    age 67    Family History   Problem  Relation  Age of Onset   .  Breast cancer  Mother     .  Birth defects  Mother  breast   .  Cancer  Mother         breast   .  Heart disease  Father         heart attack   .  Heart attack  Father     .  Heart attack  Brother     .  Cancer  Brother         throat, lung   .  Cancer  Paternal Aunt     .  Cancer  Maternal Grandmother         breast     Social History: reports that she quit smoking about 4 years ago. Her smoking use included Cigarettes. She has a 50 pack-year smoking history. She has never used smokeless tobacco. She reports that she drinks alcohol. She reports that she uses illicit drugs. Allergies:   Allergies   Allergen  Reactions   .  Codeine  Itching       All over the body   .  Penicillins  Hives and Other (See Comments)       "Whelps" per patient.   .  Hydralazine         02/28/11 Family unsure if this is allergy for pt    Medications Prior to Admission   Medication  Sig  Dispense  Refill   .   albuterol (PROVENTIL) 90 MCG/ACT inhaler  Inhale 2 puffs into the lungs every 6 (six) hours as needed. For wheezing          .  budesonide-formoterol (SYMBICORT) 80-4.5 MCG/ACT inhaler  Inhale 2 puffs into the lungs 2 (two) times daily.         .  clopidogrel (PLAVIX) 75 MG tablet  Take 75 mg by mouth daily.           .  furosemide (LASIX) 40 MG tablet  Take 40 mg by mouth daily.         .  nitroGLYCERIN (NITROSTAT) 0.4 MG SL tablet  Place 0.4 mg under the tongue every 5 (five) minutes as needed. For chest pain         .  OLANZapine (ZYPREXA) 2.5 MG tablet  Take 1 tablet (2.5 mg total) by mouth at bedtime.         .  potassium chloride (K-DUR) 10 MEQ tablet  Take 10 mEq by mouth daily.         .  QUEtiapine (SEROQUEL) 50 MG tablet  Take 50 mg by mouth daily.            Home: Home Living Family/patient expects to be discharged to:: Private residence Living Arrangements: Other relatives Available Help at Discharge: Family;Available 24 hours/day Type of Home: Apartment Home Access: Stairs to enter Entrance Stairs-Number of Steps: 1 Home Layout: One level Home Equipment: Bedside commode;Walker - 2 wheels;Cane - single point;Shower seat  Lives With: Family    Functional History: Prior Function Vocation: Retired Comments: walks inside with RW independently; outside with supervision   Functional Status:   Mobility: Bed Mobility Bed Mobility: Supine to Sit;Sit to Supine Supine to Sit: 5: Supervision Sit to Supine: 5: Supervision Transfers Transfers: Sit to Stand;Stand to Sit Sit to Stand: 4: Min assist Stand to Sit: 4: Min assist Ambulation/Gait Ambulation/Gait Assistance: 3: Mod assist Ambulation Distance (Feet): 3 Feet Assistive device: Rolling walker Ambulation/Gait Assistance Details: pt ataxic with alternative amb pattern. pt easily fatigued. Pt with noted L LE tone Gait Pattern: Step-to pattern;Decreased stance time - left;Decreased  step length - left;Decreased hip/knee  flexion - left;Decreased dorsiflexion - right;Ataxic;Narrow base of support;Lateral trunk lean to left Gait velocity: slow General Gait Details: pt at increased falls risk. pt fatigue quickly requiring to sit down Stairs: No   ADL: ADL Grooming: Minimal assistance Where Assessed - Grooming: Unsupported sitting Upper Body Bathing: Minimal assistance Where Assessed - Upper Body Bathing: Unsupported sitting Lower Body Bathing: Moderate assistance Where Assessed - Lower Body Bathing: Supported sit to stand Upper Body Dressing: Minimal assistance Where Assessed - Upper Body Dressing: Unsupported sitting Lower Body Dressing: Moderate assistance Where Assessed - Lower Body Dressing: Supported sit to Pharmacist, hospital: Moderate assistance Toilet Transfer Method: Stand pivot Toilet Transfer Equipment: Other (comment) (simulated) Equipment Used: Gait belt;Rolling walker Transfers/Ambulation Related to ADLs: min A sit - stand. MOd A for beginning mobility ADL Comments: demonstrates a funcitonal decline   Cognition: Cognition Overall Cognitive Status: No family/caregiver present to determine baseline cognitive functioning Arousal/Alertness: Awake/alert Orientation Level: Oriented to person;Disoriented to time;Disoriented to situation;Disoriented to place Attention: Focused;Sustained Focused Attention: Impaired Focused Attention Impairment: Verbal basic;Functional basic Sustained Attention: Impaired Sustained Attention Impairment: Verbal basic;Functional basic Memory: Impaired Memory Impairment: Decreased recall of new information Awareness: Impaired Awareness Impairment: Intellectual impairment;Emergent impairment;Anticipatory impairment Problem Solving: Impaired Problem Solving Impairment: Verbal basic;Functional basic Executive Function: Reasoning;Sequencing;Self Monitoring Reasoning: Impaired Reasoning Impairment: Verbal basic;Functional basic Sequencing: Impaired Sequencing  Impairment: Verbal basic;Functional basic Self Monitoring: Impaired Self Monitoring Impairment: Verbal basic;Functional basic Cognition Arousal/Alertness: Awake/alert Behavior During Therapy: Restless Overall Cognitive Status: No family/caregiver present to determine baseline cognitive functioning Area of Impairment: Attention;Memory;Following commands;Safety/judgement;Awareness;Problem solving Current Attention Level: Sustained Memory: Decreased short-term memory Following Commands: Follows one step commands consistently Safety/Judgement: Decreased awareness of safety;Decreased awareness of deficits Awareness: Emergent Problem Solving: Slow processing;Requires verbal cues   Physical Exam: Blood pressure 141/82, pulse 51, temperature 98 F (36.7 C), temperature source Oral, resp. rate 20, height 5\' 1"  (1.549 m), weight 40 kg (88 lb 2.9 oz), SpO2 96.00%. Physical Exam Constitutional:  Frail elderly female appearing older than stated age.   HENT:   Head: Normocephalic.   Eyes: EOM are normal.   Neck: Normal range of motion. Neck supple. No thyromegaly present.   Pulmonary/Chest:  Decreased breath sounds clear to auscultation   Abdominal: Soft. Bowel sounds are normal. She exhibits no distension.  Neurological: She is alert.  Patient with significant dysarthria . Left upper limb ataxia. Strength bilateral UE is grossly 4/5. Bilateral LE is 3-/5 HF and KE , ankle dorsiflexion and plantarflexion. Heel cord tight. She did follow simple commands. Patient oriented x3 to basic questions. Left facial weakness.  Skin: Skin is warm and dry.  Psychiatric: She has a normal mood and affect.  A little impulsive, basic insight and emergent awareness     Results for orders placed during the hospital encounter of 11/24/12 (from the past 48 hour(s))   PROTIME-INR     Status: Abnormal     Collection Time      11/26/12 10:25 AM       Result  Value  Range     Prothrombin Time  15.7 (*)  11.6 - 15.2  seconds     INR  1.28   0.00 - 1.49   GLUCOSE, CAPILLARY     Status: None     Collection Time      11/26/12 12:11 PM       Result  Value  Range     Glucose-Capillary  84   70 - 99  mg/dL   GLUCOSE, CAPILLARY     Status: None     Collection Time      11/26/12  7:24 PM       Result  Value  Range     Glucose-Capillary  84   70 - 99 mg/dL   HEPARIN LEVEL (UNFRACTIONATED)     Status: Abnormal     Collection Time      11/26/12 10:25 PM       Result  Value  Range     Heparin Unfractionated  0.26 (*)  0.30 - 0.70 IU/mL     Comment:                IF HEPARIN RESULTS ARE BELOW        EXPECTED VALUES, AND PATIENT        DOSAGE HAS BEEN CONFIRMED,        SUGGEST FOLLOW UP TESTING        OF ANTITHROMBIN III LEVELS.   GLUCOSE, CAPILLARY     Status: Abnormal     Collection Time      11/26/12 10:51 PM       Result  Value  Range     Glucose-Capillary  118 (*)  70 - 99 mg/dL   GLUCOSE, CAPILLARY     Status: None     Collection Time      11/27/12  8:04 AM       Result  Value  Range     Glucose-Capillary  72   70 - 99 mg/dL   CBC     Status: Abnormal     Collection Time      11/27/12  9:05 AM       Result  Value  Range     WBC  3.4 (*)  4.0 - 10.5 K/uL     RBC  5.07   3.87 - 5.11 MIL/uL     Hemoglobin  15.4 (*)  12.0 - 15.0 g/dL     HCT  16.1   09.6 - 46.0 %     MCV  90.7   78.0 - 100.0 fL     MCH  30.4   26.0 - 34.0 pg     MCHC  33.5   30.0 - 36.0 g/dL     RDW  04.5   40.9 - 15.5 %     Platelets  192   150 - 400 K/uL   GLUCOSE, CAPILLARY     Status: Abnormal     Collection Time      11/27/12 11:55 AM       Result  Value  Range     Glucose-Capillary  100 (*)  70 - 99 mg/dL   HEPARIN LEVEL (UNFRACTIONATED)     Status: None     Collection Time      11/27/12 12:23 PM       Result  Value  Range     Heparin Unfractionated  0.43   0.30 - 0.70 IU/mL     Comment:                IF HEPARIN RESULTS ARE BELOW        EXPECTED VALUES, AND PATIENT        DOSAGE HAS BEEN CONFIRMED,         SUGGEST FOLLOW UP TESTING        OF ANTITHROMBIN III LEVELS.   PROTIME-INR     Status: Abnormal     Collection  Time      11/27/12 12:23 PM       Result  Value  Range     Prothrombin Time  17.3 (*)  11.6 - 15.2 seconds     INR  1.45   0.00 - 1.49   GLUCOSE, CAPILLARY     Status: None     Collection Time      11/27/12  4:11 PM       Result  Value  Range     Glucose-Capillary  96   70 - 99 mg/dL   GLUCOSE, CAPILLARY     Status: None     Collection Time      11/27/12  9:18 PM       Result  Value  Range     Glucose-Capillary  81   70 - 99 mg/dL     Comment 1  Documented in Chart        Comment 2  Notify RN      HEPARIN LEVEL (UNFRACTIONATED)     Status: None     Collection Time      11/27/12 11:00 PM       Result  Value  Range     Heparin Unfractionated  0.31   0.30 - 0.70 IU/mL     Comment:                IF HEPARIN RESULTS ARE BELOW        EXPECTED VALUES, AND PATIENT        DOSAGE HAS BEEN CONFIRMED,        SUGGEST FOLLOW UP TESTING        OF ANTITHROMBIN III LEVELS.   GLUCOSE, CAPILLARY     Status: Abnormal     Collection Time      11/28/12  6:28 AM       Result  Value  Range     Glucose-Capillary  153 (*)  70 - 99 mg/dL     Comment 1  Documented in Chart        Comment 2  Notify RN      CBC     Status: Abnormal     Collection Time      11/28/12  7:16 AM       Result  Value  Range     WBC  3.4 (*)  4.0 - 10.5 K/uL     RBC  4.25   3.87 - 5.11 MIL/uL     Hemoglobin  13.2   12.0 - 15.0 g/dL     HCT  41.3   24.4 - 46.0 %     MCV  89.4   78.0 - 100.0 fL     MCH  31.1   26.0 - 34.0 pg     MCHC  34.7   30.0 - 36.0 g/dL     RDW  01.0   27.2 - 15.5 %     Platelets  201   150 - 400 K/uL   PROTIME-INR     Status: Abnormal     Collection Time      11/28/12  7:16 AM       Result  Value  Range     Prothrombin Time  20.4 (*)  11.6 - 15.2 seconds     INR  1.80 (*)  0.00 - 1.49   HEPARIN LEVEL (UNFRACTIONATED)     Status: Abnormal     Collection Time      11/28/12  7:16  AM        Result  Value  Range     Heparin Unfractionated  0.25 (*)  0.30 - 0.70 IU/mL     Comment:                IF HEPARIN RESULTS ARE BELOW        EXPECTED VALUES, AND PATIENT        DOSAGE HAS BEEN CONFIRMED,        SUGGEST FOLLOW UP TESTING        OF ANTITHROMBIN III LEVELS.    Dg Swallowing Func-speech Pathology     Post Admission Physician Evaluation: Functional deficits secondary  to acute left cerebellar embolic infarct. Patient is admitted to receive collaborative, interdisciplinary care between the physiatrist, rehab nursing staff, and therapy team. Patient's level of medical complexity and substantial therapy needs in context of that medical necessity cannot be provided at a lesser intensity of care such as a SNF. Patient has experienced substantial functional loss from his/her baseline which was documented above under the "Functional History" and "Functional Status" headings.  Judging by the patient's diagnosis, physical exam, and functional history, the patient has potential for functional progress which will result in measurable gains while on inpatient rehab.  These gains will be of substantial and practical use upon discharge  in facilitating mobility and self-care at the household level. Physiatrist will provide 24 hour management of medical needs as well as oversight of the therapy plan/treatment and provide guidance as appropriate regarding the interaction of the two. 24 hour rehab nursing will assist with bladder management, bowel management, safety, skin/wound care, disease management, medication administration, pain management and patient education  and help integrate therapy concepts, techniques,education, etc. PT will assess and treat for/with: pre gait, gait training, endurance , safety, equipment, neuromuscular re education.   Goals are:  supervision level . OT will assess and treat for/with: ADLs, Cognitive perceptual skills, Neuromuscular re education, safety,  endurance, equipment.   Goals are:  supervision level . SLP will assess and treat for/with:  swallowing, dysarthria .  Goals are:  express basic needs with 100% intelligibility, upgrade fluid and solids diet restrictions . Case Management and Social Worker will assess and treat for psychological issues and discharge planning. Team conference will be held weekly to assess progress toward goals and to determine barriers to discharge. Patient will receive at least 3 hours of therapy per day at least 5 days per week. ELOS: 1-2 wks        Prognosis:  good     Medical Problem List and Plan: 1. Embolic CVA right posterior temporal and left cerebellum 2. DVT Prophylaxis/Anticoagulation: Coumadin therapy. Monitor for any bleeding episodes. Continue heparin until INR between 2.00 and 3.0 3. Pain Management: Tylenol as needed 4. Dysphagia. Dysphagia 1 nectar liquids. Followup speech therapy. Monitor for any signs of aspiration 5. Neuropsych: This patient is not capable of making decisions on her own behalf. 6. Small non-Q wave myocardial infarction/severe nonischemic cardiomyopathy/apical thrombus. Continue conservative care and maintain Coumadin therapy. Followup cardiology services 7. Hypertension. Altace 2.5 mg daily, Coreg 3.125 mg twice a day. Monitor with increased mobility 8. History of polysubstance abuse. Urine drug screen negative.  Erick Colace M.D. [facility] FAAPM&R (Sports Med, Neuromuscular Med) Diplomate Am Board of Electrodiagnostic Med Diplomate Am Board of Pain Medicine Fellow Am Board of Interventional Pain Physicians 11/28/2012

## 2012-11-29 NOTE — Progress Notes (Signed)
Speech Language Pathology Treatment: Dysphagia;Cognitive-Linquistic  Patient Details Name: [name] MRN: [id number] DOB: 04-27-1945 Today's Date: 11/29/2012 Time: 6387-5643 SLP Time Calculation (min): 25 min  Assessment / Plan / Recommendation Clinical Impression   Reviewed and practiced strategies to increase intelligibility (slow rate, speaking 1 word at a time, over articulation). Pt able to implement these strategies at the individual word level, but is unable to then apply them to functional verbal exchanges.  Pt was given simple communication board to assist caregivers with effective communication, however, functional use will require diligence on the part of the listener, given poor carryover on pt's part. Staff cues to speak one word at a time and overarticulate will be helpful, however, pt will likely have difficulty with this task.   HPI  Please refer to H&P, and initial SLP evaluations   Pertinent Vitals stable  SLP Plan  Continue with current plan of care    Recommendations Diet recommendations: Dysphagia 1 (puree);Nectar-thick liquid Liquids provided via: Straw Medication Administration: Whole meds with puree Supervision: Full supervision/cueing for compensatory strategies Compensations: Slow rate;Small sips/bites;Check for pocketing;Check for anterior loss;Multiple dry swallows after each bite/sip Postural Changes and/or Swallow Maneuvers: Seated upright 90 degrees;Upright 30-60 min after meal              Oral Care Recommendations: Oral care before and after PO Follow up Recommendations: Skilled Nursing facility;24 hour supervision/assistance Plan: Continue with current plan of care    GO   Georgina Krist B. Ulmer, [facility], CCC-SLP [phone]   Leigh Aurora 11/29/2012, 1:55 PM

## 2012-11-29 NOTE — Discharge Summary (Signed)
Physician Discharge Summary  Patient ID: [name] MRN: [id number] DOB/AGE: May 06, 1945 67 y.o.  Admit date: 11/24/2012 Discharge date: 11/29/2012  Admission Diagnoses: Status post acute left cerebellar and right posterior temporal infarct  Severe nonischemic dilated cardiomyopathy  Hypertension  COPD  Tobacco abuse  Hypercholesteremia  History of dementia  History of schizophrenia  History of CVA of breast  History of polysubstance abuse  Cachexia  Discharge Diagnoses:  Principle Problem: * Status post acute left cerebellar and right posterior temporal infarct* Status post TPA probably cardioembolic  Probable cardioembolic small non-Q-wave myocardial infarction  Severe nonischemic dilated cardiomyopathy  Hypertension  COPD  Tobacco abuse  Hypercholesteremia  History of dementia  History of schizophrenia  History of CVA of breast  History of polysubstance abuse  Cachexia  Discharged Condition: fair  Hospital Course: [name] is a 67 y.o. female who was apparently eating a english muffin earlier when she became suddenly dysarthric. She has had multiple strokes in the past and volunteers that she is not taking any medications. Her MRI showed acute infarction affects the left cerebellum and right posterior temporal regions. She received TPA.  2-D echo showed apical thrombus with severely depressed LV systolic function and global hypokinesia. Rehab consult by Dr. Riley Kill recommended inpatient rehab program.   Consults: rehabilitation medicine  Significant Diagnostic Studies: labs: Near normal CBC, mildly elevated Creatinine. HgA1C of 6.2, Normal Lipid panel.   MRI brain: Acute infarction affects the left cerebellum and right posterior temporal regions.   MRA brain: No proximal ICA, MCA, basilar, or vertebral flow reducing lesion. Mild intracranial atherosclerotic change of the distal MCA vessels may have slightly progressed from 2013. Stable 2-3 mm right PCOM  aneurysm.  2-D echocardiogram: - Left ventricle: The cavity size was moderately dilated. The estimated ejection fraction was 15%, in the range of 10% to 15%. There was a small, apical thrombus. - Mitral valve: Mild regurgitation. - Atrial septum: No defect or patent foramen ovale was identified. - Pericardium, extracardiac: A trivial pericardial effusion was identified.  Carotid doppler:- Mild technical difficulty due to respiratory interference - Bilateral - 1% to 39% ICA stenosis. Vertebral artery flow is antegrade. - No significant change since study of 06/25/2012.  EKG-Sinus bradycardia and LBBB.  Treatments: anticoagulation: heparin, warfarin and TPA + PT and OT.  Discharge Exam: Blood pressure 119/75, pulse 54, temperature 97.4 F (36.3 C), temperature source Oral, resp. rate 20, height 5\' 1"  (1.549 m), weight 40 kg (88 lb 2.9 oz), SpO2 100.00%. Constitutional: Frail elderly female appearing older than stated age.  HENT: Normocephalic. Eyes: EOM are normal.  Neck: Normal range of motion. Neck supple. No thyromegaly present.  Pulmonary/Chest: breath sounds clear to auscultation  Abdominal: Soft. Bowel sounds are normal. She exhibits no distension.  Neurological: She is alert. Patient with significant dysarthria but most words intelligible with extra time. Left upper limb ataxia. Strength LUE is grossly 3+ to 4/5. LLE is 1-2/5 HF and KE (apraxic?) and tr to 1 distally. Heel cord tight. She did follow simple commands. Patient oriented x 3 to basic questions. Left facial weakness.  Skin: Skin is warm and dry.  Psychiatric: She has a normal mood and affect.   Disposition: 70-Inpatient rehabilitation     Medication List    ASK your doctor about these medications       budesonide-formoterol 80-4.5 MCG/ACT inhaler  Commonly known as:  SYMBICORT  Inhale 2 puffs into the lungs 2 (two) times daily.     clopidogrel 75  MG tablet  Commonly known as:  PLAVIX  Take 75 mg by mouth daily.      furosemide 40 MG tablet  Commonly known as:  LASIX  Take 40 mg by mouth daily.     nitroGLYCERIN 0.4 MG SL tablet  Commonly known as:  NITROSTAT  Place 0.4 mg under the tongue every 5 (five) minutes as needed. For chest pain     OLANZapine 2.5 MG tablet  Commonly known as:  ZYPREXA  Take 1 tablet (2.5 mg total) by mouth at bedtime.     potassium chloride 10 MEQ tablet  Commonly known as:  K-DUR  Take 10 mEq by mouth daily.     PROVENTIL 90 MCG/ACT inhaler  Generic drug:  albuterol  - Inhale 2 puffs into the lungs every 6 (six) hours as needed. For wheezing  -      QUEtiapine 50 MG tablet  Commonly known as:  SEROQUEL  Take 50 mg by mouth daily.           Follow-up Information   Follow up with Gates Rigg, MD In 2 months. (in stroke clinic)    Specialties:  Neurology, Radiology   Contact information:   [address] [place] [address] [phone]       Signed: Ricki Rodriguez 11/29/2012, 9:49 AM

## 2012-11-29 NOTE — Progress Notes (Signed)
Report given to . Informed RN that patient's IV was out of date. IV was attempted and unsuccessful. IV team paged and given patient's new room number in inpatient rehab.

## 2012-11-29 NOTE — Progress Notes (Signed)
ANTICOAGULATION CONSULT NOTE - Follow Up Consult  Pharmacy Consult for Heparin and warfarin Indication: cardioembolic CVA with apical thrombus   Patient Measurements: Height: 5\' 1"  (154.9 cm) Weight: 88 lb 2.9 oz (40 kg) IBW/kg (Calculated) : 47.8 Heparin dosing weight: 40kg  Vital Signs: Temp: 97.4 F (36.3 C) (10/09 0919) Temp src: Oral (10/09 0919) BP: 119/75 mmHg (10/09 0919) Pulse Rate: 54 (10/09 0919)  Labs:  Recent Labs  11/27/12 0905 11/27/12 1223  11/28/12 0716 11/28/12 2000 11/29/12 0740  HGB 15.4*  --   --  13.2  --  15.7*  HCT 46.0  --   --  38.0  --  45.5  PLT 192  --   --  201  --  216  LABPROT  --  17.3*  --  20.4*  --  20.9*  INR  --  1.45  --  1.80*  --  1.86*  HEPARINUNFRC  --  0.43  < > 0.25* 0.51 0.74*  < > = values in this interval not displayed.  Estimated Creatinine Clearance: 26.5 ml/min (by C-G formula based on Cr of 1.3).   Assessment: 67 yo female who presented to the ED on 10/4 with dysarthria and slurred speech and was given tPA at for confirmed infarcts on MRI. 2D echo also reports a small apical thrombus. Pharmacy consulted to begin a heparin drip as a bridge to warfarin. INR increased to 1.86 today. Heparin level is 0.74 which is above her goal of 0.3-0.5.   Goal of Therapy:  INR 2-3 Heparin level 0.3-0.5 units/ml Monitor platelets by anticoagulation protocol: Yes   Plan:  1. Decrease Heparin to 550 units/hr  2. Warfarin 4mg  PO tonight 3. Heparin level in 8 hours 4. Daily INR, CBC, and heparin level 5. Monitor for s/sx of bleeding  Celedonio Miyamoto, PharmD, [facility] Clinical Pharmacist Pager [phone]   11/29/2012 9:58 AM

## 2012-11-29 NOTE — PMR Pre-admission (Signed)
PMR Admission Coordinator Pre-Admission Assessment  Patient: [name] is an 67 y.o., female MRN: [id number] DOB: 1945/09/06 Height: 5\' 1"  (154.9 cm) Weight: 40 kg (88 lb 2.9 oz)              Insurance Information HMO:    PPO:      PCP:      IPA:      80/20: yes     OTHER:  No HMO PRIMARY: Medicare a and b      Policy#: [id number]      Subscriber: pt Benefits:  Phone #: [email or web address]     Name: 11/28/12 Eff. Date: a 03/24/10 b 02/21/10     Deduct: $1216      Out of Pocket Max: none      Life Max: none CIR: 100%      SNF: 20 full days LBD 06/27/12 Outpatient: 80%     Co-Pay: 20% Home Health: 100%      Co-Pay: none DME: 80%     Co-Pay: 20% Providers: pt choice  SECONDARY: Medicaid  Access      Policy#: [id number]      Subscriber: pt  Emergency Contact Information Contact Information   Name Relation Home Work Mobile   Robbins Niece   [phone]   [name] Sister [phone]  [phone]   Valentina Gu   [phone]     Current Medical History  Patient Admitting Diagnosis: embolic cva right posterior temporal and left cerebellum  History of Present Illness:[name] is a 67 y.o. 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   Admitted 11/24/2012 with slurred speech. MRI of the brain showed acute infarcts affecting the left cerebellum and right posterior temporal regions. Also noted extensive encephalomalacia related to remote bihemispheric infarction. 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 Echocardiogram with ejection fraction of 15% no wall motion abnormalities and small apical thrombus. Neurology services consulted. Patient did receive TPA.   Findings of elevated  troponin/NSTEMI and followup cardiology services. No significant changes in EKG. Patient placed on Coumadin therapy for cardioembolic CVA with apical thrombus. Conservative medical management per cardiology services. Admission urine drug screen was negative. Patient currently maintained on a dysphagia 1 nectar thick liquid diet.     Total: 8 NIH    Past Medical History  Past Medical History  Diagnosis Date  . Weight loss, unintentional   . Trouble swallowing   . Change in voice   . Substance abuse   . Dementia   . Asthma   . Shortness of breath   . Seizures     "    It has been along time "  . CHF (congestive heart failure)   . Headache(784.0)   . Hypertension   . Arthralgia   . Tremors of nervous system   . Anxiety   . Rib fractures     hx of May 2012  . Heart attack 05/21/10  . Schizophrenia   . Coronary artery disease   . Stroke 07/11/10    hx of R CVA   . COPD (chronic obstructive pulmonary disease)   . Obstructive sleep apnea   . Breast cancer 01/28/11    L breast, inv ductal/in situ, ER/PR -, Her2 -    Family History  family history includes Birth defects in her mother; Breast cancer in her mother; Cancer in her brother, maternal grandmother, mother, and paternal aunt; Heart attack in her brother and father; Heart disease in her father.  Prior Rehab/Hospitalizations: CIR 09/2012. I discussed with sister, Jonna Clark, and her daughter, Bjorn Loser that family education last CIR admit was an issue up unitll pt d/c and that Bjorn Loser needed to be involved this admission to plan for d/c. Bjorn Loser stated understanding. I discussed need for SNF if caregiver support not available on rehab.  Current Medications  Current facility-administered medications:0.9 %  sodium chloride infusion, , Intravenous, Continuous, Ritta Slot, MD, Last Rate: 50 mL/hr at 11/26/12 1610;  acetaminophen (TYLENOL) suppository 650 mg, 650 mg, Rectal, Q4H PRN, Ritta Slot, MD;  acetaminophen (TYLENOL)  tablet 650 mg, 650 mg, Oral, Q4H PRN, Ritta Slot, MD, 650 mg at 11/27/12 1614 antiseptic oral rinse (BIOTENE) solution 15 mL, 15 mL, Mouth Rinse, q12n4p, Lonia Farber, MD, 15 mL at 11/28/12 1600;  carvedilol (COREG) tablet 3.125 mg, 3.125 mg, Oral, BID WC, Robynn Pane, MD, 3.125 mg at 11/29/12 0834;  chlorhexidine (PERIDEX) 0.12 % solution 15 mL, 15 mL, Mouth Rinse, BID, Lonia Farber, MD, 15 mL at 11/29/12 0834 diphenhydrAMINE (BENADRYL) capsule 25 mg, 25 mg, Oral, Q6H PRN, Thana Farr, MD, 25 mg at 11/28/12 2329;  feeding supplement (ENSURE COMPLETE) (ENSURE COMPLETE) liquid 237 mL, 237 mL, Oral, BID BM, Heather Cornelison Pitts, RD, 237 mL at 11/28/12 1833;  heparin ADULT infusion 100 units/mL (25000 units/250 mL), 650 Units/hr, Intravenous, Continuous, Lauren Bajbus, RPH, Last Rate: 6.5 mL/hr at 11/28/12 1854, 650 Units/hr at 11/28/12 1854 levalbuterol (XOPENEX) nebulizer solution 0.63 mg, 0.63 mg, Nebulization, Q3H PRN, Robynn Pane, MD, 0.63 mg at 11/29/12 0853;  pantoprazole (PROTONIX) EC tablet 40 mg, 40 mg, Oral, QHS, Ritta Slot, MD, 40 mg at 11/28/12 2150;  ramipril (ALTACE) capsule 2.5 mg, 2.5 mg, Oral, Daily, Robynn Pane, MD, 2.5 mg at 11/28/12 1002;  RESOURCE THICKENUP CLEAR, , Oral, PRN, Ritta Slot, MD Warfarin - Pharmacist Dosing Inpatient, , Does not apply, q1800, Lovell Sheehan, RPH  Patients Current Diet: Dysphagia 1 diet with nectar thick liquids.  Precautions / Restrictions Precautions Precautions: Fall Restrictions Weight Bearing Restrictions: No   Prior Activity Level Limited Community (1-2x/wk): uses RW inhouse and hoverround outside  [employer] / Equipment Home Assistive Devices/Equipment: None Home Equipment: Bedside commode;Walker - 2 wheels;Cane - single point;Shower seat  Prior Functional Level Prior Function Level of Independence: Independent with assistive device(s) Comments:  walks inside with RW independently; outside with supervision  Current Functional Level Cognition  Arousal/Alertness: Awake/alert Overall Cognitive Status: No family/caregiver present to determine baseline cognitive functioning Current Attention Level: Sustained Orientation Level: Oriented to person;Disoriented to time;Disoriented to situation;Disoriented to place Following Commands: Follows one step commands consistently Safety/Judgement: Decreased awareness of safety;Decreased awareness of deficits Attention: Focused;Sustained Focused Attention: Impaired Focused Attention Impairment: Verbal basic;Functional basic Sustained Attention: Impaired Sustained Attention Impairment: Verbal basic;Functional basic Memory: Impaired Memory Impairment: Decreased recall of new information Awareness: Impaired Awareness Impairment: Intellectual impairment;Emergent impairment;Anticipatory impairment Problem Solving: Impaired Problem Solving Impairment: Verbal basic;Functional basic Executive Function: Reasoning;Sequencing;Self Monitoring Reasoning: Impaired Reasoning Impairment: Verbal basic;Functional basic Sequencing: Impaired Sequencing Impairment: Verbal basic;Functional basic Self Monitoring: Impaired Self Monitoring Impairment: Verbal basic;Functional basic    Extremity Assessment (includes Sensation/Coordination)          ADLs  Grooming: Minimal assistance Where Assessed - Grooming: Unsupported sitting Upper Body Bathing: Minimal assistance Where Assessed -  Upper Body Bathing: Unsupported sitting Lower Body Bathing: Moderate assistance Where Assessed - Lower Body Bathing: Supported sit to stand Upper Body Dressing: Minimal assistance Where Assessed - Upper Body Dressing: Unsupported sitting Lower Body Dressing: Moderate assistance Where Assessed - Lower Body Dressing: Supported sit to Pharmacist, hospital: Moderate assistance Toilet Transfer Method: Stand pivot Toilet Transfer  Equipment: Other (comment) (simulated) Toileting - Clothing Manipulation and Hygiene: Minimal assistance Where Assessed - Toileting Clothing Manipulation and Hygiene: Sit to stand from 3-in-1 or toilet Equipment Used: Gait belt;Rolling walker Transfers/Ambulation Related to ADLs: min A sit - stand. MOd A for beginning mobility ADL Comments: demonstrates a funcitonal decline    Mobility  Bed Mobility: Supine to Sit;Sit to Supine Supine to Sit: 5: Supervision Sit to Supine: 5: Supervision    Transfers  Transfers: Sit to Stand;Stand to Sit Sit to Stand: 4: Min assist;From bed Stand to Sit: 4: Min assist;To chair/3-in-1    Ambulation / Gait / Stairs / Psychologist, prison and probation services  Ambulation/Gait Ambulation/Gait Assistance: 1: +2 Total assist Ambulation/Gait: Patient Percentage: 60% Ambulation Distance (Feet): 20 Feet Assistive device: Rolling walker Ambulation/Gait Assistance Details: +2 A as one patient control and postioning L LE with stance and advancement. Contniues to be ataxic with LLE movement and postioning Gait Pattern: Step-to pattern;Decreased stance time - left;Decreased step length - left;Decreased hip/knee flexion - left;Decreased dorsiflexion - right;Ataxic;Narrow base of support;Lateral trunk lean to left Gait velocity: slow General Gait Details: pt at increased falls risk. pt fatigue quickly requiring to sit down Stairs: No    Posture / Balance Static Sitting Balance Static Sitting - Balance Support: Feet supported Static Sitting - Level of Assistance: 6: Modified independent (Device/Increase time) Static Standing Balance Static Standing - Balance Support: Bilateral upper extremity supported Static Standing - Level of Assistance: 4: Min assist Static Standing - Comment/# of Minutes: weight shifting in standing to initiate placement of heel and weight through LLE Dynamic Standing Balance Dynamic Standing - Balance Support: Bilateral upper extremity supported;During functional  activity Dynamic Standing - Level of Assistance: 3: Mod assist    Special needs/care consideration Continuous Drip IV Iv Heparin bridge to coumadin Bowel mgmt: continent Bladder mgmt: continent   Previous Home Environment Living Arrangements: Other (Comment) (niece, Astrid Drafts, is caregiver)  Lives With: Other (Comment) (niece lives in pt's home) Available Help at Discharge: Family;Available 24 hours/day Type of Home: Apartment Home Layout: One level Home Access: Stairs to enter [employer] of Steps: 1 Bathroom Shower/Tub: Engineer, manufacturing systems: Standard Bathroom Accessibility: Yes How Accessible: Accessible via walker Home Care Services: No Additional Comments: Pt Mod I with RW in home. Bathes, dresses, and ambulates self. Hover round in community. Niece cooks, Lexicographer, and manages meds  Discharge Living Setting Plans for Discharge Living Setting: Patient's home;Apartment;Lives with (comment) (Niece lives with pt) Type of Home at Discharge: Apartment Discharge Home Layout: One level Discharge Home Access: Stairs to enter Entrance Stairs-Rails: None Entrance Stairs-Number of Steps: 1 Discharge Bathroom Shower/Tub: Tub/shower unit Discharge Bathroom Toilet: Standard Discharge Bathroom Accessibility: Yes How Accessible: Accessible via walker Does the patient have any problems obtaining your medications?: No  Social/Family/Support Systems Contact Information: Marcelle Smiling, [phone] and pt's sister Dolores Patty [phone], Rhonda's Mom Anticipated Caregiver: Bjorn Loser Anticipated Caregiver's Contact Information: see above Ability/Limitations of Caregiver: no limitations Caregiver Availability: 24/7 Discharge Plan Discussed with Primary Caregiver: Yes Is Caregiver In Agreement with Plan?: Yes Does Caregiver/Family have Issues with Lodging/Transportation while Pt is in Rehab?: No    Goals/Additional Needs  Patient/Family Goal for Rehab: supervision  to min assist with PT, OT, and SLP Expected length of stay: ELOS 12 to 17 days Dietary Needs: Dysphagia 1 with nectar thick liquids Pt/Family Agrees to Admission and willing to participate: Yes Program Orientation Provided & Reviewed with Pt/Caregiver Including Roles  & Responsibilities: Yes   Decrease burden of Care through IP rehab admission:n/a  Possible need for SNF placement upon discharge:no  Patient Condition: This patient's condition remains as documented in the consult dated 11/28/2012, in which the Rehabilitation Physician determined and documented that the patient's condition is appropriate for intensive rehabilitative care in an inpatient rehabilitation facility. Will admit to inpatient rehab today.  Preadmission Screen Completed By:  Clois Dupes, 11/29/2012 9:57 AM ______________________________________________________________________   Discussed status with Dr. Riley Kill on 11/29/2012 at  1006 and received telephone approval for admission today.  Admission Coordinator:  Clois Dupes, time 1006 Date 11/29/2012.

## 2012-11-29 NOTE — Progress Notes (Signed)
Inpatient rehabilitation bed is available today and I can admit pt. I contacted Dr. Algie Coffer and he will see pt this morning to clarify medical readiness to admit. [phone]

## 2012-11-30 ENCOUNTER — Inpatient Hospital Stay (HOSPITAL_COMMUNITY): Payer: Medicare Other | Admitting: *Deleted

## 2012-11-30 ENCOUNTER — Inpatient Hospital Stay (HOSPITAL_COMMUNITY): Payer: Medicare Other

## 2012-11-30 DIAGNOSIS — G8110 Spastic hemiplegia affecting unspecified side: Secondary | ICD-10-CM

## 2012-11-30 DIAGNOSIS — I6340 Cerebral infarction due to embolism of unspecified cerebral artery: Secondary | ICD-10-CM

## 2012-11-30 DIAGNOSIS — I69921 Dysphasia following unspecified cerebrovascular disease: Secondary | ICD-10-CM

## 2012-11-30 LAB — COMPREHENSIVE METABOLIC PANEL
ALT: 11 U/L (ref 0–35)
AST: 27 U/L (ref 0–37)
Albumin: 3.3 g/dL — ABNORMAL LOW (ref 3.5–5.2)
Alkaline Phosphatase: 79 U/L (ref 39–117)
BUN: 12 mg/dL (ref 6–23)
Chloride: 103 mEq/L (ref 96–112)
GFR calc non Af Amer: 64 mL/min — ABNORMAL LOW (ref >90)
Glucose, Bld: 77 mg/dL (ref 70–99)
Potassium: 4.6 mEq/L (ref 3.5–5.1)
Sodium: 139 mEq/L (ref 135–145)
Total Bilirubin: 0.1 mg/dL — ABNORMAL LOW (ref 0.3–1.2)

## 2012-11-30 LAB — CBC WITH DIFFERENTIAL/PLATELET
Basophils Relative: 1 % (ref 0–1)
Hemoglobin: 13.6 g/dL (ref 12.0–15.0)
Lymphs Abs: 1 10*3/uL (ref 0.7–4.0)
MCHC: 33.6 g/dL (ref 30.0–36.0)
Monocytes Relative: 11 % (ref 3–12)
Neutro Abs: 2.7 10*3/uL (ref 1.7–7.7)
Neutrophils Relative %: 63 % (ref 43–77)
RBC: 4.45 MIL/uL (ref 3.87–5.11)
RDW: 13.5 % (ref 11.5–15.5)

## 2012-11-30 LAB — PROTIME-INR: Prothrombin Time: 23.5 seconds — ABNORMAL HIGH (ref 11.6–15.2)

## 2012-11-30 MED ORDER — ALPRAZOLAM 0.25 MG PO TABS
0.2500 mg | ORAL_TABLET | ORAL | Status: DC | PRN
  Administered 2012-11-30 – 2012-12-11 (×19): 0.25 mg via ORAL
  Filled 2012-11-30 (×19): qty 1

## 2012-11-30 MED ORDER — WARFARIN SODIUM 4 MG PO TABS
4.0000 mg | ORAL_TABLET | Freq: Once | ORAL | Status: AC
  Administered 2012-11-30: 4 mg via ORAL
  Filled 2012-11-30: qty 1

## 2012-11-30 NOTE — Progress Notes (Addendum)
Occupational Therapy Assessment and Plan  Patient Details  Name: [name] MRN: [id number] Date of Birth: 12/01/1945  OT Diagnosis: ataxia, cognitive deficits, hemiplegia affecting non-dominant side and muscle weakness (generalized) Rehab Potential:  fair  ELOS:   2-3 weeks   Today's Date: 11/30/2012 Time: 0930-1030 Time Calculation (min): 60 min  Problem List:  Patient Active Problem List   Diagnosis Date Noted  . Embolic cerebral infarction 11/29/2012  . Protein-calorie malnutrition, severe 11/27/2012  . Stroke 11/24/2012  . Acute ischemic stroke 06/24/2012  . CVA (cerebral infarction) 10/11/2011  . Chest pain at rest 04/08/2011    Class: Acute  . Hypertension   . Arthralgia   . Breast cancer, stage 2 02/10/2011  . Trouble swallowing   . Breast cancer 01/28/2011  . CHF 12/25/2009  . Cerebral embolism with cerebral infarction 05/26/2009  . 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 LIVER FUNCTION TESTS, ABNORMAL, HX OF 05/14/2009  . CONSTIPATION 04/13/2009  . HYPERLIPIDEMIA 04/06/2009  . SCHIZOPHRENIA 04/06/2009  . ANXIETY 04/06/2009  . COCAINE ABUSE 04/06/2009  . HYPERTENSION, BENIGN ESSENTIAL 04/06/2009  . MYOCARDIAL INFARCTION 04/06/2009  . ASTHMA 04/06/2009  . COPD 04/06/2009  . CHOLELITHIASIS 04/06/2009  . RENAL FAILURE, ACUTE 04/06/2009  . CAD 03/31/2009    Past Medical History:  Past Medical History  Diagnosis Date  . Weight loss, unintentional   . Trouble swallowing   . Change in voice   . Substance abuse   . Dementia   . Asthma   . Shortness of breath   . Seizures     "    It has been along time "  . CHF (congestive heart failure)   . Headache(784.0)   . Hypertension   . Arthralgia   . Tremors of nervous system   . Anxiety   . Rib fractures     hx of May 2012  . Heart attack 05/21/10  . Schizophrenia   . Coronary artery disease   . Stroke 07/11/10    hx of R CVA   . COPD (chronic obstructive pulmonary disease)   .  Obstructive sleep apnea   . Breast cancer 01/28/11    L breast, inv ductal/in situ, ER/PR -, Her2 -   Past Surgical History:  Past Surgical History  Procedure Laterality Date  . Total abdominal hysterectomy  age 51    Assessment & Plan Clinical Impression: Patient is a 67 y.o. year old female with history of thrombotic right MCA infarct and received inpatient rehabilitation services 10/11/2011 until 10/22/2011 as well as history of seizure, substance abuse, hypertension, COPD and nonischemic cardiomyopathy secondary to cocaine abuse. Admitted 11/24/2012 with slurred speech. MRI of the brain showed acute infarcts affecting the left cerebellum and right posterior temporal regions. Also noted extensive encephalomalacia related to remote bihemispheric infarction. 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 Echocardiogram with ejection fraction of 15% no wall motion abnormalities and small apical thrombus. Carotid Dopplers with no ICA stenosis. Neurology services consulted. Patient did receive TPA. Findings of elevated troponin/small non-Q-wave myocardial infarction and followup cardiology services. No significant changes in EKG. Patient placed on Coumadin therapy for cardioembolic CVA with apical thrombus. Conservative medical management per cardiology services. Admission urine drug screen was negative. Patient currently maintained on a dysphagia 1 nectar thick liquid diet. Patient transferred to CIR on 11/29/2012 .    Patient currently requires total with toileting and min assist  bathing and dressing secondary to decreased cardiorespiratoy endurance, impaired timing and sequencing, unbalanced muscle activation, ataxia and decreased coordination, decreased attention to left and decreased attention, decreased awareness, decreased problem solving, decreased safety awareness and decreased memory.  Prior to hospitalization, patient could complete  BADLs with modified independent .  Patient will benefit from skilled intervention to increase independence with basic self-care skills prior to discharge home with niece.  Anticipate patient will require 24 hour supervision and follow up home health.      Skilled Therapeutic Intervention RN taking vitals upon arrival and BP was 109/75 with HR 48. RN calling PA and informed OT to continue eval.Therapy session focused on functional transfers, attention to task, sit<>stand, and activity tolerance during ADL retraining. Pt received supine in bed mumbling and unable to understand request. Asked if needed to use bathroom and pt verbalized yes. Completed stand pivot transfer bed>BSC (to left) with mod assist and pt not wanting to weight bear through LLE. Pt very fearful during transfer. Required min assist for sit<>stand from bsc and total assist for clothing management and hygiene. Completed stand pivot transfer back to bed (right side) with min assist. Pt agreeable to sponge bath. Stand pivot transfer to right bed>w/c with min assist. Engaged in sponge bath and dressing with min-mod assist for sit<>stands and cues and facilitation to attempt weight bearing through LLE. Pt required frequent cues to redirect to self-care tasks. Pt very difficult to understand throughout session however able ot verbalize pain in LUE but unable to inform location. AROM WFL and no indication of pain with it. Pt passed off to PT at end of session.   OT Evaluation Precautions/Restrictions  Precautions Precautions: Fall Restrictions Weight Bearing Restrictions: No General   Vital Signs Therapy Vitals Pulse Rate: 48 BP: 112/63 mmHg Pain Pain Assessment Pain Assessment: No/denies pain Pain Score: 0-No pain Home Living/Prior Functioning Home Living Available Help at Discharge: Family;Available 24 hours/day Type of Home: Apartment Home Access: Stairs to enter Entrance Stairs-Number of Steps: 1 Home Layout: One  level Additional Comments: Per acute note and H&P: Pt Mod I with RW in home. Bathes, dresses, and ambulates self. Hover round in community. Niece cooks, Lexicographer, and manages meds  Lives With: Other (Comment) (niece ) ADL   Vision/Perception  Vision - History Baseline Vision: Wears glasses only for reading Patient Visual Report: No change from baseline Vision - Assessment Eye Alignment: Within Functional Limits Additional Comments: difficult to formally assess secondary to cognitive linguistic deficits Perception Perception: Within Functional Limits Praxis Praxis: Intact  Cognition Overall Cognitive Status: No family/caregiver present to determine baseline cognitive functioning Arousal/Alertness: Awake/alert Orientation Level: Oriented to person;Oriented to place;Disoriented to time;Disoriented to situation Attention: Focused;Sustained Focused Attention: Impaired Focused Attention Impairment: Verbal basic;Functional basic Sustained Attention: Impaired Memory: Impaired Memory Impairment: Decreased recall of new information Awareness: Impaired Awareness Impairment: Intellectual impairment;Emergent impairment;Anticipatory impairment Problem Solving: Impaired Sequencing: Impaired Self Monitoring: Impaired Behaviors: Impulsive Safety/Judgment: Impaired Sensation Sensation Light Touch: Appears Intact Hot/Cold: Appears Intact Coordination Gross Motor Movements are Fluid and Coordinated: No Fine Motor Movements are Fluid and Coordinated: No Finger Nose Finger Test: decreased speed and accuracy with LUE; ataxia Motor    Mobility     Trunk/Postural Assessment     Balance   Extremity/Trunk Assessment RUE Assessment RUE Assessment: Within Functional Limits LUE Assessment LUE Assessment: Exceptions to WFL (ROM WFL; ataxia limiting coorination; slightly less strength )  FIM:  FIM - Grooming Grooming Steps: Wash, rinse, dry face;Wash, rinse, dry hands Grooming: 5:  Set-up  assist to obtain items FIM - Bathing Bathing Steps Patient Completed: Chest;Right Arm;Left Arm;Abdomen;Left upper leg;Right upper leg;Right lower leg (including foot);Left lower leg (including foot) Bathing: 4: Min-Patient completes 8-9 47f 10 parts or 75+ percent FIM - Upper Body Dressing/Undressing Upper body dressing/undressing: 0: Wears gown/pajamas-no public clothing FIM - Lower Body Dressing/Undressing Lower body dressing/undressing steps patient completed: Thread/unthread right underwear leg;Thread/unthread left underwear leg;Thread/unthread right pants leg;Thread/unthread left pants leg;Don/Doff right sock;Don/Doff left sock Lower body dressing/undressing: 4: Min-Patient completed 75 plus % of tasks FIM - Toileting Toileting: 1: Total-Patient completed zero steps, helper did all 3 FIM - Bed/Chair Transfer Bed/Chair Transfer: 4: Supine > Sit: Min A (steadying Pt. > 75%/lift 1 leg);4: Bed > Chair or W/C: Min A (steadying Pt. > 75%) FIM - Diplomatic Services operational officer Devices: Bedside commode Toilet Transfers: 3-To toilet/BSC: Mod A (lift or lower assist);4-From toilet/BSC: Min A (steadying Pt. > 75%)   Refer to Care Plan for Long Term Goals  Recommendations for other services: None  Discharge Criteria: Patient will be discharged from OT if patient refuses treatment 3 consecutive times without medical reason, if treatment goals not met, if there is a change in medical status, if patient makes no progress towards goals or if patient is discharged from hospital.  The above assessment, treatment plan, treatment alternatives and goals were discussed and mutually agreed upon: by patient  Daneil Dan 11/30/2012, 10:48 AM

## 2012-11-30 NOTE — Evaluation (Signed)
Speech Language Pathology Assessment and Plan  Patient Details  Name: [name] MRN: [id number] Date of Birth: [birth year]  SLP Diagnosis: Dysarthria;Cognitive Impairments;Speech and Language deficits;Dysphagia  Rehab Potential: Fair (unclear baseline level of functioning) ELOS: 2-3 weeks   Today's Date: 11/30/2012 Time: 1300-1400 Time Calculation (min): 60 min  Problem List:  Patient Active Problem List   Diagnosis Date Noted  . Embolic cerebral infarction 11/29/2012  . Protein-calorie malnutrition, severe 11/27/2012  . Stroke 11/24/2012  . Acute ischemic stroke 06/24/2012  . CVA (cerebral infarction) 10/11/2011  . Chest pain at rest 04/08/2011    Class: Acute  . Hypertension   . Arthralgia   . Breast cancer, stage 2 02/10/2011  . Trouble swallowing   . Breast cancer 01/28/2011  . CHF 12/25/2009  . Cerebral embolism with cerebral infarction 05/26/2009  . 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 LIVER FUNCTION TESTS, ABNORMAL, HX OF 05/14/2009  . CONSTIPATION 04/13/2009  . HYPERLIPIDEMIA 04/06/2009  . SCHIZOPHRENIA 04/06/2009  . ANXIETY 04/06/2009  . COCAINE ABUSE 04/06/2009  . HYPERTENSION, BENIGN ESSENTIAL 04/06/2009  . MYOCARDIAL INFARCTION 04/06/2009  . ASTHMA 04/06/2009  . COPD 04/06/2009  . CHOLELITHIASIS 04/06/2009  . RENAL FAILURE, ACUTE 04/06/2009  . CAD 03/31/2009   Past Medical History:  Past Medical History  Diagnosis Date  . Weight loss, unintentional   . Trouble swallowing   . Change in voice   . Substance abuse   . Dementia   . Asthma   . Shortness of breath   . Seizures     "    It has been along time "  . CHF (congestive heart failure)   . Headache(784.0)   . Hypertension   . Arthralgia   . Tremors of nervous system   . Anxiety   . Rib fractures     hx of May 2012  . Heart attack 05/21/10  . Schizophrenia   . Coronary artery disease   . Stroke 07/11/10    hx of R CVA   . COPD (chronic obstructive pulmonary disease)    . Obstructive sleep apnea   . Breast cancer 01/28/11    L breast, inv ductal/in situ, ER/PR -, Her2 -   Past Surgical History:  Past Surgical History  Procedure Laterality Date  . Total abdominal hysterectomy  age 67    Assessment / Plan / Recommendation Clinical Impression  Pt is a 67 y.o. 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 Admitted 11/24/2012 with slurred speech. MRI of the brain showed acute infarcts affecting the left cerebellum and right posterior temporal regions. Also noted extensive encephalomalacia related to remote bihemispheric infarction. 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 Echocardiogram with ejection fraction of 15% no wall motion abnormalities and small apical thrombus. Carotid Dopplers with no ICA stenosis. Neurology services consulted. Patient did receive TPA. Findings of elevated troponin/small non-Q-wave myocardial infarction and followup cardiology services. No significant changes in EKG. Patient placed on Coumadin therapy for cardioembolic CVA with apical thrombus. Conservative medical management per cardiology services. Admission urine drug screen was negative. Patient currently maintained on a dysphagia 1 nectar thick liquid diet. Physical therapy evaluation completed 11/27/2012 with recommendations for physical medicine rehabilitation consult to consider inpatient rehabilitation services. Patient was felt to  be acute candidate for inpatient rehabilitation services was admitted for comprehensive rehabilitation program 10/9. Cognitive-linguistic and bedside swallow evaluations completed 10/10. PO trials of nectar thick liquids and pureed solids revealed decreased oral manipulation,  containment, and bolus cohesion. Cough x1 was noted after initial trial of nectar thick liquid. Will continue to monitor on current diet. Pt's speech is moderately-severely dysarthric with intelligibility subjectively judged to be 25-50% at the sentence level. Cognitive-linguistic impairments include sustained attention, orientation to time/situation, intellectual awareness, recall of new information, safety/judgment, and confrontational naming. Pt will benefit from SLP services during this admission to maximize communication, swallowing safety, and cognitive function prior to discharge.    SLP Assessment  Patient will need skilled Speech Lanaguage Pathology Services during CIR admission    Recommendations  Diet Recommendations: Dysphagia 1 (Puree);Nectar-thick liquid Liquid Administration via: Cup Medication Administration: Crushed with puree Supervision: Full supervision/cueing for compensatory strategies;Patient able to self feed Compensations: Slow rate;Small sips/bites;Check for pocketing;Check for anterior loss;Multiple dry swallows after each bite/sip Postural Changes and/or Swallow Maneuvers: Seated upright 90 degrees;Upright 30-60 min after meal Oral Care Recommendations: Oral care before and after PO Patient destination: Home Follow up Recommendations: Home Health SLP;24 hour supervision/assistance Equipment Recommended: None recommended by SLP    SLP Frequency 5 out of 7 days   SLP Treatment/Interventions Cognitive remediation/compensation;Cueing hierarchy;Dysphagia/aspiration precaution training;Environmental controls;Functional tasks;Internal/external aids;Multimodal communication approach;Oral motor exercises;Patient/family education;Speech/Language facilitation    Pain Pain Assessment Pain Assessment: No/denies pain Prior Functioning Cognitive/Linguistic Baseline: Baseline deficits Type of Home: Apartment  Lives With: Alone Available Help at Discharge: Available 24 hours/day  (reports niece who previously lived with her will live with her again) Vocation: Retired  Teacher, music Term Goals: Week 1: SLP Short Term Goal 1 (Week 1): Pt will consume Dys. 1 textures and nectar thick liquids with minimal overt s/s of aspiration. SLP Short Term Goal 2 (Week 1): Pt will perform oral motor exercises with Max cues. SLP Short Term Goal 3 (Week 1): Pt will utilize speech intelligibility strategies with Max cues. SLP Short Term Goal 4 (Week 1): Pt will utilize external aids to answer orientation questions with Max cues. SLP Short Term Goal 5 (Week 1): Pt will utilize external memory aids with Max cues. SLP Short Term Goal 6 (Week 1): Pt will sustain attention to functional task for 15 min with Max cues.  See FIM for current functional status Refer to Care Plan for Long Term Goals  Recommendations for other services: None  Discharge Criteria: Patient will be discharged from SLP if patient refuses treatment 3 consecutive times without medical reason, if treatment goals not met, if there is a change in medical status, if patient makes no progress towards goals or if patient is discharged from hospital.  The above assessment, treatment plan, treatment alternatives and goals were discussed and mutually agreed upon: No family available/patient unable  Maxcine Ham 11/30/2012, 2:57 PM  Maxcine Ham, M.A. CCC-SLP [phone]

## 2012-11-30 NOTE — Progress Notes (Signed)
ANTICOAGULATION CONSULT NOTE - Follow Up Consult  Pharmacy Consult for heparin Indication: CVA w/ apical thrombus  Labs:  Recent Labs  11/28/12 0716  11/29/12 0740 11/29/12 2033 11/30/12 0540  HGB 13.2  --  15.7*  --  13.6  HCT 38.0  --  45.5  --  40.5  PLT 201  --  216  --  194  LABPROT 20.4*  --  20.9*  --  23.5*  INR 1.80*  --  1.86*  --  2.17*  HEPARINUNFRC 0.25*  < > 0.74* <0.10* <0.10*  CREATININE  --   --   --   --  0.91  < > = values in this interval not displayed.   Assessment: 67yo female now undetectable on heparin after rate decrease.  Goal of Therapy:  Heparin level 0.3-0.5 units/ml   Plan:  Will increase heparin gtt to 600 units/hr (between rates at which levels were out of range) and check level in 8hr.  Vernard Gambles, PharmD, BCPS  11/30/2012,6:48 AM

## 2012-11-30 NOTE — Evaluation (Signed)
I have reviewed and agree with the treatment as reflected in this note. Jameriah Trotti, PT DPT  

## 2012-11-30 NOTE — Progress Notes (Signed)
Physical Therapy Session Note  Patient Details  Name: [name] MRN: [id number] Date of Birth: 07-19-1945  Today's Date: 11/30/2012 Time: 4098-1191 Time Calculation (min): 27 min  Short Term Goals: Week 1:  PT Short Term Goal 1 (Week 1): Patient will perform bed<>wheelchair transfers minA.  PT Short Term Goal 2 (Week 1): Patient will negotiate 1 step LRAD, maxA.  PT Short Term Goal 3 (Week 1): Patient will propel wheelchair 25' minA.   Skilled Therapeutic Interventions/Progress Updates:  Patient received sitting in wheelchair at RN station. Patient continuously making non purposeful/unintelligible vocalizations throughout session. Session focused on functional transfers and gait training. Patient performed gait training x3' with R handrail and maxA. Patient demonstrates forward flexed posture, non-weight bearing like gait pattern (does not bear weight through L LE), hopping-like pattern on R LE, and significant posterior lean. Patient repeatedly c/o pain in L elbow at IV site. Patient returned to room and transfers wheelchair>bed with modA via stand pivot transfer, sit>supine with supervision. Bed alarm unable to be donned, so nurse tech remains at patient's bedside. RN notified of patient status and c/o pain at IV site. All needs left within reach.   Therapy Documentation Precautions:  Precautions Precautions: Fall Precaution Comments: severe dysarthria Restrictions Weight Bearing Restrictions: No Pain: Pain Assessment Pain Assessment: Faces Faces Pain Scale: Hurts even more Pain Type: Acute pain Pain Location: Arm Pain Orientation: Left Locomotion : Ambulation Ambulation/Gait Assistance: 2: Max assist   See FIM for current functional status  Therapy/Group: Individual Therapy  Chipper Herb. Angenette Daily, PT, DPT 11/30/2012, 4:28 PM

## 2012-11-30 NOTE — Progress Notes (Signed)
ANTICOAGULATION CONSULT NOTE - Follow Up Consult  Pharmacy Consult for Heparin and warfarin Indication: cardioembolic CVA with apical thrombus   Patient Measurements: Height: 5\' 1"  (154.9 cm) Weight: 80 lb (36.288 kg) IBW/kg (Calculated) : 47.8 Heparin dosing weight: 36 kg  Vital Signs: Temp: 97.9 F (36.6 C) (10/10 0449) Temp src: Oral (10/10 0449) BP: 112/63 mmHg (10/10 0929) Pulse Rate: 48 (10/10 0929)  Labs:  Recent Labs  11/28/12 0716  11/29/12 0740 11/29/12 2033 11/30/12 0540  HGB 13.2  --  15.7*  --  13.6  HCT 38.0  --  45.5  --  40.5  PLT 201  --  216  --  194  LABPROT 20.4*  --  20.9*  --  23.5*  INR 1.80*  --  1.86*  --  2.17*  HEPARINUNFRC 0.25*  < > 0.74* <0.10* <0.10*  CREATININE  --   --   --   --  0.91  < > = values in this interval not displayed.  Estimated Creatinine Clearance: 34.4 ml/min (by C-G formula based on Cr of 0.91).   Assessment: 67 yo female who presented to the ED on 10/4 with dysarthria and slurred speech and was given tPA at for confirmed infarcts on MRI. 2D echo also reports a small apical thrombus. Pt. Is on D#5 heparin bridge to therapeutic coumadin. INR increased to 2.17 today.   Goal of Therapy:  INR 2-3 Monitor platelets by anticoagulation protocol: Yes   Plan:  1. D/C heparin infusion since INR is therapeutic 2. Warfarin 4mg  PO tonight 3. Continue f/u Daily INR 4. Monitor for s/sx of bleeding  Bayard Hugger, PharmD, BCPS  Clinical Pharmacist  Pager: [phone]   11/30/2012 2:32 PM

## 2012-11-30 NOTE — Evaluation (Signed)
Physical Therapy Assessment and Plan  Patient Details  Name: [name] MRN: [id number] Date of Birth: November 06, 1945  PT Diagnosis: Abnormal posture, Abnormality of gait, Ataxia, Ataxic gait, Cognitive deficits, Coordination disorder, Difficulty walking, Hemiparesis non-dominant, Impaired cognition and Muscle weakness Rehab Potential: Fair ELOS: 2 week trial   Today's Date: 11/30/2012 Time: 4098-1191 Time Calculation (min): 60 min  Problem List:  Patient Active Problem List   Diagnosis Date Noted  . Embolic cerebral infarction 11/29/2012  . Protein-calorie malnutrition, severe 11/27/2012  . Stroke 11/24/2012  . Acute ischemic stroke 06/24/2012  . CVA (cerebral infarction) 10/11/2011  . Chest pain at rest 04/08/2011    Class: Acute  . Hypertension   . Arthralgia   . Breast cancer, stage 2 02/10/2011  . Trouble swallowing   . Breast cancer 01/28/2011  . CHF 12/25/2009  . Cerebral embolism with cerebral infarction 05/26/2009  . 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 LIVER FUNCTION TESTS, ABNORMAL, HX OF 05/14/2009  . CONSTIPATION 04/13/2009  . HYPERLIPIDEMIA 04/06/2009  . SCHIZOPHRENIA 04/06/2009  . ANXIETY 04/06/2009  . COCAINE ABUSE 04/06/2009  . HYPERTENSION, BENIGN ESSENTIAL 04/06/2009  . MYOCARDIAL INFARCTION 04/06/2009  . ASTHMA 04/06/2009  . COPD 04/06/2009  . CHOLELITHIASIS 04/06/2009  . RENAL FAILURE, ACUTE 04/06/2009  . CAD 03/31/2009    Past Medical History:  Past Medical History  Diagnosis Date  . Weight loss, unintentional   . Trouble swallowing   . Change in voice   . Substance abuse   . Dementia   . Asthma   . Shortness of breath   . Seizures     "    It has been along time "  . CHF (congestive heart failure)   . Headache(784.0)   . Hypertension   . Arthralgia   . Tremors of nervous system   . Anxiety   . Rib fractures     hx of May 2012  . Heart attack 05/21/10  . Schizophrenia   . Coronary artery disease   . Stroke 07/11/10     hx of R CVA   . COPD (chronic obstructive pulmonary disease)   . Obstructive sleep apnea   . Breast cancer 01/28/11    L breast, inv ductal/in situ, ER/PR -, Her2 -   Past Surgical History:  Past Surgical History  Procedure Laterality Date  . Total abdominal hysterectomy  age 89    Assessment & Plan Clinical Impression:[name] is a 67 y.o. 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 Admitted 11/24/2012 with slurred speech. MRI of the brain showed acute infarcts affecting the left cerebellum and right posterior temporal regions. Also noted extensive encephalomalacia related to remote bihemispheric infarction. 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 Echocardiogram with ejection fraction of 15% no wall motion abnormalities and small apical thrombus. Carotid Dopplers with no ICA stenosis. Neurology services consulted. Patient did receive TPA. Findings of elevated troponin/small non-Q-wave myocardial infarction and followup cardiology services. No significant changes in EKG. Patient placed on Coumadin therapy for cardioembolic CVA with apical thrombus. Conservative medical management per cardiology services. Admission urine drug screen was negative. Patient currently maintained on a dysphagia 1 nectar thick liquid diet. Physical therapy evaluation completed 11/27/2012 with recommendations for physical medicine rehabilitation consult  to consider inpatient rehabilitation services. Patient was felt to be acute candidate for inpatient rehabilitation services was admitted for comprehensive rehabilitation program. Patient transferred to CIR on 11/29/2012 .   Patient currently requires modA-+2TotalA with mobility  secondary to muscle weakness, decreased cardiorespiratoy endurance and decreased oxygen support, impaired timing and sequencing, unbalanced muscle activation, ataxia, decreased coordination and decreased motor planning, decreased awareness, decreased problem solving, decreased safety awareness and delayed processing and decreased sitting balance, decreased standing balance, decreased postural control, hemiplegia and decreased balance strategies.  Prior to hospitalization, patient was modI with RW in home setting and modI with hover-round in community with mobility and lived with Family (niece ) in a Apartment home.  Home access is 1Stairs to enter.  Patient will benefit from skilled PT intervention to maximize safe functional mobility, minimize fall risk and decrease caregiver burden for planned discharge home with 24 hour supervision and assist.  Anticipate patient will benefit from follow up [facility] at discharge.  PT - End of Session Activity Tolerance: Tolerates 30+ min activity with multiple rests Endurance Deficit: Yes PT Assessment Rehab Potential: Fair PT Patient demonstrates impairments in the following area(s): Balance;Endurance;Motor;Safety PT Transfers Functional Problem(s): Bed Mobility;Bed to Chair;Car;Furniture PT Locomotion Functional Problem(s): Ambulation;Wheelchair Mobility;Stairs PT Plan PT Intensity: Minimum of 1-2 x/day ,45 to 90 minutes PT Frequency: 5 out of 7 days PT Duration Estimated Length of Stay: 2 week trial PT Treatment/Interventions: Ambulation/gait training;Cognitive remediation/compensation;Community reintegration;Discharge planning;Disease management/prevention;DME/adaptive equipment instruction;Functional mobility training;Neuromuscular re-education;Pain management;Patient/family education;Psychosocial support;Splinting/orthotics;Stair training;Therapeutic Activities;Therapeutic Exercise;UE/LE Strength taining/ROM;UE/LE Coordination activities;Wheelchair  propulsion/positioning PT Recommendation Recommendations for Other Services: Speech consult Follow Up Recommendations: Home with 24 hour assistance  Patient destination: Home Equipment Recommended: None recommended by PT  Skilled Therapeutic Intervention:  Patient received seated in wheelchair. Skilled therapeutic intervention following evaluation. Session focused on wheelchair mobility, gait training, and functional transfers. See details below. Patient returned to room seated in wheelchair, seatbelt donned and all needs within reach. Nursing notified of patients return to room and safety recommendations.   PT Evaluation Precautions/Restrictions Precautions Precautions: Fall Restrictions Weight Bearing Restrictions: No General Chart Reviewed: Yes Family/Caregiver Present: No  Pain Pain Assessment Pain Assessment: No/denies pain Pain Score: 0-No pain Home Living/Prior Functioning Home Living Available Help at Discharge: Available 24 hours/day Type of Home: Apartment Home Access: Stairs to enter Entrance Stairs-Number of Steps: 1 Home Layout: One level (1st floor apartment) Additional Comments: all home environment information obtained from chart, per acute note and H&P, patient modI with RW at home, ADLs modI, hover-round in community.   Lives With: Family (niece ) Prior Function Level of Independence: Requires assistive device for independence;Independent with basic ADLs;Needs assistance with homemaking  Able to Take Stairs?: Yes Vocation: Retired Comments: ModI with RW in home, hover-round for community.  Vision/Perception  Vision - History Baseline Vision: Wears glasses only for reading Patient Visual Report: No change from baseline Vision - Assessment Eye Alignment: Within Functional Limits Additional Comments: difficult to formally assess secondary to cognitive linguistic deficits Perception Perception: Within Functional Limits Praxis Praxis: Intact   Cognition Overall Cognitive Status: No family/caregiver present to determine baseline cognitive functioning Arousal/Alertness: Awake/alert Orientation Level: Oriented to person;Oriented to place Attention: Focused;Sustained Focused Attention: Impaired Focused Attention Impairment: Verbal basic;Functional basic Sustained Attention: Impaired Memory: Impaired Memory Impairment: Decreased recall of new information Awareness: Impaired Awareness Impairment: Intellectual impairment;Emergent impairment;Anticipatory impairment Problem Solving: Impaired Sequencing: Impaired Self Monitoring: Impaired Behaviors: Restless;Impulsive;Verbal agitation;Confabulation Safety/Judgment: Impaired Sensation Sensation Light Touch: Appears Intact Hot/Cold: Appears Intact Proprioception: Appears Intact  Coordination Gross Motor Movements are Fluid and Coordinated: No Fine Motor Movements are Fluid and Coordinated: No Coordination and Movement Description: Rapid alternating movements intact, decreased velocity and coordination L UE.  Finger Nose Finger Test: decreased speed and accuracy with LUE; ataxia Heel Shin Test: no noted dysmetria R LE, dysmetria noted L LE (strength?, ataxia?) Motor  Motor Motor: Ataxia;Hemiplegia;Abnormal postural alignment and control Motor - Skilled Clinical Observations: L hemiparesis  Mobility Bed Mobility Bed Mobility: Supine to Sit;Sit to Supine Supine to Sit: 5: Supervision Supine to Sit Details: Verbal cues for sequencing;Verbal cues for technique;Verbal cues for precautions/safety;Tactile cues for placement Supine to Sit Details (indicate cue type and reason): supine>sit on mat table, verbal cues for instruction.  Sit to Supine: 5: Supervision Sit to Supine - Details: Tactile cues for placement;Verbal cues for technique;Verbal cues for sequencing;Verbal cues for precautions/safety Sit to Supine - Details (indicate cue type and reason): Sit to supine on mat table,  unable to lie completely flat, seondary to c/o back pain. Verbal cues for instruction.  Transfers Transfers: Yes Sit to Stand: 4: Min assist Sit to Stand Details: Tactile cues for placement;Verbal cues for technique;Verbal cues for precautions/safety;Tactile cues for weight shifting Sit to Stand Details (indicate cue type and reason): Patient sit>stand required verbal/tactile cuing for hand placement on armrests.  Stand to Sit: 4: Min assist Stand to Sit Details (indicate cue type and reason): Tactile cues for placement;Verbal cues for technique;Verbal cues for precautions/safety Stand to Sit Details: Stand>sit minA, verbal/tactile cues and manual facilitation for hand placement on armrests.  Stand Pivot Transfers: 3: Mod assist Stand Pivot Transfer Details: Tactile cues for sequencing;Tactile cues for weight shifting;Tactile cues for posture;Tactile cues for placement;Tactile cues for weight beaing;Verbal cues for sequencing;Verbal cues for technique;Verbal cues for precautions/safety Stand Pivot Transfer Details (indicate cue type and reason): Patient stand pivot transfer wheelchair<>mat table modA, verbal/tactile cuing for hand placement, taking small steps to turn, and manual facilitation for L weight shift at hips and for increased weight bearing on L LE.  Locomotion  Ambulation Ambulation: Yes Ambulation/Gait Assistance: +2 totalA Ambulation Distance (Feet): 6 Feet Assistive device: Rolling walker Ambulation/Gait Assistance Details: Tactile cues for posture;Tactile cues for placement;Tactile cues for weight beaing;Verbal cues for sequencing;Verbal cues for technique;Verbal cues for precautions/safety;Verbal cues for gait pattern;Verbal cues for safe use of DME/AE Ambulation/Gait Assistance Details: Patient instructed in gait training 6'x1 RW +2TotalA, patient demonstrated gait pattern consistent with having a NWB L LE, patient advanced RW while hopping on R foot, no weight bearing through L  LE.  Gait Gait: Yes Gait Pattern: Decreased step length - left;Decreased stance time - left;Decreased dorsiflexion - left;Decreased weight shift to left;Trunk flexed;Narrow base of support Gait velocity: Patient demonstrated decreased gait speed.  Stairs / Additional Locomotion Stairs: Yes Stairs Assistance: 1: +2 Total assist Stairs Assistance Details: Verbal cues for sequencing;Visual cues/gestures for sequencing;Verbal cues for technique;Verbal cues for precautions/safety;Verbal cues for gait pattern Stairs Assistance Details (indicate cue type and reason): Patient negotiated 1 curb step +2 totalA HHA, verbal/tactile and manual facilitation for upright posture, patient demonstrated posterior and R trunk lean when stepping up with R LE, does not bear weight through L LE. Patient lowered to w/c from curb step, unable to step down.  Stair Management Technique: No rails (B HHA) Number of Stairs: 1 Height of Stairs: 5 Curb: 1: +2 Total assist Wheelchair Mobility Wheelchair Mobility: Yes Wheelchair Assistance: 3: Mod assist Wheelchair Propulsion: Both upper extremities Distance: 10'  Trunk/Postural Assessment  Cervical  Assessment Cervical Assessment: Within Functional Limits Thoracic Assessment Thoracic Assessment: Within Functional Limits Lumbar Assessment Lumbar Assessment: Within Functional Limits Postural Control Postural Control: Deficits on evaluation Postural Limitations: flexed trunk posture.   Balance Balance Balance Assessed: Yes Static Sitting Balance Static Sitting - Balance Support: Feet supported;Bilateral upper extremity supported Static Sitting - Level of Assistance: 5: Stand by assistance Static Standing Balance Static Standing - Balance Support: During functional activity;Bilateral upper extremity supported Static Standing - Level of Assistance: 3: Mod assist Static Standing - Comment/# of Minutes: 10 seconds Dynamic Standing Balance Dynamic Standing - Balance  Support: Bilateral upper extremity supported;During functional activity Dynamic Standing - Level of Assistance: 3: Mod assist Extremity Assessment  RLE Assessment RLE Assessment: Exceptions to [facility] RLE Strength RLE Overall Strength: Deficits;Due to premorbid status RLE Overall Strength Comments: 4-/5 knee ext, flex, ankle DF, PF; hip flex 3+/5 LLE Assessment LLE Assessment: Exceptions to [facility] LLE Strength LLE Overall Strength: Deficits;Due to premorbid status LLE Overall Strength Comments: hip flex 3+/5, knee ext/flex 3/5, no noted movement at the ankle (PF or DF)  See FIM for Functional Mobility:   Refer to Care Plan for Long Term Goals  Recommendations for other services: Other: speech therapy  Discharge Criteria: Patient will be discharged from PT if patient refuses treatment 3 consecutive times without medical reason, if treatment goals not met, if there is a change in medical status, if patient makes no progress towards goals or if patient is discharged from hospital.  The above assessment, treatment plan, treatment alternatives and goals were discussed and mutually agreed upon: No family available/patient unable  Doralee Albino 11/30/2012, 12:46 PM

## 2012-11-30 NOTE — Progress Notes (Signed)
Patient arrived from 4N with RN at bedside. Reviewed packet and safety plan with patient verbally understanding upon admission. Patient in bed resting with side rails x3, call bell within reach.

## 2012-11-30 NOTE — Progress Notes (Signed)
Subjective/Complaints: 67 y.o. 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 Admitted 11/24/2012 with slurred speech. MRI of the brain showed acute infarcts affecting the left cerebellum and right posterior temporal regions. Also noted extensive encephalomalacia related to remote bihemispheric infarction. 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 Echocardiogram with ejection fraction of 15% no wall motion abnormalities and small apical thrombus. Carotid Dopplers with no ICA stenosis. Neurology services consulted. Patient did receive TPA. Findings of elevated troponin/small non-Q-wave myocardial infarction and followup cardiology services  Review of Systems - Negative except dysarthria, severe  Objective: Vital Signs: Blood pressure 110/77, pulse 59, temperature 97.9 F (36.6 C), temperature source Oral, resp. rate 17, height 5\' 1"  (1.549 m), weight 36.288 kg (80 lb), SpO2 96.00%. No results found. Results for orders placed during the hospital encounter of 11/29/12 (from the past 72 hour(s))  HEPARIN LEVEL (UNFRACTIONATED)     Status: Abnormal   Collection Time    11/29/12  8:33 PM      Result Value Range   Heparin Unfractionated <0.10 (*) 0.30 - 0.70 IU/mL   Comment:            IF HEPARIN RESULTS ARE BELOW     EXPECTED VALUES, AND PATIENT     DOSAGE HAS BEEN CONFIRMED,     SUGGEST FOLLOW UP TESTING     OF ANTITHROMBIN III LEVELS.  GLUCOSE, CAPILLARY     Status: Abnormal   Collection Time    11/29/12  8:59 PM      Result Value Range   Glucose-Capillary 154 (*) 70 - 99 mg/dL  CBC WITH DIFFERENTIAL     Status: None   Collection Time    11/30/12  5:40 AM      Result Value Range   WBC 4.3  4.0 - 10.5 K/uL    RBC 4.45  3.87 - 5.11 MIL/uL   Hemoglobin 13.6  12.0 - 15.0 g/dL   HCT 16.1  09.6 - 04.5 %   MCV 91.0  78.0 - 100.0 fL   MCH 30.6  26.0 - 34.0 pg   MCHC 33.6  30.0 - 36.0 g/dL   RDW 40.9  81.1 - 91.4 %   Platelets 194  150 - 400 K/uL   Neutrophils Relative % 63  43 - 77 %   Neutro Abs 2.7  1.7 - 7.7 K/uL   Lymphocytes Relative 24  12 - 46 %   Lymphs Abs 1.0  0.7 - 4.0 K/uL   Monocytes Relative 11  3 - 12 %   Monocytes Absolute 0.5  0.1 - 1.0 K/uL   Eosinophils Relative 2  0 - 5 %   Eosinophils Absolute 0.1  0.0 - 0.7 K/uL   Basophils Relative 1  0 - 1 %   Basophils Absolute 0.0  0.0 - 0.1 K/uL  COMPREHENSIVE METABOLIC PANEL     Status: Abnormal   Collection Time    11/30/12  5:40 AM      Result Value Range   Sodium 139  135 - 145 mEq/L   Potassium 4.6  3.5 - 5.1 mEq/L   Chloride 103  96 - 112 mEq/L   CO2 25  19 - 32 mEq/L   Glucose, Bld 77  70 - 99 mg/dL  BUN 12  6 - 23 mg/dL   Creatinine, Ser 1.61  0.50 - 1.10 mg/dL   Calcium 9.2  8.4 - 09.6 mg/dL   Total Protein 6.8  6.0 - 8.3 g/dL   Albumin 3.3 (*) 3.5 - 5.2 g/dL   AST 27  0 - 37 U/L   ALT 11  0 - 35 U/L   Alkaline Phosphatase 79  39 - 117 U/L   Total Bilirubin 0.1 (*) 0.3 - 1.2 mg/dL   GFR calc non Af Amer 64 (*) >90 mL/min   GFR calc Af Amer 74 (*) >90 mL/min   Comment: (NOTE)     The eGFR has been calculated using the CKD EPI equation.     This calculation has not been validated in all clinical situations.     eGFR's persistently <90 mL/min signify possible Chronic Kidney     Disease.  PROTIME-INR     Status: Abnormal   Collection Time    11/30/12  5:40 AM      Result Value Range   Prothrombin Time 23.5 (*) 11.6 - 15.2 seconds   INR 2.17 (*) 0.00 - 1.49  HEPARIN LEVEL (UNFRACTIONATED)     Status: Abnormal   Collection Time    11/30/12  5:40 AM      Result Value Range   Heparin Unfractionated <0.10 (*) 0.30 - 0.70 IU/mL   Comment:            IF HEPARIN RESULTS ARE BELOW     EXPECTED VALUES, AND PATIENT      DOSAGE HAS BEEN CONFIRMED,     SUGGEST FOLLOW UP TESTING     OF ANTITHROMBIN III LEVELS.     HEENT: normal Cardio: RRR and no murmurs Resp: CTA B/L GI: BS positive and non distended Extremity:  Pulses positive Skin:   Intact Neuro: Confused, Cranial Nerve II-XII normal, Abnormal Sensory unable to assess due to dysarthria-severe, Abnormal Motor 4/5 in BUE and BLE and Dysarthric, ataxia L FNF Musc/Skel:  Normal Gen NAD   Assessment/Plan: 1. Functional deficits secondary to Left cerebellar infarct which require 3+ hours per day of interdisciplinary therapy in a comprehensive inpatient rehab setting. Physiatrist is providing close team supervision and 24 hour management of active medical problems listed below. Physiatrist and rehab team continue to assess barriers to discharge/monitor patient progress toward functional and medical goals. FIM:                   Comprehension Comprehension Mode: Auditory Comprehension: 3-Understands basic 50 - 74% of the time/requires cueing 25 - 50%  of the time  Expression Expression Mode: Verbal Expression: 3-Expresses basic 50 - 74% of the time/requires cueing 25 - 50% of the time. Needs to repeat parts of sentences.  Social Interaction Social Interaction: 4-Interacts appropriately 75 - 89% of the time - Needs redirection for appropriate language or to initiate interaction.  Problem Solving Problem Solving: 2-Solves basic 25 - 49% of the time - needs direction more than half the time to initiate, plan or complete simple activities  Memory Memory: 1-Recognizes or recalls less than 25% of the time/requires cueing greater than 75% of the time   Medical Problem List and Plan:  1. 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 DVT Prophylaxis/Anticoagulation: Coumadin therapy. Monitor for any bleeding episodes. Continue heparin until INR between 2.00 and 3.0  3. Pain Management: Tylenol as needed  4. Dysphagia.  Dysphagia 1 nectar liquids. Followup speech therapy. Monitor for  any signs of aspiration  5. Neuropsych: This patient is not capable of making decisions on her own behalf.  6. Small non-Q wave myocardial infarction/severe nonischemic cardiomyopathy/apical thrombus. Continue conservative care and maintain Coumadin therapy. Followup cardiology services  7. Hypertension. Altace 2.5 mg daily, Coreg 3.125 mg twice a day. Monitor with increased mobility  8. History of polysubstance abuse. Urine drug screen negative.     LOS (Days) 1 A FACE TO FACE EVALUATION WAS PERFORMED  KIRSTEINS,ANDREW E 11/30/2012, 8:15 AM

## 2012-11-30 NOTE — Progress Notes (Signed)
Patient information reviewed and entered into eRehab system by Jeaninne Lodico, RN, CRRN, PPS Coordinator.  Information including medical coding and functional independence measure will be reviewed and updated through discharge.     Per nursing patient was given "Data Collection Information Summary for Patients in Inpatient Rehabilitation Facilities with attached "Privacy Act Statement-Health Care Records" upon admission.  

## 2012-11-30 NOTE — Progress Notes (Signed)
Patient agitated/restless throughout night. Patient keeps yelling non-stop. Xanax given for anxiety. Side rails X 4 with pt continually attempting to get out of bed. Pt come to nursing desk multiple times to attempt to calm her down with constant yelling and agitation to staff.  Pt slept a total of three hours throughout night.  Pt pulled out IV site with no understanding of doing so. She shows confusion at all times.  Pt very unsafe even with SRX4.  She needs constant supervision.  Pt's agitation impended other pt's sleeping cycle throughout night. Continue to monitor pt. Will notify upcoming RN of episodes of the night.

## 2012-12-01 ENCOUNTER — Inpatient Hospital Stay (HOSPITAL_COMMUNITY): Payer: Medicare Other

## 2012-12-01 ENCOUNTER — Inpatient Hospital Stay (HOSPITAL_COMMUNITY): Payer: Medicare Other | Admitting: Speech Pathology

## 2012-12-01 DIAGNOSIS — I6350 Cerebral infarction due to unspecified occlusion or stenosis of unspecified cerebral artery: Secondary | ICD-10-CM

## 2012-12-01 DIAGNOSIS — I10 Essential (primary) hypertension: Secondary | ICD-10-CM

## 2012-12-01 DIAGNOSIS — I6340 Cerebral infarction due to embolism of unspecified cerebral artery: Secondary | ICD-10-CM

## 2012-12-01 LAB — PROTIME-INR
INR: 2.29 — ABNORMAL HIGH (ref 0.00–1.49)
Prothrombin Time: 24.5 seconds — ABNORMAL HIGH (ref 11.6–15.2)

## 2012-12-01 MED ORDER — WARFARIN SODIUM 4 MG PO TABS
4.0000 mg | ORAL_TABLET | Freq: Once | ORAL | Status: AC
  Administered 2012-12-01: 4 mg via ORAL
  Filled 2012-12-01: qty 1

## 2012-12-01 NOTE — Progress Notes (Signed)
ANTICOAGULATION CONSULT NOTE - Follow Up Consult  Pharmacy Consult for warfarin Indication: cardioembolic CVA with apical thrombus   Patient Measurements: Height: 5\' 1"  (154.9 cm) Weight: 80 lb (36.288 kg) IBW/kg (Calculated) : 47.8 Heparin dosing weight: 36 kg  Vital Signs: Temp: 97.3 F (36.3 C) (10/11 0557) Temp src: Axillary (10/11 0557) BP: 104/66 mmHg (10/11 0557) Pulse Rate: 66 (10/11 0557)  Labs:  Recent Labs  11/29/12 0740 11/29/12 2033 11/30/12 0540 12/01/12 0738  HGB 15.7*  --  13.6  --   HCT 45.5  --  40.5  --   PLT 216  --  194  --   LABPROT 20.9*  --  23.5* 24.5*  INR 1.86*  --  2.17* 2.29*  HEPARINUNFRC 0.74* <0.10* <0.10*  --   CREATININE  --   --  0.91  --     Estimated Creatinine Clearance: 34.4 ml/min (by C-G formula based on Cr of 0.91).   Assessment: 67 yo female who presented to the ED on 10/4 with dysarthria and slurred speech and was given tPA at for confirmed infarcts on MRI. 2D echo also reports a small apical thrombus. Pt continued on coumadin with a therapeutic INR. No bleeding noted, no new CBC today.   Goal of Therapy:  INR 2-3   Plan:  1. Warfarin 4mg  PO x 1 tonight 2. F/u AM INR  Lysle Pearl, PharmD, BCPS Pager # [phone] 12/01/2012 8:46 AM

## 2012-12-01 NOTE — Progress Notes (Signed)
Speech Language Pathology Daily Session Note  Patient Details  Name: [name] MRN: [id number] Date of Birth: 1945-12-20  Today's Date: 12/01/2012 Time: 1308-6578 Time Calculation (min): 40 min  Short Term Goals: Week 1: SLP Short Term Goal 1 (Week 1): Pt will consume Dys. 1 textures and nectar thick liquids with minimal overt s/s of aspiration. SLP Short Term Goal 2 (Week 1): Pt will perform oral motor exercises with Max cues. SLP Short Term Goal 3 (Week 1): Pt will utilize speech intelligibility strategies with Max cues. SLP Short Term Goal 4 (Week 1): Pt will utilize external aids to answer orientation questions with Max cues. SLP Short Term Goal 5 (Week 1): Pt will utilize external memory aids with Max cues. SLP Short Term Goal 6 (Week 1): Pt will sustain attention to functional task for 15 min with Max cues.  Skilled Therapeutic Interventions: Skilled treatment focused on addressing swallowing and cognitive goals. SLP facilitated session with skilled observation of Dys.1 textures and nectar-thick liquids.  Patient consumed snack with Mod verbal cues to refrain form talking and utilize safe swallow strategies.  Patient exhibited no overt s/s of aspiration.  Patient frequently yelled out "nurse," while SLP was seated in front of her; patient required Max cues throughout session to attend to task for ~30 seconds.  Continue with current plan of care.    FIM:  Comprehension Comprehension Mode: Auditory Comprehension: 3-Understands basic 50 - 74% of the time/requires cueing 25 - 50%  of the time Expression Expression: 3-Expresses basic 50 - 74% of the time/requires cueing 25 - 50% of the time. Needs to repeat parts of sentences. Social Interaction Social Interaction: 4-Interacts appropriately 75 - 89% of the time - Needs redirection for appropriate language or to initiate interaction. Problem Solving Problem Solving: 2-Solves basic 25 - 49% of the time - needs direction more than  half the time to initiate, plan or complete simple activities Memory Memory: 1-Recognizes or recalls less than 25% of the time/requires cueing greater than 75% of the time FIM - Eating Eating Activity: 5: Set-up assist for open containers;5: Needs verbal cues/supervision  Pain Pain Assessment Pain Assessment: No/denies pain  Therapy/Group: Individual Therapy  Charlane Ferretti., CCC-SLP [phone]  Dilyn Osoria 12/01/2012, 1:02 PM

## 2012-12-01 NOTE — Progress Notes (Signed)
Physical Therapy Session Note  Patient Details  Name: ANNASTACIA DUBA MRN: [id number] Date of Birth: 04/10/1945  Today's Date: 12/01/2012 Time: 1330-1400 Time Calculation (min): 30 min  Short Term Goals: Week 1:  PT Short Term Goal 1 (Week 1): Patient will perform bed<>wheelchair transfers minA.  PT Short Term Goal 2 (Week 1): Patient will negotiate 1 step LRAD, maxA.  PT Short Term Goal 3 (Week 1): Patient will propel wheelchair 25' minA.   Skilled Therapeutic Interventions/Progress Updates:    Session focused on functional bed mobility, transfers, standing tolerance/balance, and overall activity tolerance. Pt able to don socks with encouragement prior to OOB. Mod A stand pivot transfer into w/c due to pt not bearing weight through LLE. Short distance w/c propulsion with min A to navigate out of room. Standing activity to promote equal weightbearing while completing fine motor task with peg board (overall close S/steady A for balance and for sit to stand) and required 1 seated rest break. Returned to bed end of session with light mod A for stand pivot transfer with cues to WB through LLE.  Therapy Documentation Precautions:  Precautions Precautions: Fall Precaution Comments: severe dysarthria Restrictions Weight Bearing Restrictions: No   Pain: Pain Assessment Pain Assessment: No/denies pain  See FIM for current functional status  Therapy/Group: Individual Therapy  Karolee Stamps [facility] 12/01/2012, 2:45 PM

## 2012-12-01 NOTE — Progress Notes (Signed)
Patient ID: [name], female   DOB: 10/21/1945, 67 y.o.   MRN: [id number]  10/11. Subjective/Complaints: 67 y.o. 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 Admitted 11/24/2012 with slurred speech. MRI of the brain showed acute infarcts affecting the left cerebellum and right posterior temporal regions. Also noted extensive encephalomalacia related to remote bihemispheric infarction. 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 Echocardiogram with ejection fraction of 15% no wall motion abnormalities and small apical thrombus. Carotid Dopplers with no ICA stenosis. Neurology services consulted. Patient did receive TPA. Findings of elevated troponin/small non-Q-wave myocardial infarction and followup cardiology services  Objective: Vital Signs: Blood pressure 104/66, pulse 66, temperature 97.3 F (36.3 C), temperature source Axillary, resp. rate 16, height 5\' 1"  (1.549 m), weight 36.288 kg (80 lb), SpO2 100.00%. No results found. Results for orders placed during the hospital encounter of 11/29/12 (from the past 72 hour(s))  HEPARIN LEVEL (UNFRACTIONATED)     Status: Abnormal   Collection Time    11/29/12  8:33 PM      Result Value Range   Heparin Unfractionated <0.10 (*) 0.30 - 0.70 IU/mL   Comment:            IF HEPARIN RESULTS ARE BELOW     EXPECTED VALUES, AND PATIENT     DOSAGE HAS BEEN CONFIRMED,     SUGGEST FOLLOW UP TESTING     OF ANTITHROMBIN III LEVELS.  GLUCOSE, CAPILLARY     Status: Abnormal   Collection Time    11/29/12  8:59 PM      Result Value Range   Glucose-Capillary 154 (*) 70 - 99 mg/dL  CBC WITH DIFFERENTIAL     Status: None   Collection Time    11/30/12  5:40 AM      Result  Value Range   WBC 4.3  4.0 - 10.5 K/uL   RBC 4.45  3.87 - 5.11 MIL/uL   Hemoglobin 13.6  12.0 - 15.0 g/dL   HCT 65.7  84.6 - 96.2 %   MCV 91.0  78.0 - 100.0 fL   MCH 30.6  26.0 - 34.0 pg   MCHC 33.6  30.0 - 36.0 g/dL   RDW 95.2  84.1 - 32.4 %   Platelets 194  150 - 400 K/uL   Neutrophils Relative % 63  43 - 77 %   Neutro Abs 2.7  1.7 - 7.7 K/uL   Lymphocytes Relative 24  12 - 46 %   Lymphs Abs 1.0  0.7 - 4.0 K/uL   Monocytes Relative 11  3 - 12 %   Monocytes Absolute 0.5  0.1 - 1.0 K/uL   Eosinophils Relative 2  0 - 5 %   Eosinophils Absolute 0.1  0.0 - 0.7 K/uL   Basophils Relative 1  0 - 1 %   Basophils Absolute 0.0  0.0 - 0.1 K/uL  COMPREHENSIVE METABOLIC PANEL     Status: Abnormal   Collection Time    11/30/12  5:40 AM      Result Value Range   Sodium 139  135 - 145 mEq/L   Potassium 4.6  3.5 - 5.1 mEq/L   Chloride 103  96 - 112 mEq/L   CO2 25  19 - 32 mEq/L  Glucose, Bld 77  70 - 99 mg/dL   BUN 12  6 - 23 mg/dL   Creatinine, Ser 1.61  0.50 - 1.10 mg/dL   Calcium 9.2  8.4 - 09.6 mg/dL   Total Protein 6.8  6.0 - 8.3 g/dL   Albumin 3.3 (*) 3.5 - 5.2 g/dL   AST 27  0 - 37 U/L   ALT 11  0 - 35 U/L   Alkaline Phosphatase 79  39 - 117 U/L   Total Bilirubin 0.1 (*) 0.3 - 1.2 mg/dL   GFR calc non Af Amer 64 (*) >90 mL/min   GFR calc Af Amer 74 (*) >90 mL/min   Comment: (NOTE)     The eGFR has been calculated using the CKD EPI equation.     This calculation has not been validated in all clinical situations.     eGFR's persistently <90 mL/min signify possible Chronic Kidney     Disease.  PROTIME-INR     Status: Abnormal   Collection Time    11/30/12  5:40 AM      Result Value Range   Prothrombin Time 23.5 (*) 11.6 - 15.2 seconds   INR 2.17 (*) 0.00 - 1.49  HEPARIN LEVEL (UNFRACTIONATED)     Status: Abnormal   Collection Time    11/30/12  5:40 AM      Result Value Range   Heparin Unfractionated <0.10 (*) 0.30 - 0.70 IU/mL   Comment:            IF HEPARIN RESULTS ARE  BELOW     EXPECTED VALUES, AND PATIENT     DOSAGE HAS BEEN CONFIRMED,     SUGGEST FOLLOW UP TESTING     OF ANTITHROMBIN III LEVELS.  PROTIME-INR     Status: Abnormal   Collection Time    12/01/12  7:38 AM      Result Value Range   Prothrombin Time 24.5 (*) 11.6 - 15.2 seconds   INR 2.29 (*) 0.00 - 1.49    Patient Vitals for the past 24 hrs:  BP Temp Temp src Pulse Resp SpO2  12/01/12 0557 104/66 mmHg 97.3 F (36.3 C) Axillary 66 16 100 %  11/30/12 1504 115/80 mmHg 97.3 F (36.3 C) Oral 53 18 100 %    Intake/Output Summary (Last 24 hours) at 12/01/12 1010 Last data filed at 12/01/12 0900  Gross per 24 hour  Intake    840 ml  Output      0 ml  Net    840 ml    General- frail, NAD HEENT: normal  N/C O2 Cardio: RRR and no murmurs Resp: CTA B/L GI: BS positive and non distended Extremity:  Pulses positive Skin:   Intact Neuro: Confused, Cranial Nerve II-XII normal, Abnormal Sensory unable to assess due to dysarthria-severe, Abnormal Motor 4/5 in BUE and BLE and Dysarthric, ataxia L FNF   Assessment/Plan: 1. Functional deficits secondary to Left cerebellar infarct which require 3+ hours per day of interdisciplinary therapy in a comprehensive inpatient rehab setting.  2. DVT Prophylaxis/Anticoagulation: Coumadin therapy. INR therapeutic  3. Dysphagia. Dysphagia 1 nectar liquids. Followup speech therapy. Monitor for any signs of aspiration   4. Small non-Q wave myocardial infarction/severe nonischemic cardiomyopathy/apical thrombus.     Continue conservative care and maintain Coumadin therapy. Followup cardiology services  5. Hypertension. Altace 2.5 mg daily, Coreg 3.125 mg twice a day. Monitor with increased mobility  6.  History of polysubstance abuse. Urine drug screen negative.  LOS (Days) 2 A FACE TO FACE EVALUATION WAS PERFORMED  Rogelia Boga 12/01/2012, 10:08 AM

## 2012-12-01 NOTE — Plan of Care (Signed)
Problem: RH SAFETY Goal: RH STG ADHERE TO SAFETY PRECAUTIONS W/ASSISTANCE/DEVICE STG Adhere to Safety Precautions With Assistance/Device.  Outcome: Not Progressing Getting out of bed.

## 2012-12-01 NOTE — Progress Notes (Signed)
Complained of nausea, PRN zofran given at 2251. 2334, wheezing and SOB, O2 at 3L/m via [place], PRN xopenex given at 2334. At nurse's station yelling, PRN xanax not due. Paged Dr. Amador Cunas at 2335, new orders received. Xanax 0.25mg  given at 2338, med effective. Alfredo Martinez A

## 2012-12-02 ENCOUNTER — Inpatient Hospital Stay (HOSPITAL_COMMUNITY): Payer: Medicare Other | Admitting: Physical Therapy

## 2012-12-02 ENCOUNTER — Inpatient Hospital Stay (HOSPITAL_COMMUNITY): Payer: Medicare Other | Admitting: *Deleted

## 2012-12-02 DIAGNOSIS — I509 Heart failure, unspecified: Secondary | ICD-10-CM

## 2012-12-02 LAB — PROTIME-INR
INR: 2.82 — ABNORMAL HIGH (ref 0.00–1.49)
Prothrombin Time: 28.7 seconds — ABNORMAL HIGH (ref 11.6–15.2)

## 2012-12-02 MED ORDER — WARFARIN SODIUM 2 MG PO TABS
2.0000 mg | ORAL_TABLET | Freq: Once | ORAL | Status: AC
  Administered 2012-12-02: 2 mg via ORAL
  Filled 2012-12-02: qty 1

## 2012-12-02 NOTE — Progress Notes (Signed)
Occupational Therapy Note Patient Details  Name: SHERIAL EBRAHIM MRN: [id number] Date of Birth: 1945/05/19 Today's Date: 12/02/2012  Time:0900-1000  (60 min) Pain:  none Individual session    Humberto Seals 12/02/2012, 10:10 AM   Engaged in bathing and dressing at shower level.  Pt. Transferred to toilet with moderate assist.  .  Went from wc to shower bench with mod assist.  Pt. Bathed self and did standing for periare with moderate assist for balance.  Ppt need one hand to hold to bar while washing.    Pt. Able to bring feet up to bench and wash  (very flexible).  Pt. Dressed shirt and pants with assist pulling pants up.  Placed in wc at nursing station with safety belt on.

## 2012-12-02 NOTE — Progress Notes (Signed)
Physical Therapy Session Note  Patient Details  Name: [name] MRN: [id number] Date of Birth: [birth year]  Today's Date: 12/02/2012 Time: 4098-1191 Time Calculation (min): 32 min  Short Term Goals: Week 1:  PT Short Term Goal 1 (Week 1): Patient will perform bed<>wheelchair transfers minA.  PT Short Term Goal 2 (Week 1): Patient will negotiate 1 step LRAD, maxA.  PT Short Term Goal 3 (Week 1): Patient will propel wheelchair 25' minA.   Skilled Therapeutic Interventions/Progress Updates:  Pt was seen bedside in the pm. Pt transferred long sitting to edge of bed with S. Pt transferred edge of bed to w/c with mod A and verbal cues. Pt transferred w/c to commode, commode to w/c with grab bar and min-mod A. Pt transferred w/c <> mat x2 with mod and verbal cues. Pt transferred edge of mat < > supine with S to min A. Pt transferred w/c to edge of bed with mod A. Pt transferred edge of bed to supine with S. Despite multiple verbal and tactile cues pt continues to transfer with decreased WB on L LE.   Therapy Documentation Precautions:  Precautions Precautions: Fall Precaution Comments: severe dysarthria Restrictions Weight Bearing Restrictions: No General: Amount of Missed PT Time (min): 13 Minutes Missed Time Reason: Patient fatigue Vital Signs:   Pain: Pain Assessment Pain Score: 0-No pain  :  See FIM for current functional status  Therapy/Group: Individual Therapy  Rayford Halsted 12/02/2012, 2:46 PM

## 2012-12-02 NOTE — Progress Notes (Signed)
Physical Therapy Session Note  Patient Details  Name: KRISANN MCKENNA MRN: [id number] Date of Birth: 07/16/1945  Today's Date: 12/02/2012 Time: 0800-0858 Time Calculation (min): 58 min  Short Term Goals: Week 1:  PT Short Term Goal 1 (Week 1): Patient will perform bed<>wheelchair transfers minA.  PT Short Term Goal 2 (Week 1): Patient will negotiate 1 step LRAD, maxA.  PT Short Term Goal 3 (Week 1): Patient will propel wheelchair 25' minA.   Skilled Therapeutic Interventions/Progress Updates:  Pt was seen bedside in the am. Pt transported to rehab gym. Pt required constant redirection to remain focused on task at hand and max encouragement to participate. Treatment focused on NMR and LE strengthening in and out of the parallel bars. Pt able to stand with min to mod A with verbal cues. While standing treatment focused on unilateral stance and weight to assist with transfers and stairs. Pt performed stair taps with R LE requiring max assist to weight onto L LE to preform.    Therapy Documentation Precautions:  Precautions Precautions: Fall Precaution Comments: severe dysarthria Restrictions Weight Bearing Restrictions: No General:   Pain: Pain Assessment Pain Assessment: No/denies pain  See FIM for current functional status  Therapy/Group: Individual Therapy  Rayford Halsted 12/02/2012, 12:27 PM

## 2012-12-02 NOTE — Progress Notes (Signed)
Patient ID: [name], female   DOB: May 30, 1945, 67 y.o.   MRN: [id number]  Patient ID: [name], female   DOB: [birth year], 67 y.o.   MRN: [id number]   Subjective/Complaints: 10/12. 67 y.o. 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 Admitted 11/24/2012 with slurred speech. MRI of the brain showed acute infarcts affecting the left cerebellum and right posterior temporal regions. Also noted extensive encephalomalacia related to remote bihemispheric infarction. 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 Echocardiogram with ejection fraction of 15% no wall motion abnormalities and small apical thrombus. Carotid Dopplers with no ICA stenosis. Neurology services consulted. Patient did receive TPA. Findings of elevated troponin/small non-Q-wave myocardial infarction and followup cardiology services  Objective: Vital Signs: Blood pressure 100/68, pulse 57, temperature 97.5 F (36.4 C), temperature source Axillary, resp. rate 18, height 5\' 1"  (1.549 m), weight 36.288 kg (80 lb), SpO2 100.00%. No results found. Results for orders placed during the hospital encounter of 11/29/12 (from the past 72 hour(s))  HEPARIN LEVEL (UNFRACTIONATED)     Status: Abnormal   Collection Time    11/29/12  8:33 PM      Result Value Range   Heparin Unfractionated <0.10 (*) 0.30 - 0.70 IU/mL   Comment:            IF HEPARIN RESULTS ARE BELOW     EXPECTED VALUES, AND PATIENT     DOSAGE HAS BEEN CONFIRMED,     SUGGEST FOLLOW UP TESTING     OF ANTITHROMBIN III LEVELS.  GLUCOSE, CAPILLARY     Status: Abnormal   Collection Time    11/29/12  8:59 PM      Result Value Range   Glucose-Capillary 154 (*) 70 - 99 mg/dL  CBC WITH  DIFFERENTIAL     Status: None   Collection Time    11/30/12  5:40 AM      Result Value Range   WBC 4.3  4.0 - 10.5 K/uL   RBC 4.45  3.87 - 5.11 MIL/uL   Hemoglobin 13.6  12.0 - 15.0 g/dL   HCT 69.6  29.5 - 28.4 %   MCV 91.0  78.0 - 100.0 fL   MCH 30.6  26.0 - 34.0 pg   MCHC 33.6  30.0 - 36.0 g/dL   RDW 13.2  44.0 - 10.2 %   Platelets 194  150 - 400 K/uL   Neutrophils Relative % 63  43 - 77 %   Neutro Abs 2.7  1.7 - 7.7 K/uL   Lymphocytes Relative 24  12 - 46 %   Lymphs Abs 1.0  0.7 - 4.0 K/uL   Monocytes Relative 11  3 - 12 %   Monocytes Absolute 0.5  0.1 - 1.0 K/uL   Eosinophils Relative 2  0 - 5 %   Eosinophils Absolute 0.1  0.0 - 0.7 K/uL   Basophils Relative 1  0 - 1 %   Basophils Absolute 0.0  0.0 - 0.1 K/uL  COMPREHENSIVE METABOLIC PANEL     Status: Abnormal   Collection Time    11/30/12  5:40 AM      Result Value Range   Sodium 139  135 - 145 mEq/L   Potassium 4.6  3.5 - 5.1 mEq/L  Chloride 103  96 - 112 mEq/L   CO2 25  19 - 32 mEq/L   Glucose, Bld 77  70 - 99 mg/dL   BUN 12  6 - 23 mg/dL   Creatinine, Ser 1.61  0.50 - 1.10 mg/dL   Calcium 9.2  8.4 - 09.6 mg/dL   Total Protein 6.8  6.0 - 8.3 g/dL   Albumin 3.3 (*) 3.5 - 5.2 g/dL   AST 27  0 - 37 U/L   ALT 11  0 - 35 U/L   Alkaline Phosphatase 79  39 - 117 U/L   Total Bilirubin 0.1 (*) 0.3 - 1.2 mg/dL   GFR calc non Af Amer 64 (*) >90 mL/min   GFR calc Af Amer 74 (*) >90 mL/min   Comment: (NOTE)     The eGFR has been calculated using the CKD EPI equation.     This calculation has not been validated in all clinical situations.     eGFR's persistently <90 mL/min signify possible Chronic Kidney     Disease.  PROTIME-INR     Status: Abnormal   Collection Time    11/30/12  5:40 AM      Result Value Range   Prothrombin Time 23.5 (*) 11.6 - 15.2 seconds   INR 2.17 (*) 0.00 - 1.49  HEPARIN LEVEL (UNFRACTIONATED)     Status: Abnormal   Collection Time    11/30/12  5:40 AM      Result Value Range   Heparin  Unfractionated <0.10 (*) 0.30 - 0.70 IU/mL   Comment:            IF HEPARIN RESULTS ARE BELOW     EXPECTED VALUES, AND PATIENT     DOSAGE HAS BEEN CONFIRMED,     SUGGEST FOLLOW UP TESTING     OF ANTITHROMBIN III LEVELS.  PROTIME-INR     Status: Abnormal   Collection Time    12/01/12  7:38 AM      Result Value Range   Prothrombin Time 24.5 (*) 11.6 - 15.2 seconds   INR 2.29 (*) 0.00 - 1.49  PROTIME-INR     Status: Abnormal   Collection Time    12/02/12  6:00 AM      Result Value Range   Prothrombin Time 28.7 (*) 11.6 - 15.2 seconds   INR 2.82 (*) 0.00 - 1.49    Patient Vitals for the past 24 hrs:  BP Temp Temp src Pulse Resp SpO2  12/02/12 0542 100/68 mmHg 97.5 F (36.4 C) Axillary 57 18 100 %  12/01/12 1500 129/80 mmHg 97.4 F (36.3 C) Oral 54 16 54 %    Intake/Output Summary (Last 24 hours) at 12/02/12 0856 Last data filed at 12/01/12 1800  Gross per 24 hour  Intake    960 ml  Output      0 ml  Net    960 ml    General- frail, NAD- Enclosure bed HEENT: normal  N/C O2 Cardio: RRR and no murmurs Resp: CTA B/L GI: BS positive and non distended Extremity:  Pulses positive Skin:   Intact Neuro: Confused, Cranial Nerve II-XII normal, Abnormal Sensory unable to assess due to dysarthria-severe, Abnormal Motor 4/5 in BUE and BLE and Dysarthric, ataxia L FNF   Assessment/Plan: 1. Functional deficits secondary to Left cerebellar infarct which require 3+ hours per day of interdisciplinary therapy in a comprehensive inpatient rehab setting.  2. DVT Prophylaxis/Anticoagulation: Coumadin therapy. INR therapeutic (2.82 today)  3. Dysphagia. Dysphagia 1  nectar liquids. Followup speech therapy. Monitor for any signs of aspiration   4. Small non-Q wave myocardial infarction/severe nonischemic cardiomyopathy/apical thrombus.     Continue conservative care and maintain Coumadin therapy. Followup cardiology services  5. Hypertension. Altace 2.5 mg daily, Coreg 3.125 mg twice a day.  Monitor with increased mobility  6.  History of polysubstance abuse. Urine drug screen negative.      LOS (Days) 3 A FACE TO FACE EVALUATION WAS PERFORMED  Rogelia Boga 12/02/2012, 8:56 AM

## 2012-12-02 NOTE — Progress Notes (Signed)
ANTICOAGULATION CONSULT NOTE - Follow Up Consult  Pharmacy Consult for warfarin Indication: cardioembolic CVA with apical thrombus   Patient Measurements: Height: 5\' 1"  (154.9 cm) Weight: 80 lb (36.288 kg) IBW/kg (Calculated) : 47.8 Heparin dosing weight: 36 kg  Vital Signs: Temp: 97.5 F (36.4 C) (10/12 0542) Temp src: Axillary (10/12 0542) BP: 100/68 mmHg (10/12 0542) Pulse Rate: 57 (10/12 0542)  Labs:  Recent Labs  11/29/12 2033 11/30/12 0540 12/01/12 0738 12/02/12 0600  HGB  --  13.6  --   --   HCT  --  40.5  --   --   PLT  --  194  --   --   LABPROT  --  23.5* 24.5* 28.7*  INR  --  2.17* 2.29* 2.82*  HEPARINUNFRC <0.10* <0.10*  --   --   CREATININE  --  0.91  --   --     Estimated Creatinine Clearance: 34.4 ml/min (by C-G formula based on Cr of 0.91).   Assessment: 67 yo female who presented to the ED on 10/4 with dysarthria and slurred speech and was given tPA at for confirmed infarcts on MRI. 2D echo also reports a small apical thrombus. Pt continued on coumadin with a therapeutic INR but INR did jump up quite a bit from yesterday. No bleeding noted, no new CBC today.   Goal of Therapy:  INR 2-3   Plan:  1. Warfarin 2mg  PO x 1 tonight (50% decrease in dose d/t jump in INR) 2. F/u AM INR  Lysle Pearl, PharmD, BCPS Pager # [phone] 12/02/2012 8:13 AM

## 2012-12-03 ENCOUNTER — Inpatient Hospital Stay (HOSPITAL_COMMUNITY): Payer: Medicare Other

## 2012-12-03 ENCOUNTER — Ambulatory Visit (HOSPITAL_COMMUNITY): Payer: Medicare Other

## 2012-12-03 ENCOUNTER — Inpatient Hospital Stay (HOSPITAL_COMMUNITY): Payer: Medicare Other | Admitting: *Deleted

## 2012-12-03 MED ORDER — WARFARIN SODIUM 3 MG PO TABS
3.0000 mg | ORAL_TABLET | Freq: Once | ORAL | Status: AC
  Administered 2012-12-03: 3 mg via ORAL
  Filled 2012-12-03: qty 1

## 2012-12-03 MED ORDER — ADULT MULTIVITAMIN W/MINERALS CH
1.0000 | ORAL_TABLET | Freq: Every day | ORAL | Status: DC
  Administered 2012-12-03 – 2012-12-12 (×10): 1 via ORAL
  Filled 2012-12-03 (×12): qty 1

## 2012-12-03 MED ORDER — ENSURE PUDDING PO PUDG
1.0000 | Freq: Three times a day (TID) | ORAL | Status: DC
  Administered 2012-12-03 – 2012-12-12 (×20): 1 via ORAL

## 2012-12-03 MED ORDER — WHITE PETROLATUM GEL
Status: AC
  Filled 2012-12-03: qty 5

## 2012-12-03 NOTE — Progress Notes (Signed)
Social Work Assessment and Plan Social Work Assessment and Plan  Patient Details  Name: [name] MRN: [id number] Date of Birth: [birth year]  Today's Date: 12/03/2012  Problem List:  Patient Active Problem List   Diagnosis Date Noted  . Embolic cerebral infarction 11/29/2012  . Protein-calorie malnutrition, severe 11/27/2012  . Stroke 11/24/2012  . Acute ischemic stroke 06/24/2012  . CVA (cerebral infarction) 10/11/2011  . Chest pain at rest 04/08/2011    Class: Acute  . Hypertension   . Arthralgia   . Breast cancer, stage 2 02/10/2011  . Trouble swallowing   . Breast cancer 01/28/2011  . CHF 12/25/2009  . Cerebral embolism with cerebral infarction 05/26/2009  . 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 LIVER FUNCTION TESTS, ABNORMAL, HX OF 05/14/2009  . CONSTIPATION 04/13/2009  . HYPERLIPIDEMIA 04/06/2009  . SCHIZOPHRENIA 04/06/2009  . ANXIETY 04/06/2009  . COCAINE ABUSE 04/06/2009  . HYPERTENSION, BENIGN ESSENTIAL 04/06/2009  . MYOCARDIAL INFARCTION 04/06/2009  . ASTHMA 04/06/2009  . COPD 04/06/2009  . CHOLELITHIASIS 04/06/2009  . RENAL FAILURE, ACUTE 04/06/2009  . CAD 03/31/2009   Past Medical History:  Past Medical History  Diagnosis Date  . Weight loss, unintentional   . Trouble swallowing   . Change in voice   . Substance abuse   . Dementia   . Asthma   . Shortness of breath   . Seizures     "    It has been along time "  . CHF (congestive heart failure)   . Headache(784.0)   . Hypertension   . Arthralgia   . Tremors of nervous system   . Anxiety   . Rib fractures     hx of May 2012  . Heart attack 05/21/10  . Schizophrenia   . Coronary artery disease   . Stroke 07/11/10    hx of R CVA   . COPD (chronic obstructive pulmonary disease)   . Obstructive sleep apnea   . Breast cancer 01/28/11    L breast, inv ductal/in situ, ER/PR -, Her2 -   Past Surgical History:  Past Surgical History  Procedure Laterality Date  . Total abdominal  hysterectomy  age 26   Social History:  reports that she quit smoking about 4 years ago. Her smoking use included Cigarettes. She has a 50 pack-year smoking history. She has never used smokeless tobacco. She reports that she drinks alcohol. She reports that she uses illicit drugs.  Family / Support Systems Marital Status: Widow/Widower Patient Roles: Parent;Other (Comment) (Aunt) Other Supports: Dossie Arbour  [phone]  Portor Chance-sister  [phone]  [phone]  Erasmo Score  [phone]-Rhonda's mother Anticipated Caregiver: Bjorn Loser lives with pt Ability/Limitations of Caregiver: Bjorn Loser at times is gone for 1-2 hours at a time, but will try to arrange 24 hr care for pt at discharge. Caregiver Availability: 24/7 Family Dynamics: Pt and Bjorn Loser have lived together for a while now, Bjorn Loser has been providing care to pt prior to admission.  Pt's sister's are supportive and visit also.  Pt's daughter is not close with pt.  Social History Preferred language: English Religion: Baptist Cultural Background: No issues Education: [employer] Read: Yes Write: Yes Employment Status: Disabled Fish farm manager Issues: No issues Guardian/Conservator: NOne-according to MD pt is not capable of making her own decisions.  Will look toward sister's and niece since no other family member are involved.   Abuse/Neglect Physical Abuse: Denies Verbal Abuse: Denies Sexual Abuse: Denies Exploitation of patient/patient's resources: Denies Self-Neglect: Denies  Emotional Status Pt's affect, behavior adn adjustment status: Pt is willing to attend and participate in therapies.  She is very difficult to understand with this stroke, her speech has been affected.  Bjorn Loser reports: " She has always gvien it her all, but is set in her ways."  This pt is known to me due to previous admit from last year. Recent Psychosocial Issues: Other medical conditions, past strokes with existing  deficits.  Bjorn Loser reports: " She does eat at home, unsure how she continues to lose weight." Pyschiatric History: History of schizophrenia does not take meds for this and has not for years.  Unable to perform depression screening due to pt's speech is so difficult to understand but will continue to reassess for appropriateness to assess. Substance Abuse History: History of cocaine and tobacco abuse but has since quit  Patient / Family Perceptions, Expectations & Goals Pt/Family understanding of illness & functional limitations: Bjorn Loser can explain her aunt's condition and deficits.  She is willing to come in and go through therpaies with pt and will prior to discharge.  Dicussed this was not done previously and was needed, since she went home at a min-supervision level. Premorbid pt/family roles/activities: Aunt, Sister, Mom, Retiree, etc Anticipated changes in roles/activities/participation: resume at discharge Pt/family expectations/goals: Pt states: " I want to get [place] here."  Bjorn Loser states: " I hope she can get back to the level she was, but will help if needed."  [employer]: Other (Comment) (Had in past) Premorbid Home Care/DME Agencies: Other (Comment) (Has DME from previous admits) Transportation available at discharge: Family Resource referrals recommended: Support group (specify) (CVA Support group)  Discharge Planning Living Arrangements: Other relatives Support Systems: Other relatives;Friends/neighbors Type of Residence: Private residence Insurance Resources: Medicare;Medicaid (specify county) ([place]) Surveyor, quantity Resources: SSD Financial Screen Referred: No Living Expenses: Lives with family Money Management: Family Does the patient have any problems obtaining your medications?: No Home Management: Niece does home management Patient/Family Preliminary Plans: Return home with Rhonda-niece who can assist and was prior to admission.  Niece does  recognize she has communication issues from this stroke and will come in and attend therapies with her while here.  Pt did have home O2 already.  Informed Bjorn Loser pt will require 24 hr care at discharge from here. Social Work Anticipated Follow Up Needs: HH/OP;Support Group  Clinical Impression This pt is known to me from previous admissions-she has severe communication deficits form this stroke and will need 24 hour care at discharge.  Have infomrmed niece of this and will expect her to come in for Family education prior to discharge.  This was an issue in the past with this family.  Pt is very cooperative and is participating in therapies.  Work on a safe discharge plan.  Lucy Chris 12/03/2012, 10:52 AM

## 2012-12-03 NOTE — Progress Notes (Addendum)
INITIAL NUTRITION ASSESSMENT  DOCUMENTATION CODES Per approved criteria  -Severe malnutrition in the context of chronic illness -Underweight   INTERVENTION: Add Ensure Pudding po TID, each supplement provides 170 kcal and 4 grams of protein.  Discontinue Ensure Complete po BID. Continue Magic cup TID with all meals, each supplement provides 290 kcal and 9 grams of protein. Add MVI daily. RD to continue to follow nutrition care plan.  NUTRITION DIAGNOSIS: Malnutrition related to chronic illness as evidenced by severe fat and muscle wasting.   Goal: Pt to meet >/= 90% of their estimated nutrition needs   Monitor:  PO intake, supplement acceptance, weight trend, labs   Reason for Assessment: PO intake  67 y.o. female  Admitting Dx: Embolic cerebral infarction  ASSESSMENT: Pt with hx of multiple strokes who is non-compliant with her medications is admitted with new left cerebellar and right posterior strokes. Pt somewhat difficult to understand.   Discussed nutrition with RN. She notes that pt does okay with liquids but PO intake of meals is minimal. Will add Ensure Pudding at this time.   Nutrition Focused Physical Exam:  Subcutaneous Fat:  Orbital Region: severe wasting Upper Arm Region: severe wasting Thoracic and Lumbar Region: severe wasting  Muscle:  Temple Region: severe wasting Clavicle Bone Region: severe wasting Clavicle and Acromion Bone Region: severe wasting Scapular Bone Region: mild-moderate wasting Dorsal Hand: mild-moderate wasting Patellar Region: severe wasting Anterior Thigh Region: severe wasting Posterior Calf Region: severe wasting  Edema: not present   Height: Ht Readings from Last 1 Encounters:  11/29/12 5\' 1"  (1.549 m)    Weight: Wt Readings from Last 1 Encounters:  11/29/12 80 lb (36.288 kg)    Ideal Body Weight: 47.7 kg   % Ideal Body Weight: 77%  Wt Readings from Last 10 Encounters:  11/29/12 80 lb (36.288 kg)  11/24/12  88 lb 2.9 oz (40 kg)  06/24/12 87 lb 6.4 oz (39.644 kg)  10/19/11 97 lb 7.1 oz (44.2 kg)  10/10/11 97 lb 3.6 oz (44.1 kg)  04/13/11 100 lb 8.5 oz (45.6 kg)  04/06/11 102 lb (46.267 kg)  03/01/11 101 lb 14.4 oz (46.222 kg)  02/28/11 104 lb 11.2 oz (47.492 kg)  02/19/11 100 lb 8.5 oz (45.6 kg)    Usual Body Weight: 100 lb per pt  % Usual Body Weight: 80%  BMI:  Body mass index is 15.12 kg/(m^2). Underweight  Estimated Nutritional Needs: Kcal: 1200-1400 Protein: 60-70 grams Fluid: > 1.5 L/day  Skin: stage II pressure ulcer to coccyx  Diet Order: Dysphagia 1 with Nectar Thick Liquids  EDUCATION NEEDS: -No education needs identified at this time   Intake/Output Summary (Last 24 hours) at 12/03/12 1225 Last data filed at 12/03/12 0800  Gross per 24 hour  Intake    580 ml  Output      0 ml  Net    580 ml    Last BM: 10/10  Labs:   Recent Labs Lab 11/30/12 0540  NA 139  K 4.6  CL 103  CO2 25  BUN 12  CREATININE 0.91  CALCIUM 9.2  GLUCOSE 77    CBG (last 3)  No results found for this basename: GLUCAP,  in the last 72 hours  Scheduled Meds: . antiseptic oral rinse  15 mL Mouth Rinse q12n4p  . carvedilol  3.125 mg Oral BID WC  . feeding supplement (ENSURE COMPLETE)  237 mL Oral BID BM  . pantoprazole  40 mg Oral QHS  . ramipril  2.5 mg Oral Daily  . warfarin  3 mg Oral ONCE-1800  . Warfarin - Pharmacist Dosing Inpatient   Does not apply q1800    Continuous Infusions:    Past Medical History  Diagnosis Date  . Weight loss, unintentional   . Trouble swallowing   . Change in voice   . Substance abuse   . Dementia   . Asthma   . Shortness of breath   . Seizures     "    It has been along time "  . CHF (congestive heart failure)   . Headache(784.0)   . Hypertension   . Arthralgia   . Tremors of nervous system   . Anxiety   . Rib fractures     hx of May 2012  . Heart attack 05/21/10  . Schizophrenia   . Coronary artery disease   . Stroke  07/11/10    hx of R CVA   . COPD (chronic obstructive pulmonary disease)   . Obstructive sleep apnea   . 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 Total abdominal hysterectomy  age [address] [place], [place], [place] Pager: [phone] After-hours pager: [phone]

## 2012-12-03 NOTE — Progress Notes (Signed)
Occupational Therapy Session Note  Patient Details  Name: [name] MRN: [id number] Date of Birth: 1945/08/19  Today's Date: 12/03/2012 Time: 0930-1030 and 1430-1500  Time Calculation (min): 60 min and 30 min   Short Term Goals: Week 1:  OT Short Term Goal 1 (Week 1): Pt will wash 9/10 body parts with min verbal cues and steadying assist  OT Short Term Goal 2 (Week 1): Pt will complete stand pivot toilet transfer with min assist and weight bearing through LLE OT Short Term Goal 3 (Week 1): Pt will complete toilet task with min assist   Skilled Therapeutic Interventions/Progress Updates:    Session 1: Therapy session focused on following commands, functional transfers, standing balance and postural control during ADL retraining. Pt received at nursing station reporting "I'm cold" and required mod verbal cues to re-direct to therapy. Pt required mod verbal cues for encouragement for bathing and therapy session as she continuously stated "no" however unable to determine pt's needs secondary to cognitive linguistic deficits. Once in shower pt required min verbal cues to wash all body parts requiring min-mod assist for standing balance (not weightbearing through LLE) and min assist for thoroughness for buttocks hygiene. Completed stand/squat pivot transfers with mod assist. Pt very anxious during transfers and having difficulty following commands for squat pivot transfers. Pt required min-mod verbal cues to focus on ADL tasks. Completed dressing at sink with mirror as visual cue when standing to manage pants around waist. Pt required therapist to orient undergarments as she attempted to thread arms into leg holes. Pt able to orient all other clothing after presenting item. Engaged in standing at sink with min-mod assist and therapist manually facilitating upright posture during standing. Pt requesting to return to bed at end of session. Required mod assist for stand/squat pivot transfer. Pt left on  3L of O2.   Session 2: Pt received at nursing station. Therapy session focused on standing tolerance/balance, FM coordination, and cognitive skills. Pt inially began session in standing with min-mod assist standing balance while engaging in card matching activity. Pt with increased difficulty with matching so change focus of task to cognitive skills and completed in sitting. Blocked all cards except 10 and pt unable to match any cards, unable to identify numbers on cards, and unable to identify color of card. Engaged in FM coordination activity with medium pegs. Unable to follow 2 step command and required [facility] assist to place first peg to increase understanding of task. Pt demonstrating visual impairment during functional task as she would feel for hole and demonstrated over and undershooting when placing peg. Pt using LUE throughout task without cues and demonstrating mild-mod incoordination.. Vision to be further test during other functional tasks and cognition impacts ability to complete formal visual test. Pt on 3L of O2 throughout session.   Therapy Documentation Precautions:  Precautions Precautions: Fall Precaution Comments: severe dysarthria Restrictions Weight Bearing Restrictions: No General:   Vital Signs:   Pain: Pt with no c/o pain during therapy sessions.  See FIM for current functional status  Therapy/Group: Individual Therapy  Daneil Dan 12/03/2012, 11:59 AM

## 2012-12-03 NOTE — Plan of Care (Signed)
Problem: RH BOWEL ELIMINATION Goal: RH STG MANAGE BOWEL WITH ASSISTANCE STG Manage Bowel with minimal Assistance.  Outcome: Not Progressing  No BM since 10/10 Goal: RH STG MANAGE BOWEL W/MEDICATION W/ASSISTANCE STG Manage Bowel with Medication with minimal Assistance.  Outcome: Not Progressing No BM since 10/10= sorbitol given

## 2012-12-03 NOTE — Progress Notes (Signed)
Physical Therapy Session Note  Patient Details  Name: [name] MRN: [id number] Date of Birth: [birth year]  Today's Date: 12/03/2012 Time: 0830-0930 Time Calculation (min): 60 min  Short Term Goals: Week 1:  PT Short Term Goal 1 (Week 1): Patient will perform bed<>wheelchair transfers minA.  PT Short Term Goal 2 (Week 1): Patient will negotiate 1 step LRAD, maxA.  PT Short Term Goal 3 (Week 1): Patient will propel wheelchair 25' minA.   Skilled Therapeutic Interventions/Progress Updates:    Patient received seated EOB, nursing present. Session focused on wheelchair mobility and functional transfers. See below for details. Patient instructed in wheelchair mobility 75'x1 and 50'1 minA, verbal cuing for hand placement, use of LEs to assist propulsion and steering, and for sequencing. Ended session patient seated in wheelchair with seatbelt donned on 3L O2 via [place], at nurses station, patients nurse notified.   Therapy Documentation Precautions:  Precautions Precautions: Fall Precaution Comments: severe dysarthria Restrictions Weight Bearing Restrictions: No General:   Vital Signs:   Pain: Pain Assessment Pain Assessment: No/denies pain Pain Score: 0-No pain Mobility: Transfers Transfers: Yes Sit to Stand: 3: Mod assist;From bed;From chair/3-in-1;With armrests Sit to Stand Details: Tactile cues for weight shifting;Tactile cues for weight beaing;Verbal cues for technique;Verbal cues for precautions/safety;Verbal cues for safe use of DME/AE;Manual facilitation for placement;Manual facilitation for weight bearing;Manual facilitation for weight shifting Sit to Stand Details (indicate cue type and reason): Patient instructed in sit<>stand x7 from mat table with RW to facilitate increased activity tolerance and functional transfers. Patient demonstrates no WB through L LE (no WB restrictions), manual facilitation provided above L knee to increase weight bearing through L LE during  sit>stand transfer and manual facilitation at hips for L weight shift. Verbal/tactile cues provided for upright posture, hand placement, decreased R lateral lean, and increased weight bearing thruogh L LE. Patient unable to bear weight through L LE without facilitation.  Stand to Sit: 3: Mod assist;4: Min assist;To chair/3-in-1;To bed;With armrests Stand to Sit Details (indicate cue type and reason): Tactile cues for weight shifting;Tactile cues for placement;Verbal cues for sequencing;Verbal cues for technique;Verbal cues for precautions/safety;Verbal cues for safe use of DME/AE;Manual facilitation for placement;Manual facilitation for weight bearing;Manual facilitation for weight shifting Stand to Sit Details: Patient stand>sit on mat table, verbal cuing for hand placement on mat, increased weight bearing through L LE, and to sit slow and controlled.   Stand Pivot Transfers: 3: Mod assist Stand Pivot Transfer Details: Tactile cues for placement;Tactile cues for weight beaing;Tactile cues for posture;Verbal cues for sequencing;Verbal cues for technique;Verbal cues for precautions/safety;Verbal cues for safe use of DME/AE Stand Pivot Transfer Details (indicate cue type and reason): Patient instructed in stand pivot transfer bed>wheelchair modA no AD, verbal/tactile cuing for hand placement, turning to sit, increased weight bearing through LLE, and to take pivot steps with R LE. Patient does not bear weight through L LE and therefore pivots on R LE only. Patient isntructed in stand pivot transfers x2 trials mat<>wheelchair with RW modA, verbal/tactle cues for management of RW, turning all the way prior to sitting, and  reaching for armrests prior to sitting, provided manual facilitation to L LE to increase weight bearing during transfer.  Locomotion : Ambulation Ambulation: No Gait Gait: No Stairs / Additional Locomotion Stairs: No Wheelchair Mobility Wheelchair Mobility: Yes Wheelchair Assistance: 4:  Administrator, sports Details: Tactile cues for initiation;Tactile cues for placement;Verbal cues for sequencing;Verbal cues for technique;Verbal cues for precautions/safety;Verbal cues for safe use of DME/AE;Visual cues/gestures  for sequencing Wheelchair Propulsion: Both upper extremities;Both lower extermities Wheelchair Parts Management: Needs assistance Distance: 76' (and 50')      See FIM for current functional status  Therapy/Group: Individual Therapy  Doralee Albino 12/03/2012, 12:16 PM

## 2012-12-03 NOTE — Progress Notes (Signed)
ANTICOAGULATION CONSULT NOTE - Follow Up Consult  Pharmacy Consult for coumadin Indication: embolic CVA  Allergies  Allergen Reactions  . Codeine Itching    All over the body  . Penicillins Hives and Other (See Comments)    "[place]ps" per patient.  . Hydralazine     02/28/11 Family unsure if this is allergy for pt    Patient Measurements: Height: 5\' 1"  (154.9 cm) Weight: 80 lb (36.288 kg) IBW/kg (Calculated) : 47.8 Heparin Dosing Weight:   Vital Signs: Temp: 97.6 F (36.4 C) (10/13 0500) Temp src: Axillary (10/13 0500) BP: 117/68 mmHg (10/13 0500) Pulse Rate: 56 (10/13 0500)  Labs:  Recent Labs  12/01/12 0738 12/02/12 0600 12/03/12 0533  LABPROT 24.5* 28.7* 26.0*  INR 2.29* 2.82* 2.48*    Estimated Creatinine Clearance: 34.4 ml/min (by C-G formula based on Cr of 0.91).   Medications:  Scheduled:  . antiseptic oral rinse  15 mL Mouth Rinse q12n4p  . carvedilol  3.125 mg Oral BID WC  . feeding supplement (ENSURE COMPLETE)  237 mL Oral BID BM  . pantoprazole  40 mg Oral QHS  . ramipril  2.5 mg Oral Daily  . Warfarin - Pharmacist Dosing Inpatient   Does not apply q1800   Infusions:    Assessment: 51 female with embolic CVA is currently on therapeutic coumadin.  INR down to 2.48 from 2.82 after big jump in INR on 10/12.   Goal of Therapy:  INR 2-3 Monitor platelets by anticoagulation protocol: Yes   Plan:  1) Coumadin 3mg  po x1 2) INR in am  [name], [name] 12/03/2012,8:35 AM

## 2012-12-03 NOTE — Care Management Note (Signed)
[facility] Individual Statement of Services  Patient Name:  [name]  Date:  12/03/2012  Welcome to the [facility].  Our goal is to provide you with an individualized program based on your diagnosis and situation, designed to meet your specific needs.  With this comprehensive rehabilitation program, you will be expected to participate in at least 3 hours of rehabilitation therapies Monday-Friday, with modified therapy programming on the weekends.  Your rehabilitation program will include the following services:  Physical Therapy (PT), Occupational Therapy (OT), Speech Therapy (ST), 24 hour per day rehabilitation nursing, Case Management (Social Worker), Rehabilitation Medicine, Nutrition Services and Pharmacy Services  Weekly team conferences will be held on Wednesday to discuss your progress.  Your Social Worker will talk with you frequently to get your input and to update you on team discussions.  Team conferences with you and your family in attendance may also be held.  Expected length of stay: 2 weeks Overall anticipated outcome: supervision/mod assist  Depending on your progress and recovery, your program may change. Your Social Worker will coordinate services and will keep you informed of any changes. Your Social Worker's name and contact numbers are listed  below.  The following services may also be recommended but are not provided by the [facility]:    Home Health Rehabiltiation Services  Outpatient Rehabilitation Services    Arrangements will be made to provide these services after discharge if needed.  Arrangements include referral to agencies that provide these services.  Your insurance has been verified to be:  Medicare & Medicaid Your primary doctor is:  DR Rinaldo Cloud  Pertinent information will be shared with your doctor and your insurance company.  Social Worker:  Dossie Der, SW [phone] or  [phone]  Information discussed with and copy given to patient by: Lucy Chris, 12/03/2012, 10:57 AM

## 2012-12-03 NOTE — Plan of Care (Signed)
Problem: RH SAFETY Goal: RH STG ADHERE TO SAFETY PRECAUTIONS W/ASSISTANCE/DEVICE STG Adhere to Safety Precautions With TOTAL Assistance/Device.  Outcome: Progressing Pt fearful of closed spaces and did not like enclosure bed; switch back to SRX4 with alarm and door open at all times

## 2012-12-03 NOTE — Progress Notes (Signed)
I have reviewed and agree with the treatment as reflected in this note. Jakyrie Totherow, PT DPT  

## 2012-12-03 NOTE — Progress Notes (Signed)
Subjective/Complaints: 67 y.o. 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 Admitted 11/24/2012 with slurred speech. MRI of the brain showed acute infarcts affecting the left cerebellum and right posterior temporal regions. Also noted extensive encephalomalacia related to remote bihemispheric infarction. 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 Echocardiogram with ejection fraction of 15% no wall motion abnormalities and small apical thrombus. Carotid Dopplers with no ICA stenosis. Neurology services consulted. Patient did receive TPA. Findings of elevated troponin/small non-Q-wave myocardial infarction and followup cardiology services  Placed in enclosure bed Review of Systems - Negative except dysarthria, severe  Objective: Vital Signs: Blood pressure 117/68, pulse 56, temperature 97.6 F (36.4 C), temperature source Axillary, resp. rate 16, height 5\' 1"  (1.549 m), weight 36.288 kg (80 lb), SpO2 99.00%. No results found. Results for orders placed during the hospital encounter of 11/29/12 (from the past 72 hour(s))  PROTIME-INR     Status: Abnormal   Collection Time    12/01/12  7:38 AM      Result Value Range   Prothrombin Time 24.5 (*) 11.6 - 15.2 seconds   INR 2.29 (*) 0.00 - 1.49  PROTIME-INR     Status: Abnormal   Collection Time    12/02/12  6:00 AM      Result Value Range   Prothrombin Time 28.7 (*) 11.6 - 15.2 seconds   INR 2.82 (*) 0.00 - 1.49  PROTIME-INR     Status: Abnormal   Collection Time    12/03/12  5:33 AM      Result Value Range   Prothrombin Time 26.0 (*) 11.6 - 15.2 seconds   INR 2.48 (*) 0.00 - 1.49     HEENT: normal Cardio: RRR and no murmurs Resp: CTA B/L GI: BS positive  and non distended Extremity:  Pulses positive Skin:   Intact Neuro: Confused, Cranial Nerve II-XII normal, Abnormal Sensory unable to assess due to dysarthria-severe, Abnormal Motor 4/5 in BUE and BLE and Dysarthric, ataxia L FNF Musc/Skel:  Normal Gen NAD   Assessment/Plan: 1. Functional deficits secondary to Left cerebellar infarct which require 3+ hours per day of interdisciplinary therapy in a comprehensive inpatient rehab setting. Physiatrist is providing close team supervision and 24 hour management of active medical problems listed below. Physiatrist and rehab team continue to assess barriers to discharge/monitor patient progress toward functional and medical goals. FIM: FIM - Bathing Bathing Steps Patient Completed: Chest;Right Arm;Left Arm;Abdomen;Left upper leg;Right upper leg;Front perineal area;Right lower leg (including foot);Left lower leg (including foot) Bathing: 4: Min-Patient completes 8-9 75f 10 parts or 75+ percent  FIM - Upper Body Dressing/Undressing Upper body dressing/undressing steps patient completed: Thread/unthread right sleeve of pullover shirt/dresss;Thread/unthread left sleeve of pullover shirt/dress;Put head through opening of pull over shirt/dress;Pull shirt over trunk Upper body dressing/undressing: 6: More than reasonable amount of time FIM - Lower Body Dressing/Undressing Lower body dressing/undressing steps patient completed: Thread/unthread right pants leg;Thread/unthread left pants leg Lower body dressing/undressing: 3: Mod-Patient completed 50-74% of tasks  FIM - Toileting Toileting steps completed by patient: Adjust clothing after toileting Toileting Assistive Devices: Grab bar or rail for support Toileting: 2: Max-Patient completed 1 of 3 steps  FIM - Diplomatic Services operational officer Devices: Psychiatrist Transfers: 3-To toilet/BSC: Mod A (lift or lower  assist);4-From toilet/BSC: Min A (steadying Pt. > 75%)  FIM -  Bed/Chair Transfer Bed/Chair Transfer Assistive Devices: Arm rests;Bed rails;HOB elevated Bed/Chair Transfer: 5: Supine > Sit: Supervision (verbal cues/safety issues);5: Sit > Supine: Supervision (verbal cues/safety issues);3: Bed > Chair or W/C: Mod A (lift or lower assist);3: Chair or W/C > Bed: Mod A (lift or lower assist)  FIM - Locomotion: Wheelchair Distance: 10' Locomotion: Wheelchair: 1: Travels less than 50 ft with minimal assistance (Pt.>75%) FIM - Locomotion: Ambulation Locomotion: Ambulation Assistive Devices: Other (comment) (R handrail) Ambulation/Gait Assistance: 2: Max assist Locomotion: Ambulation: 1: Travels less than 50 ft with maximal assistance (Pt: 25 - 49%)  Comprehension Comprehension Mode: Auditory Comprehension: 3-Understands basic 50 - 74% of the time/requires cueing 25 - 50%  of the time  Expression Expression Mode: Verbal Expression: 3-Expresses basic 50 - 74% of the time/requires cueing 25 - 50% of the time. Needs to repeat parts of sentences.  Social Interaction Social Interaction: 4-Interacts appropriately 75 - 89% of the time - Needs redirection for appropriate language or to initiate interaction.  Problem Solving Problem Solving: 2-Solves basic 25 - 49% of the time - needs direction more than half the time to initiate, plan or complete simple activities  Memory Memory: 1-Recognizes or recalls less than 25% of the time/requires cueing greater than 75% of the time   Medical Problem List and Plan:  1. 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 DVT Prophylaxis/Anticoagulation: Coumadin therapy. Monitor for any bleeding episodes. Continue heparin until INR between 2.00 and 3.0  3. Pain Management: Tylenol as needed  4. Dysphagia. Dysphagia 1 nectar liquids. Followup speech therapy. Monitor for any signs of aspiration  5. Neuropsych: This patient is not capable of making decisions on her own behalf.  6. Small non-Q wave myocardial  infarction/severe nonischemic cardiomyopathy/apical thrombus. Continue conservative care and maintain Coumadin therapy. Followup cardiology services  7. Hypertension. Altace 2.5 mg daily, Coreg 3.125 mg twice a day. Monitor with increased mobility  8. History of polysubstance abuse. Urine drug screen negative.     LOS (Days) 4 A FACE TO FACE EVALUATION WAS PERFORMED  Surina Storts E 12/03/2012, 9:07 AM

## 2012-12-03 NOTE — Progress Notes (Signed)
Speech Language Pathology Daily Session Note  Patient Details  Name: [name] MRN: [id number] Date of Birth: 1945/12/17  Today's Date: 12/03/2012 Time: 1130-1215 Time Calculation (min): 45 min  Short Term Goals: Week 1: SLP Short Term Goal 1 (Week 1): Pt will consume Dys. 1 textures and nectar thick liquids with minimal overt s/s of aspiration. SLP Short Term Goal 2 (Week 1): Pt will perform oral motor exercises with Max cues. SLP Short Term Goal 3 (Week 1): Pt will utilize speech intelligibility strategies with Max cues. SLP Short Term Goal 4 (Week 1): Pt will utilize external aids to answer orientation questions with Max cues. SLP Short Term Goal 5 (Week 1): Pt will utilize external memory aids with Max cues. SLP Short Term Goal 6 (Week 1): Pt will sustain attention to functional task for 15 min with Max cues.  Skilled Therapeutic Interventions: Treatment focused on speech, swallowing, and cognitive goals. SLP facilitated session with skilled observation of lunch meal, consisting of Dys. 1 textures and nectar thick liquids. Pt required Mod cues for utilization of strategies and to swallow before talking. No overt s/s of aspiration observed. Max cues were provided to locate her spoon on her tray. SLP introduced oral-motor exercises, which attempted to complete with difficulty despite Max cues. Pt yelled out "nurse" and when asked was able to verbalize that she needed a drink with Min cues. Overall, speech appeared to be mildly improved today, although still moderate-severely dysarthric. Continue plan of care.   FIM:  Comprehension Comprehension Mode: Auditory Comprehension: 3-Understands basic 50 - 74% of the time/requires cueing 25 - 50%  of the time Expression Expression Mode: Verbal Expression: 3-Expresses basic 50 - 74% of the time/requires cueing 25 - 50% of the time. Needs to repeat parts of sentences. Social Interaction Social Interaction: 4-Interacts appropriately 75 -  89% of the time - Needs redirection for appropriate language or to initiate interaction. Problem Solving Problem Solving: 2-Solves basic 25 - 49% of the time - needs direction more than half the time to initiate, plan or complete simple activities Memory Memory: 1-Recognizes or recalls less than 25% of the time/requires cueing greater than 75% of the time FIM - Eating Eating Activity: 5: Needs verbal cues/supervision;5: Set-up assist for open containers  Pain Pain Assessment Pain Assessment: No/denies pain Pain Score: 0-No pain  Therapy/Group: Individual Therapy  Maxcine Ham 12/03/2012, 1:51 PM  Maxcine Ham, M.A. CCC-SLP [phone]

## 2012-12-03 NOTE — IPOC Note (Addendum)
Overall Plan of Care [facility]) Patient Details Name: [name] MRN: [id number] DOB: [birth year]  Admitting Diagnosis: CVA NIH 8   Hospital Problems: Principal Problem:   Embolic cerebral infarction     Functional Problem List: Nursing Behavior;Bladder;Bowel;Medication Management;Nutrition;Pain;Perception;Safety;Skin Integrity  PT Balance;Endurance;Motor;Safety  OT Balance;Behavior;Cognition;Endurance;Motor;Pain;Perception;Safety;Vision  SLP Cognition;Linguistic  TR         Basic ADL's: OT Grooming;Bathing;Dressing;Toileting     Advanced  ADL's: OT       Transfers: PT Bed Mobility;Bed to Chair;Car;Furniture  OT Toilet;Tub/Shower     Locomotion: PT Ambulation;Wheelchair Mobility;Stairs     Additional Impairments: OT    SLP Swallowing;Communication;Social Cognition comprehension;expression Problem Solving;Memory;Attention;Awareness  TR      Anticipated Outcomes Item Anticipated Outcome  Self Feeding    Swallowing  Min A with least restrictive PO   Basic self-care  supervision  Toileting  supervision    Bathroom Transfers supervision   Bowel/Bladder  Continent of bowel and bladder with minimal assist  Transfers   MinA all transfers  Locomotion   MinA gait with RW x75', 1 step no handrails with LRAD modA  Communication  Min cues  Cognition  Min cues  Pain  3 out 0-10  Safety/Judgment  Mod assistance   Therapy Plan: PT Intensity: Minimum of 1-2 x/day ,45 to 90 minutes PT Frequency: 5 out of 7 days PT Duration Estimated Length of Stay: 1-2 week trial OT Intensity: Minimum of 1-2 x/day, 45 to 90 minutes OT Frequency: 5 out of 7 days OT Duration/Estimated Length of Stay: 2-3 weeks  SLP Intensity: Minumum of 1-2 x/day, 30 to 90 minutes SLP Frequency: 5 out of 7 days SLP Duration/Estimated Length of Stay: 2-3 weeks       Team Interventions: Nursing Interventions Patient/Family Education;Bladder Management;Bowel Management;Pain  Management;Medication Management;Skin Care/Wound Management;Dysphagia/Aspiration Precaution Training;Disease Management/Prevention;Cognitive Remediation/Compensation  PT interventions Ambulation/gait training;Cognitive remediation/compensation;Community reintegration;Discharge planning;Disease management/prevention;DME/adaptive equipment instruction;Functional mobility training;Neuromuscular re-education;Pain management;Patient/family education;Psychosocial support;Splinting/orthotics;Stair training;Therapeutic Activities;Therapeutic Exercise;UE/LE Strength taining/ROM;UE/LE Coordination activities;Wheelchair propulsion/positioning  OT Interventions Balance/vestibular training;Cognitive remediation/compensation;Discharge planning;Community reintegration;DME/adaptive equipment instruction;Functional mobility training;Neuromuscular re-education;Patient/family education;Psychosocial support;Self Care/advanced ADL retraining;Therapeutic Exercise;Therapeutic Activities;UE/LE Strength taining/ROM;Visual/perceptual remediation/compensation;UE/LE Coordination activities  SLP Interventions Cognitive remediation/compensation;Cueing hierarchy;Dysphagia/aspiration precaution training;Environmental controls;Functional tasks;Internal/external aids;Multimodal communication approach;Oral motor exercises;Patient/family education;Speech/Language facilitation  TR Interventions    SW/CM Interventions Discharge Planning;Psychosocial Support;Patient/Family Education    Team Discharge Planning: Destination: PT-Skilled Nursing Facility (SNF) ,OT- Home , SLP-Home Projected Follow-up: PT-Skilled nursing facility, OT-  Home health OT, SLP-Home Health SLP;24 hour supervision/assistance Projected Equipment Needs: PT-None recommended by PT, OT-  , SLP-None recommended by SLP Patient/family involved in discharge planning: PT- Patient,  OT-Patient, SLP-Patient  MD ELOS: 3wks Medical Rehab Prognosis:  Good Assessment: 67 y.o.  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 Admitted 11/24/2012 with slurred speech. MRI of the brain showed acute infarcts affecting the left cerebellum and right posterior temporal regions. Also noted extensive encephalomalacia related to remote bihemispheric infarction. 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 Echocardiogram with ejection fraction of 15% no wall motion abnormalities and small apical thrombus. Carotid Dopplers with no ICA stenosis. Neurology services consulted. Patient did receive TPA. Findings of elevated troponin/small non-Q-wave myocardial infarction and followup cardiology services. No significant changes in EKG. Patient placed on Coumadin therapy for cardioembolic CVA with apical thrombus. Conservative  medical management per cardiology services.   Now requiring 24/7 Rehab RN,MD, as well as CIR level PT, OT and SLP.  Treatment team will focus on ADLs and mobility with goals set at Sup   See Team Conference Notes for weekly updates to the plan of care

## 2012-12-03 NOTE — Plan of Care (Signed)
Problem: RH SAFETY Goal: RH STG ADHERE TO SAFETY PRECAUTIONS W/ASSISTANCE/DEVICE STG Adhere to Safety Precautions With Assistance/Device.  Pt is in [place] bed

## 2012-12-04 ENCOUNTER — Inpatient Hospital Stay (HOSPITAL_COMMUNITY): Payer: Medicare Other | Admitting: *Deleted

## 2012-12-04 ENCOUNTER — Inpatient Hospital Stay (HOSPITAL_COMMUNITY): Payer: Medicare Other

## 2012-12-04 LAB — PROTIME-INR
INR: 2.15 — ABNORMAL HIGH (ref 0.00–1.49)
Prothrombin Time: 23.3 seconds — ABNORMAL HIGH (ref 11.6–15.2)

## 2012-12-04 MED ORDER — WARFARIN SODIUM 3 MG PO TABS
3.0000 mg | ORAL_TABLET | Freq: Once | ORAL | Status: AC
  Administered 2012-12-04: 3 mg via ORAL
  Filled 2012-12-04: qty 1

## 2012-12-04 NOTE — Progress Notes (Signed)
Occupational Therapy Session Note  Patient Details  Name: [name] MRN: [id number] Date of Birth: May 11, 1945  Today's Date: 12/04/2012 Time: 4696-2952 and 1300-1330 Time Calculation (min): 55 min and 30 min   Short Term Goals: Week 1:  OT Short Term Goal 1 (Week 1): Pt will wash 9/10 body parts with min verbal cues and steadying assist  OT Short Term Goal 2 (Week 1): Pt will complete stand pivot toilet transfer with min assist and weight bearing through LLE OT Short Term Goal 3 (Week 1): Pt will complete toilet task with min assist   Skilled Therapeutic Interventions/Progress Updates:    Session 1: Therapy session focused on functional transfers, standing balance, and ADL retraining. Pt received at nurses station continuously stating "I'm cold." Willing to participate in shower. Completed stand pivot transfer w/c>TTB with min assist and mod assist when transferring TTB>w/c (left side). Pt not weightbearing through LLE throughout transfer. Completed bathing with steadying assist for standing balance for peri hygiene and mod verbal cues for body parts. Completed dressing in front of sink from w/c level. Pt required increased time to orient shirt and assist to thread RUE. Pt oriented all LB clothing without assist. Completed sit<>stand with min assist and min assist standing balance when managing around waist. Required cues of encouragement to manage around waist as pt hesitant to alternate UE's for support on sink. After dressing pt requesting to return to bed immediately and began attempting to propel self i w/c towards bed. Therapist assisted with safe return to bed. Pt left with bed rails and bed alarm on and call light in reach. Pt pointed to red buttons when asked what to press if she needed anything.   Session 2: Therapy session focused functional transfers, following directions, standing balance, and self-feeding. Pt received supine in bed. Completed stand pivot transfer bed>w/c with  min assist and pt unable to follow cues for squat pivot transfer. Completed stand pivot transfer w/c>toilet to left with mod assist and toilet>w/c with min assist going to right. Pt had yet to eat lunch and reporting stomach upset. Encouraged pt to eat lunch and she agreed. Pt required max verbal cues for small bites/scoops and to focus on eating as she was attempting to talk with food in mouth. Pt's sister [name] present and able to assist with providing cues. Discussed home setup with her and pt's PLOF. Pt passed off to PT at end of session.   Therapy Documentation Precautions:  Precautions Precautions: Fall Precaution Comments: severe dysarthria Restrictions Weight Bearing Restrictions: No General:   Vital Signs:   Pain: No c/o pain during therapy sessions.   See FIM for current functional status  Therapy/Group: Individual Therapy  Daneil Dan 12/04/2012, 10:31 AM

## 2012-12-04 NOTE — Progress Notes (Signed)
Subjective/Complaints: 67 y.o. 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 Admitted 11/24/2012 with slurred speech. MRI of the brain showed acute infarcts affecting the left cerebellum and right posterior temporal regions. Also noted extensive encephalomalacia related to remote bihemispheric infarction. 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 Echocardiogram with ejection fraction of 15% no wall motion abnormalities and small apical thrombus. Carotid Dopplers with no ICA stenosis. Neurology services consulted. Patient did receive TPA. Findings of elevated troponin/small non-Q-wave myocardial infarction and followup cardiology services  Out of enclosure bed, sleeping comfortably in bed, arouses to voice but tired Review of Systems - Negative except dysarthria, severe  Objective: Vital Signs: Blood pressure 116/78, pulse 44, temperature 98.2 F (36.8 C), temperature source Oral, resp. rate 18, height 5\' 1"  (1.549 m), weight 36.288 kg (80 lb), SpO2 100.00%. No results found. Results for orders placed during the hospital encounter of 11/29/12 (from the past 72 hour(s))  PROTIME-INR     Status: Abnormal   Collection Time    12/02/12  6:00 AM      Result Value Range   Prothrombin Time 28.7 (*) 11.6 - 15.2 seconds   INR 2.82 (*) 0.00 - 1.49  PROTIME-INR     Status: Abnormal   Collection Time    12/03/12  5:33 AM      Result Value Range   Prothrombin Time 26.0 (*) 11.6 - 15.2 seconds   INR 2.48 (*) 0.00 - 1.49  PROTIME-INR     Status: Abnormal   Collection Time    12/04/12  6:58 AM      Result Value Range   Prothrombin Time 23.3 (*) 11.6 - 15.2 seconds   INR 2.15 (*) 0.00 - 1.49     HEENT: normal Cardio:  RRR and no murmurs Resp: CTA B/L GI: BS positive and non distended Extremity:  Pulses positive Skin:   Intact Neuro: Confused, Cranial Nerve II-XII normal, Abnormal Sensory unable to assess due to dysarthria-severe, Abnormal Motor 4/5 in BUE and BLE and Dysarthric, ataxia L FNF Musc/Skel:  Normal Gen NAD   Assessment/Plan: 1. Functional deficits secondary to Left cerebellar infarct which require 3+ hours per day of interdisciplinary therapy in a comprehensive inpatient rehab setting. Physiatrist is providing close team supervision and 24 hour management of active medical problems listed below. Physiatrist and rehab team continue to assess barriers to discharge/monitor patient progress toward functional and medical goals. FIM: FIM - Bathing Bathing Steps Patient Completed: Chest;Right Arm;Left Arm;Abdomen;Front perineal area;Right upper leg;Left upper leg;Left lower leg (including foot);Right lower leg (including foot) Bathing: 4: Min-Patient completes 8-9 47f 10 parts or 75+ percent  FIM - Upper Body Dressing/Undressing Upper body dressing/undressing steps patient completed: Thread/unthread right sleeve of pullover shirt/dresss;Thread/unthread left sleeve of pullover shirt/dress;Put head through opening of pull over shirt/dress;Pull shirt over trunk Upper body dressing/undressing: 5: Set-up assist to: Obtain clothing/put away FIM - Lower Body Dressing/Undressing Lower body dressing/undressing steps patient completed: Thread/unthread right underwear leg;Thread/unthread left underwear leg;Pull underwear up/down;Thread/unthread right pants leg;Thread/unthread left pants leg;Pull pants up/down;Don/Doff right shoe;Don/Doff left shoe Lower body dressing/undressing: 5: Set-up assist to: Don/Doff TED stocking  FIM - Toileting Toileting steps completed by patient: Adjust clothing after toileting Toileting Assistive Devices: Grab bar or rail for support Toileting: 2: Max-Patient completed  1 of 3  steps  FIM - Diplomatic Services operational officer Devices: Bedside commode Toilet Transfers: 3-To toilet/BSC: Mod A (lift or lower assist);4-From toilet/BSC: Min A (steadying Pt. > 75%)  FIM - Bed/Chair Transfer Bed/Chair Transfer Assistive Devices: Arm rests Bed/Chair Transfer: 3: Bed > Chair or W/C: Mod A (lift or lower assist);3: Chair or W/C > Bed: Mod A (lift or lower assist)  FIM - Locomotion: Wheelchair Distance: 75 (and 50') Locomotion: Wheelchair: 2: Travels 50 - 149 ft with minimal assistance (Pt.>75%) FIM - Locomotion: Ambulation Locomotion: Ambulation Assistive Devices: Other (comment) (R handrail) Ambulation/Gait Assistance: 2: Max assist Locomotion: Ambulation: 0: Activity did not occur  Comprehension Comprehension Mode: Auditory Comprehension: 3-Understands basic 50 - 74% of the time/requires cueing 25 - 50%  of the time  Expression Expression Mode: Verbal Expression: 3-Expresses basic 50 - 74% of the time/requires cueing 25 - 50% of the time. Needs to repeat parts of sentences.  Social Interaction Social Interaction: 4-Interacts appropriately 75 - 89% of the time - Needs redirection for appropriate language or to initiate interaction.  Problem Solving Problem Solving: 2-Solves basic 25 - 49% of the time - needs direction more than half the time to initiate, plan or complete simple activities  Memory Memory: 1-Recognizes or recalls less than 25% of the time/requires cueing greater than 75% of the time   Medical Problem List and Plan:  1. 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 DVT Prophylaxis/Anticoagulation: Coumadin therapy. Monitor for any bleeding episodes. Continue heparin until INR between 2.00 and 3.0  3. Pain Management: Tylenol as needed  4. Dysphagia. Dysphagia 1 nectar liquids. Followup speech therapy. Monitor for any signs of aspiration  5. Neuropsych: This patient is not capable of making decisions on her own behalf.   6. Small non-Q wave myocardial infarction/severe nonischemic cardiomyopathy/apical thrombus. Continue conservative care and maintain Coumadin therapy. Followup cardiology services  7. Hypertension. Altace 2.5 mg daily, Coreg 3.125 mg twice a day. Monitor with increased mobility  8. History of polysubstance abuse. Urine drug screen negative.     LOS (Days) 5 A FACE TO FACE EVALUATION WAS PERFORMED  [name] 12/04/2012, 8:44 AM

## 2012-12-04 NOTE — Progress Notes (Signed)
I have reviewed and agree with the treatment as reflected in this note. Felicity Penix, PT DPT  

## 2012-12-04 NOTE — Progress Notes (Signed)
ANTICOAGULATION CONSULT NOTE - Follow Up Consult  Pharmacy Consult for coumadin Indication: embolic CVA with apical clot  Allergies  Allergen Reactions  . Codeine Itching    All over the body  . Penicillins Hives and Other (See Comments)    "Whelps" per patient.  . Hydralazine     02/28/11 Family unsure if this is allergy for pt    Patient Measurements: Height: 5\' 1"  (154.9 cm) Weight: 80 lb (36.288 kg) IBW/kg (Calculated) : 47.8 Heparin Dosing Weight:   Vital Signs: Temp: 98.2 F (36.8 C) (10/14 0501) Temp src: Oral (10/14 0501) BP: 116/78 mmHg (10/14 0501) Pulse Rate: 44 (10/14 0501)  Labs:  Recent Labs  12/02/12 0600 12/03/12 0533 12/04/12 0658  LABPROT 28.7* 26.0* 23.3*  INR 2.82* 2.48* 2.15*    Estimated Creatinine Clearance: 34.4 ml/min (by C-G formula based on Cr of 0.91).   Medications:  Scheduled:  . antiseptic oral rinse  15 mL Mouth Rinse q12n4p  . carvedilol  3.125 mg Oral BID WC  . feeding supplement (ENSURE)  1 Container Oral TID BM  . multivitamin with minerals  1 tablet Oral Daily  . pantoprazole  40 mg Oral QHS  . ramipril  2.5 mg Oral Daily  . Warfarin - Pharmacist Dosing Inpatient   Does not apply q1800   Infusions:    Assessment: 67 yo female with embolic CVA with apical clot is currently on therapeutic coumadin but INR is down to 2.15.  This is probably due to the 2mg  that she got on 10/12. Goal of Therapy:  INR 2-3 Monitor platelets by anticoagulation protocol: Yes   Plan:  1) Coumadin 3mg  po x1 2) INR in am  Naydeline Morace, [name] 12/04/2012,8:25 AM

## 2012-12-04 NOTE — Progress Notes (Signed)
Speech Language Pathology Daily Session Note  Patient Details  Name: [name] MRN: [id number] Date of Birth: [birth year]  Today's Date: 12/04/2012 Time: 4098-1191 Time Calculation (min): 45 min  Short Term Goals: Week 1: SLP Short Term Goal 1 (Week 1): Pt will consume Dys. 1 textures and nectar thick liquids with minimal overt s/s of aspiration. SLP Short Term Goal 2 (Week 1): Pt will perform oral motor exercises with Max cues. SLP Short Term Goal 3 (Week 1): Pt will utilize speech intelligibility strategies with Max cues. SLP Short Term Goal 4 (Week 1): Pt will utilize external aids to answer orientation questions with Max cues. SLP Short Term Goal 5 (Week 1): Pt will utilize external memory aids with Max cues. SLP Short Term Goal 6 (Week 1): Pt will sustain attention to functional task for 15 min with Max cues.  Skilled Therapeutic Interventions: Treatment focused on swallowing and cognitive goals. Upon SLP arrival, pt verbalized her need to use the bathroom with Mod I. Pt required Min verbal cues for redirection during toileting. SLP facilitated session with skilled observation of breakfast meal consisting of dys. 1 textures and nectar thick liquids. Pt without overt s/s of aspiration, however presenting initially with significantly decreased oral manipulation with no AP transit of bolus (question due to taste, as RN was administering meds crushed in puree). Pt consumed the remainder of her meal with Max cues to utilize compensatory strategies. Continue plan of care.   FIM:  Comprehension Comprehension Mode: Auditory Comprehension: 3-Understands basic 50 - 74% of the time/requires cueing 25 - 50%  of the time Expression Expression Mode: Verbal Expression: 3-Expresses basic 50 - 74% of the time/requires cueing 25 - 50% of the time. Needs to repeat parts of sentences. Social Interaction Social Interaction: 4-Interacts appropriately 75 - 89% of the time - Needs redirection for  appropriate language or to initiate interaction. Problem Solving Problem Solving: 2-Solves basic 25 - 49% of the time - needs direction more than half the time to initiate, plan or complete simple activities Memory Memory: 1-Recognizes or recalls less than 25% of the time/requires cueing greater than 75% of the time FIM - Eating Eating Activity: 5: Needs verbal cues/supervision;5: Set-up assist for open containers  Pain  No/denies pain  Therapy/Group: Individual Therapy  Maxcine Ham 12/04/2012, 1:49 PM  Maxcine Ham, M.A. CCC-SLP [phone]

## 2012-12-04 NOTE — Progress Notes (Signed)
Physical Therapy Session Note  Patient Details  Name: [name] MRN: [id number] Date of Birth: 07-24-1945  Today's Date: 12/04/2012 Time: 1330-1430 Time Calculation (min): 60 min  Short Term Goals: Week 1:  PT Short Term Goal 1 (Week 1): Patient will perform bed<>wheelchair transfers minA.  PT Short Term Goal 2 (Week 1): Patient will negotiate 1 step LRAD, maxA.  PT Short Term Goal 3 (Week 1): Patient will propel wheelchair 25' minA.   Skilled Therapeutic Interventions/Progress Updates:    Patient received seated in wheelchair, eating lunch with OT and sister present. Session focused on wheelchair mobility, functional transfers, activity tolerance, and L LE NMR. See below for transfer and wheelchair mobility details. Discussion with patients sister regarding prior functional level, home set up and home assistance. Sister reports that prior to this stroke patient was able to able to care for herself with no assistance, except for with laundry/cleaning the home and her cognition was not as impaired. Patients home has 1 step to enter (onto a porch) but there is a ramp onto porch, and there are no stairs inside the home. Patient's sister also reports that her daughter (patients niece) will be able to provide the appropriate level and amount of care required by the patient at discharge. Patient instructed in tall kneeling activity on elevated mat table x 2 trials to facilitate increased weight bearing through L LE. First trial patient unable to bring L LE onto table, stated "I can't do it" and returned to wheelchair. Second trial therapist manually facilitated placement of L knee on table, patient reported she had pain in her knee and requested to sit back down. Patient instructed in L LE NMR on NuStep 3x1 minute trials. Patient required assistance placing feet on foot plates, L LE strapped onto plate due to lack of weight bearing through L LE. Visual demonstration provided. Verbal cues for hand  placement, sequencing, and increased use L LE. Manual facilitation for initiation of stepping sequence, patient able to maintain movement and proper sequence one activity initiated. Patient returned to room, seated in wheelchair with seatbelt in place, sister present. Sister verbalized understanding that when she left for the afternoon, she would notify patients nurse, so patient would not be alone in room. Nurse tech notified that patient would like to finish her lunch.  Therapy Documentation Precautions:  Precautions Precautions: Fall Precaution Comments: severe dysarthria Restrictions Weight Bearing Restrictions: No   Pain: Pain Assessment Pain Assessment: No/denies pain Pain Score: 0-No pain Mobility: Transfers Transfers: Yes Sit to Stand: 4: Min assist Sit to Stand Details: Verbal cues for technique;Verbal cues for precautions/safety Sit to Stand Details (indicate cue type and reason): Patient instructed sit>stand x2 trials with bench seat for B UE support. Patient maintained first stand <10secs and second stand 30secs. Verbal cues for hand placement.  Stand to Sit: 4: Min assist Stand to Sit Details (indicate cue type and reason): Tactile cues for placement;Verbal cues for technique;Verbal cues for precautions/safety Stand to Sit Details: Patient stand>sit x2 with verbal/tactile cues for hand placement on arm rests.  Stand Pivot Transfers: 4: Min assist Stand Pivot Transfer Details: Tactile cues for weight shifting;Tactile cues for placement;Tactile cues for posture;Verbal cues for sequencing;Verbal cues for technique;Verbal cues for precautions/safety Stand Pivot Transfer Details (indicate cue type and reason): Patient stand pivot transfers wheelchair<>mat table x1 and wheelchair<>nuStep x1 minA. No weight bearing through L LE (no WB restrictions), patient pivots with R LE. Verbal/tactile cues for hand placement, pivot steps with R LE, and  for turning completing with back of knees to  chair prior to sitting.   Locomotion : Ambulation Ambulation: No Stairs / Additional Locomotion Stairs: No Corporate treasurer: Yes Wheelchair Assistance: 4: Administrator, sports Details: Verbal cues for sequencing;Verbal cues for technique;Verbal cues for precautions/safety;Verbal cues for safe use of DME/AE Wheelchair Propulsion: Both upper extremities;Right lower extremity Wheelchair Parts Management: Needs assistance Distance: 2'   See FIM for current functional status  Therapy/Group: Individual Therapy  Doralee Albino 12/04/2012, 4:28 PM

## 2012-12-05 ENCOUNTER — Inpatient Hospital Stay (HOSPITAL_COMMUNITY): Payer: Medicare Other

## 2012-12-05 ENCOUNTER — Ambulatory Visit (HOSPITAL_COMMUNITY): Payer: Medicare Other

## 2012-12-05 ENCOUNTER — Inpatient Hospital Stay (HOSPITAL_COMMUNITY): Payer: Medicare Other | Admitting: *Deleted

## 2012-12-05 LAB — PROTIME-INR
INR: 2.22 — ABNORMAL HIGH (ref 0.00–1.49)
Prothrombin Time: 23.9 seconds — ABNORMAL HIGH (ref 11.6–15.2)

## 2012-12-05 MED ORDER — QUETIAPINE FUMARATE 25 MG PO TABS
25.0000 mg | ORAL_TABLET | Freq: Every day | ORAL | Status: DC
  Administered 2012-12-05 – 2012-12-11 (×7): 25 mg via ORAL
  Filled 2012-12-05 (×8): qty 1

## 2012-12-05 MED ORDER — WARFARIN SODIUM 3 MG PO TABS
3.0000 mg | ORAL_TABLET | Freq: Once | ORAL | Status: AC
  Administered 2012-12-05: 3 mg via ORAL
  Filled 2012-12-05: qty 1

## 2012-12-05 MED ORDER — CITALOPRAM HYDROBROMIDE 10 MG PO TABS
10.0000 mg | ORAL_TABLET | Freq: Every day | ORAL | Status: DC
  Administered 2012-12-05 – 2012-12-12 (×8): 10 mg via ORAL
  Filled 2012-12-05 (×9): qty 1

## 2012-12-05 NOTE — Progress Notes (Signed)
ANTICOAGULATION CONSULT NOTE - Follow Up Consult  Pharmacy Consult for coumadin Indication: cardioembolic CVA with apical clot  Allergies  Allergen Reactions  . Codeine Itching    All over the body  . Penicillins Hives and Other (See Comments)    "Whelps" per patient.  . Hydralazine     02/28/11 Family unsure if this is allergy for pt    Patient Measurements: Height: 5\' 1"  (154.9 cm) Weight: 80 lb (36.288 kg) IBW/kg (Calculated) : 47.8 Heparin Dosing Weight:   Vital Signs: Temp: 97.6 F (36.4 C) (10/15 0653) Temp src: Oral (10/15 0653) BP: 103/71 mmHg (10/15 0653) Pulse Rate: 53 (10/15 0653)  Labs:  Recent Labs  12/03/12 0533 12/04/12 0658 12/05/12 0555  LABPROT 26.0* 23.3* 23.9*  INR 2.48* 2.15* 2.22*    Estimated Creatinine Clearance: 34.4 ml/min (by C-G formula based on Cr of 0.91).   Medications:  Scheduled:  . antiseptic oral rinse  15 mL Mouth Rinse q12n4p  . carvedilol  3.125 mg Oral BID WC  . feeding supplement (ENSURE)  1 Container Oral TID BM  . multivitamin with minerals  1 tablet Oral Daily  . pantoprazole  40 mg Oral QHS  . ramipril  2.5 mg Oral Daily  . Warfarin - Pharmacist Dosing Inpatient   Does not apply q1800   Infusions:    Assessment: 67 yo female with cardioembolic CVA with apical clot is currently on therapeutic coumadin.  INR up a little to 2.22. Goal of Therapy:  INR 2-3 Monitor platelets by anticoagulation protocol: Yes   Plan:  1) Coumadin 3mg  po x1 2) INR in am  [name], [name] 12/05/2012,8:35 AM

## 2012-12-05 NOTE — Progress Notes (Signed)
Social Work Patient ID: [name], female   DOB: [birth year], 67 y.o.   MRN: [id number] Met with pt and spoke with Rhonda-niece to inform team conference goals-supervision/min level and discharge 10/22. Have arranged family education for Tuesday at 10;00 for niece and pt's sister.  Niece is aware pt will need 24 hour hands on care at discharge.She reports: " I Am right there with her, I miss her."  Pt is calmer today and her speech is clearer.  Will continue to work toward discharge.

## 2012-12-05 NOTE — Patient Care Conference (Signed)
Inpatient RehabilitationTeam Conference and Plan of Care Update Date: 12/05/2012   Time: 11:00 AM    Patient Name: [name]      Medical Record Number: [id number]  Date of Birth: 10-May-1945 Sex: Female         Room/Bed: 4W15C/4W15C-01 Payor Info: Payor: MEDICARE / Plan: MEDICARE PART A AND B / Product Type: *No Product type* /    Admitting Diagnosis: CVA NIH 8   Admit Date/Time:  11/29/2012  4:11 PM Admission Comments: No comment available   Primary Diagnosis:  Embolic cerebral infarction Principal Problem: Embolic cerebral infarction  Patient Active Problem List   Diagnosis Date Noted  . Embolic cerebral infarction 11/29/2012  . Protein-calorie malnutrition, severe 11/27/2012  . Stroke 11/24/2012  . Acute ischemic stroke 06/24/2012  . CVA (cerebral infarction) 10/11/2011  . Chest pain at rest 04/08/2011    Class: Acute  . Hypertension   . Arthralgia   . Breast cancer, stage 2 02/10/2011  . Trouble swallowing   . Breast cancer 01/28/2011  . CHF 12/25/2009  . Cerebral embolism with cerebral infarction 05/26/2009  . 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 LIVER FUNCTION TESTS, ABNORMAL, HX OF 05/14/2009  . CONSTIPATION 04/13/2009  . HYPERLIPIDEMIA 04/06/2009  . SCHIZOPHRENIA 04/06/2009  . ANXIETY 04/06/2009  . COCAINE ABUSE 04/06/2009  . HYPERTENSION, BENIGN ESSENTIAL 04/06/2009  . MYOCARDIAL INFARCTION 04/06/2009  . ASTHMA 04/06/2009  . COPD 04/06/2009  . CHOLELITHIASIS 04/06/2009  . RENAL FAILURE, ACUTE 04/06/2009  . CAD 03/31/2009    Expected Discharge Date: Expected Discharge Date: 12/12/12  Team Members Present: Physician leading conference: Dr. Claudette Laws Social Worker Present: Staci Acosta, LCSW;Becky Johncarlo Maalouf, LCSW Nurse Present: Other (comment) (Whitney Reardon-RN) PT Present: Bridgett Ripa, Lillie Columbia, PT OT Present: Scherrie November, OT;Kris Jacklynn Lewis, OT SLP Present: Maxcine Ham, SLP PPS Coordinator present : Edson Snowball, Chapman Fitch, RN, CRRN     Current Status/Progress Goal Weekly Team Focus  Medical   Cognitive deficits, increased tone on the left side, dysarthria, question aphasia, anxiety  Maximize functional recovery as well as improvements in speech  Managed tone on left side, manage anxiety   Bowel/Bladder   Continent/Incontinent of B&B; LBM 12/03/12  regain urinary function; timed toileting  timed toileting    Swallow/Nutrition/ Hydration   Dys. 1 textures and necter thick liquids, Max cues for swallow strategies  least restrictive PO  utilization of safe swallowing strategies, diet tolerance   ADL's   min assist transfers to R and mod assist transfer to L. Requires min-mod assist standing balance during ADLs. Min-setup assist for UB dressing  supervision overall   functional transfers, standing balance, cognitive skills, coordination, safety awareness, activity tolerance    Mobility   supervision bed mobility, min-modA stand pivot transfers, supervision-minA wheelchair mobility, +2 total A gait (6') and 1 step.  supervision for bed moiblity, transfers, and wheelchair mobility. No gait or stair goals set.   bed mobility, transfers, wheelchair mobility, coordination, balance, activity tolerance, safety, attention/awareness   Communication   Max A oral motor exercises and speech strategies  Min cues  utilization of strategies, oral motor exercises   Safety/Cognition/ Behavioral Observations  Max cues for sustained attention, basic problem solving  Min cues  sustained attention, basic problem solving, orientation   Pain   pt denies pain  pain < 3  monitor and assess pain Qshift and prn   Skin   Healed Stage II to sacrum, allevyn dressing in place  no new further skin  breakdown/infection  monitor and assess skin qshift and prn      *See Care Plan and progress notes for long and short-term goals.  Barriers to Discharge: Patient at a lower functional level than premorbid,    Possible  Resolutions to Barriers:  Instruct caregiver, family teaching    Discharge Planning/Teaching Needs:  Home with niece who provides 24 hr care according to niece.  She will come in for family education when needed      Team Discussion:  Pt speech is improving-better able to understand.  MD adjusting meds for sleep at night. Not safe alone in room must be at [employer].  Will need family education soon  Revisions to Treatment Plan:  None   Continued Need for Acute Rehabilitation Level of Care: The patient requires daily medical management by a physician with specialized training in physical medicine and rehabilitation for the following conditions: Daily direction of a multidisciplinary physical rehabilitation program to ensure safe treatment while eliciting the highest outcome that is of practical value to the patient.: Yes Daily medical management of patient stability for increased activity during participation in an intensive rehabilitation regime.: Yes Daily analysis of laboratory values and/or radiology reports with any subsequent need for medication adjustment of medical intervention for : Neurological problems;Other  [place], Lemar Livings 12/05/2012, 3:20 PM

## 2012-12-05 NOTE — Progress Notes (Signed)
Occupational Therapy Session Note  Patient Details  Name: [name] MRN: [id number] Date of Birth: 1945/10/31  Today's Date: 12/05/2012 Time: 0930-1025 and 1330-1400 Time Calculation (min): 55 min and 30 min  Short Term Goals: Week 1:  OT Short Term Goal 1 (Week 1): Pt will wash 9/10 body parts with min verbal cues and steadying assist  OT Short Term Goal 2 (Week 1): Pt will complete stand pivot toilet transfer with min assist and weight bearing through LLE OT Short Term Goal 3 (Week 1): Pt will complete toilet task with min assist   Skilled Therapeutic Interventions/Progress Updates:    Session 1: Therapy session focused on functional transfers, ADL retraining, and cognitive skills. Pt received at nurses station stating "I'm cold." Completed stand pivot transfer w/c<>TTB in walk-in shower with min assist and verbal cues for technique. Pt completed bathing with min verbal cues and steadying assist when standing for peri care. Completed dressing with min assist for UB (fastening bra) and steadying assist for LB. Pt able to orient bra with increased time and required increased time to problem solve threading BLE into pant legs as she was threading into same leg 2x. At end of session pt returned to nursing station.   Session 2: Therapy session focused on tub transfers. Pt completed squat pivot and stand pivots transfers w/c<>TTB x4. Pt unable to follow commands for squat pivot transfer when going to L side. Pt required increased assist with this transfer secondary to NWB through LLE therefore requiring min-mod assist. Pt with increased anxiety this afternoon during transfers. With repetition pt able to complete 2 transfers w/c<>TTB with min assist. Pt with steadying-supervision when completing transfer TTB>w/c to right side. At end of session pt transferred w/c>bed with min assist.   Therapy Documentation Precautions:  Precautions Precautions: Fall Precaution Comments: severe  dysarthria Restrictions Weight Bearing Restrictions: No General:   Vital Signs:   Pain: No c/o pain during therapy sessions.  See FIM for current functional status  Therapy/Group: Individual Therapy  Gevon Markus, Vara Guardian 12/05/2012, 11:08 AM

## 2012-12-05 NOTE — Progress Notes (Signed)
Patient continuously yells out "nurse" while sitting at nurses station and while in bed in her room.  When staff asks her what she needs, she says she wants to go to bed if she is at the nurses station, then when she is at the nurses station she says she wants to go back to bed.  Dani Gobble, RN

## 2012-12-05 NOTE — Progress Notes (Signed)
Speech Language Pathology Daily Session Note  Patient Details  Name: [name] MRN: [id number] Date of Birth: 04-07-1945  Today's Date: 12/05/2012 Time: 0830-0930 Time Calculation (min): 60 min  Short Term Goals: Week 1: SLP Short Term Goal 1 (Week 1): Pt will consume Dys. 1 textures and nectar thick liquids with minimal overt s/s of aspiration. SLP Short Term Goal 2 (Week 1): Pt will perform oral motor exercises with Max cues. SLP Short Term Goal 3 (Week 1): Pt will utilize speech intelligibility strategies with Max cues. SLP Short Term Goal 4 (Week 1): Pt will utilize external aids to answer orientation questions with Max cues. SLP Short Term Goal 5 (Week 1): Pt will utilize external memory aids with Max cues. SLP Short Term Goal 6 (Week 1): Pt will sustain attention to functional task for 15 min with Max cues.  Skilled Therapeutic Interventions: Treatment focused on swallowing and cognitive goals. Pt verbalized her need to use the bathroom with Mod I. SLP facilitated session with skilled observation of breakfast meal consisting of dys. 1 textures and nectar thick liquids with wet vocal quality x1. Cued throat clear was effective at returning voice to baseline quality; SLP suspects it cleared potential penetrates. Pt required Max cues for utilization of safe swallow strategies. Pt sustained attention to breakfast meal with Mod cues for redirection. Continue plan of care.   FIM:  Comprehension Comprehension Mode: Auditory Comprehension: 4-Understands basic 75 - 89% of the time/requires cueing 10 - 24% of the time Expression Expression Mode: Verbal Expression: 3-Expresses basic 50 - 74% of the time/requires cueing 25 - 50% of the time. Needs to repeat parts of sentences. Social Interaction Social Interaction: 4-Interacts appropriately 75 - 89% of the time - Needs redirection for appropriate language or to initiate interaction. Problem Solving Problem Solving: 2-Solves basic 25 -  49% of the time - needs direction more than half the time to initiate, plan or complete simple activities Memory Memory: 1-Recognizes or recalls less than 25% of the time/requires cueing greater than 75% of the time FIM - Eating Eating Activity: 4: Helper checks for pocketed food;5: Set-up assist for open containers;5: Needs verbal cues/supervision  Pain Pain Assessment Pain Assessment: No/denies pain Pain Score: 0-No pain  Therapy/Group: Individual Therapy  Maxcine Ham 12/05/2012, 12:22 PM  Maxcine Ham, M.A. CCC-SLP [phone]

## 2012-12-05 NOTE — Progress Notes (Signed)
Social Work Lucy Chris, LCSW Social Worker Signed  Patient Care Conference Service date: 12/05/2012 3:19 PM  Inpatient RehabilitationTeam Conference and Plan of Care Update Date: 12/05/2012   Time: 11:00 AM     Patient Name: [name]       Medical Record Number: [id number]   Date of Birth: [birth year] Sex: Female         Room/Bed: 4W15C/4W15C-01 Payor Info: Payor: MEDICARE / Plan: MEDICARE PART A AND B / Product Type: *No Product type* /   Admitting Diagnosis: CVA NIH 8   Admit Date/Time:  11/29/2012  4:11 PM Admission Comments: No comment available   Primary Diagnosis:  Embolic cerebral infarction Principal Problem: Embolic cerebral infarction    Patient Active Problem List     Diagnosis  Date Noted   .  Embolic cerebral infarction  40/98/1191   .  Protein-calorie malnutrition, severe  11/27/2012   .  Stroke  11/24/2012   .  Acute ischemic stroke  06/24/2012   .  CVA (cerebral infarction)  10/11/2011   .  Chest pain at rest  04/08/2011       Class: Acute   .  Hypertension     .  Arthralgia     .  Breast cancer, stage 2  02/10/2011   .  Trouble swallowing     .  Breast cancer  01/28/2011   .  CHF  12/25/2009   .  Cerebral embolism with cerebral infarction  05/26/2009   .  Aneurysm of other specified artery  05/26/2009   .  LIVER FUNCTION TESTS, ABNORMAL, HX OF  05/14/2009   .  CONSTIPATION  04/13/2009   .  HYPERLIPIDEMIA  04/06/2009   .  SCHIZOPHRENIA  04/06/2009   .  ANXIETY  04/06/2009   .  COCAINE ABUSE  04/06/2009   .  HYPERTENSION, BENIGN ESSENTIAL  04/06/2009   .  MYOCARDIAL INFARCTION  04/06/2009   .  ASTHMA  04/06/2009   .  COPD  04/06/2009   .  CHOLELITHIASIS  04/06/2009   .  RENAL FAILURE, ACUTE  04/06/2009   .  CAD  03/31/2009     Expected Discharge Date: Expected Discharge Date: 12/12/12  Team Members Present: Physician leading conference: Dr. Claudette Laws Social Worker Present: Staci Acosta, LCSW;Becky Lillah Standre, LCSW Nurse Present:  Other (comment) (Whitney Reardon-RN) PT Present: Bridgett Ripa, Lillie Columbia, PT OT Present: Scherrie November, OT;Kris Jacklynn Lewis, OT SLP Present: Maxcine Ham, SLP PPS Coordinator present : Edson Snowball, Chapman Fitch, RN, CRRN        Current Status/Progress  Goal  Weekly Team Focus   Medical     Cognitive deficits, increased tone on the left side, dysarthria, question aphasia, anxiety  Maximize functional recovery as well as improvements in speech  Managed tone on left side, manage anxiety   Bowel/Bladder     Continent/Incontinent of B&B; LBM 12/03/12  regain urinary function; timed toileting  timed toileting    Swallow/Nutrition/ Hydration     Dys. 1 textures and necter thick liquids, Max cues for swallow strategies  least restrictive PO  utilization of safe swallowing strategies, diet tolerance   ADL's     min assist transfers to R and mod assist transfer to L. Requires min-mod assist standing balance during ADLs. Min-setup assist for UB dressing  supervision overall   functional transfers, standing balance, cognitive skills, coordination, safety awareness, activity tolerance    Mobility     supervision bed mobility, min-modA stand  pivot transfers, supervision-minA wheelchair mobility, +2 total A gait (6') and 1 step.  supervision for bed moiblity, transfers, and wheelchair mobility. No gait or stair goals set.   bed mobility, transfers, wheelchair mobility, coordination, balance, activity tolerance, safety, attention/awareness   Communication     Max A oral motor exercises and speech strategies  Min cues  utilization of strategies, oral motor exercises   Safety/Cognition/ Behavioral Observations    Max cues for sustained attention, basic problem solving  Min cues  sustained attention, basic problem solving, orientation   Pain     pt denies pain  pain < 3  monitor and assess pain Qshift and prn   Skin     Healed Stage II to sacrum, allevyn dressing in place  no new further  skin breakdown/infection  monitor and assess skin qshift and prn     *See Care Plan and progress notes for long and short-term goals.    Barriers to Discharge:  Patient at a lower functional level than premorbid,      Possible Resolutions to Barriers:    Instruct caregiver, family teaching      Discharge Planning/Teaching Needs:    Home with niece who provides 24 hr care according to niece.  She will come in for family education when needed      Team Discussion:    Pt speech is improving-better able to understand.  MD adjusting meds for sleep at night. Not safe alone in room must be at [employer].  Will need family education soon   Revisions to Treatment Plan:    None    Continued Need for Acute Rehabilitation Level of Care: The patient requires daily medical management by a physician with specialized training in physical medicine and rehabilitation for the following conditions: Daily direction of a multidisciplinary physical rehabilitation program to ensure safe treatment while eliciting the highest outcome that is of practical value to the patient.: Yes Daily medical management of patient stability for increased activity during participation in an intensive rehabilitation regime.: Yes Daily analysis of laboratory values and/or radiology reports with any subsequent need for medication adjustment of medical intervention for : Neurological problems;Other  Lucy Chris 12/05/2012, 3:20 PM          Patient ID: [name], female   DOB: August 11, 1945, 67 y.o.   MRN: [id number]

## 2012-12-05 NOTE — Progress Notes (Signed)
Physical Therapy Session Note  Patient Details  Name: [name] MRN: [id number] Date of Birth: 1945/02/27  Today's Date: 12/05/2012 Time: 1330-1415 Time Calculation (min): 45 min  Short Term Goals: Week 1:  PT Short Term Goal 1 (Week 1): Patient will perform bed<>wheelchair transfers minA.  PT Short Term Goal 2 (Week 1): Patient will negotiate 1 step LRAD, maxA.  PT Short Term Goal 3 (Week 1): Patient will propel wheelchair 25' minA.   Skilled Therapeutic Interventions/Progress Updates:    Patient received supine in bed, OT present. Session focused on bed mobility, functional transfers, and wheelchair mobility. Patient supine>sit supervision, patient able to don shoes without assistance, while seated EOB. Bed>wheelchair squat pivot transfer minA (to L side), verbal/tactile cuing for lateral scooting on bed, hand placement, and taking pivot steps with R LE. Patient instructed in wheelchair mobility 46' x 2 minA, with R LE and B UE, verbal cues for hand placement, safety, and use of R LE to help with propulsion and steering. Wheelchair>level mat table squat pivot transfer (L side) minA, with verbal cues for hand placement and pivot steps with R LE. Patient instructed in seated dynamic balance challenge, seated EOB toe-touch support from R LE, patient tossed rings onto peg 3sets of 5 reps with R UE and 1 set 3reps with L UE, patient stated her "left arm hurts" and was unable to continue with L UE. to facilitate increased anterior weight shift and anterior reach with R UE. Patient mat table>wheelchair (R side) squat pivot transfer and wheelchair>mat table (L side) squat pivot transfer  x 2 trials each for a total of 4 transfers, all minA. Verbal/tactile cues provided for hand placement, pivoting with R LE, and turning completely prior to sitting down. Patient required continuous verbal and tactile cueing during transfers, during 2 transfers to mat table, patient attempted to be seated before fully  reaching the mat, required verbal cuing for increased safety awareness. Squat pivot transfer wheelchair>bed (R side) minA, verbal cues for hand placement and safety. Sit>supine supervision level. Ended session patient supine in bed, with all side rails up, bed alarm on and all needs within reach. Nursing notified patient returned to bed.   Therapy Documentation Precautions:  Precautions Precautions: Fall Precaution Comments: severe dysarthria Restrictions Weight Bearing Restrictions: No   Pain: Pain Assessment Pain Assessment: No/denies pain Pain Score: 0-No pain   Locomotion : Wheelchair Mobility Distance: 75   See FIM for current functional status  Therapy/Group: Individual Therapy  Doralee Albino 12/05/2012, 3:39 PM

## 2012-12-05 NOTE — Progress Notes (Signed)
Subjective/Complaints: 67 y.o. 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 Admitted 11/24/2012 with slurred speech. MRI of the brain showed acute infarcts affecting the left cerebellum and right posterior temporal regions. Also noted extensive encephalomalacia related to remote bihemispheric infarction. 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 Echocardiogram with ejection fraction of 15% no wall motion abnormalities and small apical thrombus. Carotid Dopplers with no ICA stenosis. Neurology services consulted. Patient did receive TPA. Findings of elevated troponin/small non-Q-wave myocardial infarction and followup cardiology services  Dysarthria improving but still speech unintelligible Review of Systems - Negative except dysarthria, severe  Objective: Vital Signs: Blood pressure 103/71, pulse 53, temperature 97.6 F (36.4 C), temperature source Oral, resp. rate 18, height 5\' 1"  (1.549 m), weight 36.288 kg (80 lb), SpO2 100.00%. No results found. Results for orders placed during the hospital encounter of 11/29/12 (from the past 72 hour(s))  PROTIME-INR     Status: Abnormal   Collection Time    12/03/12  5:33 AM      Result Value Range   Prothrombin Time 26.0 (*) 11.6 - 15.2 seconds   INR 2.48 (*) 0.00 - 1.49  PROTIME-INR     Status: Abnormal   Collection Time    12/04/12  6:58 AM      Result Value Range   Prothrombin Time 23.3 (*) 11.6 - 15.2 seconds   INR 2.15 (*) 0.00 - 1.49  PROTIME-INR     Status: Abnormal   Collection Time    12/05/12  5:55 AM      Result Value Range   Prothrombin Time 23.9 (*) 11.6 - 15.2 seconds   INR 2.22 (*) 0.00 - 1.49     HEENT: normal Cardio: RRR and no murmurs Resp:  CTA B/L GI: BS positive and non distended Extremity:  Pulses positive Skin:   Intact Neuro: Confused, Cranial Nerve II-XII normal, Abnormal Sensory unable to assess due to dysarthria-severe, Abnormal Motor 4/5 in BUE and BLE and Dysarthric, ataxia L FNF Musc/Skel:  Normal Gen NAD   Assessment/Plan: 1. Functional deficits secondary to Left cerebellar infarct which require 3+ hours per day of interdisciplinary therapy in a comprehensive inpatient rehab setting. Physiatrist is providing close team supervision and 24 hour management of active medical problems listed below. Physiatrist and rehab team continue to assess barriers to discharge/monitor patient progress toward functional and medical goals. FIM: FIM - Bathing Bathing Steps Patient Completed: Chest;Right Arm;Left Arm;Abdomen;Front perineal area;Right upper leg;Left upper leg;Left lower leg (including foot);Right lower leg (including foot);Buttocks Bathing: 4: Steadying assist  FIM - Upper Body Dressing/Undressing Upper body dressing/undressing steps patient completed: Thread/unthread right sleeve of pullover shirt/dresss;Put head through opening of pull over shirt/dress;Pull shirt over trunk;Thread/unthread left sleeve of pullover shirt/dress;Thread/unthread right bra strap;Thread/unthread left bra strap Upper body dressing/undressing: 4: Min-Patient completed 75 plus % of tasks FIM - Lower Body Dressing/Undressing Lower body dressing/undressing steps patient completed: Thread/unthread right underwear leg;Thread/unthread left underwear leg;Pull underwear up/down;Thread/unthread right pants leg;Thread/unthread left pants leg;Pull pants up/down;Don/Doff right shoe;Don/Doff left shoe Lower body dressing/undressing: 4: Steadying Assist  FIM - Toileting Toileting steps completed by patient: Adjust clothing prior to toileting;Performs perineal hygiene Toileting Assistive Devices: Grab bar or rail for support Toileting: 3: Mod-Patient  completed 2 of 3 steps  FIM -  Diplomatic Services operational officer Devices: Therapist, music Transfers: 3-To toilet/BSC: Mod A (lift or lower assist);4-From toilet/BSC: Min A (steadying Pt. > 75%)  FIM - Bed/Chair Transfer Bed/Chair Transfer Assistive Devices: Arm rests Bed/Chair Transfer: 4: Chair or W/C > Bed: Min A (steadying Pt. > 75%);4: Bed > Chair or W/C: Min A (steadying Pt. > 75%)  FIM - Locomotion: Wheelchair Distance: 75' Locomotion: Wheelchair: 2: Travels 50 - 149 ft with minimal assistance (Pt.>75%) FIM - Locomotion: Ambulation Locomotion: Ambulation Assistive Devices: Other (comment) (R handrail) Ambulation/Gait Assistance: 2: Max assist Locomotion: Ambulation: 0: Activity did not occur  Comprehension Comprehension Mode: Auditory Comprehension: 4-Understands basic 75 - 89% of the time/requires cueing 10 - 24% of the time  Expression Expression Mode: Verbal Expression: 3-Expresses basic 50 - 74% of the time/requires cueing 25 - 50% of the time. Needs to repeat parts of sentences.  Social Interaction Social Interaction: 4-Interacts appropriately 75 - 89% of the time - Needs redirection for appropriate language or to initiate interaction.  Problem Solving Problem Solving: 2-Solves basic 25 - 49% of the time - needs direction more than half the time to initiate, plan or complete simple activities  Memory Memory: 1-Recognizes or recalls less than 25% of the time/requires cueing greater than 75% of the time   Medical Problem List and Plan:  1. 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 DVT Prophylaxis/Anticoagulation: Coumadin therapy. Monitor for any bleeding episodes. Continue heparin until INR between 2.00 and 3.0  3. Pain Management: Tylenol as needed  4. Dysphagia. Dysphagia 1 nectar liquids. Followup speech therapy. Monitor for any signs of aspiration  5. Neuropsych: This patient is not capable of making decisions on her own behalf.  6.  Small non-Q wave myocardial infarction/severe nonischemic cardiomyopathy/apical thrombus. Continue conservative care and maintain Coumadin therapy. Followup cardiology services  7. Hypertension. Altace 2.5 mg daily, Coreg 3.125 mg twice a day. Monitor with increased mobility  8. History of polysubstance abuse. Urine drug screen negative.     LOS (Days) 6 A FACE TO FACE EVALUATION WAS PERFORMED  Edla Para E 12/05/2012, 1:25 PM

## 2012-12-06 ENCOUNTER — Inpatient Hospital Stay (HOSPITAL_COMMUNITY): Payer: Medicare Other

## 2012-12-06 ENCOUNTER — Inpatient Hospital Stay (HOSPITAL_COMMUNITY): Payer: Medicare Other | Admitting: *Deleted

## 2012-12-06 ENCOUNTER — Ambulatory Visit (HOSPITAL_COMMUNITY): Payer: Medicare Other

## 2012-12-06 ENCOUNTER — Inpatient Hospital Stay (HOSPITAL_COMMUNITY): Payer: Medicare Other | Admitting: Occupational Therapy

## 2012-12-06 MED ORDER — WARFARIN SODIUM 3 MG PO TABS
3.0000 mg | ORAL_TABLET | Freq: Once | ORAL | Status: AC
  Administered 2012-12-06: 3 mg via ORAL
  Filled 2012-12-06: qty 1

## 2012-12-06 NOTE — Progress Notes (Signed)
Physical Therapy Session Note  Patient Details  Name: [name] MRN: [id number] Date of Birth: [birth year]  Today's Date: 12/06/2012 Time: 1100-1145 Time Calculation (min): 45 min  Short Term Goals: Week 1:  PT Short Term Goal 1 (Week 1): Patient will perform bed<>wheelchair transfers minA.  PT Short Term Goal 2 (Week 1): Patient will negotiate 1 step LRAD, maxA.  PT Short Term Goal 3 (Week 1): Patient will propel wheelchair 25' minA.   Skilled Therapeutic Interventions/Progress Updates:    Patient received seated in wheelchair at nurses station. Session focused on wheelchair mobility, functional transfers, gait training and stair negotiation. See details below. Patient returned to room, stand pivot transfer wheelchair>bed minA, sit>supine in bed supervision, with verbal cues for using B LE to boost up to [facility]. Ended session patient supine in bed, 4 side rails up, bed alarm on and nurse tech notified patient returned to bed in room.   Therapy Documentation Precautions:  Precautions Precautions: Fall Precaution Comments: severe dysarthria Restrictions Weight Bearing Restrictions: No   Pain: Pain Assessment Pain Assessment: No/denies pain Pain Score: 0-No pain Locomotion : Ambulation Ambulation: Yes Ambulation/Gait Assistance: 4: Min assist Ambulation Distance (Feet): 50 Feet (and 10'x2) Assistive device: Rolling walker Ambulation/Gait Assistance Details: Verbal cues for sequencing;Verbal cues for technique;Verbal cues for precautions/safety;Verbal cues for safe use of DME/AE Ambulation/Gait Assistance Details: Patient instructed in gait training 10'x2 RW minA and 50'x1 RW minA with verbal/visual cues for initiation, maintaining BOS inside RW, and for maintaining contact of RW with ground and not to lift walker off ground to move it. Patient demonstrated increased weight bearing through L LE, resembling TTWB type gait pattern.  Stairs / Additional Locomotion Stairs:  Yes Stairs Assistance: 3: Mod assist Stairs Assistance Details: Verbal cues for sequencing;Verbal cues for technique;Verbal cues for precautions/safety;Verbal cues for gait pattern;Tactile cues for placement;Manual facilitation for placement Stairs Assistance Details (indicate cue type and reason): Patient instructed in stair negotiation 3steps modA with B UE support. Patient demosntrated step to gait pattern, ascending/desceinding stairs leading with R LE. Patient required verbal cues and manual facilitation for hand placement on railings and required assistance with hand placement when turning at top of stairs. Patient stand pivot transfer into wheelchair at bottom of stairs minA.  Stair Management Technique: Two Psychologist, sport and exercise: Yes Wheelchair Assistance: 4: Administrator, sports Details: Verbal cues for sequencing;Verbal cues for technique;Verbal cues for Engineer, drilling: Both upper extremities Wheelchair Parts Management: Needs assistance Distance: 50   See FIM for current functional status  Therapy/Group: Individual Therapy  Doralee Albino 12/06/2012, 12:21 PM

## 2012-12-06 NOTE — Progress Notes (Signed)
ANTICOAGULATION CONSULT NOTE - Follow Up Consult  Pharmacy Consult for coumadin Indication: cardioembolic CVA with apical clot  Allergies  Allergen Reactions  . Codeine Itching    All over the body  . Penicillins Hives and Other (See Comments)    "Whelps" per patient.  . Hydralazine     02/28/11 Family unsure if this is allergy for pt    Patient Measurements: Height: 5\' 1"  (154.9 cm) Weight: 85 lb (38.556 kg) IBW/kg (Calculated) : 47.8   Vital Signs: Temp: 98.4 F (36.9 C) (10/16 0615) Temp src: Oral (10/16 0615) BP: 122/79 mmHg (10/16 0615) Pulse Rate: 68 (10/16 0615)  Labs:  Recent Labs  12/04/12 0658 12/05/12 0555 12/06/12 0500  LABPROT 23.3* 23.9* 25.5*  INR 2.15* 2.22* 2.42*    Estimated Creatinine Clearance: 36.6 ml/min (by C-G formula based on Cr of 0.91).   Medications:  Scheduled:  . antiseptic oral rinse  15 mL Mouth Rinse q12n4p  . carvedilol  3.125 mg Oral BID WC  . citalopram  10 mg Oral Daily  . feeding supplement (ENSURE)  1 Container Oral TID BM  . multivitamin with minerals  1 tablet Oral Daily  . pantoprazole  40 mg Oral QHS  . QUEtiapine  25 mg Oral QHS  . ramipril  2.5 mg Oral Daily  . Warfarin - Pharmacist Dosing Inpatient   Does not apply q1800   Infusions:    Assessment: 67 yo female with cardioembolic CVA with apical clot is currently on therapeutic coumadin.  INR is 2.42 today, therapeutic.   No bleeding noted.   Goal of Therapy:  INR 2-3 Monitor platelets by anticoagulation protocol: Yes   Plan:  1) Coumadin 3mg  po x1 2) INR in am  Noah Delaine, RPh Clinical Pharmacist Pager: [phone] 12/06/2012,2:57 PM

## 2012-12-06 NOTE — Progress Notes (Signed)
Occupational Therapy Session Note  Patient Details  Name: [name] MRN: [id number] Date of Birth: Nov 08, 1945  Today's Date: 12/06/2012 Time: 1000-1100 Time Calculation (min): 60 min  Short Term Goals: Week 1:  OT Short Term Goal 1 (Week 1): Pt will wash 9/10 body parts with min verbal cues and steadying assist  OT Short Term Goal 2 (Week 1): Pt will complete stand pivot toilet transfer with min assist and weight bearing through LLE OT Short Term Goal 3 (Week 1): Pt will complete toilet task with min assist   Skilled Therapeutic Interventions/Progress Updates:    Therapy session focused functional transfers, standing balance, and following simple commands. Pt weening off O2 and remained above 90% throughout session on 2L O2. Pt required min assist for stand pivot transfers w/c<>toilet and w/c<>TTB. Required min assist for standing balance during bathing and clothing management around waist. Pt continues to not weight bear through LLE however increased awareness on this date as she was stating she couldn't put her foot on the floor during the session. Manually assisted pt to do this to make her aware that she could however pt continued to decline weight bearing. Pt left at nurses station at end of therapy session.   Therapy Documentation Precautions:  Precautions Precautions: Fall Precaution Comments: severe dysarthria Restrictions Weight Bearing Restrictions: No General:   Vital Signs:   Pain: No c/o pain during therapy session.   See FIM for current functional status  Therapy/Group: Individual Therapy  Amiley Shishido, Vara Guardian 12/06/2012, 11:03 AM

## 2012-12-06 NOTE — Progress Notes (Signed)
I have reviewed and agree with the treatment as reflected in this note. Genevie Elman, PT DPT  

## 2012-12-06 NOTE — Progress Notes (Signed)
I have reviewed and agree with the treatment as reflected in this note. Porcha Deblanc, PT DPT  

## 2012-12-06 NOTE — Progress Notes (Signed)
Attempting to wean patient off oxygen.  While patient was sitting at the nurses station, I decreased the oxygen to 1 liter via [place] and monitored the patients oxygen saturation for approximately 2 minutes.  Patients sats were 95-98% on 1 L.  Turned oxygen off completely and monitored patients oxygen saturation for another 5 minutes and patients oxygen saturation was maintained at 95-98%.  Will leave patient off oxygen for now and continue to monitor patients saturation closely. Dani Gobble, RN

## 2012-12-06 NOTE — Progress Notes (Signed)
Occupational Therapy Session Note  Patient Details  Name: [name] MRN: [id number] Date of Birth: 1945/06/22  Today's Date: 12/06/2012 Time: 1425-1500 Time Calculation (min): 35 min  Short Term Goals: Week 1:  OT Short Term Goal 1 (Week 1): Pt will wash 9/10 body parts with min verbal cues and steadying assist  OT Short Term Goal 2 (Week 1): Pt will complete stand pivot toilet transfer with min assist and weight bearing through LLE OT Short Term Goal 3 (Week 1): Pt will complete toilet task with min assist   Skilled Therapeutic Interventions/Progress Updates:  Patient sitting in w/c and eating lunch with NT at her side.   OT provided supervision/vcs for safety during completion of her meal.  Patient requested to go to the bathroom and attempted to get up out of her w/c with assistance and took a few steps as total assist before finally realizing that she needed to back up and sit in w/c and take the w/c into the bathroom for a stand step transfer.  Patient stood at sink to wash hands and adjust her wig.  Patient left a nursing station with O2 and QRB in place.  Therapy Documentation Precautions:  Precautions Precautions: Fall Precaution Comments: severe dysarthria Restrictions Weight Bearing Restrictions: No Pain: Denies pain See FIM for current functional status  Therapy/Group: Individual Therapy  Aleane Wesenberg 12/06/2012, 4:09 PM

## 2012-12-06 NOTE — Progress Notes (Addendum)
Speech Language Pathology Daily Session Note  Patient Details  Name: [name] MRN: [id number] Date of Birth: 04/26/1945  Today's Date: 12/06/2012 Time: 0830-0925 Time Calculation (min): 55 min  Short Term Goals: Week 1: SLP Short Term Goal 1 (Week 1): Pt will consume Dys. 1 textures and nectar thick liquids with minimal overt s/s of aspiration. SLP Short Term Goal 2 (Week 1): Pt will perform oral motor exercises with Max cues. SLP Short Term Goal 3 (Week 1): Pt will utilize speech intelligibility strategies with Max cues. SLP Short Term Goal 4 (Week 1): Pt will utilize external aids to answer orientation questions with Max cues. SLP Short Term Goal 5 (Week 1): Pt will utilize external memory aids with Max cues. SLP Short Term Goal 6 (Week 1): Pt will sustain attention to functional task for 15 min with Max cues.  Skilled Therapeutic Interventions: Treatment focused on swallowing, speech, and cognitive goals. SLP facilitated session with skilled observation of breakfast meal consisting of dys. 1 textures and nectar thick liquids. Pt required Mod cues for smaller bites/sips, however still requires Max cues for clearance of oral residue. Despite Max A while looking in the mirror, pt reported that she did not see the eggs on her tongue and would not use multiple swallows or a lingual sweep to reduce residuals. A liquid was effective at decreasing residue, and pt was more agreeable to this strategy. Overall, she continues to demonstrate oral holding with oral expectoration x1, anterior spillage, lingual residue, and pocketed food. Pt demonstrated basic problem solving during self-feeding task with Min cues, and required Min cues for orientation to date and situation. She performed labial ROM exercises with SLP, but then she refused to participate in any further dysarthria treatment. Pt missed 5 minutes of therapy due to refusal to participate. Continue plan of care.   FIM:     Pain Pain  Assessment Pain Assessment: No/denies pain  Therapy/Group: Individual Therapy  Maxcine Ham 12/06/2012, 12:08 PM  Maxcine Ham, M.A. CCC-SLP [phone]

## 2012-12-06 NOTE — Progress Notes (Signed)
Subjective/Complaints: 67 y.o. 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 Admitted 11/24/2012 with slurred speech. MRI of the brain showed acute infarcts affecting the left cerebellum and right posterior temporal regions. Also noted extensive encephalomalacia related to remote bihemispheric infarction. 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 Echocardiogram with ejection fraction of 15% no wall motion abnormalities and small apical thrombus. Carotid Dopplers with no ICA stenosis. Neurology services consulted. Patient did receive TPA. Findings of elevated troponin/small non-Q-wave myocardial infarction and followup cardiology services  Dysarthria improving but still speech unintelligible No pain c/os Review of Systems - Negative except dysarthria, severe  Objective: Vital Signs: Blood pressure 122/79, pulse 68, temperature 98.4 F (36.9 C), temperature source Oral, resp. rate 18, height 5\' 1"  (1.549 m), weight 38.556 kg (85 lb), SpO2 100.00%. No results found. Results for orders placed during the hospital encounter of 11/29/12 (from the past 72 hour(s))  PROTIME-INR     Status: Abnormal   Collection Time    12/04/12  6:58 AM      Result Value Range   Prothrombin Time 23.3 (*) 11.6 - 15.2 seconds   INR 2.15 (*) 0.00 - 1.49  PROTIME-INR     Status: Abnormal   Collection Time    12/05/12  5:55 AM      Result Value Range   Prothrombin Time 23.9 (*) 11.6 - 15.2 seconds   INR 2.22 (*) 0.00 - 1.49  PROTIME-INR     Status: Abnormal   Collection Time    12/06/12  5:00 AM      Result Value Range   Prothrombin Time 25.5 (*) 11.6 - 15.2 seconds   INR 2.42 (*) 0.00 - 1.49     HEENT: normal Cardio: RRR and no  murmurs Resp: CTA B/L GI: BS positive and non distended Extremity:  Pulses positive Skin:   Intact Neuro: Confused, Cranial Nerve II-XII normal, Abnormal Sensory unable to assess due to dysarthria-severe, Abnormal Motor 4/5 in BUE and BLE and Dysarthric, ataxia L FNF Musc/Skel:  Normal Gen NAD   Assessment/Plan: 1. Functional deficits secondary to Left cerebellar infarct which require 3+ hours per day of interdisciplinary therapy in a comprehensive inpatient rehab setting. Physiatrist is providing close team supervision and 24 hour management of active medical problems listed below. Physiatrist and rehab team continue to assess barriers to discharge/monitor patient progress toward functional and medical goals. FIM: FIM - Bathing Bathing Steps Patient Completed: Chest;Right Arm;Left Arm;Abdomen;Front perineal area;Right upper leg;Left upper leg;Left lower leg (including foot);Right lower leg (including foot);Buttocks Bathing: 4: Steadying assist  FIM - Upper Body Dressing/Undressing Upper body dressing/undressing steps patient completed: Thread/unthread right sleeve of pullover shirt/dresss;Put head through opening of pull over shirt/dress;Pull shirt over trunk;Thread/unthread left sleeve of pullover shirt/dress;Thread/unthread right bra strap;Thread/unthread left bra strap Upper body dressing/undressing: 4: Min-Patient completed 75 plus % of tasks FIM - Lower Body Dressing/Undressing Lower body dressing/undressing steps patient completed: Thread/unthread right underwear leg;Thread/unthread left underwear leg;Pull underwear up/down;Thread/unthread right pants leg;Thread/unthread left pants leg;Pull pants up/down;Don/Doff right shoe;Don/Doff left shoe Lower body dressing/undressing: 4: Steadying Assist  FIM - Toileting Toileting steps completed by patient: Adjust clothing prior to toileting;Performs perineal hygiene Toileting Assistive Devices: Grab bar or rail for support Toileting: 3:  Mod-Patient completed 2 of 3 steps  FIM - Diplomatic Services operational officer Devices: Grab bars Toilet Transfers: 3-To toilet/BSC: Mod A (lift or lower assist);4-From toilet/BSC: Min A (steadying Pt. > 75%)  FIM - Bed/Chair Transfer Bed/Chair Transfer Assistive Devices: Bed rails;Arm rests;HOB elevated Bed/Chair Transfer: 5: Supine > Sit: Supervision (verbal cues/safety issues);5: Sit > Supine: Supervision (verbal cues/safety issues);4: Chair or W/C > Bed: Min A (steadying Pt. > 75%);4: Bed > Chair or W/C: Min A (steadying Pt. > 75%)  FIM - Locomotion: Wheelchair Distance: 75 Locomotion: Wheelchair: 2: Travels 50 - 149 ft with minimal assistance (Pt.>75%) FIM - Locomotion: Ambulation Locomotion: Ambulation Assistive Devices: Other (comment) (R handrail) Ambulation/Gait Assistance: 2: Max assist Locomotion: Ambulation: 0: Activity did not occur  Comprehension Comprehension Mode: Auditory Comprehension: 4-Understands basic 75 - 89% of the time/requires cueing 10 - 24% of the time  Expression Expression Mode: Verbal Expression: 3-Expresses basic 50 - 74% of the time/requires cueing 25 - 50% of the time. Needs to repeat parts of sentences.  Social Interaction Social Interaction: 4-Interacts appropriately 75 - 89% of the time - Needs redirection for appropriate language or to initiate interaction.  Problem Solving Problem Solving: 2-Solves basic 25 - 49% of the time - needs direction more than half the time to initiate, plan or complete simple activities  Memory Memory: 1-Recognizes or recalls less than 25% of the time/requires cueing greater than 75% of the time   Medical Problem List and Plan:  1. 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 DVT Prophylaxis/Anticoagulation: Coumadin therapy. Monitor for any bleeding episodes. 3. Pain Management: Tylenol as needed  4. Dysphagia. Dysphagia 1 nectar liquids. Followup speech therapy. Monitor for any signs of  aspiration  5. Neuropsych: This patient is not capable of making decisions on her own behalf.  6. Small non-Q wave myocardial infarction/severe nonischemic cardiomyopathy/apical thrombus. Continue conservative care and maintain Coumadin therapy. Followup cardiology services  7. Hypertension. Altace 2.5 mg daily, Coreg 3.125 mg twice a day. Monitor with increased mobility  8. History of polysubstance abuse. Urine drug screen negative.     LOS (Days) 7 A FACE TO FACE EVALUATION WAS PERFORMED  KIRSTEINS,ANDREW E 12/06/2012, 8:23 AM

## 2012-12-07 ENCOUNTER — Inpatient Hospital Stay (HOSPITAL_COMMUNITY): Payer: Medicare Other

## 2012-12-07 ENCOUNTER — Inpatient Hospital Stay (HOSPITAL_COMMUNITY): Payer: Medicare Other | Admitting: *Deleted

## 2012-12-07 DIAGNOSIS — G8110 Spastic hemiplegia affecting unspecified side: Secondary | ICD-10-CM

## 2012-12-07 DIAGNOSIS — I6340 Cerebral infarction due to embolism of unspecified cerebral artery: Secondary | ICD-10-CM

## 2012-12-07 DIAGNOSIS — I69921 Dysphasia following unspecified cerebrovascular disease: Secondary | ICD-10-CM

## 2012-12-07 MED ORDER — WARFARIN SODIUM 3 MG PO TABS
3.0000 mg | ORAL_TABLET | Freq: Every day | ORAL | Status: DC
  Administered 2012-12-07: 3 mg via ORAL
  Filled 2012-12-07 (×2): qty 1

## 2012-12-07 NOTE — Plan of Care (Signed)
Problem: RH Toileting Goal: LTG Patient will perform toileting w/assist, cues/equip (OT) LTG: Patient will perform toiletiing (clothes management/hygiene) with assist, with/without cues using equipment (OT)  Downgraded to min assist for standing balance.

## 2012-12-07 NOTE — Progress Notes (Signed)
Subjective/Complaints: 67 y.o. 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 Admitted 11/24/2012 with slurred speech. MRI of the brain showed acute infarcts affecting the left cerebellum and right posterior temporal regions. Also noted extensive encephalomalacia related to remote bihemispheric infarction. 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 Echocardiogram with ejection fraction of 15% no wall motion abnormalities and small apical thrombus. Carotid Dopplers with no ICA stenosis. Neurology services consulted. Patient did receive TPA. Findings of elevated troponin/small non-Q-wave myocardial infarction and followup cardiology services  Dysarthria improving but still speech unintelligible No pain c/os Review of Systems - Negative except dysarthria, severe  Objective: Vital Signs: Blood pressure 125/83, pulse 60, temperature 98.5 F (36.9 C), temperature source Oral, resp. rate 20, height 5\' 1"  (1.549 m), weight 38.556 kg (85 lb), SpO2 97.00%. No results found. Results for orders placed during the hospital encounter of 11/29/12 (from the past 72 hour(s))  PROTIME-INR     Status: Abnormal   Collection Time    12/05/12  5:55 AM      Result Value Range   Prothrombin Time 23.9 (*) 11.6 - 15.2 seconds   INR 2.22 (*) 0.00 - 1.49  PROTIME-INR     Status: Abnormal   Collection Time    12/06/12  5:00 AM      Result Value Range   Prothrombin Time 25.5 (*) 11.6 - 15.2 seconds   INR 2.42 (*) 0.00 - 1.49  PROTIME-INR     Status: Abnormal   Collection Time    12/07/12  6:30 AM      Result Value Range   Prothrombin Time 25.0 (*) 11.6 - 15.2 seconds   INR 2.36 (*) 0.00 - 1.49     HEENT: normal Cardio: RRR and no  murmurs Resp: CTA B/L GI: BS positive and non distended Extremity:  Pulses positive Skin:   Intact Neuro: Confused, Cranial Nerve II-XII normal, Abnormal Sensory unable to assess due to dysarthria-severe, Abnormal Motor 4/5 in BUE and BLE and Dysarthric, ataxia L FNF Musc/Skel:  Normal Gen NAD   Assessment/Plan: 1. Functional deficits secondary to Left cerebellar infarct which require 3+ hours per day of interdisciplinary therapy in a comprehensive inpatient rehab setting. Physiatrist is providing close team supervision and 24 hour management of active medical problems listed below. Physiatrist and rehab team continue to assess barriers to discharge/monitor patient progress toward functional and medical goals. FIM: FIM - Bathing Bathing Steps Patient Completed: Chest;Right Arm;Left Arm;Abdomen;Front perineal area;Right upper leg;Left upper leg;Left lower leg (including foot);Right lower leg (including foot);Buttocks Bathing: 4: Steadying assist  FIM - Upper Body Dressing/Undressing Upper body dressing/undressing steps patient completed: Thread/unthread right sleeve of pullover shirt/dresss;Put head through opening of pull over shirt/dress;Pull shirt over trunk;Thread/unthread left sleeve of pullover shirt/dress;Thread/unthread right bra strap;Thread/unthread left bra strap Upper body dressing/undressing: 4: Min-Patient completed 75 plus % of tasks FIM - Lower Body Dressing/Undressing Lower body dressing/undressing steps patient completed: Thread/unthread right underwear leg;Thread/unthread left underwear leg;Pull underwear up/down;Thread/unthread right pants leg;Thread/unthread left pants leg;Pull pants up/down;Don/Doff right shoe;Don/Doff left shoe Lower body dressing/undressing: 4: Steadying Assist  FIM - Toileting Toileting steps completed by patient: Adjust clothing prior to toileting;Performs perineal hygiene;Adjust clothing after toileting Toileting Assistive Devices: Grab bar or  rail for support Toileting: 4: Steadying assist  FIM - Diplomatic Services operational officer Devices: Grab bars Toilet Transfers: 4-To toilet/BSC: Min A (steadying Pt. > 75%);4-From toilet/BSC: Min A (steadying Pt. > 75%)  FIM - Bed/Chair Transfer Bed/Chair Transfer Assistive Devices: Arm rests;Bed rails Bed/Chair Transfer: 5: Sit > Supine: Supervision (verbal cues/safety issues);4: Bed > Chair or W/C: Min A (steadying Pt. > 75%);4: Chair or W/C > Bed: Min A (steadying Pt. > 75%)  FIM - Locomotion: Wheelchair Distance: 50 Locomotion: Wheelchair: 2: Travels 50 - 149 ft with minimal assistance (Pt.>75%) FIM - Locomotion: Ambulation Locomotion: Ambulation Assistive Devices: Designer, industrial/product Ambulation/Gait Assistance: 4: Min assist Locomotion: Ambulation: 1: Travels less than 50 ft with minimal assistance (Pt.>75%)  Comprehension Comprehension Mode: Auditory Comprehension: 4-Understands basic 75 - 89% of the time/requires cueing 10 - 24% of the time  Expression Expression Mode: Verbal Expression: 3-Expresses basic 50 - 74% of the time/requires cueing 25 - 50% of the time. Needs to repeat parts of sentences.  Social Interaction Social Interaction: 4-Interacts appropriately 75 - 89% of the time - Needs redirection for appropriate language or to initiate interaction.  Problem Solving Problem Solving: 2-Solves basic 25 - 49% of the time - needs direction more than half the time to initiate, plan or complete simple activities  Memory Memory: 1-Recognizes or recalls less than 25% of the time/requires cueing greater than 75% of the time   Medical Problem List and Plan:  1. 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 DVT Prophylaxis/Anticoagulation: Coumadin therapy. Monitor for any bleeding episodes. 3. Pain Management: Tylenol as needed  4. Dysphagia. Dysphagia 1 nectar liquids. Followup speech therapy. Monitor for any signs of aspiration  5. Neuropsych: This  patient is not capable of making decisions on her own behalf.  6. Small non-Q wave myocardial infarction/severe nonischemic cardiomyopathy/apical thrombus. Continue conservative care and maintain Coumadin therapy. Followup cardiology services  7. Hypertension. Altace 2.5 mg daily, Coreg 3.125 mg twice a day. Monitor with increased mobility  8. History of polysubstance abuse. Urine drug screen negative.     LOS (Days) 8 A FACE TO FACE EVALUATION WAS PERFORMED  Harvir Patry E 12/07/2012, 8:00 AM

## 2012-12-07 NOTE — Progress Notes (Signed)
Physical Therapy Weekly Progress Note  Patient Details  Name: [name] MRN: [id number] Date of Birth: [birth year]  Today's Date: 12/07/2012 Time: 1100-1145 Time Calculation (min): 45 min  Patient has met 3 of 3 short term goals.  Patient is currently progressing towards all LTGS. Patient is currently minA for bed<>wheelchair transfers, supervision/minA for wheelchair mobility 75', modA for stair negotiation with 2 handrails 3 steps, and supervision/minA for gait 60' RW. Patients cognitive impairments continue to be a barrier to patients functional improvements in therapy.   Patient continues to demonstrate the following deficits: activity tolerance, functional use of L LE, balance, coordination, safety, attention/awareness, cognition and therefore will continue to benefit from skilled PT intervention to enhance overall performance with activity tolerance, balance, postural control, ability to compensate for deficits, functional use of  left lower extremity, attention and coordination.  Patients dynamic standing balance goal being upgraded to supervision level, transfers being downgraded to minA, and ambulation goal added. .  Plan of care revisions: See LTGs.  PT Short Term Goals Week 1:  PT Short Term Goal 1 (Week 1): Patient will perform bed<>wheelchair transfers minA.  PT Short Term Goal 1 - Progress (Week 1): Met PT Short Term Goal 2 (Week 1): Patient will negotiate 1 step LRAD, maxA.  PT Short Term Goal 2 - Progress (Week 1): Met PT Short Term Goal 3 (Week 1): Patient will propel wheelchair 25' minA.  PT Short Term Goal 3 - Progress (Week 1): Met PT Short Term Goal 1 (Week 2): STGs=LTGs  Skilled Therapeutic Interventions/Progress Updates:    Patient received seated in wheelchair at nurses station. Session focused on wheelchair mobility, gait training, static standing challenges, and functional transfers. See below for wheelchair and gait. Patient on 2.0L O2 via [place], O2 removed  patients sats 86%, difficult to get multiple accurate readings, attempted several times. Patient O2 placed back on at 1.0L via [place] for therapy session. Patient sit<>stand from wheelchair with RW, supervision level, verbal cues and manual facilitation for placement of hands on armrests when sit>stand. Patient instructed in static standing challenge with RW, to assemble "pipe tree" puzzle, for activity tolerance in standing, L LE NMR, fine motor control, problem solving, ability to follow simple commands, and for sequencing when assembling the puzzle. Patient able to stand supervision level with alternating UE support, at time B UE support and R or L UE support depending on the task. Patient was able to assemble "goal post" puzzle with maxA with management of pieces and maintained standing supervision level. Verbal cues provided for proper placement of pieces and for use of both UEs to assist assembly of pieces. Ended session patient seated in wheelchair at nurse's station, seatbelt donned and O2 returned to 2.0L via [place], per nursing request.  Therapy Documentation Precautions:  Precautions Precautions: Fall Precaution Comments: severe dysarthria Restrictions Weight Bearing Restrictions: No   Locomotion : Ambulation Ambulation: Yes Ambulation/Gait Assistance: 4: Min A Ambulation Distance (Feet): 60 Feet Assistive device: Rolling walker Ambulation/Gait Assistance Details: Verbal cues for technique;Verbal cues for precautions/safety;Verbal cues for safe use of DME/AE Ambulation/Gait Assistance Details: Patient instructed in gait training 60'x1 RW minA in controlled environment with step-to gait pattern, demonstrates L forefoot contact initially and significantly decreased stance time on L LE. Verbal cues for maintaining BOS inside RW, maintaining contact of RW to floor and not to lift it off floor to move it, and for safety awareness.  Corporate treasurer: Yes Wheelchair Assistance:  5: Financial planner Details: Verbal  cues for technique;Verbal cues for safe use of DME/AE;Verbal cues for precautions/safety Wheelchair Propulsion: Both upper extremities;Both lower extermities Wheelchair Parts Management: Needs assistance Distance: 75     Balance: Balance Balance Assessed: Yes Static Standing Balance Static Standing - Balance Support: Bilateral upper extremity supported;During functional activity Static Standing - Level of Assistance: 5: Stand by assistance Static Standing - Comment/# of Minutes: 7 Dynamic Standing Balance Dynamic Standing - Comments:    See FIM for current functional status  Therapy/Group: Individual Therapy  Doralee Albino 12/07/2012, 3:54 PM

## 2012-12-07 NOTE — Plan of Care (Signed)
Problem: RH Bed to Chair Transfers Goal: LTG Patient will perform bed/chair transfers w/assist (PT) LTG: Patient will perform bed/chair transfers with assistance, with/without cues (PT).  Goal downgraded 12/07/12, secondary to poor safety awareness

## 2012-12-07 NOTE — Progress Notes (Signed)
I have reviewed and agree with the treatment as reflected in this note. Stevi Hollinshead, PT DPT  

## 2012-12-07 NOTE — Progress Notes (Signed)
Speech Language Pathology Weekly Progress Note  Patient Details  Name: [name] MRN: [id number] Date of Birth: 1945-03-01  Today's Date: 12/07/2012 Time: 0830-0930 Time Calculation (min): 60 min  Short Term Goals: Week 1: SLP Short Term Goal 1 (Week 1): Pt will consume Dys. 1 textures and nectar thick liquids with minimal overt s/s of aspiration. SLP Short Term Goal 1 - Progress (Week 1): Met SLP Short Term Goal 2 (Week 1): Pt will perform oral motor exercises with Max cues. SLP Short Term Goal 2 - Progress (Week 1): Not met SLP Short Term Goal 3 (Week 1): Pt will utilize speech intelligibility strategies with Max cues. SLP Short Term Goal 3 - Progress (Week 1): Not met SLP Short Term Goal 4 (Week 1): Pt will utilize external aids to answer orientation questions with Max cues. SLP Short Term Goal 4 - Progress (Week 1): Met SLP Short Term Goal 5 (Week 1): Pt will utilize external memory aids with Max cues. SLP Short Term Goal 5 - Progress (Week 1): Not met SLP Short Term Goal 6 (Week 1): Pt will sustain attention to functional task for 15 min with Max cues. SLP Short Term Goal 6 - Progress (Week 1): Met  New Short Term Goals: Week 2: SLP Short Term Goal 1 (Week 2): Pt will utilize safe swallowing strategies with dys. 1 textures and nectar-thick liquids with Mod cues. SLP Short Term Goal 2 (Week 2): Pt will perform oral motor exercises with Max cues. SLP Short Term Goal 3 (Week 2): Pt will utilize speech intelligibility strategies with Max cues. SLP Short Term Goal 4 (Week 2): Pt will utilize external memory aids with Max cues. SLP Short Term Goal 5 (Week 2): Pt will sustain attention to functional task for 15 min with Min cues. SLP Short Term Goal 6 (Week 2): Pt will demonstrate intellectual awareness of at least 2 impairments with Mod cues.  Weekly Progress Updates: Pt has reached 3/6 STGs during this reporting period. She appears to be tolerating dys. 1 textures and nectar  thick liquids, however oral dysphagia and cognitive impairments impact her ability to safely trial advanced textures. At this time she needs Max cues for utilization of safe swallow strategies. She requires Mod cues to sustain attention and utilized external aids to with Min cues to answer orientation questions correctly. Overall, pt's performance appears to be impacted by her impaired intellectual awareness of speech and swallowing deficits, which reduces her ability to utilize compensatory strategies. Pt will benefit from continued SLP services to maximize swallowing safety and functional communication as well as decrease caregiver burden prior to discharge.   SLP Intensity: Minumum of 1-2 x/day, 30 to 90 minutes SLP Frequency: 5 out of 7 days SLP Duration/Estimated Length of Stay: 2-3 weeks SLP Treatment/Interventions: Cognitive remediation/compensation;Cueing hierarchy;Dysphagia/aspiration precaution training;Environmental controls;Functional tasks;Internal/external aids;Multimodal communication approach;Oral motor exercises;Patient/family education;Speech/Language facilitation  Daily Session Skilled Therapeutic Intervention: Treatment focused on speech, swallowing, and cognitive goals. SLP facilitated session with skilled observation of limited PO intake consisting of dys. 1 textures and nectar thick liquids with no overt s/s of aspiration observed. Pt continues to require Max cues for utilization of oral clearance strategies and Mod cues for sustained attention to self-feeding task. Pt verbalized her need to go to the bathroom with Min A. Pt unable to utilize speech intelligibility strategies despite Max cues from SLP. Pt required Max cues, including listening to an audio clip of her own speech, to demonstrate intellectual awareness of dysarthric speech.  FIM:  Comprehension Comprehension Mode: Auditory Comprehension: 4-Understands basic 75 - 89% of the time/requires cueing 10 - 24% of the  time Expression Expression Mode: Verbal Expression: 3-Expresses basic 50 - 74% of the time/requires cueing 25 - 50% of the time. Needs to repeat parts of sentences. Social Interaction Social Interaction: 2-Interacts appropriately 25 - 49% of time - Needs frequent redirection. Problem Solving Problem Solving: 2-Solves basic 25 - 49% of the time - needs direction more than half the time to initiate, plan or complete simple activities Memory Memory: 1-Recognizes or recalls less than 25% of the time/requires cueing greater than 75% of the time FIM - Eating Eating Activity: 5: Set-up assist for cut food;5: Needs verbal cues/supervision;4: Helper checks for pocketed food General    Pain Pain Assessment Pain Score: Asleep  Therapy/Group: Individual Therapy  Maxcine Ham 12/07/2012, 10:42 AM  Maxcine Ham, M.A. CCC-SLP [phone]

## 2012-12-07 NOTE — Progress Notes (Signed)
PHARMACY FOLLOW UP NOTE   Pharmacy Consult for : Coumadin Indication: Cardioembolic CVA with apical thrombus  Dosing Weight: 39 kg  Labs:  Lab Results  Component Value Date   INR 2.36* 12/07/2012   INR 2.42* 12/06/2012   INR 2.22* 12/05/2012   Pertinent Medications:  Scheduled:  . antiseptic oral rinse  15 mL Mouth Rinse q12n4p  . carvedilol  3.125 mg Oral BID WC  . citalopram  10 mg Oral Daily  . feeding supplement (ENSURE)  1 Container Oral TID BM  . multivitamin with minerals  1 tablet Oral Daily  . pantoprazole  40 mg Oral QHS  . QUEtiapine  25 mg Oral QHS  . ramipril  2.5 mg Oral Daily  . Warfarin - Pharmacist Dosing Inpatient   Does not apply q1800   Assessment:  67 yo female with cardioembolic CVA with apical clot is currently on chronic Coumadin.  INR remains therapeutic with No bleeding complications noted   Goal:  INR 2-3   Plan: 1. Coumadin 3 mg daily. 2. Daily INR's, CBC.  Monitor for bleeding complications.  If INR remains stable, will lengthen interval between INR checks.   Pauleen Goleman, Deetta Perla.D 12/07/2012, 2:56 PM

## 2012-12-07 NOTE — Progress Notes (Signed)
NUTRITION FOLLOW-UP  DOCUMENTATION CODES Per approved criteria  -Severe malnutrition in the context of chronic illness -Underweight   INTERVENTION: Continue Ensure Pudding po TID, each supplement provides 170 kcal and 4 grams of protein.  Continue Magic cup TID with all meals, each supplement provides 290 kcal and 9 grams of protein. Continue MVI daily. RD to continue to follow nutrition care plan.  NUTRITION DIAGNOSIS: Malnutrition related to chronic illness as evidenced by severe fat and muscle wasting. Ongoing.  Goal: Pt to meet >/= 90% of their estimated nutrition needs - unmet.  Monitor:  PO intake, supplement acceptance, weight trend, labs   ASSESSMENT: Pt with hx of multiple strokes who is non-compliant with her medications is admitted with new left cerebellar and right posterior strokes. Pt somewhat difficult to understand.   Plan for d/c at 10/22 to home with niece who will provide 24-hour care.  Continues with orders for Dysphagia 1 diet with nectar thickened liquids and Ensure Pudding PO TID. Intake is on average 50% of meals. Per staff, pt enjoys Ensure Pudding supplements.  Per most recent SLP note, pt continues to demonstrate oral holding with oral expectoration, anterior spillage, lingual residue, and pocketed foods. Requiring max cues of oral clearance strategies and mod cues for sustained attention to self-feeding task.  Height: Ht Readings from Last 1 Encounters:  11/29/12 5\' 1"  (1.549 m)    Weight: Wt Readings from Last 1 Encounters:  12/05/12 85 lb (38.556 kg)  Admit wt: 80 lb  BMI:  Body mass index is 16.07 kg/(m^2). Underweight  Estimated Nutritional Needs: Kcal: 1200-1400 Protein: 60-70 grams Fluid: > 1.5 L/day  Skin: stage II pressure ulcer to coccyx  Diet Order: Dysphagia 1 with Nectar Thick Liquids  EDUCATION NEEDS: -No education needs identified at this time   Intake/Output Summary (Last 24 hours) at 12/07/12 1119 Last data filed at  12/07/12 0627  Gross per 24 hour  Intake     60 ml  Output      1 ml  Net     59 ml    Last BM: 10/16  Labs:  No results found for this basename: NA, K, CL, CO2, BUN, CREATININE, CALCIUM, MG, PHOS, GLUCOSE,  in the last 168 hours  CBG (last 3)  No results found for this basename: GLUCAP,  in the last 72 hours  Scheduled Meds: . antiseptic oral rinse  15 mL Mouth Rinse q12n4p  . carvedilol  3.125 mg Oral BID WC  . citalopram  10 mg Oral Daily  . feeding supplement (ENSURE)  1 Container Oral TID BM  . multivitamin with minerals  1 tablet Oral Daily  . pantoprazole  40 mg Oral QHS  . QUEtiapine  25 mg Oral QHS  . ramipril  2.5 mg Oral Daily  . Warfarin - Pharmacist Dosing Inpatient   Does not apply q1800    Continuous Infusions: none    Jarold Motto MS, RD, LDN Pager: [phone] After-hours pager: [phone]

## 2012-12-07 NOTE — Progress Notes (Signed)
Occupational Therapy Weekly Progress Note  Patient Details  Name: [name] MRN: [id number] Date of Birth: 13-Sep-1945  Today's Date: 12/07/2012 Time:  0930-1030 and 1335-1400 Time calculation (min): 60 min and 25 min  Patient has met 3 of 3 short term goals.  Patient has been progressing well in therapy. Patient currently requires min assist for all transfers and for standing balance during LB self-care tasks. Patient is very anxious during transfers and hesitant to weight bear through LLE however as been increasing weight bearing past 2 days.   Patient continues to demonstrate the following deficits: decreased standing balance, decreased coordination, decreased postural control in standing, decreased awareness, decreased attention, decreased strength, decrease functional use of LLE, decreased activity tolerance, impaired cognitive skills and therefore will continue to benefit from skilled OT intervention to enhance overall performance with BADL.  Patient progressing toward long term goals. Goals revised to min assist when standing and during functional transfers as patient is hesitant to weight bear through LLE in standing activities.  Continue plan of care.  OT Short Term Goals Week 1:  OT Short Term Goal 1 (Week 1): Pt will wash 9/10 body parts with min verbal cues and steadying assist  OT Short Term Goal 1 - Progress (Week 1): Met OT Short Term Goal 2 (Week 1): Pt will complete stand pivot toilet transfer with min assist and weight bearing through LLE OT Short Term Goal 2 - Progress (Week 1): Met OT Short Term Goal 3 (Week 1): Pt will complete toilet task with min assist  OT Short Term Goal 3 - Progress (Week 1): Met Week 2:  OT Short Term Goal 1 (Week 2): Focus on LTGs  Skilled Therapeutic Interventions/Progress Updates:    Session 1: Therapy session focused on ADL retraining, activity tolerance, standing balance, and functional transfers. Pt received supine in bed and on 2L O2.  SpO2 100% upon entry. Removed O2 and ambulated to TTB in walk-in shower and SpO2 remained>95%. Completed stand pivot transfer to/from TTB with min assist and min assist for standing balance during bathing and LB clothing management. Pt with slight SOB on one occasion however SpO2 was 95%. Pt remained off O2 for entire session then placed back on it when pt left sitting at nursing station with quick release belt.   Session 2: Therapy session focused on cognitive skills, BUE coordination, and visual deficits. Pt completed simple pipe muzzle task with max verbal cues and no attention to L side of puzzle and unable to locate connector pieces. Pt with good sustained attention approx 30 sec during activity. Completed peg task placed in midline. Decreased coordination when using both LUE and RUE when placing medium pegs on left side of board. Moved board to right visual field and increase in coordination noted. Moved board to left visual field and pt with decreased coordination and visual deficits noted as she was compensating by using finger to feel for holes. Pt able to report blurred vision however difficult to determine if from previous CVA secondary to cognitive linguistic skills. Pt left at nurses station at end of therapy session.   Therapy Documentation Precautions:  Precautions Precautions: Fall Precaution Comments: severe dysarthria Restrictions Weight Bearing Restrictions: No General:   Vital Signs: Therapy Vitals Temp: 98.5 F (36.9 C) Temp src: Oral Pulse Rate: 60 Resp: 20 BP: 125/83 mmHg Patient Position, if appropriate: Lying Oxygen Therapy SpO2: 97 % O2 Device: None (Room air) Pain: Pt with no c/o pain during therapy session.   Exercises:  Other Treatments:    See FIM for current functional status  Therapy/Group: Individual Therapy  Daneil Dan 12/07/2012, 7:45 AM

## 2012-12-07 NOTE — Plan of Care (Signed)
Problem: RH Bathing Goal: LTG Patient will bathe with assist, cues/equipment (OT) LTG: Patient will bathe specified number of body parts with assist with/without cues using equipment (position) (OT)  Downgraded to min assist as pt requires steadying assist secondary to pt hesitant to weight bear through LLE.

## 2012-12-07 NOTE — Plan of Care (Signed)
Problem: RH Dressing Goal: LTG Patient will perform upper body dressing (OT) LTG Patient will perform upper body dressing with assist, with/without cues (OT).  Downgraded as pt requires assist to hook bra.

## 2012-12-08 ENCOUNTER — Inpatient Hospital Stay (HOSPITAL_COMMUNITY): Payer: Medicare Other | Admitting: Occupational Therapy

## 2012-12-08 LAB — PROTIME-INR: INR: 2.77 — ABNORMAL HIGH (ref 0.00–1.49)

## 2012-12-08 MED ORDER — WARFARIN SODIUM 2.5 MG PO TABS
2.5000 mg | ORAL_TABLET | Freq: Every day | ORAL | Status: DC
  Administered 2012-12-08 – 2012-12-09 (×2): 2.5 mg via ORAL
  Filled 2012-12-08 (×3): qty 1

## 2012-12-08 NOTE — Progress Notes (Signed)
Occupational Therapy Session Note  Patient Details  Name: [name] MRN: [id number] Date of Birth: 12/15/1945  Today's Date: 12/08/2012 Time: 4098-1191 Time Calculation (min): 60 min  Skilled Therapeutic Interventions/Progress Updates: ADL in w/c at sink with overall mod A due to patient instability while standing.  Patient had difficulty articulating her comments clearly.  Patient left at nurses station with safety belt in w/c after session.  Focus today mainly on stability with sit to stand and standing tolerance and stability.      Therapy Documentation Precautions:  Precautions Precautions: Fall Precaution Comments: severe dysarthria Restrictions Weight Bearing Restrictions: No Pain:denied   See FIM for current functional status  Therapy/Group: Individual Therapy  Bud Face [facility] 12/08/2012, 1:56 PM

## 2012-12-08 NOTE — Progress Notes (Signed)
Patient ID: [name], female   DOB: 1946-01-18, 67 y.o.   MRN: [id number] Subjective/Complaints:  10/18.  67 y.o. 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 Admitted 11/24/2012 with slurred speech. MRI of the brain showed acute infarcts affecting the left cerebellum and right posterior temporal regions.   Echocardiogram with ejection fraction of 15% no wall motion abnormalities and small apical thrombus. Carotid Dopplers with no ICA stenosis. Neurology services consulted. Patient did receive TPA. Findings of elevated troponin/small non-Q-wave myocardial infarction and followup cardiology services  Dysarthria improving but still speech unintelligible No pain c/os Review of Systems - Negative except dysarthria, severe  Objective: Vital Signs: Blood pressure 127/85, pulse 55, temperature 97.8 F (36.6 C), temperature source Oral, resp. rate 16, height 5\' 1"  (1.549 m), weight 36.651 kg (80 lb 12.8 oz), SpO2 100.00%. No results found. Results for orders placed during the hospital encounter of 11/29/12 (from the past 72 hour(s))  PROTIME-INR     Status: Abnormal   Collection Time    12/06/12  5:00 AM      Result Value Range   Prothrombin Time 25.5 (*) 11.6 - 15.2 seconds   INR 2.42 (*) 0.00 - 1.49  PROTIME-INR     Status: Abnormal   Collection Time    12/07/12  6:30 AM      Result Value Range   Prothrombin Time 25.0 (*) 11.6 - 15.2 seconds   INR 2.36 (*) 0.00 - 1.49  PROTIME-INR     Status: Abnormal   Collection Time    12/08/12  5:40 AM      Result Value Range   Prothrombin Time 28.3 (*) 11.6 - 15.2 seconds   INR 2.77 (*) 0.00 - 1.49    Patient Vitals for the past 24 hrs:  BP Temp Temp src Pulse Resp SpO2 Weight  12/08/12 0518 127/85 mmHg 97.8 F (36.6 C) -  55 16 100 % 36.651 kg (80 lb 12.8 oz)  12/07/12 1621 139/93 mmHg - - - - - -  12/07/12 1618 142/103 mmHg 97.6 F (36.4 C) Oral 108 18 98 % -    Intake/Output Summary (Last 24 hours) at 12/08/12 0851 Last data filed at 12/07/12 1400  Gross per 24 hour  Intake    150 ml  Output      0 ml  Net    150 ml    HEENT: normal  N/C O2 in place Cardio: RRR and no murmurs Resp: CTA B/L GI: BS positive and non distended Extremity:  Pulses positive; TEDs in place Skin:   Intact Neuro: Confused, Cranial Nerve II-XII normal, Abnormal Sensory unable to assess due to dysarthria-severe, Abnormal Motor 4/5 in BUE and BLE and Dysarthric, ataxia L FNF Musc/Skel:  Normal Gen NAD, thin    Assessment/Plan: 1. Functional deficits secondary to Left cerebellar infarct which require 3+ hours per day of interdisciplinary therapy in a comprehensive inpatient rehab setting. 2. DVT Prophylaxis/Anticoagulation: Coumadin therapy. Monitor for any bleeding episodes. 3. . Pain Management: Tylenol as needed  4. Dysphagia. Dysphagia 1 nectar liquids. Followup speech therapy. Monitor for any signs of aspiration  5.  Small non-Q wave myocardial infarction/severe nonischemic cardiomyopathy/apical thrombus. Continue conservative care and maintain Coumadin therapy. Followup cardiology services  6.  Hypertension. Altace 2.5 mg daily, Coreg 3.125 mg twice a day. Monitor with increased mobility  LOS (Days) 9 A FACE TO FACE EVALUATION WAS PERFORMED  Rogelia Boga 12/08/2012, 8:50 AM

## 2012-12-08 NOTE — Progress Notes (Signed)
PHARMACY FOLLOW UP NOTE   Pharmacy Consult for : Coumadin Indication: Cardioembolic CVA with apical thrombus  Dosing Weight: 39 kg  Labs:  Lab Results  Component Value Date   INR 2.77* 12/08/2012   INR 2.36* 12/07/2012   INR 2.42* 12/06/2012   Pertinent Medications:  Scheduled:  . antiseptic oral rinse  15 mL Mouth Rinse q12n4p  . carvedilol  3.125 mg Oral BID WC  . citalopram  10 mg Oral Daily  . feeding supplement (ENSURE)  1 Container Oral TID BM  . multivitamin with minerals  1 tablet Oral Daily  . pantoprazole  40 mg Oral QHS  . QUEtiapine  25 mg Oral QHS  . ramipril  2.5 mg Oral Daily  . warfarin  3 mg Oral q1800  . Warfarin - Pharmacist Dosing Inpatient   Does not apply q1800   Assessment:  67 yo female with cardioembolic CVA with apical clot is currently on chronic Coumadin.  INR remains therapeutic but is trending upward.  No bleeding complications noted   Goal:  INR 2-3   Plan: 1. Will decrease Coumadin to 2.5 mg daily. 2. Daily INR's, CBC.  Monitor for bleeding complications.  If INR remains stable, will lengthen interval between INR checks.   Nadara Mustard, PharmD., MS Clinical Pharmacist Pager:  [phone] Thank you for allowing pharmacy to be part of this patients care team. 12/08/2012, 8:55 AM

## 2012-12-09 ENCOUNTER — Inpatient Hospital Stay (HOSPITAL_COMMUNITY): Payer: Medicare Other | Admitting: Occupational Therapy

## 2012-12-09 DIAGNOSIS — I219 Acute myocardial infarction, unspecified: Secondary | ICD-10-CM

## 2012-12-09 LAB — PROTIME-INR
INR: 2.74 — ABNORMAL HIGH (ref 0.00–1.49)
Prothrombin Time: 28.1 seconds — ABNORMAL HIGH (ref 11.6–15.2)

## 2012-12-09 NOTE — Progress Notes (Signed)
PHARMACY FOLLOW UP NOTE   Pharmacy Consult for : Coumadin Indication: Cardioembolic CVA with apical thrombus  Dosing Weight: 36.7 kg  Labs:  Lab Results  Component Value Date   INR 2.74* 12/09/2012   INR 2.77* 12/08/2012   INR 2.36* 12/07/2012   Pertinent Medications:  Scheduled:  . antiseptic oral rinse  15 mL Mouth Rinse q12n4p  . carvedilol  3.125 mg Oral BID WC  . citalopram  10 mg Oral Daily  . feeding supplement (ENSURE)  1 Container Oral TID BM  . multivitamin with minerals  1 tablet Oral Daily  . pantoprazole  40 mg Oral QHS  . QUEtiapine  25 mg Oral QHS  . ramipril  2.5 mg Oral Daily  . warfarin  2.5 mg Oral q1800  . Warfarin - Pharmacist Dosing Inpatient   Does not apply q1800   Assessment:  67 yo female with cardioembolic CVA with apical clot is currently on chronic Coumadin.  INR remains therapeutic at 2.74 and is within goal range.  No bleeding complications noted   Goal:  INR 2-3   Plan: 1. Will continue with Coumadin to 2.5 mg daily. Change INR's to every MWF, CBC to weekly.    Nadara Mustard, PharmD., MS Clinical Pharmacist Pager:  [phone] Thank you for allowing pharmacy to be part of this patients care team. 12/09/2012, 9:40 AM

## 2012-12-09 NOTE — Progress Notes (Signed)
Patient ID: [name], female   DOB: 1945-09-12, 67 y.o.   MRN: [id number] Patient ID: [name], female   DOB: Jan 28, 1946, 67 y.o.   MRN: [id number] Subjective/Complaints:  10/19.   67 y.o. right-handed female with history of thrombotic right MCA infarct  who  received inpatient rehabilitation services 10/11/2011 until 10/22/2011 as well as history of seizure, substance abuse, hypertension, COPD and nonischemic cardiomyopathy secondary to cocaine abuse. Admitted 11/24/2012 with slurred speech. MRI of the brain showed acute infarcts affecting the left cerebellum and right posterior temporal regions.   Echocardiogram with ejection fraction of 15% no wall motion abnormalities and small apical thrombus. Carotid Dopplers with no ICA stenosis. Neurology services consulted. Patient did receive TPA. Findings of elevated troponin/small non-Q-wave myocardial infarction and followup cardiology services  Dysarthria improving but still speech unintelligible No pain c/os  Review of Systems - Negative except dysarthria, severe  Objective: Vital Signs: Blood pressure 118/74, pulse 48, temperature 97.5 F (36.4 C), temperature source Oral, resp. rate 18, height 5\' 1"  (1.549 m), weight 36.651 kg (80 lb 12.8 oz), SpO2 100.00%. No results found. Results for orders placed during the hospital encounter of 11/29/12 (from the past 72 hour(s))  PROTIME-INR     Status: Abnormal   Collection Time    12/07/12  6:30 AM      Result Value Range   Prothrombin Time 25.0 (*) 11.6 - 15.2 seconds   INR 2.36 (*) 0.00 - 1.49  PROTIME-INR     Status: Abnormal   Collection Time    12/08/12  5:40 AM      Result Value Range   Prothrombin Time 28.3 (*) 11.6 - 15.2 seconds   INR 2.77 (*) 0.00 - 1.49    Patient Vitals for the past 24 hrs:  BP Temp Temp src Pulse Resp SpO2  12/09/12 0536 118/74 mmHg 97.5 F (36.4 C) Oral 48 18 100 %    Intake/Output Summary (Last 24 hours) at 12/09/12 0849 Last data filed at  12/08/12 2300  Gross per 24 hour  Intake    470 ml  Output      0 ml  Net    470 ml    HEENT: normal  N/C O2 in place Cardio: RRR and no murmurs Resp: CTA B/L GI: BS positive and non distended Extremity:  Pulses positive; TEDs in place Skin:   Intact Neuro: Confused, Cranial Nerve II-XII normal, Abnormal Sensory unable to assess due to dysarthria-severe, Abnormal Motor 4/5 in BUE and BLE and Dysarthric, ataxia L FNF Musc/Skel:  Normal Gen NAD, thin    Assessment/Plan: 1. Functional deficits secondary to Left cerebellar infarct which require 3+ hours per day of interdisciplinary therapy in a comprehensive inpatient rehab setting. 2. DVT Prophylaxis/Anticoagulation: Coumadin therapy. Monitor for any bleeding episodes. 3. . Pain Management: Tylenol as needed  4. Dysphagia. Dysphagia 1 nectar liquids. Followup speech therapy. Monitor for any signs of aspiration  5.  Small non-Q wave myocardial infarction/severe nonischemic cardiomyopathy/apical thrombus. Continue conservative care and maintain Coumadin therapy. Followup cardiology services  6.  Hypertension. Altace 2.5 mg daily, Coreg 3.125 mg twice a day. Monitor with increased mobility      LOS (Days) 10 A FACE TO FACE EVALUATION WAS PERFORMED  Rogelia Boga 12/09/2012, 8:49 AM

## 2012-12-09 NOTE — Progress Notes (Signed)
Occupational Therapy Session Note  Patient Details  Name: [name] MRN: [id number] Date of Birth: 1945/09/02  Today's Date: 12/09/2012 Time: 0730-0820 Time Calculation (min): 50 min   Skilled Therapeutic Interventions/Progress Updates: Patient sleeping upon approached and offered showered for bathing and dressing therapy.  Initially she replied, "Yes;" however, after patient completed toileting she stated, "... too cold ...no [shower]."  She completed bathing and dressing in w/c at sink with focus on lower body bathing and dressing and dynamic standing.       After completion of ADL patient, safety belt placed, patient was wrapped in blankets and taken with portable oxygen to nurses station to complete breakfast with nursing supervision and help PRN.    Therapy Documentation Precautions:  Precautions Precautions: Fall Precaution Comments: severe dysarthria Restrictions Weight Bearing Restrictions: No  Pain:denied  See FIM for current functional status  Therapy/Group: Individual Therapy  Bud Face [facility] 12/09/2012, 8:59 AM

## 2012-12-10 ENCOUNTER — Inpatient Hospital Stay (HOSPITAL_COMMUNITY): Payer: Medicare Other

## 2012-12-10 ENCOUNTER — Inpatient Hospital Stay (HOSPITAL_COMMUNITY): Payer: Medicare Other | Admitting: *Deleted

## 2012-12-10 LAB — PROTIME-INR
INR: 3.32 — ABNORMAL HIGH (ref 0.00–1.49)
Prothrombin Time: 32.5 seconds — ABNORMAL HIGH (ref 11.6–15.2)

## 2012-12-10 NOTE — Progress Notes (Signed)
Social Work Patient ID: [name], female   DOB: 17-Oct-1945, 67 y.o.   MRN: [id number] Spoke with niece to schedule family education for tomorrow at 10;00 she will also come with her Mom ,pt's sister.  Will address pt's discharge needs then. Niece was providing care to pt prior to admission here.

## 2012-12-10 NOTE — Progress Notes (Signed)
Speech Language Pathology Daily Session Note  Patient Details  Name: [name] MRN: [id number] Date of Birth: March 21, 1945  Today's Date: 12/10/2012 Time: 0900-0945 Time Calculation (min): 45 min  Short Term Goals: Week 2: SLP Short Term Goal 1 (Week 2): Pt will utilize safe swallowing strategies with dys. 1 textures and nectar-thick liquids with Mod cues. SLP Short Term Goal 2 (Week 2): Pt will perform oral motor exercises with Max cues. SLP Short Term Goal 3 (Week 2): Pt will utilize speech intelligibility strategies with Max cues. SLP Short Term Goal 4 (Week 2): Pt will utilize external memory aids with Max cues. SLP Short Term Goal 5 (Week 2): Pt will sustain attention to functional task for 15 min with Min cues. SLP Short Term Goal 6 (Week 2): Pt will demonstrate intellectual awareness of at least 2 impairments with Mod cues.  Skilled Therapeutic Interventions: Treatment focused on speech, swallowing, and cognitive goals. SLP facilitated session with Mod cues to utilize her speech intelligibility strategies while pt was communicating wants/needs. Pt consumed breakfast tray of dys. 1 textures and nectar thick liquids with wet vocal quality x1, which returned to baseline with a cued throat clear. No other overt s/s of aspiration were observed with Mod cues for utilization of safe swallowing strategies. Pt performed oral motor exercises with Max multimodal cues. She sustained attention to breakfast meal for 15 min with Min cues for redirection. Continue plan of care.   FIM:  Comprehension Comprehension Mode: Auditory Comprehension: 4-Understands basic 75 - 89% of the time/requires cueing 10 - 24% of the time Expression Expression Mode: Verbal Expression: 3-Expresses basic 50 - 74% of the time/requires cueing 25 - 50% of the time. Needs to repeat parts of sentences. Social Interaction Social Interaction: 4-Interacts appropriately 75 - 89% of the time - Needs redirection for  appropriate language or to initiate interaction. Problem Solving Problem Solving: 2-Solves basic 25 - 49% of the time - needs direction more than half the time to initiate, plan or complete simple activities Memory Memory: 1-Recognizes or recalls less than 25% of the time/requires cueing greater than 75% of the time FIM - Eating Eating Activity: 5: Set-up assist for open containers;5: Needs verbal cues/supervision;4: Helper checks for pocketed food  Pain Pain Assessment Pain Assessment: No/denies pain  Therapy/Group: Individual Therapy  Maxcine Ham 12/10/2012, 11:57 AM  Maxcine Ham, M.A. CCC-SLP [phone]

## 2012-12-10 NOTE — Progress Notes (Signed)
Subjective/Complaints: 67 y.o. 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 Admitted 11/24/2012 with slurred speech. MRI of the brain showed acute infarcts affecting the left cerebellum and right posterior temporal regions. Also noted extensive encephalomalacia related to remote bihemispheric infarction. 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 Echocardiogram with ejection fraction of 15% no wall motion abnormalities and small apical thrombus. Carotid Dopplers with no ICA stenosis. Neurology services consulted. Patient did receive TPA. Findings of elevated troponin/small non-Q-wave myocardial infarction and followup cardiology services  Dysarthria improving  No pain c/os Review of Systems - Negative except using O2, denies SOB  Objective: Vital Signs: Blood pressure 126/72, pulse 55, temperature 98.1 F (36.7 C), temperature source Oral, resp. rate 18, height 5\' 1"  (1.549 m), weight 36.651 kg (80 lb 12.8 oz), SpO2 99.00%. No results found. Results for orders placed during the hospital encounter of 11/29/12 (from the past 72 hour(s))  PROTIME-INR     Status: Abnormal   Collection Time    12/08/12  5:40 AM      Result Value Range   Prothrombin Time 28.3 (*) 11.6 - 15.2 seconds   INR 2.77 (*) 0.00 - 1.49  PROTIME-INR     Status: Abnormal   Collection Time    12/09/12  8:45 AM      Result Value Range   Prothrombin Time 28.1 (*) 11.6 - 15.2 seconds   INR 2.74 (*) 0.00 - 1.49  PROTIME-INR     Status: Abnormal   Collection Time    12/10/12  6:00 AM      Result Value Range   Prothrombin Time 32.5 (*) 11.6 - 15.2 seconds   INR 3.32 (*) 0.00 - 1.49     HEENT: normal Cardio: RRR and no murmurs Resp: CTA  B/L GI: BS positive and non distended Extremity:  Pulses positive Skin:   Intact Neuro: Confused, Cranial Nerve II-XII normal, Abnormal Sensory unable to assess due to dysarthria-severe, Abnormal Motor 4/5 in BUE and BLE and Dysarthric, ataxia L FNF Musc/Skel:  Normal Gen NAD   Assessment/Plan: 1. Functional deficits secondary to Left cerebellar infarct which require 3+ hours per day of interdisciplinary therapy in a comprehensive inpatient rehab setting. Physiatrist is providing close team supervision and 24 hour management of active medical problems listed below. Physiatrist and rehab team continue to assess barriers to discharge/monitor patient progress toward functional and medical goals. FIM: FIM - Bathing Bathing Steps Patient Completed: Chest;Right Arm;Left Arm;Abdomen;Front perineal area;Right upper leg;Left upper leg Bathing: 3: Mod-Patient completes 5-7 40f 10 parts or 50-74%  FIM - Upper Body Dressing/Undressing Upper body dressing/undressing steps patient completed: Thread/unthread right sleeve of pullover shirt/dresss;Pull shirt over trunk;Put head through opening of pull over shirt/dress;Thread/unthread left sleeve of pullover shirt/dress Upper body dressing/undressing: 5: Supervision: Safety issues/verbal cues FIM - Lower Body Dressing/Undressing Lower body dressing/undressing steps patient completed: Thread/unthread right pants leg;Don/Doff right shoe;Don/Doff left shoe (wears brief during the day "just in case") Lower body dressing/undressing: 2: Max-Patient completed 25-49% of tasks  FIM - Toileting Toileting steps completed by patient: Performs perineal hygiene Toileting Assistive Devices: Grab bar or rail for support Toileting: 2: Max-Patient completed 1 of 3 steps  FIM - Diplomatic Services operational officer Devices: Grab bars Toilet Transfers: 4-From toilet/BSC: Min A (  steadying Pt. > 75%);4-To toilet/BSC: Min A (steadying Pt. > 75%)  FIM - Bed/Chair  Transfer Bed/Chair Transfer Assistive Devices: Bed rails;Arm rests Bed/Chair Transfer: 3: Chair or W/C > Bed: Mod A (lift or lower assist);3: Bed > Chair or W/C: Mod A (lift or lower assist)  FIM - Locomotion: Wheelchair Distance: 75 Locomotion: Wheelchair: 2: Travels 50 - 149 ft with supervision, cueing or coaxing FIM - Locomotion: Ambulation Locomotion: Ambulation Assistive Devices: Designer, industrial/product Ambulation/Gait Assistance: 5: Supervision Locomotion: Ambulation: 2: Travels 50 - 149 ft with minimal assistance (Pt.>75%)  Comprehension Comprehension Mode: Auditory Comprehension: 4-Understands basic 75 - 89% of the time/requires cueing 10 - 24% of the time  Expression Expression Mode: Verbal Expression: 3-Expresses basic 50 - 74% of the time/requires cueing 25 - 50% of the time. Needs to repeat parts of sentences.  Social Interaction Social Interaction Mode: Asleep Social Interaction: 4-Interacts appropriately 75 - 89% of the time - Needs redirection for appropriate language or to initiate interaction.  Problem Solving Problem Solving: 2-Solves basic 25 - 49% of the time - needs direction more than half the time to initiate, plan or complete simple activities  Memory Memory Mode: Asleep Memory: 1-Recognizes or recalls less than 25% of the time/requires cueing greater than 75% of the time   Medical Problem List and Plan:  1. 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 DVT Prophylaxis/Anticoagulation: Coumadin therapy. Monitor for any bleeding episodes. 3. Pain Management: Tylenol as needed  4. Dysphagia. Dysphagia 1 nectar liquids. Followup speech therapy. Monitor for any signs of aspiration  5. Neuropsych: This patient is not capable of making decisions on her own behalf.  6. Small non-Q wave myocardial infarction/severe nonischemic cardiomyopathy/apical thrombus. Continue conservative care and maintain Coumadin therapy. Followup cardiology services  7.  Hypertension. Altace 2.5 mg daily, Coreg 3.125 mg twice a day. Monitor with increased mobility  8. History of polysubstance abuse. Urine drug screen negative.  9.  COPD on 2l [place] , will try to wean, Sats have been 95-100%   LOS (Days) 11 A FACE TO FACE EVALUATION WAS PERFORMED  Laiza Veenstra E 12/10/2012, 9:12 AM

## 2012-12-10 NOTE — Progress Notes (Signed)
Occupational Therapy Session Note  Patient Details  Name: [name] MRN: [id number] Date of Birth: 07-08-1945  Today's Date: 12/10/2012 Time: 1030-1130 and 1332-1400 Time Calculation (min): 60 min and 28 min   Short Term Goals: Week 2:  OT Short Term Goal 1 (Week 2): Focus on LTGs  Skilled Therapeutic Interventions/Progress Updates:    Session 1: Therapy session focused on ADL retraining, functional transfers, sit<>stand, and standing balance. Pt received at nurses station on 2L O2. Pt completed stand pivot transfer w/c<>walk-in shower with min assist and increased anxiety and pt not weight bearing through LLE. Required min assist for sitting balance for buttocks hygiene and min verbal cues to wash all body parts. Completed dressing from w/c level requiring min assist to hook bra. Completed sit<>stand with min assist and verbal cues for hand placement. At end of session practiced sit<>stand w/c to standing with RW. Completed 5 total and 3/5 at supervision level with verbal cues for hand placement. Pt left sitting at nurses station on 2L O2.   Session 2: Pt received sitting in w/c at nurses station. Pt ambulated short distances (approx 8-10 feet) with min assist using RW to practice tub transfer. Pt completed tub transfer twice with min assist using RW and using TTB. Pt required rest breaks between each stating "I'm tired." Pt then engaged in standing balance/standing tolerance task by reaching into overhead cabinets for approx 4 min before requiring rest breaks. Completed 2x with min assist. At end of session pt left at nurses station.  Therapy Documentation Precautions:  Precautions Precautions: Fall Precaution Comments: severe dysarthria Restrictions Weight Bearing Restrictions: No General:   Vital Signs: Therapy Vitals BP: 126/72 mmHg Pain: No c/o pain during therapy sessions.   See FIM for current functional status  Therapy/Group: Individual Therapy  Daneil Dan 12/10/2012, 12:11 PM

## 2012-12-10 NOTE — Progress Notes (Signed)
I have reviewed and agree with the treatment as reflected in this note. Harman Ferrin, PT DPT  

## 2012-12-10 NOTE — Progress Notes (Signed)
Physical Therapy Session Note  Patient Details  Name: [name] MRN: [id number] Date of Birth: 1945/03/01  Today's Date: 12/10/2012 Time: 4098-1191 Time Calculation (min): 48 min  Short Term Goals: Week 2:  PT Short Term Goal 1 (Week 2): STGs=LTGs  Skilled Therapeutic Interventions/Progress Updates:    Patient received seated in wheelchair at nurses station. Session focused on gait training, functional transfers, and stair negotiation. See details below. Patient instructed in squat pivot transfer wheelchair>mat table(R side) minA, patient demonstrated decreased stance time on LLE, verbal/tactile  cues for hand placement and for pivoting with R LE. Attempted squat pivot transfer mat>wheelchair (L side), patient demonstrated difficulty with managing hand placement to initiate transfer and upon standing no weight bearing through L LE making transfer to the L difficult, patient returned to sitting on mat. Instructed patient in stand pivot transfer mat table>wheelchair (L side) with RW supervision level, patient demonstrated step-to gait pattern with decreased stance time on L LE to complete turn to sit in wheelchair. Verbal cues for sequencing and turning completely prior to sitting down. Patient instructed in second trial stand pivot transfer wheelchair>mat table (R side) with RW, supervision level, verbal cues for sequencing and for maintaining contact of RW with the floor when turning. Patient instructed in gait training with RW on return to room, once in room patient appeared SOB, O2 sats taken patient seated EOB 100%. Patient sit>supine supervision level, verbal/visual cues for lateral scooting along EOB. Ended session patient supine in bed, 4 bedrails up and alarm on, all needs within reach.   Therapy Documentation Precautions:  Precautions Precautions: Fall Precaution Comments: severe dysarthria Restrictions Weight Bearing Restrictions: No General: Amount of Missed PT Time (min): 12  Minutes Missed Time Reason: Other (comment) (Pt tired and unwilling to cont to participate in therapy)   Pain: Pain Assessment Pain Assessment: No/denies pain Pain Score: 0-No pain    Locomotion : Ambulation Ambulation: Yes Ambulation/Gait Assistance: 5: Supervision Ambulation Distance (Feet): 75 Feet (50x1 and 75x1) Assistive device: Rolling walker Ambulation/Gait Assistance Details: Verbal cues for sequencing;Verbal cues for precautions/safety;Verbal cues for safe use of DME/AE Ambulation/Gait Assistance Details: Patient instructed in gait training 75'x2 and 50'x1 RW supervision level step to gait pattern with initial L fore foot contact and decreased L stance time. Verbal/visual cues for sliding of RW on floor and to not pick RW up off floor to move it.  Stairs / Additional Locomotion Stairs: Yes Stairs Assistance: 4: Min assist Stairs Assistance Details: Verbal cues for sequencing;Verbal cues for technique;Verbal cues for precautions/safety;Verbal cues for safe use of DME/AE Stairs Assistance Details (indicate cue type and reason): Patient instructed in stair negotiation 2steps with 2 handrails minA, step to gait pattern. Verbal cues for placement hands on hand rails and placement of entire L foot onto step prior to ascending next step. Stair Management Technique: Two rails Number of Stairs: 2 Height of Stairs: 7 Wheelchair Mobility Wheelchair Mobility: No   See FIM for current functional status  Therapy/Group: Individual Therapy  Doralee Albino 12/10/2012, 3:16 PM

## 2012-12-10 NOTE — Progress Notes (Signed)
At 1915 patient anxious and yelling out, PRN Xanax given. Continued to yell out for "nurse" with frequent request for  Juice. Refused teds to be removed. Went to sleep around 2330. Slept rest of night. [name] A

## 2012-12-10 NOTE — Progress Notes (Signed)
PHARMACY FOLLOW UP NOTE   Pharmacy Consult for : Coumadin Indication: Cardioembolic CVA with apical thrombus  Dosing Weight: 36.7 kg  Labs:  Lab Results  Component Value Date   INR 3.32* 12/10/2012   INR 2.74* 12/09/2012   INR 2.77* 12/08/2012   Pertinent Medications:  Scheduled:  . antiseptic oral rinse  15 mL Mouth Rinse q12n4p  . carvedilol  3.125 mg Oral BID WC  . citalopram  10 mg Oral Daily  . feeding supplement (ENSURE)  1 Container Oral TID BM  . multivitamin with minerals  1 tablet Oral Daily  . pantoprazole  40 mg Oral QHS  . QUEtiapine  25 mg Oral QHS  . ramipril  2.5 mg Oral Daily  . Warfarin - Pharmacist Dosing Inpatient   Does not apply q1800   Assessment: 67 yo female with cardioembolic CVA and apical clot is currently on Coumadin. INR supratherapeutic this morning increased from 2.74 to 3.32. No new cbc, no bleeding complications noted   Goal: INR 2-3   Plan: Hold coumadin today Monitor PT/INR tomorrow morning  Bayard Hugger, PharmD, BCPS  Clinical Pharmacist  Pager: [phone]  Thank you for allowing pharmacy to be part of this patients care team. 12/10/2012, 2:20 PM

## 2012-12-11 ENCOUNTER — Inpatient Hospital Stay (HOSPITAL_COMMUNITY): Payer: Medicare Other

## 2012-12-11 ENCOUNTER — Inpatient Hospital Stay (HOSPITAL_COMMUNITY): Payer: Medicare Other | Admitting: *Deleted

## 2012-12-11 LAB — PROTIME-INR: Prothrombin Time: 30.2 seconds — ABNORMAL HIGH (ref 11.6–15.2)

## 2012-12-11 MED ORDER — WARFARIN 0.5 MG HALF TABLET
0.5000 mg | ORAL_TABLET | Freq: Once | ORAL | Status: AC
  Administered 2012-12-11: 0.5 mg via ORAL
  Filled 2012-12-11: qty 1

## 2012-12-11 NOTE — Progress Notes (Signed)
Speech Language Pathology Final Treatment Session & Discharge Summary  Patient Details  Name: [name] MRN: [id number] Date of Birth: 1945/05/25  Today's Date: 12/11/2012 Time: 1115-1200 Time Calculation (min): 45 min  Skilled Therapeutic Intervention: Treatment focused on speech, swallow, and cognitive goals. Niece was not present for scheduled family education session, therefore SLP facilitated education with a written letter regarding current level of functioning, along with handouts. Pt performed oral motor exercises with Max cues and consumed her lunch tray of dys. 1 textures and nectar thick liquids with Mod cues for strategies. Pt demonstrated reflexive cough response x2 throughout meal.  Patient has met 4 of 5 long term goals.  Patient to discharge at overall Mod level.  Reasons goals not met: pt continues to require Max cues for intellectual and emergent awareness of speech and cognitive deficits   Clinical Impression/Discharge Summary: Pt has reached 4/5 LTGs during this admission. Pt is tolerating a dys. 1 (puree) diet with nectar thick liquids with Mod cues for safe swallowing strategies. Pt continues to present with decreased bolus cohesion, oral containment, and lingual manipulation for A/P transit, increasing her risk of aspiration with more advanced textures at this time. Pt requires Max cues for intellectual/emergent awareness of speech, swallowing, and cognitive deficits, which also impacts her ability to advance diet textures. Pt's overall intelligibility of speech has improved, and she requires Mod cues at sentence-level speech to utilize her compensatory strategies. SLP provided niece with handouts and an updated letter regarding current level of functioning, as she was not present for scheduled family education. Pt is scheduled to discharge home from the hospital with her niece providing 24/7 assistance. Pt will benefit from continued [facility] SLP services to maximize functional  communication, safe swallowing, and reduce caregiver burden.   Care Partner:  Caregiver Able to Provide Assistance: Yes  Type of Caregiver Assistance: Cognitive  Recommendation:  Home Health SLP;24 hour supervision/assistance  Rationale for SLP Follow Up: Maximize functional communication;Maximize cognitive function and independence;Maximize swallowing safety;Reduce caregiver burden   Equipment:  None recommended by SLP  Reasons for discharge: Discharged from hospital   Patient/Family Agrees with Progress Made and Goals Achieved: Yes   See FIM for current functional status  Maxcine Ham 12/11/2012, 2:05 PM  Maxcine Ham, M.A. CCC-SLP [phone]

## 2012-12-11 NOTE — Progress Notes (Signed)
Social Work Patient ID: [name], female   DOB: 11/07/1945, 67 y.o.   MRN: [id number] Niece did not show up for family education.  Contacted her and she thought pt's therapies were this afternoon.  Her Mom-pt's sister-[name] to bring her This afternoon.  Pt has therapy at 1;45pm will see if shows up for this one.  Aware pt is being discharged tomorrow, may need to do family education tomorrow.

## 2012-12-11 NOTE — Progress Notes (Signed)
PHARMACY FOLLOW UP NOTE   Pharmacy Consult for : Coumadin Indication: Cardioembolic CVA with apical thrombus  Dosing Weight: 36.7 kg  Labs:  Lab Results  Component Value Date   INR 3.02* 12/11/2012   INR 3.32* 12/10/2012   INR 2.74* 12/09/2012   Pertinent Medications:  Scheduled:  . antiseptic oral rinse  15 mL Mouth Rinse q12n4p  . carvedilol  3.125 mg Oral BID WC  . citalopram  10 mg Oral Daily  . feeding supplement (ENSURE)  1 Container Oral TID BM  . multivitamin with minerals  1 tablet Oral Daily  . pantoprazole  40 mg Oral QHS  . QUEtiapine  25 mg Oral QHS  . ramipril  2.5 mg Oral Daily  . Warfarin - Pharmacist Dosing Inpatient   Does not apply q1800   Assessment: 67 yo female with cardioembolic CVA and apical clot is currently on Coumadin. Coumadin was on hold yesterday d/t supratherapeutic INR. INR trending down to 3.02, this mroning. No new cbc, no bleeding complications noted   Goal: INR 2-3   Plan: Coumadin 0.5 mg po x 1 today Monitor PT/INR tomorrow morning  Bayard Hugger, PharmD, BCPS  Clinical Pharmacist  Pager: [phone]  Thank you for allowing pharmacy to be part of this patients care team. 12/11/2012, 2:27 PM

## 2012-12-11 NOTE — Progress Notes (Addendum)
Physical Therapy Discharge Summary  Patient Details  Name: [name] MRN: [id number] Date of Birth: [birth year]  Today's Date: 12/11/2012 Time: 1000-1057 Time Calculation (min): 57 min  Patient has met 9 of 10 long term goals due to improved activity tolerance, improved balance, improved postural control, increased strength, ability to compensate for deficits, functional use of  left upper extremity and left lower extremity, improved attention, improved awareness and improved coordination.  Patient to discharge at a wheelchair level Min Assist.   Patient's care partner unavailable secondary to not being able to attend scheduled family education session to provide the necessary physical and cognitive assistance at discharge. OT able to follow up with family members, emphasized recommendations from PT perspective about discharge.   Reasons goals not met: Stair goal not met for 1 step LRAD, patient able to complete 5 steps modA with B handrails. Patient has ramp to enter home and does not need to complete goal as written.  Recommendation:  Patient will benefit from ongoing skilled PT services in home health setting to continue to advance safe functional mobility, address ongoing impairments in activity tolerance, balance/coordination, functional use of L UE/LE, safety, awareness/attention, cognition, and minimize fall risk.  Equipment: No equipment provided  Reasons for discharge: treatment goals met and discharge from hospital  Patient/family agrees with progress made and goals achieved: Yes  Skilled Therapeutic Intervention:  Patient received seated in wheelchair at nurses station. Session focused on wheelchair mobility, functional transfers, bed mobility, gait training and stair negotiation. See details below. Patient returned to room, supine in bed 4 bedrails up and alarm on, all needs within reach. Nurse tech notified of patients request to use bathroom.   PT  Discharge Precautions/Restrictions Precautions Precautions: Fall Restrictions Weight Bearing Restrictions: No Pain Pain Assessment Pain Assessment: No/denies pain Pain Score: 0-No pain Vision/Perception  Vision - History Baseline Vision: Wears glasses only for reading Patient Visual Report: No change from baseline  Cognition Overall Cognitive Status: Difficult to assess Arousal/Alertness: Awake/alert Orientation Level: Oriented to person Attention: Focused;Sustained Focused Attention: Appears intact Sustained Attention: Appears intact Behaviors: Restless;Impulsive;Perseveration Safety/Judgment: Impaired Sensation Sensation Light Touch: Appears Intact Proprioception: Appears Intact Coordination Gross Motor Movements are Fluid and Coordinated: No Fine Motor Movements are Fluid and Coordinated: No Coordination and Movement Description: Rapid alternating movements intact, but patient demonstrates decreased speed and coordination L UE.  Heel Shin Test: Intact, no noted dysmetria. Motor  Motor Motor: Hemiplegia;Abnormal tone;Abnormal postural alignment and control (L hemiparesis) Motor - Discharge Observations: L hemiparesis and noted tone L hamstrings  Mobility Bed Mobility Bed Mobility: Rolling Right;Rolling Left;Supine to Sit;Sit to Supine Rolling Right: 5: Supervision Rolling Left: 5: Supervision Supine to Sit: 5: Supervision;HOB flat Supine to Sit Details: Verbal cues for precautions/safety Supine to Sit Details (indicate cue type and reason): Patient supine>sit, HOB flat, no bedrails. Verbal cues for instruction.  Sit to Supine: 5: Supervision;HOB flat Sit to Supine - Details: Verbal cues for precautions/safety Sit to Supine - Details (indicate cue type and reason): Patient sit>supine supervision level HOB flat no bedrails, verbal cues for instruction.  Transfers Transfers: Yes Sit to Stand: 5: Supervision;With armrests;From bed;From chair/3-in-1 Sit to Stand  Details: Manual facilitation for placement;Verbal cues for technique;Verbal cues for precautions/safety Sit to Stand Details (indicate cue type and reason): Patient sit>stand supervision level, manual facilitation for hand placement on armrests.  Stand to Sit: 5: Supervision;With armrests;To bed;To chair/3-in-1 Stand to Sit Details (indicate cue type and reason): Verbal cues for technique;Verbal cues for precautions/safety;Tactile cues  for placement Stand to Sit Details: Stand>sit supervision level, verbal cues to reach back for armrests prior to sitting, and to have bottom completely facing chair prior to sitting down.  Stand Pivot Transfers: 5: Supervision;With armrests Stand Pivot Transfer Details: Verbal cues for sequencing;Verbal cues for technique;Verbal cues for precautions/safety Stand Pivot Transfer Details (indicate cue type and reason): Patient instructed stand pivot transfer with RW supervision level. Manual facilitation for hand placement on armrests sit>stand transfer. Verbal/tactile cues to maintain RW contact with floor and not to lift it up to move it when turning. Verbal cues for turning completely prior to sitting down.  Locomotion  Ambulation Ambulation: Yes Ambulation/Gait Assistance: 5: Supervision Ambulation Distance (Feet): 50 Feet (and 25'x1) Assistive device: Rolling walker Ambulation/Gait Assistance Details: Verbal cues for sequencing;Verbal cues for technique;Verbal cues for precautions/safety;Verbal cues for safe use of DME/AE Ambulation/Gait Assistance Details: Patient instructed in gait training 50'x1 in controlled environment, with verbal cues for maintaining BOS within RW, maintaining RW contact with ground and not to lift it up to move it. Patient also performed 25'x1 in home environment, supervision level., patient able to manage RW on different surfaces (carpet, thresholds) with no LOB. Verbal cues for appropriate use of RW and maintaining BOS within  walker. Gait Gait: Yes Gait Pattern: Impaired Gait Pattern: Step-to pattern;Decreased step length - right;Decreased step length - left;Decreased stance time - left;Decreased dorsiflexion - left;Decreased weight shift to left;Trunk flexed;Lateral trunk lean to right;Narrow base of support Gait velocity: Patient demonstrates decreased gait speed.  Stairs / Additional Locomotion Stairs: Yes Stairs Assistance: 3: Mod assist Stairs Assistance Details: Verbal cues for sequencing;Verbal cues for technique;Verbal cues for precautions/safety Stairs Assistance Details (indicate cue type and reason): Patient instructed in stair negotiation 5 steps with 2 handrails step to pattern. Verbal/tactile cues given for hand placement on handrails and for advancement of L LE. Manual facilaition provided for hand placement on handrails when turning at base of steps.  Stair Management Technique: Two rails;Step to pattern Number of Stairs: 5 Height of Stairs: 6 Wheelchair Mobility Wheelchair Mobility: Yes Wheelchair Assistance: 5: Supervision Wheelchair Assistance Details: Verbal cues for technique;Verbal cues for precautions/safety;Verbal cues for safe use of DME/AE Wheelchair Propulsion: Both upper extremities;Both lower extermities Wheelchair Parts Management: Needs assistance Distance: 100'  Trunk/Postural Assessment  Cervical Assessment Cervical Assessment: Within Functional Limits Thoracic Assessment Thoracic Assessment: Within Functional Limits Lumbar Assessment Lumbar Assessment: Within Functional Limits Postural Control Postural Control: Deficits on evaluation Postural Limitations: Flexed trunk posture, Forward head posture.   Balance Balance Balance Assessed: Yes Static Sitting Balance Static Sitting - Balance Support: Feet supported Static Sitting - Level of Assistance: 5: Stand by assistance Static Standing Balance Static Standing - Balance Support: Bilateral upper extremity  supported;During functional activity Static Standing - Level of Assistance: 5: Stand by assistance Static Standing - Comment/# of Minutes: 1 Dynamic Standing Balance Dynamic Standing - Balance Support: Bilateral upper extremity supported;During functional activity Dynamic Standing - Level of Assistance: 5: Stand by assistance Extremity Assessment      RLE Assessment RLE Assessment: Within Functional Limits RLE Strength RLE Overall Strength: Within Functional Limits for tasks assessed RLE Overall Strength Comments: Grossly 4-/5 (hip flex, knee ext/flex, DF, PF) LLE Assessment LLE Assessment: Exceptions to [facility] LLE Strength LLE Overall Strength: Deficits (L hemiparesis ) LLE Overall Strength Comments: Grossly 3+/5 hip flex, knee ext/flex. DF and PF 1/5.   See FIM for current functional status  Doralee Albino 12/11/2012, 12:33 PM

## 2012-12-11 NOTE — Progress Notes (Signed)
Subjective/Complaints: 67 y.o. 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 Admitted 11/24/2012 with slurred speech. MRI of the brain showed acute infarcts affecting the left cerebellum and right posterior temporal regions. Also noted extensive encephalomalacia related to remote bihemispheric infarction. 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 Echocardiogram with ejection fraction of 15% no wall motion abnormalities and small apical thrombus. Carotid Dopplers with no ICA stenosis. Neurology services consulted. Patient did receive TPA. Findings of elevated troponin/small non-Q-wave myocardial infarction and followup cardiology services  No safety issues No pain c/os Review of Systems - Negative except using O2, denies SOB  Objective: Vital Signs: Blood pressure 114/69, pulse 50, temperature 97.8 F (36.6 C), temperature source Oral, resp. rate 18, height 5\' 1"  (1.549 m), weight 36.651 kg (80 lb 12.8 oz), SpO2 99.00%. No results found. Results for orders placed during the hospital encounter of 11/29/12 (from the past 72 hour(s))  PROTIME-INR     Status: Abnormal   Collection Time    12/09/12  8:45 AM      Result Value Range   Prothrombin Time 28.1 (*) 11.6 - 15.2 seconds   INR 2.74 (*) 0.00 - 1.49  PROTIME-INR     Status: Abnormal   Collection Time    12/10/12  6:00 AM      Result Value Range   Prothrombin Time 32.5 (*) 11.6 - 15.2 seconds   INR 3.32 (*) 0.00 - 1.49  PROTIME-INR     Status: Abnormal   Collection Time    12/11/12  3:35 AM      Result Value Range   Prothrombin Time 30.2 (*) 11.6 - 15.2 seconds   INR 3.02 (*) 0.00 - 1.49     HEENT: normal Cardio: RRR and no murmurs Resp: CTA B/L GI:  BS positive and non distended Extremity:  Pulses positive Skin:   Intact Neuro: Confused, Cranial Nerve II-XII normal, Abnormal Sensory unable to assess due to dysarthria-severe, Abnormal Motor 4/5 in BUE and BLE and Dysarthric,  Musc/Skel:  Normal Gen NAD   Assessment/Plan: 1. Functional deficits secondary to Left cerebellar infarct which require 3+ hours per day of interdisciplinary therapy in a comprehensive inpatient rehab setting. Physiatrist is providing close team supervision and 24 hour management of active medical problems listed below. Physiatrist and rehab team continue to assess barriers to discharge/monitor patient progress toward functional and medical goals. FIM: FIM - Bathing Bathing Steps Patient Completed: Chest;Right Arm;Left Arm;Abdomen;Front perineal area;Right upper leg;Left upper leg;Buttocks;Right lower leg (including foot) Bathing: 4: Steadying assist  FIM - Upper Body Dressing/Undressing Upper body dressing/undressing steps patient completed: Thread/unthread right sleeve of pullover shirt/dresss;Pull shirt over trunk;Put head through opening of pull over shirt/dress;Thread/unthread left sleeve of pullover shirt/dress Upper body dressing/undressing: 4: Min-Patient completed 75 plus % of tasks FIM - Lower Body Dressing/Undressing Lower body dressing/undressing steps patient completed: Thread/unthread right pants leg;Don/Doff right shoe;Don/Doff left shoe;Thread/unthread left underwear leg;Thread/unthread right underwear leg;Pull underwear up/down;Thread/unthread left pants leg;Pull pants up/down Lower body dressing/undressing: 4: Steadying Assist  FIM - Toileting Toileting steps completed by patient: Performs perineal hygiene Toileting Assistive Devices: Grab bar or rail for support Toileting: 2: Max-Patient completed 1 of 3 steps  FIM - Diplomatic Services operational officer Devices: Grab bars Toilet Transfers: 4-From toilet/BSC: Min  A (steadying Pt. >  75%);4-To toilet/BSC: Min A (steadying Pt. > 75%)  FIM - Bed/Chair Transfer Bed/Chair Transfer Assistive Devices: Arm rests Bed/Chair Transfer: 5: Sit > Supine: Supervision (verbal cues/safety issues);4: Bed > Chair or W/C: Min A (steadying Pt. > 75%);4: Chair or W/C > Bed: Min A (steadying Pt. > 75%)  FIM - Locomotion: Wheelchair Distance: 75 Locomotion: Wheelchair: 0: Activity did not occur FIM - Locomotion: Ambulation Locomotion: Ambulation Assistive Devices: Designer, industrial/product Ambulation/Gait Assistance: 5: Supervision Locomotion: Ambulation: 2: Travels 50 - 149 ft with supervision/safety issues  Comprehension Comprehension Mode: Auditory Comprehension: 4-Understands basic 75 - 89% of the time/requires cueing 10 - 24% of the time  Expression Expression Mode: Verbal Expression: 3-Expresses basic 50 - 74% of the time/requires cueing 25 - 50% of the time. Needs to repeat parts of sentences.  Social Interaction Social Interaction Mode: Asleep Social Interaction: 4-Interacts appropriately 75 - 89% of the time - Needs redirection for appropriate language or to initiate interaction.  Problem Solving Problem Solving: 2-Solves basic 25 - 49% of the time - needs direction more than half the time to initiate, plan or complete simple activities  Memory Memory Mode: Asleep Memory: 1-Recognizes or recalls less than 25% of the time/requires cueing greater than 75% of the time   Medical Problem List and Plan:  1. 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 DVT Prophylaxis/Anticoagulation: Coumadin therapy. Monitor for any bleeding episodes. 3. Pain Management: Tylenol as needed  4. Dysphagia. Dysphagia 1 nectar liquids. Followup speech therapy. Monitor for any signs of aspiration  5. Neuropsych: This patient is not capable of making decisions on her own behalf.  6. Small non-Q wave myocardial infarction/severe nonischemic cardiomyopathy/apical thrombus. Continue conservative  care and maintain Coumadin therapy. Followup cardiology services  7. Hypertension. Altace 2.5 mg daily, Coreg 3.125 mg twice a day. Monitor with increased mobility  8. History of polysubstance abuse.  9.  COPD on 2l Lerna , will try to wean, Sats have been 95-100%   LOS (Days) 12 A FACE TO FACE EVALUATION WAS PERFORMED  KIRSTEINS,ANDREW E 12/11/2012, 8:41 AM

## 2012-12-11 NOTE — Progress Notes (Signed)
I have reviewed and agree with the treatment as reflected in this note. Yeray Tomas, PT DPT  

## 2012-12-11 NOTE — Progress Notes (Signed)
Occupational Therapy Discharge Summary  Patient Details  Name: [name] MRN: [id number] Date of Birth: 04-11-1945  Today's Date: 12/11/2012 Time: 2130-8657 and 8469-6295 Time Calculation (min): 60 min and 25 min   Patient has met 10 of 10 long term goals due to improved activity tolerance, improved balance, postural control and ability to compensate for deficits.  Patient to discharge at [facility] Assist level.  Patient's care partner is independent to provide the necessary physical and cognitive assistance at discharge.  Patient's sister was present at end of one therapy session and discussed patient's current functional status as compared to prior level of function. This sister's daughter is the patient's caregiver. Per report she is able to provide the 24/7 supervision and min assistance required for the patient. Scheduled family education however family did not attend, however, sister and niece/caregiver Bjorn Loser) arrived after therapy was completed for the day. Therapist discussed with patient's current functional status and recommendations at this time. Emphasized 24/7 supervision and min assist with functional transfers and all self-care tasks requiring standing. Educated on hand placement during transfers and safety for both caregiver and the patient. Bjorn Loser reported feeling confident about providing assistance from a self-care perspective.   Reasons goals not met: N/A. All goals met.  Recommendation:  Patient will benefit from ongoing skilled OT services in home health setting to continue to advance functional skills in the area of BADL.  Equipment: No equipment provided  Reasons for discharge: treatment goals met  Patient/family agrees with progress made and goals achieved: Yes  Skilled Therapeutic Intervention Session 1: Therapy session focused on ADL retraining, functional transfers, dynamic standing balance, and functional ambulation. Pt received at nurses station. Pt  ambulated approx 8 feet from w/c>toilet with RW and min-supervision. Required steadying assist for clothing management during toilet task. Pt then ambulated to walk-in shower with min assist. Completed bathing with min verbal cues and steadying assist when standing for peri hygiene. Pt completed dressing while sitting in w/c. Required min assist to hook bra and steadying assist when managing clothing around waist. Pt completed several sit<>stands throughout therapy session requiring min-supervision. Pt anxious when in standing and requires steadying for reassurance. Pt required verbal cues to use LLE during functional ambulation and transfers. Pt left sitting in w/c at nurses station.   Session 2: Pt received supine in bed and required encouragement to get up from bed. Completed supine>sit with supervision then ambulated to w/c placed across room using RW with close supervision. Pt ambulated from ADL apartment to TTB approx 15 feet with supervision using RW. Practiced tub transfer using TTB with min/steadying assist and increased time. Pt then ambulated back to w/c. Pt returned to room and transferred to bed at supervision level. Pt left with bed alarm on and call light in reach. Family education was suppose to be completed during therapy session however family did not show up.    OT Discharge Precautions/Restrictions    General   Vital Signs Therapy Vitals Temp: 97.8 F (36.6 C) Temp src: Oral Pulse Rate: 50 Resp: 18 BP: 114/69 mmHg Oxygen Therapy SpO2: 99 % O2 Device: Nasal cannula O2 Flow Rate (L/min): 2 L/min Pain   ADL   Vision/Perception     Cognition   Sensation   Motor    Mobility     Trunk/Postural Assessment     Balance   Extremity/Trunk Assessment      See FIM for current functional status  Lucretia Pendley N 12/11/2012, 9:41 AM

## 2012-12-11 NOTE — Discharge Summary (Signed)
Discharge summary job [id number]

## 2012-12-11 NOTE — Plan of Care (Signed)
Problem: RH Stairs Goal: LTG Patient will ambulate up and down stairs w/assist (PT) LTG: Patient will ambulate up and down # of stairs with assistance (PT)  Outcome: Not Met (add Reason) Patient completed stair training 5 steps with B UE support on handrails. Secondary to discussion with family patient has ramp into home and does not need to complete stairs as goal is written.

## 2012-12-11 NOTE — Progress Notes (Signed)
I have reviewed and agree with the treatment as reflected in this note. Kieran Nachtigal, PT DPT  

## 2012-12-11 NOTE — Progress Notes (Signed)
Social Work Patient ID: [name], female   DOB: 06/30/1945, 67 y.o.   MRN: [id number] Niece and sister here and have spoken with OT-Kayla and will try to see if [name] can speak with them due to pt's diet restrictions. Pt is smiling and happy to be going home soon.  She feels she will eat better and wants to see her dog that she misses.  Niece reports: " Her dog is missing her and  Looking all over the house for her."  Both feel they are aware of pt's diet and are capable of providing the care pt requires.  Aware discharge tomorrow and time of 11;00.

## 2012-12-11 NOTE — Progress Notes (Signed)
Social Work Patient ID: [name], female   DOB: 03/30/1945, 67 y.o.   MRN: [id number] Family did not show up for pt's afternoon session.  Will see if coming tomorrow to try to do 30 minutes of family education prior to discharge. Pt unsure where they are.  Have spoken with Rhonda-niece today and they were trying to make it in, but have not heard from her since this am. Hopefully they will show up tomorrow for pt's discharge.

## 2012-12-11 NOTE — Discharge Summary (Signed)
[name], Clark NO.:  [id number]  MEDICAL RECORD NO.:  [id number]  LOCATION:  [id number]                        FACILITY:  [facility]  PHYSICIAN:  Erick Colace, M.D.DATE OF BIRTH:  May 19, 1945  DATE OF ADMISSION:  11/29/2012 DATE OF DISCHARGE:                              DISCHARGE SUMMARY   DISCHARGE DIAGNOSES: 1. 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 2. Chronic Coumadin therapy. 3. Dysphagia. 4. Small non-Q-wave myocardial infarction with apical thrombus. 5. Hypertension. 6. History of polysubstance abuse.  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 Admitted November 24, 2012, with slurred speech.  MRI of the brain showed acute infarcts affecting the left cerebellum and right posterior temporal regions.  Also noted extensive encephalomalacia related to remote bihemispheric infarction.  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  Echocardiogram with ejection fraction of 15%.  No wall motion abnormalities and small apical thrombus.  Carotid Dopplers with no ICA stenosis.  Neurology Service is consulted.  The patient did receive t- PA.  Findings of elevated troponin, small non-Q-wave myocardial infarction.  Follow up Cardiology Services, placed on Coumadin therapy for cardioembolic CVA, as well as apical thrombus.  Conservative medical management per Cardiology Services.  Admission urine drug screen negative.  The patient was maintained on a dysphagia 1 nectar thick liquid diet.  Physical and occupational therapy ongoing.  The patient was admitted for a comprehensive rehab program.  PAST MEDICAL HISTORY:  See discharge diagnoses.  SOCIAL  HISTORY:  Lives with niece.  FUNCTIONAL HISTORY:  Prior to admission used a rolling walker.  FUNCTIONAL STATUS:  Prior to admission was moderate assist, ambulate 3 feet with a rolling walker.  PHYSICAL EXAMINATION:  VITAL SIGNS:  Blood pressure 141/82, pulse 51, temperature 98, respirations 20. GENERAL:  This was an alert female, somewhat anxious. HEENT:  Pupils round and reactive to light.  Speech was significantly dysarthric. LUNGS:  Clear to auscultation. CARDIAC:  Regular rate and rhythm. ABDOMEN:  Soft, nontender.  Good bowel sounds.  REHABILITATION HOSPITAL COURSE:  The patient was admitted to inpatient rehab services with therapies initiated on a 3-hour daily basis consisting of physical therapy, occupational therapy, speech therapy, and rehabilitation nursing.  The following issues were addressed during the patient's rehabilitation stay.  Pertaining to [name]'s embolic CVA remained stable.  She continued on chronic Coumadin therapy and would follow up Neurology Services.  There were no bleeding episodes. Goal INR of 2.0-3.0 to be followed by Dr. Sharyn Lull at [phone].  Diet was currently a dysphagia 1 nectar thick liquid diet.  No signs of aspiration and monitored closely.  She had no chest pain or shortness of breath, with follow up per Cardiology Services with findings of apical thrombus, severe nonischemic cardiomyopathy felt to be related to history of cocaine abuse.  Blood pressures controlled on Altace as well as Coreg.  She did have a history as noted of polysubstance abuse. Urine  drug screen was negative.  The patient with bouts of anxiety and restlessness during her hospital stay.  She was placed on low-dose Seroquel 25 mg at bedtime and monitored with good results.  The patient received weekly collaborative interdisciplinary team conferences to discuss estimated length of stay, family teaching, and any barriers to discharge.  She could roll on the mat minimal  assist.  She demonstrated decreased stance time on the left lower extremity, needing verbal cues. She demonstrated difficulty with managing hand, placement to initiate transfers upon standing.  She was instructed in gait training with a rolling walker.  She was ambulating 8-10 feet with minimal assistance. Needing some assistance for lower body, activities of daily living. Full family teaching was completed and it was stressed the need for 24- hour supervision for the patient's safety due to limited awareness. Ongoing therapies were arranged as per rehab services.  DISCHARGE MEDICATIONS: 1. Xanax 0.25 mg p.o. every 4 hours as needed. 2. Coreg 3.125 mg p.o. b.i.d. 3. Celexa 10 mg p.o. daily. 4. Multivitamin 1 tablet daily. 5. Protonix 40 mg p.o. bedtime. 6. Seroquel 25 mg p.o. at bedtime. 7. Altace 2.5 mg p.o. daily. 8. Coumadin daily with a goal INR of 2.0-3.0.  Her diet was a     dysphagia 1 nectar thick liquid diet.  SPECIAL INSTRUCTIONS:  The patient would follow up Dr. Claudette Laws at the outpatient rehab center on January 11, 2013, Dr. Delia Heady, Neurology Service in 1 month call for appointment, Dr. Sharyn Lull December 17, 2012, for home health nurse to draw INR with results to Dr. Sharyn Lull, fax number [phone].  She would also follow up medically with Dr. Sharyn Lull, December 18, 2012, medical cardiology care.  Ongoing therapies were arranged as per rehab services.     Mariam Dollar, P.A.   ______________________________ Erick Colace, M.D.    DA/MEDQ  D:  12/11/2012  T:  12/11/2012  Job:  [id number]  cc:   Erick Colace, M.D. Pramod P. Pearlean Brownie, MD Eduardo Osier Sharyn Lull, M.D.

## 2012-12-12 MED ORDER — CITALOPRAM HYDROBROMIDE 10 MG PO TABS: 10.0000 mg | ORAL_TABLET | Freq: Every day | ORAL | Status: DC

## 2012-12-12 MED ORDER — WARFARIN SODIUM 2 MG PO TABS: 2.0000 mg | ORAL_TABLET | Freq: Every day | ORAL | Status: DC

## 2012-12-12 MED ORDER — ADULT MULTIVITAMIN W/MINERALS CH: 1.0000 | ORAL_TABLET | Freq: Every day | ORAL | Status: AC

## 2012-12-12 MED ORDER — CARVEDILOL 3.125 MG PO TABS: 3.1250 mg | ORAL_TABLET | Freq: Two times a day (BID) | ORAL | Status: DC

## 2012-12-12 MED ORDER — WARFARIN SODIUM 2 MG PO TABS
2.0000 mg | ORAL_TABLET | Freq: Once | ORAL | Status: DC
  Filled 2012-12-12: qty 1

## 2012-12-12 MED ORDER — QUETIAPINE FUMARATE 25 MG PO TABS: 25.0000 mg | ORAL_TABLET | Freq: Every day | ORAL | Status: DC

## 2012-12-12 MED ORDER — ALPRAZOLAM 0.25 MG PO TABS: 0.2500 mg | ORAL_TABLET | ORAL | Status: DC | PRN

## 2012-12-12 MED ORDER — NITROGLYCERIN 0.4 MG SL SUBL: 0.4000 mg | SUBLINGUAL_TABLET | SUBLINGUAL | Status: AC | PRN

## 2012-12-12 MED ORDER — RAMIPRIL 2.5 MG PO CAPS: 2.5000 mg | ORAL_CAPSULE | Freq: Every day | ORAL | Status: DC

## 2012-12-12 NOTE — Progress Notes (Signed)
PHARMACY FOLLOW UP NOTE   Pharmacy Consult for : Coumadin Indication: Non-Q wave MI with apical thrombus, CVA  Dosing Weight: 39.7 kg  Labs:  Recent Labs  12/10/12 0600 12/11/12 0335 12/12/12 0540  LABPROT 32.5* 30.2* 22.8*  INR 3.32* 3.02* 2.09*   Lab Results  Component Value Date   INR 2.09* 12/12/2012   INR 3.02* 12/11/2012   INR 3.32* 12/10/2012    Estimated Creatinine Clearance: 37.6 ml/min (by C-G formula based on Cr of 0.91).  Pertinent Medications:  Scheduled:  . antiseptic oral rinse  15 mL Mouth Rinse q12n4p  . carvedilol  3.125 mg Oral BID WC  . citalopram  10 mg Oral Daily  . feeding supplement (ENSURE)  1 Container Oral TID BM  . multivitamin with minerals  1 tablet Oral Daily  . pantoprazole  40 mg Oral QHS  . QUEtiapine  25 mg Oral QHS  . ramipril  2.5 mg Oral Daily  . Warfarin - Pharmacist Dosing Inpatient   Does not apply q1800    Assessment:  67 y/o female on Coumadin for a Non-Q wave MI with apical thrombus and CVA.  INR has dropped significantly but remains therapeutic 2.09. No bleeding complications noted   Goal:  INR 2-3   Plan: 1. Coumadin 2 mg today. 2. Daily INR's.  CBC with AM labs.  Monitor for bleeding complications.    Slade Pierpoint, Deetta Perla.D 12/12/2012, 2:16 PM

## 2012-12-12 NOTE — Progress Notes (Signed)
Patient discharged home with family.  Patient and family verbalized understanding of all discharge instructions, no questions asked.  Left floor via wheelchair, escorted by nursing staff and family.  All patient belongings sent with family.  Patient appears to be in no immediate distress at this time. Dani Gobble, RN

## 2012-12-12 NOTE — Progress Notes (Signed)
Subjective/Complaints: 67 y.o. 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 Admitted 11/24/2012 with slurred speech. MRI of the brain showed acute infarcts affecting the left cerebellum and right posterior temporal regions. Also noted extensive encephalomalacia related to remote bihemispheric infarction. 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 Echocardiogram with ejection fraction of 15% no wall motion abnormalities and small apical thrombus. Carotid Dopplers with no ICA stenosis. Neurology services consulted. Patient did receive TPA. Findings of elevated troponin/small non-Q-wave myocardial infarction and followup cardiology services  Tired, does not seem to be aware of discharge today Review of Systems - Negative except using O2, denies SOB  Objective: Vital Signs: Blood pressure 100/73, pulse 45, temperature 98.3 F (36.8 C), temperature source Oral, resp. rate 19, height 5\' 1"  (1.549 m), weight 36.651 kg (80 lb 12.8 oz), SpO2 99.00%. No results found. Results for orders placed during the hospital encounter of 11/29/12 (from the past 72 hour(s))  PROTIME-INR     Status: Abnormal   Collection Time    12/10/12  6:00 AM      Result Value Range   Prothrombin Time 32.5 (*) 11.6 - 15.2 seconds   INR 3.32 (*) 0.00 - 1.49  PROTIME-INR     Status: Abnormal   Collection Time    12/11/12  3:35 AM      Result Value Range   Prothrombin Time 30.2 (*) 11.6 - 15.2 seconds   INR 3.02 (*) 0.00 - 1.49  PROTIME-INR     Status: Abnormal   Collection Time    12/12/12  5:40 AM      Result Value Range   Prothrombin Time 22.8 (*) 11.6 - 15.2 seconds   INR 2.09 (*) 0.00 - 1.49     HEENT: normal Cardio: RRR and no  murmurs Resp: CTA B/L GI: BS positive and non distended Extremity:  Pulses positive Skin:   Intact Neuro: Confused, Cranial Nerve II-XII normal, Abnormal Sensory unable to assess due to dysarthria-severe, Abnormal Motor 4/5 in BUE and BLE and Dysarthric,  Musc/Skel:  Normal Gen NAD   Assessment/Plan: 1. Functional deficits secondary to Left cerebellar infarct which require 3+ hours per day of interdisciplinary therapy in a comprehensive inpatient rehab setting. Stable for D/C today F/u PCP in 1-2 weeks F/u PM&R 3 weeks See D/C summary See D/C instructions FIM: FIM - Bathing Bathing Steps Patient Completed: Chest;Right Arm;Left Arm;Abdomen;Front perineal area;Right upper leg;Left upper leg;Buttocks;Right lower leg (including foot);Left lower leg (including foot) Bathing: 4: Steadying assist  FIM - Upper Body Dressing/Undressing Upper body dressing/undressing steps patient completed: Thread/unthread right sleeve of pullover shirt/dresss;Pull shirt over trunk;Put head through opening of pull over shirt/dress;Thread/unthread left sleeve of pullover shirt/dress;Thread/unthread left bra strap;Thread/unthread right bra strap Upper body dressing/undressing: 4: Min-Patient completed 75 plus % of tasks FIM - Lower Body Dressing/Undressing Lower body dressing/undressing steps patient completed: Thread/unthread right pants leg;Don/Doff right shoe;Don/Doff left shoe;Thread/unthread left underwear leg;Thread/unthread right underwear leg;Pull underwear up/down;Thread/unthread left pants leg;Pull pants up/down Lower body dressing/undressing: 4: Steadying Assist  FIM - Toileting Toileting steps completed by patient: Performs perineal hygiene Toileting Assistive Devices: Grab bar or rail for support Toileting: 4: Steadying assist  FIM - Diplomatic Services operational officer Devices: Art gallery manager Transfers: 1-From toilet/BSC: Total A (helper does all/Pt. <  25%);1-To toilet/BSC: Total A  (helper does all/Pt. < 25%)  FIM - Bed/Chair Transfer Bed/Chair Transfer Assistive Devices: Arm rests;Walker (RW) Bed/Chair Transfer: 2: Chair or W/C > Bed: Max A (lift and lower assist);2: Bed > Chair or W/C: Max A (lift and lower assist)  FIM - Locomotion: Wheelchair Distance: 100' Locomotion: Wheelchair: 2: Travels 50 - 149 ft with supervision, cueing or coaxing FIM - Locomotion: Ambulation Locomotion: Ambulation Assistive Devices: Designer, industrial/product Ambulation/Gait Assistance: 5: Supervision Locomotion: Ambulation: 2: Travels 50 - 149 ft with supervision/safety issues  Comprehension Comprehension Mode: Auditory Comprehension: 4-Understands basic 75 - 89% of the time/requires cueing 10 - 24% of the time  Expression Expression Mode: Verbal Expression: 3-Expresses basic 50 - 74% of the time/requires cueing 25 - 50% of the time. Needs to repeat parts of sentences.  Social Interaction Social Interaction Mode: Asleep Social Interaction: 4-Interacts appropriately 75 - 89% of the time - Needs redirection for appropriate language or to initiate interaction.  Problem Solving Problem Solving: 2-Solves basic 25 - 49% of the time - needs direction more than half the time to initiate, plan or complete simple activities  Memory Memory Mode: Asleep Memory: 1-Recognizes or recalls less than 25% of the time/requires cueing greater than 75% of the time   Medical Problem List and Plan:  1. 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 DVT Prophylaxis/Anticoagulation: Coumadin therapy. Monitor for any bleeding episodes. 3. Pain Management: Tylenol as needed  4. Dysphagia. Dysphagia 1 nectar liquids. Followup speech therapy. Monitor for any signs of aspiration  5. Neuropsych: This patient is not capable of making decisions on her own behalf.  6. Small non-Q wave myocardial infarction/severe nonischemic cardiomyopathy/apical thrombus. Continue conservative care and maintain Coumadin  therapy. Followup cardiology services  7. Hypertension. Altace 2.5 mg daily, Coreg 3.125 mg twice a day. Monitor with increased mobility  8. History of polysubstance abuse.  9.  COPD occ use of O2 per , ? If desats during exercise are documented   LOS (Days) 13 A FACE TO FACE EVALUATION WAS PERFORMED  Clem Wisenbaker E 12/12/2012, 10:20 AM

## 2012-12-12 NOTE — Progress Notes (Signed)
Social Work Discharge Note Discharge Note  The overall goal for the admission was met for:   Discharge location: Yes-HOME WITH NIECE-RHONDA WHO WAS HER CAREGIVER PRIOR TO ADMISSION  Length of Stay: Yes-13 DAYS  Discharge activity level: Yes-SUPERVISION/MIN LEVEL  Home/community participation: Yes  Services provided included: MD, RD, PT, OT, SLP, RN, Pharmacy and SW  Financial Services: Medicare and Medicaid  Follow-up services arranged: Home Health: [facility]-PT,OT,SW,RN and Patient/Family request agency HH: [facility]REF [facility]FORE, DME: HAS DME ALREADY  Comments (or additional information):LIMITED FAMILY EDUCATION DUE TO DID NOT SHOW UP DURING THERAPIES. DID GET SOME WHEN WERE HERE YESTERDAY FOR 30-45 MINUTES  Patient/Family verbalized understanding of follow-up arrangements: Yes  Individual responsible for coordination of the follow-up plan: LILLIE-SISTER & RHONDA-NIECE  Confirmed correct DME delivered: Lucy Chris 12/12/2012    Lucy Chris

## 2012-12-12 NOTE — Progress Notes (Signed)
Social Work Patient ID: [name], female   DOB: July 24, 1945, 67 y.o.   MRN: [id number] Spoke with Lillie-sister to find out when coming to pick up pt.  She reports car trouble getting pt's other sister to come and pick up pt.   They are aware she is being discharged today.

## 2012-12-15 ENCOUNTER — Encounter (HOSPITAL_COMMUNITY): Payer: Self-pay | Admitting: Emergency Medicine

## 2012-12-15 ENCOUNTER — Emergency Department (HOSPITAL_COMMUNITY): Payer: Medicare Other

## 2012-12-15 ENCOUNTER — Inpatient Hospital Stay (HOSPITAL_COMMUNITY): Payer: Medicare Other

## 2012-12-15 ENCOUNTER — Inpatient Hospital Stay (HOSPITAL_COMMUNITY)
Admission: EM | Admit: 2012-12-15 | Discharge: 2012-12-20 | DRG: 064 | Disposition: A | Discharge status: Skilled Nursing Facility | Payer: Medicare Other | Attending: Internal Medicine | Admitting: Internal Medicine

## 2012-12-15 DIAGNOSIS — N179 Acute kidney failure, unspecified: Secondary | ICD-10-CM | POA: Diagnosis present

## 2012-12-15 DIAGNOSIS — I69922 Dysarthria following unspecified cerebrovascular disease: Secondary | ICD-10-CM

## 2012-12-15 DIAGNOSIS — F0390 Unspecified dementia without behavioral disturbance: Secondary | ICD-10-CM | POA: Diagnosis present

## 2012-12-15 DIAGNOSIS — I6340 Cerebral infarction due to embolism of unspecified cerebral artery: Principal | ICD-10-CM | POA: Diagnosis present

## 2012-12-15 DIAGNOSIS — R634 Abnormal weight loss: Secondary | ICD-10-CM | POA: Diagnosis present

## 2012-12-15 DIAGNOSIS — I639 Cerebral infarction, unspecified: Secondary | ICD-10-CM

## 2012-12-15 DIAGNOSIS — J45909 Unspecified asthma, uncomplicated: Secondary | ICD-10-CM

## 2012-12-15 DIAGNOSIS — G459 Transient cerebral ischemic attack, unspecified: Secondary | ICD-10-CM

## 2012-12-15 DIAGNOSIS — Z853 Personal history of malignant neoplasm of breast: Secondary | ICD-10-CM

## 2012-12-15 DIAGNOSIS — R4181 Age-related cognitive decline: Secondary | ICD-10-CM | POA: Diagnosis present

## 2012-12-15 DIAGNOSIS — G4733 Obstructive sleep apnea (adult) (pediatric): Secondary | ICD-10-CM | POA: Diagnosis present

## 2012-12-15 DIAGNOSIS — J449 Chronic obstructive pulmonary disease, unspecified: Secondary | ICD-10-CM | POA: Diagnosis present

## 2012-12-15 DIAGNOSIS — R64 Cachexia: Secondary | ICD-10-CM | POA: Diagnosis present

## 2012-12-15 DIAGNOSIS — K5900 Constipation, unspecified: Secondary | ICD-10-CM | POA: Diagnosis present

## 2012-12-15 DIAGNOSIS — J4489 Other specified chronic obstructive pulmonary disease: Secondary | ICD-10-CM | POA: Diagnosis present

## 2012-12-15 DIAGNOSIS — F209 Schizophrenia, unspecified: Secondary | ICD-10-CM | POA: Diagnosis present

## 2012-12-15 DIAGNOSIS — E46 Unspecified protein-calorie malnutrition: Secondary | ICD-10-CM | POA: Diagnosis present

## 2012-12-15 DIAGNOSIS — I252 Old myocardial infarction: Secondary | ICD-10-CM

## 2012-12-15 DIAGNOSIS — I214 Non-ST elevation (NSTEMI) myocardial infarction: Secondary | ICD-10-CM | POA: Diagnosis present

## 2012-12-15 DIAGNOSIS — I10 Essential (primary) hypertension: Secondary | ICD-10-CM | POA: Diagnosis present

## 2012-12-15 DIAGNOSIS — Z803 Family history of malignant neoplasm of breast: Secondary | ICD-10-CM

## 2012-12-15 DIAGNOSIS — R791 Abnormal coagulation profile: Secondary | ICD-10-CM | POA: Diagnosis present

## 2012-12-15 DIAGNOSIS — I6990 Unspecified sequelae of unspecified cerebrovascular disease: Secondary | ICD-10-CM

## 2012-12-15 DIAGNOSIS — I428 Other cardiomyopathies: Secondary | ICD-10-CM | POA: Diagnosis present

## 2012-12-15 DIAGNOSIS — Z7901 Long term (current) use of anticoagulants: Secondary | ICD-10-CM

## 2012-12-15 DIAGNOSIS — E785 Hyperlipidemia, unspecified: Secondary | ICD-10-CM | POA: Diagnosis present

## 2012-12-15 DIAGNOSIS — Z87891 Personal history of nicotine dependence: Secondary | ICD-10-CM

## 2012-12-15 DIAGNOSIS — I5022 Chronic systolic (congestive) heart failure: Secondary | ICD-10-CM | POA: Diagnosis present

## 2012-12-15 DIAGNOSIS — F1910 Other psychoactive substance abuse, uncomplicated: Secondary | ICD-10-CM | POA: Diagnosis present

## 2012-12-15 DIAGNOSIS — R5381 Other malaise: Secondary | ICD-10-CM | POA: Diagnosis present

## 2012-12-15 DIAGNOSIS — I509 Heart failure, unspecified: Secondary | ICD-10-CM | POA: Diagnosis present

## 2012-12-15 DIAGNOSIS — I2510 Atherosclerotic heart disease of native coronary artery without angina pectoris: Secondary | ICD-10-CM | POA: Diagnosis present

## 2012-12-15 DIAGNOSIS — I959 Hypotension, unspecified: Secondary | ICD-10-CM | POA: Diagnosis not present

## 2012-12-15 LAB — POCT I-STAT, CHEM 8
BUN: 19 mg/dL (ref 6–23)
Creatinine, Ser: 1.5 mg/dL — ABNORMAL HIGH (ref 0.50–1.10)
HCT: 43 % (ref 36.0–46.0)
Potassium: 3.9 mEq/L (ref 3.5–5.1)
Sodium: 144 mEq/L (ref 135–145)
TCO2: 25 mmol/L (ref 0–100)

## 2012-12-15 LAB — COMPREHENSIVE METABOLIC PANEL
AST: 24 U/L (ref 0–37)
Alkaline Phosphatase: 84 U/L (ref 39–117)
BUN: 19 mg/dL (ref 6–23)
CO2: 26 mEq/L (ref 19–32)
Calcium: 10 mg/dL (ref 8.4–10.5)
Chloride: 104 mEq/L (ref 96–112)
Creatinine, Ser: 1.24 mg/dL — ABNORMAL HIGH (ref 0.50–1.10)
GFR calc Af Amer: 51 mL/min — ABNORMAL LOW (ref >90)
GFR calc non Af Amer: 44 mL/min — ABNORMAL LOW (ref >90)
Glucose, Bld: 85 mg/dL (ref 70–99)
Potassium: 3.8 mEq/L (ref 3.5–5.1)
Total Bilirubin: 0.8 mg/dL (ref 0.3–1.2)

## 2012-12-15 LAB — GLUCOSE, CAPILLARY: Glucose-Capillary: 101 mg/dL — ABNORMAL HIGH (ref 70–99)

## 2012-12-15 LAB — DIFFERENTIAL
Eosinophils Absolute: 0 10*3/uL (ref 0.0–0.7)
Eosinophils Relative: 1 % (ref 0–5)
Lymphs Abs: 1 10*3/uL (ref 0.7–4.0)
Monocytes Absolute: 0.5 10*3/uL (ref 0.1–1.0)
Monocytes Relative: 9 % (ref 3–12)
Neutro Abs: 4.2 10*3/uL (ref 1.7–7.7)
Neutrophils Relative %: 73 % (ref 43–77)

## 2012-12-15 LAB — POCT I-STAT TROPONIN I: Troponin i, poc: 0.05 ng/mL (ref 0.00–0.08)

## 2012-12-15 LAB — PROTIME-INR: INR: 1.14 (ref 0.00–1.49)

## 2012-12-15 LAB — APTT: aPTT: 34 seconds (ref 24–37)

## 2012-12-15 LAB — CBC
HCT: 42.5 % (ref 36.0–46.0)
Hemoglobin: 14.3 g/dL (ref 12.0–15.0)
MCH: 31.1 pg (ref 26.0–34.0)
MCHC: 33.6 g/dL (ref 30.0–36.0)
RBC: 4.6 MIL/uL (ref 3.87–5.11)
WBC: 5.7 10*3/uL (ref 4.0–10.5)

## 2012-12-15 LAB — TROPONIN I: Troponin I: <0.3 ng/mL (ref <0.30)

## 2012-12-15 MED ORDER — NITROGLYCERIN 0.4 MG SL SUBL: 0.4000 mg | SUBLINGUAL_TABLET | SUBLINGUAL | Status: DC | PRN

## 2012-12-15 MED ORDER — BUDESONIDE-FORMOTEROL FUMARATE 80-4.5 MCG/ACT IN AERO
2.0000 | INHALATION_SPRAY | Freq: Two times a day (BID) | RESPIRATORY_TRACT | Status: DC
  Administered 2012-12-15 – 2012-12-19 (×9): 2 via RESPIRATORY_TRACT
  Filled 2012-12-15: qty 6.9

## 2012-12-15 MED ORDER — WARFARIN SODIUM 2 MG PO TABS: 2.0000 mg | ORAL_TABLET | Freq: Every day | ORAL | Status: DC

## 2012-12-15 MED ORDER — ALPRAZOLAM 0.25 MG PO TABS: 0.2500 mg | ORAL_TABLET | ORAL | Status: DC | PRN

## 2012-12-15 MED ORDER — SODIUM CHLORIDE 0.9 % IJ SOLN
3.0000 mL | Freq: Two times a day (BID) | INTRAMUSCULAR | Status: DC
  Administered 2012-12-15 – 2012-12-19 (×6): 3 mL via INTRAVENOUS

## 2012-12-15 MED ORDER — SODIUM CHLORIDE 0.9 % IJ SOLN: 3.0000 mL | INTRAMUSCULAR | Status: DC | PRN

## 2012-12-15 MED ORDER — WARFARIN SODIUM 3 MG PO TABS
3.0000 mg | ORAL_TABLET | ORAL | Status: DC
  Filled 2012-12-15: qty 1

## 2012-12-15 MED ORDER — SENNOSIDES-DOCUSATE SODIUM 8.6-50 MG PO TABS
1.0000 | ORAL_TABLET | Freq: Every evening | ORAL | Status: DC | PRN
  Filled 2012-12-15: qty 1

## 2012-12-15 MED ORDER — CITALOPRAM HYDROBROMIDE 10 MG PO TABS
10.0000 mg | ORAL_TABLET | Freq: Every day | ORAL | Status: DC
  Administered 2012-12-16 – 2012-12-20 (×5): 10 mg via ORAL
  Filled 2012-12-15 (×5): qty 1

## 2012-12-15 MED ORDER — ADULT MULTIVITAMIN W/MINERALS CH
1.0000 | ORAL_TABLET | Freq: Every day | ORAL | Status: DC
  Administered 2012-12-16 – 2012-12-19 (×4): 1 via ORAL
  Filled 2012-12-15 (×5): qty 1

## 2012-12-15 MED ORDER — FUROSEMIDE 40 MG PO TABS
40.0000 mg | ORAL_TABLET | Freq: Every day | ORAL | Status: DC
  Filled 2012-12-15: qty 1

## 2012-12-15 MED ORDER — HEPARIN (PORCINE) IN NACL 100-0.45 UNIT/ML-% IJ SOLN
700.0000 [IU]/h | INTRAMUSCULAR | Status: DC
  Administered 2012-12-16: 700 [IU]/h via INTRAVENOUS
  Administered 2012-12-16: 600 [IU]/h via INTRAVENOUS
  Filled 2012-12-15 (×2): qty 250

## 2012-12-15 MED ORDER — ALBUTEROL SULFATE HFA 108 (90 BASE) MCG/ACT IN AERS
2.0000 | INHALATION_SPRAY | Freq: Four times a day (QID) | RESPIRATORY_TRACT | Status: DC | PRN
  Filled 2012-12-15 (×2): qty 6.7

## 2012-12-15 MED ORDER — CARVEDILOL 3.125 MG PO TABS
3.1250 mg | ORAL_TABLET | Freq: Two times a day (BID) | ORAL | Status: DC
  Administered 2012-12-16 – 2012-12-19 (×5): 3.125 mg via ORAL
  Filled 2012-12-15 (×11): qty 1

## 2012-12-15 MED ORDER — WARFARIN - PHARMACIST DOSING INPATIENT: Freq: Every day | Status: DC

## 2012-12-15 MED ORDER — RAMIPRIL 2.5 MG PO CAPS
2.5000 mg | ORAL_CAPSULE | Freq: Every day | ORAL | Status: DC
  Filled 2012-12-15 (×2): qty 1

## 2012-12-15 MED ORDER — SODIUM CHLORIDE 0.9 % IV SOLN
250.0000 mL | INTRAVENOUS | Status: DC | PRN
  Administered 2012-12-15: 250 mL via INTRAVENOUS

## 2012-12-15 MED ORDER — QUETIAPINE FUMARATE 25 MG PO TABS
25.0000 mg | ORAL_TABLET | Freq: Every day | ORAL | Status: DC
  Administered 2012-12-16 – 2012-12-19 (×4): 25 mg via ORAL
  Filled 2012-12-15 (×7): qty 1

## 2012-12-15 MED ORDER — METOPROLOL TARTRATE 1 MG/ML IV SOLN: 2.5000 mg | Freq: Four times a day (QID) | INTRAVENOUS | Status: DC | PRN

## 2012-12-15 NOTE — Progress Notes (Signed)
ANTICOAGULATION CONSULT NOTE - Initial Consult  Pharmacy Consult for heparin Indication: CVA and apical thrombus  Allergies  Allergen Reactions  . Codeine Itching    All over the body  . Penicillins Hives and Other (See Comments)    "Whelps" per patient.  . Hydralazine     02/28/11 Family unsure if this is allergy for pt    Patient Measurements: Weight: 40kg  Vital Signs: Temp: 97.6 F (36.4 C) (10/25 2208) Temp src: Axillary (10/25 2208) BP: 134/79 mmHg (10/25 2238) Pulse Rate: 65 (10/25 2208)  Labs:  Recent Labs  12/15/12 1822 12/15/12 1837  HGB 14.3 14.6  HCT 42.5 43.0  PLT 204  --   APTT 34  --   LABPROT 14.4  --   INR 1.14  --   CREATININE 1.24* 1.50*  TROPONINI <0.30  --      Medical History: Past Medical History  Diagnosis Date  . Weight loss, unintentional   . Trouble swallowing   . Change in voice   . Substance abuse   . Dementia   . Asthma   . Shortness of breath   . Seizures     "    It has been along time "  . CHF (congestive heart failure)   . Headache(784.0)   . Hypertension   . Arthralgia   . Tremors of nervous system   . Anxiety   . Rib fractures     hx of May 2012  . Heart attack 05/21/10  . Schizophrenia   . Coronary artery disease   . Stroke 07/11/10    hx of R CVA   . COPD (chronic obstructive pulmonary disease)   . Obstructive sleep apnea   . Breast cancer 01/28/11    L breast, inv ductal/in situ, ER/PR -, Her2 -    Medications:  Prescriptions prior to admission  Medication Sig Dispense Refill  . albuterol (PROVENTIL) 90 MCG/ACT inhaler Inhale 2 puffs into the lungs every 6 (six) hours as needed. For wheezing       . ALPRAZolam (XANAX) 0.25 MG tablet Take 1 tablet (0.25 mg total) by mouth every 4 (four) hours as needed for anxiety.  60 tablet  0  . budesonide-formoterol (SYMBICORT) 80-4.5 MCG/ACT inhaler Inhale 2 puffs into the lungs 2 (two) times daily.      . carvedilol (COREG) 3.125 MG tablet Take 1 tablet (3.125 mg  total) by mouth 2 (two) times daily with a meal.  60 tablet  1  . citalopram (CELEXA) 10 MG tablet Take 1 tablet (10 mg total) by mouth daily.  30 tablet  1  . furosemide (LASIX) 40 MG tablet Take 40 mg by mouth daily.      . Multiple Vitamin (MULTIVITAMIN WITH MINERALS) TABS tablet Take 1 tablet by mouth daily.      . nitroGLYCERIN (NITROSTAT) 0.4 MG SL tablet Place 1 tablet (0.4 mg total) under the tongue every 5 (five) minutes as needed. For chest pain  30 tablet  12  . QUEtiapine (SEROQUEL) 25 MG tablet Take 1 tablet (25 mg total) by mouth at bedtime.  30 tablet  1  . ramipril (ALTACE) 2.5 MG capsule Take 1 capsule (2.5 mg total) by mouth daily.  30 capsule  1  . warfarin (COUMADIN) 2 MG tablet Take 1 tablet (2 mg total) by mouth daily at 6 PM.  30 tablet  1   Scheduled:  . budesonide-formoterol  2 puff Inhalation BID  . [START ON 12/16/2012] carvedilol  3.125 mg Oral BID WC  . citalopram  10 mg Oral Daily  . furosemide  40 mg Oral Daily  . multivitamin with minerals  1 tablet Oral Daily  . QUEtiapine  25 mg Oral QHS  . ramipril  2.5 mg Oral Daily  . sodium chloride  3 mL Intravenous Q12H  . warfarin  3 mg Oral NOW  . [START ON 12/16/2012] Warfarin - Pharmacist Dosing Inpatient   Does not apply q1800   Infusions:  . [START ON 12/16/2012] heparin      Assessment: 67yo female with recent admission for stroke, now c/o slurred speech and difficulty ambulating, had tPA 3wk ago so was not candidate for tPA on this admission, no acute changes on CT, INR found to be low, failed swallow screen and cannot take Coumadin (IV warfarin on backorder), to begin heparin.  Goal of Therapy:  Heparin level 0.3-0.5 units/ml Monitor platelets by anticoagulation protocol: Yes   Plan:  Will begin heparin 600 units/hr (based on recent admission rate) and monitor heparin levels and CBC.  Vernard Gambles, PharmD, BCPS  12/15/2012,11:49 PM

## 2012-12-15 NOTE — Consult Note (Signed)
Referring Physician: Fayrene Fearing    Chief Complaint: Dysarthria and worsening right facial droop with ataxia.  HPI: [name] is an 67 y.o. female who was recently discharged from [facility] after an acute stroke for which she received tPA.  Work up revealed a small apical thrombus and the patient was started on Coumadin.  At discharge the patient had a right facial droop, dysarthria and left sided weakness.  Today when coming out of the bathroom was noted to have worsening dysarthria, facial droop and ataxia.  EMS was called at that time and the patient was brought in as a code stroke.  Initial NIHSS of 5.   Date last known well: Date: 12/15/2012 Time last known well: Time: 17:00 tPA Given: No: Recent infarct, On coumadin  Past Medical History  Diagnosis Date  . Weight loss, unintentional   . Trouble swallowing   . Change in voice   . Substance abuse   . Dementia   . Asthma   . Shortness of breath   . Seizures     "    It has been along time "  . CHF (congestive heart failure)   . Headache(784.0)   . Hypertension   . Arthralgia   . Tremors of nervous system   . Anxiety   . Rib fractures     hx of May 2012  . Heart attack 05/21/10  . Schizophrenia   . Coronary artery disease   . Stroke 07/11/10    hx of R CVA   . COPD (chronic obstructive pulmonary disease)   . Obstructive sleep apnea   . 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 Total abdominal hysterectomy  age 88    Family History  Problem Relation Age of Onset  . Breast cancer Mother   . Birth defects Mother     breast  . Cancer Mother     breast  . Heart disease Father     heart attack  . Heart attack Father   . Heart attack Brother   . Cancer Brother     throat, lung  . Cancer Paternal Aunt   . Cancer Maternal Grandmother     breast    Social History:  reports that she quit smoking about 4 years ago. Her smoking use included  Cigarettes. She has a 50 pack-year smoking history. She has never used smokeless tobacco. She reports that she drinks alcohol. She reports that she uses illicit drugs.  Allergies:  Allergies  Allergen Reactions  . Codeine Itching    All over the body  . Penicillins Hives and Other (See Comments)    "Whelps" per patient.  . Hydralazine     02/28/11 Family unsure if this is allergy for pt    Medications: I have reviewed the patient's current medications. Prior to Admission:  Current outpatient prescriptions:albuterol (PROVENTIL) 90 MCG/ACT inhaler, Inhale 2 puffs into the lungs every 6 (six) hours as needed. For wheezing , Disp: , Rfl: ;  ALPRAZolam (XANAX) 0.25 MG tablet, Take 1 tablet (0.25 mg total) by mouth every 4 (four) hours as needed for anxiety., Disp: 60 tablet, Rfl: 0;  budesonide-formoterol (SYMBICORT) 80-4.5 MCG/ACT inhaler, Inhale 2 puffs into the lungs 2 (two) times daily., Disp: , Rfl:  carvedilol (COREG) 3.125 MG tablet, Take 1 tablet (3.125 mg total) by mouth 2 (two) times daily with a meal., Disp: 60  tablet, Rfl: 1;  citalopram (CELEXA) 10 MG tablet, Take 1 tablet (10 mg total) by mouth daily., Disp: 30 tablet, Rfl: 1;  furosemide (LASIX) 40 MG tablet, Take 40 mg by mouth daily., Disp: , Rfl: ;  Multiple Vitamin (MULTIVITAMIN WITH MINERALS) TABS tablet, Take 1 tablet by mouth daily., Disp: , Rfl:  nitroGLYCERIN (NITROSTAT) 0.4 MG SL tablet, Place 1 tablet (0.4 mg total) under the tongue every 5 (five) minutes as needed. For chest pain, Disp: 30 tablet, Rfl: 12;  QUEtiapine (SEROQUEL) 25 MG tablet, Take 1 tablet (25 mg total) by mouth at bedtime., Disp: 30 tablet, Rfl: 1;  ramipril (ALTACE) 2.5 MG capsule, Take 1 capsule (2.5 mg total) by mouth daily., Disp: 30 capsule, Rfl: 1 warfarin (COUMADIN) 2 MG tablet, Take 1 tablet (2 mg total) by mouth daily at 6 PM., Disp: 30 tablet, Rfl: 1  ROS: History obtained from the patient  General ROS: negative for - chills, fatigue, fever,  night sweats, weight gain or weight loss Psychological ROS: negative for - behavioral disorder, hallucinations, memory difficulties, mood swings or suicidal ideation Ophthalmic ROS: negative for - blurry vision, double vision, eye pain or loss of vision ENT ROS: negative for - epistaxis, nasal discharge, oral lesions, sore throat, tinnitus or vertigo Allergy and Immunology ROS: negative for - hives or itchy/watery eyes Hematological and Lymphatic ROS: negative for - bleeding problems, bruising or swollen lymph nodes Endocrine ROS: negative for - galactorrhea, hair pattern changes, polydipsia/polyuria or temperature intolerance Respiratory ROS: negative for - cough, hemoptysis, shortness of breath or wheezing Cardiovascular ROS: negative for - chest pain, dyspnea on exertion, edema or irregular heartbeat Gastrointestinal ROS: negative for - abdominal pain, diarrhea, hematemesis, nausea/vomiting or stool incontinence Genito-Urinary ROS: negative for - dysuria, hematuria, incontinence or urinary frequency/urgency Musculoskeletal ROS: negative for - joint swelling or muscular weakness Neurological ROS: as noted in HPI Dermatological ROS: negative for rash and skin lesion changes  Physical Examination: Blood pressure 119/76, temperature 97.7 F (36.5 C), temperature source Oral, resp. rate 20, SpO2 97.00%.  Neurologic Examination: Mental Status: Alert.  Speech fluent but dysarthric.  Able to follow 3 step commands without difficulty. Cranial Nerves: II: Discs flat bilaterally; Visual fields grossly normal, pupils equal, round, reactive to light and accommodation III,IV, VI: ptosis not present, extra-ocular motions intact bilaterally V,VII: decrease in right NLF, facial light touch sensation normal bilaterally VIII: hearing normal bilaterally IX,X: gag reflex present XI: bilateral shoulder shrug XII: midline tongue extension Motor: Right : Upper extremity   5/5    Left:     Upper extremity    5-/5  Lower extremity   5/5     Lower extremity   4/5 Tone and bulk:normal tone throughout; no atrophy noted Sensory: Pinprick and light touch intact throughout, bilaterally Deep Tendon Reflexes: Symmetric throughout Plantars: Right: upgoing   Left: upgoing Cerebellar: normal finger-to-nose.  Heel-to-shin testing unable to be performed on the left Gait: unable to test CV: pulses palpable throughout   Laboratory Studies:  Basic Metabolic Panel: No results found for this basename: NA, K, CL, CO2, GLUCOSE, BUN, CREATININE, CALCIUM, MG, PHOS,  in the last 168 hours  Liver Function Tests: No results found for this basename: AST, ALT, ALKPHOS, BILITOT, PROT, ALBUMIN,  in the last 168 hours No results found for this basename: LIPASE, AMYLASE,  in the last 168 hours No results found for this basename: AMMONIA,  in the last 168 hours  CBC: No results found for this basename: WBC, NEUTROABS, HGB,  HCT, MCV, PLT,  in the last 168 hours  Cardiac Enzymes: No results found for this basename: CKTOTAL, CKMB, CKMBINDEX, TROPONINI,  in the last 168 hours  BNP: No components found with this basename: POCBNP,   CBG: No results found for this basename: GLUCAP,  in the last 168 hours  Microbiology: Results for orders placed during the hospital encounter of 11/24/12  URINE CULTURE     Status: None   Collection Time    11/24/12  7:50 PM      Result Value Range Status   Specimen Description URINE, RANDOM   Final   Special Requests ADDED 2020   Final   Culture  Setup Time     Final   Value: 11/25/2012 03:03     Performed at [facility] Count     Final   Value: >=100,000 COLONIES/ML     Performed at [facility]   Culture     Final   Value: Multiple bacterial morphotypes present, none predominant. Suggest appropriate recollection if clinically indicated.     Performed at [facility]   Report Status 11/26/2012 FINAL   Final  MRSA PCR SCREENING     Status: None    Collection Time    11/24/12  8:54 PM      Result Value Range Status   MRSA by PCR NEGATIVE  NEGATIVE Final   Comment:            The GeneXpert MRSA Assay (FDA     approved for NASAL specimens     only), is one component of a     comprehensive MRSA colonization     surveillance program. It is not     intended to diagnose MRSA     infection nor to guide or     monitor treatment for     MRSA infections.    Coagulation Studies: No results found for this basename: LABPROT, INR,  in the last 72 hours  Urinalysis: No results found for this basename: COLORURINE, APPERANCEUR, LABSPEC, PHURINE, GLUCOSEU, HGBUR, BILIRUBINUR, KETONESUR, PROTEINUR, UROBILINOGEN, NITRITE, LEUKOCYTESUR,  in the last 168 hours  Lipid Panel:    Component Value Date/Time   CHOL 175 11/25/2012 0401   TRIG 59 11/25/2012 0401   HDL 78 11/25/2012 0401   CHOLHDL 2.2 11/25/2012 0401   VLDL 12 11/25/2012 0401   LDLCALC 85 11/25/2012 0401    HgbA1C:  Lab Results  Component Value Date   HGBA1C 6.2* 11/25/2012    Urine Drug Screen:      Component Value Date/Time   LABOPIA NONE DETECTED 11/24/2012 1950   LABOPIA NEGATIVE 04/01/2009 1007   COCAINSCRNUR NONE DETECTED 11/24/2012 1950   COCAINSCRNUR  Value: POSITIVE (NOTE) Result repeated and verified. Sent for confirmatory testing* 04/01/2009 1007   LABBENZ NONE DETECTED 11/24/2012 1950   LABBENZ NEGATIVE 04/01/2009 1007   AMPHETMU NONE DETECTED 11/24/2012 1950   AMPHETMU NEGATIVE 04/01/2009 1007   THCU NONE DETECTED 11/24/2012 1950   LABBARB NONE DETECTED 11/24/2012 1950    Alcohol Level: No results found for this basename: ETH,  in the last 168 hours   Imaging: Ct Head Wo Contrast  12/15/2012   ADDENDUM REPORT: 12/15/2012 18:28  ADDENDUM: Critical Value/emergent results were called by telephone at the time of interpretation on 12/15/2012 at 6:27 PM to Dr.Kallstrom, who verbally acknowledged these results.   Electronically Signed   By: Esperanza Heir M.D.   On: 12/15/2012  18:28   12/15/2012  CLINICAL DATA:  Code stroke, slurred speech, facial droop, head TPA 3 weeks ago  EXAM: CT HEAD WITHOUT CONTRAST  TECHNIQUE: Contiguous axial images were obtained from the base of the skull through the vertex without intravenous contrast.  COMPARISON:  11/25/2012  FINDINGS: No evidence of hemorrhage or extra-axial fluid. Multiple areas of infarction identified bilaterally including anterior cerebral artery territory, right posterior parieto-occipital region, right cerebellum, left temporoparietal area, left occipital lobe, and left cerebellar hemisphere. These were all present previously. There is diffuse atrophy.  IMPRESSION: Multiple areas of subacute to chronic infarction, all of which were present on the prior study. No evidence of hemorrhage or extra-axial fluid.I paged Dr. Thad Ranger at 1815 on 12/15/12.  Electronically Signed: By: Esperanza Heir M.D. On: 12/15/2012 18:19    Assessment: 67 y.o. female presenting with worsening of her previous stroke symptoms.  Currently patient appears to be at baseline.  She is not a tPA candidate secondary to her recent infarct and being on Coumadin.  CT reviewed and shows no acute changes.   Stroke Risk Factors - hypertension and recent infarct  Plan: 1. Prophylactic therapy-Anticoagulation: Coumadin to be continued with a target INR between 2 and 3.   2. Follow up of INR.  3. No further neurologic testing to be performed at this time.    Case to be discussed with Dr. Lenon Oms, MD [facility] [phone] 12/15/2012, 6:39 PM

## 2012-12-15 NOTE — ED Notes (Signed)
Pt from home. Just recently discharged on 10/4 for Code stroke. Family reports pt went to the bathroom and came out with slurred speech and difficulty ambulating at 1700. Pt received TPA with previous stroke 3 weeks ago and is on Coumadin.

## 2012-12-15 NOTE — H&P (Signed)
[facility] History and Physical  [name]:[id number] DOB: 05-02-1945 DOA: 12/15/2012  Referring physician:  EDP PCP: Robynn Pane, MD  Specialists:   Chief Complaint:  Worsening Slurred Speech and Difficulty Walking  HPI: [name] is a 67 y.o. female with a recent CVA on 11/24/2012 discharged from Rehab 3 days ago who presents with worsening dysarthria and Ataxia.  Her symptoms began at 5 pm, and she was brought to the ED for evaluation as a Code Stroke and was seen by Neurologist Dr. Thad Ranger and deemed not appropriate for TPA Rx since she is on Coumadin Rx, and was previously treated with TPA.    A  CT scan was performed and was found to be negative for new findings, and her coumadin level was found to be sub therapeutic.      Review of Systems: The patient denies anorexia, fever, chills, headaches, weight loss, vision loss, diplopia, dizziness, decreased hearing, rhinitis, hoarseness, chest pain, syncope, dyspnea on exertion, peripheral edema, balance deficits, cough, hemoptysis, abdominal pain, nausea, vomiting, diarrhea, constipation, hematemesis, melena, hematochezia, severe indigestion/heartburn, dysuria, hematuria, incontinence, muscle weakness, suspicious skin lesions, transient blindness, difficulty walking, depression, unusual weight change, abnormal bleeding, enlarged lymph nodes, angioedema, and breast masses.    Past Medical History  Diagnosis Date  . Weight loss, unintentional   . Trouble swallowing   . Change in voice   . Substance abuse   . Dementia   . Asthma   . Shortness of breath   . Seizures     "    It has been along time "  . CHF (congestive heart failure)   . Headache(784.0)   . Hypertension   . Arthralgia   . Tremors of nervous system   . Anxiety   . Rib fractures     hx of May 2012  . Heart attack 05/21/10  . Schizophrenia   . Coronary artery disease   . Stroke 07/11/10    hx of R CVA   . COPD (chronic obstructive  pulmonary disease)   . Obstructive sleep apnea   . 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 Total abdominal hysterectomy  age 89    Prior to Admission medications   Medication Sig Start Date End Date Taking? Authorizing Provider  albuterol (PROVENTIL) 90 MCG/ACT inhaler Inhale 2 puffs into the lungs every 6 (six) hours as needed. For wheezing    Yes Historical Provider, MD  ALPRAZolam (XANAX) 0.25 MG tablet Take 1 tablet (0.25 mg total) by mouth every 4 (four) hours as needed for anxiety. 12/12/12   Mcarthur Rossetti Angiulli, PA-C  budesonide-formoterol (SYMBICORT) 80-4.5 MCG/ACT inhaler Inhale 2 puffs into the lungs 2 (two) times daily.    Historical Provider, MD  carvedilol (COREG) 3.125 MG tablet Take 1 tablet (3.125 mg total) by mouth 2 (two) times daily with a meal. 12/12/12   Mcarthur Rossetti Angiulli, PA-C  citalopram (CELEXA) 10 MG tablet Take 1 tablet (10 mg total) by mouth daily. 12/12/12   Mcarthur Rossetti Angiulli, PA-C  furosemide (LASIX) 40 MG tablet Take 40 mg by mouth daily.    Historical Provider, MD  Multiple Vitamin (MULTIVITAMIN WITH MINERALS) TABS tablet Take 1 tablet by mouth daily. 12/12/12   Mcarthur Rossetti Angiulli, PA-C  nitroGLYCERIN (NITROSTAT) 0.4 MG SL tablet Place 1 tablet (0.4 mg total) under the tongue every 5 (five) minutes as needed.  For chest pain 12/12/12   Mcarthur Rossetti Angiulli, PA-C  QUEtiapine (SEROQUEL) 25 MG tablet Take 1 tablet (25 mg total) by mouth at bedtime. 12/12/12   Mcarthur Rossetti Angiulli, PA-C  ramipril (ALTACE) 2.5 MG capsule Take 1 capsule (2.5 mg total) by mouth daily. 12/12/12   Mcarthur Rossetti Angiulli, PA-C  warfarin (COUMADIN) 2 MG tablet Take 1 tablet (2 mg total) by mouth daily at 6 PM. 12/12/12   Mcarthur Rossetti Angiulli, PA-C    Allergies  Allergen Reactions  . Codeine Itching    All over the body  . Penicillins Hives and Other (See Comments)    "Whelps" per patient.  . Hydralazine      02/28/11 Family unsure if this is allergy for pt    Social History:  reports that she quit smoking about 4 years ago. Her smoking use included Cigarettes. She has a 50 pack-year smoking history. She has never used smokeless tobacco. She reports that she drinks alcohol. She reports that she uses illicit drugs.     Family History  Problem Relation Age of Onset  . Breast cancer Mother   . Birth defects Mother     breast  . Cancer Mother     breast  . Heart disease Father     heart attack  . Heart attack Father   . Heart attack Brother   . Cancer Brother     throat, lung  . Cancer Paternal Aunt   . Cancer Maternal Grandmother     breast      Physical Exam:  GEN:  Pleasant Cachectic Elderly 67 y.o. African American female  examined  and in no acute distress; cooperative with exam Filed Vitals:   12/15/12 2000 12/15/12 2015 12/15/12 2045 12/15/12 2208  BP: 102/70 119/72 135/93   Pulse: 84 53 64 65  Temp:    97.6 F (36.4 C)  TempSrc:    Axillary  Resp: 34 17 20   SpO2: 98% 95% 99% 100%   Blood pressure 135/93, pulse 65, temperature 97.6 F (36.4 C), temperature source Axillary, resp. rate 20, SpO2 100.00%. PSYCH: She is alert and oriented x4; does not appear anxious does not appear depressed; affect is normal HEENT: Normocephalic and Atraumatic, Mucous membranes pink; PERRLA; EOM intact; Fundi:  Benign;  No scleral icterus, Nares: Patent, Oropharynx: Clear,  Fair Dentition, Neck:  FROM, no cervical lymphadenopathy nor thyromegaly or carotid bruit; no JVD; Breasts:: Not examined CHEST WALL: No tenderness CHEST: Normal respiration, clear to auscultation bilaterally HEART: Regular rate and rhythm; no murmurs rubs or gallops BACK: No kyphosis or scoliosis; no CVA tenderness ABDOMEN: Positive Bowel Sounds, Scaphoid, soft non-tender; no masses, no organomegaly.   Rectal Exam: Not done EXTREMITIES: No cyanosis, clubbing or edema; no ulcerations. Genitalia: not examined PULSES: 2+  and symmetric SKIN: Normal hydration no rash or ulceration CNS: Cranial nerves 2-12 ;      Labs on Admission:  Basic Metabolic Panel:  Recent Labs Lab 12/15/12 1822 12/15/12 1837  NA 144 144  K 3.8 3.9  CL 104 106  CO2 26  --   GLUCOSE 85 88  BUN 19 19  CREATININE 1.24* 1.50*  CALCIUM 10.0  --    Liver Function Tests:  Recent Labs Lab 12/15/12 1822  AST 24  ALT 11  ALKPHOS 84  BILITOT 0.8  PROT 7.9  ALBUMIN 4.0   No results found for this basename: LIPASE, AMYLASE,  in the last 168 hours No results found for this basename:  AMMONIA,  in the last 168 hours CBC:  Recent Labs Lab 12/15/12 1822 12/15/12 1837  WBC 5.7  --   NEUTROABS 4.2  --   HGB 14.3 14.6  HCT 42.5 43.0  MCV 92.4  --   PLT 204  --    Cardiac Enzymes:  Recent Labs Lab 12/15/12 1822  TROPONINI <0.30    BNP (last 3 results) No results found for this basename: PROBNP,  in the last 8760 hours CBG:  Recent Labs Lab 12/15/12 1838  GLUCAP 101*    Radiological Exams on Admission: Dg Chest 2 View  12/15/2012   CLINICAL DATA:  Stroke. CVA.  EXAM: CHEST  2 VIEW  COMPARISON:  11/24/2012.  FINDINGS: The cardiopericardial silhouette is upper limits of normal for projection. Aortic arch atherosclerosis. Emphysema with bilateral pleural apical scarring. Basilar atelectasis is present on the right, mild. Mild prominence of the ascending aorta on the lateral view it is probably due to uncoiling and tortuosity.  IMPRESSION: No interval change or active cardiopulmonary disease.   Electronically Signed   By: Andreas Newport M.D.   On: 12/15/2012 21:39   Ct Head Wo Contrast  12/15/2012   ADDENDUM REPORT: 12/15/2012 18:28  ADDENDUM: Critical Value/emergent results were called by telephone at the time of interpretation on 12/15/2012 at 6:27 PM to Dr.Wofford, who verbally acknowledged these results.   Electronically Signed   By: Esperanza Heir M.D.   On: 12/15/2012 18:28   12/15/2012   CLINICAL DATA:   Code stroke, slurred speech, facial droop, head TPA 3 weeks ago  EXAM: CT HEAD WITHOUT CONTRAST  TECHNIQUE: Contiguous axial images were obtained from the base of the skull through the vertex without intravenous contrast.  COMPARISON:  11/25/2012  FINDINGS: No evidence of hemorrhage or extra-axial fluid. Multiple areas of infarction identified bilaterally including anterior cerebral artery territory, right posterior parieto-occipital region, right cerebellum, left temporoparietal area, left occipital lobe, and left cerebellar hemisphere. These were all present previously. There is diffuse atrophy.  IMPRESSION: Multiple areas of subacute to chronic infarction, all of which were present on the prior study. No evidence of hemorrhage or extra-axial fluid.I paged Dr. Thad Ranger at 1815 on 12/15/12.  Electronically Signed: By: Esperanza Heir M.D. On: 12/15/2012 18:19     EKG: Independently reviewed.    Assessment/Plan Principal Problem:   TIA (transient ischemic attack) Active Problems:   HYPERLIPIDEMIA   HYPERTENSION, BENIGN ESSENTIAL   CAD   CHF   COPD   Embolic cerebral infarction   Late effects of cerebrovascular accident   1.   TIA-  Versus CVA-  workup in progress, MRI/MRA in AM, had 2 D ECHO and Carotid US 3 weeks ago.  Placed on IV Heparin drip due to hx of apical thrombus, and embolic CVA,  NPO after failing Swallow eval.   Check Fasting Lipids in AM.     2.    HTN-  IV Lopressor PRN  3.   CAD-  Hx , Stable   4.    CHF- stable.    5.    COPD/Asthma- stable, continue albuterol PRN.      Code Status:   FULL CODE Family Communication:    Family at Bedside Disposition Plan:  Observation   Time spent:  78 Minutes  Ron Parker [facility] Pager [phone]  If 7PM-7AM, please contact night-coverage [email or web address] Password [facility] 12/15/2012, 10:38 PM

## 2012-12-15 NOTE — Progress Notes (Signed)
ANTICOAGULATION CONSULT NOTE - Initial Consult  Pharmacy Consult for Coumadin Indication: Cardioembolic CVA with apical thrombus   Allergies  Allergen Reactions  . Codeine Itching    All over the body  . Penicillins Hives and Other (See Comments)    "Whelps" per patient.  . Hydralazine     02/28/11 Family unsure if this is allergy for pt    Patient Measurements:   Heparin Dosing Weight:   Vital Signs: Temp: 97.7 F (36.5 C) (10/25 1855) Temp src: Oral (10/25 1827) BP: 135/93 mmHg (10/25 2045) Pulse Rate: 64 (10/25 2045)  Labs:  Recent Labs  12/15/12 1822 12/15/12 1837  HGB 14.3 14.6  HCT 42.5 43.0  PLT 204  --   APTT 34  --   LABPROT 14.4  --   INR 1.14  --   CREATININE 1.24* 1.50*  TROPONINI <0.30  --     The CrCl is unknown because both a height and weight (above a minimum accepted value) are required for this calculation.   Medical History: Past Medical History  Diagnosis Date  . Weight loss, unintentional   . Trouble swallowing   . Change in voice   . Substance abuse   . Dementia   . Asthma   . Shortness of breath   . Seizures     "    It has been along time "  . CHF (congestive heart failure)   . Headache(784.0)   . Hypertension   . Arthralgia   . Tremors of nervous system   . Anxiety   . Rib fractures     hx of May 2012  . Heart attack 05/21/10  . Schizophrenia   . Coronary artery disease   . Stroke 07/11/10    hx of R CVA   . COPD (chronic obstructive pulmonary disease)   . Obstructive sleep apnea   . Breast cancer 01/28/11    L breast, inv ductal/in situ, ER/PR -, Her2 -    Medications:  Last Rx 10/22 was for Coumadin 2mg  daily  Assessment: Worsening dysarthria and worsening right facial droop with ataxia from previous stroke.  67 y/o F recently discharged from [facility] then CIR s/p CVA (s/p TPA) and is currently on Coumadin for CVA with apical thrombus. INR on 10/22 was 2.09 but today down to 1.14 on 10/25?   Goal of Therapy:  INR  2-3 Monitor platelets by anticoagulation protocol: Yes   Plan:  Coumadin 3mg  po x 1 tonight Daily INR  Shalice Woodring S. Merilynn Finland, PharmD, BCPS Clinical Staff Pharmacist Pager [phone]  [name] Stillinger 12/15/2012,9:17 PM

## 2012-12-15 NOTE — ED Provider Notes (Addendum)
CSN: [id number]     Arrival date & time 12/15/12  1759 History   First MD Initiated Contact with Patient 12/15/12 1827     Chief Complaint  Patient presents with  . Code Stroke    HPI  Patient presents as a "code stroke". Via EMS. Patient had a recent hospitalization approximately 3 weeks ago has a "code stroke. She was treated with TPA. She had an acute MRI that showed acute infarcts right temporal and left cerebellar. She had dysarthria and ataxia. Her symptoms improved but did not resolve. She continued ataxia although milder and improved speech upon discharge. Tonight according to family  and EMS she went into her bathroom. It came out at approximately 5 PM with slurred speech and difficulty walking. They called EMS and she was transported here.   In review of her chart it shows evaluation included an echocardiogram.  An echocardiogram showed al mural thrombus. She was discharged on Coumadin. She's had no falls no injuries no trauma. Past Medical History  Diagnosis Date  . Weight loss, unintentional   . Trouble swallowing   . Change in voice   . Substance abuse   . Dementia   . Asthma   . Shortness of breath   . Seizures     "    It has been along time "  . CHF (congestive heart failure)   . Headache(784.0)   . Hypertension   . Arthralgia   . Tremors of nervous system   . Anxiety   . Rib fractures     hx of May 2012  . Heart attack 05/21/10  . Schizophrenia   . Coronary artery disease   . Stroke 07/11/10    hx of R CVA   . COPD (chronic obstructive pulmonary disease)   . Obstructive sleep apnea   . 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 Total abdominal hysterectomy  age 71   Family History  Problem Relation Age of Onset  . Breast cancer Mother   . Birth defects Mother     breast  . Cancer Mother     breast  . Heart disease Father     heart attack  . Heart attack Father   .  Heart attack Brother   . Cancer Brother     throat, lung  . Cancer Paternal Aunt   . Cancer Maternal Grandmother     breast    History  Substance Use Topics  . Smoking status: Former Smoker -- 1.00 packs/day for 50 years    Types: Cigarettes    Quit date: 02/22/2008  . Smokeless tobacco: Never Used     Comment: started smoking at age 30  . Alcohol Use: Yes     Comment: social use   OB History   Grav Para Term Preterm Abortions TAB SAB Ect Mult Living                 Review of Systems  Unable to perform ROS  I'm unable to perform a complete review of systems as she is dysarthric but I cannot understand her speech. Ultimately arrival her family they can tell me that she's not had injuries. She does not complain of headache. Ultimately the patient's speech does improve to the point of apical complete review of systems she denies other new symptoms. She states she feels like her speech is better. She  denies headache.  Allergies  Codeine; Penicillins; and Hydralazine  Home Medications   Current Outpatient Rx  Name  Route  Sig  Dispense  Refill  . albuterol (PROVENTIL) 90 MCG/ACT inhaler   Inhalation   Inhale 2 puffs into the lungs every 6 (six) hours as needed. For wheezing          . ALPRAZolam (XANAX) 0.25 MG tablet   Oral   Take 1 tablet (0.25 mg total) by mouth every 4 (four) hours as needed for anxiety.   60 tablet   0   . budesonide-formoterol (SYMBICORT) 80-4.5 MCG/ACT inhaler   Inhalation   Inhale 2 puffs into the lungs 2 (two) times daily.         . carvedilol (COREG) 3.125 MG tablet   Oral   Take 1 tablet (3.125 mg total) by mouth 2 (two) times daily with a meal.   60 tablet   1   . citalopram (CELEXA) 10 MG tablet   Oral   Take 1 tablet (10 mg total) by mouth daily.   30 tablet   1   . furosemide (LASIX) 40 MG tablet   Oral   Take 40 mg by mouth daily.         . Multiple Vitamin (MULTIVITAMIN WITH MINERALS) TABS tablet   Oral   Take 1  tablet by mouth daily.         . nitroGLYCERIN (NITROSTAT) 0.4 MG SL tablet   Sublingual   Place 1 tablet (0.4 mg total) under the tongue every 5 (five) minutes as needed. For chest pain   30 tablet   12   . QUEtiapine (SEROQUEL) 25 MG tablet   Oral   Take 1 tablet (25 mg total) by mouth at bedtime.   30 tablet   1   . ramipril (ALTACE) 2.5 MG capsule   Oral   Take 1 capsule (2.5 mg total) by mouth daily.   30 capsule   1   . warfarin (COUMADIN) 2 MG tablet   Oral   Take 1 tablet (2 mg total) by mouth daily at 6 PM.   30 tablet   1    BP 135/93  Pulse 64  Temp(Src) 97.7 F (36.5 C) (Oral)  Resp 20  SpO2 99% Physical Exam  Constitutional:  Plan nearly cachectic elderly  female  HENT:  Very subtle left facial droop.  Eyes:  No upper facial droop. Extraocular muscles are intact without nystagmus.  Neck: Carotid bruit is not present.  Cardiovascular: An irregular rhythm present.  Sinus rhythm. She will occasionally have runs of atrial fibrillation as I examine her and on her monitor.  Pulmonary/Chest:  Clear breath sounds  Abdominal:  Soft scaphoid benign abdomen  Musculoskeletal:  No muscle or joint abnormalities  Lymphadenopathy:  No palpable lymph nodes  Neurological:  Dysarthric speech. Left facial droop. Over 5 strength in left upper extremity the remainder of her extremities show 5 over 5 strength.  Skin:  The skin abnormalities. No rash.  Psychiatric:  :    ED Course  Procedures (including critical care time) Labs Review Labs Reviewed  COMPREHENSIVE METABOLIC PANEL - Abnormal; Notable for the following:    Creatinine, Ser 1.24 (*)    GFR calc non Af Amer 44 (*)    GFR calc Af Amer 51 (*)    All other components within normal limits  GLUCOSE, CAPILLARY - Abnormal; Notable for the following:    Glucose-Capillary 101 (*)  All other components within normal limits  POCT I-STAT, CHEM 8 - Abnormal; Notable for the following:    Creatinine, Ser  1.50 (*)    All other components within normal limits  ETHANOL  PROTIME-INR  APTT  CBC  DIFFERENTIAL  TROPONIN I  URINE RAPID DRUG SCREEN (HOSP PERFORMED)  URINALYSIS, ROUTINE W REFLEX MICROSCOPIC  HEMOGLOBIN A1C  LIPID PANEL  POCT I-STAT TROPONIN I   Imaging Review Ct Head Wo Contrast  12/15/2012   ADDENDUM REPORT: 12/15/2012 18:28  ADDENDUM: Critical Value/emergent results were called by telephone at the time of interpretation on 12/15/2012 at 6:27 PM to Dr.Voeltz, who verbally acknowledged these results.   Electronically Signed   By: Esperanza Heir M.D.   On: 12/15/2012 18:28   12/15/2012   CLINICAL DATA:  Code stroke, slurred speech, facial droop, head TPA 3 weeks ago  EXAM: CT HEAD WITHOUT CONTRAST  TECHNIQUE: Contiguous axial images were obtained from the base of the skull through the vertex without intravenous contrast.  COMPARISON:  11/25/2012  FINDINGS: No evidence of hemorrhage or extra-axial fluid. Multiple areas of infarction identified bilaterally including anterior cerebral artery territory, right posterior parieto-occipital region, right cerebellum, left temporoparietal area, left occipital lobe, and left cerebellar hemisphere. These were all present previously. There is diffuse atrophy.  IMPRESSION: Multiple areas of subacute to chronic infarction, all of which were present on the prior study. No evidence of hemorrhage or extra-axial fluid.I paged Dr. Thad Ranger at 1815 on 12/15/12.  Electronically Signed: By: Esperanza Heir M.D. On: 12/15/2012 18:19    EKG Interpretation     Ventricular Rate:  64 PR Interval:  184 QRS Duration: 159 QT Interval:  482 QTC Calculation: 497 R Axis:   54 Text Interpretation:  Age not entered, assumed to be  67 years old for purpose of ECG interpretation Sinus rhythm Atrial premature complex IVCD, consider atypical LBBB            MDM   1. TIA (transient ischemic attack)   2. CVA (cerebral infarction)    By myself as well as  Dr. Thad Ranger of urology upon her arrival. Her CT shows no acute process it does show completed infarcts that are unchanged versus comparison of her CT and MRI. She contraindicates for acute lytic therapy because her anticoagulation with Coumadin. The portion she is not therapeutic on her Coumadin at 1.1. Her speech is improved but is not improve her baseline per family he does arrive after a short time. Plan she'll be admitted. Discussed case with Dr. Lovell Sheehan and try hospitalist. She'll be treated bridge with Lovenox.    Roney Marion, MD 12/15/12 2107  Roney Marion, MD 12/15/12 1610  Roney Marion, MD 12/15/12 2108

## 2012-12-16 ENCOUNTER — Encounter (HOSPITAL_COMMUNITY): Payer: Self-pay | Admitting: *Deleted

## 2012-12-16 ENCOUNTER — Inpatient Hospital Stay (HOSPITAL_COMMUNITY): Payer: Medicare Other

## 2012-12-16 LAB — HEPARIN LEVEL (UNFRACTIONATED): Heparin Unfractionated: 1.02 IU/mL — ABNORMAL HIGH (ref 0.30–0.70)

## 2012-12-16 LAB — CBC
HCT: 37.3 % (ref 36.0–46.0)
MCH: 30.7 pg (ref 26.0–34.0)
MCHC: 33.2 g/dL (ref 30.0–36.0)
MCV: 92.3 fL (ref 78.0–100.0)
Platelets: 210 10*3/uL (ref 150–400)
RBC: 4.04 MIL/uL (ref 3.87–5.11)
WBC: 4.2 10*3/uL (ref 4.0–10.5)

## 2012-12-16 LAB — PROTIME-INR: Prothrombin Time: 15.4 seconds — ABNORMAL HIGH (ref 11.6–15.2)

## 2012-12-16 LAB — LIPID PANEL
HDL: 90 mg/dL (ref >39)
LDL Cholesterol: 124 mg/dL — ABNORMAL HIGH (ref 0–99)
Total CHOL/HDL Ratio: 2.5 RATIO
Triglycerides: 43 mg/dL (ref <150)

## 2012-12-16 LAB — PHOSPHORUS: Phosphorus: 3.8 mg/dL (ref 2.3–4.6)

## 2012-12-16 LAB — TSH: TSH: 0.377 u[IU]/mL (ref 0.350–4.500)

## 2012-12-16 MED ORDER — MAGNESIUM SULFATE 40 MG/ML IJ SOLN
2.0000 g | Freq: Once | INTRAMUSCULAR | Status: AC
  Administered 2012-12-16: 2 g via INTRAVENOUS
  Filled 2012-12-16: qty 50

## 2012-12-16 MED ORDER — WARFARIN - PHARMACIST DOSING INPATIENT: Freq: Every day | Status: DC

## 2012-12-16 MED ORDER — LISINOPRIL 2.5 MG PO TABS
2.5000 mg | ORAL_TABLET | Freq: Every day | ORAL | Status: DC
  Administered 2012-12-16 – 2012-12-20 (×4): 2.5 mg via ORAL
  Filled 2012-12-16 (×5): qty 1

## 2012-12-16 MED ORDER — RESOURCE THICKENUP CLEAR PO POWD
ORAL | Status: DC | PRN
  Filled 2012-12-16: qty 125

## 2012-12-16 MED ORDER — WARFARIN SODIUM 3 MG PO TABS
3.0000 mg | ORAL_TABLET | Freq: Once | ORAL | Status: AC
  Administered 2012-12-16: 3 mg via ORAL
  Filled 2012-12-16: qty 1

## 2012-12-16 MED ORDER — SODIUM CHLORIDE 0.9 % IV SOLN: 250.0000 mL | INTRAVENOUS | Status: DC | PRN

## 2012-12-16 MED ORDER — STROKE: EARLY STAGES OF RECOVERY BOOK
Freq: Once | Status: AC
  Administered 2012-12-16: 16:00:00
  Filled 2012-12-16: qty 1

## 2012-12-16 NOTE — Evaluation (Addendum)
Clinical/Bedside Swallow Evaluation Patient Details  Name: [name] MRN: [id number] Date of Birth: [birth year]  Today's Date: 12/16/2012 Time: 5284-1324 SLP Time Calculation (min): 39 min  Past Medical History:  Past Medical History  Diagnosis Date  . Weight loss, unintentional   . Trouble swallowing   . Change in voice   . Substance abuse   . Dementia   . Asthma   . Shortness of breath   . Seizures     "    It has been along time "  . CHF (congestive heart failure)   . Headache(784.0)   . Hypertension   . Arthralgia   . Tremors of nervous system   . Anxiety   . Rib fractures     hx of May 2012  . Heart attack 05/21/10  . Schizophrenia   . Coronary artery disease   . Stroke 07/11/10    hx of R CVA   . COPD (chronic obstructive pulmonary disease)   . Obstructive sleep apnea   . Breast cancer 01/28/11    L breast, inv ductal/in situ, ER/PR -, Her2 -   Past Surgical History:  Past Surgical History  Procedure Laterality Date  . Total abdominal hysterectomy  age 52   HPI: [name] is a 67 y.o. female with a recent CVA on 11/24/2012 discharged from Rehab 3 days ago who presents with worsening dysarthria and Ataxia. Her symptoms began at 5 pm, and she was brought to the ED for evaluation as a Code Stroke and was seen by Neurologist Dr. Thad Ranger and deemed not appropriate for TPA Rx since she is on Coumadin Rx, and was previously treated with TPA. A CT scan was performed and was found to be negative for new findings, and her coumadin level was found to be sub therapeutic. BSE completed per Stroke Protocol.         Assessment / Plan / Recommendation Clinical Impression  BSE completed.  Continues with baseline oral and pharyngeal dysphagia.  Intermittent wet vocal quality s/p swallow of nectar thick liquid by cup with consecutive sips indicating possible penetration to cords.  Cleared with cued throat clear.  Vocal quality clear with modified sips but required  hand over hand assist.   PO trials limited to nectar thick liquid and puree consistency as patient d/c'd from CIR on dysphagia 1 and nectar thick liquids 3 recently.  MBS completed on 11/26/12 indicates silent aspiration with thin liquid by cup.  Recommend to proceed with dysphagia 1 (puree) and nectar thick liquids with full supervision with all meals due to cognitive status.  ST to follow in Acute Care setting for diet tolerance and possible advancement. Repeat MBS to be determined.      Aspiration Risk  Moderate    Diet Recommendation Dysphagia 1 (Puree);Nectar-thick liquid   Liquid Administration via: Cup;No straw Medication Administration: Crushed with puree Supervision: Patient able to self feed;Full supervision/cueing for compensatory strategies Compensations: Slow rate;Small sips/bites;Check for pocketing Postural Changes and/or Swallow Maneuvers: Seated upright 90 degrees;Upright 30-60 min after meal    Other  Recommendations Oral Care Recommendations: Oral care Q4 per protocol Other Recommendations: Order thickener from pharmacy;Prohibited food (jello, ice cream, thin soups);Remove water pitcher;Have oral suction available;Clarify dietary restrictions   Follow Up Recommendations  Skilled Nursing facility;24 hour supervision/assistance    Frequency and Duration min 2x/week  2 weeks     Swallow Study    General Date of Onset: 12/15/12 Type of Study: Bedside swallow evaluation Previous Swallow  Assessment: MBS 11/26/12 dys 1/NTL Diet Prior to this Study: NPO Temperature Spikes Noted: No Respiratory Status: Nasal cannula History of Recent Intubation: No Behavior/Cognition: Alert;Cooperative;Pleasant mood;Confused;Requires cueing Oral Cavity - Dentition: Dentures, top;Dentures, bottom Self-Feeding Abilities: Able to feed self;Needs assist;Needs set up Patient Positioning: Upright in bed Baseline Vocal Quality: Hoarse;Clear Volitional Cough: Weak Volitional Swallow: Able to  elicit    Oral/Motor/Sensory Function Overall Oral Motor/Sensory Function: Impaired at baseline Labial ROM: Reduced right Labial Symmetry: Abnormal symmetry right Labial Strength: Reduced Labial Sensation: Reduced Lingual ROM: Reduced right Lingual Symmetry: Abnormal symmetry right Lingual Strength: Reduced Lingual Sensation: Reduced Facial ROM: Reduced right Facial Symmetry: Right droop Facial Strength: Reduced Facial Sensation: Reduced Mandible: Within Functional Limits   Ice Chips Ice chips: Not tested   Thin Liquid Thin Liquid: Not tested    Nectar Thick Nectar Thick Liquid: Impaired Presentation: Cup;Spoon;Self Fed Oral phase functional implications: Right anterior spillage;Prolonged oral transit Pharyngeal Phase Impairments: Suspected delayed Swallow;Decreased hyoid-laryngeal movement Other Comments: intermittent wet vocal quality wtih consecutive sips   Honey Thick Honey Thick Liquid: Not tested   Puree Puree: Impaired Presentation: Self Fed;Spoon Oral Phase Functional Implications: Right anterior spillage Pharyngeal Phase Impairments: Suspected delayed Swallow;Decreased hyoid-laryngeal movement   Solid   GO    Solid: Not tested      Moreen Fowler MS, CCC-SLP [phone] [facility] 12/16/2012,2:00 PM

## 2012-12-16 NOTE — Progress Notes (Addendum)
ANTICOAGULATION CONSULT NOTE - Initial Consult  Pharmacy Consult for heparin Indication: CVA and apical thrombus  Allergies  Allergen Reactions  . Codeine Itching    All over the body  . Penicillins Hives and Other (See Comments)    "Whelps" per patient.  . Hydralazine     02/28/11 Family unsure if this is allergy for pt    Patient Measurements: Weight: 51.2kg Heparin dosing weight: 57.2kg  Vital Signs: Temp: 98.1 F (36.7 C) (10/26 0600) Temp src: Oral (10/26 0400) BP: 130/79 mmHg (10/26 1200) Pulse Rate: 66 (10/26 1200)  Labs:  Recent Labs  12/15/12 1822 12/15/12 1837 12/16/12 0535 12/16/12 0900 12/16/12 1035  HGB 14.3 14.6  --  12.4  --   HCT 42.5 43.0  --  37.3  --   PLT 204  --   --  210  --   APTT 34  --   --   --   --   LABPROT 14.4  --  15.4*  --   --   INR 1.14  --  1.25  --   --   HEPARINUNFRC  --   --   --  0.29*  --   CREATININE 1.24* 1.50*  --   --   --   TROPONINI <0.30  --   --   --  <0.30     Medical History: Past Medical History  Diagnosis Date  . Weight loss, unintentional   . Trouble swallowing   . Change in voice   . Substance abuse   . Dementia   . Asthma   . Shortness of breath   . Seizures     "    It has been along time "  . CHF (congestive heart failure)   . Headache(784.0)   . Hypertension   . Arthralgia   . Tremors of nervous system   . Anxiety   . Rib fractures     hx of May 2012  . Heart attack 05/21/10  . Schizophrenia   . Coronary artery disease   . Stroke 07/11/10    hx of R CVA   . COPD (chronic obstructive pulmonary disease)   . Obstructive sleep apnea   . Breast cancer 01/28/11    L breast, inv ductal/in situ, ER/PR -, Her2 -    Medications:  Prescriptions prior to admission  Medication Sig Dispense Refill  . albuterol (PROVENTIL) 90 MCG/ACT inhaler Inhale 2 puffs into the lungs every 6 (six) hours as needed. For wheezing       . ALPRAZolam (XANAX) 0.25 MG tablet Take 1 tablet (0.25 mg total) by mouth  every 4 (four) hours as needed for anxiety.  60 tablet  0  . budesonide-formoterol (SYMBICORT) 80-4.5 MCG/ACT inhaler Inhale 2 puffs into the lungs 2 (two) times daily.      . carvedilol (COREG) 3.125 MG tablet Take 1 tablet (3.125 mg total) by mouth 2 (two) times daily with a meal.  60 tablet  1  . citalopram (CELEXA) 10 MG tablet Take 1 tablet (10 mg total) by mouth daily.  30 tablet  1  . furosemide (LASIX) 40 MG tablet Take 40 mg by mouth daily.      . Multiple Vitamin (MULTIVITAMIN WITH MINERALS) TABS tablet Take 1 tablet by mouth daily.      . nitroGLYCERIN (NITROSTAT) 0.4 MG SL tablet Place 1 tablet (0.4 mg total) under the tongue every 5 (five) minutes as needed. For chest pain  30 tablet  12  . QUEtiapine (SEROQUEL) 25 MG tablet Take 1 tablet (25 mg total) by mouth at bedtime.  30 tablet  1  . ramipril (ALTACE) 2.5 MG capsule Take 1 capsule (2.5 mg total) by mouth daily.  30 capsule  1  . warfarin (COUMADIN) 2 MG tablet Take 1 tablet (2 mg total) by mouth daily at 6 PM.  30 tablet  1   Scheduled:  .  stroke: mapping our early stages of recovery book   Does not apply Once  . budesonide-formoterol  2 puff Inhalation BID  . carvedilol  3.125 mg Oral BID WC  . citalopram  10 mg Oral Daily  . multivitamin with minerals  1 tablet Oral Daily  . QUEtiapine  25 mg Oral QHS  . sodium chloride  3 mL Intravenous Q12H  . warfarin  3 mg Oral NOW  . Warfarin - Pharmacist Dosing Inpatient   Does not apply q1800   Infusions:  . heparin 600 Units/hr (12/16/12 0017)    Assessment: 67yo female with recent admission for stroke, now c/o slurred speech and difficulty ambulating, had tPA 3wk ago so was not candidate for tPA on this admission. No acute changes on CT, INR found to be low, failed swallow screen and cannot take Coumadin (IV warfarin on backorder). To begin heparin on 10/25.  HL today slightly SUBtherapeutic at 0.29 at rate of 600 units/hr prior to MRI for which she was subsequently off gtt  for ~1hr per nurse. Will not bolus but will increase rate by ~2 units/kg/hr.   H/H decreased but still wnl (hgb 14.6>12.4) with no reported s/s bleeding.    Goal of Therapy:  Heparin level 0.3-0.5 units/ml Monitor platelets by anticoagulation protocol: Yes    Plan:  - Increase heparin rate to 700 units/hr - F/u heparin level at 2200 this evening - Continue to monitor CBC and any s/s bleeding   Margie Billet, PharmD Clinical Pharmacist - Resident Pager: [phone] Pharmacy: [phone] 12/16/2012 1:23 PM  Addendum: Pharmacy is consulted to start coumadin. Pt. Can take PO meds. INR 1.25, hgb 12.4, plt 210. PTA dose 2mg  daily  Plan: Coumadin 3mg  po x 1 F/u daily PT/INR

## 2012-12-16 NOTE — Progress Notes (Signed)
Stroke Team Progress Note  HISTORY [name] is an 68 y.o. female who was recently discharged from [facility] after an acute stroke for which she received tPA. Work up revealed a small apical thrombus and the patient was started on Coumadin. At discharge the patient had a right facial droop, dysarthria and left sided weakness. Today when coming out of the bathroom was noted to have worsening dysarthria, facial droop and ataxia. EMS was called at that time and the patient was brought in as a code stroke. Initial NIHSS of 5. INR sub-therapeutic upon admission.    Patient was not a TPA candidate secondary to recent infarct and being on coumadin. She was admitted to [facility]  for further evaluation and treatment.  SUBJECTIVE Patient resting comfortably in bed. Denies any acute issues. Feels she is back at her baseline. Currently on IV heparin gtt.    OBJECTIVE Most recent Vital Signs: Filed Vitals:   12/16/12 0026 12/16/12 0200 12/16/12 0400 12/16/12 0600  BP: 126/78 110/68 110/65 129/81  Pulse: 90 77 80 90  Temp:  98.2 F (36.8 C) 97.8 F (36.6 C) 98.1 F (36.7 C)  TempSrc:   Oral   Resp:  18 16 18   SpO2:  100% 98% 98%   CBG (last 3)   Recent Labs  12/15/12 1838  GLUCAP 101*    IV Fluid Intake:   . heparin 600 Units/hr (12/16/12 0017)    MEDICATIONS  . budesonide-formoterol  2 puff Inhalation BID  . carvedilol  3.125 mg Oral BID WC  . citalopram  10 mg Oral Daily  . furosemide  40 mg Oral Daily  . multivitamin with minerals  1 tablet Oral Daily  . QUEtiapine  25 mg Oral QHS  . ramipril  2.5 mg Oral Daily  . sodium chloride  3 mL Intravenous Q12H  . warfarin  3 mg Oral NOW  . Warfarin - Pharmacist Dosing Inpatient   Does not apply q1800   PRN:  sodium chloride, albuterol, ALPRAZolam, metoprolol, nitroGLYCERIN, senna-docusate, sodium chloride  Diet:  NPO  DVT Prophylaxis:  Heparin gtt  CLINICALLY SIGNIFICANT STUDIES Basic Metabolic Panel:  Recent Labs Lab 12/15/12 1822  12/15/12 1837  NA 144 144  K 3.8 3.9  CL 104 106  CO2 26  --   GLUCOSE 85 88  BUN 19 19  CREATININE 1.24* 1.50*  CALCIUM 10.0  --    Liver Function Tests:  Recent Labs Lab 12/15/12 1822  AST 24  ALT 11  ALKPHOS 84  BILITOT 0.8  PROT 7.9  ALBUMIN 4.0   CBC:  Recent Labs Lab 12/15/12 1822 12/15/12 1837  WBC 5.7  --   NEUTROABS 4.2  --   HGB 14.3 14.6  HCT 42.5 43.0  MCV 92.4  --   PLT 204  --    Coagulation:  Recent Labs Lab 12/11/12 0335 12/12/12 0540 12/15/12 1822 12/16/12 0535  LABPROT 30.2* 22.8* 14.4 15.4*  INR 3.02* 2.09* 1.14 1.25   Cardiac Enzymes:  Recent Labs Lab 12/15/12 1822  TROPONINI <0.30   Urinalysis: No results found for this basename: COLORURINE, APPERANCEUR, LABSPEC, PHURINE, GLUCOSEU, HGBUR, BILIRUBINUR, KETONESUR, PROTEINUR, UROBILINOGEN, NITRITE, LEUKOCYTESUR,  in the last 168 hours Lipid Panel    Component Value Date/Time   CHOL 223* 12/16/2012 0535   TRIG 43 12/16/2012 0535   HDL 90 12/16/2012 0535   CHOLHDL 2.5 12/16/2012 0535   VLDL 9 12/16/2012 0535   LDLCALC 124* 12/16/2012 0535   HgbA1C  Lab Results  Component Value Date   HGBA1C 6.2* 11/25/2012    Urine Drug Screen:     Component Value Date/Time   LABOPIA NONE DETECTED 11/24/2012 1950   LABOPIA NEGATIVE 04/01/2009 1007   COCAINSCRNUR NONE DETECTED 11/24/2012 1950   COCAINSCRNUR  Value: POSITIVE (NOTE) Result repeated and verified. Sent for confirmatory testing* 04/01/2009 1007   LABBENZ NONE DETECTED 11/24/2012 1950   LABBENZ NEGATIVE 04/01/2009 1007   AMPHETMU NONE DETECTED 11/24/2012 1950   AMPHETMU NEGATIVE 04/01/2009 1007   THCU NONE DETECTED 11/24/2012 1950   LABBARB NONE DETECTED 11/24/2012 1950    Alcohol Level:  Recent Labs Lab 12/15/12 1822  ETH <11    Dg Chest 2 View  12/15/2012   CLINICAL DATA:  Stroke. CVA.  EXAM: CHEST  2 VIEW  COMPARISON:  11/24/2012.  FINDINGS: The cardiopericardial silhouette is upper limits of normal for projection. Aortic arch  atherosclerosis. Emphysema with bilateral pleural apical scarring. Basilar atelectasis is present on the right, mild. Mild prominence of the ascending aorta on the lateral view it is probably due to uncoiling and tortuosity.  IMPRESSION: No interval change or active cardiopulmonary disease.   Electronically Signed   By: Andreas Newport M.D.   On: 12/15/2012 21:39   Ct Head Wo Contrast  12/15/2012   ADDENDUM REPORT: 12/15/2012 18:28  ADDENDUM: Critical Value/emergent results were called by telephone at the time of interpretation on 12/15/2012 at 6:27 PM to Dr.Amico, who verbally acknowledged these results.   Electronically Signed   By: Esperanza Heir M.D.   On: 12/15/2012 18:28   12/15/2012   CLINICAL DATA:  Code stroke, slurred speech, facial droop, head TPA 3 weeks ago  EXAM: CT HEAD WITHOUT CONTRAST  TECHNIQUE: Contiguous axial images were obtained from the base of the skull through the vertex without intravenous contrast.  COMPARISON:  11/25/2012  FINDINGS: No evidence of hemorrhage or extra-axial fluid. Multiple areas of infarction identified bilaterally including anterior cerebral artery territory, right posterior parieto-occipital region, right cerebellum, left temporoparietal area, left occipital lobe, and left cerebellar hemisphere. These were all present previously. There is diffuse atrophy.  IMPRESSION: Multiple areas of subacute to chronic infarction, all of which were present on the prior study. No evidence of hemorrhage or extra-axial fluid.I paged Dr. Thad Ranger at 1815 on 12/15/12.  Electronically Signed: By: Esperanza Heir M.D. On: 12/15/2012 18:19    CT of the brain   10/25: Multiple areas of subacute to chronic infarction, all of which were  present on the prior study. No evidence of hemorrhage or extra-axial  fluid.  MRI of the brain    MRA of the brain    2D Echocardiogram    Carotid Doppler    CXR    Therapy Recommendations pending  Physical Exam   Gen: NAD CV:  RRR  Mental Status:  Alert. Speech fluent but dysarthric. Able to follow 3 step commands without difficulty.  Cranial Nerves:  II: Visual fields grossly normal, pupils equal, round, reactive to light  III,IV, VI: ptosis not present, extra-ocular motions intact bilaterally  V,VII: decrease in right NLF, facial light touch sensation normal bilaterally  VIII: hearing normal bilaterally  IX,X: gag reflex present  XI: bilateral shoulder shrug  XII: midline tongue extension  Motor:  Right : Upper extremity 5/5 Left: Upper extremity 5-/5  Lower extremity 5/5 Lower extremity 4/5  Tone and bulk:normal tone throughout; no atrophy noted  Sensory: Llight touch intact throughout, bilaterally  Deep Tendon Reflexes: Symmetric throughout  Plantars:  Right: upgoing  Left: upgoing  Cerebellar:  normal finger-to-nose. Heel-to-shin testing unable to be performed on the left  Gait: unable to test  ASSESSMENT [name] is a 67 y.o. female presenting with worsening dysarthria, ataxia, facial droop. No t-PA given as recent stroke and on coumadin. INR found to be sub-therapeutic so concern that symptoms may be related to low INR. Marland Kitchen  On coumadin prior to admission. Now on IV heparin gtt for secondary stroke prevention.  Work up underway.   TIA vs ischemic infarct  Cardiac thrombus  Hyperlipidemia  HTN  Hospital day # 1  TREATMENT/PLAN  Anticoagulation, currently on heparin gtt and coumadin  Risk factor modification  MRI/A pending  Echo and carotid doppler recently completed  Start statin  Rehab evaluation  Elspeth Cho, DO Neurology-Stroke

## 2012-12-16 NOTE — Progress Notes (Signed)
[facility]                                                                                Patient Demographics  [name], is a 67 y.o. female, DOB - December 31, 1945, [id number]  Admit date - 12/15/2012   Admitting Physician Ron Parker, MD  Outpatient Primary MD for the patient is Robynn Pane, MD  LOS - 1   Chief Complaint  Patient presents with  . Code Stroke        Assessment & Plan    1. Left-sided weakness and dysarthria likely due to another stroke. She was recently admitted for stroke and discharged on Coumadin, INR was subtherapeutic, she is on heparin drip plus Coumadin overlap, pharmacy monitoring the same. Seen by neuro case discussed with them they want to continue this combination for now. MRI MRA pending. She will be seen by PT OT and speech. We'll resume her statin once she is able to take oral medications. Full aspiration precautions for now.  Lab Results  Component Value Date   HGBA1C 6.2* 11/25/2012    Lab Results  Component Value Date   CHOL 223* 12/16/2012   HDL 90 12/16/2012   LDLCALC 124* 12/16/2012   TRIG 43 12/16/2012   CHOLHDL 2.5 12/16/2012      2. Chronic systolic heart failure EF 10-15% per recent echo. Beta blocker if tolerated by blood pressure, she is in acute renal failure and ACE/ARB cannot be given. Currently appears compensated from CHF standpoint, had few runs of V. tach asymptomatic, magnesium given, will monitor electrolytes, continue beta blocker. Cardiology requested to see the patient.   3. Hyperlipidemia. Home dose statin once taking by mouth.   4. History of COPD. Stable, nebulizer oxygen as needed.    5. History of CAD. Chest pain-free no acute issues.     Code Status: Full, will talk to family  Family Communication:    Disposition Plan: SNF   Procedures MRI MRA brain,   Consults neurology, cardiology   Medications  Scheduled Meds: .  stroke: mapping our early stages of recovery  book   Does not apply Once  . budesonide-formoterol  2 puff Inhalation BID  . carvedilol  3.125 mg Oral BID WC  . citalopram  10 mg Oral Daily  . furosemide  40 mg Oral Daily  . magnesium sulfate 1 - 4 g bolus IVPB  2 g Intravenous Once  . multivitamin with minerals  1 tablet Oral Daily  . QUEtiapine  25 mg Oral QHS  . ramipril  2.5 mg Oral Daily  . sodium chloride  3 mL Intravenous Q12H  . warfarin  3 mg Oral NOW  . Warfarin - Pharmacist Dosing Inpatient   Does not apply q1800   Continuous Infusions: . heparin 600 Units/hr (12/16/12 0017)   PRN Meds:.sodium chloride, albuterol, ALPRAZolam, metoprolol, nitroGLYCERIN, senna-docusate, sodium chloride  DVT Prophylaxis  heparin and Coumadin  Lab Results  Component Value Date   PLT 210 12/16/2012    Antibiotics     Anti-infectives   None          Subjective:   [name] today has, No headache, No chest pain, No  abdominal pain - No Nausea, No new weakness tingling or numbness, No Cough - SOB. Agrees to problems her speech and left-sided weakness.  Objective:   Filed Vitals:   12/16/12 0600 12/16/12 0800 12/16/12 0916 12/16/12 1000  BP: 129/81 116/73  110/65  Pulse: 90     Temp: 98.1 F (36.7 C)     TempSrc:      Resp: 18     SpO2: 98%  99%     Wt Readings from Last 3 Encounters:  12/12/12 39.7 kg (87 lb 8.4 oz)  11/24/12 40 kg (88 lb 2.9 oz)  06/24/12 39.644 kg (87 lb 6.4 oz)    No intake or output data in the 24 hours ending 12/16/12 1038  Exam Awake Alert, Oriented X1, does have some left-sided weakness and dysarthria, Normal affect [place].AT,PERRAL Supple Neck,No JVD, No cervical lymphadenopathy appriciated.  Symmetrical Chest wall movement, Good air movement bilaterally, CTAB RRR,No Gallops,Rubs or new Murmurs, No Parasternal Heave +ve B.Sounds, Abd Soft, Non tender, No organomegaly appriciated, No rebound - guarding or rigidity. No Cyanosis, Clubbing or edema, No new Rash or bruise      Data  Review   Micro Results No results found for this or any previous visit (from the past 240 hour(s)).  Radiology Reports Dg Chest 2 View  12/15/2012   CLINICAL DATA:  Stroke. CVA.  EXAM: CHEST  2 VIEW  COMPARISON:  11/24/2012.  FINDINGS: The cardiopericardial silhouette is upper limits of normal for projection. Aortic arch atherosclerosis. Emphysema with bilateral pleural apical scarring. Basilar atelectasis is present on the right, mild. Mild prominence of the ascending aorta on the lateral view it is probably due to uncoiling and tortuosity.  IMPRESSION: No interval change or active cardiopulmonary disease.   Electronically Signed   By: Andreas Newport M.D.   On: 12/15/2012 21:39   Ct Head Wo Contrast  12/15/2012   ADDENDUM REPORT: 12/15/2012 18:28  ADDENDUM: Critical Value/emergent results were called by telephone at the time of interpretation on 12/15/2012 at 6:27 PM to Dr.Kijowski, who verbally acknowledged these results.   Electronically Signed   By: Esperanza Heir M.D.   On: 12/15/2012 18:28   12/15/2012   CLINICAL DATA:  Code stroke, slurred speech, facial droop, head TPA 3 weeks ago  EXAM: CT HEAD WITHOUT CONTRAST  TECHNIQUE: Contiguous axial images were obtained from the base of the skull through the vertex without intravenous contrast.  COMPARISON:  11/25/2012  FINDINGS: No evidence of hemorrhage or extra-axial fluid. Multiple areas of infarction identified bilaterally including anterior cerebral artery territory, right posterior parieto-occipital region, right cerebellum, left temporoparietal area, left occipital lobe, and left cerebellar hemisphere. These were all present previously. There is diffuse atrophy.  IMPRESSION: Multiple areas of subacute to chronic infarction, all of which were present on the prior study. No evidence of hemorrhage or extra-axial fluid.I paged Dr. Thad Ranger at 1815 on 12/15/12.  Electronically Signed: By: Esperanza Heir M.D. On: 12/15/2012 18:19    Mr Brain Wo  Contrast  11/24/2012   CLINICAL DATA:  Code stroke.  Altered mental status. Dementia.  EXAM: MRI HEAD WITHOUT CONTRAST  TECHNIQUE: Multiplanar, multisequence MR imaging was performed. No intravenous contrast was administered.  COMPARISON:  CT head earlier in the day.  FINDINGS: The patient was only able to undergo diffusion imaging and a single FLAIR sequence, beyond which she could not remain motionless. Important information was obtained however.  There is evidence for an acute left superior cerebellar infarct affecting much of  the hemisphere and also a portion of the superior vermis. There is no significant mass effect on the 4th ventricle and no visible hemorrhage.  In addition, there is a 2nd, acute right posterior temporal infarction affecting the cortex and subcortical white matter. This infarction is approximately 2 cm cross-section. Again no visible in areas of hemorrhage.  Extensive encephalomalacia related to remote bihemispheric infarctions. Advanced premature atrophy and chronic microvascular ischemic change.  IMPRESSION: Acute infarction affects the left cerebellum and right posterior temporal regions. See discussion above.   Electronically Signed   By: Davonna Belling M.D.   On: 11/24/2012 18:47        CBC  Recent Labs Lab 12/15/12 1822 12/15/12 1837 12/16/12 0900  WBC 5.7  --  4.2  HGB 14.3 14.6 12.4  HCT 42.5 43.0 37.3  PLT 204  --  210  MCV 92.4  --  92.3  MCH 31.1  --  30.7  MCHC 33.6  --  33.2  RDW 13.4  --  13.5  LYMPHSABS 1.0  --   --   MONOABS 0.5  --   --   EOSABS 0.0  --   --   BASOSABS 0.0  --   --     Chemistries   Recent Labs Lab 12/15/12 1822 12/15/12 1837  NA 144 144  K 3.8 3.9  CL 104 106  CO2 26  --   GLUCOSE 85 88  BUN 19 19  CREATININE 1.24* 1.50*  CALCIUM 10.0  --   AST 24  --   ALT 11  --   ALKPHOS 84  --   BILITOT 0.8  --     ------------------------------------------------------------------------------------------------------------------ CrCl is unknown because both a height and weight (above a minimum accepted value) are required for this calculation. ------------------------------------------------------------------------------------------------------------------ No results found for this basename: HGBA1C,  in the last 72 hours ------------------------------------------------------------------------------------------------------------------  Recent Labs  12/16/12 0535  CHOL 223*  HDL 90  LDLCALC 124*  TRIG 43  CHOLHDL 2.5   ------------------------------------------------------------------------------------------------------------------ No results found for this basename: TSH, T4TOTAL, FREET3, T3FREE, THYROIDAB,  in the last 72 hours ------------------------------------------------------------------------------------------------------------------ No results found for this basename: VITAMINB12, FOLATE, FERRITIN, TIBC, IRON, RETICCTPCT,  in the last 72 hours  Coagulation profile  Recent Labs Lab 12/10/12 0600 12/11/12 0335 12/12/12 0540 12/15/12 1822 12/16/12 0535  INR 3.32* 3.02* 2.09* 1.14 1.25    No results found for this basename: DDIMER,  in the last 72 hours  Cardiac Enzymes  Recent Labs Lab 12/15/12 1822  TROPONINI <0.30   ------------------------------------------------------------------------------------------------------------------ No components found with this basename: POCBNP,      Time Spent in minutes   35   SINGH,PRASHANT K M.D on 12/16/2012 at 10:38 AM  Between 7am to 7pm - Pager - [phone]  After 7pm go to [email or web address] - password [email or web address]  And look for the night coverage person covering for me after hours  [facility] Office  [phone]

## 2012-12-16 NOTE — Progress Notes (Signed)
Patient had 10 beat run of V-tach and is asymptomatic. Dr. Thedore Mins notified. Orders given.

## 2012-12-16 NOTE — Consult Note (Signed)
[name] is an 67 y.o. female.                                             Ref: Dr. 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 HPI: 67 y.o. female who was recently discharged from [facility] after an acute stroke for which she received tPA. Work up revealed a small apical thrombus and the patient was started on Coumadin. At discharge the patient had a right facial droop, dysarthria and left sided weakness. Today when coming out of the bathroom was noted to have worsening dysarthria, facial droop and ataxia. INR sub-therapeutic on admission.   Past Medical History  Diagnosis Date  . Weight loss, unintentional   . Trouble swallowing   . Change in voice   . Substance abuse   . Dementia   . Asthma   . Shortness of breath   . Seizures     "    It has been along time "  . CHF (congestive heart failure)   . Headache(784.0)   . Hypertension   . Arthralgia   . Tremors of nervous system   . Anxiety   . Rib fractures     hx of May 2012  . Heart attack 05/21/10  . Schizophrenia   . Coronary artery disease   . Stroke 07/11/10    hx of R CVA   . COPD (chronic obstructive pulmonary disease)   . Obstructive sleep apnea   . Breast cancer 01/28/11    L breast, inv ductal/in situ, ER/PR -, Her2 -      Past Surgical History  Procedure Laterality Date  . Total abdominal hysterectomy  age 67    Family History  Problem Relation Age of Onset  . Breast cancer Mother   . Birth defects Mother     breast  . Cancer Mother     breast  . Heart disease Father     heart attack  . Heart attack Father   . Heart attack Brother   . Cancer Brother     throat, lung  . Cancer Paternal Aunt   . Cancer Maternal Grandmother     breast    Social History:  reports that she quit smoking about 4 years ago. Her smoking use included Cigarettes. She has a 50 pack-year smoking history. She has never used  smokeless tobacco. She reports that she drinks alcohol. She reports that she uses illicit drugs.  Allergies:  Allergies  Allergen Reactions  . Codeine Itching    All over the body  . Penicillins Hives and Other (See Comments)    "Whelps" per patient.  . Hydralazine     02/28/11 Family unsure if this is allergy for pt    Medications Prior to Admission  Medication Sig Dispense Refill  . albuterol (PROVENTIL) 90 MCG/ACT inhaler Inhale 2 puffs into the lungs every 6 (six) hours as needed. For wheezing       . ALPRAZolam (XANAX) 0.25 MG tablet Take 1 tablet (0.25 mg total) by mouth every 4 (four) hours as needed for anxiety.  60 tablet  0  . budesonide-formoterol (SYMBICORT) 80-4.5 MCG/ACT inhaler Inhale 2 puffs into the lungs 2 (two) times daily.      . carvedilol (COREG) 3.125 MG tablet  Take 1 tablet (3.125 mg total) by mouth 2 (two) times daily with a meal.  60 tablet  1  . citalopram (CELEXA) 10 MG tablet Take 1 tablet (10 mg total) by mouth daily.  30 tablet  1  . furosemide (LASIX) 40 MG tablet Take 40 mg by mouth daily.      . Multiple Vitamin (MULTIVITAMIN WITH MINERALS) TABS tablet Take 1 tablet by mouth daily.      . nitroGLYCERIN (NITROSTAT) 0.4 MG SL tablet Place 1 tablet (0.4 mg total) under the tongue every 5 (five) minutes as needed. For chest pain  30 tablet  12  . QUEtiapine (SEROQUEL) 25 MG tablet Take 1 tablet (25 mg total) by mouth at bedtime.  30 tablet  1  . ramipril (ALTACE) 2.5 MG capsule Take 1 capsule (2.5 mg total) by mouth daily.  30 capsule  1  . warfarin (COUMADIN) 2 MG tablet Take 1 tablet (2 mg total) by mouth daily at 6 PM.  30 tablet  1    Results for orders placed during the hospital encounter of 12/15/12 (from the past 48 hour(s))  ETHANOL     Status: None   Collection Time    12/15/12  6:22 PM      Result Value Range   Alcohol, Ethyl (B) <11  0 - 11 mg/dL   Comment:            LOWEST DETECTABLE LIMIT FOR     SERUM ALCOHOL IS 11 mg/dL     FOR MEDICAL  PURPOSES ONLY  PROTIME-INR     Status: None   Collection Time    12/15/12  6:22 PM      Result Value Range   Prothrombin Time 14.4  11.6 - 15.2 seconds   INR 1.14  0.00 - 1.49  APTT     Status: None   Collection Time    12/15/12  6:22 PM      Result Value Range   aPTT 34  24 - 37 seconds  CBC     Status: None   Collection Time    12/15/12  6:22 PM      Result Value Range   WBC 5.7  4.0 - 10.5 K/uL   RBC 4.60  3.87 - 5.11 MIL/uL   Hemoglobin 14.3  12.0 - 15.0 g/dL   HCT 16.1  09.6 - 04.5 %   MCV 92.4  78.0 - 100.0 fL   MCH 31.1  26.0 - 34.0 pg   MCHC 33.6  30.0 - 36.0 g/dL   RDW 40.9  81.1 - 91.4 %   Platelets 204  150 - 400 K/uL  DIFFERENTIAL     Status: None   Collection Time    12/15/12  6:22 PM      Result Value Range   Neutrophils Relative % 73  43 - 77 %   Neutro Abs 4.2  1.7 - 7.7 K/uL   Lymphocytes Relative 17  12 - 46 %   Lymphs Abs 1.0  0.7 - 4.0 K/uL   Monocytes Relative 9  3 - 12 %   Monocytes Absolute 0.5  0.1 - 1.0 K/uL   Eosinophils Relative 1  0 - 5 %   Eosinophils Absolute 0.0  0.0 - 0.7 K/uL   Basophils Relative 1  0 - 1 %   Basophils Absolute 0.0  0.0 - 0.1 K/uL  COMPREHENSIVE METABOLIC PANEL     Status: Abnormal   Collection Time  12/15/12  6:22 PM      Result Value Range   Sodium 144  135 - 145 mEq/L   Potassium 3.8  3.5 - 5.1 mEq/L   Chloride 104  96 - 112 mEq/L   CO2 26  19 - 32 mEq/L   Glucose, Bld 85  70 - 99 mg/dL   BUN 19  6 - 23 mg/dL   Creatinine, Ser 1.61 (*) 0.50 - 1.10 mg/dL   Calcium 09.6  8.4 - 04.5 mg/dL   Total Protein 7.9  6.0 - 8.3 g/dL   Albumin 4.0  3.5 - 5.2 g/dL   AST 24  0 - 37 U/L   ALT 11  0 - 35 U/L   Alkaline Phosphatase 84  39 - 117 U/L   Total Bilirubin 0.8  0.3 - 1.2 mg/dL   GFR calc non Af Amer 44 (*) >90 mL/min   GFR calc Af Amer 51 (*) >90 mL/min   Comment: (NOTE)     The eGFR has been calculated using the CKD EPI equation.     This calculation has not been validated in all clinical situations.      eGFR's persistently <90 mL/min signify possible Chronic Kidney     Disease.  TROPONIN I     Status: None   Collection Time    12/15/12  6:22 PM      Result Value Range   Troponin I <0.30  <0.30 ng/mL   Comment:            Due to the release kinetics of cTnI,     a negative result within the first hours     of the onset of symptoms does not rule out     myocardial infarction with certainty.     If myocardial infarction is still suspected,     repeat the test at appropriate intervals.  POCT I-STAT TROPONIN I     Status: None   Collection Time    12/15/12  6:35 PM      Result Value Range   Troponin i, poc 0.05  0.00 - 0.08 ng/mL   Comment 3            Comment: Due to the release kinetics of cTnI,     a negative result within the first hours     of the onset of symptoms does not rule out     myocardial infarction with certainty.     If myocardial infarction is still suspected,     repeat the test at appropriate intervals.  POCT I-STAT, CHEM 8     Status: Abnormal   Collection Time    12/15/12  6:37 PM      Result Value Range   Sodium 144  135 - 145 mEq/L   Potassium 3.9  3.5 - 5.1 mEq/L   Chloride 106  96 - 112 mEq/L   BUN 19  6 - 23 mg/dL   Creatinine, Ser 4.09 (*) 0.50 - 1.10 mg/dL   Glucose, Bld 88  70 - 99 mg/dL   Calcium, Ion 8.11  9.14 - 1.30 mmol/L   TCO2 25  0 - 100 mmol/L   Hemoglobin 14.6  12.0 - 15.0 g/dL   HCT 78.2  95.6 - 21.3 %  GLUCOSE, CAPILLARY     Status: Abnormal   Collection Time    12/15/12  6:38 PM      Result Value Range   Glucose-Capillary 101 (*) 70 - 99 mg/dL  HEMOGLOBIN A1C     Status: Abnormal   Collection Time    12/16/12  5:35 AM      Result Value Range   Hemoglobin A1C 6.0 (*) <5.7 %   Comment: (NOTE)                                                                               According to the ADA Clinical Practice Recommendations for 2011, when     HbA1c is used as a screening test:      >=6.5%   Diagnostic of Diabetes Mellitus                (if abnormal result is confirmed)     5.7-6.4%   Increased risk of developing Diabetes Mellitus     References:Diagnosis and Classification of Diabetes Mellitus,Diabetes     Care,2011,34(Suppl 1):S62-S69 and Standards of Medical Care in             Diabetes - 2011,Diabetes Care,2011,34 (Suppl 1):S11-S61.   Mean Plasma Glucose 126 (*) <117 mg/dL   Comment: Performed at [facility]  PROTIME-INR     Status: Abnormal   Collection Time    12/16/12  5:35 AM      Result Value Range   Prothrombin Time 15.4 (*) 11.6 - 15.2 seconds   INR 1.25  0.00 - 1.49  LIPID PANEL     Status: Abnormal   Collection Time    12/16/12  5:35 AM      Result Value Range   Cholesterol 223 (*) 0 - 200 mg/dL   Triglycerides 43  <960 mg/dL   HDL 90  >45 mg/dL   Total CHOL/HDL Ratio 2.5     VLDL 9  0 - 40 mg/dL   LDL Cholesterol 409 (*) 0 - 99 mg/dL   Comment:            Total Cholesterol/HDL:CHD Risk     Coronary Heart Disease Risk Table                         Men   Women      1/2 Average Risk   3.4   3.3      Average Risk       5.0   4.4      2 X Average Risk   9.6   7.1      3 X Average Risk  23.4   11.0                Use the calculated Patient Ratio     above and the CHD Risk Table     to determine the patient's CHD Risk.                ATP III CLASSIFICATION (LDL):      <100     mg/dL   Optimal      811-914  mg/dL   Near or Above                        Optimal      130-159  mg/dL   Borderline  160-189  mg/dL   High      >409     mg/dL   Very High  HEPARIN LEVEL (UNFRACTIONATED)     Status: Abnormal   Collection Time    12/16/12  9:00 AM      Result Value Range   Heparin Unfractionated 0.29 (*) 0.30 - 0.70 IU/mL   Comment:            IF HEPARIN RESULTS ARE BELOW     EXPECTED VALUES, AND PATIENT     DOSAGE HAS BEEN CONFIRMED,     SUGGEST FOLLOW UP TESTING     OF ANTITHROMBIN III LEVELS.  CBC     Status: None   Collection Time    12/16/12  9:00 AM      Result Value  Range   WBC 4.2  4.0 - 10.5 K/uL   RBC 4.04  3.87 - 5.11 MIL/uL   Hemoglobin 12.4  12.0 - 15.0 g/dL   HCT 81.1  91.4 - 78.2 %   MCV 92.3  78.0 - 100.0 fL   MCH 30.7  26.0 - 34.0 pg   MCHC 33.2  30.0 - 36.0 g/dL   RDW 95.6  21.3 - 08.6 %   Platelets 210  150 - 400 K/uL  PHOSPHORUS     Status: None   Collection Time    12/16/12 10:35 AM      Result Value Range   Phosphorus 3.8  2.3 - 4.6 mg/dL  TROPONIN I     Status: None   Collection Time    12/16/12 10:35 AM      Result Value Range   Troponin I <0.30  <0.30 ng/mL   Comment:            Due to the release kinetics of cTnI,     a negative result within the first hours     of the onset of symptoms does not rule out     myocardial infarction with certainty.     If myocardial infarction is still suspected,     repeat the test at appropriate intervals.   Dg Chest 2 View  12/15/2012   CLINICAL DATA:  Stroke. CVA.  EXAM: CHEST  2 VIEW  COMPARISON:  11/24/2012.  FINDINGS: The cardiopericardial silhouette is upper limits of normal for projection. Aortic arch atherosclerosis. Emphysema with bilateral pleural apical scarring. Basilar atelectasis is present on the right, mild. Mild prominence of the ascending aorta on the lateral view it is probably due to uncoiling and tortuosity.  IMPRESSION: No interval change or active cardiopulmonary disease.   Electronically Signed   By: Andreas Newport M.D.   On: 12/15/2012 21:39   Ct Head Wo Contrast  12/15/2012   ADDENDUM REPORT: 12/15/2012 18:28  ADDENDUM: Critical Value/emergent results were called by telephone at the time of interpretation on 12/15/2012 at 6:27 PM to Dr.Backstrom, who verbally acknowledged these results.   Electronically Signed   By: Esperanza Heir M.D.   On: 12/15/2012 18:28   12/15/2012   CLINICAL DATA:  Code stroke, slurred speech, facial droop, head TPA 3 weeks ago  EXAM: CT HEAD WITHOUT CONTRAST  TECHNIQUE: Contiguous axial images were obtained from the base of the skull through  the vertex without intravenous contrast.  COMPARISON:  11/25/2012  FINDINGS: No evidence of hemorrhage or extra-axial fluid. Multiple areas of infarction identified bilaterally including anterior cerebral artery territory, right posterior parieto-occipital region, right cerebellum, left temporoparietal area, left occipital lobe, and left cerebellar  hemisphere. These were all present previously. There is diffuse atrophy.  IMPRESSION: Multiple areas of subacute to chronic infarction, all of which were present on the prior study. No evidence of hemorrhage or extra-axial fluid.I paged Dr. Thad Ranger at 1815 on 12/15/12.  Electronically Signed: By: Esperanza Heir M.D. On: 12/15/2012 18:19   Mr Brain Ltd W/o Cm  12/16/2012   CLINICAL DATA:  Left-sided weakness. Multiple remote infarcts.  EXAM: MRI HEAD WITHOUT CONTRAST  TECHNIQUE: Multiplanar, multisequence MR imaging was performed. No intravenous contrast was administered.  COMPARISON:  CT head without contrast 11/25/2012. MRI brain without contrast 11/24/2012.  FINDINGS: Only a diffusion sequence was performed. The patient refused additional imaging. The notes state that the patient is a candidate for conscious sedation if further imaging is required.  The diffusion sequence demonstrates no evidence for acute or subacute infarction. The previously seen acute infarcts no longer exhibit restricted diffusion. Multiple areas of remote infarction are again noted.  IMPRESSION: 1. Diffusion-weighted imaging demonstrates no evidence for acute infarction. 2. Previously seen acute infarcts no longer exhibit restricted diffusion.   Electronically Signed   By: Gennette Pac M.D.   On: 12/16/2012 12:59    ROS Constitutional ROS: No Fever, Chills, Night Sweats, Anorexia, Pain, decreased weight.  Cardiovascular ROS: + chest pain, + dyspnea on exertion  Respiratory ROS: + cough, + shortness of breath, + wheezing + Asthma, + COPD  Neurological ROS: + CVA, multiple, weakness  and dysarthria, headaches. Psychiatric ROS: Memory loss, Anxiety, Schizophrenia and insomnia. Dermatological ROS: negative  ENT ROS: negative  Gastrointestinal ROS: Constipation  Genito-Urinary ROS: negative  Hematological and Lymphatic ROS: negative  Breast ROS: positive for inv ductal cancer of left breast, - new or changing breast lumps  Musculoskeletal ROS: Myalgias  Blood pressure 115/74, pulse 56, temperature 98.1 F (36.7 C), temperature source Oral, resp. rate 17, weight 51.2 kg (112 lb 14 oz), SpO2 99.00%. Physical exam: Constitutional: Frail elderly female appearing older than stated age.  HENT: Normocephalic. Eyes: EOM are normal.  Neck: Normal range of motion. Neck supple. No thyromegaly present. + JVD at 45 degree angle Pulmonary/Chest: breath sounds clear to auscultation, bil. Heart: S1 and S2 normal. II/VI systolic murmur. Abdominal: Soft. Bowel sounds are normal. She exhibits no distension.  Neurological: She is alert. Patient with significant dysarthria but most words intelligible with extra time. Strength LUE is grossly 4/5. LLE is 3/5 She did follow simple commands. Patient oriented x 2 to basic questions. Left facial weakness.  Extremity;No leg edema. Skin: Skin is warm and dry.  Psychiatric: She has a normal mood and affect.   Assessment/Plan Status post acute left cerebellar and right posterior temporal infarct  Status post TPA  Sub-therapeutic INR on Warfarin  Probable cardioembolic small non-Q-wave myocardial infarction  Severe nonischemic dilated cardiomyopathy  Hypertension  COPD  History ofTobacco abuse  Hypercholesteremia  History of dementia  History of schizophrenia  History of CA of breast  History of polysubstance abuse  Cachexia  Discussed with patient and family need for warfarin but increased risk of bleed with ataxia.  Patient and family willing to take risk of warfarin use and will try to provide 24 hour supervision. Continue warfarin  with INR goal of 2-3. Continue low dose carvedilol. Add low dose lisinopril.  Therasa Lorenzi S 12/16/2012, 3:57 PM

## 2012-12-16 NOTE — Progress Notes (Signed)
Today we only was able to get a DWI  On [name]; which is enough to prove new cva. This pt has had multiple MRs she refuses scanning during all of  Them also. No meds on order was refusing further imaging scan aborted. H/o of dementia ; however this pt knew she wanted out of scanner. I have had this pt before I believe I recall her getting ativan with very little appreciable affect. If a full scan is needed this pt is a conscious sedation candidate .

## 2012-12-16 NOTE — Evaluation (Signed)
Physical Therapy Evaluation Patient Details Name: [name] MRN: [id number] DOB: Jul 10, 1945 Today's Date: 12/16/2012 Time: 2841-3244 PT Time Calculation (min): 13 min  PT Assessment / Plan / Recommendation History of Present Illness  Patient is a 67 yo female admitted with slurred speech and difficulty ambulating.  Patient with recent h/o CVA, with treatment on Inpatient Rehab center.  Was discharged home 12/12/12..  Patient with h/o CVA, dementia, COPD, CAD, schizophrenia.  Clinical Impression  Patient presents with problems listed below.  Will benefit from acute PT to maximize independence prior to discharge.  Recommend SNF at discharge for continued therapy.    PT Assessment  Patient needs continued PT services    Follow Up Recommendations  SNF;Supervision/Assistance - 24 hour    Does the patient have the potential to tolerate intense rehabilitation      Barriers to Discharge        Equipment Recommendations  None recommended by PT    Recommendations for Other Services     Frequency Min 2X/week    Precautions / Restrictions Precautions Precautions: Fall Restrictions Weight Bearing Restrictions: No   Pertinent Vitals/Pain       Mobility  Bed Mobility Bed Mobility: Rolling Right;Right Sidelying to Sit;Sitting - Scoot to Edge of Bed Rolling Right: 4: Min guard;With rail Right Sidelying to Sit: 4: Min guard;With rails;HOB elevated Sitting - Scoot to Edge of Bed: 4: Min guard Details for Bed Mobility Assistance: Verbal and tactile cues for technique.  Increased time for mobility.  Assist for safety.  Patient sat EOB x 8 minutes. Transfers Transfers: Not assessed Modified Rankin (Stroke Patients Only) Pre-Morbid Rankin Score: Moderately severe disability Modified Rankin: Moderately severe disability    Exercises     PT Diagnosis: Difficulty walking;Generalized weakness;Hemiplegia non-dominant side  PT Problem List: Decreased strength;Decreased activity  tolerance;Decreased balance;Decreased mobility;Decreased knowledge of use of DME;Decreased cognition PT Treatment Interventions: DME instruction;Gait training;Functional mobility training;Balance training;Cognitive remediation;Patient/family education     PT Goals(Current goals can be found in the care plan section) Acute Rehab PT Goals Patient Stated Goal: go home PT Goal Formulation: With patient Time For Goal Achievement: 12/30/12 Potential to Achieve Goals: Fair  Visit Information  Last PT Received On: 12/16/12 Assistance Needed: +2 History of Present Illness: Patient is a 67 yo female admitted with slurred speech and difficulty ambulating.  Patient with recent h/o CVA, with treatment on Inpatient Rehab center.  Was discharged home 12/12/12..  Patient with h/o CVA, dementia, COPD, CAD, schizophrenia.       Prior Functioning  Home Living Family/patient expects to be discharged to:: Skilled nursing facility Prior Function Level of Independence: Needs assistance Gait / Transfers Assistance Needed: D/C from CIR primarily at w/c level.  Able to do only minimal ambulation. Communication / Swallowing Assistance Needed: Dysarthria Communication Communication: Expressive difficulties Dominant Hand: Right    Cognition  Cognition Arousal/Alertness: Awake/alert Behavior During Therapy: Restless Overall Cognitive Status: No family/caregiver present to determine baseline cognitive functioning Area of Impairment: Orientation;Attention;Memory;Following commands;Safety/judgement;Problem solving Orientation Level: Disoriented to;Time;Situation Current Attention Level: Sustained Memory: Decreased short-term memory Following Commands: Follows one step commands with increased time Safety/Judgement: Decreased awareness of safety;Decreased awareness of deficits Problem Solving: Slow processing;Difficulty sequencing;Requires verbal cues    Extremity/Trunk Assessment Upper Extremity  Assessment Upper Extremity Assessment: Generalized weakness;Defer to OT evaluation (LUE able to move against gravity) Lower Extremity Assessment Lower Extremity Assessment: LLE deficits/detail RLE Deficits / Details: generalized weakness LLE Deficits / Details: Noted decreased strength, increased flexor tone.  Unable to  move against gravity.  LLE pulled into flexion as patient tried to sit EOB. LLE Coordination: decreased gross motor (Patient unable to perform heel-to-shin)   Balance Balance Balance Assessed: Yes Static Sitting Balance Static Sitting - Balance Support: No upper extremity supported;Feet supported Static Sitting - Level of Assistance: 5: Stand by assistance Static Sitting - Comment/# of Minutes: 8 minutes with good balance.  Patient able to drink thickened juice and maintain balance at EOB.  Nursing in to complete meal.  End of Session PT - End of Session Activity Tolerance: Patient limited by fatigue Patient left: in bed;with call bell/phone within reach;with nursing/sitter in room (sitting EOB with nursing) Nurse Communication: Mobility status  GP     Vena Austria 12/16/2012, 3:27 PM Durenda Hurt. Renaldo Fiddler, [facility] Acute Rehab Services Pager [phone]

## 2012-12-17 LAB — PROTIME-INR: INR: 1.26 (ref 0.00–1.49)

## 2012-12-17 LAB — CBC
HCT: 39.7 % (ref 36.0–46.0)
Hemoglobin: 13.1 g/dL (ref 12.0–15.0)
MCV: 92.8 fL (ref 78.0–100.0)
Platelets: 202 10*3/uL (ref 150–400)
RDW: 13.7 % (ref 11.5–15.5)
WBC: 4.1 10*3/uL (ref 4.0–10.5)

## 2012-12-17 LAB — BASIC METABOLIC PANEL
BUN: 13 mg/dL (ref 6–23)
CO2: 28 mEq/L (ref 19–32)
Chloride: 104 mEq/L (ref 96–112)
Creatinine, Ser: 0.99 mg/dL (ref 0.50–1.10)
GFR calc Af Amer: 67 mL/min — ABNORMAL LOW (ref >90)
Glucose, Bld: 79 mg/dL (ref 70–99)
Potassium: 3.8 mEq/L (ref 3.5–5.1)

## 2012-12-17 LAB — HEPARIN LEVEL (UNFRACTIONATED): Heparin Unfractionated: 1.14 IU/mL — ABNORMAL HIGH (ref 0.30–0.70)

## 2012-12-17 MED ORDER — WARFARIN SODIUM 3 MG PO TABS
3.0000 mg | ORAL_TABLET | Freq: Once | ORAL | Status: AC
  Administered 2012-12-17: 3 mg via ORAL
  Filled 2012-12-17: qty 1

## 2012-12-17 MED ORDER — SODIUM CHLORIDE 0.9 % IV SOLN: 250.0000 mL | INTRAVENOUS | Status: DC

## 2012-12-17 MED ORDER — HEPARIN (PORCINE) IN NACL 100-0.45 UNIT/ML-% IJ SOLN
400.0000 [IU]/h | INTRAMUSCULAR | Status: DC
  Administered 2012-12-17: 400 [IU]/h via INTRAVENOUS
  Filled 2012-12-17: qty 250

## 2012-12-17 MED ORDER — SODIUM CHLORIDE 0.9 % IV SOLN
250.0000 mL | INTRAVENOUS | Status: AC
  Administered 2012-12-17 (×2): 250 mL via INTRAVENOUS

## 2012-12-17 MED ORDER — HEPARIN (PORCINE) IN NACL 100-0.45 UNIT/ML-% IJ SOLN
600.0000 [IU]/h | INTRAMUSCULAR | Status: DC
  Filled 2012-12-17: qty 250

## 2012-12-17 MED ORDER — ATORVASTATIN CALCIUM 10 MG PO TABS
10.0000 mg | ORAL_TABLET | Freq: Every day | ORAL | Status: DC
  Administered 2012-12-17 – 2012-12-19 (×3): 10 mg via ORAL
  Filled 2012-12-17 (×5): qty 1

## 2012-12-17 NOTE — Progress Notes (Signed)
UR Completed Ivet Guerrieri Graves-Bigelow, RN,BSN [phone]  

## 2012-12-17 NOTE — Progress Notes (Signed)
[facility]                                                                                Patient Demographics  [name], is a 67 y.o. female, DOB - 04-Jan-1946, WUJ:[id number]  Admit date - 12/15/2012   Admitting Physician Ron Parker, MD  Outpatient Primary MD for the patient is Robynn Pane, MD  LOS - 2   Chief Complaint  Patient presents with  . Code Stroke        Assessment & Plan    1. Left-sided weakness and dysarthria ? due to another stroke. She was recently admitted for stroke and discharged on Coumadin, INR was subtherapeutic, she is on heparin drip plus Coumadin overlap, pharmacy monitoring the same. Seen by neuro case discussed with them they want to continue this combination for now. MRI noted. She is been seen by PT OT and speech. Place on statin, Dys 1 diet with Full aspiration precautions.    Lab Results  Component Value Date   HGBA1C 6.0* 12/16/2012    Lab Results  Component Value Date   CHOL 223* 12/16/2012   HDL 90 12/16/2012   LDLCALC 124* 12/16/2012   TRIG 43 12/16/2012   CHOLHDL 2.5 12/16/2012       2. Chronic systolic heart failure EF 10-15% per recent echo. Beta blocker if tolerated by blood pressure, low-dose ACE inhibitor as tolerated by blood pressure. Currently appears compensated from CHF standpoint, had few runs of V. tach asymptomatic, magnesium given, will monitor electrolytes, continue beta blocker. Cardiology consulted and following.    3. Hyperlipidemia. Placed  Lipitor.     4. History of COPD. Stable, nebulizer oxygen as needed.     5. History of CAD. Chest pain-free no acute issues.     6. Hypotension. Gentle IV fluids, parameters written to hold blood pressure medications.      Code Status: Full, will talk to family  Family Communication:    Disposition Plan: SNF   Procedures MRI MRA brain,   Consults neurology, cardiology   Medications  Scheduled Meds: .  budesonide-formoterol  2 puff Inhalation BID  . carvedilol  3.125 mg Oral BID WC  . citalopram  10 mg Oral Daily  . lisinopril  2.5 mg Oral Daily  . multivitamin with minerals  1 tablet Oral Daily  . QUEtiapine  25 mg Oral QHS  . sodium chloride  3 mL Intravenous Q12H  . Warfarin - Pharmacist Dosing Inpatient   Does not apply q1800   Continuous Infusions: . sodium chloride    . heparin 600 Units/hr (12/17/12 0109)   PRN Meds:.albuterol, ALPRAZolam, metoprolol, nitroGLYCERIN, RESOURCE THICKENUP CLEAR, senna-docusate, sodium chloride  DVT Prophylaxis  heparin and Coumadin  Lab Results  Component Value Date   PLT 202 12/17/2012    Antibiotics     Anti-infectives   None        Subjective:   [name] today has, No headache, No chest pain, No abdominal pain - No Nausea, No new weakness tingling or numbness, No Cough - SOB. Agrees to problems her speech and left-sided weakness.  Objective:   Filed Vitals:   12/16/12 2008 12/17/12 0001 12/17/12  0403 12/17/12 0800  BP:  116/76 101/72 90/50  Pulse:  50 46 45  Temp:  98.3 F (36.8 C) 98.4 F (36.9 C) 97.8 F (36.6 C)  TempSrc:  Oral Oral Oral  Resp:  18 18 18   Height: 5\' 1"  (1.549 m)     Weight:      SpO2:  100% 100% 100%    Wt Readings from Last 3 Encounters:  12/16/12 51.2 kg (112 lb 14 oz)  12/12/12 39.7 kg (87 lb 8.4 oz)  11/24/12 40 kg (88 lb 2.9 oz)     Intake/Output Summary (Last 24 hours) at 12/17/12 0912 Last data filed at 12/16/12 1757  Gross per 24 hour  Intake 559.83 ml  Output      0 ml  Net 559.83 ml    Exam Awake Alert, Oriented X1, does have some left-sided weakness and dysarthria, Normal affect [place].AT,PERRAL Supple Neck,No JVD, No cervical lymphadenopathy appriciated.  Symmetrical Chest wall movement, Good air movement bilaterally, CTAB RRR,No Gallops,Rubs or new Murmurs, No Parasternal Heave +ve B.Sounds, Abd Soft, Non tender, No organomegaly appriciated, No rebound - guarding or  rigidity. No Cyanosis, Clubbing or edema, No new Rash or bruise      Data Review   Micro Results No results found for this or any previous visit (from the past 240 hour(s)).  Radiology Reports Dg Chest 2 View  12/15/2012   CLINICAL DATA:  Stroke. CVA.  EXAM: CHEST  2 VIEW  COMPARISON:  11/24/2012.  FINDINGS: The cardiopericardial silhouette is upper limits of normal for projection. Aortic arch atherosclerosis. Emphysema with bilateral pleural apical scarring. Basilar atelectasis is present on the right, mild. Mild prominence of the ascending aorta on the lateral view it is probably due to uncoiling and tortuosity.  IMPRESSION: No interval change or active cardiopulmonary disease.   Electronically Signed   By: Andreas Newport M.D.   On: 12/15/2012 21:39   Ct Head Wo Contrast  12/15/2012   ADDENDUM REPORT: 12/15/2012 18:28  ADDENDUM: Critical Value/emergent results were called by telephone at the time of interpretation on 12/15/2012 at 6:27 PM to Dr.Guyette, who verbally acknowledged these results.   Electronically Signed   By: Esperanza Heir M.D.   On: 12/15/2012 18:28   12/15/2012   CLINICAL DATA:  Code stroke, slurred speech, facial droop, head TPA 3 weeks ago  EXAM: CT HEAD WITHOUT CONTRAST  TECHNIQUE: Contiguous axial images were obtained from the base of the skull through the vertex without intravenous contrast.  COMPARISON:  11/25/2012  FINDINGS: No evidence of hemorrhage or extra-axial fluid. Multiple areas of infarction identified bilaterally including anterior cerebral artery territory, right posterior parieto-occipital region, right cerebellum, left temporoparietal area, left occipital lobe, and left cerebellar hemisphere. These were all present previously. There is diffuse atrophy.  IMPRESSION: Multiple areas of subacute to chronic infarction, all of which were present on the prior study. No evidence of hemorrhage or extra-axial fluid.I paged Dr. Thad Ranger at 1815 on 12/15/12.   Electronically Signed: By: Esperanza Heir M.D. On: 12/15/2012 18:19    Mr Brain Wo Contrast  11/24/2012   CLINICAL DATA:  Code stroke.  Altered mental status. Dementia.  EXAM: MRI HEAD WITHOUT CONTRAST  TECHNIQUE: Multiplanar, multisequence MR imaging was performed. No intravenous contrast was administered.  COMPARISON:  CT head earlier in the day.  FINDINGS: The patient was only able to undergo diffusion imaging and a single FLAIR sequence, beyond which she could not remain motionless. Important information was obtained however.  There  is evidence for an acute left superior cerebellar infarct affecting much of the hemisphere and also a portion of the superior vermis. There is no significant mass effect on the 4th ventricle and no visible hemorrhage.  In addition, there is a 2nd, acute right posterior temporal infarction affecting the cortex and subcortical white matter. This infarction is approximately 2 cm cross-section. Again no visible in areas of hemorrhage.  Extensive encephalomalacia related to remote bihemispheric infarctions. Advanced premature atrophy and chronic microvascular ischemic change.  IMPRESSION: Acute infarction affects the left cerebellum and right posterior temporal regions. See discussion above.   Electronically Signed   By: Davonna Belling M.D.   On: 11/24/2012 18:47        CBC  Recent Labs Lab 12/15/12 1822 12/15/12 1837 12/16/12 0900 12/17/12 0524  WBC 5.7  --  4.2 4.1  HGB 14.3 14.6 12.4 13.1  HCT 42.5 43.0 37.3 39.7  PLT 204  --  210 202  MCV 92.4  --  92.3 92.8  MCH 31.1  --  30.7 30.6  MCHC 33.6  --  33.2 33.0  RDW 13.4  --  13.5 13.7  LYMPHSABS 1.0  --   --   --   MONOABS 0.5  --   --   --   EOSABS 0.0  --   --   --   BASOSABS 0.0  --   --   --     Chemistries   Recent Labs Lab 12/15/12 1822 12/15/12 1837 12/17/12 0524  NA 144 144 140  K 3.8 3.9 3.8  CL 104 106 104  CO2 26  --  28  GLUCOSE 85 88 79  BUN 19 19 13   CREATININE 1.24* 1.50* 0.99   CALCIUM 10.0  --  9.3  MG  --   --  2.2  AST 24  --   --   ALT 11  --   --   ALKPHOS 84  --   --   BILITOT 0.8  --   --    ------------------------------------------------------------------------------------------------------------------ estimated creatinine clearance is 41.6 ml/min (by C-G formula based on Cr of 0.99). ------------------------------------------------------------------------------------------------------------------  Recent Labs  12/16/12 0535  HGBA1C 6.0*   ------------------------------------------------------------------------------------------------------------------  Recent Labs  12/16/12 0535  CHOL 223*  HDL 90  LDLCALC 124*  TRIG 43  CHOLHDL 2.5   ------------------------------------------------------------------------------------------------------------------  Recent Labs  12/16/12 1035  TSH 0.377   ------------------------------------------------------------------------------------------------------------------ No results found for this basename: VITAMINB12, FOLATE, FERRITIN, TIBC, IRON, RETICCTPCT,  in the last 72 hours  Coagulation profile  Recent Labs Lab 12/11/12 0335 12/12/12 0540 12/15/12 1822 12/16/12 0535 12/17/12 0524  INR 3.02* 2.09* 1.14 1.25 1.26    No results found for this basename: DDIMER,  in the last 72 hours  Cardiac Enzymes  Recent Labs Lab 12/16/12 1035 12/16/12 1600 12/16/12 2117  TROPONINI <0.30 <0.30 <0.30   ------------------------------------------------------------------------------------------------------------------ No components found with this basename: POCBNP,      Time Spent in minutes   35   Susa Raring K M.D on 12/17/2012 at 9:12 AM  Between 7am to 7pm - Pager - [phone]  After 7pm go to [email or web address] - password [email or web address]  And look for the night coverage person covering for me after hours  [facility] Office  [phone]

## 2012-12-17 NOTE — Evaluation (Signed)
Occupational Therapy Evaluation Patient Details Name: [name] MRN: [id number] DOB: May 03, 1945 Today's Date: 12/17/2012 Time: 4098-1191 OT Time Calculation (min): 26 min  OT Assessment / Plan / Recommendation History of present illness Patient is a 67 yo female admitted with slurred speech and difficulty ambulating.  Patient with recent h/o CVA, with treatment on Inpatient Rehab center.  Was discharged home 12/12/12..  Patient with h/o CVA, dementia, COPD, CAD, schizophrenia.   Clinical Impression   Pt presents w/ dx as above. She is demonstrating deficits in ability to perform ADL's and functional transfers (see problem list below) and should benefit from acute OT to assist in maximizing independence in ADL's in prep for d/c to next venue.    OT Assessment  Patient needs continued OT Services    Follow Up Recommendations  SNF    Barriers to Discharge      Equipment Recommendations  None recommended by OT    Recommendations for Other Services    Frequency  Min 2X/week    Precautions / Restrictions Precautions Precautions: Fall Restrictions Weight Bearing Restrictions: No   Pertinent Vitals/Pain No c/o    ADL  Eating/Feeding: Performed;Set up Where Assessed - Eating/Feeding: Chair Grooming: Performed;Wash/dry hands;Minimal assistance;Set up Where Assessed - Grooming: Unsupported sitting Upper Body Bathing: Simulated;Minimal assistance Where Assessed - Upper Body Bathing: Unsupported sitting Lower Body Bathing: Moderate assistance Where Assessed - Lower Body Bathing: Supported sit to stand Upper Body Dressing: Simulated;Minimal assistance Where Assessed - Upper Body Dressing: Unsupported sitting Lower Body Dressing: Performed;Moderate assistance Where Assessed - Lower Body Dressing: Supported sit to Pharmacist, hospital: Performed;Moderate assistance Toilet Transfer Method: Stand pivot Toilet Transfer Equipment: Bedside commode Toileting - Clothing  Manipulation and Hygiene: Performed;+1 Total assistance Where Assessed - Toileting Clothing Manipulation and Hygiene: Sit to stand from 3-in-1 or toilet;Standing Tub/Shower Transfer Method: Not assessed Equipment Used: Gait belt Transfers/Ambulation Related to ADLs: Min A sit - stand. Mod A for beginning mobility/SPT, pt w/ decreased awareness of deficits and fearful of falling, posterior lean. Pt likes to do things her own way. ADL Comments: Pt demonstrates deficits in ADL's affecting ADL's & transfers. Pt was educated in role of OT & then participated in toileting, transfers and self feeding this am.    OT Diagnosis: Generalized weakness;Cognitive deficits;Disturbance of vision;Ataxia  OT Problem List: Decreased strength;Decreased range of motion;Decreased activity tolerance;Impaired balance (sitting and/or standing);Impaired vision/perception;Decreased coordination;Decreased cognition;Decreased safety awareness;Cardiopulmonary status limiting activity;Impaired tone;Impaired UE functional use OT Treatment Interventions: Self-care/ADL training;Therapeutic exercise;Neuromuscular education;DME and/or AE instruction;Therapeutic activities;Cognitive remediation/compensation;Visual/perceptual remediation/compensation;Patient/family education;Balance training   OT Goals(Current goals can be found in the care plan section) Acute Rehab OT Goals Patient Stated Goal: go home Time For Goal Achievement: 12/31/12 Potential to Achieve Goals: Good  Visit Information  Last OT Received On: 12/17/12 Assistance Needed: +1 History of Present Illness: Patient is a 67 yo female admitted with slurred speech and difficulty ambulating.  Patient with recent h/o CVA, with treatment on Inpatient Rehab center.  Was discharged home 12/12/12..  Patient with h/o CVA, dementia, COPD, CAD, schizophrenia.       Prior Functioning     Home Living Family/patient expects to be discharged to:: Skilled nursing facility Living  Arrangements: Other relatives;Other (Comment) (Niece) Type of Home: Apartment Home Layout: One level Home Equipment: Bedside commode;Walker - 2 wheels;Cane - single point;Shower seat Prior Function Level of Independence: Needs assistance Gait / Transfers Assistance Needed: D/C from CIR primarily at w/c level.  Able to do only minimal ambulation. ADL's /  Homemaking Assistance Needed: Assistance PRN. Niece lives w/ pt per pt report Communication / Swallowing Assistance Needed: Dysarthria Communication Communication: Expressive difficulties Dominant Hand: Right    Vision/Perception Vision - History Baseline Vision: Wears glasses only for reading Patient Visual Report: No change from baseline;Other (comment)   Cognition  Cognition Arousal/Alertness: Awake/alert Behavior During Therapy: Restless Overall Cognitive Status: No family/caregiver present to determine baseline cognitive functioning Area of Impairment: Orientation;Attention;Memory;Following commands;Safety/judgement;Problem solving Orientation Level: Disoriented to;Time;Situation Current Attention Level: Sustained Memory: Decreased short-term memory Following Commands: Follows one step commands with increased time Safety/Judgement: Decreased awareness of safety;Decreased awareness of deficits Problem Solving: Slow processing;Difficulty sequencing;Requires verbal cues    Extremity/Trunk Assessment Upper Extremity Assessment Upper Extremity Assessment: LUE deficits/detail;Generalized weakness LUE Deficits / Details: ataxia worse LUE than RUE LUE Coordination: decreased fine motor;decreased gross motor Lower Extremity Assessment Lower Extremity Assessment: Defer to PT evaluation RLE Deficits / Details: generalized weakness LLE Deficits / Details: Noted decreased strength, increased flexor tone.  Unable to move against gravity.  LLE pulled into flexion as patient tried to sit EOB. LLE Coordination: decreased gross motor     Mobility Bed Mobility Bed Mobility: Rolling Right;Right Sidelying to Sit;Sitting - Scoot to Edge of Bed Rolling Right: 4: Min guard;With rail Right Sidelying to Sit: 4: Min guard;With rails;HOB elevated Supine to Sit: 4: Min guard;With rails;HOB elevated Sitting - Scoot to Edge of Bed: 4: Min guard;With rail Details for Bed Mobility Assistance: Verbal and tactile cues for technique.  Increased time for mobility.  Assist for safety.  Patient sat EOB x 5 minutes, then stated she needed to use 3:1, sat on commode x46min followed by transfer to chair. Transfers Transfers: Sit to Stand;Stand to Sit Sit to Stand: With upper extremity assist;From bed;From chair/3-in-1;4: Min assist;3: Mod assist Stand to Sit: 3: Mod assist;To chair/3-in-1;To bed Details for Transfer Assistance: Cues for safety, hand placement & SPT. Posterior lean.        Balance Balance Balance Assessed: Yes Static Sitting Balance Static Sitting - Balance Support: Right upper extremity supported;Feet supported Static Sitting - Level of Assistance: 5: Stand by assistance Static Sitting - Comment/# of Minutes:  (Pt sitting at EOB followed by sitting on 3:1 & chair) Static Standing Balance Static Standing - Balance Support: Bilateral upper extremity supported;During functional activity Static Standing - Level of Assistance: 4: Min assist   End of Session OT - End of Session Equipment Utilized During Treatment: Gait belt;Oxygen (2 L O2 via nasal cannula) Activity Tolerance: Patient tolerated treatment well Patient left: in chair;with call bell/phone within reach;Other (comment) (Getting blood drawn) Nurse Communication: Mobility status;Other (comment) (Need for chair alarm)  GO     Alm Bustard 12/17/2012, 11:01 AM

## 2012-12-17 NOTE — Progress Notes (Signed)
ANTICOAGULATION CONSULT NOTE - Follow Up Consult  Pharmacy Consult for heparin / warfarin Indication: CVA and apical thrombus  Labs:  Recent Labs  12/15/12 1822 12/15/12 1837 12/16/12 0535 12/16/12 0900 12/16/12 1035 12/16/12 1600 12/16/12 2117 12/17/12 0524 12/17/12 0925  HGB 14.3 14.6  --  12.4  --   --   --  13.1  --   HCT 42.5 43.0  --  37.3  --   --   --  39.7  --   PLT 204  --   --  210  --   --   --  202  --   APTT 34  --   --   --   --   --   --   --   --   LABPROT 14.4  --  15.4*  --   --   --   --  15.5*  --   INR 1.14  --  1.25  --   --   --   --  1.26  --   HEPARINUNFRC  --   --   --  0.29*  --   --  1.02*  --  1.14*  CREATININE 1.24* 1.50*  --   --   --   --   --  0.99  --   TROPONINI <0.30  --   --   --  <0.30 <0.30 <0.30  --   --     Assessment: 67yo female admitted with subtherapeutic INR and recurrent CVA / apical thrombus.  She was started on heparin with low goal; of note lab stuck pt in left hand and iv heparin drip in right.  HL supratherapuetic 1.14.  No bleeding noted, CBC stable.   Goal of Therapy:  Heparin level 0.3-0.5 units/ml  INR 2-3  Plan:   Hold heparin drip x2 hr Restart heparin drip 500 uts/hr Recheck HL in 6hr from restart Warfarin 3mg  x1 Daily CBC, HL, Protime  Leota Sauers Pharm.D. CPP, BCPS Clinical Pharmacist [phone] 12/17/2012 11:39 AM

## 2012-12-17 NOTE — Progress Notes (Signed)
Stroke Team Progress Note  HISTORY [name] is an 67 y.o. female who was recently discharged from [facility] after an acute stroke for which she received tPA. Work up revealed a small apical thrombus and the patient was started on Coumadin. At discharge the patient had a right facial droop, dysarthria and left sided weakness. 12/15/2012 when coming out of the bathroom was noted to have worsening dysarthria, facial droop and ataxia. EMS was called at that time and the patient was brought in as a code stroke. Initial NIHSS of 5. INR sub-therapeutic upon admission. Patient was not a TPA candidate secondary to recent infarct and being on coumadin. She was admitted to [facility]W for further evaluation and treatment.  SUBJECTIVE Her therapist is at the bedside.  Overall she feels her condition is stable. She cannot tell us if she was taking her medication appropriately or not - she states her niece gives them to her.  OBJECTIVE Most recent Vital Signs: Filed Vitals:   12/16/12 2008 12/17/12 0001 12/17/12 0403 12/17/12 0800  BP:  116/76 101/72 90/50  Pulse:  50 46 45  Temp:  98.3 F (36.8 C) 98.4 F (36.9 C) 97.8 F (36.6 C)  TempSrc:  Oral Oral Oral  Resp:  18 18 18   Height: 5\' 1"  (1.549 m)     Weight:      SpO2:  100% 100% 100%   CBG (last 3)   Recent Labs  12/15/12 1838  GLUCAP 101*    IV Fluid Intake:   . sodium chloride    . heparin 600 Units/hr (12/17/12 0109)    MEDICATIONS  . budesonide-formoterol  2 puff Inhalation BID  . carvedilol  3.125 mg Oral BID WC  . citalopram  10 mg Oral Daily  . lisinopril  2.5 mg Oral Daily  . multivitamin with minerals  1 tablet Oral Daily  . QUEtiapine  25 mg Oral QHS  . sodium chloride  3 mL Intravenous Q12H  . Warfarin - Pharmacist Dosing Inpatient   Does not apply q1800   PRN:  albuterol, ALPRAZolam, metoprolol, nitroGLYCERIN, RESOURCE THICKENUP CLEAR, senna-docusate, sodium chloride  Diet:  Dysphagia 1 nectar thick liquids Activity:   DVT  Prophylaxis:  IV heparin and coumadin  CLINICALLY SIGNIFICANT STUDIES Basic Metabolic Panel:   Recent Labs Lab 12/15/12 1822 12/15/12 1837 12/16/12 1035 12/17/12 0524  NA 144 144  --  140  K 3.8 3.9  --  3.8  CL 104 106  --  104  CO2 26  --   --  28  GLUCOSE 85 88  --  79  BUN 19 19  --  13  CREATININE 1.24* 1.50*  --  0.99  CALCIUM 10.0  --   --  9.3  MG  --   --   --  2.2  PHOS  --   --  3.8  --    Liver Function Tests:   Recent Labs Lab 12/15/12 1822  AST 24  ALT 11  ALKPHOS 84  BILITOT 0.8  PROT 7.9  ALBUMIN 4.0   CBC:  Recent Labs Lab 12/15/12 1822  12/16/12 0900 12/17/12 0524  WBC 5.7  --  4.2 4.1  NEUTROABS 4.2  --   --   --   HGB 14.3  < > 12.4 13.1  HCT 42.5  < > 37.3 39.7  MCV 92.4  --  92.3 92.8  PLT 204  --  210 202  < > = values in this interval not  displayed. Coagulation:   Recent Labs Lab 12/12/12 0540 12/15/12 1822 12/16/12 0535 12/17/12 0524  LABPROT 22.8* 14.4 15.4* 15.5*  INR 2.09* 1.14 1.25 1.26   Cardiac Enzymes:   Recent Labs Lab 12/16/12 1035 12/16/12 1600 12/16/12 2117  TROPONINI <0.30 <0.30 <0.30   Urinalysis: No results found for this basename: COLORURINE, APPERANCEUR, LABSPEC, PHURINE, GLUCOSEU, HGBUR, BILIRUBINUR, KETONESUR, PROTEINUR, UROBILINOGEN, NITRITE, LEUKOCYTESUR,  in the last 168 hours Lipid Panel    Component Value Date/Time   CHOL 223* 12/16/2012 0535   TRIG 43 12/16/2012 0535   HDL 90 12/16/2012 0535   CHOLHDL 2.5 12/16/2012 0535   VLDL 9 12/16/2012 0535   LDLCALC 124* 12/16/2012 0535   HgbA1C  Lab Results  Component Value Date   HGBA1C 6.0* 12/16/2012    Urine Drug Screen:     Component Value Date/Time   LABOPIA NONE DETECTED 11/24/2012 1950   LABOPIA NEGATIVE 04/01/2009 1007   COCAINSCRNUR NONE DETECTED 11/24/2012 1950   COCAINSCRNUR  Value: POSITIVE (NOTE) Result repeated and verified. Sent for confirmatory testing* 04/01/2009 1007   LABBENZ NONE DETECTED 11/24/2012 1950   LABBENZ NEGATIVE  04/01/2009 1007   AMPHETMU NONE DETECTED 11/24/2012 1950   AMPHETMU NEGATIVE 04/01/2009 1007   THCU NONE DETECTED 11/24/2012 1950   LABBARB NONE DETECTED 11/24/2012 1950    Alcohol Level:   Recent Labs Lab 12/15/12 1822  ETH <11   CT of the brain  12/15/2012   Multiple areas of subacute to chronic infarction, all of which were present on the prior study. No evidence of hemorrhage or extra-axial fluid  MRI of the brain  12/16/2012   1. Diffusion-weighted imaging demonstrates no evidence for acute infarction. 2. Previously seen acute infarcts no longer exhibit restricted diffusion.     MRA of the brain    2D Echocardiogram    Carotid Doppler    CXR  12/15/2012   No interval change or active cardiopulmonary disease.  EKG  Marked sinus bradycardia. Left bundle branch block. No significant change since last tracing 12/15/12.   Therapy Recommendations SNF  Physical Exam  Gen: NAD  CV: RRR  Mental Status:  Alert. Speech fluent but dysarthric. Able to follow 3 step commands without difficulty.  Cranial Nerves:  II: Visual fields grossly normal, pupils equal, round, reactive to light  III,IV, VI: ptosis not present, extra-ocular motions intact bilaterally  V,VII: decrease in right NLF, facial light touch sensation normal bilaterally  VIII: hearing normal bilaterally  IX,X: gag reflex present  XI: bilateral shoulder shrug  XII: midline tongue extension  Motor:  Right : Upper extremity 5/5 Left: Upper extremity 5-/5  Lower extremity 5/5 Lower extremity 4/5  Tone and bulk:normal tone throughout; no atrophy noted  Sensory: Llight touch intact throughout, bilaterally  Deep Tendon Reflexes: Symmetric throughout  Plantars:  Right: upgoing Left: upgoing  Cerebellar:  normal finger-to-nose. Heel-to-shin testing unable to be performed on the left  Gait: unable to test   ASSESSMENT [name] is a 67 y.o. female presenting with dysarthria and worsening right facial droop with  ataxia. Imaging confirms no new acute infarct. 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 She was on warfarin prior to admission, subtherapeutic on arrival, INR 1.14. Now on warfarin and heparin for secondary stroke prevention. Patient with resultant severe dysarthria.   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 Hx  right brain stroke May 2012 with deficits resolved. Hypertension Chronic systolic heart failure EF 10-15% per recent echo Hyperlipidemia, LDL 124, on statin PTA, now on statin, goal LDL < 100 (< 70 for diabetics) CAD - MI 2012  Baseline dementia  Hx seizures  Hospital day # 2  TREATMENT/PLAN  Continue warfarin and heparin for secondary stroke prevention until INR therapeutic.   Confirm medication being administered appropriately by niece.  Will follow up tomorrow.  Annie Main, MSN, RN, ANVP-BC, ANP-BC, [facility] Pager: [phone] 12/17/2012 9:11 AM  I have personally obtained a history, examined the patient, evaluated imaging results, and formulated the assessment and plan of care. I agree with the above. Delia Heady, MD

## 2012-12-17 NOTE — Progress Notes (Signed)
ANTICOAGULATION CONSULT NOTE - Follow Up Consult  Pharmacy Consult for heparin Indication: CVA and apical thrombus  Labs:  Recent Labs  12/15/12 1822 12/15/12 1837 12/16/12 0535 12/16/12 0900 12/16/12 1035 12/16/12 1600 12/16/12 2117  HGB 14.3 14.6  --  12.4  --   --   --   HCT 42.5 43.0  --  37.3  --   --   --   PLT 204  --   --  210  --   --   --   APTT 34  --   --   --   --   --   --   LABPROT 14.4  --  15.4*  --   --   --   --   INR 1.14  --  1.25  --   --   --   --   HEPARINUNFRC  --   --   --  0.29*  --   --  1.02*  CREATININE 1.24* 1.50*  --   --   --   --   --   TROPONINI <0.30  --   --   --  <0.30 <0.30 <0.30    Assessment: 67yo female now supratherapeutic on heparin with low goal; of note lab stuck pt 2in below heparin infusion site which may have affected lab value.  Goal of Therapy:  Heparin level 0.3-0.5 units/ml   Plan:  Will hold heparin gtt x40 min then reduce gtt to 600 units/hr and check level in 8hr.  Vernard Gambles, PharmD, BCPS  12/17/2012,12:25 AM

## 2012-12-17 NOTE — Progress Notes (Signed)
Subjective:  Patient denies any chest pain or shortness of breath still has some difficulty with speech  Objective:  Vital Signs in the last 24 hours: Temp:  [97.3 F (36.3 C)-98.4 F (36.9 C)] 97.4 F (36.3 C) (10/27 1306) Pulse Rate:  [45-67] 52 (10/27 1306) Resp:  [16-18] 18 (10/27 1306) BP: (90-153)/(50-88) 153/78 mmHg (10/27 1306) SpO2:  [95 %-100 %] 100 % (10/27 1306)  Intake/Output from previous day: 10/26 0701 - 10/27 0700 In: 559.8 [P.O.:360; I.V.:149.8; IV Piggyback:50] Out: -  Intake/Output from this shift: Total I/O In: 363 [P.O.:360; I.V.:3] Out: 200 [Urine:200]  Physical Exam: Neck: no adenopathy, no carotid bruit, no JVD and supple, symmetrical, trachea midline Lungs: Decreased breath sound at bases Heart: regular rate and rhythm, S1, S2 normal and Soft systolic murmur noted no S3 gallop Abdomen: soft, non-tender; bowel sounds normal; no masses,  no organomegaly Extremities: extremities normal, atraumatic, no cyanosis or edema  Lab Results:  Recent Labs  12/16/12 0900 12/17/12 0524  WBC 4.2 4.1  HGB 12.4 13.1  PLT 210 202    Recent Labs  12/15/12 1822 12/15/12 1837 12/17/12 0524  NA 144 144 140  K 3.8 3.9 3.8  CL 104 106 104  CO2 26  --  28  GLUCOSE 85 88 79  BUN 19 19 13   CREATININE 1.24* 1.50* 0.99    Recent Labs  12/16/12 1600 12/16/12 2117  TROPONINI <0.30 <0.30   Hepatic Function Panel  Recent Labs  12/15/12 1822  PROT 7.9  ALBUMIN 4.0  AST 24  ALT 11  ALKPHOS 84  BILITOT 0.8    Recent Labs  12/16/12 0535  CHOL 223*   No results found for this basename: PROTIME,  in the last 72 hours  Imaging: Imaging results have been reviewed and Dg Chest 2 View  12/15/2012   CLINICAL DATA:  Stroke. CVA.  EXAM: CHEST  2 VIEW  COMPARISON:  11/24/2012.  FINDINGS: The cardiopericardial silhouette is upper limits of normal for projection. Aortic arch atherosclerosis. Emphysema with bilateral pleural apical scarring. Basilar  atelectasis is present on the right, mild. Mild prominence of the ascending aorta on the lateral view it is probably due to uncoiling and tortuosity.  IMPRESSION: No interval change or active cardiopulmonary disease.   Electronically Signed   By: Andreas Newport M.D.   On: 12/15/2012 21:39   Ct Head Wo Contrast  12/15/2012   ADDENDUM REPORT: 12/15/2012 18:28  ADDENDUM: Critical Value/emergent results were called by telephone at the time of interpretation on 12/15/2012 at 6:27 PM to Dr.Sapien, who verbally acknowledged these results.   Electronically Signed   By: Esperanza Heir M.D.   On: 12/15/2012 18:28   12/15/2012   CLINICAL DATA:  Code stroke, slurred speech, facial droop, head TPA 3 weeks ago  EXAM: CT HEAD WITHOUT CONTRAST  TECHNIQUE: Contiguous axial images were obtained from the base of the skull through the vertex without intravenous contrast.  COMPARISON:  11/25/2012  FINDINGS: No evidence of hemorrhage or extra-axial fluid. Multiple areas of infarction identified bilaterally including anterior cerebral artery territory, right posterior parieto-occipital region, right cerebellum, left temporoparietal area, left occipital lobe, and left cerebellar hemisphere. These were all present previously. There is diffuse atrophy.  IMPRESSION: Multiple areas of subacute to chronic infarction, all of which were present on the prior study. No evidence of hemorrhage or extra-axial fluid.I paged Dr. Thad Ranger at 1815 on 12/15/12.  Electronically Signed: By: Esperanza Heir M.D. On: 12/15/2012 18:19   Mr Brain  Ltd W/o Cm  12/16/2012   CLINICAL DATA:  Left-sided weakness. Multiple remote infarcts.  EXAM: MRI HEAD WITHOUT CONTRAST  TECHNIQUE: Multiplanar, multisequence MR imaging was performed. No intravenous contrast was administered.  COMPARISON:  CT head without contrast 11/25/2012. MRI brain without contrast 11/24/2012.  FINDINGS: Only a diffusion sequence was performed. The patient refused additional imaging.  The notes state that the patient is a candidate for conscious sedation if further imaging is required.  The diffusion sequence demonstrates no evidence for acute or subacute infarction. The previously seen acute infarcts no longer exhibit restricted diffusion. Multiple areas of remote infarction are again noted.  IMPRESSION: 1. Diffusion-weighted imaging demonstrates no evidence for acute infarction. 2. Previously seen acute infarcts no longer exhibit restricted diffusion.   Electronically Signed   By: Gennette Pac M.D.   On: 12/16/2012 12:59    Cardiac Studies:  Assessment/Plan:  Status post recent cardiogenic acute left cerebellar and right posterior temporal infarct  Status post recurrent TIA/subtherapeutic INR Severe nonischemic dilated cardiomyopathy History of cardioembolic small non-Q-wave myocardial infarction the past Hypertension COPD Strip tobacco abuse Hypercholesteremia History of schizophrenia History of CA of breast History of polysubstance abuse Cachexia Plan Agree with IV heparin/Coumadin OT PT consult   LOS: 2 days    [name] N 12/17/2012, 3:16 PM

## 2012-12-17 NOTE — Progress Notes (Signed)
ANTICOAGULATION CONSULT NOTE - Follow Up Consult  Pharmacy Consult for Heparin Indication: stroke and apical thrombus  Allergies  Allergen Reactions  . Codeine Itching    All over the body  . Penicillins Hives and Other (See Comments)    "Whelps" per patient.  . Hydralazine     02/28/11 Family unsure if this is allergy for pt    Patient Measurements: Height: 5\' 1"  (154.9 cm) Weight: 112 lb 14 oz (51.2 kg) IBW/kg (Calculated) : 47.8 Heparin Dosing Weight: 51.2 kg  Vital Signs: Temp: 98 F (36.7 C) (10/27 2000) Temp src: Oral (10/27 2000) BP: 115/80 mmHg (10/27 2000) Pulse Rate: 54 (10/27 2000)  Labs:  Recent Labs  12/15/12 1822 12/15/12 1837 12/16/12 0535  12/16/12 0900 12/16/12 1035 12/16/12 1600 12/16/12 2117 12/17/12 0524 12/17/12 0925 12/17/12 1920  HGB 14.3 14.6  --   --  12.4  --   --   --  13.1  --   --   HCT 42.5 43.0  --   --  37.3  --   --   --  39.7  --   --   PLT 204  --   --   --  210  --   --   --  202  --   --   APTT 34  --   --   --   --   --   --   --   --   --   --   LABPROT 14.4  --  15.4*  --   --   --   --   --  15.5*  --   --   INR 1.14  --  1.25  --   --   --   --   --  1.26  --   --   HEPARINUNFRC  --   --   --   < > 0.29*  --   --  1.02*  --  1.14* 0.58  CREATININE 1.24* 1.50*  --   --   --   --   --   --  0.99  --   --   TROPONINI <0.30  --   --   --   --  <0.30 <0.30 <0.30  --   --   --   < > = values in this interval not displayed.  Estimated Creatinine Clearance: 41.6 ml/min (by C-G formula based on Cr of 0.99).   Medications:  Infusions:  . sodium chloride 250 mL (12/17/12 1043)  . heparin 500 Units/hr (12/17/12 1437)    Assessment: 67 yo F admitted with recurrent CVA and subtherapeutic INR.  Pt initiated on heparin and initially had supratherapeutic levels >1.  Heparin rate adjusted and heparin level is now only slightly above goal.  No bleeding noted.  Will adjust rate accordingly.  Goal of Therapy:  Heparin level 0.3-0.5  units/ml Monitor platelets by anticoagulation protocol: Yes   Plan:  Reduce heparin infusion to 400 units/hr. Heparin level with AM labs.  [employer], Pharm.D., BCPS Clinical Pharmacist Pager [phone] 12/17/2012 8:25 PM

## 2012-12-17 NOTE — Plan of Care (Signed)
Problem: Progression Outcomes Goal: Initial discharge plan initiated Outcome: Completed/Met Date Met:  12/17/12 CSW consulted for possible STSNF placement

## 2012-12-17 NOTE — Progress Notes (Signed)
[facility]  Patient Status: Active (receiving services up to time of hospitalization)  [facility] is providing the following services: RN, PT, OT and ST  If patient discharges after hours, please call [phone].   Wynelle Bourgeois 12/17/2012, 12:25 PM

## 2012-12-18 LAB — BASIC METABOLIC PANEL
BUN: 8 mg/dL (ref 6–23)
CO2: 23 mEq/L (ref 19–32)
Chloride: 107 mEq/L (ref 96–112)
GFR calc Af Amer: 72 mL/min — ABNORMAL LOW (ref >90)
GFR calc non Af Amer: 62 mL/min — ABNORMAL LOW (ref >90)
Glucose, Bld: 77 mg/dL (ref 70–99)
Potassium: 4 mEq/L (ref 3.5–5.1)
Sodium: 140 mEq/L (ref 135–145)

## 2012-12-18 LAB — RAPID URINE DRUG SCREEN, HOSP PERFORMED
Amphetamines: NOT DETECTED
Opiates: NOT DETECTED
Tetrahydrocannabinol: NOT DETECTED

## 2012-12-18 LAB — URINALYSIS, ROUTINE W REFLEX MICROSCOPIC
Glucose, UA: NEGATIVE mg/dL
Hgb urine dipstick: NEGATIVE
Nitrite: NEGATIVE
Protein, ur: NEGATIVE mg/dL
Specific Gravity, Urine: 1.017 (ref 1.005–1.030)
pH: 5.5 (ref 5.0–8.0)

## 2012-12-18 LAB — CBC
HCT: 33.7 % — ABNORMAL LOW (ref 36.0–46.0)
Hemoglobin: 11.4 g/dL — ABNORMAL LOW (ref 12.0–15.0)
MCHC: 33.8 g/dL (ref 30.0–36.0)
Platelets: 187 10*3/uL (ref 150–400)
RBC: 3.68 MIL/uL — ABNORMAL LOW (ref 3.87–5.11)
WBC: 3.1 10*3/uL — ABNORMAL LOW (ref 4.0–10.5)

## 2012-12-18 LAB — URINE MICROSCOPIC-ADD ON

## 2012-12-18 LAB — HEPARIN LEVEL (UNFRACTIONATED): Heparin Unfractionated: 0.38 IU/mL (ref 0.30–0.70)

## 2012-12-18 LAB — MAGNESIUM: Magnesium: 1.7 mg/dL (ref 1.5–2.5)

## 2012-12-18 LAB — PROTIME-INR: INR: 1.39 (ref 0.00–1.49)

## 2012-12-18 MED ORDER — WARFARIN SODIUM 3 MG PO TABS
3.0000 mg | ORAL_TABLET | Freq: Once | ORAL | Status: AC
  Administered 2012-12-18: 3 mg via ORAL
  Filled 2012-12-18: qty 1

## 2012-12-18 MED ORDER — ENOXAPARIN SODIUM 40 MG/0.4ML ~~LOC~~ SOLN
35.0000 mg | Freq: Two times a day (BID) | SUBCUTANEOUS | Status: DC
  Administered 2012-12-18: 35 mg via SUBCUTANEOUS
  Filled 2012-12-18 (×2): qty 0.4

## 2012-12-18 MED ORDER — SODIUM CHLORIDE 0.9 % IV SOLN: 250.0000 mL | INTRAVENOUS | Status: DC

## 2012-12-18 MED ORDER — ENOXAPARIN SODIUM 60 MG/0.6ML ~~LOC~~ SOLN
1.0000 mg/kg | Freq: Two times a day (BID) | SUBCUTANEOUS | Status: DC
  Filled 2012-12-18 (×2): qty 0.6

## 2012-12-18 MED ORDER — ENOXAPARIN SODIUM 40 MG/0.4ML ~~LOC~~ SOLN
35.0000 mg | Freq: Two times a day (BID) | SUBCUTANEOUS | Status: DC
  Administered 2012-12-18 – 2012-12-19 (×3): 35 mg via SUBCUTANEOUS
  Filled 2012-12-18 (×6): qty 0.4

## 2012-12-18 MED ORDER — SODIUM CHLORIDE 0.9 % IV SOLN
250.0000 mL | INTRAVENOUS | Status: AC
  Administered 2012-12-18: 250 mL via INTRAVENOUS

## 2012-12-18 NOTE — Progress Notes (Addendum)
ANTICOAGULATION CONSULT NOTE - Follow Up Consult  Pharmacy Consult for Coumadin and Lovenox Indication: CVA and apical thrombus  Allergies  Allergen Reactions  . Codeine Itching    All over the body  . Penicillins Hives and Other (See Comments)    "Whelps" per patient.  . Hydralazine     02/28/11 Family unsure if this is allergy for pt    Patient Measurements: Height: 5\' 1"  (154.9 cm) Weight: 112 lb 14 oz (51.2 kg) IBW/kg (Calculated) : 47.8 Heparin Dosing Weight: 51kg  Vital Signs: Temp: 97.9 F (36.6 C) (10/28 0900) Temp src: Oral (10/28 0900) BP: 106/70 mmHg (10/28 0900) Pulse Rate: 50 (10/28 0900)  Labs:  Recent Labs  12/15/12 1822 12/15/12 1837 12/16/12 0535  12/16/12 0900 12/16/12 1035 12/16/12 1600 12/16/12 2117 12/17/12 0524 12/17/12 0925 12/17/12 1920 12/18/12 0530  HGB 14.3 14.6  --   --  12.4  --   --   --  13.1  --   --  11.4*  HCT 42.5 43.0  --   --  37.3  --   --   --  39.7  --   --  33.7*  PLT 204  --   --   --  210  --   --   --  202  --   --  187  APTT 34  --   --   --   --   --   --   --   --   --   --   --   LABPROT 14.4  --  15.4*  --   --   --   --   --  15.5*  --   --  16.7*  INR 1.14  --  1.25  --   --   --   --   --  1.26  --   --  1.39  HEPARINUNFRC  --   --   --   < > 0.29*  --   --  1.02*  --  1.14* 0.58 0.38  CREATININE 1.24* 1.50*  --   --   --   --   --   --  0.99  --   --  0.93  TROPONINI <0.30  --   --   --   --  <0.30 <0.30 <0.30  --   --   --   --   < > = values in this interval not displayed.  Estimated Creatinine Clearance: 44.3 ml/min (by C-G formula based on Cr of 0.93).  Assessment: 67yof with recent L cerebellar and R posterior ischemic embolic infarcts 10/4 due to apical thrombus and started on coumadin. Developed stroke like symptoms 10/25, imaging confirmed no new infarct, INR subtherapeutic 1.14, diagnosed with TIA. Coumadin resumed with heparin bridge. Heparin now to be switched to lovenox for ease of use (ok per  neuro since no new stroke this admission). INR remains subtherapeutic but is trending up. Renal function stable with CrCl 36ml/min.  Goal of Therapy:  Anti-Xa level 0.3-0.6 drawn 4 hours after LMWH given Monitor platelets by anticoagulation protocol: Yes   Plan:  1) D/C heparin (done by RN ~ 1100) 2) ~ 1200, begin lovenox 50mg  sq q12 (1mg /kg q12) 3) Repeat coumadin 3mg  x 1 4) INR in AM, CBC q72h  Fredrik Rigger 12/18/2012,10:58 AM   Patient re-weighed. Weight is actually 82.3lbs or 37kg. Will adjust lovenox dose accordingly.   Plan: 1) Change lovenox to 35mg  sq q12  Fredrik Rigger 12/18/2012, 11:22 AM

## 2012-12-18 NOTE — Progress Notes (Addendum)
[facility]                                                                                Patient Demographics  [name] Clark, is a 67 y.o. female, DOB - 1945-10-21, ZOX:[id number]  Admit date - 12/15/2012   Admitting Physician Ron Parker, MD  Outpatient Primary MD for the patient is Robynn Pane, MD  LOS - 3   Chief Complaint  Patient presents with  . Code Stroke      Brief summary  Is a 67 year old African American female with recent CVA causing left-sided weakness, who has history of chronic severe systolic heart failure with history of apical thrombus, who was on Coumadin for secondary prevention of stroke, lives at home was brought in for another episode of dysarthria and worsening of her left-sided weakness, this was thought to be secondary to TIA, her INR was subtherapeutic, she was seen by neurology and she was placed on Coumadin and heparin overlap. I will transition her to Coumadin and Lovenox overlap for ease of use. She is extremely deconditioned weak and malnourished, I have requested social work to arrange for nursing home/rehabilitation placement, patient is agreeable for the same.    Assessment & Plan    1. Left-sided weakness and dysarthria ? due to another stroke. She was recently admitted for stroke and discharged on Coumadin, INR was subtherapeutic, she is on heparin drip plus Coumadin overlap to switch her from heparin to Lovenox today, pharmacy monitoring the same. Seen by neuro case discussed with them they want to continue her on Coumadin rather than a newer agent. MRI noted. She is been seen by PT OT and speech. Placed on statin, Dys 1 diet with Full aspiration precautions. She is extremely weak, malnourished and deconditioned, she will require and benefit from rehabilitation placement. Discussed with patient in detail she is agreeable for the same.    Lab Results  Component Value Date   HGBA1C 6.0* 12/16/2012    Lab Results  Component  Value Date   CHOL 223* 12/16/2012   HDL 90 12/16/2012   LDLCALC 124* 12/16/2012   TRIG 43 12/16/2012   CHOLHDL 2.5 12/16/2012       2. Chronic systolic heart failure EF 10-15% per recent echo history of apical thrombus.  Beta blocker if tolerated by blood pressure, low-dose ACE inhibitor as tolerated by blood pressure. Currently appears compensated from CHF standpoint, had few runs of V. tach asymptomatic, magnesium given, will monitor electrolytes, continue beta blocker. Cardiology consulted and following.    3. Hyperlipidemia. Placed  Lipitor.     4. History of COPD. Stable, nebulizer oxygen as needed.     5. History of CAD. Chest pain-free no acute issues.     6. Hypotension. Approved with Gentle IV fluids, parameters written to hold blood pressure medications.      Code Status: Full   Family Communication:    Disposition Plan: SNF patient is agreeable   Procedures MRI MRA brain,   Consults neurology, cardiology   Medications  Scheduled Meds: . atorvastatin  10 mg Oral q1800  . budesonide-formoterol  2 puff Inhalation BID  . carvedilol  3.125 mg Oral BID WC  .  citalopram  10 mg Oral Daily  . lisinopril  2.5 mg Oral Daily  . multivitamin with minerals  1 tablet Oral Daily  . QUEtiapine  25 mg Oral QHS  . sodium chloride  3 mL Intravenous Q12H  . Warfarin - Pharmacist Dosing Inpatient   Does not apply q1800   Continuous Infusions: . sodium chloride    . heparin 400 Units/hr (12/17/12 2157)   PRN Meds:.albuterol, ALPRAZolam, metoprolol, nitroGLYCERIN, RESOURCE THICKENUP CLEAR, senna-docusate, sodium chloride  DVT Prophylaxis  heparin and Coumadin  Lab Results  Component Value Date   PLT 187 12/18/2012    Antibiotics     Anti-infectives   None        Subjective:   [name] Clark today has, No headache, No chest pain, No abdominal pain - No Nausea, No new weakness tingling or numbness, No Cough - SOB. Agrees to problems her speech  and left-sided weakness.  Objective:   Filed Vitals:   12/18/12 0500 12/18/12 0800 12/18/12 0900 12/18/12 0923  BP: 118/79  106/70   Pulse: 48 45 50   Temp: 98.3 F (36.8 C)  97.9 F (36.6 C)   TempSrc: Oral  Oral   Resp: 18  16   Height:      Weight:      SpO2: 96%  97% 100%    Wt Readings from Last 3 Encounters:  12/16/12 51.2 kg (112 lb 14 oz)  12/12/12 39.7 kg (87 lb 8.4 oz)  11/24/12 40 kg (88 lb 2.9 oz)     Intake/Output Summary (Last 24 hours) at 12/18/12 1031 Last data filed at 12/18/12 7829  Gross per 24 hour  Intake 1007.05 ml  Output   1100 ml  Net -92.95 ml    Exam Awake Alert, Oriented X1, does have some left-sided weakness and dysarthria, Normal affect [place].AT,PERRAL Supple Neck,No JVD, No cervical lymphadenopathy appriciated.  Symmetrical Chest wall movement, Good air movement bilaterally, CTAB RRR,No Gallops,Rubs or new Murmurs, No Parasternal Heave +ve B.Sounds, Abd Soft, Non tender, No organomegaly appriciated, No rebound - guarding or rigidity. No Cyanosis, Clubbing or edema, No new Rash or bruise      Data Review   Micro Results No results found for this or any previous visit (from the past 240 hour(s)).  Radiology Reports Dg Chest 2 View  12/15/2012   CLINICAL DATA:  Stroke. CVA.  EXAM: CHEST  2 VIEW  COMPARISON:  11/24/2012.  FINDINGS: The cardiopericardial silhouette is upper limits of normal for projection. Aortic arch atherosclerosis. Emphysema with bilateral pleural apical scarring. Basilar atelectasis is present on the right, mild. Mild prominence of the ascending aorta on the lateral view it is probably due to uncoiling and tortuosity.  IMPRESSION: No interval change or active cardiopulmonary disease.   Electronically Signed   By: Andreas Newport M.D.   On: 12/15/2012 21:39   Ct Head Wo Contrast  12/15/2012   ADDENDUM REPORT: 12/15/2012 18:28  ADDENDUM: Critical Value/emergent results were called by telephone at the time of  interpretation on 12/15/2012 at 6:27 PM to Dr.Harmon, who verbally acknowledged these results.   Electronically Signed   By: Esperanza Heir M.D.   On: 12/15/2012 18:28   12/15/2012   CLINICAL DATA:  Code stroke, slurred speech, facial droop, head TPA 3 weeks ago  EXAM: CT HEAD WITHOUT CONTRAST  TECHNIQUE: Contiguous axial images were obtained from the base of the skull through the vertex without intravenous contrast.  COMPARISON:  11/25/2012  FINDINGS: No evidence of hemorrhage  or extra-axial fluid. Multiple areas of infarction identified bilaterally including anterior cerebral artery territory, right posterior parieto-occipital region, right cerebellum, left temporoparietal area, left occipital lobe, and left cerebellar hemisphere. These were all present previously. There is diffuse atrophy.  IMPRESSION: Multiple areas of subacute to chronic infarction, all of which were present on the prior study. No evidence of hemorrhage or extra-axial fluid.I paged Dr. Thad Ranger at 1815 on 12/15/12.  Electronically Signed: By: Esperanza Heir M.D. On: 12/15/2012 18:19    Mr Brain Wo Contrast  11/24/2012   CLINICAL DATA:  Code stroke.  Altered mental status. Dementia.  EXAM: MRI HEAD WITHOUT CONTRAST  TECHNIQUE: Multiplanar, multisequence MR imaging was performed. No intravenous contrast was administered.  COMPARISON:  CT head earlier in the day.  FINDINGS: The patient was only able to undergo diffusion imaging and a single FLAIR sequence, beyond which she could not remain motionless. Important information was obtained however.  There is evidence for an acute left superior cerebellar infarct affecting much of the hemisphere and also a portion of the superior vermis. There is no significant mass effect on the 4th ventricle and no visible hemorrhage.  In addition, there is a 2nd, acute right posterior temporal infarction affecting the cortex and subcortical white matter. This infarction is approximately 2 cm cross-section.  Again no visible in areas of hemorrhage.  Extensive encephalomalacia related to remote bihemispheric infarctions. Advanced premature atrophy and chronic microvascular ischemic change.  IMPRESSION: Acute infarction affects the left cerebellum and right posterior temporal regions. See discussion above.   Electronically Signed   By: Davonna Belling M.D.   On: 11/24/2012 18:47        CBC  Recent Labs Lab 12/15/12 1822 12/15/12 1837 12/16/12 0900 12/17/12 0524 12/18/12 0530  WBC 5.7  --  4.2 4.1 3.1*  HGB 14.3 14.6 12.4 13.1 11.4*  HCT 42.5 43.0 37.3 39.7 33.7*  PLT 204  --  210 202 187  MCV 92.4  --  92.3 92.8 91.6  MCH 31.1  --  30.7 30.6 31.0  MCHC 33.6  --  33.2 33.0 33.8  RDW 13.4  --  13.5 13.7 13.6  LYMPHSABS 1.0  --   --   --   --   MONOABS 0.5  --   --   --   --   EOSABS 0.0  --   --   --   --   BASOSABS 0.0  --   --   --   --     Chemistries   Recent Labs Lab 12/15/12 1822 12/15/12 1837 12/17/12 0524 12/18/12 0530  NA 144 144 140 140  K 3.8 3.9 3.8 4.0  CL 104 106 104 107  CO2 26  --  28 23  GLUCOSE 85 88 79 77  BUN 19 19 13 8   CREATININE 1.24* 1.50* 0.99 0.93  CALCIUM 10.0  --  9.3 8.6  MG  --   --  2.2 1.7  AST 24  --   --   --   ALT 11  --   --   --   ALKPHOS 84  --   --   --   BILITOT 0.8  --   --   --    ------------------------------------------------------------------------------------------------------------------ estimated creatinine clearance is 44.3 ml/min (by C-G formula based on Cr of 0.93). ------------------------------------------------------------------------------------------------------------------  Recent Labs  12/16/12 0535  HGBA1C 6.0*   ------------------------------------------------------------------------------------------------------------------  Recent Labs  12/16/12 0535  CHOL 223*  HDL 90  LDLCALC  124*  TRIG 43  CHOLHDL 2.5    ------------------------------------------------------------------------------------------------------------------  Recent Labs  12/16/12 1035  TSH 0.377   ------------------------------------------------------------------------------------------------------------------ No results found for this basename: VITAMINB12, FOLATE, FERRITIN, TIBC, IRON, RETICCTPCT,  in the last 72 hours  Coagulation profile  Recent Labs Lab 12/12/12 0540 12/15/12 1822 12/16/12 0535 12/17/12 0524 12/18/12 0530  INR 2.09* 1.14 1.25 1.26 1.39    No results found for this basename: DDIMER,  in the last 72 hours  Cardiac Enzymes  Recent Labs Lab 12/16/12 1035 12/16/12 1600 12/16/12 2117  TROPONINI <0.30 <0.30 <0.30   ------------------------------------------------------------------------------------------------------------------ No components found with this basename: POCBNP,      Time Spent in minutes   35   SINGH,PRASHANT K M.D on 12/18/2012 at 10:31 AM  Between 7am to 7pm - Pager - [phone]  After 7pm go to [email or web address] - password [email or web address]  And look for the night coverage person covering for me after hours  [facility] Office  [phone]

## 2012-12-18 NOTE — Progress Notes (Addendum)
Clinical Social Work Department CLINICAL SOCIAL WORK PLACEMENT NOTE 12/18/2012  Patient:  [name], [name]  Account Number:  [id number] Admit date:  12/15/2012  Clinical Social Worker:  Sharol Harness, Theresia Majors  Date/time:  12/18/2012 11:00 AM  Clinical Social Work is seeking post-discharge placement for this patient at the following level of care:   SKILLED NURSING   (*CSW will update this form in Epic as items are completed)   12/18/2012  Patient/family provided with [facility] Department of Clinical Social Work's list of facilities offering this level of care within the geographic area requested by the patient (or if unable, by the patient's family).  12/18/2012  Patient/family informed of their freedom to choose among providers that offer the needed level of care, that participate in Medicare, Medicaid or managed care program needed by the patient, have an available bed and are willing to accept the patient.  12/18/2012  Patient/family informed of [facility]' ownership interest in [facility], as well as of the fact that they are under no obligation to receive care at this facility.  PASARR submitted to EDS on 12/18/2012 PASARR number received from EDS on 12/19/2012  FL2 transmitted to all facilities in geographic area requested by pt/family on  12/18/2012 FL2 transmitted to all facilities within larger geographic area on   Patient informed that his/her managed care company has contracts with or will negotiate with  certain facilities, including the following:     Patient/family informed of bed offers received:  12/19/2012 Patient chooses bed at [facility] Physician recommends and patient chooses bed at    Patient to be transferred to [facility] 12/20/2012   Patient to be transferred to facility by  [facility]  The following physician request were entered in Epic:   Additional Comments:  Bashar Milam, LCSWA [phone]

## 2012-12-18 NOTE — Progress Notes (Signed)
Stroke Team Progress Note  HISTORY [name] is an 67 y.o. female who was recently discharged from [facility] after an acute stroke for which she received tPA. Work up revealed a small apical thrombus and the patient was started on Coumadin. At discharge the patient had a right facial droop, dysarthria and left sided weakness. 12/15/2012 when coming out of the bathroom was noted to have worsening dysarthria, facial droop and ataxia. EMS was called at that time and the patient was brought in as a code stroke. Initial NIHSS of 5. INR sub-therapeutic upon admission. Patient was not a TPA candidate secondary to recent infarct and being on coumadin. She was admitted to 3W for further evaluation and treatment.  SUBJECTIVE Pt lying in the bed. No new complaints.  OBJECTIVE Most recent Vital Signs: Filed Vitals:   12/17/12 1713 12/17/12 2000 12/18/12 0500 12/18/12 0800  BP: 130/84 115/80 118/79   Pulse: 55 54 48 45  Temp:  98 F (36.7 C) 98.3 F (36.8 C)   TempSrc:  Oral Oral   Resp:  16 18   Height:      Weight:      SpO2:  100% 96%    CBG (last 3)   Recent Labs  12/15/12 1838  GLUCAP 101*    IV Fluid Intake:   . heparin 400 Units/hr (12/17/12 2157)    MEDICATIONS  . atorvastatin  10 mg Oral q1800  . budesonide-formoterol  2 puff Inhalation BID  . carvedilol  3.125 mg Oral BID WC  . citalopram  10 mg Oral Daily  . lisinopril  2.5 mg Oral Daily  . multivitamin with minerals  1 tablet Oral Daily  . QUEtiapine  25 mg Oral QHS  . sodium chloride  3 mL Intravenous Q12H  . Warfarin - Pharmacist Dosing Inpatient   Does not apply q1800   PRN:  albuterol, ALPRAZolam, metoprolol, nitroGLYCERIN, RESOURCE THICKENUP CLEAR, senna-docusate, sodium chloride  Diet:  Dysphagia 1 nectar thick liquids Activity:   DVT Prophylaxis:  IV heparin and coumadin  CLINICALLY SIGNIFICANT STUDIES Basic Metabolic Panel:   Recent Labs Lab 12/16/12 1035 12/17/12 0524 12/18/12 0530  NA  --  140 140   K  --  3.8 4.0  CL  --  104 107  CO2  --  28 23  GLUCOSE  --  79 77  BUN  --  13 8  CREATININE  --  0.99 0.93  CALCIUM  --  9.3 8.6  MG  --  2.2 1.7  PHOS 3.8  --   --    Liver Function Tests:   Recent Labs Lab 12/15/12 1822  AST 24  ALT 11  ALKPHOS 84  BILITOT 0.8  PROT 7.9  ALBUMIN 4.0   CBC:  Recent Labs Lab 12/15/12 1822  12/17/12 0524 12/18/12 0530  WBC 5.7  < > 4.1 3.1*  NEUTROABS 4.2  --   --   --   HGB 14.3  < > 13.1 11.4*  HCT 42.5  < > 39.7 33.7*  MCV 92.4  < > 92.8 91.6  PLT 204  < > 202 187  < > = values in this interval not displayed. Coagulation:   Recent Labs Lab 12/15/12 1822 12/16/12 0535 12/17/12 0524 12/18/12 0530  LABPROT 14.4 15.4* 15.5* 16.7*  INR 1.14 1.25 1.26 1.39   Cardiac Enzymes:   Recent Labs Lab 12/16/12 1035 12/16/12 1600 12/16/12 2117  TROPONINI <0.30 <0.30 <0.30   Urinalysis: No results found for  this basename: COLORURINE, APPERANCEUR, LABSPEC, PHURINE, GLUCOSEU, HGBUR, BILIRUBINUR, KETONESUR, PROTEINUR, UROBILINOGEN, NITRITE, LEUKOCYTESUR,  in the last 168 hours Lipid Panel    Component Value Date/Time   CHOL 223* 12/16/2012 0535   TRIG 43 12/16/2012 0535   HDL 90 12/16/2012 0535   CHOLHDL 2.5 12/16/2012 0535   VLDL 9 12/16/2012 0535   LDLCALC 124* 12/16/2012 0535   HgbA1C  Lab Results  Component Value Date   HGBA1C 6.0* 12/16/2012    Urine Drug Screen:     Component Value Date/Time   LABOPIA NONE DETECTED 11/24/2012 1950   LABOPIA NEGATIVE 04/01/2009 1007   COCAINSCRNUR NONE DETECTED 11/24/2012 1950   COCAINSCRNUR  Value: POSITIVE (NOTE) Result repeated and verified. Sent for confirmatory testing* 04/01/2009 1007   LABBENZ NONE DETECTED 11/24/2012 1950   LABBENZ NEGATIVE 04/01/2009 1007   AMPHETMU NONE DETECTED 11/24/2012 1950   AMPHETMU NEGATIVE 04/01/2009 1007   THCU NONE DETECTED 11/24/2012 1950   LABBARB NONE DETECTED 11/24/2012 1950    Alcohol Level:   Recent Labs Lab 12/15/12 1822  ETH <11   CT of  the brain  12/15/2012   Multiple areas of subacute to chronic infarction, all of which were present on the prior study. No evidence of hemorrhage or extra-axial fluid  MRI of the brain  12/16/2012   1. Diffusion-weighted imaging demonstrates no evidence for acute infarction. 2. Previously seen acute infarcts no longer exhibit restricted diffusion.     CXR  12/15/2012   No interval change or active cardiopulmonary disease.  EKG  Marked sinus bradycardia. Left bundle branch block. No significant change since last tracing 12/15/12.   Therapy Recommendations SNF  Physical Exam  Gen: NAD  CV: RRR  Mental Status:  Alert. Speech fluent but dysarthric. Able to follow 3 step commands without difficulty.  Cranial Nerves:  II: Visual fields grossly normal, pupils equal, round, reactive to light  III,IV, VI: ptosis not present, extra-ocular motions intact bilaterally  V,VII: decrease in right NLF, facial light touch sensation normal bilaterally  VIII: hearing normal bilaterally  IX,X: gag reflex present  XI: bilateral shoulder shrug  XII: midline tongue extension  Motor:  Right : Upper extremity 5/5 Left: Upper extremity 5-/5  Lower extremity 5/5 Lower extremity 4/5  Tone and bulk:normal tone throughout; no atrophy noted  Sensory: Llight touch intact throughout, bilaterally  Deep Tendon Reflexes: Symmetric throughout  Plantars:  Right: upgoing Left: upgoing  Cerebellar:  normal finger-to-nose. Heel-to-shin testing unable to be performed on the left  Gait: unable to test   ASSESSMENT [name] is a 67 y.o. female presenting with dysarthria and worsening right facial droop with ataxia. Imaging confirms no new acute infarct. 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 She was on warfarin prior to admission, subtherapeutic on arrival, INR 1.14. Now on warfarin with heparin bridge for secondary stroke prevention.  Patient with resultant severe dysarthria.   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 Hx right brain stroke May 2012 with deficits resolved. Hypertension Chronic systolic heart failure EF 10-15% per recent echo Hyperlipidemia, LDL 124, on statin PTA, now on statin, goal LDL < 100 (< 70 for diabetics) CAD - MI 2012  Baseline dementia  Hx seizures  Hospital day # 3  TREATMENT/PLAN  Continue warfarin and heparin for secondary stroke prevention until INR therapeutic. Ok to bridge with lovenox if  pt can be discharged as she did not have a stroke this admission.  No further stroke work up  Follow up Dr. Lance Muss in 2 months.  Annie Main, MSN, RN, ANVP-BC, ANP-BC, [facility] Pager: [phone] 12/18/2012 8:33 AM  I have personally obtained a history, examined the patient, evaluated imaging results, and formulated the assessment and plan of care. I agree with the above.  Delia Heady, MD

## 2012-12-18 NOTE — Progress Notes (Signed)
Clinical Social Work Department BRIEF PSYCHOSOCIAL ASSESSMENT 12/18/2012  Patient:  [name], [name]     Account Number:  [id number]     Admit date:  12/15/2012  Clinical Social Worker:  Harless Nakayama  Date/Time:  12/18/2012 10:15 AM  Referred by:  Physician  Date Referred:  12/18/2012 Referred for  SNF Placement   Other Referral:   Interview type:  Family Other interview type:   Spoke with pt niece on the phone    PSYCHOSOCIAL DATA Living Status:  FAMILY Admitted from facility:   Level of care:   Primary support name:  Astrid Drafts [phone] Primary support relationship to patient:  FAMILY Degree of support available:   Pt lives with her niece and has two sisters who are supportive.    CURRENT CONCERNS Current Concerns  Post-Acute Placement   Other Concerns:    SOCIAL WORK ASSESSMENT / PLAN CSW informed that PT is recommending SNF for ST rehab for pt. CSW called and spoke with pt niece about the recommendation. Pt niece quickly stated that she was available or had someone available that was always with the pt at home. CSW explained that was good but at this time it looks at thought pt may require more a good amount of rehab which can only be offered at a SNF. CSW explained the differences between [facility] and SNF. Pt niece informed CSW that pt was independent prior to coming into the hospital so she did not understand why she would need rehab. CSW explained that at this time pt is requiring assitance and pt niece would need to speak with a nurse or MD to inquire about medical reasoning for pt decline. CSW explained SNF referall process to pt niece and that it is beneficial to start the search early to allow facilities time to make bed offers. CSW also explained that allowing a referral to be made did not mean family would have to commit to a facility, they would still be able to change their mind and take pt home. Pt niece infomred CSW she was uncomfortable making the decision  without speaking to her mother. CSW asked if CSW could call pt sister and speak with her about this, pt niece requested that she be allowed a chance to update her mother first. CSW asked for pt niece to please call back as soon as family members have decided on a discharge plan.   Assessment/plan status:  Psychosocial Support/Ongoing Assessment of Needs Other assessment/ plan:   Information/referral to community resources:   SNF list denied    PATIENT'S/FAMILY'S RESPONSE TO PLAN OF CARE: Pt family is currently unagreeable to SNF search being started.       Trana Ressler, LCSWA [phone]

## 2012-12-18 NOTE — Progress Notes (Signed)
Speech Language Pathology Treatment: Dysphagia  Patient Details Name: [name] MRN: [id number] DOB: September 18, 1945 Today's Date: 12/18/2012 Time: 1191-4782 SLP Time Calculation (min): 12 min  Assessment / Plan / Recommendation Clinical Impression  Treatment session focused on addressing dysphagia goals.  SLP facilitated session with skilled observation of lunch, consisting of Dys.1 textures and nectar-thick liquids via cup.  Patient required Mod faded to Min verbal.visual cues to problem solve self-feeding.  Swallow was characterized by oral residue, difficulty transiting the bolus, multiple swallows and intermittent wet vocal quality.  Patient required Min vebral cues to utilize recommended safe swallow strategies to minimize aspiration risk; as a result, it is recommended to continue with current diet orders, use of precautions and full supervision.    HPI HPI: [name] is a 67 y.o. female who was apparently eating a english muffin earlier when she became suddenly dysarthric. It is unclear if she had any other changes earlier. She has had multiple strokes in the past and volunteers that she is not taking any medications.  PMH significiant for seizures, dementia, substance abuse, HTN COPD, and non-ischemic cardiomyopathy.    Pertinent Vitals none  SLP Plan  Continue with current plan of care    Recommendations Diet recommendations: Dysphagia 1 (puree);Nectar-thick liquid Liquids provided via: Cup;No straw Medication Administration: Crushed with puree Supervision: Patient able to self feed;Full supervision/cueing for compensatory strategies Compensations: Slow rate;Small sips/bites;Check for pocketing Postural Changes and/or Swallow Maneuvers: Seated upright 90 degrees;Upright 30-60 min after meal              Oral Care Recommendations: Oral care Q4 per protocol;Oral care BID Follow up Recommendations: Skilled Nursing facility;24 hour supervision/assistance Plan: Continue  with current plan of care    GO     [name]., CCC-SLP [phone]  [name] 12/18/2012, 2:22 PM

## 2012-12-18 NOTE — Progress Notes (Signed)
Subjective:  Patient denies any chest pain or shortness of breath up in chair. Denies any dizziness  Objective:  Vital Signs in the last 24 hours: Temp:  [97.4 F (36.3 C)-98.3 F (36.8 C)] 97.9 F (36.6 C) (10/28 0900) Pulse Rate:  [45-55] 50 (10/28 0900) Resp:  [16-18] 16 (10/28 0900) BP: (106-153)/(70-84) 106/70 mmHg (10/28 0900) SpO2:  [96 %-100 %] 100 % (10/28 0923)  Intake/Output from previous day: 10/27 0701 - 10/28 0700 In: 1127.1 [P.O.:340; I.V.:787.1] Out: 1100 [Urine:1100] Intake/Output from this shift:    Physical Exam: Neck: no adenopathy, no carotid bruit, no JVD and supple, symmetrical, trachea midline Lungs: clear to auscultation bilaterally Heart: regular rate and rhythm, S1, S2 normal and Soft systolic murmur noted no S3 gallop Abdomen: soft, non-tender; bowel sounds normal; no masses,  no organomegaly Extremities: extremities normal, atraumatic, no cyanosis or edema  Lab Results:  Recent Labs  12/17/12 0524 12/18/12 0530  WBC 4.1 3.1*  HGB 13.1 11.4*  PLT 202 187    Recent Labs  12/17/12 0524 12/18/12 0530  NA 140 140  K 3.8 4.0  CL 104 107  CO2 28 23  GLUCOSE 79 77  BUN 13 8  CREATININE 0.99 0.93    Recent Labs  12/16/12 1600 12/16/12 2117  TROPONINI <0.30 <0.30   Hepatic Function Panel  Recent Labs  12/15/12 1822  PROT 7.9  ALBUMIN 4.0  AST 24  ALT 11  ALKPHOS 84  BILITOT 0.8    Recent Labs  12/16/12 0535  CHOL 223*   No results found for this basename: PROTIME,  in the last 72 hours  Imaging: Imaging results have been reviewed and Mr Brain Ltd W/o Cm  12/16/2012   CLINICAL DATA:  Left-sided weakness. Multiple remote infarcts.  EXAM: MRI HEAD WITHOUT CONTRAST  TECHNIQUE: Multiplanar, multisequence MR imaging was performed. No intravenous contrast was administered.  COMPARISON:  CT head without contrast 11/25/2012. MRI brain without contrast 11/24/2012.  FINDINGS: Only a diffusion sequence was performed. The patient  refused additional imaging. The notes state that the patient is a candidate for conscious sedation if further imaging is required.  The diffusion sequence demonstrates no evidence for acute or subacute infarction. The previously seen acute infarcts no longer exhibit restricted diffusion. Multiple areas of remote infarction are again noted.  IMPRESSION: 1. Diffusion-weighted imaging demonstrates no evidence for acute infarction. 2. Previously seen acute infarcts no longer exhibit restricted diffusion.   Electronically Signed   By: Gennette Pac M.D.   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

## 2012-12-18 NOTE — Progress Notes (Addendum)
Physical Therapy Treatment Patient Details Name: [name] MRN: [id number] DOB: 1945/08/24 Today's Date: 12/18/2012 Time: 0935-1000 PT Time Calculation (min): 25 min  PT Assessment / Plan / Recommendation  History of Present Illness Patient is a 67 yo female admitted with slurred speech and difficulty ambulating.  Patient with recent h/o CVA, with treatment on Inpatient Rehab center.  Was discharged home 12/12/12..  Patient with h/o CVA, dementia, COPD, CAD, schizophrenia.   PT Comments   Pt making slow gains, she is fearful of falling and this limits her ability to ambulate. Today pt was able to transfer to chair with mod assist. Demonstrates Rt. And posterior lean, often leaves Lt. Foot off the floor. Decreased attention throughout treatment. PT questioned pt about another CIR stay, pt reports she would like to "go somewhere new."  Follow Up Recommendations  SNF;Supervision/Assistance - 24 hour           Equipment Recommendations  None recommended by PT       Frequency Min 2X/week   Progress towards PT Goals Progress towards PT goals: Progressing toward goals  Plan Current plan remains appropriate    Precautions / Restrictions Precautions Precautions: Fall Precaution Comments: severe dysarthria   Pertinent Vitals/Pain Bil. Foot pain when placing them on floor, resolved by donning shoes.     Mobility  Bed Mobility Bed Mobility: Rolling Right;Right Sidelying to Sit;Sitting - Scoot to Edge of Bed Rolling Right: 4: Min guard;With rail Right Sidelying to Sit: 4: Min guard;With rails;HOB elevated Sitting - Scoot to Edge of Bed: 4: Min guard;With rail Details for Bed Mobility Assistance: Verbal and tactile cues for technique.  Increased time for mobility.   Poor attention throughout.  Transfers Transfers: Sit to Stand;Stand to Sit;Stand Pivot Transfers Sit to Stand: With upper extremity assist;From bed;3: Mod assist (x 4 reps) Stand to Sit: 3: Mod assist;To  chair/3-in-1;To bed Stand Pivot Transfers: 3: Mod assist Details for Transfer Assistance: Cues for safety, hand placement. Pt reaching and grabbing for environmental support. Barely places Lt. foot on floor, Rt. and posterior lean. Ambulation/Gait Ambulation/Gait Assistance:  (attempted, pt unable. keeps Lt foot off floor and pt fearful of falling) Modified Rankin (Stroke Patients Only) Pre-Morbid Rankin Score: Moderate disability Modified Rankin: Moderately severe disability      PT Goals (current goals can now be found in the care plan section) Acute Rehab PT Goals Patient Stated Goal: go home  Visit Information  Last PT Received On: 12/18/12 Assistance Needed: +1 History of Present Illness: Patient is a 67 yo female admitted with slurred speech and difficulty ambulating.  Patient with recent h/o CVA, with treatment on Inpatient Rehab center.  Was discharged home 12/12/12..  Patient with h/o CVA, dementia, COPD, CAD, schizophrenia.    Subjective Data  Patient Stated Goal: go home   Cognition  Cognition Arousal/Alertness: Awake/alert Behavior During Therapy: Restless Overall Cognitive Status: No family/caregiver present to determine baseline cognitive functioning Area of Impairment: Orientation;Attention;Memory;Following commands;Safety/judgement;Problem solving Orientation Level: Disoriented to;Time;Situation Current Attention Level: Sustained Memory: Decreased short-term memory Following Commands: Follows one step commands with increased time Safety/Judgement: Decreased awareness of safety;Decreased awareness of deficits Problem Solving: Slow processing;Difficulty sequencing;Requires verbal cues    Balance  Balance Balance Assessed: Yes Static Sitting Balance Static Sitting - Balance Support: Right upper extremity supported;Feet supported Static Sitting - Level of Assistance: 5: Stand by assistance Static Standing Balance Static Standing - Balance Support: Bilateral  upper extremity supported;During functional activity Static Standing - Level of Assistance: 4: Min assist  Static Standing - Comment/# of Minutes: Pt leans Rt., Lt. foot does not touch floor most of time.   End of Session PT - End of Session Activity Tolerance: Patient limited by fatigue (limited by fear of falling) Patient left: with call bell/phone within reach;in chair;with chair alarm set (sitting EOB with nursing) Nurse Communication: Mobility status (chair alarm)   GP     Wilhemina Bonito 12/18/2012, 11:45 AM

## 2012-12-18 NOTE — Progress Notes (Signed)
CSW (Clinical Child psychotherapist) received call back from pt niece who informed CSW it was okay to call her mother [name].  CSW called [name] who is pt's sister and was able to briefly explain SNF recommendation. Ms [name] was unable to speak at the time but has asked to speak with CSW tomorrow when she is at the hospital. [name] did give CSW permission to refer pt to facilities in [facility] so bed offers could be discussed tomorrow.  Tenille Morrill, LCSWA [phone]

## 2012-12-18 NOTE — Progress Notes (Signed)
ANTICOAGULATION CONSULT NOTE - Follow Up Consult  Pharmacy Consult for heparin Indication: CVA and apical thrombus  Labs:  Recent Labs  12/15/12 1822 12/15/12 1837 12/16/12 0535  12/16/12 0900 12/16/12 1035 12/16/12 1600 12/16/12 2117 12/17/12 0524 12/17/12 0925 12/17/12 1920 12/18/12 0530  HGB 14.3 14.6  --   --  12.4  --   --   --  13.1  --   --  11.4*  HCT 42.5 43.0  --   --  37.3  --   --   --  39.7  --   --  33.7*  PLT 204  --   --   --  210  --   --   --  202  --   --  187  APTT 34  --   --   --   --   --   --   --   --   --   --   --   LABPROT 14.4  --  15.4*  --   --   --   --   --  15.5*  --   --  16.7*  INR 1.14  --  1.25  --   --   --   --   --  1.26  --   --  1.39  HEPARINUNFRC  --   --   --   < > 0.29*  --   --  1.02*  --  1.14* 0.58 0.38  CREATININE 1.24* 1.50*  --   --   --   --   --   --  0.99  --   --   --   TROPONINI <0.30  --   --   --   --  <0.30 <0.30 <0.30  --   --   --   --   < > = values in this interval not displayed.   Assessment/Plan:  67yo female now therapeutic on heparin after multiple rate changes.  Will continue gtt at current rate and confirm stable with additional level.  Vernard Gambles, PharmD, BCPS  12/18/2012,6:41 AM

## 2012-12-19 DIAGNOSIS — I6350 Cerebral infarction due to unspecified occlusion or stenosis of unspecified cerebral artery: Secondary | ICD-10-CM

## 2012-12-19 LAB — BASIC METABOLIC PANEL
CO2: 23 mEq/L (ref 19–32)
Calcium: 8.8 mg/dL (ref 8.4–10.5)
GFR calc non Af Amer: 64 mL/min — ABNORMAL LOW (ref >90)
Sodium: 138 mEq/L (ref 135–145)

## 2012-12-19 LAB — URINE CULTURE

## 2012-12-19 LAB — MAGNESIUM: Magnesium: 1.6 mg/dL (ref 1.5–2.5)

## 2012-12-19 LAB — PROTIME-INR: INR: 1.45 (ref 0.00–1.49)

## 2012-12-19 MED ORDER — WARFARIN SODIUM 4 MG PO TABS
4.0000 mg | ORAL_TABLET | Freq: Once | ORAL | Status: AC
  Administered 2012-12-19: 4 mg via ORAL
  Filled 2012-12-19: qty 1

## 2012-12-19 MED ORDER — MAGNESIUM OXIDE 400 (241.3 MG) MG PO TABS
200.0000 mg | ORAL_TABLET | Freq: Two times a day (BID) | ORAL | Status: DC
  Administered 2012-12-19 – 2012-12-20 (×3): 200 mg via ORAL
  Filled 2012-12-19 (×5): qty 0.5

## 2012-12-19 NOTE — Progress Notes (Signed)
ANTICOAGULATION CONSULT NOTE - Follow Up Consult  Pharmacy Consult for Coumadin and Lovenox Indication: CVA and apical thrombus  Allergies  Allergen Reactions  . Codeine Itching    All over the body  . Penicillins Hives and Other (See Comments)    "Whelps" per patient.  . Hydralazine     02/28/11 Family unsure if this is allergy for pt    Patient Measurements: Height: 5\' 1"  (154.9 cm) Weight: 82 lb 4.8 oz (37.331 kg) IBW/kg (Calculated) : 47.8 Heparin Dosing Weight: 51kg  Vital Signs: Temp: 97.7 F (36.5 C) (10/29 0431) BP: 145/92 mmHg (10/29 0431) Pulse Rate: 65 (10/29 0431)  Labs:  Recent Labs  12/16/12 1035 12/16/12 1600  12/16/12 2117 12/17/12 0524 12/17/12 0925 12/17/12 1920 12/18/12 0530 12/19/12 0512  HGB  --   --   --   --  13.1  --   --  11.4*  --   HCT  --   --   --   --  39.7  --   --  33.7*  --   PLT  --   --   --   --  202  --   --  187  --   LABPROT  --   --   --   --  15.5*  --   --  16.7* 17.3*  INR  --   --   --   --  1.26  --   --  1.39 1.45  HEPARINUNFRC  --   --   < > 1.02*  --  1.14* 0.58 0.38  --   CREATININE  --   --   --   --  0.99  --   --  0.93 0.91  TROPONINI <0.30 <0.30  --  <0.30  --   --   --   --   --   < > = values in this interval not displayed.  Estimated Creatinine Clearance: 35.3 ml/min (by C-G formula based on Cr of 0.91).  Assessment: 67yof with recent L cerebellar and R posterior ischemic embolic infarcts 10/4 due to apical thrombus and started on coumadin. INR on 10/22 was 2.09 but down to 1.14 on 10/25? Now on lovenox bridge (ok with stroke team as no new CVA on admit). INR 1.45 - moving very slowly on 3mg  daily. Hgb down to 11.4 - will watch. No bleeding noted. SCr remains stable.  Goal of Therapy:  Anti-Xa level 0.3-0.6 drawn 4 hours after LMWH given Monitor platelets by anticoagulation protocol: Yes   Plan:  1) Continue lovenox 35 mg SQ q12h. 2) Coumadin 4mg  x 1 3) INR in AM, CBC q72h  Christoper Fabian, PharmD,  BCPS Clinical pharmacist, pager [phone] 12/19/2012,10:25 AM

## 2012-12-19 NOTE — Progress Notes (Signed)
[facility] PROGRESS NOTE  [name] XBJ:[id number] DOB: [birth year] DOA: 12/15/2012 PCP: Robynn Pane, MD  Assessment/Plan: 1. Left-sided weakness and dysarthria probably  due to another stroke/TIA. -History of apical Thrombus. She was recently admitted for stroke and discharged on Coumadin, INR was sub-therapeutic on admission.  - Seen by neuro, case discussed with them they want to continue her on Coumadin rather than a newer agent. MRI noted.  -Placed on statin. - Dys 1 diet with Full aspiration precautions. -PT recommend SNF.  -Continue with Lovenox and coumadin bridge.  -INR at 1.4.   2. Chronic systolic heart failure EF 10-15% per recent echo history of apical thrombus. Compensated.  -Continue with coreg.  low-dose ACE inhibitor as tolerated by blood pressure.   3. Hyperlipidemia. Placed Lipitor.  4. History of COPD. Stable, nebulizer oxygen as needed.  5. History of CAD. Chest pain-free no acute issues.  6. Hypotension. Resolved  with Gentle IV fluids, parameters written to hold blood pressure medications. 7-Replete Mg oral.    Code Status: Full Code.  Family Communication: none at bedside.  Disposition Plan: SNF patient is agreeable.    Consultants: neurology, cardiology   Procedures: MRI MRA brain   Antibiotics:  none  HPI/Subjective: Awake, oriented to place, person.  No complaints. She agree to go to SNF.   Objective: Filed Vitals:   12/19/12 0431  BP: 145/92  Pulse: 65  Temp: 97.7 F (36.5 C)  Resp: 20    Intake/Output Summary (Last 24 hours) at 12/19/12 1307 Last data filed at 12/19/12 1019  Gross per 24 hour  Intake  707.5 ml  Output    850 ml  Net -142.5 ml   Filed Weights   12/16/12 1200 12/18/12 1100  Weight: 51.2 kg (112 lb 14 oz) 37.331 kg (82 lb 4.8 oz)    Exam:   General:  No distress.   Cardiovascular: S 1, S 2 RRR  Respiratory: CTA  Abdomen: BS present, soft, NT  Musculoskeletal: no edema.   Data  Reviewed: Basic Metabolic Panel:  Recent Labs Lab 12/15/12 1822 12/15/12 1837 12/16/12 1035 12/17/12 0524 12/18/12 0530 12/19/12 0512  NA 144 144  --  140 140 138  K 3.8 3.9  --  3.8 4.0 4.1  CL 104 106  --  104 107 107  CO2 26  --   --  28 23 23   GLUCOSE 85 88  --  79 77 74  BUN 19 19  --  13 8 8   CREATININE 1.24* 1.50*  --  0.99 0.93 0.91  CALCIUM 10.0  --   --  9.3 8.6 8.8  MG  --   --   --  2.2 1.7 1.6  PHOS  --   --  3.8  --   --   --    Liver Function Tests:  Recent Labs Lab 12/15/12 1822  AST 24  ALT 11  ALKPHOS 84  BILITOT 0.8  PROT 7.9  ALBUMIN 4.0   No results found for this basename: LIPASE, AMYLASE,  in the last 168 hours No results found for this basename: AMMONIA,  in the last 168 hours CBC:  Recent Labs Lab 12/15/12 1822 12/15/12 1837 12/16/12 0900 12/17/12 0524 12/18/12 0530  WBC 5.7  --  4.2 4.1 3.1*  NEUTROABS 4.2  --   --   --   --   HGB 14.3 14.6 12.4 13.1 11.4*  HCT 42.5 43.0 37.3 39.7 33.7*  MCV 92.4  --  92.3 92.8 91.6  PLT 204  --  210 202 187   Cardiac Enzymes:  Recent Labs Lab 12/15/12 1822 12/16/12 1035 12/16/12 1600 12/16/12 2117  TROPONINI <0.30 <0.30 <0.30 <0.30   BNP (last 3 results) No results found for this basename: PROBNP,  in the last 8760 hours CBG:  Recent Labs Lab 12/15/12 1838  GLUCAP 101*    No results found for this or any previous visit (from the past 240 hour(s)).   Studies: No results found.  Scheduled Meds: . atorvastatin  10 mg Oral q1800  . budesonide-formoterol  2 puff Inhalation BID  . carvedilol  3.125 mg Oral BID WC  . citalopram  10 mg Oral Daily  . enoxaparin (LOVENOX) injection  35 mg Subcutaneous Q12H  . lisinopril  2.5 mg Oral Daily  . multivitamin with minerals  1 tablet Oral Daily  . QUEtiapine  25 mg Oral QHS  . sodium chloride  3 mL Intravenous Q12H  . warfarin  4 mg Oral ONCE-1800  . Warfarin - Pharmacist Dosing Inpatient   Does not apply q1800   Continuous  Infusions:   Principal Problem:   TIA (transient ischemic attack) Active Problems:   HYPERLIPIDEMIA   HYPERTENSION, BENIGN ESSENTIAL   CAD   CHF   COPD   Embolic cerebral infarction   Late effects of cerebrovascular accident    Time spent: 25 minutes.     Casia Corti  [facility] Pager [phone]. If 7PM-7AM, please contact night-coverage at [email or web address], password [facility] 12/19/2012, 1:07 PM  LOS: 4 days

## 2012-12-19 NOTE — Progress Notes (Signed)
Subjective:  Patient denies any chest pain shortness of breath or palpitations.  INR still subtherapeutic  Objective:  Vital Signs in the last 24 hours: Temp:  [97.5 F (36.4 C)-97.8 F (36.6 C)] 97.7 F (36.5 C) (10/29 0431) Pulse Rate:  [50-69] 65 (10/29 0431) Resp:  [15-20] 20 (10/29 0431) BP: (116-145)/(76-99) 145/92 mmHg (10/29 0431) SpO2:  [92 %-100 %] 94 % (10/29 0845)  Intake/Output from previous day: 10/28 0701 - 10/29 0700 In: 1171.5 [P.O.:480; I.V.:691.5] Out: 550 [Urine:550] Intake/Output from this shift: Total I/O In: 120 [P.O.:120] Out: 300 [Urine:300]  Physical Exam: Neck: no adenopathy, no carotid bruit, no JVD and supple, symmetrical, trachea midline Lungs: clear to auscultation bilaterally Heart: regular rate and rhythm, S1, S2 normal and soft systolic murmur noted Abdomen: soft, non-tender; bowel sounds normal; no masses,  no organomegaly Extremities: extremities normal, atraumatic, no cyanosis or edema  Lab Results:  Recent Labs  12/17/12 0524 12/18/12 0530  WBC 4.1 3.1*  HGB 13.1 11.4*  PLT 202 187    Recent Labs  12/18/12 0530 12/19/12 0512  NA 140 138  K 4.0 4.1  CL 107 107  CO2 23 23  GLUCOSE 77 74  BUN 8 8  CREATININE 0.93 0.91    Recent Labs  12/16/12 1600 12/16/12 2117  TROPONINI <0.30 <0.30   Hepatic Function Panel No results found for this basename: PROT, ALBUMIN, AST, ALT, ALKPHOS, BILITOT, BILIDIR, IBILI,  in the last 72 hours No results found for this basename: CHOL,  in the last 72 hours No results found for this basename: PROTIME,  in the last 72 hours  Imaging: Imaging results have been reviewed and No results found.  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

## 2012-12-19 NOTE — Progress Notes (Signed)
Occupational Therapy Treatment Patient Details Name: [name] MRN: [id number] DOB: 04/17/1945 Today's Date: 12/19/2012 Time: 1191-4782 OT Time Calculation (min): 19 min  OT Assessment / Plan / Recommendation  History of present illness Patient is a 67 yo female admitted with slurred speech and difficulty ambulating.  Patient with recent h/o CVA, with treatment on Inpatient Rehab center.  Was discharged home 12/12/12..  Patient with h/o CVA, dementia, COPD, CAD, schizophrenia.   OT comments  Pt required increased assist today for bed mobility & transfers, not bearing weight through LLE secondary to c/o toe pain. RN made aware and addressing. Cognitive status impacts session, Cont toward acute OT Goals.    Follow Up Recommendations  SNF    Barriers to Discharge       Equipment Recommendations  None recommended by OT    Recommendations for Other Services    Frequency Min 2X/week   Progress towards OT Goals Progress towards OT goals: Progressing toward goals  Plan Discharge plan remains appropriate    Precautions / Restrictions Precautions Precautions: Fall Precaution Comments: dysarthria Restrictions Weight Bearing Restrictions: No   Pertinent Vitals/Pain Left toe/foot pain, not rated. RN made aware, repositioned, rest.    ADL  Eating/Feeding: Performed;Set up Where Assessed - Eating/Feeding: Chair Grooming: Performed;Wash/dry hands;Set up;Minimal assistance Where Assessed - Grooming: Supported sitting Toilet Transfer: Simulated;Moderate assistance (Transfer bed to recliner) Toilet Transfer Method: Stand pivot Transfers/Ambulation Related to ADLs: Min assist bed mobility and sit to stand. Pt declined toileting however agreeable to SPT to recliner to eat. Pt noted to not bear weight on LLE (hopping), she reports toe pain, RN made aware. Overall Mod assist SPT today w/ moderate vc's for safety. ADL Comments: Pt performing SPT from bed to chair in preparation for  increased performance in ADL's. Pt required increased assist today, not bearing weight through LLE secondary to c/o toe pain. RN made aware and addressing. Cognitive status impacts session, Cont toward acute OT Goals.    OT Diagnosis:    OT Problem List:   OT Treatment Interventions:     OT Goals(current goals can now be found in the care plan section) Acute Rehab OT Goals Patient Stated Goal: go home Time For Goal Achievement: 12/31/12 Potential to Achieve Goals: Good  Visit Information  Last OT Received On: 12/19/12 Assistance Needed: +1 History of Present Illness: Patient is a 67 yo female admitted with slurred speech and difficulty ambulating.  Patient with recent h/o CVA, with treatment on Inpatient Rehab center.  Was discharged home 12/12/12..  Patient with h/o CVA, dementia, COPD, CAD, schizophrenia.    Subjective Data      Prior Functioning       Cognition  Cognition Arousal/Alertness: Awake/alert Behavior During Therapy: Restless Overall Cognitive Status: No family/caregiver present to determine baseline cognitive functioning Area of Impairment: Orientation;Attention;Memory;Following commands;Safety/judgement;Problem solving Orientation Level: Disoriented to;Time;Situation Current Attention Level: Sustained Memory: Decreased short-term memory Following Commands: Follows one step commands with increased time Safety/Judgement: Decreased awareness of safety;Decreased awareness of deficits Problem Solving: Slow processing;Difficulty sequencing;Requires verbal cues;Decreased initiation    Mobility  Bed Mobility Bed Mobility: Rolling Right;Right Sidelying to Sit;Sitting - Scoot to Edge of Bed Rolling Right: 4: Min assist;With rail Supine to Sit: 4: Min assist;With rails;HOB flat Sitting - Scoot to [place] of Bed: 4: Min assist;With rail Details for Bed Mobility Assistance: Verbal and tactile cues for technique.  Increased time for mobility.   Poor attention throughout.   Transfers Transfers: Sit to Stand;Stand to Sit Sit to  Stand: With upper extremity assist;From bed;4: Min assist;3: Mod assist Stand to Sit: 3: Mod assist;To chair/3-in-1;To bed Details for Transfer Assistance: Cues for safety, hand placement. Pt reaching and grabbing for environmental support. Does not place Lt. foot on floor, Rt. and posterior lean.            End of Session OT - End of Session Equipment Utilized During Treatment: Gait belt Activity Tolerance: Patient tolerated treatment well Patient left: in chair;with call bell/phone within reach Nurse Communication: Mobility status;Other (comment) (Pt c/o L toe/foot pain)  GO     Charletta Cousin, Johnavon Mcclafferty Beth Dixon 12/19/2012, 11:10 AM

## 2012-12-19 NOTE — Progress Notes (Signed)
CSW (Clinical Child psychotherapist) called pt sister Ms. Maisie Fus to inquire as to when she will be at the hospital as she had requested to speak in person yesterday. Pt sister to call CSW when she is at hospital and is aware of CSW hours and acknowledged she will be here well before CSW leaves.  Pt has received a 30 day PASRR for SNF placement.  Haris Baack, LCSWA [phone]

## 2012-12-19 NOTE — Progress Notes (Signed)
CSW Proofreader) spoke with pt sisters and was informed that pt sister [name] is the POA. CSW gave bed offers. Pt has been at [facility] health care before and family would like for her to return. CSW has notified facility.  Leiloni Smithers, LCSWA [phone]

## 2012-12-19 NOTE — Progress Notes (Signed)
Stroke Team Progress Note  HISTORY [name] is an 67 y.o. female who was recently discharged from [facility] after an acute stroke for which she received tPA. Work up revealed a small apical thrombus and the patient was started on Coumadin. At discharge the patient had a right facial droop, dysarthria and left sided weakness. 12/15/2012 when coming out of the bathroom was noted to have worsening dysarthria, facial droop and ataxia. EMS was called at that time and the patient was brought in as a code stroke. Initial NIHSS of 5. INR sub-therapeutic upon admission. Patient was not a TPA candidate secondary to recent infarct and being on coumadin. She was admitted to 3W for further evaluation and treatment.  SUBJECTIVE Pt lying in bed. Understands we are awaiting a SNF bed.  OBJECTIVE Most recent Vital Signs: Filed Vitals:   12/18/12 2010 12/19/12 0000 12/19/12 0431 12/19/12 0845  BP:  124/76 145/92   Pulse:  61 65   Temp:   97.7 F (36.5 C)   TempSrc:      Resp:   20   Height:      Weight:      SpO2: 93% 99% 96% 94%   CBG (last 3)  No results found for this basename: GLUCAP,  in the last 72 hours  IV Fluid Intake:   . sodium chloride 250 mL (12/18/12 1220)    MEDICATIONS  . atorvastatin  10 mg Oral q1800  . budesonide-formoterol  2 puff Inhalation BID  . carvedilol  3.125 mg Oral BID WC  . citalopram  10 mg Oral Daily  . enoxaparin (LOVENOX) injection  35 mg Subcutaneous Q12H  . lisinopril  2.5 mg Oral Daily  . multivitamin with minerals  1 tablet Oral Daily  . QUEtiapine  25 mg Oral QHS  . sodium chloride  3 mL Intravenous Q12H  . Warfarin - Pharmacist Dosing Inpatient   Does not apply q1800   PRN:  albuterol, ALPRAZolam, metoprolol, nitroGLYCERIN, RESOURCE THICKENUP CLEAR, senna-docusate, sodium chloride  Diet:  Dysphagia 1 nectar thick liquids Activity:   DVT Prophylaxis:  lovenox and coumadin  CLINICALLY SIGNIFICANT STUDIES Basic Metabolic Panel:   Recent Labs Lab  12/16/12 1035  12/18/12 0530 12/19/12 0512  NA  --   < > 140 138  K  --   < > 4.0 4.1  CL  --   < > 107 107  CO2  --   < > 23 23  GLUCOSE  --   < > 77 74  BUN  --   < > 8 8  CREATININE  --   < > 0.93 0.91  CALCIUM  --   < > 8.6 8.8  MG  --   < > 1.7 1.6  PHOS 3.8  --   --   --   < > = values in this interval not displayed. Liver Function Tests:   Recent Labs Lab 12/15/12 1822  AST 24  ALT 11  ALKPHOS 84  BILITOT 0.8  PROT 7.9  ALBUMIN 4.0   CBC:  Recent Labs Lab 12/15/12 1822  12/17/12 0524 12/18/12 0530  WBC 5.7  < > 4.1 3.1*  NEUTROABS 4.2  --   --   --   HGB 14.3  < > 13.1 11.4*  HCT 42.5  < > 39.7 33.7*  MCV 92.4  < > 92.8 91.6  PLT 204  < > 202 187  < > = values in this interval not displayed. Coagulation:  Recent Labs Lab 12/16/12 0535 12/17/12 0524 12/18/12 0530 12/19/12 0512  LABPROT 15.4* 15.5* 16.7* 17.3*  INR 1.25 1.26 1.39 1.45   Cardiac Enzymes:   Recent Labs Lab 12/16/12 1035 12/16/12 1600 12/16/12 2117  TROPONINI <0.30 <0.30 <0.30   Urinalysis:   Recent Labs Lab 12/18/12 1047  COLORURINE YELLOW  LABSPEC 1.017  PHURINE 5.5  GLUCOSEU NEGATIVE  HGBUR NEGATIVE  BILIRUBINUR NEGATIVE  KETONESUR NEGATIVE  PROTEINUR NEGATIVE  UROBILINOGEN 0.2  NITRITE NEGATIVE  LEUKOCYTESUR SMALL*   Lipid Panel    Component Value Date/Time   CHOL 223* 12/16/2012 0535   TRIG 43 12/16/2012 0535   HDL 90 12/16/2012 0535   CHOLHDL 2.5 12/16/2012 0535   VLDL 9 12/16/2012 0535   LDLCALC 124* 12/16/2012 0535   HgbA1C  Lab Results  Component Value Date   HGBA1C 6.0* 12/16/2012    Urine Drug Screen:     Component Value Date/Time   LABOPIA NONE DETECTED 12/18/2012 1047   LABOPIA NEGATIVE 04/01/2009 1007   COCAINSCRNUR NONE DETECTED 12/18/2012 1047   COCAINSCRNUR  Value: POSITIVE (NOTE) Result repeated and verified. Sent for confirmatory testing* 04/01/2009 1007   LABBENZ NONE DETECTED 12/18/2012 1047   LABBENZ NEGATIVE 04/01/2009 1007    AMPHETMU NONE DETECTED 12/18/2012 1047   AMPHETMU NEGATIVE 04/01/2009 1007   THCU NONE DETECTED 12/18/2012 1047   LABBARB NONE DETECTED 12/18/2012 1047    Alcohol Level:   Recent Labs Lab 12/15/12 1822  ETH <11   CT of the brain  12/15/2012   Multiple areas of subacute to chronic infarction, all of which were present on the prior study. No evidence of hemorrhage or extra-axial fluid  MRI of the brain  12/16/2012   1. Diffusion-weighted imaging demonstrates no evidence for acute infarction. 2. Previously seen acute infarcts no longer exhibit restricted diffusion.     CXR  12/15/2012   No interval change or active cardiopulmonary disease.  EKG  Marked sinus bradycardia. Left bundle branch block. No significant change since last tracing 12/15/12.   Therapy Recommendations SNF  Physical Exam  Gen: NAD  CV: RRR  Mental Status:  Alert. Speech fluent but dysarthric. Able to follow 3 step commands without difficulty.  Cranial Nerves:  II: Visual fields grossly normal, pupils equal, round, reactive to light  III,IV, VI: ptosis not present, extra-ocular motions intact bilaterally  V,VII: decrease in right NLF, facial light touch sensation normal bilaterally  VIII: hearing normal bilaterally  IX,X: gag reflex present  XI: bilateral shoulder shrug  XII: midline tongue extension  Motor:  Right : Upper extremity 5/5 Left: Upper extremity 5-/5  Lower extremity 5/5 Lower extremity 4/5  Tone and bulk:normal tone throughout; no atrophy noted  Sensory: Llight touch intact throughout, bilaterally  Deep Tendon Reflexes: Symmetric throughout  Plantars:  Right: upgoing Left: upgoing  Cerebellar:  normal finger-to-nose. Heel-to-shin testing unable to be performed on the left  Gait: unable to test   ASSESSMENT [name] is a 67 y.o. female presenting with dysarthria and worsening right facial droop with ataxia. Imaging confirms no new acute infarct. 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 She was on warfarin prior to admission, subtherapeutic on arrival, INR 1.14. Now on warfarin, changed to lovenox bridge for secondary stroke prevention. Patient with resultant severe dysarthria. Awaiting SNF placement.  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  Hx right brain stroke May 2012 with deficits resolved. Hypertension Chronic systolic heart failure EF 10-15% per recent echo Hyperlipidemia, LDL 124, on statin PTA, now on statin, goal LDL < 100 (< 70 for diabetics) CAD - MI 2012  Baseline mild dementia  Hx seizures  Hospital day # 4  TREATMENT/PLAN  Continue heparin and lovenox for secondary stroke prevention until INR therapeutic.  No further stroke workup indicated. Patient has a 10-15% risk of having another stroke over the next year, the highest risk is within 2 weeks of the most recent stroke/TIA (risk of having a stroke following a stroke or TIA is the same). Ongoing risk factor control by Primary Care Physician Stroke Service will sign off. Please call should any needs arise. Follow up with Dr. Pearlean Brownie, Stroke Clinic, in 2 months.   Annie Main, MSN, RN, ANVP-BC, ANP-BC, [facility] Pager: [phone] 12/19/2012 10:07 AM  I have personally obtained a history, examined the patient, evaluated imaging results, and formulated the assessment and plan of care. I agree with the above. Delia Heady, MD

## 2012-12-20 LAB — MAGNESIUM: Magnesium: 1.7 mg/dL (ref 1.5–2.5)

## 2012-12-20 LAB — PROTIME-INR
INR: 1.52 — ABNORMAL HIGH (ref 0.00–1.49)
Prothrombin Time: 17.9 seconds — ABNORMAL HIGH (ref 11.6–15.2)

## 2012-12-20 MED ORDER — WARFARIN SODIUM 5 MG PO TABS
5.0000 mg | ORAL_TABLET | Freq: Once | ORAL | Status: DC
  Filled 2012-12-20: qty 1

## 2012-12-20 MED ORDER — ATORVASTATIN CALCIUM 10 MG PO TABS: 10.0000 mg | ORAL_TABLET | Freq: Every day | ORAL | Status: DC

## 2012-12-20 MED ORDER — ENOXAPARIN SODIUM 40 MG/0.4ML ~~LOC~~ SOLN: 35.0000 mg | Freq: Two times a day (BID) | SUBCUTANEOUS | Status: DC

## 2012-12-20 MED ORDER — WARFARIN SODIUM 5 MG PO TABS: 5.0000 mg | ORAL_TABLET | Freq: Every day | ORAL | Status: DC

## 2012-12-20 MED ORDER — ALPRAZOLAM 0.25 MG PO TABS: 0.2500 mg | ORAL_TABLET | ORAL | Status: DC | PRN

## 2012-12-20 NOTE — Progress Notes (Signed)
ANTICOAGULATION CONSULT NOTE - Follow Up Consult  Pharmacy Consult for Coumadin and Lovenox Indication: CVA and apical thrombus  Allergies  Allergen Reactions  . Codeine Itching    All over the body  . Penicillins Hives and Other (See Comments)    "Whelps" per patient.  . Hydralazine     02/28/11 Family unsure if this is allergy for pt    Patient Measurements: Height: 5\' 1"  (154.9 cm) Weight: 82 lb 4.8 oz (37.331 kg) IBW/kg (Calculated) : 47.8  Vital Signs: Temp: 97.6 F (36.4 C) (10/30 0700) Temp src: Oral (10/30 0700) BP: 134/100 mmHg (10/30 0700) Pulse Rate: 54 (10/30 0700)  Labs:  Recent Labs  12/17/12 0925 12/17/12 1920 12/18/12 0530 12/19/12 0512 12/20/12 0530  HGB  --   --  11.4*  --   --   HCT  --   --  33.7*  --   --   PLT  --   --  187  --   --   LABPROT  --   --  16.7* 17.3* 17.9*  INR  --   --  1.39 1.45 1.52*  HEPARINUNFRC 1.14* 0.58 0.38  --   --   CREATININE  --   --  0.93 0.91  --     Estimated Creatinine Clearance: 35.3 ml/min (by C-G formula based on Cr of 0.91).  Assessment: 67yof with recent L cerebellar and R posterior ischemic embolic infarcts 10/4 due to apical thrombus and started on coumadin. INR on 10/22 was 2.09 but down to 1.14 on 10/25? Now on lovenox bridge (ok with stroke team as no new CVA on admit). INR 1.52 - moving very slowly even with dose increase. (At previous admit - required between 2-3mg  coumadin with therapeutic INR). 10/29 Hgb down to 11.4 - will watch. No bleeding noted. SCr remains stable.  Goal of Therapy:  Anti-Xa level 0.3-0.6 drawn 4 hours after LMWH given Monitor platelets by anticoagulation protocol: Yes   Plan:  1) Continue lovenox 35 mg SQ q12h. 2) Coumadin 5mg  x 1 3) INR in AM, CBC q72h  Christoper Fabian, PharmD, BCPS Clinical pharmacist, pager [phone] 12/20/2012,8:38 AM

## 2012-12-20 NOTE — Discharge Summary (Signed)
Physician Discharge Summary  [name] ZOX:[id number] DOB: 09-12-1945 DOA: 12/15/2012  PCP: Bailey Pane, MD  Admit date: 12/15/2012 Discharge date: 12/20/2012  Time spent: 35 minutes  Recommendations for Outpatient Follow-up:  1. Needs daily INR.  2. Needs to be on lovenox until INR is therapeutic.  3. Follow BP, hold BP as needed for hypotension.   Discharge Diagnoses:    TIA (transient ischemic attack)   HYPERLIPIDEMIA   HYPERTENSION, BENIGN ESSENTIAL   CAD   CHF   COPD   Embolic cerebral infarction   Late effects of cerebrovascular accident   Discharge Condition: Stable.   Diet recommendation: heart healthy  Filed Weights   12/16/12 1200 12/18/12 1100  Weight: 51.2 kg (112 lb 14 oz) 37.331 kg (82 lb 4.8 oz)    History of present illness:  [name] is a 67 y.o. female with a recent CVA on 11/24/2012 discharged from Rehab 3 days ago who presents with worsening dysarthria and Ataxia. Her symptoms began at 5 pm, and she was brought to the ED for evaluation as a Code Stroke and was seen by Neurologist Dr. Thad Ranger and deemed not appropriate for TPA Rx since she is on Coumadin Rx, and was previously treated with TPA. A CT scan was performed and was found to be negative for new findings, and her coumadin level was found to be sub therapeutic.    Hospital Course:  1. Left-sided weakness and dysarthria probably due to another stroke/TIA.  -History of apical Thrombus. She was recently admitted for stroke and discharged on Coumadin, INR was sub-therapeutic on admission.  - Seen by neuro, case discussed with them they want to continue her on Coumadin rather than a newer agent. MRI noted.  -Placed on statin.  - Dys 1 diet with Full aspiration precautions.  -PT recommend SNF.  -Continue with Lovenox and coumadin bridge until INR at goal for apical thrombus.  -INR at 1.5.  2. Chronic systolic heart failure EF 10-15% per recent echo history of apical thrombus.  Compensated.  -Continue with coreg. low-dose ACE inhibitor as tolerated by blood pressure. Resume lasix at discharge, monitor BP>  3. Hyperlipidemia. Placed Lipitor.  4. History of COPD. Stable, nebulizer oxygen as needed.  5. History of CAD. Chest pain-free no acute issues.  6. Hypotension. Resolved with Gentle IV fluids, parameters written to hold blood pressure medications.  7-hypomagnesemia; replaced.    Procedures: MRI MRA brain   Consultations:  Neurology  Discharge Exam: Filed Vitals:   12/20/12 1022  BP: 120/82  Pulse: 51  Temp: 98.3 F (36.8 C)  Resp: 24    General: no distress.  Cardiovascular: S 1, S 2 RRR Respiratory: CTA  Discharge Instructions  Discharge Orders   Future Appointments Provider Department Dept Phone   01/11/2013 9:15 AM Erick Colace, MD [facility] [phone]   Future Orders Complete By Expires   Diet - low sodium heart healthy  As directed    Increase activity slowly  As directed        Medication List         ALPRAZolam 0.25 MG tablet  Commonly known as:  XANAX  Take 1 tablet (0.25 mg total) by mouth every 4 (four) hours as needed for anxiety.     atorvastatin 10 MG tablet  Commonly known as:  LIPITOR  Take 1 tablet (10 mg total) by mouth daily at 6 PM.     budesonide-formoterol 80-4.5 MCG/ACT inhaler  Commonly known as:  SYMBICORT  Inhale 2 puffs into the lungs 2 (two) times daily.     carvedilol 3.125 MG tablet  Commonly known as:  COREG  Take 1 tablet (3.125 mg total) by mouth 2 (two) times daily with a meal.     citalopram 10 MG tablet  Commonly known as:  CELEXA  Take 1 tablet (10 mg total) by mouth daily.     enoxaparin 40 MG/0.4ML injection  Commonly known as:  LOVENOX  Inject 0.35 mLs (35 mg total) into the skin every 12 (twelve) hours.     furosemide 40 MG tablet  Commonly known as:  LASIX  Take 40 mg by mouth daily.     multivitamin with minerals Tabs tablet  Take 1 tablet by  mouth daily.     nitroGLYCERIN 0.4 MG SL tablet  Commonly known as:  NITROSTAT  Place 1 tablet (0.4 mg total) under the tongue every 5 (five) minutes as needed. For chest pain     PROVENTIL 90 MCG/ACT inhaler  Generic drug:  albuterol  - Inhale 2 puffs into the lungs every 6 (six) hours as needed. For wheezing  -      QUEtiapine 25 MG tablet  Commonly known as:  SEROQUEL  Take 1 tablet (25 mg total) by mouth at bedtime.     ramipril 2.5 MG capsule  Commonly known as:  ALTACE  Take 1 capsule (2.5 mg total) by mouth daily.     warfarin 5 MG tablet  Commonly known as:  COUMADIN  Take 1 tablet (5 mg total) by mouth daily.       Allergies  Allergen Reactions  . Codeine Itching    All over the body  . Penicillins Hives and Other (See Comments)    "Whelps" per patient.  . Hydralazine     02/28/11 Family unsure if this is allergy for pt       Follow-up Information   Schedule an appointment as soon as possible for a visit with Gates Rigg, MD. (stroke clinic)    Specialties:  Neurology, Radiology   Contact information:   [address] [place] [address] [phone]        The results of significant diagnostics from this hospitalization (including imaging, microbiology, ancillary and laboratory) are listed below for reference.    Significant Diagnostic Studies: Dg Chest 2 View  12/15/2012   CLINICAL DATA:  Stroke. CVA.  EXAM: CHEST  2 VIEW  COMPARISON:  11/24/2012.  FINDINGS: The cardiopericardial silhouette is upper limits of normal for projection. Aortic arch atherosclerosis. Emphysema with bilateral pleural apical scarring. Basilar atelectasis is present on the right, mild. Mild prominence of the ascending aorta on the lateral view it is probably due to uncoiling and tortuosity.  IMPRESSION: No interval change or active cardiopulmonary disease.   Electronically Signed   By: Andreas Newport M.D.   On: 12/15/2012 21:39   Ct Head Wo  Contrast  12/15/2012   ADDENDUM REPORT: 12/15/2012 18:28  ADDENDUM: Critical Value/emergent results were called by telephone at the time of interpretation on 12/15/2012 at 6:27 PM to Dr.Luty, who verbally acknowledged these results.   Electronically Signed   By: Esperanza Heir M.D.   On: 12/15/2012 18:28   12/15/2012   CLINICAL DATA:  Code stroke, slurred speech, facial droop, head TPA 3 weeks ago  EXAM: CT HEAD WITHOUT CONTRAST  TECHNIQUE: Contiguous axial images were obtained from the base of the skull through the vertex without intravenous contrast.  COMPARISON:  11/25/2012  FINDINGS: No evidence of hemorrhage or extra-axial fluid. Multiple areas of infarction identified bilaterally including anterior cerebral artery territory, right posterior parieto-occipital region, right cerebellum, left temporoparietal area, left occipital lobe, and left cerebellar hemisphere. These were all present previously. There is diffuse atrophy.  IMPRESSION: Multiple areas of subacute to chronic infarction, all of which were present on the prior study. No evidence of hemorrhage or extra-axial fluid.I paged Dr. Thad Ranger at 1815 on 12/15/12.  Electronically Signed: By: Esperanza Heir M.D. On: 12/15/2012 18:19   Ct Head Wo Contrast  11/25/2012   CLINICAL DATA:  Status post tPA dose  EXAM: CT HEAD WITHOUT CONTRAST  TECHNIQUE: Contiguous axial images were obtained from the base of the skull through the vertex without intravenous contrast.  COMPARISON:  11/24/2012  FINDINGS: The bony calvarium is again intact. There again noted changes of prior infarcts bilaterally stable from the prior exam. No acute hemorrhage is identified. Chronic white matter ischemic and atrophic changes are seen. New rounded area of hypodensity is noted in the left cerebellum consistent with the recent infarct. This is new from the prior exam.  IMPRESSION: Acute left cerebellar infarct with some progression from the prior exam. . The hypodensity measures  approximately 2.8 x 1.8 cm in greatest transverse and AP views dimensions respectively. This was not well seen on the recent CT examination but was identified on the previous MRI.  No acute hemorrhage is seen.   Electronically Signed   By: Alcide Clever M.D.   On: 11/25/2012 19:21   Ct Head (brain) Wo Contrast  11/24/2012   CLINICAL DATA:  Could stroke, new onset slurred speech. Multiple previous cerebral infarctions.  EXAM: CT HEAD WITHOUT CONTRAST  TECHNIQUE: Contiguous axial images were obtained from the base of the skull through the vertex without intravenous contrast.  COMPARISON:  Head CT 06/24/2012  FINDINGS: There are multiple cortical infarctions unchanged from prior CT including the medial right frontal lobe, right parietal lobe, left temporal operculum, and medial left occipital lobe.  No CT evidence of acute cortical infarction. No intracranial hemorrhage. There is ventricular dilatation associated with the remote infarctions. There is periventricular and deep white matter hypodensities unchanged also.  Paranasal sinuses and mastoid air cells are clear.  IMPRESSION: 1. No evidence of acute cortical infarction.  2. No evidence of intracranial hemorrhage.  3. Multiple remote cortical hemispheric infarctions are again demonstrated.  4.  Atrophy and chronic microvascular disease again demonstrated.  Findings conveyed to Dr. Amada Jupiter on 11/24/2012 at17:42.   Electronically Signed   By: Genevive Bi M.D.   On: 11/24/2012 17:44   Mr Brain Wo Contrast  11/24/2012   CLINICAL DATA:  Code stroke.  Altered mental status. Dementia.  EXAM: MRI HEAD WITHOUT CONTRAST  TECHNIQUE: Multiplanar, multisequence MR imaging was performed. No intravenous contrast was administered.  COMPARISON:  CT head earlier in the day.  FINDINGS: The patient was only able to undergo diffusion imaging and a single FLAIR sequence, beyond which she could not remain motionless. Important information was obtained however.  There is  evidence for an acute left superior cerebellar infarct affecting much of the hemisphere and also a portion of the superior vermis. There is no significant mass effect on the 4th ventricle and no visible hemorrhage.  In addition, there is a 2nd, acute right posterior temporal infarction affecting the cortex and subcortical white matter. This infarction is approximately 2 cm cross-section. Again no visible in areas of hemorrhage.  Extensive encephalomalacia related to remote  bihemispheric infarctions. Advanced premature atrophy and chronic microvascular ischemic change.  IMPRESSION: Acute infarction affects the left cerebellum and right posterior temporal regions. See discussion above.   Electronically Signed   By: Davonna Belling M.D.   On: 11/24/2012 18:47   Dg Chest Port 1 View  11/24/2012   CLINICAL DATA:  Elevated troponins  EXAM: PORTABLE CHEST - 1 VIEW  COMPARISON:  06/28/2012  FINDINGS: The heart size and mediastinal contours are within normal limits. Both lungs are clear. The visualized skeletal structures show old rib fractures on the left.  IMPRESSION: No active disease.   Electronically Signed   By: Alcide Clever M.D.   On: 11/24/2012 20:17   Dg Swallowing Func-speech Pathology  11/26/2012   Riley Nearing Deblois, CCC-SLP     11/26/2012  1:54 PM Objective Swallowing Evaluation: Modified Barium Swallowing Study   Patient Details  Name: KENIDI ELENBAAS MRN: [id number] Date of Birth: 1946-02-06  Today's Date: 11/26/2012 Time: 1130-1205 SLP Time Calculation (min): 35 min  Past Medical History:  Past Medical History  Diagnosis Date  . Weight loss, unintentional   . Trouble swallowing   . Change in voice   . Substance abuse   . Dementia   . Asthma   . Shortness of breath   . Seizures     "    It has been along time "  . CHF (congestive heart failure)   . Headache(784.0)   . Hypertension   . Arthralgia   . Tremors of nervous system   . Anxiety   . Rib fractures     hx of May 2012  . Heart attack 05/21/10  .  Schizophrenia   . Coronary artery disease   . Stroke 07/11/10    hx of R CVA   . COPD (chronic obstructive pulmonary disease)   . Obstructive sleep apnea   . Breast cancer 01/28/11    L breast, inv ductal/in situ, ER/PR -, Her2 -   Past Surgical History:  Past Surgical History  Procedure Laterality Date  . Total abdominal hysterectomy  age 66   HPI:  MOANI WEIPERT is a 67 y.o. female who was apparently eating a  english muffin earlier when she became suddenly dysarthric. It is  unclear if she had any other changes earlier. She has had  multiple strokes in the past and volunteers that she is not  taking any medications.  PMH significiant for seizures, dementia,  substance abuse, HTN COPD, and non-ischemic cardiomyopathy.      Assessment / Plan / Recommendation Clinical Impression  Dysphagia Diagnosis: Moderate oral phase dysphagia;Moderate  pharyngeal phase dysphagia Clinical impression: Pt presents with a primary oral dysphagia  with limited coordinated movement for bolus formation. There is a  thrusting movment for propulsion that leads to premature spillage  with delayed swallow initiation. Moderate residuals remain post  swallow, primarily in right buccal cavity that are likely to  spill to pharynx post swallow. There was one instance of silent  aspiration before the swallow due to particularly rapid, forceful  transit. Thin liquids were consistently aspirated. Otherwise  nectar thick liquids and pureed solids appear to be safest diet.  Pt will need reminders to clear right side and swallow again.  Capacity to recall and independently use this strategy appear  limited. Suggest frequent oral suction to right side during meals  as well.     Treatment Recommendation  Therapy as outlined in treatment plan below    Diet Recommendation Dysphagia  1 (Puree);Nectar-thick liquid   Liquid Administration via: Cup Medication Administration: Whole meds with puree Supervision: Full supervision/cueing for compensatory  strategies Compensations: Slow rate;Small sips/bites;Check for  pocketing;Check for anterior loss;Multiple dry swallows after  each bite/sip Postural Changes and/or Swallow Maneuvers: Seated upright 90  degrees;Upright 30-60 min after meal    Other  Recommendations Oral Care Recommendations: Oral care  before and after PO Other Recommendations: Order thickener from pharmacy   Follow Up Recommendations  Skilled Nursing facility    Frequency and Duration min 2x/week  2 weeks   Pertinent Vitals/Pain NA    SLP Swallow Goals Patient will utilize recommended strategies during swallow to  increase swallowing safety with: Moderate assistance Swallow Study Goal #2 - Progress: Progressing toward goal   General HPI: AISHA GREENBERGER is a 67 y.o. female who was  apparently eating a english muffin earlier when she became  suddenly dysarthric. It is unclear if she had any other changes  earlier. She has had multiple strokes in the past and volunteers  that she is not taking any medications.  PMH significiant for  seizures, dementia, substance abuse, HTN COPD, and non-ischemic  cardiomyopathy.  Type of Study: Modified Barium Swallowing Study Reason for Referral: Objectively evaluate swallowing function Previous Swallow Assessment: MBS 2012 - Dys 1/nectar Diet Prior to this Study: NPO Temperature Spikes Noted: No Respiratory Status: Room air History of Recent Intubation: No Behavior/Cognition: Alert;Cooperative;Confused;Requires  cueing;Decreased sustained attention Oral Cavity - Dentition: Dentures, top;Missing dentition Oral Motor / Sensory Function: Impaired - see Bedside swallow  eval Self-Feeding Abilities: Needs assist Patient Positioning: Upright in chair Baseline Vocal Quality: Low vocal intensity Volitional Cough: Weak Volitional Swallow: Able to elicit Anatomy: Within functional limits Pharyngeal Secretions: Not observed secondary MBS    Reason for Referral Objectively evaluate swallowing function   Oral Phase Oral  Preparation/Oral Phase Oral Phase: Impaired Oral Phase - Comment Oral Phase - Comment: Oral residual particularly in right buccal  cavity, lingual thrusting for bolus formation. Significant  residuals with mechanical soft texture.    Pharyngeal Phase Pharyngeal Phase Pharyngeal Phase: Impaired Pharyngeal - Nectar Pharyngeal - Nectar Teaspoon: Delayed swallow  initiation;Premature spillage to valleculae Pharyngeal - Nectar Cup: Delayed swallow initiation;Premature  spillage to valleculae;Penetration/Aspiration before  swallow;Trace aspiration Penetration/Aspiration details (nectar cup): Material enters  airway, passes BELOW cords without attempt by patient to eject  out (silent aspiration);Material does not enter airway Pharyngeal - Nectar Straw: Premature spillage to pyriform  sinuses;Delayed swallow initiation Pharyngeal - Thin Pharyngeal - Thin Cup: Delayed swallow initiation;Premature  spillage to pyriform sinuses;Penetration/Aspiration before  swallow Penetration/Aspiration details (thin cup): Material enters  airway, passes BELOW cords without attempt by patient to eject  out (silent aspiration) Pharyngeal - Solids Pharyngeal - Puree: Delayed swallow initiation;Premature spillage  to valleculae Pharyngeal - Mechanical Soft: Delayed swallow  initiation;Premature spillage to valleculae  Cervical Esophageal Phase    GO    Cervical Esophageal Phase Cervical Esophageal Phase: [facility]        Harlon Ditty, MA CCC-SLP [phone]  [place], Riley Nearing 11/26/2012, 1:53 PM    Mr Brain Ltd W/o Cm  12/16/2012   CLINICAL DATA:  Left-sided weakness. Multiple remote infarcts.  EXAM: MRI HEAD WITHOUT CONTRAST  TECHNIQUE: Multiplanar, multisequence MR imaging was performed. No intravenous contrast was administered.  COMPARISON:  CT head without contrast 11/25/2012. MRI brain without contrast 11/24/2012.  FINDINGS: Only a diffusion sequence was performed. The patient refused additional imaging. The notes state that the patient  is a candidate  for conscious sedation if further imaging is required.  The diffusion sequence demonstrates no evidence for acute or subacute infarction. The previously seen acute infarcts no longer exhibit restricted diffusion. Multiple areas of remote infarction are again noted.  IMPRESSION: 1. Diffusion-weighted imaging demonstrates no evidence for acute infarction. 2. Previously seen acute infarcts no longer exhibit restricted diffusion.   Electronically Signed   By: Gennette Pac M.D.   On: 12/16/2012 12:59   Mr Maxine Glenn Head/brain Wo Cm  11/25/2012   CLINICAL DATA:  Followup CVA affecting the left cerebellum and right posterior temporal regions. Stroke risk factors include hypertension, cocaine abuse, previous stroke, and myocardial infarction.  EXAM: MRA HEAD WITHOUT CONTRAST  TECHNIQUE: MRA HEAD WITHOUT CONTRAST  COMPARISON:  MRI brain performed 11/24/2012. MRA head performed 09/27/2011.  FINDINGS: The right internal carotid artery is larger than the left due to supply of both anterior cerebrals from the right. There is no right or left carotid stenosis.  Hypoplastic A1 ACA on the left. No proximal M1 or A1 stenosis on the right. No M1 stenosis on the left.  Widely patent basilar artery with left vertebral dominant. Mild non stenotic irregularity distal V4 segment, right vertebral artery. This is not flow reducing. This is stable from previous. Both PICA branches are patent.  There is no proximal PCA stenosis. Both SCA origins are patent. Neither AICA branch is seen.  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  Distal right ACA branches are diseased. For flow related enhancement in the bilateral middle cerebral artery M2 and M3 branches appear slightly diminished from priors.  Compared with previous exam, flow related enhancement in the distal MCA branches is less well seen today. No progression of posterior circulation disease.  IMPRESSION: No proximal ICA, MCA, basilar, or vertebral flow reducing  lesion. Mild intracranial atherosclerotic change of the distal MCA vessels may have slightly progressed from 2013. 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   Electronically Signed   By: Davonna Belling M.D.   On: 11/25/2012 10:20    Microbiology: Recent Results (from the past 240 hour(s))  URINE CULTURE     Status: None   Collection Time    12/18/12 10:47 AM      Result Value Range Status   Specimen Description URINE, RANDOM   Final   Special Requests NONE   Final   Culture  Setup Time     Final   Value: 12/18/2012 17:09     Performed at [facility] Count     Final   Value: 70,000 COLONIES/ML     Performed at [facility]   Culture     Final   Value: Multiple bacterial morphotypes present, none predominant. Suggest appropriate recollection if clinically indicated.     Performed at [facility]   Report Status 12/19/2012 FINAL   Final     Labs: Basic Metabolic Panel:  Recent Labs Lab 12/15/12 1822 12/15/12 1837 12/16/12 1035 12/17/12 0524 12/18/12 0530 12/19/12 0512 12/20/12 0530  NA 144 144  --  140 140 138  --   K 3.8 3.9  --  3.8 4.0 4.1  --   CL 104 106  --  104 107 107  --   CO2 26  --   --  28 23 23   --   GLUCOSE 85 88  --  79 77 74  --   BUN 19 19  --  13 8 8   --   CREATININE  1.24* 1.50*  --  0.99 0.93 0.91  --   CALCIUM 10.0  --   --  9.3 8.6 8.8  --   MG  --   --   --  2.2 1.7 1.6 1.7  PHOS  --   --  3.8  --   --   --   --    Liver Function Tests:  Recent Labs Lab 12/15/12 1822  AST 24  ALT 11  ALKPHOS 84  BILITOT 0.8  PROT 7.9  ALBUMIN 4.0   No results found for this basename: LIPASE, AMYLASE,  in the last 168 hours No results found for this basename: AMMONIA,  in the last 168 hours CBC:  Recent Labs Lab 12/15/12 1822 12/15/12 1837 12/16/12 0900 12/17/12 0524 12/18/12 0530  WBC 5.7  --  4.2 4.1 3.1*  NEUTROABS 4.2  --   --   --   --   HGB 14.3 14.6 12.4 13.1 11.4*  HCT 42.5 43.0 37.3 39.7 33.7*  MCV 92.4  --   92.3 92.8 91.6  PLT 204  --  210 202 187   Cardiac Enzymes:  Recent Labs Lab 12/15/12 1822 12/16/12 1035 12/16/12 1600 12/16/12 2117  TROPONINI <0.30 <0.30 <0.30 <0.30   BNP: BNP (last 3 results) No results found for this basename: PROBNP,  in the last 8760 hours CBG:  Recent Labs Lab 12/15/12 1838  GLUCAP 101*       Signed:  Keino Placencia  [facility] 12/20/2012, 11:21 AM

## 2012-12-20 NOTE — Progress Notes (Signed)
CSW (Clinical Child psychotherapist) prepared pt dc packet and placed with shadow chart. Transportation with non-emergent ambulance has been called for 3:30pm. Voicemail left for pt family to notify. Facility and nurse informed. CSW signing off.  Tigran Haynie, LCSWA [phone]

## 2013-01-11 ENCOUNTER — Encounter: Payer: Medicare Other | Attending: Physical Medicine & Rehabilitation

## 2013-01-11 ENCOUNTER — Ambulatory Visit (HOSPITAL_BASED_OUTPATIENT_CLINIC_OR_DEPARTMENT_OTHER): Payer: Medicare Other | Admitting: Physical Medicine & Rehabilitation

## 2013-01-11 ENCOUNTER — Encounter: Payer: Self-pay | Admitting: Physical Medicine & Rehabilitation

## 2013-01-11 VITALS — BP 108/46 | HR 70 | Resp 16 | Ht 61.0 in | Wt 80.0 lb

## 2013-01-11 DIAGNOSIS — I69959 Hemiplegia and hemiparesis following unspecified cerebrovascular disease affecting unspecified side: Secondary | ICD-10-CM | POA: Insufficient documentation

## 2013-01-11 DIAGNOSIS — J4489 Other specified chronic obstructive pulmonary disease: Secondary | ICD-10-CM

## 2013-01-11 DIAGNOSIS — G8110 Spastic hemiplegia affecting unspecified side: Secondary | ICD-10-CM

## 2013-01-11 DIAGNOSIS — J449 Chronic obstructive pulmonary disease, unspecified: Secondary | ICD-10-CM

## 2013-01-11 NOTE — Progress Notes (Signed)
Subjective:    Patient ID: [name], female    DOB: 1945-05-09, 67 y.o.   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  Admitted November 24, 2012, with slurred speech. MRI of the brain showed  acute infarcts affecting the left cerebellum and right posterior  temporal regions. Also noted extensive encephalomalacia related to  remote bihemispheric infarction. 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 Echocardiogram with ejection fraction of 15%. No wall motion  abnormalities and small apical thrombus. Carotid Dopplers with no ICA  stenosis. Neurology Service is consulted. The patient did receive t-  PA. Findings of elevated troponin, small non-Q-wave myocardial  infarction. Follow up Cardiology Services, placed on Coumadin therapy  for cardioembolic CVA, as   HPI +dysarthria No complaints disoriented Pain Inventory Average Pain 0 Pain Right Now 0 My pain is aching  In the last 24 hours, has pain interfered with the following? General activity 8 Relation with others 8 Enjoyment of life 8 What TIME of day is your pain at its worst? daytime Sleep (in general) Good  Pain is worse with: some activites Pain improves with: pacing activities Relief from Meds: n/a  Mobility how many minutes can you walk? 2 use a wheelchair  Function retired I need assistance with the following:  feeding, dressing, bathing, toileting, meal prep, household duties and shopping  Neuro/Psych bladder control problems bowel control problems tingling trouble walking depression anxiety  Prior Studies Any changes since last visit?  no  Physicians involved in your care Any changes since last visit?   no   Family History  Problem Relation Age of Onset  . Breast cancer Mother   . Birth defects Mother     breast  . Cancer Mother     breast  . Heart disease Father     heart attack  . Heart attack Father   . Heart attack Brother   . Cancer Brother     throat, lung  . Cancer Paternal Aunt   . Cancer Maternal Grandmother     breast    History   Social History  . Marital Status: Married    Spouse Name: N/A    Number of Children: 1  . Years of Education: N/A   Occupational History  . UNEMPLOYED    Social History Main Topics  . Smoking status: Former Smoker -- 1.00 packs/day for 50 years    Types: Cigarettes    Quit date: 02/22/2008  . Smokeless tobacco: Never Used     Comment: started smoking at age 11  . Alcohol Use: Yes     Comment: social use  . Drug Use: Yes     Comment: hx of cocaine use until 04/2010  . Sexual Activity: Not Currently    Birth Control/ Protection: Post-menopausal     Comment: s/p hysterectomy   Other Topics Concern  . None   Social History Narrative  . None   Past Surgical History  Procedure Laterality Date  . Total abdominal hysterectomy  age 61   Past Medical History  Diagnosis Date  . Weight loss, unintentional   . Trouble swallowing   . Change in voice   . Substance abuse   . Dementia   .  Asthma   . Shortness of breath   . Seizures     "    It has been along time "  . CHF (congestive heart failure)   . Headache(784.0)   . Hypertension   . Arthralgia   . Tremors of nervous system   . Anxiety   . Rib fractures     hx of May 2012  . Heart attack 05/21/10  . Schizophrenia   . Coronary artery disease   . Stroke 07/11/10    hx of R CVA   . COPD (chronic obstructive pulmonary disease)   . Obstructive sleep apnea   . Breast cancer 01/28/11    L breast, inv ductal/in situ, ER/PR -, Her2 -   BP 108/46  Pulse 70  Resp 16  Ht 5\' 1"  (1.549 m)  Wt 80 lb (36.288 kg)  BMI 15.12 kg/m2  SpO2 92%     Review of Systems   Musculoskeletal: Positive for arthralgias, gait problem and myalgias.  Neurological: Positive for dizziness.  Psychiatric/Behavioral: Positive for confusion and dysphoric mood. The patient is nervous/anxious.   All other systems reviewed and are negative.       Objective:   Physical Exam  Nursing note and vitals reviewed. Constitutional: She appears well-developed and well-nourished.  HENT:  Head: Atraumatic.  Eyes: Conjunctivae and EOM are normal. Pupils are equal, round, and reactive to light.  Neck: Normal range of motion.  Musculoskeletal:       Left ankle: She exhibits decreased range of motion and deformity. She exhibits no swelling and no ecchymosis. No tenderness.  Neurological:  4/5 bilateral deltoids biceps triceps grip 4/5 right hip flexor knee extensor ankle dorsiflexor plantar flexor 3 minus/5 left hip flexor knee extensor trace ankle dorsiflexor plantar flexor  Ashworth grade 4 left plantar flexor  Psychiatric: Her affect is blunt. Her speech is delayed and slurred.          Assessment & Plan:  1. 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 The upper extremity symptoms have improved has mainly weakness in the left foot and ankle area. She also has severe spasticity in that area will start Zanaflex 2 mg 3 times a day monitor for sedation as well as low blood pressure. If the patient is unable to tolerate this or if she is not responding, will do left tibial nerve neuro lysis with phenol

## 2013-01-11 NOTE — Patient Instructions (Addendum)
We'll try medication for left foot and ankle spasticity. Zanaflex 2 mg 3 times a day. This may make pt drowsy. He can also reduce blood pressure.  If spasms not much better consider left tibial nerve block next month

## 2013-02-19 ENCOUNTER — Encounter: Payer: Medicare Other | Attending: Physical Medicine & Rehabilitation

## 2013-02-19 ENCOUNTER — Ambulatory Visit: Payer: Medicare Other | Admitting: Physical Medicine & Rehabilitation

## 2013-02-19 DIAGNOSIS — I69959 Hemiplegia and hemiparesis following unspecified cerebrovascular disease affecting unspecified side: Secondary | ICD-10-CM | POA: Insufficient documentation

## 2013-04-26 ENCOUNTER — Emergency Department (HOSPITAL_COMMUNITY)
Admission: EM | Admit: 2013-04-26 | Discharge: 2013-04-27 | Disposition: A | Discharge status: Skilled Nursing Facility | Payer: Medicare Other | Attending: Emergency Medicine | Admitting: Emergency Medicine

## 2013-04-26 ENCOUNTER — Encounter (HOSPITAL_COMMUNITY): Payer: Self-pay | Admitting: Emergency Medicine

## 2013-04-26 ENCOUNTER — Emergency Department (HOSPITAL_COMMUNITY): Payer: Medicare Other

## 2013-04-26 DIAGNOSIS — I2510 Atherosclerotic heart disease of native coronary artery without angina pectoris: Secondary | ICD-10-CM | POA: Insufficient documentation

## 2013-04-26 DIAGNOSIS — J45901 Unspecified asthma with (acute) exacerbation: Secondary | ICD-10-CM

## 2013-04-26 DIAGNOSIS — Z853 Personal history of malignant neoplasm of breast: Secondary | ICD-10-CM | POA: Insufficient documentation

## 2013-04-26 DIAGNOSIS — I509 Heart failure, unspecified: Secondary | ICD-10-CM | POA: Insufficient documentation

## 2013-04-26 DIAGNOSIS — Z8781 Personal history of (healed) traumatic fracture: Secondary | ICD-10-CM | POA: Insufficient documentation

## 2013-04-26 DIAGNOSIS — Z880 Allergy status to penicillin: Secondary | ICD-10-CM | POA: Insufficient documentation

## 2013-04-26 DIAGNOSIS — Z79899 Other long term (current) drug therapy: Secondary | ICD-10-CM | POA: Insufficient documentation

## 2013-04-26 DIAGNOSIS — Z8659 Personal history of other mental and behavioral disorders: Secondary | ICD-10-CM | POA: Insufficient documentation

## 2013-04-26 DIAGNOSIS — IMO0002 Reserved for concepts with insufficient information to code with codable children: Secondary | ICD-10-CM | POA: Insufficient documentation

## 2013-04-26 DIAGNOSIS — R0602 Shortness of breath: Secondary | ICD-10-CM

## 2013-04-26 DIAGNOSIS — F419 Anxiety disorder, unspecified: Secondary | ICD-10-CM

## 2013-04-26 DIAGNOSIS — F411 Generalized anxiety disorder: Secondary | ICD-10-CM | POA: Insufficient documentation

## 2013-04-26 DIAGNOSIS — I10 Essential (primary) hypertension: Secondary | ICD-10-CM | POA: Insufficient documentation

## 2013-04-26 DIAGNOSIS — F29 Unspecified psychosis not due to a substance or known physiological condition: Secondary | ICD-10-CM | POA: Insufficient documentation

## 2013-04-26 DIAGNOSIS — Z87891 Personal history of nicotine dependence: Secondary | ICD-10-CM | POA: Insufficient documentation

## 2013-04-26 DIAGNOSIS — F0390 Unspecified dementia without behavioral disturbance: Secondary | ICD-10-CM | POA: Insufficient documentation

## 2013-04-26 DIAGNOSIS — J441 Chronic obstructive pulmonary disease with (acute) exacerbation: Secondary | ICD-10-CM | POA: Insufficient documentation

## 2013-04-26 DIAGNOSIS — I252 Old myocardial infarction: Secondary | ICD-10-CM | POA: Insufficient documentation

## 2013-04-26 DIAGNOSIS — Z8669 Personal history of other diseases of the nervous system and sense organs: Secondary | ICD-10-CM | POA: Insufficient documentation

## 2013-04-26 HISTORY — DX: Depression, unspecified: F32.A

## 2013-04-26 HISTORY — DX: Major depressive disorder, single episode, unspecified: F32.9

## 2013-04-26 LAB — CBC WITH DIFFERENTIAL/PLATELET
BASOS PCT: 0 % (ref 0–1)
Basophils Absolute: 0 10*3/uL (ref 0.0–0.1)
Eosinophils Absolute: 0.1 10*3/uL (ref 0.0–0.7)
Eosinophils Relative: 1 % (ref 0–5)
HCT: 40.5 % (ref 36.0–46.0)
HEMOGLOBIN: 13.5 g/dL (ref 12.0–15.0)
LYMPHS ABS: 1 10*3/uL (ref 0.7–4.0)
Lymphocytes Relative: 15 % (ref 12–46)
MCH: 32.7 pg (ref 26.0–34.0)
MCHC: 33.3 g/dL (ref 30.0–36.0)
MCV: 98.1 fL (ref 78.0–100.0)
MONOS PCT: 9 % (ref 3–12)
Monocytes Absolute: 0.6 10*3/uL (ref 0.1–1.0)
NEUTROS ABS: 4.9 10*3/uL (ref 1.7–7.7)
NEUTROS PCT: 75 % (ref 43–77)
Platelets: ADEQUATE 10*3/uL (ref 150–400)
RBC: 4.13 MIL/uL (ref 3.87–5.11)
RDW: 14 % (ref 11.5–15.5)
WBC: 6.6 10*3/uL (ref 4.0–10.5)

## 2013-04-26 LAB — BASIC METABOLIC PANEL
BUN: 31 mg/dL — AB (ref 6–23)
CO2: 25 mEq/L (ref 19–32)
Calcium: 9.5 mg/dL (ref 8.4–10.5)
Chloride: 103 mEq/L (ref 96–112)
Creatinine, Ser: 1.37 mg/dL — ABNORMAL HIGH (ref 0.50–1.10)
GFR calc Af Amer: 45 mL/min — ABNORMAL LOW (ref >90)
GFR calc non Af Amer: 39 mL/min — ABNORMAL LOW (ref >90)
Glucose, Bld: 84 mg/dL (ref 70–99)
Potassium: 4.7 mEq/L (ref 3.7–5.3)
Sodium: 140 mEq/L (ref 137–147)

## 2013-04-26 NOTE — ED Notes (Signed)
IV team at bedside 

## 2013-04-26 NOTE — ED Notes (Signed)
Pt's SisterStarleen Blue Chance [phone]

## 2013-04-26 NOTE — ED Notes (Signed)
Pt from [facility] via [facility] for shortness of breath.  Staff called after seeing pt breathing rapidly and shallowly with SpO2 in the low 80s.  Placed on non-rebreather at 100% SpO2 when EMS arrived, respirations 22/min.  Calmed pt down and 96% on RA.  Pt in NAD, orientation baseline.

## 2013-04-26 NOTE — ED Notes (Signed)
IV team paged.  

## 2013-04-27 NOTE — ED Provider Notes (Signed)
CSN: [id number]     Arrival date & time 04/26/13  1750 History   First MD Initiated Contact with Patient 04/26/13 1751     Chief Complaint  Patient presents with  . Shortness of Breath  . Anxiety     (Consider location/radiation/quality/duration/timing/severity/associated sxs/prior Treatment) Patient is a 68 y.o. female presenting with shortness of breath and anxiety. The history is provided by the patient.  Shortness of Breath Associated symptoms: no abdominal pain, no chest pain, no fever, no headaches and no rash   Anxiety Associated symptoms include shortness of breath. Pertinent negatives include no chest pain, no abdominal pain and no headaches.   Patient brought in from [facility] care via EMS. For shortness of breath. Staff called after seeing patient breathing rapidly and shallowly with a saturation in the low 80s. Placed on a nonrebreather at 100% saturation. When EMS arrived respirations were 22 respirations per minute. Patient was calm they removed 100% nonrebreather on room air sats were 96%. Patient in no acute distress upon arrival here the patient's mental status is baseline she does have a history of dementia but she is able to answer most questions. Patient denies any chest pain shortness of breath and trouble breathing at this time. Patient does have a history of CHF and has a history of COPD.  Past Medical History  Diagnosis Date  . Weight loss, unintentional   . Trouble swallowing   . Change in voice   . Substance abuse   . Dementia   . Asthma   . Shortness of breath   . Seizures     "    It has been along time "  . CHF (congestive heart failure)   . Headache(784.0)   . Hypertension   . Arthralgia   . Tremors of nervous system   . Anxiety   . Rib fractures     hx of May 2012  . Heart attack 05/21/10  . Schizophrenia   . Coronary artery disease   . Stroke 07/11/10    hx of R CVA   . COPD (chronic obstructive pulmonary disease)   . Obstructive sleep apnea   .  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 Schizophrenia   . Depressive disorder    Past Surgical History  Procedure Laterality Date  . Total abdominal hysterectomy  age 7   Family History  Problem Relation Age of Onset  . Breast cancer Mother   . Birth defects Mother     breast  . Cancer Mother     breast  . Heart disease Father     heart attack  . Heart attack Father   . Heart attack Brother   . Cancer Brother     throat, lung  . Cancer Paternal Aunt   . Cancer Maternal Grandmother     breast    History  Substance Use Topics  . Smoking status: Former Smoker -- 1.00 packs/day for 50 years    Types: Cigarettes    Quit date: 02/22/2008  . Smokeless tobacco: Never Used     Comment: started smoking at age 38  . Alcohol Use: No     Comment: social use   OB History   Grav Para Term Preterm Abortions TAB SAB Ect Mult Living                 Review of Systems  Constitutional: Negative for fever.  HENT: Negative for congestion.  Eyes: Negative for redness.  Respiratory: Positive for shortness of breath.   Cardiovascular: Negative for chest pain.  Gastrointestinal: Negative for abdominal pain.  Genitourinary: Negative for dysuria.  Musculoskeletal: Negative for back pain.  Skin: Negative for rash.  Neurological: Negative for headaches.  Hematological: Does not bruise/bleed easily.  Psychiatric/Behavioral: Positive for confusion.      Allergies  Codeine; Penicillins; and Hydralazine  Home Medications   Current Outpatient Rx  Name  Route  Sig  Dispense  Refill  . albuterol (PROVENTIL) 90 MCG/ACT inhaler   Inhalation   Inhale 2 puffs into the lungs every 6 (six) hours as needed. For wheezing          . budesonide-formoterol (SYMBICORT) 80-4.5 MCG/ACT inhaler   Inhalation   Inhale 2 puffs into the lungs 2 (two) times daily.         . furosemide (LASIX) 40 MG tablet   Oral   Take 40 mg by mouth daily.         .  Multiple Vitamin (MULTIVITAMIN WITH MINERALS) TABS tablet   Oral   Take 1 tablet by mouth daily.         . nitroGLYCERIN (NITROSTAT) 0.4 MG SL tablet   Sublingual   Place 1 tablet (0.4 mg total) under the tongue every 5 (five) minutes as needed. For chest pain   30 tablet   12    BP 110/87  Pulse 70  Temp(Src) 97.8 F (36.6 C) (Oral)  Resp 25  SpO2 95% Physical Exam  Nursing note and vitals reviewed. Constitutional: She is oriented to person, place, and time. She appears well-developed and well-nourished. No distress.  HENT:  Head: Normocephalic and atraumatic.  Mouth/Throat: Oropharynx is clear and moist.  Eyes: Conjunctivae are normal. Pupils are equal, round, and reactive to light.  Neck: Normal range of motion.  Cardiovascular: Normal rate, regular rhythm and normal heart sounds.   No murmur heard. Pulmonary/Chest: Effort normal and breath sounds normal. No respiratory distress. She has no wheezes. She has no rales.  Abdominal: Soft. Bowel sounds are normal. There is no tenderness.  Musculoskeletal: Normal range of motion. She exhibits no edema.  Neurological: She is alert and oriented to person, place, and time. No cranial nerve deficit. She exhibits normal muscle tone. Coordination normal.  Skin: Skin is warm. No rash noted.    ED Course  Procedures (including critical care time) Labs Review Labs Reviewed  BASIC METABOLIC PANEL - Abnormal; Notable for the following:    BUN 31 (*)    Creatinine, Ser 1.37 (*)    GFR calc non Af Amer 39 (*)    GFR calc Af Amer 45 (*)    All other components within normal limits  CBC WITH DIFFERENTIAL   Imaging Review Dg Chest 2 View  04/26/2013   CLINICAL DATA:  Shortness of breath, anxiety.  COPD.  EXAM: CHEST  2 VIEW  COMPARISON:  12/15/2012  FINDINGS: Mild hyperinflation of the lungs. Borderline cardiomegaly. No confluent opacities or effusions. No acute bony abnormality. Multiple old healed left-sided rib fractures.   IMPRESSION: Mild hyperinflation and borderline cardiomegaly.  No acute findings.   Electronically Signed   By: Rolm Baptise M.D.   On: 04/26/2013 20:44     EKG Interpretation   Date/Time:  Friday April 26 2013 18:16:47 EST Ventricular Rate:  73 PR Interval:  146 QRS Duration: 140 QT Interval:  456 QTC Calculation: 502 R Axis:   -78 Text Interpretation:  Sinus rhythm IVCD,  consider atypical RBBB LVH with  secondary repolarization abnormality Anterior Q waves, possibly due to LVH  No significant change since last tracing Confirmed by Che Below  MD, Karem Tomaso  [phone]) on 04/26/2013 11:37:43 PM      MDM   Final diagnoses:  Anxiety  Shortness of breath    Patient brought in by EMS from [facility] for shortness of breath questionable low sats patient upon arrival here of had no respiratory difficulty no wheezing sats on room air were 95% or better. Patient in no distress cooperative not anxious.  Patient's chest x-ray shows no acute changes. Labs without any significant abnormalities other than some mild renal insufficiency. Patient in no acute distress nontoxic extended period of monitoring with normal room air sats. Does not appear to be an exacerbation of COPD. Patient has a history of dementia but at his baseline. Patient certainly able to provide stork information here tonight. Patient will be discharged back to [facility] care.    Mervin Kung, MD 04/27/13 [phone]

## 2013-04-27 NOTE — ED Notes (Signed)
Assisted  Pt in getting dressed and given ginger ale and crackers.

## 2013-04-27 NOTE — Discharge Instructions (Signed)
[name] workup here without any significant findings to explain the shortness of breath. Patient's room air saturations have been in the mid 90s throughout her stay here. Patient denies any shortness of breath. Also here patient without any anxiety symptoms. Patient's chest x-ray was negative basic labs were negative. No wheezing. Will discharge back to the nursing facility.

## 2013-05-18 ENCOUNTER — Encounter (HOSPITAL_COMMUNITY): Payer: Self-pay | Admitting: Emergency Medicine

## 2013-05-18 ENCOUNTER — Emergency Department (HOSPITAL_COMMUNITY): Payer: Medicare Other

## 2013-05-18 ENCOUNTER — Inpatient Hospital Stay (HOSPITAL_COMMUNITY)
Admission: EM | Admit: 2013-05-18 | Discharge: 2013-05-23 | DRG: 292 | Disposition: A | Discharge status: Skilled Nursing Facility | Payer: Medicare Other | Attending: Cardiology | Admitting: Cardiology

## 2013-05-18 DIAGNOSIS — Z9119 Patient's noncompliance with other medical treatment and regimen: Secondary | ICD-10-CM

## 2013-05-18 DIAGNOSIS — F411 Generalized anxiety disorder: Secondary | ICD-10-CM | POA: Diagnosis present

## 2013-05-18 DIAGNOSIS — Z8673 Personal history of transient ischemic attack (TIA), and cerebral infarction without residual deficits: Secondary | ICD-10-CM

## 2013-05-18 DIAGNOSIS — J449 Chronic obstructive pulmonary disease, unspecified: Secondary | ICD-10-CM | POA: Diagnosis present

## 2013-05-18 DIAGNOSIS — E785 Hyperlipidemia, unspecified: Secondary | ICD-10-CM | POA: Diagnosis present

## 2013-05-18 DIAGNOSIS — I2510 Atherosclerotic heart disease of native coronary artery without angina pectoris: Secondary | ICD-10-CM | POA: Diagnosis present

## 2013-05-18 DIAGNOSIS — I428 Other cardiomyopathies: Secondary | ICD-10-CM | POA: Diagnosis present

## 2013-05-18 DIAGNOSIS — E46 Unspecified protein-calorie malnutrition: Secondary | ICD-10-CM | POA: Diagnosis present

## 2013-05-18 DIAGNOSIS — Z853 Personal history of malignant neoplasm of breast: Secondary | ICD-10-CM

## 2013-05-18 DIAGNOSIS — Z87891 Personal history of nicotine dependence: Secondary | ICD-10-CM

## 2013-05-18 DIAGNOSIS — I5021 Acute systolic (congestive) heart failure: Secondary | ICD-10-CM | POA: Diagnosis present

## 2013-05-18 DIAGNOSIS — F209 Schizophrenia, unspecified: Secondary | ICD-10-CM | POA: Diagnosis present

## 2013-05-18 DIAGNOSIS — Z681 Body mass index (BMI) 19 or less, adult: Secondary | ICD-10-CM

## 2013-05-18 DIAGNOSIS — G4733 Obstructive sleep apnea (adult) (pediatric): Secondary | ICD-10-CM | POA: Diagnosis present

## 2013-05-18 DIAGNOSIS — I5020 Unspecified systolic (congestive) heart failure: Secondary | ICD-10-CM

## 2013-05-18 DIAGNOSIS — I252 Old myocardial infarction: Secondary | ICD-10-CM

## 2013-05-18 DIAGNOSIS — F3289 Other specified depressive episodes: Secondary | ICD-10-CM | POA: Diagnosis present

## 2013-05-18 DIAGNOSIS — F0390 Unspecified dementia without behavioral disturbance: Secondary | ICD-10-CM | POA: Diagnosis present

## 2013-05-18 DIAGNOSIS — Z803 Family history of malignant neoplasm of breast: Secondary | ICD-10-CM

## 2013-05-18 DIAGNOSIS — J4489 Other specified chronic obstructive pulmonary disease: Secondary | ICD-10-CM | POA: Diagnosis present

## 2013-05-18 DIAGNOSIS — I10 Essential (primary) hypertension: Secondary | ICD-10-CM | POA: Diagnosis present

## 2013-05-18 DIAGNOSIS — I498 Other specified cardiac arrhythmias: Secondary | ICD-10-CM | POA: Diagnosis present

## 2013-05-18 DIAGNOSIS — I509 Heart failure, unspecified: Secondary | ICD-10-CM | POA: Diagnosis present

## 2013-05-18 DIAGNOSIS — F329 Major depressive disorder, single episode, unspecified: Secondary | ICD-10-CM | POA: Diagnosis present

## 2013-05-18 DIAGNOSIS — Z91199 Patient's noncompliance with other medical treatment and regimen due to unspecified reason: Secondary | ICD-10-CM

## 2013-05-18 DIAGNOSIS — E7800 Pure hypercholesterolemia, unspecified: Secondary | ICD-10-CM | POA: Diagnosis present

## 2013-05-18 DIAGNOSIS — R634 Abnormal weight loss: Secondary | ICD-10-CM | POA: Diagnosis present

## 2013-05-18 LAB — CBC WITH DIFFERENTIAL/PLATELET
BASOS ABS: 0 10*3/uL (ref 0.0–0.1)
Basophils Relative: 1 % (ref 0–1)
Eosinophils Absolute: 0.1 10*3/uL (ref 0.0–0.7)
Eosinophils Relative: 1 % (ref 0–5)
HEMATOCRIT: 42.6 % (ref 36.0–46.0)
Hemoglobin: 14.4 g/dL (ref 12.0–15.0)
LYMPHS PCT: 24 % (ref 12–46)
Lymphs Abs: 0.8 10*3/uL (ref 0.7–4.0)
MCH: 32.7 pg (ref 26.0–34.0)
MCHC: 33.8 g/dL (ref 30.0–36.0)
MCV: 96.8 fL (ref 78.0–100.0)
MONO ABS: 0.5 10*3/uL (ref 0.1–1.0)
Monocytes Relative: 13 % — ABNORMAL HIGH (ref 3–12)
Neutro Abs: 2.1 10*3/uL (ref 1.7–7.7)
Neutrophils Relative %: 61 % (ref 43–77)
PLATELETS: 179 10*3/uL (ref 150–400)
RBC: 4.4 MIL/uL (ref 3.87–5.11)
RDW: 12.8 % (ref 11.5–15.5)
WBC: 3.5 10*3/uL — ABNORMAL LOW (ref 4.0–10.5)

## 2013-05-18 LAB — I-STAT TROPONIN, ED: Troponin i, poc: 0.2 ng/mL (ref 0.00–0.08)

## 2013-05-18 LAB — URINE MICROSCOPIC-ADD ON

## 2013-05-18 LAB — BASIC METABOLIC PANEL
BUN: 13 mg/dL (ref 6–23)
CHLORIDE: 100 meq/L (ref 96–112)
CO2: 19 meq/L (ref 19–32)
Calcium: 9.8 mg/dL (ref 8.4–10.5)
Creatinine, Ser: 1.24 mg/dL — ABNORMAL HIGH (ref 0.50–1.10)
GFR calc Af Amer: 51 mL/min — ABNORMAL LOW (ref >90)
GFR calc non Af Amer: 44 mL/min — ABNORMAL LOW (ref >90)
Glucose, Bld: 99 mg/dL (ref 70–99)
POTASSIUM: 4.4 meq/L (ref 3.7–5.3)
SODIUM: 140 meq/L (ref 137–147)

## 2013-05-18 LAB — URINALYSIS, ROUTINE W REFLEX MICROSCOPIC
Bilirubin Urine: NEGATIVE
Glucose, UA: NEGATIVE mg/dL
Hgb urine dipstick: NEGATIVE
KETONES UR: NEGATIVE mg/dL
NITRITE: NEGATIVE
Protein, ur: 100 mg/dL — AB
SPECIFIC GRAVITY, URINE: 1.017 (ref 1.005–1.030)
Urobilinogen, UA: 1 mg/dL (ref 0.0–1.0)
pH: 6 (ref 5.0–8.0)

## 2013-05-18 LAB — TROPONIN I: Troponin I: <0.3 ng/mL (ref <0.30)

## 2013-05-18 LAB — GLUCOSE, CAPILLARY: Glucose-Capillary: 105 mg/dL — ABNORMAL HIGH (ref 70–99)

## 2013-05-18 LAB — MRSA PCR SCREENING: MRSA BY PCR: NEGATIVE

## 2013-05-18 LAB — PRO B NATRIURETIC PEPTIDE: Pro B Natriuretic peptide (BNP): 6662 pg/mL — ABNORMAL HIGH (ref 0–125)

## 2013-05-18 MED ORDER — ADULT MULTIVITAMIN W/MINERALS CH
1.0000 | ORAL_TABLET | Freq: Every day | ORAL | Status: DC
  Administered 2013-05-19 – 2013-05-23 (×5): 1 via ORAL
  Filled 2013-05-18 (×6): qty 1

## 2013-05-18 MED ORDER — ALPRAZOLAM 0.25 MG PO TABS
0.2500 mg | ORAL_TABLET | Freq: Three times a day (TID) | ORAL | Status: DC | PRN
  Administered 2013-05-20: 0.25 mg via ORAL
  Filled 2013-05-18 (×2): qty 1

## 2013-05-18 MED ORDER — HEPARIN SODIUM (PORCINE) 5000 UNIT/ML IJ SOLN
5000.0000 [IU] | Freq: Three times a day (TID) | INTRAMUSCULAR | Status: DC
  Administered 2013-05-18 – 2013-05-23 (×14): 5000 [IU] via SUBCUTANEOUS
  Filled 2013-05-18 (×18): qty 1

## 2013-05-18 MED ORDER — STARCH (THICKENING) PO POWD
ORAL | Status: DC | PRN
  Filled 2013-05-18: qty 227

## 2013-05-18 MED ORDER — MEGESTROL ACETATE 40 MG/ML PO SUSP
200.0000 mg | Freq: Every day | ORAL | Status: DC
  Administered 2013-05-18 – 2013-05-23 (×6): 200 mg via ORAL
  Filled 2013-05-18 (×6): qty 5

## 2013-05-18 MED ORDER — ONDANSETRON HCL 4 MG/2ML IJ SOLN: 4.0000 mg | Freq: Four times a day (QID) | INTRAMUSCULAR | Status: DC | PRN

## 2013-05-18 MED ORDER — ACETAMINOPHEN 325 MG PO TABS
650.0000 mg | ORAL_TABLET | ORAL | Status: DC | PRN
  Administered 2013-05-18: 650 mg via ORAL
  Filled 2013-05-18: qty 2

## 2013-05-18 MED ORDER — ALBUTEROL SULFATE (2.5 MG/3ML) 0.083% IN NEBU
2.5000 mg | INHALATION_SOLUTION | Freq: Once | RESPIRATORY_TRACT | Status: AC
  Administered 2013-05-18: 2.5 mg via RESPIRATORY_TRACT
  Filled 2013-05-18: qty 3

## 2013-05-18 MED ORDER — QUETIAPINE FUMARATE 25 MG PO TABS
25.0000 mg | ORAL_TABLET | Freq: Two times a day (BID) | ORAL | Status: DC
  Administered 2013-05-18 – 2013-05-23 (×11): 25 mg via ORAL
  Filled 2013-05-18 (×12): qty 1

## 2013-05-18 MED ORDER — FUROSEMIDE 10 MG/ML IJ SOLN
40.0000 mg | Freq: Every day | INTRAMUSCULAR | Status: DC
  Administered 2013-05-18 – 2013-05-19 (×2): 40 mg via INTRAVENOUS
  Filled 2013-05-18 (×2): qty 4

## 2013-05-18 MED ORDER — SODIUM CHLORIDE 0.9 % IJ SOLN: 3.0000 mL | INTRAMUSCULAR | Status: DC | PRN

## 2013-05-18 MED ORDER — TIZANIDINE HCL 2 MG PO TABS
2.0000 mg | ORAL_TABLET | Freq: Three times a day (TID) | ORAL | Status: DC
  Administered 2013-05-18 – 2013-05-23 (×15): 2 mg via ORAL
  Filled 2013-05-18 (×17): qty 1

## 2013-05-18 MED ORDER — ALBUTEROL SULFATE (2.5 MG/3ML) 0.083% IN NEBU: 3.0000 mL | INHALATION_SOLUTION | Freq: Four times a day (QID) | RESPIRATORY_TRACT | Status: DC | PRN

## 2013-05-18 MED ORDER — LISINOPRIL 5 MG PO TABS
5.0000 mg | ORAL_TABLET | Freq: Every day | ORAL | Status: DC
  Administered 2013-05-19 – 2013-05-23 (×5): 5 mg via ORAL
  Filled 2013-05-18 (×6): qty 1

## 2013-05-18 MED ORDER — SODIUM CHLORIDE 0.9 % IV SOLN: 250.0000 mL | INTRAVENOUS | Status: DC | PRN

## 2013-05-18 MED ORDER — CITALOPRAM HYDROBROMIDE 10 MG PO TABS
10.0000 mg | ORAL_TABLET | Freq: Every day | ORAL | Status: DC
  Administered 2013-05-18 – 2013-05-23 (×6): 10 mg via ORAL
  Filled 2013-05-18 (×6): qty 1

## 2013-05-18 MED ORDER — ATORVASTATIN CALCIUM 10 MG PO TABS
10.0000 mg | ORAL_TABLET | Freq: Every day | ORAL | Status: DC
  Administered 2013-05-18 – 2013-05-22 (×5): 10 mg via ORAL
  Filled 2013-05-18 (×6): qty 1

## 2013-05-18 MED ORDER — SODIUM CHLORIDE 0.9 % IJ SOLN
3.0000 mL | Freq: Two times a day (BID) | INTRAMUSCULAR | Status: DC
  Administered 2013-05-18 – 2013-05-22 (×10): 3 mL via INTRAVENOUS

## 2013-05-18 MED ORDER — BUDESONIDE-FORMOTEROL FUMARATE 80-4.5 MCG/ACT IN AERO
2.0000 | INHALATION_SPRAY | Freq: Two times a day (BID) | RESPIRATORY_TRACT | Status: DC
  Administered 2013-05-18 – 2013-05-23 (×10): 2 via RESPIRATORY_TRACT
  Filled 2013-05-18: qty 6.9

## 2013-05-18 MED ORDER — CARVEDILOL 3.125 MG PO TABS
3.1250 mg | ORAL_TABLET | Freq: Two times a day (BID) | ORAL | Status: DC
  Administered 2013-05-18 – 2013-05-22 (×10): 3.125 mg via ORAL
  Filled 2013-05-18 (×13): qty 1

## 2013-05-18 MED ORDER — CARVEDILOL 3.125 MG PO TABS
3.1250 mg | ORAL_TABLET | Freq: Two times a day (BID) | ORAL | Status: DC
Start: 2013-05-18 — End: 2013-05-18

## 2013-05-18 MED ORDER — METHYLPREDNISOLONE SODIUM SUCC 125 MG IJ SOLR
125.0000 mg | Freq: Once | INTRAMUSCULAR | Status: AC
  Administered 2013-05-18: 125 mg via INTRAVENOUS
  Filled 2013-05-18: qty 2

## 2013-05-18 MED ORDER — NITROGLYCERIN 0.4 MG SL SUBL: 0.4000 mg | SUBLINGUAL_TABLET | SUBLINGUAL | Status: DC | PRN

## 2013-05-18 MED ORDER — FUROSEMIDE 10 MG/ML IJ SOLN
40.0000 mg | Freq: Once | INTRAMUSCULAR | Status: AC
  Administered 2013-05-18: 40 mg via INTRAVENOUS
  Filled 2013-05-18: qty 4

## 2013-05-18 NOTE — ED Provider Notes (Signed)
CSN: [id number]     Arrival date & time 05/18/13  5427 History   First MD Initiated Contact with Patient 05/18/13 [phone]     Chief Complaint  Patient presents with  . Shortness of Breath     (Consider location/radiation/quality/duration/timing/severity/associated sxs/prior Treatment) HPI Comments: Patient is a 68 year old female resident of [facility] presents to the emergency department by EMS with complaint of shortness of breath and difficulty with her breathing. The patient has a history of chronic obstructive pulmonary disease. She is supposed to be on oxygen in the facility, however upon EMS arrival she was not on the oxygen prescribed. The patient states that she noted this morning upon awaking that she had difficulty with her breathing. She states she attempted to use an albuterol nebulizer. Staff states that they are unsure if this actually took place or not. Review of the staff records is negative for recent temperature elevations. There is no recorded history of hemoptysis. The patient does not complaining of any chest pain at this time. There's been no known injury or trauma to the chest area.  Patient is a 68 y.o. female presenting with shortness of breath. The history is provided by the nursing home and the EMS personnel.  Shortness of Breath Associated symptoms: no abdominal pain, no chest pain, no cough, no fever, no neck pain and no wheezing     Past Medical History  Diagnosis Date  . Weight loss, unintentional   . Trouble swallowing   . Change in voice   . Substance abuse   . Dementia   . Asthma   . Shortness of breath   . Seizures     "    It has been along time "  . CHF (congestive heart failure)   . Headache(784.0)   . Hypertension   . Arthralgia   . Tremors of nervous system   . Anxiety   . Rib fractures     hx of May 2012  . Heart attack 05/21/10  . Schizophrenia   . Coronary artery disease   . Stroke 07/11/10    hx of R CVA   . COPD (chronic obstructive  pulmonary disease)   . Obstructive sleep apnea   . 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 Schizophrenia   . Depressive disorder    Past Surgical History  Procedure Laterality Date  . Total abdominal hysterectomy  age 15   Family History  Problem Relation Age of Onset  . Breast cancer Mother   . Birth defects Mother     breast  . Cancer Mother     breast  . Heart disease Father     heart attack  . Heart attack Father   . Heart attack Brother   . Cancer Brother     throat, lung  . Cancer Paternal Aunt   . Cancer Maternal Grandmother     breast    History  Substance Use Topics  . Smoking status: Former Smoker -- 1.00 packs/day for 50 years    Types: Cigarettes    Quit date: 02/22/2008  . Smokeless tobacco: Never Used     Comment: started smoking at age 30  . Alcohol Use: No     Comment: social use   OB History   Grav Para Term Preterm Abortions TAB SAB Ect Mult Living                 Review of  Systems  Constitutional: Negative for fever.       All ROS Neg except as noted in HPI  HENT: Negative for congestion and nosebleeds.   Eyes: Negative for photophobia and discharge.  Respiratory: Positive for shortness of breath. Negative for cough and wheezing.   Cardiovascular: Negative for chest pain and palpitations.  Gastrointestinal: Negative for abdominal pain and blood in stool.  Genitourinary: Negative for dysuria, frequency and hematuria.  Musculoskeletal: Negative for back pain and neck pain.  Skin: Negative.   Neurological: Negative for dizziness, seizures and speech difficulty.  Hematological: Does not bruise/bleed easily.  Psychiatric/Behavioral: Positive for confusion. Negative for hallucinations. The patient is nervous/anxious.       Allergies  Codeine; Penicillins; and Hydralazine  Home Medications   Current Outpatient Rx  Name  Route  Sig  Dispense  Refill  . albuterol (PROVENTIL) 90 MCG/ACT inhaler    Inhalation   Inhale 2 puffs into the lungs every 6 (six) hours as needed. For wheezing          . budesonide-formoterol (SYMBICORT) 80-4.5 MCG/ACT inhaler   Inhalation   Inhale 2 puffs into the lungs 2 (two) times daily.         . furosemide (LASIX) 40 MG tablet   Oral   Take 40 mg by mouth daily.         . Multiple Vitamin (MULTIVITAMIN WITH MINERALS) TABS tablet   Oral   Take 1 tablet by mouth daily.         . nitroGLYCERIN (NITROSTAT) 0.4 MG SL tablet   Sublingual   Place 1 tablet (0.4 mg total) under the tongue every 5 (five) minutes as needed. For chest pain   30 tablet   12    SpO2 98% Physical Exam  Nursing note and vitals reviewed. Constitutional: She appears well-developed and well-nourished.  Non-toxic appearance.  HENT:  Head: Normocephalic.  Right Ear: Tympanic membrane and external ear normal.  Left Ear: Tympanic membrane and external ear normal.  Eyes: EOM and lids are normal. Pupils are equal, round, and reactive to light.  Neck: Normal range of motion. Neck supple. Carotid bruit is not present.  Cardiovascular: Intact distal pulses and normal pulses.  Exam reveals no gallop and no friction rub.   Murmur heard. Occasional irreg beats.  Pulmonary/Chest: No respiratory distress. She has no wheezes.  Few scattered rales. Decreased breath sounds noted. No retractions.  Abdominal: Soft. Bowel sounds are normal. She exhibits no distension. There is no tenderness. There is no guarding.  Musculoskeletal:  Decrease ROM of the lower extremities, left more than right. No edema  Lymphadenopathy:       Head (right side): No submandibular adenopathy present.       Head (left side): No submandibular adenopathy present.    She has no cervical adenopathy.  Neurological: She is alert. No cranial nerve deficit or sensory deficit. She displays no seizure activity.  Garbled speech, not new for this patient.   Skin: Skin is warm and dry.  Psychiatric: Her mood appears  anxious. She is not agitated.    ED Course  Procedures (including critical care time) Labs Review Labs Reviewed - No data to display Imaging Review No results found.   EKG Interpretation None      MDM 68 year old female brought to the emergency department by EMS from local assisted living facility. Patient brought in because of complaint of shortness of breath. At this point it appears that the patient was not placed on  oxygen at the facility overnight. Upon awakening the patient felt short of breath and EMS was called to assess.  There is a history of COPD. There is a history of seizures but no reported seizure activity. There is a history of cardiac disease, but no reported chest pain. There is a history of dementia, the patient is at baseline according to the nursing facility. His been no reported high fevers.   Troponin elevated at 0.20. EKG reveals NSR with occasional APC's. No STEMI. Urinalysis reveals a turbid yellow specimen with a specific gravity 1.017. They're 100 mg decaliter of protein present. Small amount of leukocyte esterase. On the microscopic there are only 0-2 white cells, 0-2 red cells, and many bacteria present with hyaline casts being present. The troponin from the lab is within normal limits at less than 0.30. Complete blood count reveals the white blood cell count to be 3.5, otherwise within normal limits. The B. natruretic peptide is elevated at 6662. A portable chest reveals no consolidative pulmonary opacity. There is biapical emphysematous changes present but no other acute findings.  Patient seen with me by Dr. Roxanne Mins. Dr. can document came to the emergency department to evaluate the patient for admission. Patient is to be admitted for COPD with congestive heart failure.    Final diagnoses:  None    *I have reviewed nursing notes, vital signs, and all appropriate lab and imaging results for this patient.**    Lenox Ahr, PA-C 05/18/13 [address] [place] 05/18/13 1021

## 2013-05-18 NOTE — H&P (Signed)
[name] is an 68 y.o. female.   Chief Complaint: Shortness of breath HPI: 68 year old female resident of [facility], presents to the emergency department by EMS with complaint of shortness of breath. The patient has a history of chronic obstructive pulmonary disease. She is supposed to be on oxygen in the facility, however upon EMS arrival she was not on the oxygen prescribed. No fever. Some cough. No chest pain. Elevated BNP.    Past Medical History  Diagnosis Date  . Weight loss, unintentional   . Trouble swallowing   . Change in voice   . Substance abuse   . Dementia   . Asthma   . Shortness of breath   . Seizures     "    It has been along time "  . CHF (congestive heart failure)   . Headache(784.0)   . Hypertension   . Arthralgia   . Tremors of nervous system   . Anxiety   . Rib fractures     hx of May 2012  . Heart attack 05/21/10  . Schizophrenia   . Coronary artery disease   . Stroke 07/11/10    hx of R CVA   . COPD (chronic obstructive pulmonary disease)   . Obstructive sleep apnea   . 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 Schizophrenia   . Depressive disorder       Past Surgical History  Procedure Laterality Date  . Total abdominal hysterectomy  age 12    Family History  Problem Relation Age of Onset  . Breast cancer Mother   . Birth defects Mother     breast  . Cancer Mother     breast  . Heart disease Father     heart attack  . Heart attack Father   . Heart attack Brother   . Cancer Brother     throat, lung  . Cancer Paternal Aunt   . Cancer Maternal Grandmother     breast    Social History:  reports that she quit smoking about 5 years ago. Her smoking use included Cigarettes. She has a 50 pack-year smoking history. She has never used smokeless tobacco. She reports that she does not drink alcohol or use illicit drugs.  Allergies:  Allergies  Allergen Reactions  . Codeine Itching    All over the body   . Penicillins Hives and Other (See Comments)    "Whelps" per patient.  . Hydralazine     02/28/11 Family unsure if this is allergy for pt     (Not in a hospital admission)  Results for orders placed during the hospital encounter of 05/18/13 (from the past 48 hour(s))  PRO B NATRIURETIC PEPTIDE     Status: Abnormal   Collection Time    05/18/13  6:53 AM      Result Value Ref Range   Pro B Natriuretic peptide (BNP) 6662.0 (*) 0 - 125 pg/mL  TROPONIN I     Status: None   Collection Time    05/18/13  6:53 AM      Result Value Ref Range   Troponin I <0.30  <0.30 ng/mL   Comment:            Due to the release kinetics of cTnI,     a negative result within the first hours     of the onset of symptoms does not rule out     myocardial  infarction with certainty.     If myocardial infarction is still suspected,     repeat the test at appropriate intervals.  CBC WITH DIFFERENTIAL     Status: Abnormal   Collection Time    05/18/13  6:57 AM      Result Value Ref Range   WBC 3.5 (*) 4.0 - 10.5 K/uL   Comment: REPEATED TO VERIFY   RBC 4.40  3.87 - 5.11 MIL/uL   Hemoglobin 14.4  12.0 - 15.0 g/dL   HCT 42.6  36.0 - 46.0 %   MCV 96.8  78.0 - 100.0 fL   MCH 32.7  26.0 - 34.0 pg   MCHC 33.8  30.0 - 36.0 g/dL   RDW 12.8  11.5 - 15.5 %   Platelets 179  150 - 400 K/uL   Comment: REPEATED TO VERIFY   Neutrophils Relative % 61  43 - 77 %   Neutro Abs 2.1  1.7 - 7.7 K/uL   Lymphocytes Relative 24  12 - 46 %   Lymphs Abs 0.8  0.7 - 4.0 K/uL   Monocytes Relative 13 (*) 3 - 12 %   Monocytes Absolute 0.5  0.1 - 1.0 K/uL   Eosinophils Relative 1  0 - 5 %   Eosinophils Absolute 0.1  0.0 - 0.7 K/uL   Basophils Relative 1  0 - 1 %   Basophils Absolute 0.0  0.0 - 0.1 K/uL  BASIC METABOLIC PANEL     Status: Abnormal   Collection Time    05/18/13  6:57 AM      Result Value Ref Range   Sodium 140  137 - 147 mEq/L   Potassium 4.4  3.7 - 5.3 mEq/L   Chloride 100  96 - 112 mEq/L   CO2 19  19 - 32  mEq/L   Glucose, Bld 99  70 - 99 mg/dL   BUN 13  6 - 23 mg/dL   Creatinine, Ser 1.24 (*) 0.50 - 1.10 mg/dL   Calcium 9.8  8.4 - 10.5 mg/dL   GFR calc non Af Amer 44 (*) >90 mL/min   GFR calc Af Amer 51 (*) >90 mL/min   Comment: (NOTE)     The eGFR has been calculated using the CKD EPI equation.     This calculation has not been validated in all clinical situations.     eGFR's persistently <90 mL/min signify possible Chronic Kidney     Disease.  Randolm Idol, ED     Status: Abnormal   Collection Time    05/18/13  7:03 AM      Result Value Ref Range   Troponin i, poc 0.20 (*) 0.00 - 0.08 ng/mL   Comment NOTIFIED PHYSICIAN     Comment 3            Comment: Due to the release kinetics of cTnI,     a negative result within the first hours     of the onset of symptoms does not rule out     myocardial infarction with certainty.     If myocardial infarction is still suspected,     repeat the test at appropriate intervals.  URINALYSIS, ROUTINE W REFLEX MICROSCOPIC     Status: Abnormal   Collection Time    05/18/13  7:53 AM      Result Value Ref Range   Color, Urine YELLOW  YELLOW   APPearance TURBID (*) CLEAR   Specific Gravity, Urine 1.017  1.005 - 1.030  pH 6.0  5.0 - 8.0   Glucose, UA NEGATIVE  NEGATIVE mg/dL   Hgb urine dipstick NEGATIVE  NEGATIVE   Bilirubin Urine NEGATIVE  NEGATIVE   Ketones, ur NEGATIVE  NEGATIVE mg/dL   Protein, ur 100 (*) NEGATIVE mg/dL   Urobilinogen, UA 1.0  0.0 - 1.0 mg/dL   Nitrite NEGATIVE  NEGATIVE   Leukocytes, UA SMALL (*) NEGATIVE  TROPONIN I     Status: None   Collection Time    05/18/13  7:53 AM      Result Value Ref Range   Troponin I <0.30  <0.30 ng/mL   Comment:            Due to the release kinetics of cTnI,     a negative result within the first hours     of the onset of symptoms does not rule out     myocardial infarction with certainty.     If myocardial infarction is still suspected,     repeat the test at appropriate  intervals.  URINE MICROSCOPIC-ADD ON     Status: Abnormal   Collection Time    05/18/13  7:53 AM      Result Value Ref Range   Squamous Epithelial / LPF RARE  RARE   WBC, UA 0-2  <3 WBC/hpf   RBC / HPF 0-2  <3 RBC/hpf   Bacteria, UA MANY (*) RARE   Casts HYALINE CASTS (*) NEGATIVE   Urine-Other MUCOUS PRESENT     Dg Chest Portable 1 View  05/18/2013   CLINICAL DATA:  Shortness of breath  EXAM: PORTABLE CHEST - 1 VIEW  COMPARISON:  DG CHEST 2 VIEW dated 04/26/2013; DG CHEST 2 VIEW dated 12/15/2012  FINDINGS: Stable cardiomegaly. No consolidative pulmonary opacities. Biapical emphysematous change. No pleural effusion or pneumothorax. Regional skeleton demonstrates multiple left rib fractures.  IMPRESSION: Stable chest radiograph.   Electronically Signed   By: Lovey Newcomer M.D.   On: 05/18/2013 07:49    ROS Constitutional: Negative for fever.  HENT: Negative for congestion and nosebleeds.  Eyes: Negative for photophobia and discharge.  Respiratory: Positive for shortness of breath, cough and wheezing.  Cardiovascular: Negative for chest pain and palpitations.  Gastrointestinal: Negative for abdominal pain and blood in stool.  Genitourinary: Negative for dysuria, frequency and hematuria.  Musculoskeletal: Negative for back pain and neck pain.  Skin: Negative.  Neurological: Negative for dizziness, seizures and speech difficulty.  Hematological: Does not bruise/bleed easily.  Psychiatric/Behavioral: Positive for confusion and anxiety.    Blood pressure 136/86, pulse 60, temperature 98.3 F (36.8 C), temperature source Oral, resp. rate 18, SpO2 100.00%.  Physical Exam  Nursing note and vitals reviewed.  Constitutional: She appears averagely-developed and poorly-nourished. Non-toxic appearance.  HENT: Head: Normocephalic. Brown eyes: EOM and lids are normal. Pupils are equal, round, and reactive to light.  Neck: Normal range of motion. Neck supple. Carotid bruit is not present.   Cardiovascular: Intact distal pulses and normal pulses. Exam reveals no gallop and no friction rub.  II/VI systolic murmur. Occasional irreg beats.  Pulmonary/Chest: No respiratory distress. She has no wheezes. Few scattered rales. Decreased breath sounds noted. No retractions.  Abdominal: Soft. Bowel sounds are normal. She exhibits no distension. There is no tenderness. There is no guarding.  Musculoskeletal: Decrease ROM of the lower extremities, left more than right. No edema  Lymphadenopathy: She has no cervical adenopathy.  Neurological: She is alert. No cranial nerve deficit or sensory deficit. She displays no seizure  activity.  Garbled speech, not new for this patient.  Skin: Skin is warm and dry.  Psychiatric: She has a normal mood and affect.    Assessment/Plan Acute left heart systolic failure COPD S/P stroke Hypertension Hyperlipidemia CAD  Admit/IV lasix/Echocardiogram.   Dixie Dials S 05/18/2013, 10:03 AM

## 2013-05-18 NOTE — ED Notes (Signed)
IV team notified to for difficult IV stick. Will see

## 2013-05-18 NOTE — ED Notes (Signed)
Results of troponin given to Dr. Roxanne Mins

## 2013-05-18 NOTE — ED Notes (Signed)
Report given to 3E RN

## 2013-05-18 NOTE — ED Notes (Signed)
Patient lives in at [facility], woke up this morning with SOB, pt uses oxygen at home. When EMS arrived sat was 86% on RA and she was placed on 02 at 4L/[place] after about 5 mins, pt start feeling better. Pt denies any SOB or CP now. Patient has garbled speech and that is baseline for her. She has hx of COPD, dysarthria, HTN.

## 2013-05-18 NOTE — ED Notes (Signed)
Dr. Wilford Sports notified of pt's irregular rhythm- will see pt.

## 2013-05-18 NOTE — ED Provider Notes (Signed)
68 year old female developed dyspnea during the night. She denies chest pain she is a rather poor historian. On exam, lungs had markedly diminished air movement throughout. Heart has regular rate and rhythm. There is no peripheral edema. Initial point-of-care troponin has come back mildly elevated at 0.2 and will be checked by me and labs troponin.  Medical screening examination/treatment/procedure(s) were conducted as a shared visit with non-physician practitioner(s) and myself.  I personally evaluated the patient during the encounter.   Date: 05/18/2013  Rate: 73  Rhythm: normal sinus rhythm and premature atrial contractions (PAC)  QRS Axis: normal  Intervals: normal  ST/T Wave abnormalities: Repolarization changes secondary to LVH  Conduction Disutrbances:nonspecific intraventricular conduction delay  Narrative Interpretation: Occasional PAC, left ventricular hypertrophy with secondary repolarization changes, nonspecific intraventricular conduction delay. When compared with ECG of 04/27/2011, no significant changes are seen  Old EKG Reviewed: unchanged    Delora Fuel, MD 44/31/54 0086

## 2013-05-19 LAB — BASIC METABOLIC PANEL
BUN: 30 mg/dL — ABNORMAL HIGH (ref 6–23)
CALCIUM: 9.5 mg/dL (ref 8.4–10.5)
CO2: 20 mEq/L (ref 19–32)
Chloride: 101 mEq/L (ref 96–112)
Creatinine, Ser: 1.36 mg/dL — ABNORMAL HIGH (ref 0.50–1.10)
GFR, EST AFRICAN AMERICAN: 45 mL/min — AB (ref >90)
GFR, EST NON AFRICAN AMERICAN: 39 mL/min — AB (ref >90)
Glucose, Bld: 117 mg/dL — ABNORMAL HIGH (ref 70–99)
POTASSIUM: 4.2 meq/L (ref 3.7–5.3)
SODIUM: 139 meq/L (ref 137–147)

## 2013-05-19 MED ORDER — FUROSEMIDE 40 MG PO TABS
40.0000 mg | ORAL_TABLET | Freq: Every day | ORAL | Status: DC
  Administered 2013-05-20 – 2013-05-23 (×4): 40 mg via ORAL
  Filled 2013-05-19 (×4): qty 1

## 2013-05-19 MED ORDER — POTASSIUM CHLORIDE CRYS ER 20 MEQ PO TBCR
20.0000 meq | EXTENDED_RELEASE_TABLET | Freq: Once | ORAL | Status: AC
  Administered 2013-05-19: 20 meq via ORAL
  Filled 2013-05-19: qty 1

## 2013-05-19 NOTE — Progress Notes (Signed)
  Echocardiogram 2D Echocardiogram has been performed.  [name] 05/19/2013, 3:39 PM

## 2013-05-19 NOTE — Plan of Care (Signed)
Problem: Phase I Progression Outcomes Goal: Dyspnea controlled at rest (HF) Outcome: Progressing Client has oxygen via nasal cannula at 4L/minute

## 2013-05-19 NOTE — Progress Notes (Signed)
Continent.  Unable to tell us where she is or from where she came.

## 2013-05-19 NOTE — Progress Notes (Signed)
Subjective:  Feeling better. Afebrile.  Objective:  Vital Signs in the last 24 hours: Temp:  [97.5 F (36.4 C)-98.7 F (37.1 C)] 98.7 F (37.1 C) (03/29 0602) Pulse Rate:  [57-97] 68 (03/29 1017) Cardiac Rhythm:  [-] Normal sinus rhythm;Sinus bradycardia (03/28 2000) Resp:  [17-29] 17 (03/29 0602) BP: (91-154)/(60-95) 110/60 mmHg (03/29 1017) SpO2:  [97 %-100 %] 97 % (03/29 0602) Weight:  [39.7 kg (87 lb 8.4 oz)-40 kg (88 lb 2.9 oz)] 39.7 kg (87 lb 8.4 oz) (03/29 0602)  Physical Exam: BP Readings from Last 1 Encounters:  05/19/13 110/60     Wt Readings from Last 1 Encounters:  05/19/13 39.7 kg (87 lb 8.4 oz)    Weight change:   HEENT: [place]/AT, Eyes-Brown, PERL, EOMI, Conjunctiva-Pink, Sclera-Non-icteric Neck: + JVD, No bruit, Trachea midline. Lungs:  Clear, Bilateral. Cardiac:  Regular rhythm, normal S1 and S2, no S3. II/VI systolic murmur. Abdomen:  Soft, non-tender. Extremities:  No edema present. No cyanosis. No clubbing. CNS: AxOx3, Cranial nerves grossly intact, moves all 4 extremities. Right handed. Skin: Warm and dry.   Intake/Output from previous day: 03/28 0701 - 03/29 0700 In: 120 [P.O.:120] Out: 1325 [Urine:1325]    Lab Results: BMET    Component Value Date/Time   NA 139 05/19/2013 0720   K 4.2 05/19/2013 0720   CL 101 05/19/2013 0720   CO2 20 05/19/2013 0720   GLUCOSE 117* 05/19/2013 0720   BUN 30* 05/19/2013 0720   CREATININE 1.36* 05/19/2013 0720   CALCIUM 9.5 05/19/2013 0720   GFRNONAA 39* 05/19/2013 0720   GFRAA 45* 05/19/2013 0720   CBC    Component Value Date/Time   WBC 3.5* 05/18/2013 0657   WBC 5.3 03/01/2011 1601   RBC 4.40 05/18/2013 0657   RBC 4.28 03/01/2011 1601   HGB 14.4 05/18/2013 0657   HGB 13.3 03/01/2011 1601   HCT 42.6 05/18/2013 0657   HCT 40.1 03/01/2011 1601   PLT 179 05/18/2013 0657   PLT 194 03/01/2011 1601   MCV 96.8 05/18/2013 0657   MCV 93.8 03/01/2011 1601   MCH 32.7 05/18/2013 0657   MCH 31.0 03/01/2011 1601   MCHC 33.8 05/18/2013 0657    MCHC 33.0 03/01/2011 1601   RDW 12.8 05/18/2013 0657   RDW 14.5 03/01/2011 1601   LYMPHSABS 0.8 05/18/2013 0657   LYMPHSABS 0.9 03/01/2011 1601   MONOABS 0.5 05/18/2013 0657   MONOABS 0.3 03/01/2011 1601   EOSABS 0.1 05/18/2013 0657   EOSABS 0.1 03/01/2011 1601   BASOSABS 0.0 05/18/2013 0657   BASOSABS 0.0 03/01/2011 1601   CARDIAC ENZYMES Lab Results  Component Value Date   CKTOTAL 117 04/09/2011   CKMB 4.1* 04/09/2011   TROPONINI <0.30 05/18/2013    Scheduled Meds: . atorvastatin  10 mg Oral q1800  . budesonide-formoterol  2 puff Inhalation BID  . carvedilol  3.125 mg Oral BID WC  . citalopram  10 mg Oral Daily  . [START ON 05/20/2013] furosemide  40 mg Oral Daily  . heparin  5,000 Units Subcutaneous 3 times per day  . lisinopril  5 mg Oral Daily  . megestrol  200 mg Oral Daily  . multivitamin with minerals  1 tablet Oral Daily  . potassium chloride  20 mEq Oral Once  . QUEtiapine  25 mg Oral BID  . sodium chloride  3 mL Intravenous Q12H  . tiZANidine  2 mg Oral TID   Continuous Infusions:  PRN Meds:.sodium chloride, acetaminophen, albuterol, ALPRAZolam, food thickener, food thickener,  nitroGLYCERIN, ondansetron (ZOFRAN) IV, sodium chloride  Assessment/Plan: Acute left heart systolic failure  COPD  S/P stroke  Hypertension  Hyperlipidemia  CAD  Decrease lasix dose. Awaiting echocardogram   LOS: 1 day    Dixie Dials  MD  05/19/2013, 12:12 PM

## 2013-05-19 NOTE — Plan of Care (Signed)
Problem: Phase II Progression Outcomes Goal: Tolerating diet Outcome: Not Progressing Very poor appetite

## 2013-05-20 LAB — BASIC METABOLIC PANEL
BUN: 29 mg/dL — ABNORMAL HIGH (ref 6–23)
CHLORIDE: 102 meq/L (ref 96–112)
CO2: 27 mEq/L (ref 19–32)
Calcium: 9.1 mg/dL (ref 8.4–10.5)
Creatinine, Ser: 1.2 mg/dL — ABNORMAL HIGH (ref 0.50–1.10)
GFR calc Af Amer: 53 mL/min — ABNORMAL LOW (ref >90)
GFR, EST NON AFRICAN AMERICAN: 45 mL/min — AB (ref >90)
Glucose, Bld: 88 mg/dL (ref 70–99)
POTASSIUM: 4 meq/L (ref 3.7–5.3)
Sodium: 142 mEq/L (ref 137–147)

## 2013-05-20 MED ORDER — ENSURE PUDDING PO PUDG
1.0000 | Freq: Two times a day (BID) | ORAL | Status: DC
  Administered 2013-05-20 – 2013-05-23 (×7): 1 via ORAL

## 2013-05-20 NOTE — Progress Notes (Signed)
Subjective:  Patient denies any chest pain states breathing is improved  Objective:  Vital Signs in the last 24 hours: Temp:  [97.4 F (36.3 C)-98.2 F (36.8 C)] 98.2 F (36.8 C) (03/30 0543) Pulse Rate:  [49-79] 54 (03/30 0824) Resp:  [17-19] 17 (03/30 0543) BP: (99-135)/(61-84) 110/63 mmHg (03/30 0812) SpO2:  [99 %-100 %] 100 % (03/30 0543) Weight:  [38.8 kg (85 lb 8.6 oz)] 38.8 kg (85 lb 8.6 oz) (03/30 0543)  Intake/Output from previous day: 03/29 0701 - 03/30 0700 In: 540 [P.O.:540] Out: 1725 [Urine:1725] Intake/Output from this shift: Total I/O In: 3 [I.V.:3] Out: 200 [Urine:200]  Physical Exam: Neck: no adenopathy, no carotid bruit, no JVD and supple, symmetrical, trachea midline Lungs: Decreased breath sound at bases Heart: Regular rate and rhythm S1 and S2 soft there is soft S3 gallop and soft systolic murmur noted Abdomen: soft, non-tender; bowel sounds normal; no masses,  no organomegaly Extremities: extremities normal, atraumatic, no cyanosis or edema  Lab Results:  Recent Labs  05/18/13 0657  WBC 3.5*  HGB 14.4  PLT 179    Recent Labs  05/19/13 0720 05/20/13 0437  NA 139 142  K 4.2 4.0  CL 101 102  CO2 20 27  GLUCOSE 117* 88  BUN 30* 29*  CREATININE 1.36* 1.20*    Recent Labs  05/18/13 0653 05/18/13 0753  TROPONINI <0.30 <0.30   Hepatic Function Panel No results found for this basename: PROT, ALBUMIN, AST, ALT, ALKPHOS, BILITOT, BILIDIR, IBILI,  in the last 72 hours No results found for this basename: CHOL,  in the last 72 hours No results found for this basename: PROTIME,  in the last 72 hours  Imaging: Imaging results have been reviewed and No results found.  Cardiac Studies:  Assessment/Plan:  Resolving acute left systolic heart failure COPD History of CVA next hypertension Hypercholesteremia Nonischemic cardiomyopathy Plan Check labs in a.m. Increase ambulation   LOS: 2 days    Romy Ipock N 05/20/2013, 1:07  PM

## 2013-05-20 NOTE — Progress Notes (Signed)
INITIAL NUTRITION ASSESSMENT  DOCUMENTATION CODES Per approved criteria  -Underweight   INTERVENTION: Provide Ensure Pudding BID between meals Provide Magic Cup Ice Cream BID with meals Encourage PO intake  NUTRITION DIAGNOSIS: Inadequate oral intake related to poor appetite as evidenced by severe muscle wasting.   Goal: Pt to meet >/= 90% of their estimated nutrition needs   Monitor:  PO intake, weight trend, labs  Reason for Assessment: Malnutrition Screening Tool, score of 3  68 y.o. female  Admitting Dx: <principal problem not specified>  ASSESSMENT: 68 year old female presents to the emergency department by EMS with complaint of shortness of breath. Pt has a history of dementia, CHF, HTN, COPD, schizophrenia, and OSA.   Per nursing notes in chart, pt has a very poor appetite. Pt states her appetite is bad but, she is eating pretty good. Pt is very thin. Per nursing notes, pt has eaten 60% of some meals since admission. When asked about foods she likes, pt reports liking rice (with gravy). Per weight history, pt's weight has been fairly stable the past several months.   Nutrition Focused Physical Exam:  Subcutaneous Fat:  Orbital Region: mild wasting Upper Arm Region: mild wasting Thoracic and Lumbar Region: NA  Muscle:  Temple Region: mild wasting Clavicle Bone Region: mild wasting Clavicle and Acromion Bone Region: severe wasting Scapular Bone Region: NA Dorsal Hand: severe wasting Patellar Region: severe wasting Anterior Thigh Region: severe wasting Posterior Calf Region: severe wasting  Edema: none noted   Height: Ht Readings from Last 1 Encounters:  05/18/13 5' 1"  (1.549 m)    Weight: Wt Readings from Last 1 Encounters:  05/20/13 85 lb 8.6 oz (38.8 kg)    Ideal Body Weight: 105 lbs  % Ideal Body Weight: 81%  Wt Readings from Last 10 Encounters:  05/20/13 85 lb 8.6 oz (38.8 kg)  01/11/13 80 lb (36.288 kg)  12/18/12 82 lb 4.8 oz (37.331 kg)   12/12/12 87 lb 8.4 oz (39.7 kg)  11/24/12 88 lb 2.9 oz (40 kg)  06/24/12 87 lb 6.4 oz (39.644 kg)  10/19/11 97 lb 7.1 oz (44.2 kg)  10/10/11 97 lb 3.6 oz (44.1 kg)  04/13/11 100 lb 8.5 oz (45.6 kg)  04/06/11 102 lb (46.267 kg)    Usual Body Weight: 88 lbs  % Usual Body Weight: 97%  BMI:  Body mass index is 16.17 kg/(m^2).  Estimated Nutritional Needs: Kcal: 1350-1550 Protein: 55-65 grams Fluid: 1200 ml (fluid restriction per MD)  Skin: intact  Diet Order: Cardiac  EDUCATION NEEDS: -No education needs identified at this time   Intake/Output Summary (Last 24 hours) at 05/20/13 1022 Last data filed at 05/20/13 0430  Gross per 24 hour  Intake    300 ml  Output   1005 ml  Net   -705 ml    Last BM: 3/26  Labs:   Recent Labs Lab 05/18/13 0657 05/19/13 0720 05/20/13 0437  NA 140 139 142  K 4.4 4.2 4.0  CL 100 101 102  CO2 19 20 27   BUN 13 30* 29*  CREATININE 1.24* 1.36* 1.20*  CALCIUM 9.8 9.5 9.1  GLUCOSE 99 117* 88    CBG (last 3)   Recent Labs  05/18/13 1615  GLUCAP 105*    Scheduled Meds: . atorvastatin  10 mg Oral q1800  . budesonide-formoterol  2 puff Inhalation BID  . carvedilol  3.125 mg Oral BID WC  . citalopram  10 mg Oral Daily  . furosemide  40 mg Oral  Daily  . heparin  5,000 Units Subcutaneous 3 times per day  . lisinopril  5 mg Oral Daily  . megestrol  200 mg Oral Daily  . multivitamin with minerals  1 tablet Oral Daily  . QUEtiapine  25 mg Oral BID  . sodium chloride  3 mL Intravenous Q12H  . tiZANidine  2 mg Oral TID    Continuous Infusions:   Past Medical History  Diagnosis Date  . Weight loss, unintentional   . Trouble swallowing   . Change in voice   . Substance abuse   . Dementia   . Asthma   . Shortness of breath   . Seizures     "    It has been along time "  . CHF (congestive heart failure)   . Headache(784.0)   . Hypertension   . Arthralgia   . Tremors of nervous system   . Anxiety   . Rib fractures      hx of May 2012  . Heart attack 05/21/10  . Schizophrenia   . Coronary artery disease   . Stroke 07/11/10    hx of R CVA   . COPD (chronic obstructive pulmonary disease)   . Obstructive sleep apnea   . 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 Schizophrenia   . Depressive disorder     Past Surgical History  Procedure Laterality Date  . Total abdominal hysterectomy  age 79    [place], LDN Inpatient Clinical Dietitian Pager: [phone] After Hours Pager: [phone]

## 2013-05-20 NOTE — Evaluation (Signed)
Physical Therapy Evaluation Patient Details Name: [name] MRN: [id number] DOB: October 18, 1945 Today's Date: 05/20/2013   History of Present Illness  Pt admit with CHF.   Clinical Impression  Pt admitted with above. Pt currently with functional limitations due to balance and mobility deficits. Pt was at A living PTA but will need NHP on d/c for therapy as she needs mod asssist at present.   Pt will benefit from skilled PT to increase their independence and safety with mobility to allow discharge to the venue listed below.    Follow Up Recommendations SNF;Supervision/Assistance - 24 hour    Equipment Recommendations  Other (comment) (TBA)    Recommendations for Other Services       Precautions / Restrictions Precautions Precautions: Fall Restrictions Weight Bearing Restrictions: No      Mobility  Bed Mobility Overal bed mobility: Needs Assistance Bed Mobility: Supine to Sit     Supine to sit: Mod assist     General bed mobility comments: Pt with uncoordinated movements of trunk and LEs.  Difficulty getting to EOB needing cues and assist for sequencing movement. Pt agitated at times hitting at therapist to not touch her.    Transfers Overall transfer level: Needs assistance   Transfers: Sit to/from Stand Sit to Stand: Max assist         General transfer comment: Pt with LLE knee extension with standing and pt would not put the foot on the floor as well as strong posterior lean.  Pt could not stand uprigght without assist.    Ambulation/Gait                Stairs            Wheelchair Mobility    Modified Rankin (Stroke Patients Only)       Balance Overall balance assessment: Needs assistance;History of Falls Sitting-balance support: Bilateral upper extremity supported;Feet supported Sitting balance-Leahy Scale: Fair Sitting balance - Comments: leans posteriorly. Postural control: Posterior lean Standing balance support: Single extremity  supported;During functional activity Standing balance-Leahy Scale: Poor Standing balance comment: Pt cannot stand with right UE support as LLE extends and pt cannot stand upright.                       Pertinent Vitals/Pain VSS with sats on RA 92% and >; pt did have O2 on initially therefore replaced O2 and notified nursing. No pain    Home Living Family/patient expects to be discharged to:: Assisted living   Available Help at Discharge:  (reports niece who previously lived with her will live with her again) Type of Home: Apartment       Home Layout: One level Home Equipment: Bedside commode;Walker - 2 wheels;Cane - single point;Shower seat (1LO2 home)      Prior Function Level of Independence: Needs assistance   Gait / Transfers Assistance Needed: D/C from CIR primarily at w/c level.  Able to do only minimal ambulation.  ADL's / Homemaking Assistance Needed: min asssit        Hand Dominance   Dominant Hand: Right    Extremity/Trunk Assessment   Upper Extremity Assessment: Defer to OT evaluation           Lower Extremity Assessment: Generalized weakness      Cervical / Trunk Assessment: Kyphotic  Communication   Communication: Expressive difficulties  Cognition Arousal/Alertness: Awake/alert Behavior During Therapy: Agitated Overall Cognitive Status: Impaired/Different from baseline Area of Impairment: Following commands;Safety/judgement;Awareness;Problem solving  Memory: Decreased short-term memory Following Commands: Follows one step commands with increased time Safety/Judgement: Decreased awareness of safety;Decreased awareness of deficits   Problem Solving: Slow processing;Difficulty sequencing;Requires verbal cues;Decreased initiation      General Comments      Exercises        Assessment/Plan    PT Assessment Patient needs continued PT services  PT Diagnosis Generalized weakness   PT Problem List Decreased activity  tolerance;Decreased balance;Decreased knowledge of use of DME;Decreased safety awareness;Decreased knowledge of precautions  PT Treatment Interventions DME instruction;Gait training;Functional mobility training;Therapeutic activities;Therapeutic exercise;Balance training;Patient/family education   PT Goals (Current goals can be found in the Care Plan section) Acute Rehab PT Goals Patient Stated Goal: unable to assess PT Goal Formulation: With patient Time For Goal Achievement: 05/27/13 Potential to Achieve Goals: Good    Frequency Min 3X/week   Barriers to discharge Decreased caregiver support      End of Session Equipment Utilized During Treatment: Gait belt;Oxygen Activity Tolerance: Patient limited by fatigue Patient left: in bed;with call bell/phone within reach         Time: 0946-1007 PT Time Calculation (min): 21 min   Charges:   PT Evaluation $Initial PT Evaluation Tier I: 1 Procedure PT Treatments $Therapeutic Activity: 8-22 mins   PT G Codes:          INGOLD,Neasia Fleeman 06-11-13, 11:11 AM [facility] [phone] (pager)

## 2013-05-21 LAB — BASIC METABOLIC PANEL
BUN: 26 mg/dL — ABNORMAL HIGH (ref 6–23)
CO2: 20 meq/L (ref 19–32)
Calcium: 9.3 mg/dL (ref 8.4–10.5)
Chloride: 100 mEq/L (ref 96–112)
Creatinine, Ser: 1.06 mg/dL (ref 0.50–1.10)
GFR calc non Af Amer: 53 mL/min — ABNORMAL LOW (ref >90)
GFR, EST AFRICAN AMERICAN: 61 mL/min — AB (ref >90)
Glucose, Bld: 77 mg/dL (ref 70–99)
Potassium: 4.7 mEq/L (ref 3.7–5.3)
SODIUM: 137 meq/L (ref 137–147)

## 2013-05-21 LAB — CBC
HCT: 38.4 % (ref 36.0–46.0)
Hemoglobin: 12.6 g/dL (ref 12.0–15.0)
MCH: 32.4 pg (ref 26.0–34.0)
MCHC: 32.8 g/dL (ref 30.0–36.0)
MCV: 98.7 fL (ref 78.0–100.0)
Platelets: 190 10*3/uL (ref 150–400)
RBC: 3.89 MIL/uL (ref 3.87–5.11)
RDW: 12.8 % (ref 11.5–15.5)
WBC: 8.2 10*3/uL (ref 4.0–10.5)

## 2013-05-21 LAB — PRO B NATRIURETIC PEPTIDE: Pro B Natriuretic peptide (BNP): 733.9 pg/mL — ABNORMAL HIGH (ref 0–125)

## 2013-05-21 LAB — MAGNESIUM: Magnesium: 2.1 mg/dL (ref 1.5–2.5)

## 2013-05-21 NOTE — Progress Notes (Signed)
Earlier in shift for patient given chocolate ensure pudding per MAR. Note that patient ate entire pudding cup.  After morning meds given patient wanted to go back to sleep. Patient is resting now. No pain or discomfort noted. Will continue to monitor to end of shift.

## 2013-05-21 NOTE — Progress Notes (Signed)
Patient pulled up in the bed, repostitioned, and turned to the right side of bed. Medications given in applesauce along with Ensure chocolate pudding. Patient ate all of pudding cup and dranked about a quarter of a cup of water honey thickened. Patient states wants to go to sleep. Will continue to monitor patient to end of shift.

## 2013-05-21 NOTE — Plan of Care (Signed)
Problem: Phase I Progression Outcomes Goal: EF % per last Echo/documented,Core Reminder form on chart Outcome: Completed/Met Date Met:  05/21/13 EF - 15% on 05/19/2013     

## 2013-05-21 NOTE — Progress Notes (Signed)
Nurse tech notified nurse of low blood pressure of 87/50 with the dynomap machine. Nurse rechecked blood pressure manually -86/48. Patient states she feels fine when asked. Patient is alert and oriented times 2 and complains of no pain or discomfort. Will notify on-call doctor regarding this issue above and continue monitoring patient to end of shift.

## 2013-05-21 NOTE — Progress Notes (Signed)
Attending doctor returned paged. Orders given to recheck blood pressure in an hour and continue to monitor patient. Will carry out orders and continue to monitor patient to end of shift.

## 2013-05-21 NOTE — Progress Notes (Signed)
At beginning of shift, patient's two sisters were present and had concern of why she was not on a pureed diet but agreed to the fact that she should be on honey thick liquids. The sisters stated patient was on a pureed diet at the facility shes from. Will notify oncoming nurse to notify doctor and will also place a sticky note for doctor to address.

## 2013-05-21 NOTE — Progress Notes (Signed)
Subjective:  Patient denies any chest pain states breathing is improved. Denies any palpitations. Patient and nonsustained VT yesterday asymptomatic  Objective:  Vital Signs in the last 24 hours: Temp:  [97.7 F (36.5 C)-98.5 F (36.9 C)] 97.9 F (36.6 C) (03/31 0943) Pulse Rate:  [53-75] 59 (03/31 0943) Resp:  [18] 18 (03/31 0943) BP: (97-107)/(52-76) 101/74 mmHg (03/31 0943) SpO2:  [93 %-99 %] 98 % (03/31 0943) Weight:  [39.2 kg (86 lb 6.7 oz)] 39.2 kg (86 lb 6.7 oz) (03/31 0505)  Intake/Output from previous day: 03/30 0701 - 03/31 0700 In: 243 [P.O.:240; I.V.:3] Out: 700 [Urine:700] Intake/Output from this shift: Total I/O In: 120 [P.O.:120] Out: -   Physical Exam: Neck: no adenopathy, no carotid bruit, no JVD and supple, symmetrical, trachea midline Lungs: Decreased breath sound at bases Heart: regular rate and rhythm and S1 and S2 soft there is soft systolic murmur and S3 gallop Abdomen: soft, non-tender; bowel sounds normal; no masses,  no organomegaly Extremities: extremities normal, atraumatic, no cyanosis or edema  Lab Results:  Recent Labs  05/21/13 0540  WBC 8.2  HGB 12.6  PLT 190    Recent Labs  05/20/13 0437 05/21/13 0540  NA 142 137  K 4.0 4.7  CL 102 100  CO2 27 20  GLUCOSE 88 77  BUN 29* 26*  CREATININE 1.20* 1.06   No results found for this basename: TROPONINI, CK, MB,  in the last 72 hours Hepatic Function Panel No results found for this basename: PROT, ALBUMIN, AST, ALT, ALKPHOS, BILITOT, BILIDIR, IBILI,  in the last 72 hours No results found for this basename: CHOL,  in the last 72 hours No results found for this basename: PROTIME,  in the last 72 hours  Imaging: Imaging results have been reviewed and No results found.  Cardiac Studies:  Assessment/Plan:  Resolving acute left systolic heart failure  Status post nonsustained VT asymptomatic COPD  History of CVA  Hypertension Hypercholesteremia  Nonischemic cardiomyopathy   Protein calorie malnutrition Plan Continue present management Increase ambulation  LOS: 3 days    Matraca Hunkins N 05/21/2013, 11:15 AM

## 2013-05-22 NOTE — Discharge Instructions (Signed)
Heart Failure °Heart failure is a condition in which the heart has trouble pumping blood. This means your heart does not pump blood efficiently for your body to work well. In some cases of heart failure, fluid may back up into your lungs or you may have swelling (edema) in your lower legs. Heart failure is usually a long-term (chronic) condition. It is important for you to take good care of yourself and follow your caregiver's treatment plan. °CAUSES  °Some health conditions can cause heart failure. Those health conditions include: °· High blood pressure (hypertension) causes the heart muscle to work harder than normal. When pressure in the blood vessels is high, the heart needs to pump (contract) with more force in order to circulate blood throughout the body. High blood pressure eventually causes the heart to become stiff and weak. °· Coronary artery disease (CAD) is the buildup of cholesterol and fat (plaque) in the arteries of the heart. The blockage in the arteries deprives the heart muscle of oxygen and blood. This can cause chest pain and may lead to a heart attack. High blood pressure can also contribute to CAD. °· Heart attack (myocardial infarction) occurs when 1 or more arteries in the heart become blocked. The loss of oxygen damages the muscle tissue of the heart. When this happens, part of the heart muscle dies. The injured tissue does not contract as well and weakens the heart's ability to pump blood. °· Abnormal heart valves can cause heart failure when the heart valves do not open and close properly. This makes the heart muscle pump harder to keep the blood flowing. °· Heart muscle disease (cardiomyopathy or myocarditis) is damage to the heart muscle from a variety of causes. These can include drug or alcohol abuse, infections, or unknown reasons. These can increase the risk of heart failure. °· Lung disease makes the heart work harder because the lungs do not work properly. This can cause a strain  on the heart, leading it to fail. °· Diabetes increases the risk of heart failure. High blood sugar contributes to high fat (lipid) levels in the blood. Diabetes can also cause slow damage to tiny blood vessels that carry important nutrients to the heart muscle. When the heart does not get enough oxygen and food, it can cause the heart to become weak and stiff. This leads to a heart that does not contract efficiently. °· Other conditions can contribute to heart failure. These include abnormal heart rhythms, thyroid problems, and low blood counts (anemia). °Certain unhealthy behaviors can increase the risk of heart failure. Those unhealthy behaviors include: °· Being overweight. °· Smoking or chewing tobacco. °· Eating foods high in fat and cholesterol. °· Abusing illicit drugs or alcohol. °· Lacking physical activity. °SYMPTOMS  °Heart failure symptoms may vary and can be hard to detect. Symptoms may include: °· Shortness of breath with activity, such as climbing stairs. °· Persistent cough. °· Swelling of the feet, ankles, legs, or abdomen. °· Unexplained weight gain. °· Difficulty breathing when lying flat (orthopnea). °· Waking from sleep because of the need to sit up and get more air. °· Rapid heartbeat. °· Fatigue and loss of energy. °· Feeling lightheaded, dizzy, or close to fainting. °· Loss of appetite. °· Nausea. °· Increased urination during the night (nocturia). °DIAGNOSIS  °A diagnosis of heart failure is based on your history, symptoms, physical examination, and diagnostic tests. °Diagnostic tests for heart failure may include: °· Echocardiography. °· Electrocardiography. °· Chest X-ray. °· Blood tests. °· Exercise   stress test. °· Cardiac angiography. °· Radionuclide scans. °TREATMENT  °Treatment is aimed at managing the symptoms of heart failure. Medicines, behavioral changes, or surgical intervention may be necessary to treat heart failure. °· Medicines to help treat heart failure may  include: °· Angiotensin-converting enzyme (ACE) inhibitors. This type of medicine blocks the effects of a blood protein called angiotensin-converting enzyme. ACE inhibitors relax (dilate) the blood vessels and help lower blood pressure. °· Angiotensin receptor blockers. This type of medicine blocks the actions of a blood protein called angiotensin. Angiotensin receptor blockers dilate the blood vessels and help lower blood pressure. °· Water pills (diuretics). Diuretics cause the kidneys to remove salt and water from the blood. The extra fluid is removed through urination. This loss of extra fluid lowers the volume of blood the heart pumps. °· Beta blockers. These prevent the heart from beating too fast and improve heart muscle strength. °· Digitalis. This increases the force of the heartbeat. °· Healthy behavior changes include: °· Obtaining and maintaining a healthy weight. °· Stopping smoking or chewing tobacco. °· Eating heart healthy foods. °· Limiting or avoiding alcohol. °· Stopping illicit drug use. °· Physical activity as directed by your caregiver. °· Surgical treatment for heart failure may include: °· A procedure to open blocked arteries, repair damaged heart valves, or remove damaged heart muscle tissue. °· A pacemaker to improve heart muscle function and control certain abnormal heart rhythms. °· An internal cardioverter defibrillator to treat certain serious abnormal heart rhythms. °· A left ventricular assist device to assist the pumping ability of the heart. °HOME CARE INSTRUCTIONS  °· Take your medicine as directed by your caregiver. Medicines are important in reducing the workload of your heart, slowing the progression of heart failure, and improving your symptoms. °· Do not stop taking your medicine unless directed by your caregiver. °· Do not skip any dose of medicine. °· Refill your prescriptions before you run out of medicine. Your medicines are needed every day. °· Take over-the-counter  medicine only as directed by your caregiver or pharmacist. °· Engage in moderate physical activity if directed by your caregiver. Moderate physical activity can benefit some people. The elderly and people with severe heart failure should consult with a caregiver for physical activity recommendations. °· Eat heart healthy foods. Food choices should be free of trans fat and low in saturated fat, cholesterol, and salt (sodium). Healthy choices include fresh or frozen fruits and vegetables, fish, lean meats, legumes, fat-free or low-fat dairy products, and whole grain or high fiber foods. Talk to a dietitian to learn more about heart healthy foods. °· Limit sodium if directed by your caregiver. Sodium restriction may reduce symptoms of heart failure in some people. Talk to a dietitian to learn more about heart healthy seasonings. °· Use healthy cooking methods. Healthy cooking methods include roasting, grilling, broiling, baking, poaching, steaming, or stir-frying. Talk to a dietitian to learn more about healthy cooking methods. °· Limit fluids if directed by your caregiver. Fluid restriction may reduce symptoms of heart failure in some people. °· Weigh yourself every day. Daily weights are important in the early recognition of excess fluid. You should weigh yourself every morning after you urinate and before you eat breakfast. Wear the same amount of clothing each time you weigh yourself. Record your daily weight. Provide your caregiver with your weight record. °· Monitor and record your blood pressure if directed by your caregiver. °· Check your pulse if directed by your caregiver. °· Lose weight if directed   by your caregiver. Weight loss may reduce symptoms of heart failure in some people. °· Stop smoking or chewing tobacco. Nicotine makes your heart work harder by causing your blood vessels to constrict. Do not use nicotine gum or patches before talking to your caregiver. °· Schedule and attend follow-up visits as  directed by your caregiver. It is important to keep all your appointments. °· Limit alcohol intake to no more than 1 drink per day for nonpregnant women and 2 drinks per day for men. Drinking more than that is harmful to your heart. Tell your caregiver if you drink alcohol several times a week. Talk with your caregiver about whether alcohol is safe for you. If your heart has already been damaged by alcohol or you have severe heart failure, drinking alcohol should be stopped completely. °· Stop illicit drug use. °· Stay up-to-date with immunizations. It is especially important to prevent respiratory infections through current pneumococcal and influenza immunizations. °· Manage other health conditions such as hypertension, diabetes, thyroid disease, or abnormal heart rhythms as directed by your caregiver. °· Learn to manage stress. °· Plan rest periods when fatigued. °· Learn strategies to manage high temperatures. If the weather is extremely hot: °· Avoid vigorous physical activity. °· Use air conditioning or fans or seek a cooler location. °· Avoid caffeine and alcohol. °· Wear loose-fitting, lightweight, and light-colored clothing. °· Learn strategies to manage cold temperatures. If the weather is extremely cold: °· Avoid vigorous physical activity. °· Layer clothes. °· Wear mittens or gloves, a hat, and a scarf when going outside. °· Avoid alcohol. °· Obtain ongoing education and support as needed. °· Participate or seek rehabilitation as needed to maintain or improve independence and quality of life. °SEEK MEDICAL CARE IF:  °· Your weight increases by 03 lb/1.4 kg in 1 day or 05 lb/2.3 kg in a week. °· You have increasing shortness of breath that is unusual for you. °· You are unable to participate in your usual physical activities. °· You tire easily. °· You cough more than normal, especially with physical activity. °· You have any or more swelling in areas such as your hands, feet, ankles, or abdomen. °· You  are unable to sleep because it is hard to breathe. °· You feel like your heart is beating fast (palpitations). °· You become dizzy or lightheaded upon standing up. °SEEK IMMEDIATE MEDICAL CARE IF:  °· You have difficulty breathing. °· There is a change in mental status such as decreased alertness or difficulty with concentration. °· You have a pain or discomfort in your chest. °· You have an episode of fainting (syncope). °MAKE SURE YOU:  °· Understand these instructions. °· Will watch your condition. °· Will get help right away if you are not doing well or get worse. °Document Released: 02/07/2005 Document Revised: 06/04/2012 Document Reviewed: 03/01/2012 °Exit[employer]® Patient Information ©2014 [employer]. ° °

## 2013-05-22 NOTE — Discharge Summary (Signed)
  Discharge summary dictated on 05/22/2013 dictation is  [id number]

## 2013-05-22 NOTE — Clinical Social Work Placement (Addendum)
     Clinical Social Work Department CLINICAL SOCIAL WORK PLACEMENT NOTE 05/23/2013  Patient:  OVELLA, MANYGOATS  Account Number:  [id number] Admit date:  05/18/2013  Clinical Social Worker:  Butch Penny Somara Frymire, LCSWA  Date/time:  05/22/2013 10:00 AM  Clinical Social Work is seeking post-discharge placement for this patient at the following level of care:   [facility]   (*CSW will update this form in Epic as items are completed)   05/22/2013  Patient/family provided with [place] Department of Clinical Social Works list of facilities offering this level of care within the geographic area requested by the patient (or if unable, by the patients family).  05/22/2013  Patient/family informed of their freedom to choose among providers that offer the needed level of care, that participate in Medicare, Medicaid or managed care program needed by the patient, have an available bed and are willing to accept the patient.  05/22/2013  Patient/family informed of [facility] ownership interest in [facility], as well as of the fact that they are under no obligation to receive care at this facility.  PASARR submitted to [facility] on  PASARR number received from [place] on   FL2 transmitted to all facilities in geographic area requested by pt/family on  05/22/2013 FL2 transmitted to all facilities within larger geographic area on   Patient informed that his/her managed care company has contracts with or will negotiate with  certain facilities, including the following:   Has existing PASARR.     Patient/family informed of bed offers received:  05/22/2013 Patient chooses bed at [facility] Physician recommends and patient chooses bed at    Patient to be transferred to [facility] on  05/23/2013 Patient to be transferred to facility by Ambulance  [facility]y [facility])  The following physician request were entered in Epic:   Additional Comments: 05/22/13  CSW was able to  reach patient's neice and sister. Bed offers given and chose [facility] as patient has been a resident there about 6 months ago. They hope for her to receive rehab and then be able to return to [facility].  They are pleased with bed offer;  patient is alert but quite confused. Unable to participate in plan of care. Nursing notified to call report.  CSW signing off.  Lorie Phenix. [place], [place] 05/23/13  DC delayed last night as patient's heart rate dropped very low after EMS picked her up. They brought her back to her room. Ok per MD for d/c today. Sister- Tim Lair and nursing notified; facility is aware.  CSW signing off. Lorie Phenix. [place], [place]

## 2013-05-22 NOTE — Progress Notes (Signed)
Pt alert and forgetful, no c/o pain, pt stable

## 2013-05-22 NOTE — Discharge Summary (Signed)
[name], [name] NO.:  [id number]  MEDICAL RECORD NO.:  [id number]  LOCATION:  [id number]                        FACILITY:  [facility]  PHYSICIAN:  Allegra Lai. Terrence Dupont, M.D. DATE OF BIRTH:  1945-07-10  DATE OF ADMISSION:  05/18/2013 DATE OF DISCHARGE:  05/22/2013                              DISCHARGE SUMMARY   ADMITTING DIAGNOSES: 1. Acute left heart systolic failure. 2. Chronic obstructive pulmonary disease. 3. History of cerebrovascular accident. 4. Hypertension. 5. Hypercholesteremia.  DISCHARGE DIAGNOSES: 1. Compensated acute left systolic heart failure. 2. Status post nonsustained ventricular tachycardia, asymptomatic. 3. Chronic obstructive pulmonary disease. 4. Severe nonischemic dilated cardiomyopathy. 5. History of cardioembolic small non-Q-wave myocardial infarction in     the past, status post cardiogenic cerebrovascular accident in the     past. 6. Hypertension. 7. History of tobacco abuse. 8. Hypercholesteremia. 9. History of schizophrenia. 10.History of cancer of the breast. 11.History of polysubstance abuse in the past. 12.Protein calorie malnutrition.  DISCHARGE HOME MEDICATIONS: 1. Xanax 0.25 mg 3 times daily as needed for anxiety. 2. Lipitor 10 mg daily. 3. Symbicort 2 puffs twice daily. 4. Carvedilol 3.125 mg twice daily. 5. Citalopram 10 mg daily. 6. Lasix 40 mg 1 tablet daily. 7. Lisinopril 5 mg daily. 8. Megace 800 mg daily as before. 9. Multivitamin 1 tablet daily. 10.Nitrostat 0.4 mg daily. 11.Proventil inhaler 2 puffs every 6 hours. 12.Aspirin 325 mg 1 tablet daily. 13.Seroquel 25 mg twice daily. 14.Zanaflex 2 mg by mouth 3 times daily.  DIET:  Low salt, low cholesterol.  ACTIVITY:  Increase activity slowly as tolerated.  Heart failure instructions have been given.  Follow up with me in 1 week.  CONDITION AT DISCHARGE:  Stable.  BRIEF HISTORY AND HOSPITAL COURSE:  The patient is a 68 year old female with past medical  history significant for multiple medical problems, i.e., severe nonischemic dilated cardiomyopathy, history of cardioembolic small non-Q-wave myocardial infarction in the past, history of cardiogenic cerebrovascular accident  in the past, hypertension, chronic obstructive pulmonary disease, hypercholesteremia, history of tobacco abuse,  schizophrenia, history of cancer of breast, history of polysubstance abuse, noncompliant to follow up and medication, was admitted by Dr. Doylene Canard because of shortness of breath and was noted to be in decompensated heart failure.  The patient denies any cough, fever, chills.  Denies any chest pain.  PHYSICAL EXAMINATION:  GENERAL:  She was awake, alert. VITAL SIGNS:  Blood pressure was 136/86, pulse was 60.  She was afebrile.0 EYES:  Conjunctivae pink. NECK:  Supple.  No JVD.  No bruit. LUNGS:  Decreased breath sounds with scattered rales. CARDIOVASCULAR:  S1, S2 was soft.  There was 2/6 systolic murmur. ABDOMEN:  Soft.  Bowel sounds were present. EXTREMITIES:  There was no clubbing, cyanosis, or edema.  LABORATORY DATA:  Sodium was 140, potassium 4.4, BUN 13, creatinine 1.24.  ProBNP was 6662, repeat proBNP yesterday was 733, which is trending down.  Hemoglobin was 14.4, hematocrit 42.6, white count of 3.5.  Repeat electrolytes; sodium 137, potassium 4.7, BUN 26, creatinine 1.06.  Hemoglobin was 12.6, hematocrit 38.4, white count of 8.2.  BRIEF HOSPITAL COURSE:  The patient was admitted to telemetry unit.  MI was ruled out by  serial enzymes and EKG.  The patient did not have any episodes of chest pain during the hospital stay.  The patient was started on IV Lasix with improvement in her symptoms.  The patient was discharged home in October 2014 on Coumadin due to history of cardioembolic CVA, which was discontinued by PMD as outpatient, as the patient was noncompliant to medication and follow up and felt high risk of fall.  The patient will be  discharged home and will be followed up in my office in 1 week.     Allegra Lai. Terrence Dupont, M.D.     MNH/MEDQ  D:  05/22/2013  T:  05/22/2013  Job:  [id number]

## 2013-05-22 NOTE — Progress Notes (Signed)
CSW contacted by patient's nurse Keane Scrape that MD has d/c'd patient today.  CSW was not able to reach patient's family yesterday as they had a death in the family;  CSW spoke to sister today who was agreeable to SNF placement and SNF search initiated this morning.  Bed offers in place but unable to reach sister again. Messages left for return call. Awaiting completion of d/c summary which was dictated by MD. Hassan Rowan- Medical Records notified and will make the d/c summary priority.  CSW attempting to reach family to give bed offers and to hopefully arrange d/c this afternoon. If not- then d/c tomorrow.  Lorie Phenix. [place], [place]

## 2013-05-22 NOTE — Progress Notes (Signed)
Around midnight had patient to try to void and patient was not able to. Bladder scanned patient and the most result was 190cc. This morning patient is able to void and she voided 225cc. Will continue to monitor patient to end of shift.

## 2013-05-22 NOTE — Progress Notes (Signed)
[facility] here to take pt to [facility].  However, pt became bradycardic and her d/c was rescinded, per [facility] staff.  [facility] called and notified that pt would not be coming this pm.  Unit CSW to f/u in am.

## 2013-05-23 MED ORDER — MAGNESIUM HYDROXIDE 400 MG/5ML PO SUSP: 30.0000 mL | Freq: Every day | ORAL | Status: DC | PRN

## 2013-05-23 NOTE — Progress Notes (Signed)
EMS returned with pt d/t bradycardia, will re-evaluate in the am

## 2013-05-23 NOTE — Progress Notes (Signed)
Pt alert to self forgetful, pts HR 50-70's, pt asymptomatic, pt being dc to [facility], report called, pt leaving via ambulance, pt stable

## 2013-05-23 NOTE — Progress Notes (Signed)
Subjective:  Patient denies any chest pain or shortness of breath. No further episodes of marked sinus bradycardia.  Objective:  Vital Signs in the last 24 hours: Temp:  [97.4 F (36.3 C)-97.6 F (36.4 C)] 97.6 F (36.4 C) (04/02 1660) Pulse Rate:  [58-70] 70 (04/02 0927) Resp:  [16-20] 17 (04/02 0642) BP: (92-103)/(56-72) 92/71 mmHg (04/02 0927) SpO2:  [93 %-100 %] 93 % (04/02 0927) Weight:  [40.2 kg (88 lb 10 oz)] 40.2 kg (88 lb 10 oz) (04/02 0642)  Intake/Output from previous day: 04/01 0701 - 04/02 0700 In: 160 [P.O.:160] Out: 500 [Urine:500] Intake/Output from this shift: Total I/O In: -  Out: 100 [Urine:100]  Physical Exam: Neck: no adenopathy, no carotid bruit, no JVD and supple, symmetrical, trachea midline Lungs: Decreased breath sound at bases Heart: regular rate and rhythm and S1 and S2 soft there is soft systolic murmur no S3 gallop Abdomen: soft, non-tender; bowel sounds normal; no masses,  no organomegaly Extremities: extremities normal, atraumatic, no cyanosis or edema  Lab Results:  Recent Labs  05/21/13 0540  WBC 8.2  HGB 12.6  PLT 190    Recent Labs  05/21/13 0540  NA 137  K 4.7  CL 100  CO2 20  GLUCOSE 77  BUN 26*  CREATININE 1.06   No results found for this basename: TROPONINI, CK, MB,  in the last 72 hours Hepatic Function Panel No results found for this basename: PROT, ALBUMIN, AST, ALT, ALKPHOS, BILITOT, BILIDIR, IBILI,  in the last 72 hours No results found for this basename: CHOL,  in the last 72 hours No results found for this basename: PROTIME,  in the last 72 hours  Imaging: Imaging results have been reviewed and No results found.  Cardiac Studies:  Assessment/Plan:  Compensated acute left systolic heart failure  Status post marked sinus bradycardia secondary to meds COPD  History of CVA  Hypertension Hypercholesteremia  Nonischemic cardiomyopathy  Plan Okay to discharge to skilled nursing facility No changes in  discharge summary  LOS: 5 days    [name] N 05/23/2013, 9:51 AM

## 2013-05-23 NOTE — Clinical Social Work Psychosocial (Signed)
     Clinical Social Work Department BRIEF PSYCHOSOCIAL ASSESSMENT 05/23/2013  Patient:  [name], [name]     Account Number:  [id number]     Admit date:  05/18/2013  Clinical Social Worker:  Iona Coach  Date/Time:  05/22/2013 10:55 AM  Referred by:  Physician  Date Referred:  05/22/2013 Referred for  SNF Placement   Other Referral:   Interview type:  Other - See comment Other interview type:   Patient is alert but confused- speech is garbled due to old CVA    PSYCHOSOCIAL DATA Living Status:  FACILITY Admitted from facility:  [facility] Level of care:  Assisted Living Primary support name:  [name]  [phone] Primary support relationship to patient:  CHILD, ADULT Degree of support available:   Strong support    CURRENT CONCERNS Current Concerns  Post-Acute Placement   Other Concerns:   Increase in level of care for ALF to SNF.    SOCIAL WORK ASSESSMENT / PLAN 68 year old female- resident of [facility]. CSW attempted to talk with patient re: current living arrangement etc. She was noted to be alert but confused; her speech is severely garbled but she is able to answer yes and no at times. CSW spoke to her sister [name] who stated that her speech was affected after she had a stroke 1 year ago. Her sister is her next of kin and makes health care decisions.  Discussed PT's recommendation for short term SNF prior to retuning back to ALF- sister is in agreement and states that patient has been a resident of [facility] and would like patient to return there if possible.  Fl2 placed on chart for MD's signature;  bed search initiated. CSW notified Raquelle at [facility] to review patient.   Assessment/plan status:  Psychosocial Support/Ongoing Assessment of Needs Other assessment/ plan:   Information/referral to community resources:   SNF list left in patients' room for sister to review    PATIENTS/FAMILYS RESPONSE TO PLAN  OF CARE: Patient is alert and responsive but is disoriented and her speech is severely garbled. CSW was unable to talk with patient re: plan of care. She stated yes when asked if her neice or sister could be contacted.  Patient will return to ALF after SNF rehab. Sister- [name] is hoping patient can go to [facility]. CSW will monitor.

## 2013-05-23 NOTE — Progress Notes (Signed)
Patient currently resting. Has had no complaints of pain or discomfort during the night. Nurse signing off and report given to oncoming nurse.

## 2013-06-29 ENCOUNTER — Emergency Department (HOSPITAL_COMMUNITY): Payer: Medicare Other

## 2013-06-29 ENCOUNTER — Emergency Department (HOSPITAL_COMMUNITY)
Admission: EM | Admit: 2013-06-29 | Discharge: 2013-06-29 | Disposition: A | Discharge status: Home / Self Care | Payer: Medicare Other | Attending: Emergency Medicine | Admitting: Emergency Medicine

## 2013-06-29 ENCOUNTER — Encounter (HOSPITAL_COMMUNITY): Payer: Self-pay | Admitting: Emergency Medicine

## 2013-06-29 DIAGNOSIS — I252 Old myocardial infarction: Secondary | ICD-10-CM | POA: Insufficient documentation

## 2013-06-29 DIAGNOSIS — J4489 Other specified chronic obstructive pulmonary disease: Secondary | ICD-10-CM | POA: Insufficient documentation

## 2013-06-29 DIAGNOSIS — Z87891 Personal history of nicotine dependence: Secondary | ICD-10-CM | POA: Insufficient documentation

## 2013-06-29 DIAGNOSIS — I10 Essential (primary) hypertension: Secondary | ICD-10-CM | POA: Insufficient documentation

## 2013-06-29 DIAGNOSIS — Z8781 Personal history of (healed) traumatic fracture: Secondary | ICD-10-CM | POA: Insufficient documentation

## 2013-06-29 DIAGNOSIS — W19XXXA Unspecified fall, initial encounter: Secondary | ICD-10-CM

## 2013-06-29 DIAGNOSIS — I509 Heart failure, unspecified: Secondary | ICD-10-CM | POA: Insufficient documentation

## 2013-06-29 DIAGNOSIS — J449 Chronic obstructive pulmonary disease, unspecified: Secondary | ICD-10-CM | POA: Insufficient documentation

## 2013-06-29 DIAGNOSIS — Y9210 Unspecified residential institution as the place of occurrence of the external cause: Secondary | ICD-10-CM | POA: Insufficient documentation

## 2013-06-29 DIAGNOSIS — Z8673 Personal history of transient ischemic attack (TIA), and cerebral infarction without residual deficits: Secondary | ICD-10-CM | POA: Insufficient documentation

## 2013-06-29 DIAGNOSIS — G40909 Epilepsy, unspecified, not intractable, without status epilepticus: Secondary | ICD-10-CM | POA: Insufficient documentation

## 2013-06-29 DIAGNOSIS — Z853 Personal history of malignant neoplasm of breast: Secondary | ICD-10-CM | POA: Insufficient documentation

## 2013-06-29 DIAGNOSIS — I2510 Atherosclerotic heart disease of native coronary artery without angina pectoris: Secondary | ICD-10-CM | POA: Insufficient documentation

## 2013-06-29 DIAGNOSIS — R296 Repeated falls: Secondary | ICD-10-CM | POA: Insufficient documentation

## 2013-06-29 DIAGNOSIS — F3289 Other specified depressive episodes: Secondary | ICD-10-CM | POA: Insufficient documentation

## 2013-06-29 DIAGNOSIS — F0390 Unspecified dementia without behavioral disturbance: Secondary | ICD-10-CM | POA: Insufficient documentation

## 2013-06-29 DIAGNOSIS — Z880 Allergy status to penicillin: Secondary | ICD-10-CM | POA: Insufficient documentation

## 2013-06-29 DIAGNOSIS — F329 Major depressive disorder, single episode, unspecified: Secondary | ICD-10-CM | POA: Insufficient documentation

## 2013-06-29 DIAGNOSIS — IMO0002 Reserved for concepts with insufficient information to code with codable children: Secondary | ICD-10-CM | POA: Insufficient documentation

## 2013-06-29 DIAGNOSIS — Z90710 Acquired absence of both cervix and uterus: Secondary | ICD-10-CM | POA: Insufficient documentation

## 2013-06-29 DIAGNOSIS — Z79899 Other long term (current) drug therapy: Secondary | ICD-10-CM | POA: Insufficient documentation

## 2013-06-29 DIAGNOSIS — G4733 Obstructive sleep apnea (adult) (pediatric): Secondary | ICD-10-CM | POA: Insufficient documentation

## 2013-06-29 DIAGNOSIS — I69959 Hemiplegia and hemiparesis following unspecified cerebrovascular disease affecting unspecified side: Secondary | ICD-10-CM | POA: Insufficient documentation

## 2013-06-29 DIAGNOSIS — S3981XA Other specified injuries of abdomen, initial encounter: Secondary | ICD-10-CM | POA: Insufficient documentation

## 2013-06-29 DIAGNOSIS — F411 Generalized anxiety disorder: Secondary | ICD-10-CM | POA: Insufficient documentation

## 2013-06-29 DIAGNOSIS — F209 Schizophrenia, unspecified: Secondary | ICD-10-CM | POA: Insufficient documentation

## 2013-06-29 DIAGNOSIS — Y9389 Activity, other specified: Secondary | ICD-10-CM | POA: Insufficient documentation

## 2013-06-29 NOTE — ED Provider Notes (Signed)
CSN: [id number]     Arrival date & time 06/29/13  1129 History   First MD Initiated Contact with Patient 06/29/13 1142     Chief Complaint  Patient presents with  . Fall   Level 5 caveat   (Consider location/radiation/quality/duration/timing/severity/associated sxs/prior Treatment) HPI [name] is a(n) 68 y.o. female who presents via EMS. History is difficult as patient is unable to provide hx. She has a very complicated past medical history which includes dementia, substance abuse, CHF, recent strokes within the past year, coronary artery and peripheral arterial disease, schizophrenia. The patient is at physical therapy today. She is unable to bear weight on her legs after her stroke and has residual left-sided hemiparesis and dysphasia. According to EMS patient is attending physical therapy today. The patient unable to bear weight on her legs. She did not bleed further help and decided to try and go to the restroom on her own at which point she fell onto her bottom. Patient complained of pain in her sacrum and lower back. She did not hit her head or lose consciousness. Past Medical History  Diagnosis Date  . Weight loss, unintentional   . Trouble swallowing   . Change in voice   . Substance abuse   . Dementia   . Asthma   . Shortness of breath   . Seizures     "    It has been along time "  . CHF (congestive heart failure)   . Headache(784.0)   . Hypertension   . Arthralgia   . Tremors of nervous system   . Anxiety   . Rib fractures     hx of May 2012  . Heart attack 05/21/10  . Schizophrenia   . Coronary artery disease   . Stroke 07/11/10    hx of R CVA   . COPD (chronic obstructive pulmonary disease)   . Obstructive sleep apnea   . 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 Schizophrenia   . Depressive disorder    Past Surgical History  Procedure Laterality Date  . Total abdominal hysterectomy  age 49   Family History  Problem  Relation Age of Onset  . Breast cancer Mother   . Birth defects Mother     breast  . Cancer Mother     breast  . Heart disease Father     heart attack  . Heart attack Father   . Heart attack Brother   . Cancer Brother     throat, lung  . Cancer Paternal Aunt   . Cancer Maternal Grandmother     breast    History  Substance Use Topics  . Smoking status: Former Smoker -- 1.00 packs/day for 50 years    Types: Cigarettes    Quit date: 02/22/2008  . Smokeless tobacco: Never Used     Comment: started smoking at age 11  . Alcohol Use: No     Comment: social use   OB History   Grav Para Term Preterm Abortions TAB SAB Ect Mult Living                 Review of Systems  Ten systems reviewed and are negative for acute change, except as noted in the HPI.     Allergies  Codeine; Penicillins; and Hydralazine  Home Medications   Prior to Admission medications   Medication Sig Start Date End Date Taking? Authorizing Provider  atorvastatin (LIPITOR)  10 MG tablet Take 10 mg by mouth daily.   Yes Historical Provider, MD  budesonide-formoterol (SYMBICORT) 80-4.5 MCG/ACT inhaler Inhale 2 puffs into the lungs 2 (two) times daily.   Yes Historical Provider, MD  carvedilol (COREG) 3.125 MG tablet Take 3.125 mg by mouth 2 (two) times daily with a meal.   Yes Historical Provider, MD  citalopram (CELEXA) 10 MG tablet Take 10 mg by mouth daily.   Yes Historical Provider, MD  furosemide (LASIX) 40 MG tablet Take 40 mg by mouth daily.   Yes Historical Provider, MD  lisinopril (PRINIVIL,ZESTRIL) 5 MG tablet Take 5 mg by mouth daily.   Yes Historical Provider, MD  Melatonin 5 MG TABS Take 5 mg by mouth at bedtime.   Yes Historical Provider, MD  Multiple Vitamin (MULTIVITAMIN WITH MINERALS) TABS tablet Take 1 tablet by mouth daily. 12/12/12  Yes Daniel J Angiulli, PA-C  QUEtiapine (SEROQUEL) 25 MG tablet Take 25 mg by mouth 2 (two) times daily.   Yes Historical Provider, MD  albuterol (PROVENTIL)  90 MCG/ACT inhaler Inhale 2 puffs into the lungs every 6 (six) hours as needed. For wheezing     Historical Provider, MD  ALPRAZolam (XANAX) 0.25 MG tablet Take 0.25 mg by mouth 3 (three) times daily as needed for anxiety.    Historical Provider, MD  food thickener (THICK-IT #2) POWD Take by mouth as needed (to make liquids nectar thick).    Historical Provider, MD  megestrol (MEGACE) 40 MG/ML suspension Take 800 mg by mouth daily.    Historical Provider, MD  nitroGLYCERIN (NITROSTAT) 0.4 MG SL tablet Place 1 tablet (0.4 mg total) under the tongue every 5 (five) minutes as needed. For chest pain 12/12/12   Lavon Paganini Angiulli, PA-C  tiZANidine (ZANAFLEX) 2 MG tablet Take 2 mg by mouth 3 (three) times daily.    Historical Provider, MD   BP 113/82  Pulse 63  Temp(Src) 97.3 F (36.3 C) (Axillary)  Resp 18  SpO2 98% Physical Exam Nursing note and vitals reviewed. Constitutional: Thin, chronically ill appearing female in NAD HENT:  Head: Normocephalic and atraumatic.  Eyes: Conjunctivae normal and EOM are normal. Pupils are equal, round, and reactive to light. No scleral icterus.  Neck: Normal range of motion.  Cardiovascular: Normal rate, regular rhythm and normal heart sounds.  Exam reveals no gallop and no friction rub.   No murmur heard. Pulmonary/Chest: Effort normal and breath sounds normal. No respiratory distress.  Abdominal: Soft. Bowel sounds are normal. She exhibits no distension and no mass. There is no tenderness. There is no guarding.  Musculoskeletal: No bony tenderness over the sacrum or lumbar spine. Neurological: She is alert. Speech is difficult to understand. Left sided hemiparesis. Skin: Skin is warm and dry. She is not diaphoretic.    ED Course  Procedures (including critical care time) Labs Review Labs Reviewed - No data to display  Imaging Review No results found.   EKG Interpretation None      MDM   Final diagnoses:  Fall    Patient seen in shared  visit with Dr. Tomi Bamberger. No fractures seen on imaging. She has no bony tenderness on PE. She appears to be at her baseline mental status and no concern for repeat stroke, no new focal neuro deficits, no concern for syncope. Patient is asking to go home.  The patient appears reasonably screened and/or stabilized for discharge and I doubt any other medical condition or other [facility] requiring further screening, evaluation, or treatment in  the ED at this time prior to discharge.      Margarita Mail, PA-C 07/02/13 [phone]

## 2013-06-29 NOTE — Discharge Instructions (Signed)
Fall Prevention and Home Safety Falls cause injuries and can affect all age groups. It is possible to use preventive measures to significantly decrease the likelihood of falls. There are many simple measures which can make your home safer and prevent falls. OUTDOORS  Repair cracks and edges of walkways and driveways.  Remove high doorway thresholds.  Trim shrubbery on the main path into your home.  Have good outside lighting.  Clear walkways of tools, rocks, debris, and clutter.  Check that handrails are not broken and are securely fastened. Both sides of steps should have handrails.  Have leaves, snow, and ice cleared regularly.  Use sand or salt on walkways during winter months.  In the garage, clean up grease or oil spills. BATHROOM  Install night lights.  Install grab bars by the toilet and in the tub and shower.  Use non-skid mats or decals in the tub or shower.  Place a plastic non-slip stool in the shower to sit on, if needed.  Keep floors dry and clean up all water on the floor immediately.  Remove soap buildup in the tub or shower on a regular basis.  Secure bath mats with non-slip, double-sided rug tape.  Remove throw rugs and tripping hazards from the floors. BEDROOMS  Install night lights.  Make sure a bedside light is easy to reach.  Do not use oversized bedding.  Keep a telephone by your bedside.  Have a firm chair with side arms to use for getting dressed.  Remove throw rugs and tripping hazards from the floor. KITCHEN  Keep handles on pots and pans turned toward the center of the stove. Use back burners when possible.  Clean up spills quickly and allow time for drying.  Avoid walking on wet floors.  Avoid hot utensils and knives.  Position shelves so they are not too high or low.  Place commonly used objects within easy reach.  If necessary, use a sturdy step stool with a grab bar when reaching.  Keep electrical cables out of the  way.  Do not use floor polish or wax that makes floors slippery. If you must use wax, use non-skid floor wax.  Remove throw rugs and tripping hazards from the floor. STAIRWAYS  Never leave objects on stairs.  Place handrails on both sides of stairways and use them. Fix any loose handrails. Make sure handrails on both sides of the stairways are as long as the stairs.  Check carpeting to make sure it is firmly attached along stairs. Make repairs to worn or loose carpet promptly.  Avoid placing throw rugs at the top or bottom of stairways, or properly secure the rug with carpet tape to prevent slippage. Get rid of throw rugs, if possible.  Have an electrician put in a light switch at the top and bottom of the stairs. OTHER FALL PREVENTION TIPS  Wear low-heel or rubber-soled shoes that are supportive and fit well. Wear closed toe shoes.  When using a stepladder, make sure it is fully opened and both spreaders are firmly locked. Do not climb a closed stepladder.  Add color or contrast paint or tape to grab bars and handrails in your home. Place contrasting color strips on first and last steps.  Learn and use mobility aids as needed. Install an electrical emergency response system.  Turn on lights to avoid dark areas. Replace light bulbs that burn out immediately. Get light switches that glow.  Arrange furniture to create clear pathways. Keep furniture in the same place.  Firmly attach carpet with non-skid or double-sided tape.  Eliminate uneven floor surfaces.  Select a carpet pattern that does not visually hide the edge of steps.  Be aware of all pets. OTHER HOME SAFETY TIPS  Set the water temperature for 120 F (48.8 C).  Keep emergency numbers on or near the telephone.  Keep smoke detectors on every level of the home and near sleeping areas. Document Released: 01/28/2002 Document Revised: 08/09/2011 Document Reviewed: 04/29/2011 [facility] Patient Information 2014  [place].  Fall Prevention in Hospitals As a hospital patient, your condition and the treatments you receive can increase your risk for falls. Some additional risk factors for falls in a hospital include:  Being in an unfamiliar environment.  Being on bed rest.  Your surgery.  Taking certain medicines.  Your tubing requirements, such as intravenous (IV) therapy or catheters. It is important that you learn how to decrease fall risks while at the hospital. Below are important tips that can help prevent falls. SAFETY TIPS FOR PREVENTING FALLS Talk about your risk of falling.  Ask your caregiver why you are at risk for falling. Is it your medicine, illness, tubing placement, or something else?  Make a plan with your caregiver to keep you safe from falls.  Ask your caregiver or pharmacist about side effect of your medicines. Some medicines can make you dizzy or affect your coordination. Ask for help.  Ask for help before getting out of bed. You may need to press your call button.  Ask for assistance in getting you safely to the toilet.  Ask for a walker or cane to be put at your bedside. Ask that most of the side rails on your bed be placed up before your caregiver leaves the room.  Ask family or friends to sit with you.  Ask for things that are out of your reach, such as your glasses, hearing aids, telephone, bedside table, or call button. Follow these tips to avoid falling:  Stay lying or seated, rather than standing, while waiting for help.  Wear rubber-soled slippers or shoes whenever you walk in the hospital.  Avoid quick, sudden movements.  Change positions slowly.  Sit on the side of your bed before standing.  Stand up slowly and wait before you start to walk.  Let your caregiver know if there is a spill on the floor.  Pay careful attention to the medical equipment, electrical cords, and tubes around you.  When you need help, use your call button by your bed  or in the bathroom. Wait for one of your caregivers to help you.  If you feel dizzy or unsure of your footing, return to bed and wait for assistance.  Avoid being distracted by the TV, telephone, or another person in your room.  Do not lean or support yourself on rolling objects, such as IV poles or bedside tables. Document Released: 02/05/2000 Document Revised: 01/25/2012 Document Reviewed: 10/16/2011 [facility] Patient Information 2014 [place], [place].

## 2013-06-29 NOTE — ED Provider Notes (Signed)
Pt had a fall today at her ALF when she fell in a sitting position. When I ask her what hurts she puts her hands on her lower abdomen and states "I have to pee".  Pt otherwise has no complaints.   Pt is thin and frail, abdomen is only tender over her bladder. She is moving her arms well.   Medical screening examination/treatment/procedure(s) were conducted as a shared visit with non-physician practitioner(s) and myself.  I personally evaluated the patient during the encounter.   EKG Interpretation None       Rolland Porter, MD, Abram Sander   Janice Norrie, MD 06/29/13 [phone]

## 2013-06-29 NOTE — ED Notes (Signed)
Per ems, pt from [facility]. Per ems, nurse states pt is in physical therapy to due to an injury that makes pt unable to bear weight on legs. Pt was attempting to use the restroom and did not wait for help, fell onto buttocks. Pt denies neck or head pain, denies hitting head or LOC. Pt only complain is lower back and buttocks pain. Pt in NAD, AAOX4. VSS.

## 2013-07-02 NOTE — ED Provider Notes (Signed)
See prior note   Janice Norrie, MD 07/02/13 1501

## 2013-08-06 ENCOUNTER — Emergency Department (HOSPITAL_COMMUNITY): Payer: Medicare Other

## 2013-08-06 ENCOUNTER — Encounter (HOSPITAL_COMMUNITY): Payer: Self-pay | Admitting: Emergency Medicine

## 2013-08-06 ENCOUNTER — Emergency Department (HOSPITAL_COMMUNITY)
Admission: EM | Admit: 2013-08-06 | Discharge: 2013-08-07 | Disposition: A | Discharge status: Home / Self Care | Payer: Medicare Other | Attending: Emergency Medicine | Admitting: Emergency Medicine

## 2013-08-06 DIAGNOSIS — Z880 Allergy status to penicillin: Secondary | ICD-10-CM | POA: Insufficient documentation

## 2013-08-06 DIAGNOSIS — Z8673 Personal history of transient ischemic attack (TIA), and cerebral infarction without residual deficits: Secondary | ICD-10-CM | POA: Diagnosis not present

## 2013-08-06 DIAGNOSIS — I2510 Atherosclerotic heart disease of native coronary artery without angina pectoris: Secondary | ICD-10-CM | POA: Diagnosis not present

## 2013-08-06 DIAGNOSIS — R0602 Shortness of breath: Secondary | ICD-10-CM | POA: Diagnosis present

## 2013-08-06 DIAGNOSIS — I10 Essential (primary) hypertension: Secondary | ICD-10-CM | POA: Insufficient documentation

## 2013-08-06 DIAGNOSIS — Z87891 Personal history of nicotine dependence: Secondary | ICD-10-CM | POA: Insufficient documentation

## 2013-08-06 DIAGNOSIS — F0390 Unspecified dementia without behavioral disturbance: Secondary | ICD-10-CM | POA: Insufficient documentation

## 2013-08-06 DIAGNOSIS — J45901 Unspecified asthma with (acute) exacerbation: Secondary | ICD-10-CM

## 2013-08-06 DIAGNOSIS — Z8781 Personal history of (healed) traumatic fracture: Secondary | ICD-10-CM | POA: Insufficient documentation

## 2013-08-06 DIAGNOSIS — J441 Chronic obstructive pulmonary disease with (acute) exacerbation: Secondary | ICD-10-CM | POA: Insufficient documentation

## 2013-08-06 DIAGNOSIS — F209 Schizophrenia, unspecified: Secondary | ICD-10-CM | POA: Diagnosis not present

## 2013-08-06 DIAGNOSIS — F411 Generalized anxiety disorder: Secondary | ICD-10-CM | POA: Insufficient documentation

## 2013-08-06 DIAGNOSIS — Z853 Personal history of malignant neoplasm of breast: Secondary | ICD-10-CM | POA: Diagnosis not present

## 2013-08-06 DIAGNOSIS — I509 Heart failure, unspecified: Secondary | ICD-10-CM | POA: Insufficient documentation

## 2013-08-06 DIAGNOSIS — Z79899 Other long term (current) drug therapy: Secondary | ICD-10-CM | POA: Diagnosis not present

## 2013-08-06 DIAGNOSIS — F3289 Other specified depressive episodes: Secondary | ICD-10-CM | POA: Diagnosis not present

## 2013-08-06 DIAGNOSIS — F329 Major depressive disorder, single episode, unspecified: Secondary | ICD-10-CM | POA: Diagnosis not present

## 2013-08-06 DIAGNOSIS — R079 Chest pain, unspecified: Secondary | ICD-10-CM | POA: Insufficient documentation

## 2013-08-06 DIAGNOSIS — I252 Old myocardial infarction: Secondary | ICD-10-CM | POA: Insufficient documentation

## 2013-08-06 DIAGNOSIS — R634 Abnormal weight loss: Secondary | ICD-10-CM | POA: Insufficient documentation

## 2013-08-06 LAB — COMPREHENSIVE METABOLIC PANEL
ALT: 7 U/L (ref 0–35)
AST: 19 U/L (ref 0–37)
Albumin: 3.8 g/dL (ref 3.5–5.2)
Alkaline Phosphatase: 118 U/L — ABNORMAL HIGH (ref 39–117)
BUN: 18 mg/dL (ref 6–23)
CO2: 29 meq/L (ref 19–32)
CREATININE: 1.09 mg/dL (ref 0.50–1.10)
Calcium: 9.8 mg/dL (ref 8.4–10.5)
Chloride: 101 mEq/L (ref 96–112)
GFR calc Af Amer: 59 mL/min — ABNORMAL LOW (ref >90)
GFR, EST NON AFRICAN AMERICAN: 51 mL/min — AB (ref >90)
GLUCOSE: 89 mg/dL (ref 70–99)
Potassium: 4.2 mEq/L (ref 3.7–5.3)
SODIUM: 144 meq/L (ref 137–147)
TOTAL PROTEIN: 7.4 g/dL (ref 6.0–8.3)
Total Bilirubin: 0.4 mg/dL (ref 0.3–1.2)

## 2013-08-06 LAB — CBC WITH DIFFERENTIAL/PLATELET
Basophils Absolute: 0 10*3/uL (ref 0.0–0.1)
Basophils Relative: 1 % (ref 0–1)
EOS PCT: 3 % (ref 0–5)
Eosinophils Absolute: 0.1 10*3/uL (ref 0.0–0.7)
HCT: 38 % (ref 36.0–46.0)
Hemoglobin: 12.1 g/dL (ref 12.0–15.0)
LYMPHS ABS: 1.3 10*3/uL (ref 0.7–4.0)
LYMPHS PCT: 33 % (ref 12–46)
MCH: 30.6 pg (ref 26.0–34.0)
MCHC: 31.8 g/dL (ref 30.0–36.0)
MCV: 96.2 fL (ref 78.0–100.0)
MONO ABS: 0.4 10*3/uL (ref 0.1–1.0)
Monocytes Relative: 9 % (ref 3–12)
Neutro Abs: 2.2 10*3/uL (ref 1.7–7.7)
Neutrophils Relative %: 54 % (ref 43–77)
Platelets: 190 10*3/uL (ref 150–400)
RBC: 3.95 MIL/uL (ref 3.87–5.11)
RDW: 13.2 % (ref 11.5–15.5)
WBC: 4 10*3/uL (ref 4.0–10.5)

## 2013-08-06 LAB — TROPONIN I

## 2013-08-06 LAB — LIPASE, BLOOD: LIPASE: 54 U/L (ref 11–59)

## 2013-08-06 NOTE — Discharge Instructions (Signed)

## 2013-08-06 NOTE — ED Notes (Signed)
Attempted IV access 2x.

## 2013-08-06 NOTE — ED Notes (Signed)
EDP at the bedside.  ?

## 2013-08-06 NOTE — ED Provider Notes (Signed)
CSN: [id number]     Arrival date & time 08/06/13  1817 History   First MD Initiated Contact with Patient 08/06/13 1840     Chief Complaint  Patient presents with  . Chest Pain  . Shortness of Breath     (Consider location/radiation/quality/duration/timing/severity/associated sxs/prior Treatment) Patient is a 68 y.o. female presenting with chest pain and shortness of breath. The history is provided by the patient, the nursing home and the EMS personnel.  Chest Pain Associated symptoms: shortness of breath   Shortness of Breath Associated symptoms: chest pain    level V caveat due to dementia. Patient presents with left-sided chest pain. Reportedly started today. She points her left lower chest. It is worse with palpation. She denies trouble breathing. Nurse's note states that she had been complaining of trouble breathing. No fevers. No trauma. Past Medical History  Diagnosis Date  . Weight loss, unintentional   . Trouble swallowing   . Change in voice   . Substance abuse   . Dementia   . Asthma   . Shortness of breath   . Seizures     "    It has been along time "  . CHF (congestive heart failure)   . Headache(784.0)   . Hypertension   . Arthralgia   . Tremors of nervous system   . Anxiety   . Rib fractures     hx of May 2012  . Heart attack 05/21/10  . Schizophrenia   . Coronary artery disease   . Stroke 07/11/10    hx of R CVA   . COPD (chronic obstructive pulmonary disease)   . Obstructive sleep apnea   . 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 Schizophrenia   . Depressive disorder    Past Surgical History  Procedure Laterality Date  . Total abdominal hysterectomy  age 71   Family History  Problem Relation Age of Onset  . Breast cancer Mother   . Birth defects Mother     breast  . Cancer Mother     breast  . Heart disease Father     heart attack  . Heart attack Father   . Heart attack Brother   . Cancer Brother    throat, lung  . Cancer Paternal Aunt   . Cancer Maternal Grandmother     breast    History  Substance Use Topics  . Smoking status: Former Smoker -- 1.00 packs/day for 50 years    Types: Cigarettes    Quit date: 02/22/2008  . Smokeless tobacco: Never Used     Comment: started smoking at age 51  . Alcohol Use: No     Comment: social use   OB History   Grav Para Term Preterm Abortions TAB SAB Ect Mult Living                 Review of Systems  Unable to perform ROS Respiratory: Positive for shortness of breath.   Cardiovascular: Positive for chest pain.      Allergies  Codeine; Penicillins; and Hydralazine  Home Medications   Prior to Admission medications   Medication Sig Start Date End Date Taking? Authorizing Jeancarlo Leffler  ALPRAZolam (XANAX) 0.25 MG tablet Take 0.25 mg by mouth 3 (three) times daily as needed for anxiety.   Yes Historical Orvil Faraone, MD  atorvastatin (LIPITOR) 10 MG tablet Take 10 mg by mouth daily.   Yes Historical Eliot Popper, MD  budesonide-formoterol (  SYMBICORT) 80-4.5 MCG/ACT inhaler Inhale 2 puffs into the lungs 2 (two) times daily.   Yes Historical Maddock Finigan, MD  carvedilol (COREG) 3.125 MG tablet Take 3.125 mg by mouth 2 (two) times daily with a meal.   Yes Historical Tychelle Purkey, MD  citalopram (CELEXA) 10 MG tablet Take 10 mg by mouth daily.   Yes Historical Shamarion Coots, MD  furosemide (LASIX) 40 MG tablet Take 40 mg by mouth daily.   Yes Historical Alayha Babineaux, MD  lisinopril (PRINIVIL,ZESTRIL) 5 MG tablet Take 5 mg by mouth daily.   Yes Historical Woodie Degraffenreid, MD  Melatonin 5 MG TABS Take 5 mg by mouth at bedtime.   Yes Historical Esvin Hnat, MD  Multiple Vitamin (MULTIVITAMIN WITH MINERALS) TABS tablet Take 1 tablet by mouth daily. 12/12/12  Yes Daniel J Angiulli, PA-C  QUEtiapine (SEROQUEL) 25 MG tablet Take 25 mg by mouth 2 (two) times daily.   Yes Historical Ruffus Kamaka, MD  albuterol (PROVENTIL) 90 MCG/ACT inhaler Inhale 2 puffs into the lungs every 6 (six) hours  as needed. For wheezing     Historical Norie Latendresse, MD  nitroGLYCERIN (NITROSTAT) 0.4 MG SL tablet Place 1 tablet (0.4 mg total) under the tongue every 5 (five) minutes as needed. For chest pain 12/12/12   Lavon Paganini Angiulli, PA-C   BP 107/70  Pulse 52  Temp(Src) 98.6 F (37 C) (Tympanic)  Resp 16  SpO2 100% Physical Exam  Nursing note and vitals reviewed. Constitutional: She appears well-developed and well-nourished.  Patient is slender  HENT:  Head: Normocephalic and atraumatic.  Eyes: EOM are normal. Pupils are equal, round, and reactive to light.  Neck: Normal range of motion. Neck supple.  Cardiovascular: Normal rate, regular rhythm and normal heart sounds.   No murmur heard. Pulmonary/Chest: Effort normal and breath sounds normal. No respiratory distress. She has no wheezes. She has no rales. She exhibits tenderness.  Some tenderness to left lower anterior chest wall. No crepitus or deformity.  Abdominal: Soft. Bowel sounds are normal. She exhibits no distension. There is no tenderness. There is no rebound and no guarding.  Musculoskeletal: Normal range of motion. She exhibits no tenderness.  Neurological: She is alert. No cranial nerve deficit.  Mild confusion  Skin: Skin is warm and dry.  Psychiatric: She has a normal mood and affect. Her speech is normal.    ED Course  Procedures (including critical care time) Labs Review Labs Reviewed  COMPREHENSIVE METABOLIC PANEL - Abnormal; Notable for the following:    Alkaline Phosphatase 118 (*)    GFR calc non Af Amer 51 (*)    GFR calc Af Amer 59 (*)    All other components within normal limits  CBC WITH DIFFERENTIAL  LIPASE, BLOOD  TROPONIN I    Imaging Review Dg Chest 2 View  08/06/2013   CLINICAL DATA:  Chest pain for the last 2 months. History of asthma.  EXAM: CHEST  2 VIEW  COMPARISON:  Multiple priors dating back to 06/24/2012.  FINDINGS: Heart size upper limits of normal for projection. Aortic arch atherosclerosis.  Emphysema with pulmonary parenchymal scarring. Basilar atelectasis. Tortuous ascending aorta. No interval change in the mediastinal contours compared to prior. Chronic bronchitic changes.  No airspace disease. There may be a tiny left pleural effusion with blunting of the costophrenic angle.  IMPRESSION: Emphysema and chronic bronchitic changes. Possible tiny left effusion.   Electronically Signed   By: Dereck Ligas M.D.   On: 08/06/2013 20:27     EKG Interpretation   Date/Time:  Tuesday August 06 2013 18:19:57 EDT Ventricular Rate:  62 PR Interval:  176 QRS Duration: 155 QT Interval:  488 QTC Calculation: 496 R Axis:   -77 Text Interpretation:  Sinus rhythm Left bundle branch block No significant  change since last tracing Reconfirmed by PICKERING  MD, Ovid Curd [phone]) on  08/06/2013 6:42:00 PM      MDM   Final diagnoses:  Chest pain    Patient with left lower chest pain. Some worse with palpation. Lab work and EKG are reassuring. X-ray reassuring. Doubt pulmonary embolism or coronary disease. No rib fractures on x-ray. No subcutaneous emphysema. Will discharge home to followup as needed.    Jasper Riling. Alvino Chapel, MD 08/06/13 2340

## 2013-08-06 NOTE — ED Notes (Signed)
Pt to department via EMS from [facility]- pt reports that she started having chest pain and SOB today. Pt given 5mg  albuterol and 0.5mg  atrovent. 324 ASA Bp-116/78 Hr-64 RR-22 O2-96 CBg-86

## 2013-08-07 NOTE — ED Notes (Signed)
Report called to [facility]. PTAR at transport patient back to facility

## 2013-10-14 ENCOUNTER — Emergency Department (HOSPITAL_COMMUNITY)
Admission: EM | Admit: 2013-10-14 | Discharge: 2013-10-15 | Disposition: A | Discharge status: Home / Self Care | Payer: Medicare Other | Attending: Emergency Medicine | Admitting: Emergency Medicine

## 2013-10-14 ENCOUNTER — Emergency Department (HOSPITAL_COMMUNITY): Payer: Medicare Other

## 2013-10-14 DIAGNOSIS — Z8739 Personal history of other diseases of the musculoskeletal system and connective tissue: Secondary | ICD-10-CM | POA: Diagnosis not present

## 2013-10-14 DIAGNOSIS — R296 Repeated falls: Secondary | ICD-10-CM | POA: Insufficient documentation

## 2013-10-14 DIAGNOSIS — Z853 Personal history of malignant neoplasm of breast: Secondary | ICD-10-CM | POA: Diagnosis not present

## 2013-10-14 DIAGNOSIS — Z87891 Personal history of nicotine dependence: Secondary | ICD-10-CM | POA: Insufficient documentation

## 2013-10-14 DIAGNOSIS — J4489 Other specified chronic obstructive pulmonary disease: Secondary | ICD-10-CM | POA: Insufficient documentation

## 2013-10-14 DIAGNOSIS — I2510 Atherosclerotic heart disease of native coronary artery without angina pectoris: Secondary | ICD-10-CM | POA: Diagnosis not present

## 2013-10-14 DIAGNOSIS — I10 Essential (primary) hypertension: Secondary | ICD-10-CM | POA: Insufficient documentation

## 2013-10-14 DIAGNOSIS — F329 Major depressive disorder, single episode, unspecified: Secondary | ICD-10-CM | POA: Insufficient documentation

## 2013-10-14 DIAGNOSIS — F411 Generalized anxiety disorder: Secondary | ICD-10-CM | POA: Diagnosis not present

## 2013-10-14 DIAGNOSIS — Z8669 Personal history of other diseases of the nervous system and sense organs: Secondary | ICD-10-CM | POA: Insufficient documentation

## 2013-10-14 DIAGNOSIS — Y939 Activity, unspecified: Secondary | ICD-10-CM | POA: Insufficient documentation

## 2013-10-14 DIAGNOSIS — I509 Heart failure, unspecified: Secondary | ICD-10-CM | POA: Insufficient documentation

## 2013-10-14 DIAGNOSIS — Z8781 Personal history of (healed) traumatic fracture: Secondary | ICD-10-CM | POA: Diagnosis not present

## 2013-10-14 DIAGNOSIS — F3289 Other specified depressive episodes: Secondary | ICD-10-CM | POA: Diagnosis not present

## 2013-10-14 DIAGNOSIS — Z880 Allergy status to penicillin: Secondary | ICD-10-CM | POA: Insufficient documentation

## 2013-10-14 DIAGNOSIS — Z79899 Other long term (current) drug therapy: Secondary | ICD-10-CM | POA: Diagnosis not present

## 2013-10-14 DIAGNOSIS — Z8673 Personal history of transient ischemic attack (TIA), and cerebral infarction without residual deficits: Secondary | ICD-10-CM | POA: Insufficient documentation

## 2013-10-14 DIAGNOSIS — M545 Low back pain, unspecified: Secondary | ICD-10-CM

## 2013-10-14 DIAGNOSIS — Y929 Unspecified place or not applicable: Secondary | ICD-10-CM | POA: Diagnosis not present

## 2013-10-14 DIAGNOSIS — F0390 Unspecified dementia without behavioral disturbance: Secondary | ICD-10-CM | POA: Diagnosis not present

## 2013-10-14 DIAGNOSIS — IMO0002 Reserved for concepts with insufficient information to code with codable children: Secondary | ICD-10-CM | POA: Insufficient documentation

## 2013-10-14 DIAGNOSIS — J449 Chronic obstructive pulmonary disease, unspecified: Secondary | ICD-10-CM | POA: Diagnosis not present

## 2013-10-14 DIAGNOSIS — W19XXXA Unspecified fall, initial encounter: Secondary | ICD-10-CM

## 2013-10-14 LAB — I-STAT CHEM 8, ED
BUN: 23 mg/dL (ref 6–23)
Calcium, Ion: 1.08 mmol/L — ABNORMAL LOW (ref 1.13–1.30)
Chloride: 106 mEq/L (ref 96–112)
Creatinine, Ser: 1.1 mg/dL (ref 0.50–1.10)
Glucose, Bld: 80 mg/dL (ref 70–99)
HCT: 41 % (ref 36.0–46.0)
HEMOGLOBIN: 13.9 g/dL (ref 12.0–15.0)
Potassium: 5.1 mEq/L (ref 3.7–5.3)
SODIUM: 142 meq/L (ref 137–147)
TCO2: 28 mmol/L (ref 0–100)

## 2013-10-14 MED ORDER — KETOROLAC TROMETHAMINE 30 MG/ML IJ SOLN
30.0000 mg | Freq: Once | INTRAMUSCULAR | Status: AC
  Administered 2013-10-15: 30 mg via INTRAVENOUS
  Filled 2013-10-14: qty 1

## 2013-10-14 NOTE — ED Notes (Signed)
Bed: [facility] Expected date: 10/14/13 Expected time: 10:06 PM Means of arrival: Ambulance Comments: Fall back pain

## 2013-10-14 NOTE — ED Notes (Signed)
Per EMS pt from [facility] fell at facility 10 hrs prior to calling EMS.  Pt has dementia but is able to say her lower back hurts.  No deformity noted. VSS.

## 2013-10-15 ENCOUNTER — Emergency Department (HOSPITAL_COMMUNITY): Payer: Medicare Other

## 2013-10-15 DIAGNOSIS — IMO0002 Reserved for concepts with insufficient information to code with codable children: Secondary | ICD-10-CM | POA: Diagnosis not present

## 2013-10-15 LAB — URINALYSIS, ROUTINE W REFLEX MICROSCOPIC
Bilirubin Urine: NEGATIVE
Glucose, UA: NEGATIVE mg/dL
Hgb urine dipstick: NEGATIVE
Ketones, ur: NEGATIVE mg/dL
Nitrite: NEGATIVE
PROTEIN: NEGATIVE mg/dL
Specific Gravity, Urine: 1.018 (ref 1.005–1.030)
UROBILINOGEN UA: 1 mg/dL (ref 0.0–1.0)
pH: 6 (ref 5.0–8.0)

## 2013-10-15 LAB — URINE MICROSCOPIC-ADD ON

## 2013-10-15 MED ORDER — MELOXICAM 7.5 MG PO TABS: 7.5000 mg | ORAL_TABLET | Freq: Every day | ORAL | Status: DC

## 2013-10-15 NOTE — ED Provider Notes (Signed)
Medical screening examination/treatment/procedure(s) were conducted as a shared visit with non-physician practitioner(s) and myself.  I personally evaluated the patient during the encounter.   EKG Interpretation   Date/Time:  Tuesday October 15 2013 00:49:41 EDT Ventricular Rate:  53 PR Interval:  172 QRS Duration: 166 QT Interval:  536 QTC Calculation: 502 R Axis:   83 Text Interpretation:  Sinus bradycardia Left bundle branch block Abnormal  ECG No significant change was found Confirmed by Debby Freiberg [phone])  on 10/15/2013 1:08:05 AM      I performed an examination on the patient including cardiac, pulmonary, and gi systems which were unremarkable except as documented by the PA.  Additionally her neurologic exam was not focal deficit.  Briefly she is a 68 year old female presenting with back pain after fall 3 days ago. No fracture identified on examination. Patient was discharged home after unremarkable workup.  Debby Freiberg, MD 10/15/13 [phone]

## 2013-10-15 NOTE — Discharge Instructions (Signed)
Please return to the ED if you have loss of your bowel or bladder or tingling around the genital region!  Back Pain, Adult Low back pain is very common. About 1 in 5 people have back pain.The cause of low back pain is rarely dangerous. The pain often gets better over time.About half of people with a sudden onset of back pain feel better in just 2 weeks. About 8 in 10 people feel better by 6 weeks.  CAUSES Some common causes of back pain include:  Strain of the muscles or ligaments supporting the spine.  Wear and tear (degeneration) of the spinal discs.  Arthritis.  Direct injury to the back. DIAGNOSIS Most of the time, the direct cause of low back pain is not known.However, back pain can be treated effectively even when the exact cause of the pain is unknown.Answering your caregiver's questions about your overall health and symptoms is one of the most accurate ways to make sure the cause of your pain is not dangerous. If your caregiver needs more information, he or she may order lab work or imaging tests (X-rays or MRIs).However, even if imaging tests show changes in your back, this usually does not require surgery. HOME CARE INSTRUCTIONS For many people, back pain returns.Since low back pain is rarely dangerous, it is often a condition that people can learn to [facility] their own.   Remain active. It is stressful on the back to sit or stand in one place. Do not sit, drive, or stand in one place for more than 30 minutes at a time. Take short walks on level surfaces as soon as pain allows.Try to increase the length of time you walk each day.  Do not stay in bed.Resting more than 1 or 2 days can delay your recovery.  Do not avoid exercise or work.Your body is made to move.It is not dangerous to be active, even though your back may hurt.Your back will likely heal faster if you return to being active before your pain is gone.  Pay attention to your body when you bend and lift. Many  people have less discomfortwhen lifting if they bend their knees, keep the load close to their bodies,and avoid twisting. Often, the most comfortable positions are those that put less stress on your recovering back.  Find a comfortable position to sleep. Use a firm mattress and lie on your side with your knees slightly bent. If you lie on your back, put a pillow under your knees.  Only take over-the-counter or prescription medicines as directed by your caregiver. Over-the-counter medicines to reduce pain and inflammation are often the most helpful.Your caregiver may prescribe muscle relaxant drugs.These medicines help dull your pain so you can more quickly return to your normal activities and healthy exercise.  Put ice on the injured area.  Put ice in a plastic bag.  Place a towel between your skin and the bag.  Leave the ice on for 15-20 minutes, 03-04 times a day for the first 2 to 3 days. After that, ice and heat may be alternated to reduce pain and spasms.  Ask your caregiver about trying back exercises and gentle massage. This may be of some benefit.  Avoid feeling anxious or stressed.Stress increases muscle tension and can worsen back pain.It is important to recognize when you are anxious or stressed and learn ways to manage it.Exercise is a great option. SEEK MEDICAL CARE IF:  You have pain that is not relieved with rest or medicine.  You have  pain that does not improve in 1 week.  You have new symptoms.  You are generally not feeling well. SEEK IMMEDIATE MEDICAL CARE IF:   You have pain that radiates from your back into your legs.  You develop new bowel or bladder control problems.  You have unusual weakness or numbness in your arms or legs.  You develop nausea or vomiting.  You develop abdominal pain.  You feel faint. Document Released: 02/07/2005 Document Revised: 08/09/2011 Document Reviewed: 06/11/2013 [facility] Patient Information 2015 [place], [place]. This  information is not intended to replace advice given to you by your health care provider. Make sure you discuss any questions you have with your health care provider.

## 2013-10-15 NOTE — ED Provider Notes (Signed)
CSN: [id number]     Arrival date & time 10/14/13  2222 History   First MD Initiated Contact with Patient 10/14/13 2253     Chief Complaint  Patient presents with  . Fall  . Back Pain   Patient is a 68 y.o. female presenting with fall and back pain.  Fall  Back Pain   Patient is a 68 y.o. Female who presents to the ED with low back pain x 1 day.  Patient states that she developed back pain when she woke up this morning.  Back pain is left sided lumbar pain which is aching in nature.  Patient states that pain does not radiate.  Patient notes that she fell yesterday but denies hitting her head or any LOC.  Patient states that she has never had a previous issue with back problems.  Patient's pain is increased with walking and standing.  Patient has tried no relieving factors for her back pain.  Patient denies tingling, numbness, loss of bowel or bladder, saddle anesthsia, previous surgery to her back, active cancers, or IV drug use.  Upon chart review patient does have a history of breast cancer and polysubstance abuse listed in her PMH.  Patient denies fever, chills, nausea, vomiting, shortness of breath, chest pain, urinary symptoms, diarrhea, or constipation.  All other ROS are negative at this time.  Past Medical History  Diagnosis Date  . Weight loss, unintentional   . Trouble swallowing   . Change in voice   . Substance abuse   . Dementia   . Asthma   . Shortness of breath   . Seizures     "    It has been along time "  . CHF (congestive heart failure)   . Headache(784.0)   . Hypertension   . Arthralgia   . Tremors of nervous system   . Anxiety   . Rib fractures     hx of May 2012  . Heart attack 05/21/10  . Schizophrenia   . Coronary artery disease   . Stroke 07/11/10    hx of R CVA   . COPD (chronic obstructive pulmonary disease)   . Obstructive sleep apnea   . 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 Schizophrenia   . Depressive disorder     Past Surgical History  Procedure Laterality Date  . Total abdominal hysterectomy  age 66   Family History  Problem Relation Age of Onset  . Breast cancer Mother   . Birth defects Mother     breast  . Cancer Mother     breast  . Heart disease Father     heart attack  . Heart attack Father   . Heart attack Brother   . Cancer Brother     throat, lung  . Cancer Paternal Aunt   . Cancer Maternal Grandmother     breast    History  Substance Use Topics  . Smoking status: Former Smoker -- 1.00 packs/day for 50 years    Types: Cigarettes    Quit date: 02/22/2008  . Smokeless tobacco: Never Used     Comment: started smoking at age 52  . Alcohol Use: No     Comment: social use   OB History   Grav Para Term Preterm Abortions TAB SAB Ect Mult Living                 Review of Systems  Musculoskeletal: Positive  for back pain.    See HPI  Allergies  Codeine; Penicillins; and Hydralazine  Home Medications   Prior to Admission medications   Medication Sig Start Date End Date Taking? Authorizing Provider  albuterol (PROVENTIL) 90 MCG/ACT inhaler Inhale 2 puffs into the lungs every 6 (six) hours as needed. For wheezing    Yes Historical Provider, MD  ALPRAZolam (XANAX) 0.25 MG tablet Take 0.25 mg by mouth 3 (three) times daily as needed for anxiety.   Yes Historical Provider, MD  atorvastatin (LIPITOR) 10 MG tablet Take 10 mg by mouth daily.   Yes Historical Provider, MD  budesonide-formoterol (SYMBICORT) 80-4.5 MCG/ACT inhaler Inhale 2 puffs into the lungs 2 (two) times daily.   Yes Historical Provider, MD  carvedilol (COREG) 3.125 MG tablet Take 3.125 mg by mouth 2 (two) times daily with a meal.   Yes Historical Provider, MD  citalopram (CELEXA) 10 MG tablet Take 10 mg by mouth daily.   Yes Historical Provider, MD  furosemide (LASIX) 40 MG tablet Take 40 mg by mouth daily.   Yes Historical Provider, MD  lisinopril (PRINIVIL,ZESTRIL) 5 MG tablet Take 5 mg by mouth daily.    Yes Historical Provider, MD  Melatonin 5 MG TABS Take 5 mg by mouth at bedtime.   Yes Historical Provider, MD  Multiple Vitamin (MULTIVITAMIN WITH MINERALS) TABS tablet Take 1 tablet by mouth daily. 12/12/12  Yes Daniel J Angiulli, PA-C  nitroGLYCERIN (NITROSTAT) 0.4 MG SL tablet Place 1 tablet (0.4 mg total) under the tongue every 5 (five) minutes as needed. For chest pain 12/12/12  Yes Daniel J Angiulli, PA-C  QUEtiapine (SEROQUEL) 25 MG tablet Take 25 mg by mouth 2 (two) times daily.   Yes Historical Provider, MD   BP 101/60  Pulse 52  Temp(Src) 97.9 F (36.6 C) (Oral)  Resp 16  SpO2 100% Physical Exam  Nursing note and vitals reviewed. Constitutional: She is oriented to person, place, and time. She appears cachectic. No distress.  HENT:  Head: Normocephalic and atraumatic.  Mouth/Throat: Oropharynx is clear and moist. No oropharyngeal exudate.  Eyes: Conjunctivae and EOM are normal. Pupils are equal, round, and reactive to light. No scleral icterus.  Neck: Normal range of motion. Neck supple. No JVD present. No thyromegaly present.  Cardiovascular: Normal rate, regular rhythm, normal heart sounds and intact distal pulses.  Exam reveals no gallop and no friction rub.   No murmur heard. Pulmonary/Chest: Effort normal and breath sounds normal. No respiratory distress. She has no wheezes. She has no rales. She exhibits no tenderness.  Abdominal: Soft. Bowel sounds are normal. She exhibits no distension and no mass. There is no tenderness. There is no rebound and no guarding.  No pulsatile mass on exam  Musculoskeletal:  Patient rises slowly from sitting to standing.  They walk with an antalgic gait to the left.  There is no evidence of erythema, ecchymosis, or gross deformity.  There is tenderness to palpation over the left lumbar paraspinal muscles.  Active ROM is limited due to pain.  Sensation to light touch is intact over all extremities.  Strength is symmetric and equal in all  extremities.    Lymphadenopathy:    She has no cervical adenopathy.  Neurological: She is alert and oriented to person, place, and time. She has normal strength. No cranial nerve deficit or sensory deficit. Coordination normal.  Antalgic gait to the left  Skin: Skin is warm and dry. She is not diaphoretic.  Psychiatric: She has a normal  mood and affect. Her behavior is normal. Judgment and thought content normal.  Speaks with slurred speech.    ED Course  Procedures (including critical care time) Labs Review Labs Reviewed  URINALYSIS, ROUTINE W REFLEX MICROSCOPIC - Abnormal; Notable for the following:    APPearance CLOUDY (*)    Leukocytes, UA MODERATE (*)    All other components within normal limits  URINE MICROSCOPIC-ADD ON - Abnormal; Notable for the following:    Bacteria, UA FEW (*)    All other components within normal limits  I-STAT CHEM 8, ED - Abnormal; Notable for the following:    Calcium, Ion 1.08 (*)    All other components within normal limits    Imaging Review Dg Lumbar Spine Complete  10/15/2013   CLINICAL DATA:  Low back pain after a fall 3 days ago.  EXAM: LUMBAR SPINE - COMPLETE 4+ VIEW  COMPARISON:  06/29/2013  FINDINGS: There is no evidence of lumbar spine fracture. Alignment is normal. Intervertebral disc spaces are maintained. Calcification in the right upper quadrant probably represents a gallstone. Vascular calcifications.  IMPRESSION: No displaced fractures identified in the lumbar spine.   Electronically Signed   By: Lucienne Capers M.D.   On: 10/15/2013 00:11     EKG Interpretation None      MDM   Final diagnoses:  Left-sided low back pain without sciatica  Fall, initial encounter   Patient is a 68 y.o. Female who presents to the ED with back pain.  Patient has no red flags in history.  Physical exam is without neurological deficits.  CT of the head was performed given age and history of fall.  CT scan shows no acute abnormalities at this time.   Plain films of the spine show no acute bony abnormality.  Patient has a history of HTN which prompted checking an istat chem 8 to determine kidney function.  Patient had a normal SCr so patient was treated here with IM injection of toradol.  Given negative films and no red flag symptoms patient is stable to be discharged home at this time.  Will give a prescription for Mobic 7.5 mg qdaily.  Patient is to follow-up with her PCP.  Patient was told to return to the ED for cauda equina symptoms.  Patient was seen by and discussed with Dr. Colin Rhein who agrees with the above plan and workup.       Cherylann Parr, PA-C 10/15/13 [phone]

## 2013-11-14 ENCOUNTER — Encounter (HOSPITAL_COMMUNITY): Payer: Self-pay | Admitting: Emergency Medicine

## 2013-11-14 ENCOUNTER — Emergency Department (HOSPITAL_COMMUNITY)
Admission: EM | Admit: 2013-11-14 | Discharge: 2013-11-14 | Disposition: A | Discharge status: Home / Self Care | Payer: Medicare Other | Attending: Emergency Medicine | Admitting: Emergency Medicine

## 2013-11-14 ENCOUNTER — Emergency Department (HOSPITAL_COMMUNITY): Payer: Medicare Other

## 2013-11-14 DIAGNOSIS — I509 Heart failure, unspecified: Secondary | ICD-10-CM | POA: Insufficient documentation

## 2013-11-14 DIAGNOSIS — Z79899 Other long term (current) drug therapy: Secondary | ICD-10-CM | POA: Diagnosis not present

## 2013-11-14 DIAGNOSIS — F3289 Other specified depressive episodes: Secondary | ICD-10-CM | POA: Diagnosis not present

## 2013-11-14 DIAGNOSIS — Z8781 Personal history of (healed) traumatic fracture: Secondary | ICD-10-CM | POA: Diagnosis not present

## 2013-11-14 DIAGNOSIS — Z8659 Personal history of other mental and behavioral disorders: Secondary | ICD-10-CM | POA: Insufficient documentation

## 2013-11-14 DIAGNOSIS — Z853 Personal history of malignant neoplasm of breast: Secondary | ICD-10-CM | POA: Insufficient documentation

## 2013-11-14 DIAGNOSIS — N390 Urinary tract infection, site not specified: Secondary | ICD-10-CM

## 2013-11-14 DIAGNOSIS — F329 Major depressive disorder, single episode, unspecified: Secondary | ICD-10-CM | POA: Diagnosis not present

## 2013-11-14 DIAGNOSIS — J449 Chronic obstructive pulmonary disease, unspecified: Secondary | ICD-10-CM | POA: Insufficient documentation

## 2013-11-14 DIAGNOSIS — Z792 Long term (current) use of antibiotics: Secondary | ICD-10-CM | POA: Insufficient documentation

## 2013-11-14 DIAGNOSIS — J45909 Unspecified asthma, uncomplicated: Secondary | ICD-10-CM | POA: Diagnosis not present

## 2013-11-14 DIAGNOSIS — F0390 Unspecified dementia without behavioral disturbance: Secondary | ICD-10-CM | POA: Insufficient documentation

## 2013-11-14 DIAGNOSIS — N189 Chronic kidney disease, unspecified: Secondary | ICD-10-CM | POA: Insufficient documentation

## 2013-11-14 DIAGNOSIS — R42 Dizziness and giddiness: Secondary | ICD-10-CM | POA: Insufficient documentation

## 2013-11-14 DIAGNOSIS — I2510 Atherosclerotic heart disease of native coronary artery without angina pectoris: Secondary | ICD-10-CM | POA: Diagnosis not present

## 2013-11-14 DIAGNOSIS — R112 Nausea with vomiting, unspecified: Secondary | ICD-10-CM | POA: Diagnosis not present

## 2013-11-14 DIAGNOSIS — J4489 Other specified chronic obstructive pulmonary disease: Secondary | ICD-10-CM | POA: Insufficient documentation

## 2013-11-14 DIAGNOSIS — Z791 Long term (current) use of non-steroidal anti-inflammatories (NSAID): Secondary | ICD-10-CM | POA: Insufficient documentation

## 2013-11-14 DIAGNOSIS — Z8669 Personal history of other diseases of the nervous system and sense organs: Secondary | ICD-10-CM | POA: Insufficient documentation

## 2013-11-14 DIAGNOSIS — F411 Generalized anxiety disorder: Secondary | ICD-10-CM | POA: Diagnosis not present

## 2013-11-14 LAB — CBC WITH DIFFERENTIAL/PLATELET
Basophils Absolute: 0 10*3/uL (ref 0.0–0.1)
Basophils Relative: 0 % (ref 0–1)
EOS PCT: 1 % (ref 0–5)
Eosinophils Absolute: 0.1 10*3/uL (ref 0.0–0.7)
HEMATOCRIT: 39.6 % (ref 36.0–46.0)
Hemoglobin: 13.1 g/dL (ref 12.0–15.0)
LYMPHS ABS: 0.3 10*3/uL — AB (ref 0.7–4.0)
Lymphocytes Relative: 3 % — ABNORMAL LOW (ref 12–46)
MCH: 30.5 pg (ref 26.0–34.0)
MCHC: 33.1 g/dL (ref 30.0–36.0)
MCV: 92.1 fL (ref 78.0–100.0)
Monocytes Absolute: 0.6 10*3/uL (ref 0.1–1.0)
Monocytes Relative: 7 % (ref 3–12)
Neutro Abs: 7.3 10*3/uL (ref 1.7–7.7)
Neutrophils Relative %: 89 % — ABNORMAL HIGH (ref 43–77)
PLATELETS: 158 10*3/uL (ref 150–400)
RBC: 4.3 MIL/uL (ref 3.87–5.11)
RDW: 12.7 % (ref 11.5–15.5)
WBC: 8.1 10*3/uL (ref 4.0–10.5)

## 2013-11-14 LAB — BASIC METABOLIC PANEL
Anion gap: 12 (ref 5–15)
BUN: 24 mg/dL — ABNORMAL HIGH (ref 6–23)
CO2: 28 meq/L (ref 19–32)
Calcium: 9.4 mg/dL (ref 8.4–10.5)
Chloride: 103 mEq/L (ref 96–112)
Creatinine, Ser: 1.28 mg/dL — ABNORMAL HIGH (ref 0.50–1.10)
GFR calc Af Amer: 49 mL/min — ABNORMAL LOW (ref >90)
GFR, EST NON AFRICAN AMERICAN: 42 mL/min — AB (ref >90)
Glucose, Bld: 109 mg/dL — ABNORMAL HIGH (ref 70–99)
POTASSIUM: 4.3 meq/L (ref 3.7–5.3)
SODIUM: 143 meq/L (ref 137–147)

## 2013-11-14 LAB — URINALYSIS, ROUTINE W REFLEX MICROSCOPIC
BILIRUBIN URINE: NEGATIVE
Glucose, UA: NEGATIVE mg/dL
Hgb urine dipstick: NEGATIVE
Ketones, ur: NEGATIVE mg/dL
Nitrite: NEGATIVE
PH: 5 (ref 5.0–8.0)
Protein, ur: NEGATIVE mg/dL
SPECIFIC GRAVITY, URINE: 1.013 (ref 1.005–1.030)
Urobilinogen, UA: 0.2 mg/dL (ref 0.0–1.0)

## 2013-11-14 LAB — URINE MICROSCOPIC-ADD ON

## 2013-11-14 LAB — TROPONIN I: Troponin I: <0.3 ng/mL (ref <0.30)

## 2013-11-14 MED ORDER — CEPHALEXIN 500 MG PO CAPS: 500.0000 mg | ORAL_CAPSULE | Freq: Three times a day (TID) | ORAL | Status: DC

## 2013-11-14 MED ORDER — ONDANSETRON 4 MG PO TBDP: ORAL_TABLET | ORAL | Status: AC

## 2013-11-14 MED ORDER — LIDOCAINE HCL (PF) 1 % IJ SOLN
2.0000 mL | Freq: Once | INTRAMUSCULAR | Status: AC
  Administered 2013-11-14: 2 mL

## 2013-11-14 MED ORDER — CEFTRIAXONE SODIUM 1 G IJ SOLR
1.0000 g | Freq: Once | INTRAMUSCULAR | Status: AC
  Administered 2013-11-14: 1 g via INTRAMUSCULAR
  Filled 2013-11-14: qty 10

## 2013-11-14 MED ORDER — CEFTRIAXONE SODIUM 1 G IJ SOLR
1.0000 g | Freq: Once | INTRAMUSCULAR | Status: DC
  Filled 2013-11-14: qty 10

## 2013-11-14 NOTE — ED Provider Notes (Signed)
CSN: [id number]     Arrival date & time 11/14/13  0013 History   First MD Initiated Contact with Patient 11/14/13 [phone]     Chief Complaint  Patient presents with  . Dizziness     (Consider location/radiation/quality/duration/timing/severity/associated sxs/prior Treatment) HPI Comments: 68 year old female with multiple medical issues including heart failure, stroke, breast cancer, high blood pressure, cocaine abuse, schizophrenia, COPD presents with cough, dizziness, vomiting and shortness of breath. Patient feels her symptoms have improved with time throughout today. She had mild lightheadedness/dizziness that resolved on its own and denies new neurologic symptoms. Patient has had mild cough and shortness of breath which is resolved as well. Patient had nausea with episode of vomiting without blood.  Patient is a 68 y.o. female presenting with dizziness. The history is provided by the patient.  Dizziness Associated symptoms: nausea and vomiting   Associated symptoms: no blood in stool, no chest pain, no headaches and no shortness of breath     Past Medical History  Diagnosis Date  . Weight loss, unintentional   . Trouble swallowing   . Change in voice   . Substance abuse   . Dementia   . Asthma   . Shortness of breath   . Seizures     "    It has been along time "  . CHF (congestive heart failure)   . Headache(784.0)   . Hypertension   . Arthralgia   . Tremors of nervous system   . Anxiety   . Rib fractures     hx of May 2012  . Heart attack 05/21/10  . Schizophrenia   . Coronary artery disease   . Stroke 07/11/10    hx of R CVA   . COPD (chronic obstructive pulmonary disease)   . Obstructive sleep apnea   . 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 Schizophrenia   . Depressive disorder    Past Surgical History  Procedure Laterality Date  . Total abdominal hysterectomy  age 56   Family History  Problem Relation Age of Onset  .  Breast cancer Mother   . Birth defects Mother     breast  . Cancer Mother     breast  . Heart disease Father     heart attack  . Heart attack Father   . Heart attack Brother   . Cancer Brother     throat, lung  . Cancer Paternal Aunt   . Cancer Maternal Grandmother     breast    History  Substance Use Topics  . Smoking status: Former Smoker -- 1.00 packs/day for 50 years    Types: Cigarettes    Quit date: 02/22/2008  . Smokeless tobacco: Never Used     Comment: started smoking at age 89  . Alcohol Use: No     Comment: social use   OB History   Grav Para Term Preterm Abortions TAB SAB Ect Mult Living                 Review of Systems  Constitutional: Positive for appetite change. Negative for fever and chills.  HENT: Negative for congestion.   Eyes: Negative for visual disturbance.  Respiratory: Positive for cough. Negative for shortness of breath.   Cardiovascular: Negative for chest pain.  Gastrointestinal: Positive for nausea and vomiting. Negative for abdominal pain and blood in stool.  Genitourinary: Negative for dysuria and flank pain.  Musculoskeletal: Negative for back  pain, neck pain and neck stiffness.  Skin: Negative for rash.  Neurological: Positive for dizziness and light-headedness. Negative for syncope and headaches.      Allergies  Codeine; Penicillins; and Hydralazine  Home Medications   Prior to Admission medications   Medication Sig Start Date End Date Taking? Authorizing Provider  albuterol (PROVENTIL) 90 MCG/ACT inhaler Inhale 2 puffs into the lungs every 6 (six) hours as needed. For wheezing    Yes Historical Provider, MD  ALPRAZolam (XANAX) 0.25 MG tablet Take 0.25 mg by mouth 3 (three) times daily as needed for anxiety.   Yes Historical Provider, MD  atorvastatin (LIPITOR) 10 MG tablet Take 10 mg by mouth daily.   Yes Historical Provider, MD  budesonide-formoterol (SYMBICORT) 80-4.5 MCG/ACT inhaler Inhale 2 puffs into the lungs 2 (two)  times daily.   Yes Historical Provider, MD  carvedilol (COREG) 3.125 MG tablet Take 3.125 mg by mouth 2 (two) times daily with a meal.   Yes Historical Provider, MD  citalopram (CELEXA) 10 MG tablet Take 10 mg by mouth daily.   Yes Historical Provider, MD  furosemide (LASIX) 40 MG tablet Take 40 mg by mouth daily.   Yes Historical Provider, MD  lisinopril (PRINIVIL,ZESTRIL) 5 MG tablet Take 5 mg by mouth daily.   Yes Historical Provider, MD  Melatonin 5 MG TABS Take 5 mg by mouth at bedtime.   Yes Historical Provider, MD  meloxicam (MOBIC) 7.5 MG tablet Take 1 tablet (7.5 mg total) by mouth daily. 10/15/13  Yes Courtney A Forcucci, PA-C  Multiple Vitamin (MULTIVITAMIN WITH MINERALS) TABS tablet Take 1 tablet by mouth daily. 12/12/12  Yes Daniel J Angiulli, PA-C  QUEtiapine (SEROQUEL) 25 MG tablet Take 25 mg by mouth 2 (two) times daily.   Yes Historical Provider, MD  cephALEXin (KEFLEX) 500 MG capsule Take 1 capsule (500 mg total) by mouth 3 (three) times daily. 11/14/13   Mariea Clonts, MD  nitroGLYCERIN (NITROSTAT) 0.4 MG SL tablet Place 1 tablet (0.4 mg total) under the tongue every 5 (five) minutes as needed. For chest pain 12/12/12   Lavon Paganini Angiulli, PA-C  ondansetron (ZOFRAN ODT) 4 MG disintegrating tablet 54m ODT q4 hours prn nausea/vomit 11/14/13   JMariea Clonts MD   BP 115/68  Pulse 62  Temp(Src) 97.7 F (36.5 C) (Oral)  Resp 16  SpO2 100% Physical Exam  Nursing note and vitals reviewed. Constitutional: She is oriented to person, place, and time. She appears well-developed and well-nourished.  HENT:  Head: Normocephalic and atraumatic.  Mild dry mucous membranes  Eyes: Conjunctivae are normal. Right eye exhibits no discharge. Left eye exhibits no discharge.  Neck: Normal range of motion. Neck supple. No tracheal deviation present.  Cardiovascular: Normal rate and regular rhythm.   Pulmonary/Chest: Effort normal and breath sounds normal.  Abdominal: Soft. She exhibits no  distension. There is no tenderness. There is no guarding.  Musculoskeletal: She exhibits no edema.  Neurological: She is alert and oriented to person, place, and time. Disoriented: neck supple no meningismus.  Patient is mild general weakness,  mild difficulty with speech and voice which per report of similar. Patient moves extremities equal. Cranial nerves intact.  Skin: Skin is warm. No rash noted.    ED Course  Procedures (including critical care time) Labs Review Labs Reviewed  BASIC METABOLIC PANEL - Abnormal; Notable for the following:    Glucose, Bld 109 (*)    BUN 24 (*)    Creatinine, Ser 1.28 (*)  GFR calc non Af Amer 42 (*)    GFR calc Af Amer 49 (*)    All other components within normal limits  CBC WITH DIFFERENTIAL - Abnormal; Notable for the following:    Neutrophils Relative % 89 (*)    Lymphocytes Relative 3 (*)    Lymphs Abs 0.3 (*)    All other components within normal limits  URINALYSIS, ROUTINE W REFLEX MICROSCOPIC - Abnormal; Notable for the following:    APPearance CLOUDY (*)    Leukocytes, UA MODERATE (*)    All other components within normal limits  URINE MICROSCOPIC-ADD ON - Abnormal; Notable for the following:    Bacteria, UA MANY (*)    Casts HYALINE CASTS (*)    All other components within normal limits  URINE CULTURE  TROPONIN I    Imaging Review Dg Chest 2 View  11/14/2013   CLINICAL DATA:  Dizziness and shortness of breath  EXAM: CHEST  2 VIEW  COMPARISON:  08/06/2013  FINDINGS: There is chronic cardiomegaly. No definite change in mediastinal contours given distortion from rightward rotation. The aorta is atherosclerotic.  Pulmonary hyperinflation and apical emphysema. There is chronic scarring present at the left base which is mild. There is no edema, consolidation, effusion, or pneumothorax. Remote left posterior rib fractures.  IMPRESSION: Cardiomegaly and COPD.  No acute superimposed findings.   Electronically Signed   By: Jorje Guild M.D.    On: 11/14/2013 03:54     EKG Interpretation   Date/Time:  Thursday November 14 2013 00:48:18 EDT Ventricular Rate:  63 PR Interval:  188 QRS Duration: 160 QT Interval:  514 QTC Calculation: 526 R Axis:   85 Text Interpretation:  Sinus rhythm Left bundle branch block Confirmed by  DOCHERTY  MD, MEGAN ([id number]) on 11/14/2013 12:55:01 AM      MDM   Final diagnoses:  UTI (lower urinary tract infection)  Non-intractable vomiting with nausea, vomiting of unspecified type  Lightheadedness  CRF (chronic renal failure), unspecified stage   Patient presents with multiple different symptoms most which improved without treatment. Due to broad differential with vomiting, lightheadedness screening labs obtained. Chest x-ray obtained with cough and no acute findings. Reviewed. EKG left bundle branch block similar previous. Patient denies chest pain or shortness breath, exam. Blood work reviewed similar previous. On recheck patient feels okay and discussed close outpatient followup for which she is comfortable with. IM antibiotic given for a urine infection plan for oral antibiotics and close followup with primary Dr.  Willette Alma and differential diagnosis were discussed with the patient/parent/guardian. Close follow up outpatient was discussed, comfortable with the plan.   Medications  cefTRIAXone (ROCEPHIN) injection 1 g (1 g Intramuscular Given 11/14/13 0457)  lidocaine (PF) (XYLOCAINE) 1 % injection 2 mL (2 mLs Infiltration Given 11/14/13 0457)    Filed Vitals:   11/14/13 0400 11/14/13 0415 11/14/13 0500 11/14/13 0530  BP: 113/69 115/68  115/71  Pulse: 58 62 62 67  Temp:      TempSrc:      Resp: 16   16  SpO2: 100% 100% 100% 100%    Final diagnoses:  UTI (lower urinary tract infection)  Non-intractable vomiting with nausea, vomiting of unspecified type  Lightheadedness  CRF (chronic renal failure), unspecified stage        Mariea Clonts, MD 11/14/13 [phone]

## 2013-11-14 NOTE — ED Notes (Signed)
Pt tolerating PO fluids at this time.

## 2013-11-14 NOTE — Discharge Instructions (Signed)
If you were given medicines take as directed.  If you are on coumadin or contraceptives realize their levels and effectiveness is altered by many different medicines.  If you have any reaction (rash, tongues swelling, other) to the medicines stop taking and see a physician.   Please follow up as directed and return to the ER or see a physician for new or worsening symptoms.  Thank you. Filed Vitals:   11/14/13 0141 11/14/13 0200 11/14/13 0300 11/14/13 0400  BP:  108/64 112/57 113/69  Pulse:  59 57 58  Temp:      TempSrc:      Resp:  19 19 16   SpO2: 98% 100% 99% 100%

## 2013-11-14 NOTE — ED Notes (Signed)
Report given to PTAR 

## 2013-11-14 NOTE — ED Notes (Signed)
Per [facility] - pt from [facility] - pt w/ acute onset of dizziness - pt requested eval in ED - pt also c/o shortness of breath to EMS - lung sounds clear, no dyspnea noted by EMS, LBB on cardiac monitor, no orthostatic changes on eval - CBG 93. BP 110 palpated.

## 2013-11-15 LAB — URINE CULTURE: Colony Count: >=100000

## 2014-04-05 ENCOUNTER — Emergency Department (HOSPITAL_COMMUNITY): Payer: Medicare Other

## 2014-04-05 ENCOUNTER — Inpatient Hospital Stay (HOSPITAL_COMMUNITY)
Admission: EM | Admit: 2014-04-05 | Discharge: 2014-04-08 | DRG: 069 | Disposition: A | Discharge status: Skilled Nursing Facility | Payer: Medicare Other | Attending: Cardiology | Admitting: Cardiology

## 2014-04-05 DIAGNOSIS — J45909 Unspecified asthma, uncomplicated: Secondary | ICD-10-CM | POA: Diagnosis present

## 2014-04-05 DIAGNOSIS — I10 Essential (primary) hypertension: Secondary | ICD-10-CM | POA: Diagnosis present

## 2014-04-05 DIAGNOSIS — Z87891 Personal history of nicotine dependence: Secondary | ICD-10-CM

## 2014-04-05 DIAGNOSIS — F209 Schizophrenia, unspecified: Secondary | ICD-10-CM | POA: Diagnosis present

## 2014-04-05 DIAGNOSIS — Z7951 Long term (current) use of inhaled steroids: Secondary | ICD-10-CM

## 2014-04-05 DIAGNOSIS — I2510 Atherosclerotic heart disease of native coronary artery without angina pectoris: Secondary | ICD-10-CM | POA: Diagnosis present

## 2014-04-05 DIAGNOSIS — I69354 Hemiplegia and hemiparesis following cerebral infarction affecting left non-dominant side: Secondary | ICD-10-CM

## 2014-04-05 DIAGNOSIS — F329 Major depressive disorder, single episode, unspecified: Secondary | ICD-10-CM | POA: Diagnosis present

## 2014-04-05 DIAGNOSIS — Z853 Personal history of malignant neoplasm of breast: Secondary | ICD-10-CM

## 2014-04-05 DIAGNOSIS — I69320 Aphasia following cerebral infarction: Secondary | ICD-10-CM

## 2014-04-05 DIAGNOSIS — G459 Transient cerebral ischemic attack, unspecified: Principal | ICD-10-CM | POA: Diagnosis present

## 2014-04-05 DIAGNOSIS — G4733 Obstructive sleep apnea (adult) (pediatric): Secondary | ICD-10-CM | POA: Diagnosis present

## 2014-04-05 DIAGNOSIS — I447 Left bundle-branch block, unspecified: Secondary | ICD-10-CM | POA: Diagnosis present

## 2014-04-05 DIAGNOSIS — N390 Urinary tract infection, site not specified: Secondary | ICD-10-CM | POA: Diagnosis present

## 2014-04-05 DIAGNOSIS — R479 Unspecified speech disturbances: Secondary | ICD-10-CM

## 2014-04-05 DIAGNOSIS — J449 Chronic obstructive pulmonary disease, unspecified: Secondary | ICD-10-CM | POA: Diagnosis present

## 2014-04-05 DIAGNOSIS — I420 Dilated cardiomyopathy: Secondary | ICD-10-CM | POA: Diagnosis present

## 2014-04-05 DIAGNOSIS — I5022 Chronic systolic (congestive) heart failure: Secondary | ICD-10-CM | POA: Diagnosis present

## 2014-04-05 DIAGNOSIS — R4182 Altered mental status, unspecified: Secondary | ICD-10-CM

## 2014-04-05 DIAGNOSIS — Z79899 Other long term (current) drug therapy: Secondary | ICD-10-CM

## 2014-04-05 DIAGNOSIS — F0390 Unspecified dementia without behavioral disturbance: Secondary | ICD-10-CM | POA: Diagnosis present

## 2014-04-05 DIAGNOSIS — Z66 Do not resuscitate: Secondary | ICD-10-CM | POA: Diagnosis present

## 2014-04-05 DIAGNOSIS — F419 Anxiety disorder, unspecified: Secondary | ICD-10-CM | POA: Diagnosis present

## 2014-04-05 DIAGNOSIS — Z803 Family history of malignant neoplasm of breast: Secondary | ICD-10-CM

## 2014-04-05 DIAGNOSIS — R634 Abnormal weight loss: Secondary | ICD-10-CM | POA: Diagnosis present

## 2014-04-05 LAB — COMPREHENSIVE METABOLIC PANEL
ALBUMIN: 3.5 g/dL (ref 3.5–5.2)
ALT: 10 U/L (ref 0–35)
AST: 22 U/L (ref 0–37)
Alkaline Phosphatase: 86 U/L (ref 39–117)
Anion gap: 6 (ref 5–15)
BUN: 16 mg/dL (ref 6–23)
CO2: 31 mmol/L (ref 19–32)
CREATININE: 1.06 mg/dL (ref 0.50–1.10)
Calcium: 9.7 mg/dL (ref 8.4–10.5)
Chloride: 104 mmol/L (ref 96–112)
GFR calc Af Amer: 61 mL/min — ABNORMAL LOW (ref >90)
GFR, EST NON AFRICAN AMERICAN: 52 mL/min — AB (ref >90)
Glucose, Bld: 81 mg/dL (ref 70–99)
Potassium: 3.9 mmol/L (ref 3.5–5.1)
SODIUM: 141 mmol/L (ref 135–145)
Total Bilirubin: 0.4 mg/dL (ref 0.3–1.2)
Total Protein: 7.1 g/dL (ref 6.0–8.3)

## 2014-04-05 LAB — URINALYSIS, ROUTINE W REFLEX MICROSCOPIC
Bilirubin Urine: NEGATIVE
Glucose, UA: NEGATIVE mg/dL
Hgb urine dipstick: NEGATIVE
Ketones, ur: NEGATIVE mg/dL
NITRITE: NEGATIVE
PROTEIN: NEGATIVE mg/dL
SPECIFIC GRAVITY, URINE: 1.013 (ref 1.005–1.030)
UROBILINOGEN UA: 1 mg/dL (ref 0.0–1.0)
pH: 6 (ref 5.0–8.0)

## 2014-04-05 LAB — URINE MICROSCOPIC-ADD ON

## 2014-04-05 LAB — CBC WITH DIFFERENTIAL/PLATELET
BASOS ABS: 0 10*3/uL (ref 0.0–0.1)
Basophils Relative: 1 % (ref 0–1)
EOS PCT: 3 % (ref 0–5)
Eosinophils Absolute: 0.1 10*3/uL (ref 0.0–0.7)
HCT: 40.4 % (ref 36.0–46.0)
Hemoglobin: 13.4 g/dL (ref 12.0–15.0)
Lymphocytes Relative: 32 % (ref 12–46)
Lymphs Abs: 1.2 10*3/uL (ref 0.7–4.0)
MCH: 30.5 pg (ref 26.0–34.0)
MCHC: 33.2 g/dL (ref 30.0–36.0)
MCV: 91.8 fL (ref 78.0–100.0)
MONO ABS: 0.3 10*3/uL (ref 0.1–1.0)
Monocytes Relative: 9 % (ref 3–12)
NEUTROS PCT: 55 % (ref 43–77)
Neutro Abs: 2.1 10*3/uL (ref 1.7–7.7)
PLATELETS: UNDETERMINED 10*3/uL (ref 150–400)
RBC: 4.4 MIL/uL (ref 3.87–5.11)
RDW: 12.8 % (ref 11.5–15.5)
WBC: 3.7 10*3/uL — AB (ref 4.0–10.5)

## 2014-04-05 LAB — CBG MONITORING, ED: GLUCOSE-CAPILLARY: 111 mg/dL — AB (ref 70–99)

## 2014-04-05 LAB — TROPONIN I: TROPONIN I: 0.06 ng/mL — AB (ref <0.031)

## 2014-04-05 MED ORDER — SODIUM CHLORIDE 0.9 % IV SOLN
INTRAVENOUS | Status: DC
  Administered 2014-04-06: 01:00:00 via INTRAVENOUS

## 2014-04-05 NOTE — ED Notes (Signed)
Upon initial triage/assessment, patient nodded that she has a history of seizures and began trying to verbalize something. Upon questioning she nodded that she had a seizure, in her opinion with the onset of today's symptoms. Also nodded the same set of questioning for the MD.

## 2014-04-05 NOTE — ED Provider Notes (Signed)
CSN: [id number]     Arrival date & time 04/05/14  2034 History   First MD Initiated Contact with Patient 04/05/14 2108     Chief Complaint  Patient presents with  . Altered Mental Status  . Facial Droop    Right side      (Consider location/radiation/quality/duration/timing/severity/associated sxs/prior Treatment) HPI Comments: Patient here after nursing home staff noted that she had altered mental status and possible right-sided facial droop. Patient has a history of stroke in the past and had resultant left-sided deficits consisting of left upper and left lower extremity weakness. Patient states that she had a seizure this evening. This was unwitnessed. Patient is a phasic from her prior CVA. She denies any shortness of breath or chest discomfort. Denies any abdominal discomfort. Denies any headache. Her last seen normal is unknown and emesis called and patient transported here.  Patient is a 69 y.o. female presenting with altered mental status. The history is provided by the patient and medical records. The history is limited by the condition of the patient.  Altered Mental Status   Past Medical History  Diagnosis Date  . Weight loss, unintentional   . Trouble swallowing   . Change in voice   . Substance abuse   . Dementia   . Asthma   . Shortness of breath   . Seizures     "    It has been along time "  . CHF (congestive heart failure)   . Headache(784.0)   . Hypertension   . Arthralgia   . Tremors of nervous system   . Anxiety   . Rib fractures     hx of May 2012  . Heart attack 05/21/10  . Schizophrenia   . Coronary artery disease   . Stroke 07/11/10    hx of R CVA   . COPD (chronic obstructive pulmonary disease)   . Obstructive sleep apnea   . 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 Schizophrenia   . Depressive disorder    Past Surgical History  Procedure Laterality Date  . Total abdominal hysterectomy  age 66   Family  History  Problem Relation Age of Onset  . Breast cancer Mother   . Birth defects Mother     breast  . Cancer Mother     breast  . Heart disease Father     heart attack  . Heart attack Father   . Heart attack Brother   . Cancer Brother     throat, lung  . Cancer Paternal Aunt   . Cancer Maternal Grandmother     breast    History  Substance Use Topics  . Smoking status: Former Smoker -- 1.00 packs/day for 50 years    Types: Cigarettes    Quit date: 02/22/2008  . Smokeless tobacco: Never Used     Comment: started smoking at age 36  . Alcohol Use: No     Comment: social use   OB History    No data available     Review of Systems  Unable to perform ROS     Allergies  Codeine; Penicillins; and Hydralazine  Home Medications   Prior to Admission medications   Medication Sig Start Date End Date Taking? Authorizing Provider  albuterol (PROVENTIL) 90 MCG/ACT inhaler Inhale 2 puffs into the lungs every 6 (six) hours as needed. For wheezing     Historical Provider, MD  ALPRAZolam Duanne Moron) 0.25 MG  tablet Take 0.25 mg by mouth 3 (three) times daily as needed for anxiety.    Historical Provider, MD  atorvastatin (LIPITOR) 10 MG tablet Take 10 mg by mouth daily.    Historical Provider, MD  budesonide-formoterol (SYMBICORT) 80-4.5 MCG/ACT inhaler Inhale 2 puffs into the lungs 2 (two) times daily.    Historical Provider, MD  carvedilol (COREG) 3.125 MG tablet Take 3.125 mg by mouth 2 (two) times daily with a meal.    Historical Provider, MD  cephALEXin (KEFLEX) 500 MG capsule Take 1 capsule (500 mg total) by mouth 3 (three) times daily. 11/14/13   Mariea Clonts, MD  citalopram (CELEXA) 10 MG tablet Take 10 mg by mouth daily.    Historical Provider, MD  furosemide (LASIX) 40 MG tablet Take 40 mg by mouth daily.    Historical Provider, MD  lisinopril (PRINIVIL,ZESTRIL) 5 MG tablet Take 5 mg by mouth daily.    Historical Provider, MD  Melatonin 5 MG TABS Take 5 mg by mouth at bedtime.     Historical Provider, MD  meloxicam (MOBIC) 7.5 MG tablet Take 1 tablet (7.5 mg total) by mouth daily. 10/15/13   Courtney A Forcucci, PA-C  Multiple Vitamin (MULTIVITAMIN WITH MINERALS) TABS tablet Take 1 tablet by mouth daily. 12/12/12   Lavon Paganini Angiulli, PA-C  nitroGLYCERIN (NITROSTAT) 0.4 MG SL tablet Place 1 tablet (0.4 mg total) under the tongue every 5 (five) minutes as needed. For chest pain 12/12/12   Lavon Paganini Angiulli, PA-C  ondansetron (ZOFRAN ODT) 4 MG disintegrating tablet 59m ODT q4 hours prn nausea/vomit 11/14/13   JMariea Clonts MD  QUEtiapine (SEROQUEL) 25 MG tablet Take 25 mg by mouth 2 (two) times daily.    Historical Provider, MD   BP 84/62 mmHg  Pulse 68  Temp(Src) 98 F (36.7 C) (Oral)  Ht 5' 1"  (1.549 m)  Wt 84 lb 4 oz (38.216 kg)  BMI 15.93 kg/m2  SpO2 96% Physical Exam  Constitutional: She is oriented to person, place, and time. She appears cachectic.  Non-toxic appearance. No distress.  HENT:  Head: Normocephalic and atraumatic.  Eyes: Conjunctivae, EOM and lids are normal. Pupils are equal, round, and reactive to light.  Neck: Normal range of motion. Neck supple. No tracheal deviation present. No thyroid mass present.  Cardiovascular: Normal rate, regular rhythm and normal heart sounds.  Exam reveals no gallop.   No murmur heard. Pulmonary/Chest: Effort normal and breath sounds normal. No stridor. No respiratory distress. She has no decreased breath sounds. She has no wheezes. She has no rhonchi. She has no rales.  Abdominal: Soft. Normal appearance and bowel sounds are normal. She exhibits no distension. There is no tenderness. There is no rebound and no CVA tenderness.  Musculoskeletal: Normal range of motion. She exhibits no edema or tenderness.  Neurological: She is alert and oriented to person, place, and time. She displays atrophy. A sensory deficit is present. No cranial nerve deficit. GCS eye subscore is 4. GCS verbal subscore is 2. GCS motor subscore  is 6.  Left lower extremity strength 3/5  Left upper extremity strength 3/5  Skin: Skin is warm and dry. No abrasion and no rash noted.  Psychiatric: She has a normal mood and affect. Her speech is normal and behavior is normal.  Nursing note and vitals reviewed.   ED Course  Procedures (including critical care time) Labs Review Labs Reviewed  CBG MONITORING, ED - Abnormal; Notable for the following:    Glucose-Capillary 111 (*)  All other components within normal limits  URINE CULTURE  URINALYSIS, ROUTINE W REFLEX MICROSCOPIC  CBC WITH DIFFERENTIAL/PLATELET  COMPREHENSIVE METABOLIC PANEL    Imaging Review No results found.   EKG Interpretation   Date/Time:  Saturday April 05 2014 20:48:40 EST Ventricular Rate:  66 PR Interval:  179 QRS Duration: 163 QT Interval:  483 QTC Calculation: 506 R Axis:   56 Text Interpretation:  Sinus rhythm LVH with secondary repolarization  abnormality Prolonged QT interval No significant change since last tracing  Confirmed by Zenia Resides  MD, Marlisa Caridi ([id number]) on 04/05/2014 9:23:26 PM      MDM   Final diagnoses:  Altered mental status   Patient had head CT which was negative for bleed. Troponin is elevated without new EKG changes. We'll admit to her cardiologist.     Leota Jacobsen, MD 04/06/14 0003

## 2014-04-05 NOTE — ED Notes (Signed)
IV access and blood draw attempted x4 without success by 2 separate RNs.

## 2014-04-05 NOTE — ED Notes (Signed)
Patient brought in by EMS. Time last at baseline is unknown. PM staff at assisted living facility discovered patient with right side facial droop. Baseline status is verbal but patient is unable to verbalize anything at this point. C/O pain in right jaw and weaker in left upper extremity.

## 2014-04-06 ENCOUNTER — Inpatient Hospital Stay (HOSPITAL_COMMUNITY): Payer: Medicare Other

## 2014-04-06 DIAGNOSIS — I10 Essential (primary) hypertension: Secondary | ICD-10-CM | POA: Diagnosis present

## 2014-04-06 DIAGNOSIS — Z803 Family history of malignant neoplasm of breast: Secondary | ICD-10-CM | POA: Diagnosis not present

## 2014-04-06 DIAGNOSIS — I2510 Atherosclerotic heart disease of native coronary artery without angina pectoris: Secondary | ICD-10-CM | POA: Diagnosis present

## 2014-04-06 DIAGNOSIS — F419 Anxiety disorder, unspecified: Secondary | ICD-10-CM | POA: Diagnosis present

## 2014-04-06 DIAGNOSIS — J45909 Unspecified asthma, uncomplicated: Secondary | ICD-10-CM | POA: Diagnosis present

## 2014-04-06 DIAGNOSIS — F329 Major depressive disorder, single episode, unspecified: Secondary | ICD-10-CM | POA: Diagnosis present

## 2014-04-06 DIAGNOSIS — Z7951 Long term (current) use of inhaled steroids: Secondary | ICD-10-CM | POA: Diagnosis not present

## 2014-04-06 DIAGNOSIS — R634 Abnormal weight loss: Secondary | ICD-10-CM | POA: Diagnosis present

## 2014-04-06 DIAGNOSIS — F0390 Unspecified dementia without behavioral disturbance: Secondary | ICD-10-CM | POA: Diagnosis present

## 2014-04-06 DIAGNOSIS — G459 Transient cerebral ischemic attack, unspecified: Secondary | ICD-10-CM | POA: Diagnosis present

## 2014-04-06 DIAGNOSIS — R479 Unspecified speech disturbances: Secondary | ICD-10-CM | POA: Diagnosis present

## 2014-04-06 DIAGNOSIS — N390 Urinary tract infection, site not specified: Secondary | ICD-10-CM | POA: Diagnosis present

## 2014-04-06 DIAGNOSIS — I447 Left bundle-branch block, unspecified: Secondary | ICD-10-CM | POA: Diagnosis present

## 2014-04-06 DIAGNOSIS — Z87891 Personal history of nicotine dependence: Secondary | ICD-10-CM | POA: Diagnosis not present

## 2014-04-06 DIAGNOSIS — I69320 Aphasia following cerebral infarction: Secondary | ICD-10-CM | POA: Diagnosis not present

## 2014-04-06 DIAGNOSIS — I5022 Chronic systolic (congestive) heart failure: Secondary | ICD-10-CM | POA: Diagnosis present

## 2014-04-06 DIAGNOSIS — Z79899 Other long term (current) drug therapy: Secondary | ICD-10-CM | POA: Diagnosis not present

## 2014-04-06 DIAGNOSIS — J449 Chronic obstructive pulmonary disease, unspecified: Secondary | ICD-10-CM | POA: Diagnosis present

## 2014-04-06 DIAGNOSIS — G4733 Obstructive sleep apnea (adult) (pediatric): Secondary | ICD-10-CM | POA: Diagnosis present

## 2014-04-06 DIAGNOSIS — Z66 Do not resuscitate: Secondary | ICD-10-CM | POA: Diagnosis present

## 2014-04-06 DIAGNOSIS — Z853 Personal history of malignant neoplasm of breast: Secondary | ICD-10-CM | POA: Diagnosis not present

## 2014-04-06 DIAGNOSIS — F209 Schizophrenia, unspecified: Secondary | ICD-10-CM | POA: Diagnosis present

## 2014-04-06 DIAGNOSIS — R4182 Altered mental status, unspecified: Secondary | ICD-10-CM | POA: Diagnosis present

## 2014-04-06 DIAGNOSIS — I420 Dilated cardiomyopathy: Secondary | ICD-10-CM | POA: Diagnosis present

## 2014-04-06 DIAGNOSIS — I69354 Hemiplegia and hemiparesis following cerebral infarction affecting left non-dominant side: Secondary | ICD-10-CM | POA: Diagnosis not present

## 2014-04-06 LAB — CBC
HEMATOCRIT: 36.7 % (ref 36.0–46.0)
HEMOGLOBIN: 11.8 g/dL — AB (ref 12.0–15.0)
MCH: 30.3 pg (ref 26.0–34.0)
MCHC: 32.2 g/dL (ref 30.0–36.0)
MCV: 94.3 fL (ref 78.0–100.0)
PLATELETS: 209 10*3/uL (ref 150–400)
RBC: 3.89 MIL/uL (ref 3.87–5.11)
RDW: 12.9 % (ref 11.5–15.5)
WBC: 3.1 10*3/uL — AB (ref 4.0–10.5)

## 2014-04-06 LAB — BASIC METABOLIC PANEL
Anion gap: 8 (ref 5–15)
BUN: 12 mg/dL (ref 6–23)
CO2: 26 mmol/L (ref 19–32)
Calcium: 9.4 mg/dL (ref 8.4–10.5)
Chloride: 107 mmol/L (ref 96–112)
Creatinine, Ser: 0.95 mg/dL (ref 0.50–1.10)
GFR calc Af Amer: 69 mL/min — ABNORMAL LOW (ref >90)
GFR calc non Af Amer: 60 mL/min — ABNORMAL LOW (ref >90)
GLUCOSE: 81 mg/dL (ref 70–99)
Potassium: 3.7 mmol/L (ref 3.5–5.1)
Sodium: 141 mmol/L (ref 135–145)

## 2014-04-06 LAB — PROTIME-INR
INR: 1.18 (ref 0.00–1.49)
Prothrombin Time: 15.2 seconds (ref 11.6–15.2)

## 2014-04-06 LAB — MRSA PCR SCREENING: MRSA by PCR: NEGATIVE

## 2014-04-06 MED ORDER — CARVEDILOL 3.125 MG PO TABS: 3.1250 mg | ORAL_TABLET | Freq: Two times a day (BID) | ORAL | Status: DC

## 2014-04-06 MED ORDER — STROKE: EARLY STAGES OF RECOVERY BOOK
Freq: Once | Status: AC
  Administered 2014-04-06: 08:00:00
  Filled 2014-04-06: qty 1

## 2014-04-06 MED ORDER — SODIUM CHLORIDE 0.9 % IV SOLN: 250.0000 mL | INTRAVENOUS | Status: DC | PRN

## 2014-04-06 MED ORDER — SODIUM CHLORIDE 0.9 % IJ SOLN: 3.0000 mL | INTRAMUSCULAR | Status: DC | PRN

## 2014-04-06 MED ORDER — SODIUM CHLORIDE 0.9 % IJ SOLN: 3.0000 mL | Freq: Two times a day (BID) | INTRAMUSCULAR | Status: DC

## 2014-04-06 MED ORDER — HEPARIN SODIUM (PORCINE) 5000 UNIT/ML IJ SOLN: 5000.0000 [IU] | Freq: Three times a day (TID) | INTRAMUSCULAR | Status: DC

## 2014-04-06 MED ORDER — NITROGLYCERIN 0.4 MG SL SUBL: 0.4000 mg | SUBLINGUAL_TABLET | SUBLINGUAL | Status: DC | PRN

## 2014-04-06 MED ORDER — ONDANSETRON HCL 4 MG PO TABS
4.0000 mg | ORAL_TABLET | Freq: Four times a day (QID) | ORAL | Status: DC | PRN
  Administered 2014-04-07: 4 mg via ORAL
  Filled 2014-04-06: qty 1

## 2014-04-06 MED ORDER — DEXTROSE-NACL 5-0.45 % IV SOLN
INTRAVENOUS | Status: DC
  Administered 2014-04-06 – 2014-04-08 (×2): via INTRAVENOUS

## 2014-04-06 MED ORDER — CIPROFLOXACIN IN D5W 200 MG/100ML IV SOLN
200.0000 mg | Freq: Two times a day (BID) | INTRAVENOUS | Status: DC
  Administered 2014-04-06 – 2014-04-08 (×5): 200 mg via INTRAVENOUS
  Filled 2014-04-06 (×6): qty 100

## 2014-04-06 MED ORDER — ACETAMINOPHEN 650 MG RE SUPP: 650.0000 mg | Freq: Four times a day (QID) | RECTAL | Status: DC | PRN

## 2014-04-06 MED ORDER — ALPRAZOLAM 0.25 MG PO TABS: 0.2500 mg | ORAL_TABLET | Freq: Three times a day (TID) | ORAL | Status: DC | PRN

## 2014-04-06 MED ORDER — FUROSEMIDE 40 MG PO TABS: 40.0000 mg | ORAL_TABLET | Freq: Every day | ORAL | Status: DC

## 2014-04-06 MED ORDER — HEPARIN SODIUM (PORCINE) 5000 UNIT/ML IJ SOLN
5000.0000 [IU] | Freq: Three times a day (TID) | INTRAMUSCULAR | Status: DC
  Administered 2014-04-06 – 2014-04-08 (×7): 5000 [IU] via SUBCUTANEOUS
  Filled 2014-04-06 (×7): qty 1

## 2014-04-06 MED ORDER — BUDESONIDE-FORMOTEROL FUMARATE 80-4.5 MCG/ACT IN AERO
2.0000 | INHALATION_SPRAY | Freq: Two times a day (BID) | RESPIRATORY_TRACT | Status: DC
  Administered 2014-04-06 – 2014-04-08 (×4): 2 via RESPIRATORY_TRACT
  Filled 2014-04-06: qty 6.9

## 2014-04-06 MED ORDER — STARCH (THICKENING) PO POWD
ORAL | Status: DC | PRN
  Filled 2014-04-06: qty 227

## 2014-04-06 MED ORDER — ONDANSETRON HCL 4 MG/2ML IJ SOLN: 4.0000 mg | Freq: Four times a day (QID) | INTRAMUSCULAR | Status: DC | PRN

## 2014-04-06 MED ORDER — ALBUTEROL SULFATE (2.5 MG/3ML) 0.083% IN NEBU
2.5000 mg | INHALATION_SOLUTION | Freq: Four times a day (QID) | RESPIRATORY_TRACT | Status: DC | PRN
  Administered 2014-04-06 – 2014-04-07 (×3): 2.5 mg via RESPIRATORY_TRACT
  Filled 2014-04-06 (×3): qty 3

## 2014-04-06 MED ORDER — ADULT MULTIVITAMIN W/MINERALS CH: 1.0000 | ORAL_TABLET | Freq: Every day | ORAL | Status: DC

## 2014-04-06 MED ORDER — ASPIRIN EC 81 MG PO TBEC
81.0000 mg | DELAYED_RELEASE_TABLET | Freq: Every day | ORAL | Status: DC
  Administered 2014-04-07 – 2014-04-08 (×2): 81 mg via ORAL
  Filled 2014-04-06 (×2): qty 1

## 2014-04-06 MED ORDER — PROVENTIL 90 MCG/ACT IN AERS: 2.0000 | INHALATION_SPRAY | Freq: Four times a day (QID) | RESPIRATORY_TRACT | Status: DC | PRN

## 2014-04-06 MED ORDER — QUETIAPINE FUMARATE 25 MG PO TABS: 25.0000 mg | ORAL_TABLET | Freq: Two times a day (BID) | ORAL | Status: DC

## 2014-04-06 MED ORDER — LISINOPRIL 5 MG PO TABS: 5.0000 mg | ORAL_TABLET | Freq: Every day | ORAL | Status: DC

## 2014-04-06 MED ORDER — ATORVASTATIN CALCIUM 10 MG PO TABS: 10.0000 mg | ORAL_TABLET | Freq: Every day | ORAL | Status: DC

## 2014-04-06 MED ORDER — CITALOPRAM HYDROBROMIDE 10 MG PO TABS: 10.0000 mg | ORAL_TABLET | Freq: Every day | ORAL | Status: DC

## 2014-04-06 MED ORDER — ACETAMINOPHEN 325 MG PO TABS
650.0000 mg | ORAL_TABLET | Freq: Four times a day (QID) | ORAL | Status: DC | PRN
Start: 2014-04-06 — End: 2014-04-08
  Administered 2014-04-07: 650 mg via ORAL
  Filled 2014-04-06: qty 2

## 2014-04-06 NOTE — Evaluation (Signed)
Clinical/Bedside Swallow Evaluation Patient Details  Name: [name] MRN: [id number] Date of Birth: [birth year]  Today's Date: 04/06/2014 Time: SLP Start Time (ACUTE ONLY): 5597 SLP Stop Time (ACUTE ONLY): 1211 SLP Time Calculation (min) (ACUTE ONLY): 29 min  Past Medical History:  Past Medical History  Diagnosis Date  . Weight loss, unintentional   . Trouble swallowing   . Change in voice   . Substance abuse   . Dementia   . Asthma   . Shortness of breath   . Seizures     "    It has been along time "  . CHF (congestive heart failure)   . Headache(784.0)   . Hypertension   . Arthralgia   . Tremors of nervous system   . Anxiety   . Rib fractures     hx of May 2012  . Heart attack 05/21/10  . Schizophrenia   . Coronary artery disease   . Stroke 07/11/10    hx of R CVA   . COPD (chronic obstructive pulmonary disease)   . Obstructive sleep apnea   . 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 Schizophrenia   . Depressive disorder    Past Surgical History:  Past Surgical History  Procedure Laterality Date  . Total abdominal hysterectomy  age 21   HPI:  69 year old female with 3 prior strokes with left sided weakness and speech disturbance, dysphagia, severe protein calorie malnutrition, CHF, substance abuse, anxiety, schizophrenia, COPD. hypertension and dementia had altered mental status and aphasia noted by nursing home staff.  UA shows early UTI. CXR negative. CT head negative for acute intracranial abnormality.  Previous MBSS complete 06/21/10, 07/05/10, and 11/26/2012 (most recent recommended puree, nectar thick liquid).    Assessment / Plan / Recommendation Clinical Impression  Bedside swallow evaluation complete. Patient with overt indication of an oropharyngeal dysphagia with decreased oral control of bolus, intermittently limited appearing movement of hyo-laryngeal structure, and overt indication of aspiration with multiple  consistencies. Per sister, patient on a puree diet with thickened liquids (unclear consistency) prior to admission. Given extent of new speech disturbances of questionable origin, h/o dysphagia, and severity of dysphagia noted at bedside, would not feel comfortable placing patient even on a modified diet without instrumental testing. Plan for MBS this pm.     Aspiration Risk       Diet Recommendation NPO   Medication Administration: Via alternative means    Other  Recommendations Recommended Consults: MBS Oral Care Recommendations:  (QID)   Follow Up Recommendations          Pertinent Vitals/Pain n/a        Swallow Study    General HPI: 69 year old female with 3 prior strokes with left sided weakness and speech disturbance, dysphagia, severe protein calorie malnutrition, CHF, substance abuse, anxiety, schizophrenia, COPD. hypertension and dementia had altered mental status and aphasia noted by nursing home staff.  UA shows early UTI. CXR negative. CT head negative for acute intracranial abnormality.  Previous MBSS complete 06/21/10, 07/05/10, and 11/26/2012 (most recent recommended puree, nectar thick liquid).  Type of Study: Bedside swallow evaluation Previous Swallow Assessment: see HPI Diet Prior to this Study: NPO Temperature Spikes Noted: No Respiratory Status: Room air History of Recent Intubation: No Behavior/Cognition: Alert;Agitated;Other (comment) (severely dysarthric, follows commands, frustrated appearing) Oral Cavity - Dentition: Missing dentition Self-Feeding Abilities: Able to feed self Patient Positioning: Upright in bed Baseline Vocal  Quality: Clear Volitional Cough: Weak Volitional Swallow: Able to elicit    Oral/Motor/Sensory Function Overall Oral Motor/Sensory Function: Appears within functional limits for tasks assessed   Ice Chips Ice chips: Not tested   Thin Liquid Thin Liquid: Impaired Presentation: Cup;Self Fed;Straw Pharyngeal  Phase Impairments:  Suspected delayed Swallow;Cough - Immediate    Nectar Thick Nectar Thick Liquid: Not tested   Honey Thick Honey Thick Liquid: Not tested   Puree Puree: Impaired Presentation: Self Fed;Spoon Oral Phase Impairments: Impaired anterior to posterior transit Pharyngeal Phase Impairments:  (intermittently absent appearing laryngeal elevation/excursio)   Solid   GO   Symia Herdt MA, CCC-SLP [phone]  Solid: Impaired Presentation: Self Fed Oral Phase Impairments: Impaired anterior to posterior transit (poor bolus cohesion) Pharyngeal Phase Impairments: Suspected delayed Swallow;Cough - Immediate (suspect oral transit without sufficient mastication)       Franz Svec Meryl 04/06/2014,12:27 PM

## 2014-04-06 NOTE — Procedures (Signed)
Objective Swallowing Evaluation: Modified Barium Swallowing Study  Patient Details  Name: [name] MRN: [id number] Date of Birth: [birth year]  Today's Date: 04/06/2014 Time: SLP Start Time (ACUTE ONLY): 1530-SLP Stop Time (ACUTE ONLY): 1550 SLP Time Calculation (min) (ACUTE ONLY): 20 min  Past Medical History:  Past Medical History  Diagnosis Date  . Weight loss, unintentional   . Trouble swallowing   . Change in voice   . Substance abuse   . Dementia   . Asthma   . Shortness of breath   . Seizures     "    It has been along time "  . CHF (congestive heart failure)   . Headache(784.0)   . Hypertension   . Arthralgia   . Tremors of nervous system   . Anxiety   . Rib fractures     hx of May 2012  . Heart attack 05/21/10  . Schizophrenia   . Coronary artery disease   . Stroke 07/11/10    hx of R CVA   . COPD (chronic obstructive pulmonary disease)   . Obstructive sleep apnea   . 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 Schizophrenia   . Depressive disorder    Past Surgical History:  Past Surgical History  Procedure Laterality Date  . Total abdominal hysterectomy  age 47   HPI:  HPI: 69 year old female with 3 prior strokes with left sided weakness and speech disturbance, dysphagia, severe protein calorie malnutrition, CHF, substance abuse, anxiety, schizophrenia, COPD. hypertension and dementia had altered mental status and aphasia noted by nursing home staff.  UA shows early UTI. CXR negative. CT head negative for acute intracranial abnormality.  Previous MBSS complete 06/21/10, 07/05/10, and 11/26/2012 (most recent recommended puree, nectar thick liquid).   No Data Recorded  Assessment / Plan / Recommendation CHL IP CLINICAL IMPRESSIONS 04/06/2014  Dysphagia Diagnosis Moderate oral phase dysphagia;Mild pharyngeal phase dysphagia  Clinical impression Patient presents with a moderate oral and mild pharyngeal dysphagia. Decreased oral  bolus control noted (despite oral motor exam without focal deficits) leading to delayed swallow reflex, worse with liquids, and trace penetration. With both nectar and thin liquids, penetrates cleared with either cough or subsequent swallows and occurred in less then 10% of boluses trialed. Soft solids NT today due to missing dentition/patient declined. Recommend initiation of conservative diet today given bedside results which indicated increased coughing (? impulsivity that couldnt be matched during today's study). Will attempt to advance liquids as tolerated at bedside.       CHL IP TREATMENT RECOMMENDATION 04/06/2014  Treatment Plan Recommendations Therapy as outlined in treatment plan below     CHL IP DIET RECOMMENDATION 04/06/2014  Diet Recommendations Dysphagia 1 (Puree);Nectar-thick liquid  Liquid Administration via Cup  Medication Administration Crushed with puree  Compensations Slow rate;Small sips/bites  Postural Changes and/or Swallow Maneuvers Seated upright 90 degrees     CHL IP OTHER RECOMMENDATIONS 04/06/2014  Recommended Consults (None)  Oral Care Recommendations Oral care BID  Other Recommendations Order thickener from pharmacy;Prohibited food (jello, ice cream, thin soups);Remove water pitcher     CHL IP FOLLOW UP RECOMMENDATIONS 04/06/2014  Follow up Recommendations Home health SLP     CHL IP FREQUENCY AND DURATION 04/06/2014  Speech Therapy Frequency (ACUTE ONLY) min 2x/week  Treatment Duration 2 weeks         SLP Swallow Goals CHL IP SWALLOW STUDY GOALS 11/26/2012  Patient will consume recommended  diet without observed clinical signs of aspiration with (None)  Swallow Study Goal #1 - Progress (None)  Patient will utilize recommended strategies during swallow to increase swallowing safety with Moderate assistance  Swallow Study Goal #2 - Progress Progressing toward goal  Goal #3 (None)  Swallow Study Goal #3 - Progress (None)  Goal #4 (None)  Swallow Study Goal #4 -  Progress (None)    No flowsheet data found.    CHL IP REASON FOR REFERRAL 04/06/2014  Reason for Referral Objectively evaluate swallowing function     CHL IP ORAL PHASE 04/06/2014  Lips (None)  Tongue (None)  Mucous membranes (None)  Nutritional status (None)  Other (None)  Oxygen therapy (None)  Oral Phase Impaired  Oral - Pudding Teaspoon (None)  Oral - Pudding Cup (None)  Oral - Honey Teaspoon (None)  Oral - Honey Cup (None)  Oral - Honey Syringe (None)  Oral - Nectar Teaspoon (None)  Oral - Nectar Cup Weak lingual manipulation;Lingual pumping;Lingual/palatal residue;Pocketing in anterior sulcus;Delayed oral transit  Oral - Nectar Straw (None)  Oral - Nectar Syringe (None)  Oral - Ice Chips (None)  Oral - Thin Teaspoon (None)  Oral - Thin Cup Weak lingual manipulation;Lingual pumping;Lingual/palatal residue;Pocketing in anterior sulcus;Delayed oral transit  Oral - Thin Straw Weak lingual manipulation;Lingual pumping;Lingual/palatal residue;Pocketing in anterior sulcus;Delayed oral transit  Oral - Thin Syringe (None)  Oral - Puree Weak lingual manipulation;Lingual pumping;Lingual/palatal residue;Pocketing in anterior sulcus;Delayed oral transit  Oral - Mechanical Soft (None)  Oral - Regular (None)  Oral - Multi-consistency (None)  Oral - Pill (None)  Oral Phase - Comment (None)      CHL IP PHARYNGEAL PHASE 04/06/2014  Pharyngeal Phase Impaired  Pharyngeal - Pudding Teaspoon (None)  Penetration/Aspiration details (pudding teaspoon) (None)  Pharyngeal - Pudding Cup (None)  Penetration/Aspiration details (pudding cup) (None)  Pharyngeal - Honey Teaspoon (None)  Penetration/Aspiration details (honey teaspoon) (None)  Pharyngeal - Honey Cup (None)  Penetration/Aspiration details (honey cup) (None)  Pharyngeal - Honey Syringe (None)  Penetration/Aspiration details (honey syringe) (None)  Pharyngeal - Nectar Teaspoon (None)  Penetration/Aspiration details (nectar teaspoon)  (None)  Pharyngeal - Nectar Cup Delayed swallow initiation;Premature spillage to pyriform sinuses;Penetration/Aspiration before swallow  Penetration/Aspiration details (nectar cup) Material enters airway, remains ABOVE vocal cords then ejected out  Pharyngeal - Nectar Straw (None)  Penetration/Aspiration details (nectar straw) (None)  Pharyngeal - Nectar Syringe (None)  Penetration/Aspiration details (nectar syringe) (None)  Pharyngeal - Ice Chips (None)  Penetration/Aspiration details (ice chips) (None)  Pharyngeal - Thin Teaspoon (None)  Penetration/Aspiration details (thin teaspoon) (None)  Pharyngeal - Thin Cup Delayed swallow initiation;Premature spillage to pyriform sinuses  Penetration/Aspiration details (thin cup) Material does not enter airway  Pharyngeal - Thin Straw Delayed swallow initiation;Premature spillage to pyriform sinuses;Penetration/Aspiration during swallow  Penetration/Aspiration details (thin straw) Material enters airway, remains ABOVE vocal cords and not ejected out  Pharyngeal - Thin Syringe (None)  Penetration/Aspiration details (thin syringe') (None)  Pharyngeal - Puree Delayed swallow initiation;Premature spillage to valleculae  Penetration/Aspiration details (puree) (None)  Pharyngeal - Mechanical Soft (None)  Penetration/Aspiration details (mechanical soft) (None)  Pharyngeal - Regular (None)  Penetration/Aspiration details (regular) (None)  Pharyngeal - Multi-consistency (None)  Penetration/Aspiration details (multi-consistency) (None)  Pharyngeal - Pill (None)  Penetration/Aspiration details (pill) (None)  Pharyngeal Comment (None)     CHL IP CERVICAL ESOPHAGEAL PHASE 04/06/2014  Cervical Esophageal Phase WFL  Pudding Teaspoon (None)  Pudding Cup (None)  Honey Teaspoon (None)  Honey Cup (None)  Honey Syringe (  None)  Nectar Teaspoon (None)  Nectar Cup (None)  Nectar Straw (None)  Nectar Syringe (None)  Thin Teaspoon (None)  Thin Cup (None)   Thin Straw (None)  Thin Syringe (None)  Cervical Esophageal Comment (None)    No flowsheet data found. Gabriel Rainwater MA, CCC-SLP [phone]         Gabriel Rainwater Meryl 04/06/2014, 3:59 PM

## 2014-04-06 NOTE — Progress Notes (Signed)
Utilization Review Completed.[name] T2/14/2016  

## 2014-04-06 NOTE — Progress Notes (Signed)
VASCULAR LAB PRELIMINARY  PRELIMINARY  PRELIMINARY  PRELIMINARY  Carotid Dopplers completed.    Preliminary report:  1-39% ICA stenosis.  Vertebral artery flow is antegrade.   Rainier Feuerborn, RVT 04/06/2014, 11:20 AM

## 2014-04-06 NOTE — H&P (Signed)
Referring Physician:  BROOKS STOTZ is an 69 y.o. female.                       Chief Complaint: Altered Mental Status  HPI: 69 year old female with 3 prior strokes with left sided weakness and speech disturbance, severe protein calorie malnutrition, hypertension and dementia had altered mental status and aphasia noted by nursing home staff. Onset of her symptoms are unknown. Answers yes or no with head node. UA shows early UTI. Troponin-I is mildly elevated. EKG is sinus rhythm with LBBB. Prior echocardiogram (05/19/2013) showed poor LV systolic function with 15 % EF. Patient is not a candidate for IV TPA or cardiac interventions.  Past Medical History  Diagnosis Date  . Weight loss, unintentional   . Trouble swallowing   . Change in voice   . Substance abuse   . Dementia   . Asthma   . Shortness of breath   . Seizures     "    It has been along time "  . CHF (congestive heart failure)   . Headache(784.0)   . Hypertension   . Arthralgia   . Tremors of nervous system   . Anxiety   . Rib fractures     hx of May 2012  . Heart attack 05/21/10  . Schizophrenia   . Coronary artery disease   . Stroke 07/11/10    hx of R CVA   . COPD (chronic obstructive pulmonary disease)   . Obstructive sleep apnea   . 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 Schizophrenia   . Depressive disorder       Past Surgical History  Procedure Laterality Date  . Total abdominal hysterectomy  age 53    Family History  Problem Relation Age of Onset  . Breast cancer Mother   . Birth defects Mother     breast  . Cancer Mother     breast  . Heart disease Father     heart attack  . Heart attack Father   . Heart attack Brother   . Cancer Brother     throat, lung  . Cancer Paternal Aunt   . Cancer Maternal Grandmother     breast    Social History:  reports that she quit smoking about 6 years ago. Her smoking use included Cigarettes. She has a 50 pack-year  smoking history. She has never used smokeless tobacco. She reports that she does not drink alcohol or use illicit drugs.  Allergies:  Allergies  Allergen Reactions  . Codeine Itching    All over the body  . Penicillins Hives and Other (See Comments)    "Whelps" per patient.  . Hydralazine     02/28/11 Family unsure if this is allergy for pt     (Not in a hospital admission)  Results for orders placed or performed during the hospital encounter of 04/05/14 (from the past 48 hour(s))  CBG monitoring, ED     Status: Abnormal   Collection Time: 04/05/14  8:39 PM  Result Value Ref Range   Glucose-Capillary 111 (H) 70 - 99 mg/dL  Urinalysis, Routine w reflex microscopic     Status: Abnormal   Collection Time: 04/05/14 10:01 PM  Result Value Ref Range   Color, Urine YELLOW YELLOW   APPearance CLEAR CLEAR   Specific Gravity, Urine 1.013 1.005 - 1.030   pH 6.0 5.0 -  8.0   Glucose, UA NEGATIVE NEGATIVE mg/dL   Hgb urine dipstick NEGATIVE NEGATIVE   Bilirubin Urine NEGATIVE NEGATIVE   Ketones, ur NEGATIVE NEGATIVE mg/dL   Protein, ur NEGATIVE NEGATIVE mg/dL   Urobilinogen, UA 1.0 0.0 - 1.0 mg/dL   Nitrite NEGATIVE NEGATIVE   Leukocytes, UA SMALL (A) NEGATIVE  Urine microscopic-add on     Status: Abnormal   Collection Time: 04/05/14 10:01 PM  Result Value Ref Range   WBC, UA 3-6 <3 WBC/hpf   Bacteria, UA FEW (A) RARE   Urine-Other MUCOUS PRESENT   CBC with Differential/Platelet     Status: Abnormal   Collection Time: 04/05/14 10:36 PM  Result Value Ref Range   WBC 3.7 (L) 4.0 - 10.5 K/uL    Comment: REPEATED TO VERIFY WHITE COUNT CONFIRMED ON SMEAR SPECIMEN CHECKED FOR CLOTS    RBC 4.40 3.87 - 5.11 MIL/uL   Hemoglobin 13.4 12.0 - 15.0 g/dL   HCT 40.4 36.0 - 46.0 %   MCV 91.8 78.0 - 100.0 fL   MCH 30.5 26.0 - 34.0 pg   MCHC 33.2 30.0 - 36.0 g/dL   RDW 12.8 11.5 - 15.5 %   Platelets PLATELET CLUMPS NOTED ON SMEAR, UNABLE TO ESTIMATE 150 - 400 K/uL   Neutrophils Relative % 55  43 - 77 %   Lymphocytes Relative 32 12 - 46 %   Monocytes Relative 9 3 - 12 %   Eosinophils Relative 3 0 - 5 %   Basophils Relative 1 0 - 1 %   Neutro Abs 2.1 1.7 - 7.7 K/uL   Lymphs Abs 1.2 0.7 - 4.0 K/uL   Monocytes Absolute 0.3 0.1 - 1.0 K/uL   Eosinophils Absolute 0.1 0.0 - 0.7 K/uL   Basophils Absolute 0.0 0.0 - 0.1 K/uL   Smear Review MORPHOLOGY UNREMARKABLE   Comprehensive metabolic panel     Status: Abnormal   Collection Time: 04/05/14 10:36 PM  Result Value Ref Range   Sodium 141 135 - 145 mmol/L   Potassium 3.9 3.5 - 5.1 mmol/L   Chloride 104 96 - 112 mmol/L   CO2 31 19 - 32 mmol/L   Glucose, Bld 81 70 - 99 mg/dL   BUN 16 6 - 23 mg/dL   Creatinine, Ser 1.06 0.50 - 1.10 mg/dL   Calcium 9.7 8.4 - 10.5 mg/dL   Total Protein 7.1 6.0 - 8.3 g/dL   Albumin 3.5 3.5 - 5.2 g/dL   AST 22 0 - 37 U/L   ALT 10 0 - 35 U/L   Alkaline Phosphatase 86 39 - 117 U/L   Total Bilirubin 0.4 0.3 - 1.2 mg/dL   GFR calc non Af Amer 52 (L) >90 mL/min   GFR calc Af Amer 61 (L) >90 mL/min    Comment: (NOTE) The eGFR has been calculated using the CKD EPI equation. This calculation has not been validated in all clinical situations. eGFR's persistently <90 mL/min signify possible Chronic Kidney Disease.    Anion gap 6 5 - 15  Troponin I     Status: Abnormal   Collection Time: 04/05/14 10:36 PM  Result Value Ref Range   Troponin I 0.06 (H) <0.031 ng/mL    Comment:        PERSISTENTLY INCREASED TROPONIN VALUES IN THE RANGE OF 0.04-0.49 ng/mL CAN BE SEEN IN:       -UNSTABLE ANGINA       -CONGESTIVE HEART FAILURE       -MYOCARDITIS       -  CHEST TRAUMA       -ARRYHTHMIAS       -LATE PRESENTING MYOCARDIAL INFARCTION       -COPD   CLINICAL FOLLOW-UP RECOMMENDED.    Dg Chest 2 View  04/05/2014   CLINICAL DATA:  69 year old female altered mental status. Initial encounter.  EXAM: CHEST  2 VIEW  COMPARISON:  In been *SCRATCH* the 11/14/2013 and earlier.  FINDINGS: AP and lateral views of the  chest. Stable cardiomegaly and mediastinal contours. Stable lung volumes. Calcified aorta. No pneumothorax, pulmonary edema, pleural effusion or acute pulmonary opacity. Osteopenia. No acute osseous abnormality identified.  IMPRESSION: No acute cardiopulmonary abnormality.   Electronically Signed   By: Genevie Ann M.D.   On: 04/05/2014 22:18   Ct Head Wo Contrast  04/05/2014   CLINICAL DATA:  69 year old female with altered mental status. Initial encounter.  EXAM: CT HEAD WITHOUT CONTRAST  TECHNIQUE: Contiguous axial images were obtained from the base of the skull through the vertex without intravenous contrast.  COMPARISON:  10/15/2013 and earlier  FINDINGS: Stable paranasal sinuses and mastoids. Stable visualized osseous structures. Stable orbit and scalp soft tissues.  Multifocal bilateral vascular territory encephalomalacia compatible with numerous previous large vessel and medium-sized vessel infarcts. Cerebral volume not significantly changed. No ventriculomegaly. No midline shift, mass effect, or evidence of intracranial mass lesion. No acute intracranial hemorrhage identified. No evidence of cortically based acute infarction identified.  IMPRESSION: Severe chronic ischemic disease.  No acute intracranial abnormality.   Electronically Signed   By: Genevie Ann M.D.   On: 04/05/2014 22:09    Review Of Systems Unable to perform  Blood pressure 84/62, pulse 68, temperature 98 F (36.7 C), temperature source Oral, height 5' 1"  (1.549 m), weight 38.216 kg (84 lb 4 oz), SpO2 96 %.  Physical Exam  Constitutional:  She appears cachectic. Non-toxic appearance. No distress.  HENT: Head: Normocephalic and atraumatic. Eyes: Conjunctivae, EOM and lids are normal. Pupils are equal, round, and reactive to light. Tongue- pink and midline. Neck: Normal range of motion. Neck supple. No tracheal deviation present. No thyroid mass present.  Cardiovascular: Normal rate, regular rhythm and normal heart sounds. Exam  reveals no gallop.  II/VI systolic murmur heard. Pulmonary/Chest: Effort normal and breath sounds normal.   Abdominal: Soft. Normal appearance and bowel sounds are normal. There is no tenderness.   Musculoskeletal: Moves all 4 extremities but decreased ability to use left upper and lower extremities. She exhibits no edema or tenderness.  Neurological: She is alert and oriented to person and place. She displays atrophy. A sensory deficit is present. Left upper extremity with mild contracture and strength 2/5 left lower extremity with weakness and 3/5 strength.  Skin: Skin is warm and dry. No abrasion and no rash noted.  Psychiatric: Some what anxious over inability to speak.  Nursing note and vitals reviewed.  Assessment/Plan Altered mental status Aphasia UTI Multiple strokes in past with non-dominant/left sided weakness H/O substance abuse Dementia Asthma Dilated cardiomyopathy Chronic left heart systolic failue Hypertension CAD COPD Depression S/P Breast cancer  Admit. DNR with multiple medical conditions. Home medications.  Birdie Riddle, MD  04/06/2014, 1:06 AM

## 2014-04-07 LAB — COMPREHENSIVE METABOLIC PANEL
ALBUMIN: 3.2 g/dL — AB (ref 3.5–5.2)
ALT: 11 U/L (ref 0–35)
ANION GAP: 4 — AB (ref 5–15)
AST: 20 U/L (ref 0–37)
Alkaline Phosphatase: 79 U/L (ref 39–117)
BILIRUBIN TOTAL: 0.5 mg/dL (ref 0.3–1.2)
BUN: 9 mg/dL (ref 6–23)
CHLORIDE: 106 mmol/L (ref 96–112)
CO2: 29 mmol/L (ref 19–32)
Calcium: 9 mg/dL (ref 8.4–10.5)
Creatinine, Ser: 0.94 mg/dL (ref 0.50–1.10)
GFR calc Af Amer: 70 mL/min — ABNORMAL LOW (ref >90)
GFR calc non Af Amer: 61 mL/min — ABNORMAL LOW (ref >90)
GLUCOSE: 99 mg/dL (ref 70–99)
Potassium: 3.5 mmol/L (ref 3.5–5.1)
Sodium: 139 mmol/L (ref 135–145)
Total Protein: 6.5 g/dL (ref 6.0–8.3)

## 2014-04-07 LAB — URINE CULTURE
COLONY COUNT: NO GROWTH
Culture: NO GROWTH

## 2014-04-07 LAB — LIPID PANEL
CHOLESTEROL: 141 mg/dL (ref 0–200)
HDL: 65 mg/dL (ref >39)
LDL Cholesterol: 65 mg/dL (ref 0–99)
TRIGLYCERIDES: 56 mg/dL (ref <150)
Total CHOL/HDL Ratio: 2.2 RATIO
VLDL: 11 mg/dL (ref 0–40)

## 2014-04-07 LAB — CBC
HCT: 36.3 % (ref 36.0–46.0)
HEMOGLOBIN: 11.9 g/dL — AB (ref 12.0–15.0)
MCH: 30.4 pg (ref 26.0–34.0)
MCHC: 32.8 g/dL (ref 30.0–36.0)
MCV: 92.6 fL (ref 78.0–100.0)
Platelets: 204 10*3/uL (ref 150–400)
RBC: 3.92 MIL/uL (ref 3.87–5.11)
RDW: 12.6 % (ref 11.5–15.5)
WBC: 3.5 10*3/uL — AB (ref 4.0–10.5)

## 2014-04-07 LAB — PROTIME-INR
INR: 1.23 (ref 0.00–1.49)
Prothrombin Time: 15.7 seconds — ABNORMAL HIGH (ref 11.6–15.2)

## 2014-04-07 LAB — HEMOGLOBIN A1C
Hgb A1c MFr Bld: 5.9 % — ABNORMAL HIGH (ref 4.8–5.6)
MEAN PLASMA GLUCOSE: 123 mg/dL

## 2014-04-07 MED ORDER — CLOPIDOGREL BISULFATE 75 MG PO TABS
75.0000 mg | ORAL_TABLET | Freq: Every day | ORAL | Status: DC
  Administered 2014-04-07 – 2014-04-08 (×2): 75 mg via ORAL
  Filled 2014-04-07 (×2): qty 1

## 2014-04-07 NOTE — Progress Notes (Signed)
INITIAL NUTRITION ASSESSMENT  DOCUMENTATION CODES Per approved criteria  -Severe malnutrition in the context of chronic illness -Underweight  Pt meets criteria for severe malnutrition in the context of chronic illness as evidenced by severe body fat depletion and severe muscle mass depletion.  INTERVENTION: Provide Magic Cup BID, each provides 290 kcal, 9 grams of protein  NUTRITION DIAGNOSIS: Malnutrition related to chronic illness as evidenced by severe body fat and muscle mass depletion.   Goal: Pt to meet >/= 90% of estimated energy requirements   Monitor:  PO intake, weight, labs  Reason for Assessment: Pt identified due to Malnutrition Screening Tool   69 y.o. female  Admitting Dx: Altered mental status  ASSESSMENT: 69 y/o female with 3 prior strokes with left sided weakness and speech disturbance, severe protein calorie malnutrition, hypertension and dementia. Altered mental status and aphasia noted by nursing home staff. UA shows early UTI.   Labs and medications reviewed.   Pt ate well this morning; PO intake 75%. Per nurse tech, she seems to be tolerating diet well. Speech was slurred; difficult to obtain intake/weight information upon assessment. Pt denied any recent wt loss; however, wt hx shows she has lost >10% in <1 year.  She prefers [employer] over pudding. Discussed with nurse tech about providing [employer].   Nutrition Focused Physical Exam:  Subcutaneous Fat:  Orbital Region: severe depletion Upper Arm Region: mild depletion Thoracic and Lumbar Region: severe depletion  Muscle:  Temple Region: severe depletion Clavicle Bone Region: severe depletion Clavicle and Acromion Bone Region: severe depletion Scapular Bone Region: severe depletion Dorsal Hand: severe depletion Patellar Region: severe depletion Anterior Thigh Region: severe depletion Posterior Calf Region: severe depletion  Edema: not present  Height: Ht Readings from Last 1 Encounters:   04/07/14 5' 1"  (1.549 m)    Weight: Wt Readings from Last 1 Encounters:  04/07/14 79 lb (35.834 kg)    Ideal Body Weight: 105 lb (47.7kg)  % Ideal Body Weight: 75%  Wt Readings from Last 10 Encounters:  04/07/14 79 lb (35.834 kg)  05/23/13 88 lb 10 oz (40.2 kg)  01/11/13 80 lb (36.288 kg)  12/18/12 82 lb 4.8 oz (37.331 kg)  12/12/12 87 lb 8.4 oz (39.7 kg)  11/24/12 88 lb 2.9 oz (40 kg)  06/24/12 87 lb 6.4 oz (39.644 kg)  10/19/11 97 lb 7.1 oz (44.2 kg)  04/13/11 100 lb 8.5 oz (45.6 kg)  04/06/11 102 lb (46.267 kg)    Usual Body Weight: unknown  % Usual Body Weight: -  BMI:  Body mass index is 14.93 kg/(m^2). underweight  Estimated Nutritional Needs: Kcal: 1253-1350 Protein: 50-60 Fluid: >/= 1.3L daily   Skin: intact  Diet Order: DIET - DYS 1  EDUCATION NEEDS: -No education needs identified at this time   Intake/Output Summary (Last 24 hours) at 04/07/14 1023 Last data filed at 04/07/14 0935  Gross per 24 hour  Intake 1501.17 ml  Output    675 ml  Net 826.17 ml    Last BM: PTA  Labs:   Recent Labs Lab 04/05/14 2236 04/06/14 0400 04/07/14 0345  NA 141 141 139  K 3.9 3.7 3.5  CL 104 107 106  CO2 31 26 29   BUN 16 12 9   CREATININE 1.06 0.95 0.94  CALCIUM 9.7 9.4 9.0  GLUCOSE 81 81 99    CBG (last 3)   Recent Labs  04/05/14 2039  GLUCAP 111*    Scheduled Meds: . aspirin EC  81 mg Oral  Daily  . budesonide-formoterol  2 puff Inhalation BID  . ciprofloxacin  200 mg Intravenous Q12H  . heparin  5,000 Units Subcutaneous 3 times per day  . sodium chloride  3 mL Intravenous Q12H  . sodium chloride  3 mL Intravenous Q12H    Continuous Infusions: . dextrose 5 % and 0.45% NaCl 40 mL/hr at 04/06/14 1308    Past Medical History  Diagnosis Date  . Weight loss, unintentional   . Trouble swallowing   . Change in voice   . Substance abuse   . Dementia   . Asthma   . Shortness of breath   . Seizures     "    It has been along time "   . CHF (congestive heart failure)   . Headache(784.0)   . Hypertension   . Arthralgia   . Tremors of nervous system   . Anxiety   . Rib fractures     hx of May 2012  . Heart attack 05/21/10  . Schizophrenia   . Coronary artery disease   . Stroke 07/11/10    hx of R CVA   . COPD (chronic obstructive pulmonary disease)   . Obstructive sleep apnea   . 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 Schizophrenia   . Depressive disorder     Past Surgical History  Procedure Laterality Date  . Total abdominal hysterectomy  age 80    Wynona Dove, MS Dietetic Intern Pager: [phone]

## 2014-04-07 NOTE — Evaluation (Signed)
Physical Therapy Evaluation Patient Details Name: [name] MRN: [id number] DOB: May 08, 1945 Today's Date: 04/07/2014   History of Present Illness  69 year old female with 3 prior strokes with left sided weakness and speech disturbance, dysphagia, severe protein calorie malnutrition, CHF, substance abuse, anxiety, schizophrenia, COPD, hypertension and dementia. Had altered mental status and aphasia noted by nursing home staff. UA shows early UTI. CXR negative. CT head negative for acute intracranial abnormality.  Clinical Impression  Pt admitted with above diagnosis. Pt currently with functional limitations due to the deficits listed below (see PT Problem List). Pt with mod A +2 for SPT, held LLE off floor throughout. Unclear of her baseline at ALF but per notes in chart from last admission, do not feel that she has had a significant decline in mobility from her baseline. Will follow acutely.  Pt will benefit from skilled PT to increase their independence and safety with mobility to allow discharge to the venue listed below.       Follow Up Recommendations Home health PT;Supervision/Assistance - 24 hour    Equipment Recommendations  None recommended by PT    Recommendations for Other Services OT consult     Precautions / Restrictions Precautions Precautions: Fall Precaution Comments: hemiparetic left side Restrictions Weight Bearing Restrictions: No      Mobility  Bed Mobility Overal bed mobility: Modified Independent             General bed mobility comments: pt able to get to sitting EOB with the RUE/ LE as well as return to supine  Transfers Overall transfer level: Needs assistance Equipment used: None Transfers: Sit to/from [facility] Sit to Stand: +2 physical assistance;Mod assist Stand pivot transfers: +2 physical assistance;Mod assist       General transfer comment: pt held left foot off floor throughout transfer, was able to lift right  heel to pivot on right foot, +2 assist for support and to control sit to chair. Pt attempted to "throw" self into chair without control indicating increased fall risk  Ambulation/Gait             General Gait Details: pt seemed to indicate that she could walk but given the lack of use of LLE during transfer, this therapist did not feel that his was safe currently  Stairs            Wheelchair Mobility    Modified Rankin (Stroke Patients Only)       Balance Overall balance assessment: Needs assistance Sitting-balance support: No upper extremity supported;Feet supported Sitting balance-Leahy Scale: Good     Standing balance support: Bilateral upper extremity supported;During functional activity Standing balance-Leahy Scale: Poor Standing balance comment: pt unable to maintain standing on RLE only                             Pertinent Vitals/Pain Pain Assessment: Faces Faces Pain Scale: Hurts even more Pain Location: left shoulder with movement Pain Intervention(s): Heat applied  O2 sats 96% on 2L O2    Home Living Family/patient expects to be discharged to:: Assisted living               Home Equipment: Bedside commode;Walker - 2 wheels;Cane - single point;Shower seat      Prior Function Level of Independence: Needs assistance   Gait / Transfers Assistance Needed: pt nods "yes" to being able to walk some but apparently left hospital last time at transfer level  to w/c  ADL's / Homemaking Assistance Needed: needs assist for bathing and dressing  Comments: difficult to obtain PLOF as pt with no clear speech but more, grunting and hand gestures. Per chart, pt with dementia so also question STM.      Hand Dominance   Dominant Hand: Right    Extremity/Trunk Assessment   Upper Extremity Assessment: Defer to OT evaluation;LUE deficits/detail       LUE Deficits / Details: pt shakes her head that she cannot use LUE but when tested, pt actually  has grip strength left close to right   Lower Extremity Assessment: RLE deficits/detail;LLE deficits/detail RLE Deficits / Details: WFL LLE Deficits / Details: left knee very stiff, cannot bend or straighten it actively more than about 10 degrees. Pt held left foot off floor throughout SPT, not using it at all. When therapist tried to passively range left knee, pt pulled away and nodded that knee hurts with movement.   Cervical / Trunk Assessment: Kyphotic  Communication   Communication: Expressive difficulties  Cognition Arousal/Alertness: Awake/alert Behavior During Therapy: Restless Overall Cognitive Status: Difficult to assess                      General Comments      Exercises        Assessment/Plan    PT Assessment Patient needs continued PT services  PT Diagnosis Difficulty walking;Hemiplegia non-dominant side;Altered mental status   PT Problem List Decreased strength;Decreased range of motion;Decreased activity tolerance;Decreased balance;Decreased mobility;Decreased coordination;Decreased safety awareness;Decreased cognition;Decreased knowledge of precautions  PT Treatment Interventions DME instruction;Gait training;Functional mobility training;Therapeutic activities;Therapeutic exercise;Balance training;Neuromuscular re-education;Cognitive remediation;Patient/family education   PT Goals (Current goals can be found in the Care Plan section) Acute Rehab PT Goals Patient Stated Goal: none stated PT Goal Formulation: With patient Time For Goal Achievement: 04/21/14 Potential to Achieve Goals: Good    Frequency Min 3X/week   Barriers to discharge        Co-evaluation               End of Session Equipment Utilized During Treatment: Gait belt;Oxygen Activity Tolerance: Patient tolerated treatment well Patient left: in chair;with chair alarm set;with nursing/sitter in room;with call bell/phone within reach Nurse Communication: Mobility status          Time: 1237-1257 PT Time Calculation (min) (ACUTE ONLY): 20 min   Charges:   PT Evaluation $Initial PT Evaluation Tier I: 1 Procedure     PT G Codes:       Leighton Roach, PT  Acute Rehab Services  [phone]  Leighton Roach 04/07/2014, 1:33 PM

## 2014-04-07 NOTE — Progress Notes (Signed)
Subjective:  Awake denies any complaints speech remained slurred.  Objective:  Vital Signs in the last 24 hours: Temp:  [97.7 F (36.5 C)-98 F (36.7 C)] 98 F (36.7 C) (02/15 0837) Pulse Rate:  [62-72] 72 (02/15 0837) Resp:  [18-25] 22 (02/15 0837) BP: (112-132)/(74-96) 132/96 mmHg (02/15 0837) SpO2:  [94 %-100 %] 97 % (02/15 0837) Weight:  [35.834 kg (79 lb)] 35.834 kg (79 lb) (02/15 0400)  Intake/Output from previous day: 02/14 0701 - 02/15 0700 In: 1261.2 [P.O.:40; I.V.:1021.2; IV Piggyback:200] Out: 475 [Urine:475] Intake/Output from this shift: Total I/O In: 240 [P.O.:240] Out: 200 [Urine:200]  Physical Exam: Neck: no adenopathy, no carotid bruit, no JVD and supple, symmetrical, trachea midline Lungs: clear to auscultation bilaterally Heart: regular rate and rhythm and S1 and S2 soft Abdomen: soft, non-tender; bowel sounds normal; no masses,  no organomegaly Extremities: extremities normal, atraumatic, no cyanosis or edema  Lab Results:  Recent Labs  04/06/14 0400 04/07/14 0345  WBC 3.1* 3.5*  HGB 11.8* 11.9*  PLT 209 204    Recent Labs  04/06/14 0400 04/07/14 0345  NA 141 139  K 3.7 3.5  CL 107 106  CO2 26 29  GLUCOSE 81 99  BUN 12 9  CREATININE 0.95 0.94    Recent Labs  04/05/14 2236  TROPONINI 0.06*   Hepatic Function Panel  Recent Labs  04/07/14 0345  PROT 6.5  ALBUMIN 3.2*  AST 20  ALT 11  ALKPHOS 79  BILITOT 0.5    Recent Labs  04/07/14 0345  CHOL 141   No results for input(s): PROTIME in the last 72 hours.  Imaging: Imaging results have been reviewed and Dg Chest 2 View  04/05/2014   CLINICAL DATA:  69 year old female altered mental status. Initial encounter.  EXAM: CHEST  2 VIEW  COMPARISON:  In been *SCRATCH* the 11/14/2013 and earlier.  FINDINGS: AP and lateral views of the chest. Stable cardiomegaly and mediastinal contours. Stable lung volumes. Calcified aorta. No pneumothorax, pulmonary edema, pleural effusion or  acute pulmonary opacity. Osteopenia. No acute osseous abnormality identified.  IMPRESSION: No acute cardiopulmonary abnormality.   Electronically Signed   By: Genevie Ann M.D.   On: 04/05/2014 22:18   Ct Head Wo Contrast  04/05/2014   CLINICAL DATA:  69 year old female with altered mental status. Initial encounter.  EXAM: CT HEAD WITHOUT CONTRAST  TECHNIQUE: Contiguous axial images were obtained from the base of the skull through the vertex without intravenous contrast.  COMPARISON:  10/15/2013 and earlier  FINDINGS: Stable paranasal sinuses and mastoids. Stable visualized osseous structures. Stable orbit and scalp soft tissues.  Multifocal bilateral vascular territory encephalomalacia compatible with numerous previous large vessel and medium-sized vessel infarcts. Cerebral volume not significantly changed. No ventriculomegaly. No midline shift, mass effect, or evidence of intracranial mass lesion. No acute intracranial hemorrhage identified. No evidence of cortically based acute infarction identified.  IMPRESSION: Severe chronic ischemic disease.  No acute intracranial abnormality.   Electronically Signed   By: Genevie Ann M.D.   On: 04/05/2014 22:09   Dg Swallowing Func-speech Pathology  04/06/2014   Earle Gell McCoy, CCC-SLP     04/06/2014  4:00 PM  Objective Swallowing Evaluation: Modified Barium Swallowing Study   Patient Details  Name: [name] MRN: [id number] Date of Birth: 09/11/1945  Today's Date: 04/06/2014 Time: SLP Start Time (ACUTE ONLY): 1530-SLP Stop Time (ACUTE  ONLY): 1550 SLP Time Calculation (min) (ACUTE ONLY): 20 min  Past Medical History:  Past Medical History  Diagnosis Date  . Weight loss, unintentional   . Trouble swallowing   . Change in voice   . Substance abuse   . Dementia   . Asthma   . Shortness of breath   . Seizures     "    It has been along time "  . CHF (congestive heart failure)   . Headache(784.0)   . Hypertension   . Arthralgia   . Tremors of nervous system   . Anxiety   . Rib  fractures     hx of May 2012  . Heart attack 05/21/10  . Schizophrenia   . Coronary artery disease   . Stroke 07/11/10    hx of R CVA   . COPD (chronic obstructive pulmonary disease)   . Obstructive sleep apnea   . 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 Schizophrenia   . Depressive disorder    Past Surgical History:  Past Surgical History  Procedure Laterality Date  . Total abdominal hysterectomy  age 43   HPI:  HPI: 69 year old female with 3 prior strokes with left sided  weakness and speech disturbance, dysphagia, severe protein  calorie malnutrition, CHF, substance abuse, anxiety,  schizophrenia, COPD. hypertension and dementia had altered mental  status and aphasia noted by nursing home staff.  UA shows early  UTI. CXR negative. CT head negative for acute intracranial  abnormality.  Previous MBSS complete 06/21/10, 07/05/10, and  11/26/2012 (most recent recommended puree, nectar thick liquid).   No Data Recorded  Assessment / Plan / Recommendation CHL IP CLINICAL IMPRESSIONS 04/06/2014  Dysphagia Diagnosis Moderate oral phase dysphagia;Mild pharyngeal  phase dysphagia  Clinical impression Patient presents with a moderate oral and  mild pharyngeal dysphagia. Decreased oral bolus control noted  (despite oral motor exam without focal deficits) leading to  delayed swallow reflex, worse with liquids, and trace  penetration. With both nectar and thin liquids, penetrates  cleared with either cough or subsequent swallows and occurred in  less then 10% of boluses trialed. Soft solids NT today due to  missing dentition/patient declined. Recommend initiation of  conservative diet today given bedside results which indicated  increased coughing (? impulsivity that couldnt be matched during  today's study). Will attempt to advance liquids as tolerated at  bedside.       CHL IP TREATMENT RECOMMENDATION 04/06/2014  Treatment Plan Recommendations Therapy as outlined in treatment  plan below     CHL IP  DIET RECOMMENDATION 04/06/2014  Diet Recommendations Dysphagia 1 (Puree);Nectar-thick liquid  Liquid Administration via Cup  Medication Administration Crushed with puree  Compensations Slow rate;Small sips/bites  Postural Changes and/or Swallow Maneuvers Seated upright 90  degrees     CHL IP OTHER RECOMMENDATIONS 04/06/2014  Recommended Consults (None)  Oral Care Recommendations Oral care BID  Other Recommendations Order thickener from pharmacy;Prohibited  food (jello, ice cream, thin soups);Remove water pitcher     CHL IP FOLLOW UP RECOMMENDATIONS 04/06/2014  Follow up Recommendations Home health SLP     CHL IP FREQUENCY AND DURATION 04/06/2014  Speech Therapy Frequency (ACUTE ONLY) min 2x/week  Treatment Duration 2 weeks         SLP Swallow Goals CHL IP SWALLOW STUDY GOALS 11/26/2012  Patient will consume recommended diet without observed clinical  signs of aspiration with (None)  Swallow Study Goal #1 - Progress (None)  Patient will utilize recommended strategies during swallow to  increase swallowing safety  with Moderate assistance  Swallow Study Goal #2 - Progress Progressing toward goal  Goal #3 (None)  Swallow Study Goal #3 - Progress (None)  Goal #4 (None)  Swallow Study Goal #4 - Progress (None)    No flowsheet data found.    CHL IP REASON FOR REFERRAL 04/06/2014  Reason for Referral Objectively evaluate swallowing function     CHL IP ORAL PHASE 04/06/2014  Lips (None)  Tongue (None)  Mucous membranes (None)  Nutritional status (None)  Other (None)  Oxygen therapy (None)  Oral Phase Impaired  Oral - Pudding Teaspoon (None)  Oral - Pudding Cup (None)  Oral - Honey Teaspoon (None)  Oral - Honey Cup (None)  Oral - Honey Syringe (None)  Oral - Nectar Teaspoon (None)  Oral - Nectar Cup Weak lingual manipulation;Lingual  pumping;Lingual/palatal residue;Pocketing in anterior  sulcus;Delayed oral transit  Oral - Nectar Straw (None)  Oral - Nectar Syringe (None)  Oral - Ice Chips (None)  Oral - Thin Teaspoon (None)  Oral -  Thin Cup Weak lingual manipulation;Lingual  pumping;Lingual/palatal residue;Pocketing in anterior  sulcus;Delayed oral transit  Oral - Thin Straw Weak lingual manipulation;Lingual  pumping;Lingual/palatal residue;Pocketing in anterior  sulcus;Delayed oral transit  Oral - Thin Syringe (None)  Oral - Puree Weak lingual manipulation;Lingual  pumping;Lingual/palatal residue;Pocketing in anterior  sulcus;Delayed oral transit  Oral - Mechanical Soft (None)  Oral - Regular (None)  Oral - Multi-consistency (None)  Oral - Pill (None)  Oral Phase - Comment (None)      CHL IP PHARYNGEAL PHASE 04/06/2014  Pharyngeal Phase Impaired  Pharyngeal - Pudding Teaspoon (None)  Penetration/Aspiration details (pudding teaspoon) (None)  Pharyngeal - Pudding Cup (None)  Penetration/Aspiration details (pudding cup) (None)  Pharyngeal - Honey Teaspoon (None)  Penetration/Aspiration details (honey teaspoon) (None)  Pharyngeal - Honey Cup (None)  Penetration/Aspiration details (honey cup) (None)  Pharyngeal - Honey Syringe (None)  Penetration/Aspiration details (honey syringe) (None)  Pharyngeal - Nectar Teaspoon (None)  Penetration/Aspiration details (nectar teaspoon) (None)  Pharyngeal - Nectar Cup Delayed swallow initiation;Premature  spillage to pyriform sinuses;Penetration/Aspiration before  swallow  Penetration/Aspiration details (nectar cup) Material enters  airway, remains ABOVE vocal cords then ejected out  Pharyngeal - Nectar Straw (None)  Penetration/Aspiration details (nectar straw) (None)  Pharyngeal - Nectar Syringe (None)  Penetration/Aspiration details (nectar syringe) (None) Pharyngeal  - Ice Chips (None)  Penetration/Aspiration details (ice chips) (None)  Pharyngeal - Thin Teaspoon (None)  Penetration/Aspiration details (thin teaspoon) (None)  Pharyngeal - Thin Cup Delayed swallow initiation;Premature  spillage to pyriform sinuses  Penetration/Aspiration details (thin cup) Material does not enter  airway  Pharyngeal - Thin  Straw Delayed swallow initiation;Premature  spillage to pyriform sinuses;Penetration/Aspiration during  swallow  Penetration/Aspiration details (thin straw) Material enters  airway, remains ABOVE vocal cords and not ejected out  Pharyngeal - Thin Syringe (None)  Penetration/Aspiration details (thin syringe') (None)  Pharyngeal - Puree Delayed swallow initiation;Premature spillage  to valleculae  Penetration/Aspiration details (puree) (None)  Pharyngeal - Mechanical Soft (None)  Penetration/Aspiration details (mechanical soft) (None)  Pharyngeal - Regular (None)  Penetration/Aspiration details (regular) (None)  Pharyngeal - Multi-consistency (None)  Penetration/Aspiration details (multi-consistency) (None)  Pharyngeal - Pill (None)  Penetration/Aspiration details (pill) (None) Pharyngeal Comment  (None)     CHL IP CERVICAL ESOPHAGEAL PHASE 04/06/2014  Cervical Esophageal Phase WFL  Pudding Teaspoon (None)  Pudding Cup (None)  Honey Teaspoon (None)  Honey Cup (None)  Honey Syringe (None)  Nectar Teaspoon (None)  Nectar Cup (None)  Nectar Straw (  None)  Nectar Syringe (None)  Thin Teaspoon (None)  Thin Cup (None)  Thin Straw (None)  Thin Syringe (None)  Cervical Esophageal Comment (None)    No flowsheet data found. Gabriel Rainwater MA, CCC-SLP [phone]         Gabriel Rainwater Meryl 04/06/2014, 3:59 PM     Cardiac Studies:  Assessment/Plan:  Status post possible TIA History of multiple strokes in the past UTI Severe nonischemic dilated cardiomyopathy Chronic left artery failure Hypertension COPD Pressure History of CA of breast History of cocaine abuse Dementia Severe protein calorie malnutrition Plan Add Plavix as per orders OT PT consult Prognosis grave Aspiration DO NOT RESUSCITATE will not pursue any further workup  LOS: 1 day    Montavious Wierzba N 04/07/2014, 11:37 AM

## 2014-04-08 MED ORDER — CIPROFLOXACIN HCL 250 MG PO TABS: 250.0000 mg | ORAL_TABLET | Freq: Two times a day (BID) | ORAL | Status: AC

## 2014-04-08 MED ORDER — MORPHINE SULFATE 2 MG/ML IJ SOLN
1.0000 mg | Freq: Four times a day (QID) | INTRAMUSCULAR | Status: DC | PRN
  Administered 2014-04-08 (×2): 1 mg via INTRAVENOUS
  Filled 2014-04-08 (×2): qty 1

## 2014-04-08 MED ORDER — CLOPIDOGREL BISULFATE 75 MG PO TABS: 75.0000 mg | ORAL_TABLET | Freq: Every day | ORAL | Status: AC

## 2014-04-08 NOTE — Clinical Social Work Psychosocial (Signed)
     Clinical Social Work Department BRIEF PSYCHOSOCIAL ASSESSMENT 04/08/2014  Patient:  [name], [name]     Account Number:  [id number]     Admit date:  04/05/2014  Clinical Social Worker:  Adair Laundry  Date/Time:  04/08/2014 10:42 AM  Referred by:  Physician  Date Referred:  04/08/2014 Referred for  ALF Placement   Other Referral:   Interview type:  Family Other interview type:    PSYCHOSOCIAL DATA Living Status:  FACILITY Admitted from facility:  [facility] Level of care:  Assisted Living Primary support name:  [name] Primary support relationship to patient:  SIBLING Degree of support available:   Pt has strong family support    CURRENT CONCERNS Current Concerns  Post-Acute Placement   Other Concerns:    SOCIAL WORK ASSESSMENT / PLAN CSW notified pt was admitted from [facility]. CSW attempted to call pt niece listed in chart. CSW was able to reach pt sister [name] but Ms. Clark unable to hear CSW over phone and CSW unable to complete assessment. [name] only able to confirm pt admitted from [facility]. CSW called pt sister [name] and completed full assessment.  [name] informed CSW that pt has been at [facility] for about 2 years. She expressed that at baseline pt does not walk or complete ADL independently except feeding. CSW inquired if family has any concerns at facility or if they have considered transitioning to higher level of care. [name] expressed that family does not feel as though [facility] is providing to pt what she needs. Family does feel that transitioning pt to long term SNF would be better. [name] informed CSW that Bailey Blue is pt HCPOA but as long as pt stays in [place] she would be agreeable to plan. CSW will attemptto communicate again with Bailey Blue but likely will not be able to communicate all of the above because of hearing issues.  [name] informed CSW that pt has been to [facility] in the past and family was  very pleased with the facility for ST care and feel their long term care will be a good fit for pt as well. [name] is agreeale to referral being sent to all [facility] as a backup.  [name] seems to have a limited but good understanding of pt condition. She asked appropriate questions and asked for update from nursing staff. CSW relayed this to pt nurse.   Assessment/plan status:  Psychosocial Support/Ongoing Assessment of Needs Other assessment/ plan:   Information/referral to community resources:   SNF list to be provided with bed offers    PATIENTS/FAMILYS RESPONSE TO PLAN OF CARE: Pt family pleasant and coopeartive. They feel as though pt will benefit more in long term SNF.      [place], [place]

## 2014-04-08 NOTE — Progress Notes (Signed)
Pt has been discharged. Pt family did not want to wait for transportation to pick pt up. So family choose to take pt to [facility] health care. Report has been called to RN.

## 2014-04-08 NOTE — Discharge Summary (Signed)
[name], [name] NO.:  [id number]  MEDICAL RECORD NO.:  [id number]  LOCATION:  3W23C                        FACILITY:  [facility]  PHYSICIAN:  Allegra Lai. Terrence Dupont, M.D. DATE OF BIRTH:  [birth year]  DATE OF ADMISSION:  04/05/2014 DATE OF DISCHARGE:  04/08/2014                              DISCHARGE SUMMARY   ADMITTING DIAGNOSES: 1. Altered mental status.  Rule out sepsis, aphasia, urinary tract     infection. 2. 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 3. History of cocaine abuse. 4. Dementia. 5. Asthma. 6. Dilated cardiomyopathy. 7. Chronic left systolic heart failure. 8. Hypertension. 9. Coronary artery disease. 10.Chronic obstructive pulmonary disease. 11.Depression. 12.History of cancer of the breast.  FINAL DIAGNOSES: 1. Status post possible transient ischemic attack. 2. History of multiple cerebrovascular accidents in the past with left-     sided weakness. 3. Resolving urinary tract infection. 4. Severe nonischemic dilated cardiomyopathy. 5. Chronic systolic left heart failure. 6. Hypertension. 7. Chronic obstructive pulmonary disease. 8. History of cancer of breast. 9. History of cocaine abuse. 10.Dementia. 11.Severe protein-calorie malnutrition.  DISCHARGE HOME MEDICATIONS: 1. Ciprofloxacin 250 mg one tablet twice daily for 5 more days. 2. Clopidogrel 75 mg one tablet daily. 3. Xanax 0.25 mg three times daily as needed for anxiety. 4. Atorvastatin 10 mg daily. 5. Symbicort 2 puffs twice daily. 6. Carvedilol 3.125 mg twice daily. 7. Citalopram 10 mg daily. 8. Furosemide 40 mg daily. 9. Lisinopril 5 mg daily. 10.Melatonin 5 mg daily at bedtime. 11.Multivitamin with mineral 1 tablet daily. 12.Nitrostat sublingual p.r.n. 13.Zofran 4 mg p.r.n. for nausea as before. 14.Proventil inhaler 2 puffs every 6 hours as needed as before. 15.Seroquel 25 mg two times daily.  DIET:  Low salt, low cholesterol as tolerated.  CONDITION AT  DISCHARGE:  Stable.  The patient is DNR.  DISPOSITION:  The patient will be transferred to [facility].  Condition at discharge is stable.  BRIEF HISTORY AND HOSPITAL COURSE:  Ms. Couts Is a 69 year old female with past medical history significant for multiple medical problems, i.e., severe nonischemic dilated cardiomyopathy, history of recurrent congestive heart failure in the past, history of CVA x3 in the past, seizure disorder, COPD, history of CA of breast, schizophrenia, depression, dementia, severe protein-calorie malnutrition, who was admitted by Dr. Doylene Canard as the patient was noted to have altered mental status and aphasia by the nursing staff.  Onset of her symptoms was unknown, answers yes unknown with head nodding.  Urinalysis showed early UTI.  Troponin was minimally elevated.  EKG showed sinus rhythm with left bundle-branch block.  She had prior echo in March of 2015, which showed EF of approximately 15%.  The patient was not a candidate for IV tPA or cardiac intervention.  The patient is DNR.  PHYSICAL EXAMINATION:  VITAL SIGNS:  Her blood pressure was 84/52, pulse was 68.  She was afebrile. GENERAL:  She was cachectic, nontoxic-appearance. HEENT:  Head was normocephalic and atraumatic.  Pupils were equal, round, and reactive to light.  Tongue was pink and midline. NECK:  Supple.  Normal range.  No tracheal deviation.  No thyroid mass present. CARDIOVASCULAR:  S1, S2 was normal.  There  was 2/6 systolic murmur. LUNGS:  Clear to auscultation. ABDOMEN:  Soft.  No tenderness. EXTREMITIES:  She was able to move all extremities, but decreased ability to use left upper and lower extremities.  There was no evidence of edema or tenderness. NEUROLOGIC:  She was alert and oriented to person and place.  She had sensory deficit present in the left upper extremity with mild contracture, strength 2/5, and the left lower extremity with weakness and 3/5  strength.  LABORATORY DATA:  Sodium was 141, potassium 3.9, BUN 16, creatinine 1.06.  Troponin I was 0.06.  Hemoglobin was 13.4, hematocrit 40.4, white count of 3.7.  Her hemoglobin A1c was 5.9, cholesterol was 141, triglycerides 56, HDL 65, LDL 55.  Urine culture showed no growths. There was small leukocytes in urinalysis and few bacteria.  BRIEF HOSPITAL COURSE:  The patient was admitted to telemetry unit.  The patient was felt not the candidate for any further interventions.  The patient had CT of the brain, which showed severe chronic ischemic disease, no acute intracranial abnormality was noted.  The patient was started on IV Cipro for UTI, which was switched to p.o. [name] mental status is at baseline.  The patient will be transferred to skilled nursing facility on above home medications in addition to ciprofloxacin for 5 days and clopidogrel 1 tablet daily. The patient will be followed up in my office in 2 weeks or as appropriate.     Allegra Lai. Terrence Dupont, M.D.     MNH/MEDQ  D:  04/08/2014  T:  04/08/2014  Job:  [id number]

## 2014-04-08 NOTE — Progress Notes (Signed)
CSW (Clinical Education officer, museum) received call from [facility] and they confirmed they can accept pt. CSW prepared pt dc packet and placed with shadow chart. CSW arranged non-emergent ambulance transport. Pt, pt family, pt nurse, and facility informed. CSW signing off.   [place], [place]

## 2014-04-08 NOTE — Progress Notes (Signed)
PT Note Spoke with Percell Miller, OT as well as Hubert Azure, charge nurse on unit about change of pts' d/c plan.  According to Sheppard Evens, pt does not have the level of assistance of 2 persons at the A living that she was residing in and is presently requiring 2 persons per PT note yesterday. Amy saw pt today and concurs that pt would benefit from SNF.  Will change recommendation to SNF as this seems more appropriate for pt at this time.  Will continue to see pt 2-3 x week.  Thanks.   [place] [phone] (pager)

## 2014-04-08 NOTE — Discharge Instructions (Signed)

## 2014-04-08 NOTE — Progress Notes (Signed)
CSW (Clinical Education officer, museum) spoke with Starleen Blue and updated on conversation with Te[place]y. As expected, CSW unable to fully speak about ALF vs SNF but Starleen Blue was able to confirm she is agreeable to pt discharging to SNF today. She was also able to confirm [facility] as first choice. CSW will ask SNF to speak with Starleen Blue in person to explain plan for long term care. Katy Apo also agreeable to do so when she is able to speak with Starleen Blue.  [facility], family first choice facility, is currently confirming pt Medicaid status. They are medically able to accept pt but awaiting confirmation of insurance check before bed offer is provided.  [place], [place]

## 2014-04-08 NOTE — Progress Notes (Deleted)
This patient is receiving the antibiotic(s) ciprofloxacin by the intravenous route. Based on criteria approved by the Pharmacy and Therapeutics Committee, and the Infectious Disease Division, the antibiotic(s) is / are being converted to equivalent oral dose form(s).   These criteria include: . Patient being treated for a respiratory tract infection, urinary tract infection, cellulitis, or Clostridium Difficile Associated Diarrhea . The patient is not neutropenic and does not exhibit a GI malabsorption state . The patient is eating (either orally or per tube) and/or has been taking other orally administered medications for at least 24 hours. . The patient is improving clinically (physician assessment or a 24-hour Tmax of ?<100.5? F).  If you have any questions, please contact the pharmacy department. Thanks!   Verna Czech, PharmD, BCPS

## 2014-04-08 NOTE — Discharge Summary (Signed)
Prior to discharge summary dictated on 04/08/2014 dictation number is [phone]

## 2014-04-08 NOTE — Clinical Social Work Placement (Signed)
     Clinical Social Work Department CLINICAL SOCIAL WORK PLACEMENT NOTE 04/08/2014  Patient:  [name], [name]  Account Number:  [id number] Admit date:  04/05/2014  Clinical Social Worker:  Adair Laundry  Date/time:  04/08/2014 10:49 AM  Clinical Social Work is seeking post-discharge placement for this patient at the following level of care:   SKILLED NURSING   (*CSW will update this form in Epic as items are completed)   04/08/2014  Patient/family provided with [place] Department of Clinical Social Works list of facilities offering this level of care within the geographic area requested by the patient (or if unable, by the patients family).  04/08/2014  Patient/family informed of their freedom to choose among providers that offer the needed level of care, that participate in Medicare, Medicaid or managed care program needed by the patient, have an available bed and are willing to accept the patient.  04/08/2014  Patient/family informed of [facility] ownership interest in [facility], as well as of the fact that they are under no obligation to receive care at this facility.  PASARR submitted to EDS on 04/08/2014 PASARR number received on 04/08/2014  FL2 transmitted to all facilities in geographic area requested by pt/family on  04/08/2014 FL2 transmitted to all facilities within larger geographic area on   Patient informed that his/her managed care company has contracts with or will negotiate with  certain facilities, including the following:     Patient/family informed of bed offers received:   Patient chooses bed at  Physician recommends and patient chooses bed at    Patient to be transferred to  on   Patient to be transferred to facility by  Patient and family notified of transfer on  Name of family member notified:    The following physician request were entered in Epic: Physician Request  Please sign FL2.    Additional CommentsBerton Mount, [place]

## 2014-04-08 NOTE — Progress Notes (Signed)
Medicare Important Message given? YES (If response is "NO", the following Medicare IM given date fields will be blank) Date Medicare IM given:04/08/2014 Medicare IM given by: Whitman Hero

## 2014-04-08 NOTE — Evaluation (Signed)
Occupational Therapy Evaluation Patient Details Name: [name] MRN: [id number] DOB: 10-28-1945 Today's Date: 04/08/2014    History of Present Illness 69 year old female with 3 prior strokes with left sided weakness and speech disturbance, dysphagia, severe protein calorie malnutrition, CHF, substance abuse, anxiety, schizophrenia, COPD, hypertension and dementia. Had altered mental status and aphasia noted by nursing home staff. UA shows early UTI. CXR negative. CT head negative for acute intracranial abnormality.   Clinical Impression   Pt presents w/ deficits in ability to perform ADL and functional transfers related to ADL's she should benefit from acute OT to assist in addressing deficits related to OT problem list (see below), followed by SNF Rehab to assist w/ maximizing independence w/ ADL's. Pt mainly communicates through Y/N and grunting/pointing. Per RN, family is agreeable to SNF. Pt was educated in role of OT and goals and nodded head in understanding.    Follow Up Recommendations  SNF;Supervision/Assistance - 24 hour    Equipment Recommendations  Other (comment) (Defer to next venue)    Recommendations for Other Services       Precautions / Restrictions Precautions Precautions: Fall Precaution Comments: hemiparetic left side Restrictions Weight Bearing Restrictions: No      Mobility Bed Mobility Overal bed mobility: Modified Independent             General bed mobility comments: pt able to get to sitting EOB with the RUE/ LE as well as return to supine  Transfers Overall transfer level: Needs assistance Equipment used: None Transfers: Sit to/from [facility] Sit to Stand: +2 physical assistance;Mod assist Stand pivot transfers: +2 physical assistance;Mod assist       General transfer comment: Pt held left foot off floor throughout transfer, was able to lift right heel to pivot on right foot, +2 assist for support and to control  sit to on 3:1 at bedside. Did not bear weight through LLE    Balance Overall balance assessment: Needs assistance Sitting-balance support: No upper extremity supported;Feet supported Sitting balance-Leahy Scale: Good     Standing balance support: Bilateral upper extremity supported;During functional activity Standing balance-Leahy Scale: Poor Standing balance comment: Pt unable to maintain standing on RLE only                            ADL Overall ADL's : Needs assistance/impaired Eating/Feeding:  (Per nursing, pt able to feed herslef after set up at ALF)   Grooming: Wash/dry hands;Wash/dry face;Bed level;Minimal assistance   Upper Body Bathing: Sitting;Cueing for safety;Cueing for sequencing;Moderate assistance   Lower Body Bathing: Maximal assistance;Sit to/from stand;+2 for physical assistance;+2 for safety/equipment   Upper Body Dressing : Moderate assistance;Sitting   Lower Body Dressing: Moderate assistance;+2 for physical assistance;Sit to/from stand;+2 for safety/equipment   Toilet Transfer: Stand-pivot;BSC;Cueing for safety;Cueing for sequencing Toilet Transfer Details (indicate cue type and reason): Pt does not bear weight through LLE noted, able to SPT w/ second person for safety/equipment Toileting- Clothing Manipulation and Hygiene: Maximal assistance;+2 for physical assistance;Sit to/from stand;Cueing for safety;Cueing for sequencing       Functional mobility during ADLs: Moderate assistance;+2 for safety/equipment (For sit to stand and SPT to 3:1 today at bedside. Pt does not bear weight through LLE noted.) General ADL Comments: Pt presents w/ deficits in ability to perform ADL and functional transfers related to ADL's she should benefit from SNF Rehab to assist w/ maximizing independence w/ ADL's. Pt mainly communicates through Y/N and grunting/pointing.  Per RN, family is agreeable to SNF. Pt was educated in role of OT and goals and nodded head in  understanding.     Vision  Difficult to assess secondary to communication impairment. No glasses in room at time of assessment.   Perception     Praxis      Pertinent Vitals/Pain       Hand Dominance Right   Extremity/Trunk Assessment Upper Extremity Assessment Upper Extremity Assessment: Generalized weakness;LUE deficits/detail LUE Deficits / Details: pt shakes her head that she cannot use LUE but when tested, pt actually has grip strength left close to right LUE Coordination: decreased fine motor;decreased gross motor (See note above; h/o CVA w/ left sided weakness)   Lower Extremity Assessment Lower Extremity Assessment: Defer to PT evaluation   Cervical / Trunk Assessment Cervical / Trunk Assessment: Kyphotic   Communication Communication Communication: Expressive difficulties   Cognition Arousal/Alertness: Awake/alert Behavior During Therapy: Restless Overall Cognitive Status: Difficult to assess                     General Comments       Exercises       Shoulder Instructions      Home Living Family/patient expects to be discharged to:: Assisted living                             Home Equipment: Bedside commode;Walker - 2 wheels;Cane - single point;Shower seat          Prior Functioning/Environment Level of Independence: Needs assistance  Gait / Transfers Assistance Needed: pt nods "yes" to being able to walk some but apparently left hospital last time at transfer level to w/c ADL's / Homemaking Assistance Needed: needs assist for bathing and dressing, she feeds herself after set up Communication / Swallowing Assistance Needed: dysarthria, nectar thick liquids Comments: difficult to obtain PLOF as pt with no clear speech but more, grunting and hand gestures. Per chart, pt with dementia so also question STM.     OT Diagnosis: Generalized weakness;Cognitive deficits   OT Problem List: Decreased strength;Decreased activity  tolerance;Impaired balance (sitting and/or standing);Decreased knowledge of precautions;Decreased knowledge of use of DME or AE;Impaired UE functional use;Other (comment) (Impaired communication & impaired LLE use - does not bear weight LLE.)   OT Treatment/Interventions: Self-care/ADL training;Therapeutic exercise;DME and/or AE instruction;Patient/family education;Therapeutic activities;Balance training    OT Goals(Current goals can be found in the care plan section) Acute Rehab OT Goals Patient Stated Goal: none stated Time For Goal Achievement: 04/22/14 Potential to Achieve Goals: Good  OT Frequency: Min 2X/week   Barriers to D/C:            Co-evaluation              End of Session Nurse Communication: Mobility status;Other (comment) (Recommendations for SNF Rehab)  Activity Tolerance: Patient tolerated treatment well Patient left: in bed;with call bell/phone within reach   Time: 1026-1039 OT Time Calculation (min): 13 min Charges:  OT General Charges $OT Visit: 1 Procedure OT Evaluation $Initial OT Evaluation Tier I: 1 Procedure G-Codes:    Almyra Deforest, OTR/L 04/08/2014, 10:58 AM

## 2014-09-22 DEATH — deceased
